# Patient Record
Sex: Female | Born: 1955 | Race: Black or African American | Hispanic: No | Smoking: Current every day smoker
Health system: Southern US, Community
[De-identification: ages and names within clinical notes are randomized; demographics above are authoritative.]

## PROBLEM LIST (undated history)

## (undated) DIAGNOSIS — R918 Other nonspecific abnormal finding of lung field: Secondary | ICD-10-CM

## (undated) DIAGNOSIS — Z951 Presence of aortocoronary bypass graft: Secondary | ICD-10-CM

## (undated) DIAGNOSIS — N1832 Chronic kidney disease, stage 3b: Secondary | ICD-10-CM

## (undated) DIAGNOSIS — E119 Type 2 diabetes mellitus without complications: Secondary | ICD-10-CM

## (undated) DIAGNOSIS — I10 Essential (primary) hypertension: Secondary | ICD-10-CM

## (undated) DIAGNOSIS — D649 Anemia, unspecified: Secondary | ICD-10-CM

## (undated) DIAGNOSIS — Z9289 Personal history of other medical treatment: Secondary | ICD-10-CM

## (undated) DIAGNOSIS — I509 Heart failure, unspecified: Secondary | ICD-10-CM

## (undated) DIAGNOSIS — I779 Disorder of arteries and arterioles, unspecified: Secondary | ICD-10-CM

## (undated) DIAGNOSIS — F32A Depression, unspecified: Secondary | ICD-10-CM

## (undated) DIAGNOSIS — G459 Transient cerebral ischemic attack, unspecified: Secondary | ICD-10-CM

## (undated) DIAGNOSIS — F329 Major depressive disorder, single episode, unspecified: Secondary | ICD-10-CM

## (undated) DIAGNOSIS — I272 Pulmonary hypertension, unspecified: Secondary | ICD-10-CM

## (undated) DIAGNOSIS — I2510 Atherosclerotic heart disease of native coronary artery without angina pectoris: Secondary | ICD-10-CM

## (undated) DIAGNOSIS — J189 Pneumonia, unspecified organism: Secondary | ICD-10-CM

## (undated) DIAGNOSIS — E7800 Pure hypercholesterolemia, unspecified: Secondary | ICD-10-CM

## (undated) DIAGNOSIS — T7840XA Allergy, unspecified, initial encounter: Secondary | ICD-10-CM

## (undated) DIAGNOSIS — J42 Unspecified chronic bronchitis: Secondary | ICD-10-CM

## (undated) HISTORY — DX: Other nonspecific abnormal finding of lung field: R91.8

## (undated) HISTORY — DX: Disorder of arteries and arterioles, unspecified: I77.9

## (undated) HISTORY — DX: Transient cerebral ischemic attack, unspecified: G45.9

## (undated) HISTORY — PX: CHOLECYSTECTOMY: SHX55

## (undated) HISTORY — DX: Pulmonary hypertension, unspecified: I27.20

## (undated) HISTORY — DX: Chronic kidney disease, stage 3b: N18.32

## (undated) HISTORY — DX: Allergy, unspecified, initial encounter: T78.40XA

---

## 1987-04-20 HISTORY — PX: TUBAL LIGATION: SHX77

## 1999-09-26 ENCOUNTER — Encounter: Payer: Self-pay | Admitting: Emergency Medicine

## 1999-09-26 ENCOUNTER — Emergency Department (HOSPITAL_COMMUNITY): Admission: EM | Admit: 1999-09-26 | Discharge: 1999-09-26 | Payer: Self-pay | Admitting: Emergency Medicine

## 1999-12-18 ENCOUNTER — Other Ambulatory Visit: Admission: RE | Admit: 1999-12-18 | Discharge: 1999-12-18 | Payer: Self-pay | Admitting: *Deleted

## 2001-01-08 ENCOUNTER — Emergency Department (HOSPITAL_COMMUNITY): Admission: EM | Admit: 2001-01-08 | Discharge: 2001-01-08 | Payer: Self-pay | Admitting: Emergency Medicine

## 2001-02-01 ENCOUNTER — Other Ambulatory Visit: Admission: RE | Admit: 2001-02-01 | Discharge: 2001-02-01 | Payer: Self-pay | Admitting: *Deleted

## 2001-06-08 ENCOUNTER — Emergency Department (HOSPITAL_COMMUNITY): Admission: EM | Admit: 2001-06-08 | Discharge: 2001-06-08 | Payer: Self-pay | Admitting: Emergency Medicine

## 2001-06-08 ENCOUNTER — Encounter: Payer: Self-pay | Admitting: Emergency Medicine

## 2001-11-20 ENCOUNTER — Encounter: Payer: Self-pay | Admitting: Emergency Medicine

## 2001-11-20 ENCOUNTER — Emergency Department (HOSPITAL_COMMUNITY): Admission: EM | Admit: 2001-11-20 | Discharge: 2001-11-20 | Payer: Self-pay | Admitting: Emergency Medicine

## 2001-12-13 ENCOUNTER — Encounter: Payer: Self-pay | Admitting: Pulmonary Disease

## 2001-12-13 ENCOUNTER — Encounter: Admission: RE | Admit: 2001-12-13 | Discharge: 2001-12-13 | Payer: Self-pay | Admitting: Pulmonary Disease

## 2002-04-05 ENCOUNTER — Other Ambulatory Visit: Admission: RE | Admit: 2002-04-05 | Discharge: 2002-04-05 | Payer: Self-pay | Admitting: Obstetrics and Gynecology

## 2003-04-20 DIAGNOSIS — Z9289 Personal history of other medical treatment: Secondary | ICD-10-CM

## 2003-04-20 HISTORY — DX: Personal history of other medical treatment: Z92.89

## 2003-05-07 ENCOUNTER — Other Ambulatory Visit: Admission: RE | Admit: 2003-05-07 | Discharge: 2003-05-07 | Payer: Self-pay | Admitting: Obstetrics and Gynecology

## 2004-02-17 ENCOUNTER — Inpatient Hospital Stay (HOSPITAL_COMMUNITY): Admission: EM | Admit: 2004-02-17 | Discharge: 2004-02-24 | Payer: Self-pay | Admitting: Family Medicine

## 2004-02-18 DIAGNOSIS — I2510 Atherosclerotic heart disease of native coronary artery without angina pectoris: Secondary | ICD-10-CM

## 2004-02-18 DIAGNOSIS — Z951 Presence of aortocoronary bypass graft: Secondary | ICD-10-CM

## 2004-02-18 HISTORY — DX: Atherosclerotic heart disease of native coronary artery without angina pectoris: I25.10

## 2004-02-18 HISTORY — DX: Presence of aortocoronary bypass graft: Z95.1

## 2004-02-18 HISTORY — PX: CORONARY ARTERY BYPASS GRAFT: SHX141

## 2004-02-19 ENCOUNTER — Encounter (INDEPENDENT_AMBULATORY_CARE_PROVIDER_SITE_OTHER): Payer: Self-pay | Admitting: *Deleted

## 2004-02-21 DIAGNOSIS — I2510 Atherosclerotic heart disease of native coronary artery without angina pectoris: Secondary | ICD-10-CM | POA: Diagnosis present

## 2004-02-29 ENCOUNTER — Emergency Department (HOSPITAL_COMMUNITY): Admission: AD | Admit: 2004-02-29 | Discharge: 2004-02-29 | Payer: Self-pay | Admitting: Family Medicine

## 2004-03-16 ENCOUNTER — Encounter (HOSPITAL_COMMUNITY): Admission: RE | Admit: 2004-03-16 | Discharge: 2004-06-14 | Payer: Self-pay | Admitting: Cardiology

## 2004-03-20 ENCOUNTER — Encounter: Admission: RE | Admit: 2004-03-20 | Discharge: 2004-03-20 | Payer: Self-pay | Admitting: Cardiothoracic Surgery

## 2004-06-15 ENCOUNTER — Encounter (HOSPITAL_COMMUNITY): Admission: RE | Admit: 2004-06-15 | Discharge: 2004-08-17 | Payer: Self-pay | Admitting: Cardiology

## 2004-06-18 ENCOUNTER — Other Ambulatory Visit: Admission: RE | Admit: 2004-06-18 | Discharge: 2004-06-18 | Payer: Self-pay | Admitting: Obstetrics and Gynecology

## 2004-11-23 ENCOUNTER — Encounter: Admission: RE | Admit: 2004-11-23 | Discharge: 2004-11-23 | Payer: Self-pay | Admitting: Pulmonary Disease

## 2005-03-04 ENCOUNTER — Other Ambulatory Visit: Admission: RE | Admit: 2005-03-04 | Discharge: 2005-03-04 | Payer: Self-pay | Admitting: Obstetrics and Gynecology

## 2005-03-30 ENCOUNTER — Encounter: Admission: RE | Admit: 2005-03-30 | Discharge: 2005-03-30 | Payer: Self-pay | Admitting: Gastroenterology

## 2005-04-21 ENCOUNTER — Other Ambulatory Visit: Admission: RE | Admit: 2005-04-21 | Discharge: 2005-04-21 | Payer: Self-pay | Admitting: Obstetrics and Gynecology

## 2005-05-12 ENCOUNTER — Encounter (INDEPENDENT_AMBULATORY_CARE_PROVIDER_SITE_OTHER): Payer: Self-pay | Admitting: *Deleted

## 2005-05-12 ENCOUNTER — Ambulatory Visit (HOSPITAL_COMMUNITY): Admission: RE | Admit: 2005-05-12 | Discharge: 2005-05-12 | Payer: Self-pay | Admitting: Gastroenterology

## 2006-08-24 ENCOUNTER — Emergency Department (HOSPITAL_COMMUNITY): Admission: EM | Admit: 2006-08-24 | Discharge: 2006-08-24 | Payer: Self-pay | Admitting: *Deleted

## 2006-09-19 ENCOUNTER — Other Ambulatory Visit: Admission: RE | Admit: 2006-09-19 | Discharge: 2006-09-19 | Payer: Self-pay | Admitting: Obstetrics and Gynecology

## 2007-01-11 ENCOUNTER — Encounter: Admission: RE | Admit: 2007-01-11 | Discharge: 2007-01-11 | Payer: Self-pay | Admitting: Pulmonary Disease

## 2007-08-02 ENCOUNTER — Emergency Department (HOSPITAL_COMMUNITY): Admission: EM | Admit: 2007-08-02 | Discharge: 2007-08-03 | Payer: Self-pay | Admitting: Emergency Medicine

## 2008-04-19 HISTORY — PX: CARDIAC CATHETERIZATION: SHX172

## 2008-08-14 ENCOUNTER — Encounter: Admission: RE | Admit: 2008-08-14 | Discharge: 2008-08-14 | Payer: Self-pay | Admitting: Gastroenterology

## 2008-11-11 ENCOUNTER — Emergency Department (HOSPITAL_COMMUNITY): Admission: EM | Admit: 2008-11-11 | Discharge: 2008-11-11 | Payer: Self-pay | Admitting: Emergency Medicine

## 2008-11-12 ENCOUNTER — Observation Stay (HOSPITAL_COMMUNITY): Admission: EM | Admit: 2008-11-12 | Discharge: 2008-11-13 | Payer: Self-pay | Admitting: Emergency Medicine

## 2009-08-24 ENCOUNTER — Emergency Department (HOSPITAL_COMMUNITY): Admission: EM | Admit: 2009-08-24 | Discharge: 2009-08-24 | Payer: Self-pay | Admitting: Emergency Medicine

## 2009-11-21 ENCOUNTER — Emergency Department (HOSPITAL_COMMUNITY): Admission: EM | Admit: 2009-11-21 | Discharge: 2009-11-21 | Payer: Self-pay | Admitting: Emergency Medicine

## 2010-03-25 ENCOUNTER — Ambulatory Visit: Payer: Self-pay | Admitting: Obstetrics & Gynecology

## 2010-04-23 ENCOUNTER — Ambulatory Visit
Admission: RE | Admit: 2010-04-23 | Discharge: 2010-04-23 | Payer: Self-pay | Source: Home / Self Care | Attending: Obstetrics and Gynecology | Admitting: Obstetrics and Gynecology

## 2010-04-24 ENCOUNTER — Encounter: Payer: Self-pay | Admitting: Physician Assistant

## 2010-04-24 LAB — CONVERTED CEMR LAB
Clue Cells Wet Prep HPF POC: NONE SEEN
Trich, Wet Prep: NONE SEEN

## 2010-04-29 ENCOUNTER — Ambulatory Visit (HOSPITAL_COMMUNITY)
Admission: RE | Admit: 2010-04-29 | Discharge: 2010-04-29 | Payer: Self-pay | Source: Home / Self Care | Attending: Family Medicine | Admitting: Family Medicine

## 2010-05-09 ENCOUNTER — Encounter: Payer: Self-pay | Admitting: Cardiothoracic Surgery

## 2010-05-10 ENCOUNTER — Encounter: Payer: Self-pay | Admitting: Pulmonary Disease

## 2010-07-07 LAB — POCT I-STAT, CHEM 8
BUN: 12 mg/dL (ref 6–23)
Calcium, Ion: 1.24 mmol/L (ref 1.12–1.32)
Chloride: 104 mEq/L (ref 96–112)
Creatinine, Ser: 0.8 mg/dL (ref 0.4–1.2)
Glucose, Bld: 304 mg/dL — ABNORMAL HIGH (ref 70–99)
HCT: 40 % (ref 36.0–46.0)
Hemoglobin: 13.6 g/dL (ref 12.0–15.0)
Potassium: 4.6 mEq/L (ref 3.5–5.1)
Sodium: 137 mEq/L (ref 135–145)
TCO2: 26 mmol/L (ref 0–100)

## 2010-07-07 LAB — DIFFERENTIAL
Basophils Absolute: 0 10*3/uL (ref 0.0–0.1)
Basophils Relative: 0 % (ref 0–1)
Eosinophils Absolute: 0.2 10*3/uL (ref 0.0–0.7)
Eosinophils Relative: 2 % (ref 0–5)
Lymphocytes Relative: 27 % (ref 12–46)
Lymphs Abs: 2.1 10*3/uL (ref 0.7–4.0)
Monocytes Absolute: 0.5 10*3/uL (ref 0.1–1.0)
Monocytes Relative: 6 % (ref 3–12)
Neutro Abs: 5 10*3/uL (ref 1.7–7.7)
Neutrophils Relative %: 64 % (ref 43–77)

## 2010-07-07 LAB — CBC
HCT: 36.8 % (ref 36.0–46.0)
Hemoglobin: 12.2 g/dL (ref 12.0–15.0)
MCHC: 33.2 g/dL (ref 30.0–36.0)
MCV: 87.2 fL (ref 78.0–100.0)
Platelets: 320 10*3/uL (ref 150–400)
RBC: 4.22 MIL/uL (ref 3.87–5.11)
RDW: 15.8 % — ABNORMAL HIGH (ref 11.5–15.5)
WBC: 7.8 10*3/uL (ref 4.0–10.5)

## 2010-07-26 LAB — POCT I-STAT, CHEM 8
Chloride: 102 mEq/L (ref 96–112)
Creatinine, Ser: 0.7 mg/dL (ref 0.4–1.2)
HCT: 40 % (ref 36.0–46.0)
Hemoglobin: 13.6 g/dL (ref 12.0–15.0)
Potassium: 4.4 mEq/L (ref 3.5–5.1)
Sodium: 136 mEq/L (ref 135–145)

## 2010-07-26 LAB — CBC
HCT: 38.3 % (ref 36.0–46.0)
HCT: 39 % (ref 36.0–46.0)
Hemoglobin: 12.1 g/dL (ref 12.0–15.0)
Hemoglobin: 12.4 g/dL (ref 12.0–15.0)
Hemoglobin: 12.6 g/dL (ref 12.0–15.0)
MCHC: 31.9 g/dL (ref 30.0–36.0)
MCHC: 32.9 g/dL (ref 30.0–36.0)
MCHC: 33.1 g/dL (ref 30.0–36.0)
MCV: 87.1 fL (ref 78.0–100.0)
MCV: 87.8 fL (ref 78.0–100.0)
MCV: 87.9 fL (ref 78.0–100.0)
Platelets: 294 K/uL (ref 150–400)
Platelets: 321 10*3/uL (ref 150–400)
RBC: 4.2 MIL/uL (ref 3.87–5.11)
RBC: 4.36 MIL/uL (ref 3.87–5.11)
RBC: 4.44 MIL/uL (ref 3.87–5.11)
RDW: 14.8 % (ref 11.5–15.5)
RDW: 14.9 % (ref 11.5–15.5)
WBC: 5.2 K/uL (ref 4.0–10.5)
WBC: 7.4 10*3/uL (ref 4.0–10.5)
WBC: 7.5 10*3/uL (ref 4.0–10.5)

## 2010-07-26 LAB — POCT CARDIAC MARKERS
CKMB, poc: <1 ng/mL — ABNORMAL LOW (ref 1.0–8.0)
Myoglobin, poc: 72.6 ng/mL (ref 12–200)
Troponin i, poc: <0.05 ng/mL (ref 0.00–0.09)

## 2010-07-26 LAB — DIFFERENTIAL
Basophils Absolute: 0 10*3/uL (ref 0.0–0.1)
Basophils Absolute: 0 K/uL (ref 0.0–0.1)
Basophils Relative: 1 % (ref 0–1)
Basophils Relative: 1 % (ref 0–1)
Eosinophils Absolute: 0.1 K/uL (ref 0.0–0.7)
Eosinophils Relative: 3 % (ref 0–5)
Lymphocytes Relative: 31 % (ref 12–46)
Lymphocytes Relative: 36 % (ref 12–46)
Lymphs Abs: 1.9 K/uL (ref 0.7–4.0)
Monocytes Absolute: 0.3 K/uL (ref 0.1–1.0)
Monocytes Absolute: 0.4 10*3/uL (ref 0.1–1.0)
Monocytes Relative: 6 % (ref 3–12)
Monocytes Relative: 6 % (ref 3–12)
Neutro Abs: 2.8 K/uL (ref 1.7–7.7)
Neutro Abs: 4.4 10*3/uL (ref 1.7–7.7)
Neutrophils Relative %: 55 % (ref 43–77)
Neutrophils Relative %: 60 % (ref 43–77)

## 2010-07-26 LAB — LIPID PANEL
HDL: 60 mg/dL
Total CHOL/HDL Ratio: 2.6 ratio
Triglycerides: 167 mg/dL — ABNORMAL HIGH
VLDL: 33 mg/dL (ref 0–40)

## 2010-07-26 LAB — D-DIMER, QUANTITATIVE

## 2010-07-26 LAB — CARDIAC PANEL(CRET KIN+CKTOT+MB+TROPI): Total CK: 66 U/L (ref 7–177)

## 2010-07-26 LAB — GLUCOSE, CAPILLARY
Glucose-Capillary: 125 mg/dL — ABNORMAL HIGH (ref 70–99)
Glucose-Capillary: 142 mg/dL — ABNORMAL HIGH (ref 70–99)
Glucose-Capillary: 151 mg/dL — ABNORMAL HIGH (ref 70–99)

## 2010-07-26 LAB — COMPREHENSIVE METABOLIC PANEL
ALT: 17 U/L (ref 0–35)
AST: 21 U/L (ref 0–37)
BUN: 8 mg/dL (ref 6–23)
Calcium: 9.4 mg/dL (ref 8.4–10.5)
Creatinine, Ser: 0.57 mg/dL (ref 0.4–1.2)
GFR calc non Af Amer: >60 mL/min (ref >60)
Glucose, Bld: 190 mg/dL — ABNORMAL HIGH (ref 70–99)
Potassium: 4.1 mEq/L (ref 3.5–5.1)
Total Bilirubin: 0.4 mg/dL (ref 0.3–1.2)
Total Protein: 6.8 g/dL (ref 6.0–8.3)

## 2010-07-26 LAB — CK TOTAL AND CKMB (NOT AT ARMC)
CK, MB: 0.8 ng/mL (ref 0.3–4.0)
Relative Index: INVALID (ref 0.0–2.5)
Total CK: 60 U/L (ref 7–177)

## 2010-07-26 LAB — PROTIME-INR
INR: 0.9 (ref 0.00–1.49)
Prothrombin Time: 12 seconds (ref 11.6–15.2)

## 2010-07-26 LAB — TROPONIN I: Troponin I: 0.02 ng/mL (ref 0.00–0.06)

## 2010-07-26 LAB — APTT: aPTT: 34 seconds (ref 24–37)

## 2010-07-26 LAB — TSH: TSH: 1.209 u[IU]/mL (ref 0.350–4.500)

## 2010-09-01 NOTE — Cardiovascular Report (Signed)
NAME:  Deborah Coffey, Deborah Coffey NO.:  [id number]   MEDICAL RECORD NO.:  [id number]          PATIENT TYPE:  INP   LOCATION:  2852                         FACILITY:  [facility]   PHYSICIAN:  Nicki Guadalajara, M.D.     DATE OF BIRTH:  February 23, 1956   DATE OF PROCEDURE:  11/13/2008  DATE OF DISCHARGE:  11/13/2008                            CARDIAC CATHETERIZATION   PROCEDURE:  Cardiac catheterization.   SURGEON:  Nicki Guadalajara, MD   INDICATIONS:  Ms. Deborah Coffey is a 55 year old African American  female who apparently underwent single-vessel CABG revascularization  surgery in November 2005 with a LIMA to the LAD.  At that time, cardiac  catheterization was done by Dr. Clarene Duke and the patient was found to have  high-grade LAD disease in the region of a large diagonal takeoff.  The  patient has a history of hypertension, diabetes mellitus, obesity, and  hyperlipidemia.  She has had recurrent episodes of somewhat atypical  chest pain.  She had been sent home with chest pain several days ago.  A  D-dimer was 1.1.  A chest CT was negative.  Troponins were negative.  She had recurrent episodes of chest pain leading to readmission to [facility] yesterday.  Because of recurrence of discomfort, she was  seen by Dr. Mariah Milling and definitive cardiac catheterization was  recommended.   PROCEDURE IN DETAIL:  After premedication with Versed 2 mg and fentanyl  50 mcg, the patient was prepped and draped in usual fashion.  A right  femoral artery was punctured anteriorly and a 5-French sheath was  inserted.  Diagnostic cardiac catheterization was done utilizing 5-  French catheters.  For optimal visualization of the left system, an FL-  3.5 catheter was necessary.  A 5-French FR-4 right catheter was used for  selective angiography in the right coronary artery.  A LIMA catheter was  used for selective angiography into the left internal mammary artery.  A  5-French pigtail catheter was used  for biplane cine left  ventriculography.  With the patient's significant hypertensive history,  distal aortography was also performed to make certain that she did not  have any renovascular etiology to her hypertension.  Hemostasis was  obtained by direct manual pressure.   HEMODYNAMIC DATA:  Central aortic pressure was 120/50.  Left ventricular  pressure was 120/7.   ANGIOGRAPHIC DATA:  Left main coronary artery was a short upper takeoff  vessel which bifurcated into an LAD and left circumflex system.  The LAD  was a large vessel that gave rise to a large proximal diagonal vessel.  In the region of the diagonal takeoff of the LAD just beyond this was,  was smooth and appeared 30% narrowed.  Apparently, this was a site which  previously was reported to be 95% stenosed.  A flush and fill phenomena  was seen in the distal LAD due to the competitive filling from the LIMA  graft.   The circumflex vessel was a moderate-sized vessel that gave rise to two  marginal vessels and ended in the posterolateral vessel.  The circumflex  was angiographically normal.   The right  coronary artery was a moderate-sized vessel that gave rise to  a moderate-sized PDA system and small distal RCA.  There was 40-50%  narrowing in the midportion of the right coronary artery.  Following 200  mcg of intracoronary nitroglycerin, this did not significantly improve.   The left internal mammary artery was a large-caliber vessel proximally.  A flush and fill phenomenon was seen in the distal portion of the LIMA  graft and because of this, visualization of the distal LAD was not  visualized by the LIMA graft but was visualized by the native left  injection.   Left ventriculography revealed normal LV contractility without focal  segmental wall motion abnormalities.  There was a suggestion of mild  concentric left ventricle hypertrophy.   Distal aortography did not demonstrate any significant aortoiliac  disease.   The renal arteries were patent without significant  abnormality.   IMPRESSION:  1. Normal left ventricular function with suggestion of mild concentric      left ventricular hypertrophy.  2. Mild native coronary obstructive disease with improvement in the      previous 95% proximal left anterior descending stenosis in the      region of the diagonal takeoff which now appeared only 30% with a      flush and fill phenomena seen in the distal left anterior      descending due to competitive left internal mammary artery filling.  3. Normal left circumflex system.  4. A 40-50% narrowing in the mid right coronary artery, not      significantly improved with intracoronary nitroglycerin.  5. Patent left internal mammary artery graft but evidence for flush      and fill phenomena seen in the distal graft without adequate      visualization of the distal left anterior descending via this graft      due to predominantly native left anterior descending filling.  6. Normal aortoiliac system.   RECOMMENDATIONS:  Medical therapy.           ______________________________  Nicki Guadalajara, M.D.     TK/MEDQ  D:  11/13/2008  T:  11/13/2008  Job:  [id number]   cc:   Mina Marble, M.D.  Ritta Slot, MD

## 2010-09-01 NOTE — H&P (Signed)
NAME:  Deborah Coffey, NAVEJAS NO.:  [id number]   MEDICAL RECORD NO.:  [id number]          PATIENT TYPE:  EMS   LOCATION:  MAJO                         FACILITY:  [facility]   PHYSICIAN:  Nicki Guadalajara, M.D.     DATE OF BIRTH:  14-Mar-1956   DATE OF ADMISSION:  11/12/2008  DATE OF DISCHARGE:                              HISTORY & PHYSICAL   CHIEF COMPLAINTS:  Chest pain.   Ms. Posada is a 55 year old female followed by Dr. Lynnea Ferrier and Dr.  Petra Kuba.  She has a history of coronary disease.  She had an urgent  bypass surgery times one November 2005, with an LIMA to her LAD.  Her  only functional study since then was an echocardiogram, which was done  in March 2009, which showed essentially normal LV function.  She does  have other risk factors including dyslipidemia, smoking, hypertension  and diabetes.  She was seen in the emergency room last night for chest  pain, which was atypical.  It was localized to her left chest.  It was  worse with palpation and deep breathing.  Her D-dimer was slightly  elevated at 1.0 and she had a CT scan which was negative for pulmonary  embolism.  Her troponins were negative x2.  She was treated with pain  medication and discharged from the emergency room.  She called the  office later today, saying she was still having left-sided chest pain  but also had some vague chest tightness.  She denies any shortness of  breath or diaphoresis.  She came back to the emergency room tonight  after calling our office.   CURRENT MEDICATIONS:  1. Lisinopril 10 mg a day.  2. Metoprolol 50 mg b.i.d.  3. Zocor 40 mg a day.  4. Aspirin 81 mg a day.   ALLERGIES:  CIPRO which caused a rash.   PAST MEDICAL HISTORY:  1. Type 2 non-insulin-dependent diabetes, treated.  2. Dyslipidemia.  3. Smoking.  4. Hypertension.  5. Obesity.   SOCIAL HISTORY:  She is a widow.  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  She smokes about half a pack a day.  She has  two  children and one grandchild.  She lives with her daughter.   FAMILY HISTORY:  Remarkable for coronary disease.   REVIEW OF SYSTEMS:  Remarkable that the patient's daughter has told her  she snores.  The patient has never had a sleep study.  The patient has  never had GI bleeding or melena.  She has not had kidney disease or  thyroid trouble.  She does wear glasses.   PHYSICAL EXAMINATION:  Blood pressure 105/69, pulse 77, temperature  98.8, respirations 12.  GENERAL:  She is a well-developed obese African American female in no  acute distress.  HEENT:  Normocephalic.  She wears glasses.  She has poor dentition.  NECK:  Without JVD or bruits.  Thyroid is not enlarged.  CHEST: Clear to auscultation.   IMPRESSION:  CARDIAC:  Reveals regular rate and rhythm without murmur,  rub or gallop.  Normal S1 and S2.  ABDOMEN:  Obese,  nontender.  No hepatosplenomegaly.  EXTREMITIES:  Without edema.  Distal pulses are 3+/4 bilaterally.  NEUROLOGIC:  Exam is grossly intact.  She is awake, alert, oriented and  cooperative.  Moves all extremities without obvious deficit.  SKIN:  Warm and dry.   LABS:  (From last night):  Sodium 136, potassium 4.4, BUN 15, creatinine  0.7, troponins were negative x2.  D-dimer was 1.17.  CT scan was  negative for pulmonary embolism.  White count 7.4, hemoglobin 12.4,  hematocrit 39, platelets 321.  EKG shows sinus rhythm without acute  changes.   IMPRESSION:  1. Chest pain:  Some typical and some atypical features.  Rule out      progression of coronary disease.  2. Known coronary disease:  With urgent coronary bypass grafting times      one, with a left internal mammary to left anterior descending      artery in November 2005.  3. Normal left ventricular function echocardiogram, March 2009.  4. Type 2 non-insulin-dependent diabetes.  5. Treated dyslipidemia.  6. Smoking.  7. Treated hypertension.  8. Morbid obesity, with suspected sleep apnea by history.   Currently      untreated.  9. Cipro allergy.   PLAN:  The patient will be admitted for 24-hour observation, for a rule  out of myocardial infarction.  If she rules out she will probably need  some type of functional study.      Abelino Derrick, P.A.    ______________________________  Nicki Guadalajara, M.D.    Lenard Lance  D:  11/12/2008  T:  11/12/2008  Job:  [id number]

## 2010-09-04 NOTE — Cardiovascular Report (Signed)
NAMEMarland Kitchen  CHINMAYI, RUMER NO.:  [id number]   MEDICAL RECORD NO.:  [id number]          PATIENT TYPE:  INP   LOCATION:  2022                         FACILITY:  [facility]   PHYSICIAN:  Thereasa Solo. Little, M.D. DATE OF BIRTH:  01/11/1956   DATE OF PROCEDURE:  02/19/2004  DATE OF DISCHARGE:                              CARDIAC CATHETERIZATION   This 55 year old hypertensive diabetic has had chest pain off and on for  about six months.  She was hospitalized on February 17, 2004, again with  chest pain.  Her cardiac enzymes have been unremarkable.  She is brought to  the cath lab for definitive evaluation of whether or not this is coronary  disease.   After obtaining informed consent, the patient was prepped and draped in the  usual sterile fashion exposing the right groin.  Following anesthetic with  1% Xylocaine, Seldinger technique was employed and a 5 [place] introducer  sheath was placed in the right femoral artery.  Left and right coronary  arteriography and ventriculography in the RAO projection was performed.   TOTAL CONTRAST USED:  120 mL.   EQUIPMENT:  5 French Judkins configuration catheters.   COMPLICATIONS:  The patient had intermittent chest pain throughout the  entire procedure.  Intercoronary nitroglycerin did not improve the chest  pain.   RESULTS:  1.  Hemodynamic monitoring:  Central aortic pressure 150/80, left      ventricular pressure 150/13 with no aortic valve gradient noted on pull      back.   1.  Ventriculography:  Ventriculography in the RAO projection using 25 mL of      contrast at 12 mL per second revealed the distal anterior wall and apex      to be hypokinetic.  The ejection fraction was 45%.  No mitral      regurgitation was seen.  End diastolic pressure was 17.   CORONARY ARTERIOGRAPHY:  1.  Left main normal.  2.  LAD:  The LAD extended down to the apex of the heart and gave rise to      one very large first diagonal branch.  Just after  this first diagonal      was a 95% eccentric area of narrowing in the LAD, itself.  It appeared      to only involve the LAD but there was no area after the diagonal take      off to consider doing an intervention without having to jail the      diagonal.  3.  Circumflex:  The circumflex gave rise to two OM vessels free of disease.  4.  Right coronary artery is normal with two small posterolateral branches      and a small PDA.   CONCLUSION:  1.  High grade stenosis in the LAD at the bifurcation of the diagonal making      percutaneous intervention potentially high risk since the diagonal was      so large.  2.  Mild distal septum and apical hypokinesis with ejection fraction of 45%.   The patient complained of chest pain again.  I went back in  and re-looked.  The lesion appeared to be unchanged.  Intercoronary nitroglycerin was given  with no change in the appearance.  The patient was placed on IV  nitroglycerin  at 6 and now her chest pain has dissipated.  I have asked [facility] to come and  see the patient for consideration for urgent revascularization.  I discussed  this case with Dr. Elsie Lincoln who felt that bypass surgery is the best  alternative in view of the location of the lesion.       ABL/MEDQ  D:  02/19/2004  T:  02/19/2004  Job:  [id number]   cc:   Eino Farber., M.D.  [address] [place]  [place] [address]  Fax: [phone]

## 2010-09-04 NOTE — Discharge Summary (Signed)
NAME:  DILLYN, JOAQUIN NO.:  [id number]   MEDICAL RECORD NO.:  [id number]          PATIENT TYPE:  INP   LOCATION:  2005                         FACILITY:  [facility]   PHYSICIAN:  Kerin Perna, M.D.  DATE OF BIRTH:  Sep 24, 1955   DATE OF ADMISSION:  02/17/2004  DATE OF DISCHARGE:  02/24/2004                                 DISCHARGE SUMMARY   ADMITTING DIAGNOSES:  Chest pain consistent with unstable angina.   PAST MEDICAL HISTORY:  Diabetes mellitus type 2.   ALLERGIES:  CIPRO (causes rash and itching).   DISCHARGE DIAGNOSES:  Severe one vessel coronary artery disease status post  coronary artery bypass.   BRIEF HISTORY:  Ms. Kroenke is a 55 year old African-American female.  She  presented to urgent care center with complaints of chest pain.  She was  referred to the [facility] for evaluation.  She was  evaluated there by Dr. Corine Shelter.  Ms. Radziewicz described  experiencing chest pain since July after the loss of her husband and  initially thought her symptoms were caused by anxiety but over the last few  months her chest pain increased in frequency and intensity.  After Dr.  Consuello Bossier evaluation in the emergency department he recommended  hospitalization as he felt her chest pain was consistent with unstable  angina.  Ms. Sherfield agreed with this plan.   HOSPITAL COURSE:  On February 17, 2004 Ms. Uber was admitted to [facility] under the care of Dr. Petra Kuba.  He requested cardiology  consultation and patient was evaluated by Dr. Allyson Sabal.  Although her physical  examination was benign except for a 2/6 outflow murmur, EKG was without  changes, Dr. Allyson Sabal felt her best option was to consider coronary angiography  to rule out LAD disease.  Ms. Swords agreed to proceed.  She underwent  cardiac catheterization on February 19, 2004.  Cardiac catheterization  demonstrated an ostial 95% LAD stenosis.  She developed some  chest pain in  the cardiac catheterization laboratory and it was felt she required urgent  surgical coronary revascularization.  Cardiac surgery consultation was  requested and she was evaluated immediately by Dr. Kathlee Nations Trigt.  After  examination of the patient Dr. Donata Clay agreed that proceeding with urgent  coronary artery bypass was preferred treatment choice for this woman.  Procedure risks and benefits were discussed and Ms. Nierman agreed to  proceed with surgery.  She was then taken directly to the operating room and  underwent the following surgical procedure:  Emergent coronary artery bypass  grafting x1.  Grafts placed:  Left internal mammary artery to the left  anterior descending artery.  She tolerated this procedure well, transferring  in stable condition to the SICU.  She remained hemodynamically stable in the  immediate postoperative period.  She was extubated on the early morning  hours of postoperative day one.  She woke from anesthesia neurologically  intact.  She was transfused for a hemoglobin of 8.2.  Ms. Rumple required  only routine care in the intensive care unit and was ready for transfer to  unit 2000 on  postoperative day two.   Ms. Birchard was seen by the smoking cessation counselor.  Ms. Printup  reports that she plans to stay quit after discharge.  She will be followed  in the outpatient setting via telephone calls.   Ms. Hutt postoperative course has been uneventful and overall she is  making very good progress in recovering from her surgery.  This morning,  February 23, 2004 postoperative day four on morning rounds she reports  feeling fairly well.  Her vital signs are stable with a blood pressure  120/70.  She is afebrile.  Her room air saturation is 94%.  Her heart is  maintaining a normal sinus rhythm, 91 beats a minute.  Her lungs are clear  to auscultation today.  She is tolerating her diet without nausea.  Her  bowel and bladder  functions are within normal limits for her.  Her incision  is healing very well.  She does have mild lower extremity edema.  Her weight  is 236.4 pounds today.  Her preoperative weight was 216.  She is ambulating  frequently in the hallway with minimal assistance.  Her pain is well  controlled.  Ms. Caraveo's diabetes was managed with the Glucommander  protocol in the immediate perioperative period and was treated with Lantus  insulin postoperative day one and two.  She has been resumed on her home  oral diabetes medications.  Her blood sugars have been within reasonable  control.  If Ms. Dioguardi continues to progress in this manner it is  anticipated she will be ready for discharge home tomorrow, February 24, 2004.   LABORATORIES:  November 6 CBC:  White blood cells 11.6, hemoglobin 9.9,  hematocrit 29.4, platelets 249.  Chemistries on November 6 included sodium  136, potassium 3.6, BUN 10, creatinine 0.7, glucose 130.  BNP will be  rechecked on the morning of November 7 as she is receiving diuretic therapy.   CONDITION ON DISCHARGE:  Improved.   DISCHARGE INSTRUCTIONS:   MEDICATIONS:  1.  Enteric-coated aspirin 325 mg daily.  2.  Lopressor 50 mg b.i.d.  3.  Zocor 20 mg daily.  4.  Lasix 40 mg daily for the next five days.  5.  Potassium chloride 20 mEq daily for the next five days.  6.  Glucotrol 10 mg daily.  7.  Glucophage 500 mg b.i.d.   PAIN MANAGEMENT:  She may have Ultram 50 mg one to two p.o. q.4h. for  moderate to severe pain or Tylenol 325 mg one to two p.o. q.4h. for mild  pain.   ACTIVITY:  She has been asked to refrain from any driving or heavy lifting,  pushing, or pulling.  Also instructed to continue her breathing exercises  and daily walking.  She has been advised to refrain from smoking cigarettes.   DIET:  Should continue to be a carbohydrate modified medium calorie diet.   WOUND CARE:  She is to shower daily with mild soap and water.  If incision shows  any sign of infection she is to call Dr. Zenaida Niece Trigt's office.   FOLLOWUP:  She is to be seen at the [facility]  by Dr. Clarene Duke in approximately two weeks.  She has been asked to call to  arrange that appointment.  Dr. Donata Clay would like to see her back at the  CVTS office in approximately three weeks.  She will be asked to get a chest  x-ray at [facility] one hour prior to that appointment.  The office will call her  with a date and time for these appointments.      Claudett   CTK/MEDQ  D:  02/23/2004  T:  02/23/2004  Job:  [id number]   cc:   Clarene Duke, M.D.   Eino Farber., M.D.  [address] [place]  [place] [address]  Fax: [phone]

## 2010-09-04 NOTE — Op Note (Signed)
NAME:  Deborah Coffey, Deborah Coffey NO.:  [id number]   MEDICAL RECORD NO.:  [id number]          PATIENT TYPE:  INP   LOCATION:  2316                         FACILITY:  [facility]   PHYSICIAN:  Kathlee Nations Trigt III, M.D.DATE OF BIRTH:  October 25, 1955   DATE OF PROCEDURE:  02/19/2004  DATE OF DISCHARGE:                                 OPERATIVE REPORT   PROCEDURE:  Emergency coronary artery bypass grafting (left internal mammary  artery to LAD).   PREOPERATIVE DIAGNOSIS:  95% ostial stenosis of the LAD with unstable  angina.   POSTOPERATIVE DIAGNOSIS:  95% ostial stenosis of the LAD with unstable  angina.   SURGEON:  Kerin Perna, M.D.   ASSISTANT:  Sheliah Plane, M.D.   ANESTHESIA:  General.   INDICATIONS FOR PROCEDURE:  The patient is a 55 year old female who  presented with exertional symptoms of chest pain and shortness of breath.  Cardiac catheterization demonstrated an ostial 95% LAD stenosis.  She  developed some chest pain in the cardiac catheterization lab.  It was felt  that she needed an urgent surgical coronary revascularization.  I examined  the patient in the cardiac catheterization lab prior to surgery and reviewed  the situation with her.  I discussed the indications, expected benefits, and  alternatives to emergency coronary artery bypass graft surgery for treatment  of her coronary artery disease.  I reviewed the risks associated with  coronary artery bypass graft surgery to her including the risks of MI, CVA,  bleeding, blood transfusion requirement and effects, and death.  She  understood these implications for the surgery and agreed to proceed with the  operation as planned under what I felt was an informed consent.   DESCRIPTION OF PROCEDURE:  The patient was brought directly from the cardiac  catheterization lab to the operating room.  General anesthesia was induced  under invasive hemodynamic monitoring and the chest, abdomen, and legs were  prepped  with Betadine, and draped as a sterile field.  A sternal incision  was made.  The left internal mammary artery was harvested as a pedicle graft  from its origin at the subclavian vessels.  It was a small vessel, but had  excellent flow.  Heparin was administered and the ACT was documented as  being therapeutic.  The sternal retractor was placed.  The pericardium was  opened and suspended.  Pursestrings were placed in the ascending aorta and  right atrium and the patient was cannulated and placed on bypass after the  ACT was documented as being therapeutic.  The LAD was identified for  grafting.  The heart was inspected and found to have no areas of scarring or  fibrosis.  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  The aortic crossclamp was  applied.  800 mL of cold blood cardioplegia was delivered to the coronaries  with immediate cardioplegic arrest.  Septal temperature dropped to less than  14 degrees.   The distal coronary anastomosis was then performed.  The left internal  mammary artery pedicle was brought through an opening created in the left  lateral pericardium.  It was brought down onto the LAD at the mid septum.  The LAD was a 1.5 mm vessel.  An end-to-side anastomosis with running 8-0  Prolene was constructed and there was excellent flow through the anastomosis  after release of the pedicle clamp on the mammary artery.  The mammary  pedicle was secured to the epicardium and the aortic crossclamp was removed.   The heart resumed a spontaneous rhythm.  The patient was rewarmed to 37  degrees.  Temporary pacing wires were applied.  The lungs reexpanded and the  ventilator was resumed.  When the patient reached 37 degrees, she was weaned  from bypass without difficulty.  Cardiac output and blood pressure were  stable.  Protamine was administered without adverse reaction.  The  mediastinum was irrigated with warm  antibiotic irrigation.  The superior  pericardial fat was closed over the aorta and right ventricle.  Two  mediastinal and a left pleural chest tube were placed and brought out  through separate incisions.  The sternum was closed with interrupted steel  wire.  The pectoralis fascia was closed with a running #1 Vicryl.  The  subcutaneous tissue and skin were closed with a running Vicryl and sterile  dressings were applied.  Total bypass time was 45 minutes with a crossclamp  time of 15 minutes.      Pete   PV/MEDQ  D:  02/20/2004  T:  02/21/2004  Job:  [id number]   cc:   Thereasa Solo. Little, M.D.  [address]  [address]  [place]  [place] [address]  Fax: [phone]   [facility]

## 2010-09-04 NOTE — Discharge Summary (Signed)
NAME:  Deborah Coffey, Deborah Coffey NO.:  [id number]   MEDICAL RECORD NO.:  [id number]          PATIENT TYPE:  INP   LOCATION:  2852                         FACILITY:  [facility]   PHYSICIAN:  Nicki Guadalajara, M.D.     DATE OF BIRTH:  31-Jul-1955   DATE OF ADMISSION:  11/12/2008  DATE OF DISCHARGE:  11/13/2008                               DISCHARGE SUMMARY   DISCHARGE DIAGNOSES:  1. Chest pain, not coronary or ischemic related, status post cardiac      catheterization without progressive disease.  2. History of coronary bypass grafting x1, November 2005.  3. Non-insulin-dependent diabetes mellitus.  4. Hypertension.  5. Smoking tobacco.  6. Obesity with sleep apnea by history.   LABORATORY DATA:  Hemoglobin 12.1, hematocrit 7.5, platelets 284.  Sodium 137, potassium 4.1, BUN 8, creatinine 0.57, albumin 3.3,  hemoglobin A1c was 11.7.  CK-MBs and troponins were negative.  Total  cholesterol is 153, triglycerides 167, HDL 60, and LDL was 16.  TSH was  1.209.   DISCHARGE MEDICATIONS:  1. Aspirin 81 mg a day.  2. Lisinopril 10 mg twice a day.  3. Simvastatin 40 mg daily.  4. Metoprolol 25 mg twice per day.  5. NovoLog 24 units of insulin subcu every a.m., NovoLog insulin at      night before dinner, sliding scale.  6. Prilosec over-the-counter 20 mg every day.  7. Advil 200 mg 2 every 8 hours as needed.  8. He can resume his metformin on November 15, 2008, at 10 a.m.   HOSPITAL COURSE:  Mr. Kaplan came into the hospital with complaints of  chest pain.  He was admitted to rule out an MI.  He was placed on IV  heparin.  His enzymes were negative.  The following day, he underwent  cardiac catheterization by Dr. Nicki Guadalajara.  His LIMA to his LAD was  patent.  He had a 40-50% in his RCA and 30% in LAD, thus his pain was  not thought to be related to any ischemia.  He is considered stable for  discharge home after his bedrest was up.      Lezlie Octave, N.P.    ______________________________  Nicki Guadalajara, M.D.   BB/MEDQ  D:  11/25/2008  T:  11/26/2008  Job:  [id number]   cc:   Mina Marble, M.D.

## 2010-09-04 NOTE — Op Note (Signed)
NAME:  Deborah Coffey, Deborah Coffey NO.:  [id number]   MEDICAL RECORD NO.:  [id number]          PATIENT TYPE:  AMB   LOCATION:  ENDO                         FACILITY:  [facility]   PHYSICIAN:  Anselmo Rod, M.D.  DATE OF BIRTH:  Jun 03, 1955   DATE OF PROCEDURE:  05/12/2005  DATE OF DISCHARGE:                                 OPERATIVE REPORT   PROCEDURE PERFORMED:  Colonoscopy with snare polypectomy x 2 and cold  biopsies x 4.   ENDOSCOPIST:  Charna Elizabeth, M.D.   INSTRUMENT USED:  Olympus video colonoscope.   INDICATIONS FOR PROCEDURE:  A 55 year old African-American female with a  history of change in bowel habits undergoing screening colonoscopy to rule  out colonic polyps, masses, etc.   PREPROCEDURE PREPARATION:  Informed consent was procured from the patient.  The patient was fasted for four hours prior to the procedure and prepped  OsmoPrep pills the night prior to and the morning of the procedure.  The  risks and benefits of the procedure including a 10% miss rate for polyps or  cancers was discussed with the patient as well.   PREPROCEDURE PHYSICAL:  The patient had stable vital signs.  Neck supple.  Chest clear to auscultation.  S1 and S2 regular.  Abdomen soft with normal  bowel sounds.   DESCRIPTION OF PROCEDURE:  The patient was placed in left lateral decubitus  position and sedated with 75 mcg of fentanyl and 7.5 mg of Versed in slow  incremental doses.  Once the patient was adequately sedated and maintained  on low flow oxygen and continuous cardiac monitoring, the Olympus video  colonoscope was advanced from the rectum to the cecum.  The appendicular  orifice and ileocecal valve were clearly visualized and photographed.  There  was a significant amount of stool in several of the right-sided diverticula.  Two small sessile polyps were removed by cold snare from the cecum and the  proximal right colon respectively.  Terminal ileum appeared healthy and  without  lesions.  The patient had four small sessile polyps removed by cold  biopsy from the rectosigmoid colon.  Retroflexion in the rectum revealed  small internal hemorrhoids.  The patient tolerated the procedure well  without immediate complication.  Multiple washes were done.  There was some  residual stool in the colon.   IMPRESSION:  1.Small nonbleeding internal hemorrhoids.  2.Four small sessile polyps were removed by cold biopsy from the  rectosigmoid colon.  3.Two small sessile polyps removed by cold snare from the cecum and proximal  right colon, respectively.  4.Pandiverticulosis with more prominent changes in the right colon.  Stool  in several of the right colonic diverticula noted.  5.Normal terminal ileum.   RECOMMENDATIONS:  1.Await pathology results.  2.Avoid all nonsteroidals including aspirin for the next two weeks.  3.Repeat colonoscopy depending on pathology results.  4.Brochures on diverticulosis and a high fiber diet have been given to the  patient for her education.  5.Outpatient followup in the next two weeks or earlier if need be.      Anselmo Rod, M.D.  Electronically Signed  JNM/MEDQ  D:  05/12/2005  T:  05/12/2005  Job:  [id number]   cc:   Edwena Felty. Romine, M.D.  Fax: [phone]   Mina Marble, M.D.  Fax: [phone]

## 2010-09-20 ENCOUNTER — Encounter (HOSPITAL_COMMUNITY): Payer: Self-pay | Admitting: Radiology

## 2010-09-20 ENCOUNTER — Emergency Department (HOSPITAL_COMMUNITY): Payer: PRIVATE HEALTH INSURANCE

## 2010-09-20 ENCOUNTER — Emergency Department (HOSPITAL_COMMUNITY)
Admission: EM | Admit: 2010-09-20 | Discharge: 2010-09-20 | Disposition: A | Discharge status: Home / Self Care | Payer: PRIVATE HEALTH INSURANCE | Attending: Emergency Medicine | Admitting: Emergency Medicine

## 2010-09-20 ENCOUNTER — Inpatient Hospital Stay (INDEPENDENT_AMBULATORY_CARE_PROVIDER_SITE_OTHER)
Admission: RE | Admit: 2010-09-20 | Discharge: 2010-09-20 | Disposition: A | Discharge status: Home / Self Care | Payer: PRIVATE HEALTH INSURANCE | Source: Ambulatory Visit | Attending: Emergency Medicine | Admitting: Emergency Medicine

## 2010-09-20 DIAGNOSIS — R109 Unspecified abdominal pain: Secondary | ICD-10-CM

## 2010-09-20 DIAGNOSIS — I10 Essential (primary) hypertension: Secondary | ICD-10-CM | POA: Insufficient documentation

## 2010-09-20 DIAGNOSIS — Z794 Long term (current) use of insulin: Secondary | ICD-10-CM | POA: Insufficient documentation

## 2010-09-20 DIAGNOSIS — I2510 Atherosclerotic heart disease of native coronary artery without angina pectoris: Secondary | ICD-10-CM | POA: Insufficient documentation

## 2010-09-20 DIAGNOSIS — R1033 Periumbilical pain: Secondary | ICD-10-CM | POA: Insufficient documentation

## 2010-09-20 DIAGNOSIS — R7989 Other specified abnormal findings of blood chemistry: Secondary | ICD-10-CM

## 2010-09-20 DIAGNOSIS — K5900 Constipation, unspecified: Secondary | ICD-10-CM | POA: Insufficient documentation

## 2010-09-20 DIAGNOSIS — Z7982 Long term (current) use of aspirin: Secondary | ICD-10-CM | POA: Insufficient documentation

## 2010-09-20 DIAGNOSIS — R1110 Vomiting, unspecified: Secondary | ICD-10-CM | POA: Insufficient documentation

## 2010-09-20 DIAGNOSIS — K8020 Calculus of gallbladder without cholecystitis without obstruction: Secondary | ICD-10-CM | POA: Insufficient documentation

## 2010-09-20 DIAGNOSIS — Z79899 Other long term (current) drug therapy: Secondary | ICD-10-CM | POA: Insufficient documentation

## 2010-09-20 DIAGNOSIS — E119 Type 2 diabetes mellitus without complications: Secondary | ICD-10-CM | POA: Insufficient documentation

## 2010-09-20 LAB — POCT I-STAT, CHEM 8
BUN: 12 mg/dL (ref 6–23)
Chloride: 103 mEq/L (ref 96–112)
Creatinine, Ser: 0.8 mg/dL (ref 0.4–1.2)
Glucose, Bld: 219 mg/dL — ABNORMAL HIGH (ref 70–99)
Hemoglobin: 14.3 g/dL (ref 12.0–15.0)
Potassium: 4.3 mEq/L (ref 3.5–5.1)
Sodium: 139 mEq/L (ref 135–145)

## 2010-09-20 LAB — COMPREHENSIVE METABOLIC PANEL
AST: 18 U/L (ref 0–37)
Albumin: 3.5 g/dL (ref 3.5–5.2)
BUN: 11 mg/dL (ref 6–23)
CO2: 27 mEq/L (ref 19–32)
Calcium: 9.6 mg/dL (ref 8.4–10.5)
Chloride: 100 mEq/L (ref 96–112)
Creatinine, Ser: 0.62 mg/dL (ref 0.4–1.2)
GFR calc Af Amer: >60 mL/min (ref >60)
GFR calc non Af Amer: >60 mL/min (ref >60)
Total Bilirubin: 0.2 mg/dL — ABNORMAL LOW (ref 0.3–1.2)

## 2010-09-20 LAB — POCT URINALYSIS DIP (DEVICE)
Bilirubin Urine: NEGATIVE
Ketones, ur: NEGATIVE mg/dL
Protein, ur: 30 mg/dL — AB
Specific Gravity, Urine: 1.02 (ref 1.005–1.030)
pH: 5 (ref 5.0–8.0)

## 2010-09-20 LAB — DIFFERENTIAL
Basophils Relative: 0 % (ref 0–1)
Eosinophils Absolute: 0.1 10*3/uL (ref 0.0–0.7)
Lymphs Abs: 2.7 10*3/uL (ref 0.7–4.0)
Monocytes Absolute: 0.5 10*3/uL (ref 0.1–1.0)
Monocytes Relative: 8 % (ref 3–12)

## 2010-09-20 LAB — URINALYSIS, ROUTINE W REFLEX MICROSCOPIC
Bilirubin Urine: NEGATIVE
Glucose, UA: NEGATIVE mg/dL
Hgb urine dipstick: NEGATIVE
Ketones, ur: NEGATIVE mg/dL
Protein, ur: NEGATIVE mg/dL
Urobilinogen, UA: 0.2 mg/dL (ref 0.0–1.0)

## 2010-09-20 LAB — CBC
MCH: 26.9 pg (ref 26.0–34.0)
MCHC: 32.6 g/dL (ref 30.0–36.0)
MCV: 82.6 fL (ref 78.0–100.0)
Platelets: 316 10*3/uL (ref 150–400)
RBC: 4.49 MIL/uL (ref 3.87–5.11)

## 2010-09-20 MED ORDER — IOHEXOL 300 MG/ML  SOLN
115.0000 mL | Freq: Once | INTRAMUSCULAR | Status: AC | PRN
  Administered 2010-09-20: 115 mL via INTRAVENOUS

## 2010-10-03 ENCOUNTER — Inpatient Hospital Stay (INDEPENDENT_AMBULATORY_CARE_PROVIDER_SITE_OTHER)
Admission: RE | Admit: 2010-10-03 | Discharge: 2010-10-03 | Disposition: A | Discharge status: Home / Self Care | Payer: PRIVATE HEALTH INSURANCE | Source: Ambulatory Visit | Attending: Family Medicine | Admitting: Family Medicine

## 2010-10-03 DIAGNOSIS — K8020 Calculus of gallbladder without cholecystitis without obstruction: Secondary | ICD-10-CM

## 2010-10-03 LAB — CBC
HCT: 36.3 % (ref 36.0–46.0)
Hemoglobin: 12.3 g/dL (ref 12.0–15.0)
RBC: 4.42 MIL/uL (ref 3.87–5.11)
WBC: 7.1 10*3/uL (ref 4.0–10.5)

## 2010-10-03 LAB — DIFFERENTIAL
Basophils Absolute: 0 10*3/uL (ref 0.0–0.1)
Basophils Relative: 0 % (ref 0–1)
Lymphocytes Relative: 42 % (ref 12–46)
Neutro Abs: 3.4 10*3/uL (ref 1.7–7.7)
Neutrophils Relative %: 48 % (ref 43–77)

## 2010-10-05 ENCOUNTER — Encounter (INDEPENDENT_AMBULATORY_CARE_PROVIDER_SITE_OTHER): Payer: Self-pay | Admitting: Surgery

## 2010-10-23 ENCOUNTER — Other Ambulatory Visit (INDEPENDENT_AMBULATORY_CARE_PROVIDER_SITE_OTHER): Payer: Self-pay | Admitting: Surgery

## 2010-10-23 ENCOUNTER — Encounter (HOSPITAL_COMMUNITY): Payer: PRIVATE HEALTH INSURANCE

## 2010-10-23 ENCOUNTER — Ambulatory Visit (HOSPITAL_COMMUNITY)
Admission: RE | Admit: 2010-10-23 | Discharge: 2010-10-23 | Disposition: A | Discharge status: Home / Self Care | Payer: PRIVATE HEALTH INSURANCE | Source: Ambulatory Visit | Attending: Surgery | Admitting: Surgery

## 2010-10-23 DIAGNOSIS — Z79899 Other long term (current) drug therapy: Secondary | ICD-10-CM | POA: Insufficient documentation

## 2010-10-23 DIAGNOSIS — I10 Essential (primary) hypertension: Secondary | ICD-10-CM | POA: Insufficient documentation

## 2010-10-23 DIAGNOSIS — Z01818 Encounter for other preprocedural examination: Secondary | ICD-10-CM

## 2010-10-23 DIAGNOSIS — I517 Cardiomegaly: Secondary | ICD-10-CM | POA: Insufficient documentation

## 2010-10-23 DIAGNOSIS — K8020 Calculus of gallbladder without cholecystitis without obstruction: Secondary | ICD-10-CM | POA: Insufficient documentation

## 2010-10-23 DIAGNOSIS — Z01812 Encounter for preprocedural laboratory examination: Secondary | ICD-10-CM | POA: Insufficient documentation

## 2010-10-23 LAB — DIFFERENTIAL
Eosinophils Absolute: 0.1 10*3/uL (ref 0.0–0.7)
Eosinophils Relative: 2 % (ref 0–5)
Lymphocytes Relative: 34 % (ref 12–46)
Lymphs Abs: 2.5 10*3/uL (ref 0.7–4.0)
Monocytes Absolute: 0.4 10*3/uL (ref 0.1–1.0)
Monocytes Relative: 5 % (ref 3–12)

## 2010-10-23 LAB — CBC
HCT: 37.7 % (ref 36.0–46.0)
Hemoglobin: 12 g/dL (ref 12.0–15.0)
MCH: 26.6 pg (ref 26.0–34.0)
MCV: 83.6 fL (ref 78.0–100.0)
Platelets: 307 10*3/uL (ref 150–400)
RBC: 4.51 MIL/uL (ref 3.87–5.11)
WBC: 7.4 10*3/uL (ref 4.0–10.5)

## 2010-10-23 LAB — COMPREHENSIVE METABOLIC PANEL
ALT: 27 U/L (ref 0–35)
AST: 26 U/L (ref 0–37)
CO2: 26 mEq/L (ref 19–32)
Chloride: 102 mEq/L (ref 96–112)
Creatinine, Ser: 0.68 mg/dL (ref 0.50–1.10)
GFR calc Af Amer: >60 mL/min (ref >60)
GFR calc non Af Amer: >60 mL/min (ref >60)
Glucose, Bld: 137 mg/dL — ABNORMAL HIGH (ref 70–99)
Total Bilirubin: 0.2 mg/dL — ABNORMAL LOW (ref 0.3–1.2)

## 2010-10-23 LAB — URINALYSIS, ROUTINE W REFLEX MICROSCOPIC
Glucose, UA: NEGATIVE mg/dL
Hgb urine dipstick: NEGATIVE
Ketones, ur: NEGATIVE mg/dL
Leukocytes, UA: NEGATIVE
pH: 5 (ref 5.0–8.0)

## 2010-10-23 LAB — SURGICAL PCR SCREEN: Staphylococcus aureus: POSITIVE — AB

## 2010-10-28 ENCOUNTER — Other Ambulatory Visit (INDEPENDENT_AMBULATORY_CARE_PROVIDER_SITE_OTHER): Payer: Self-pay | Admitting: Surgery

## 2010-10-28 ENCOUNTER — Ambulatory Visit (HOSPITAL_COMMUNITY)
Admission: RE | Admit: 2010-10-28 | Discharge: 2010-10-28 | Disposition: A | Discharge status: Home / Self Care | Payer: PRIVATE HEALTH INSURANCE | Source: Ambulatory Visit | Attending: Surgery | Admitting: Surgery

## 2010-10-28 DIAGNOSIS — K8010 Calculus of gallbladder with chronic cholecystitis without obstruction: Secondary | ICD-10-CM | POA: Insufficient documentation

## 2010-10-28 DIAGNOSIS — Z01812 Encounter for preprocedural laboratory examination: Secondary | ICD-10-CM | POA: Insufficient documentation

## 2010-10-29 LAB — GLUCOSE, CAPILLARY

## 2010-11-13 NOTE — Op Note (Signed)
NAME:  Deborah Coffey, DAME NO.:  [id number]  MEDICAL RECORD NO.:  [id number]  LOCATION:  DAYL                         FACILITY:  [facility]  PHYSICIAN:  Currie Paris, M.D.DATE OF BIRTH:  Aug 07, 1955  DATE OF PROCEDURE:  10/28/2010 DATE OF DISCHARGE:  10/28/2010                              OPERATIVE REPORT   PREOPERATIVE DIAGNOSIS:  Chronic calculus cholecystitis.  POSTOPERATIVE DIAGNOSIS:  Chronic calculus cholecystitis.  PROCEDURE:  Laparoscopic cholecystectomy.  SURGEON:  Currie Paris, MD  ANESTHESIA:  General.  CLINICAL HISTORY:  This is a 55 year old who has been having biliary type symptoms and finding of gallstones.  She is diabetic and had a history of coronary artery disease, but was felt stable for surgery.  DESCRIPTION OF PROCEDURE:  I saw the patient in the holding area.  She had no further questions.  She confirmed the plans for the procedure as noted above.  The patient was taken to the operating room after satisfactory general endotracheal anesthesia had been obtained.  The abdomen was prepped and draped.  The time-out was done.  A 0.25% plain Marcaine for each incision.  The umbilical incision was made.  The fascia entered and the peritoneal cavity entered under direct vision.  A pursestring was placed, the Hasson was introduced and the abdomen insufflated to 15.  The patient was placed in reverse Trendelenburg and tilted to the left. Under direct vision, a 10/11 trocar was placed in the epigastrium, and two 5s were placed laterally.  There were few omental adhesions below the umbilicus, but did not appear to involve bowel.  The gallbladder was fairly large with few omental adhesions on it, but not inflamed.  I tried to retract the gallbladder over the liver, but could not get good retraction, but I was able to take the adhesions down.  The gallbladder was very long and I was initially able to find the ampulla and retract on  that identified the cystic duct and get around that a dissected that and had a nice long segment of cystic duct.  The duct was fairly tiny.  I put a clip on it and tried to a cholangiogram, but I could not the catheter to thread it, as the duct was too small.  I then went ahead and put 3 clips on the stay side and divided it.  The perineum was freed up a little bit more until I could identify the artery, put clips on that and divided and then removed the gallbladder from below to above.  There was a fairly broad base intrahepatic gallbladder and it was somewhat of a raw surface, but there was almost no bleeding during the procedure.  Once the gallbladder was disconnected, I put that it into a bag.  I irrigated and made sure everything was dry.  Because of large surface, I put some SNOW in.  The gallbladder was removed out through the umbilical site.  The lateral ports removed under direct vision.  Final checks were made for hemostasis and the abdomen deflated through the epigastric port.  I closed the umbilical site with a pursestring.  Skin was closed with 4-0 Monocryl subcuticular and Dermabond.  The patient tolerated procedure well and there were  no complications. All counts were correct.     Currie Paris, M.D.     CJS/MEDQ  D:  10/28/2010  T:  10/28/2010  Job:  [id number]  cc:   Merlene Laughter. Renae Gloss, M.D.  Electronically Signed by Cyndia Bent M.D. on 11/13/2010 07:21:34 AM

## 2010-11-20 ENCOUNTER — Ambulatory Visit (INDEPENDENT_AMBULATORY_CARE_PROVIDER_SITE_OTHER): Payer: PRIVATE HEALTH INSURANCE | Admitting: Surgery

## 2010-11-20 ENCOUNTER — Encounter (INDEPENDENT_AMBULATORY_CARE_PROVIDER_SITE_OTHER): Payer: Self-pay | Admitting: Surgery

## 2010-11-20 VITALS — BP 140/70 | HR 80 | Temp 98.0°F

## 2010-11-20 DIAGNOSIS — Z9889 Other specified postprocedural states: Secondary | ICD-10-CM

## 2010-11-20 NOTE — Progress Notes (Signed)
CC: Postop laparoscopic cholecystectomy  HPI: This patient comes in for post op follow-up. She underwent arthroscopic cholecystectomy on July 11.. She feels that she is doing well. All of her preoperative symptoms have resolved. She feels very pleased with the results.  PE: General: The patient appears to be healthy, NAD  The abdomen is soft and benign. All incisions are healing nicely.  DATA REVIWED: Pathology report was reviewed. She has chronic cholecystitis and cholelithiasis  IMPRESSION: The patient is doing well S/P laparoscopic cholecystectomy.  Marland Kitchen  PLAN: Recheck here p.r.n. She can have normal activities.

## 2010-11-20 NOTE — Patient Instructions (Signed)
Come back if any problems occur. You may have normal activities

## 2010-11-21 ENCOUNTER — Emergency Department (HOSPITAL_COMMUNITY): Payer: PRIVATE HEALTH INSURANCE

## 2010-11-21 ENCOUNTER — Emergency Department (HOSPITAL_COMMUNITY)
Admission: EM | Admit: 2010-11-21 | Discharge: 2010-11-22 | Disposition: A | Discharge status: Home / Self Care | Payer: PRIVATE HEALTH INSURANCE | Attending: Emergency Medicine | Admitting: Emergency Medicine

## 2010-11-21 DIAGNOSIS — R079 Chest pain, unspecified: Secondary | ICD-10-CM | POA: Insufficient documentation

## 2010-11-21 DIAGNOSIS — E119 Type 2 diabetes mellitus without complications: Secondary | ICD-10-CM | POA: Insufficient documentation

## 2010-11-21 DIAGNOSIS — Z951 Presence of aortocoronary bypass graft: Secondary | ICD-10-CM | POA: Insufficient documentation

## 2010-11-21 DIAGNOSIS — I2510 Atherosclerotic heart disease of native coronary artery without angina pectoris: Secondary | ICD-10-CM | POA: Insufficient documentation

## 2010-11-21 DIAGNOSIS — N390 Urinary tract infection, site not specified: Secondary | ICD-10-CM | POA: Insufficient documentation

## 2010-11-21 DIAGNOSIS — I10 Essential (primary) hypertension: Secondary | ICD-10-CM | POA: Insufficient documentation

## 2010-11-21 DIAGNOSIS — R109 Unspecified abdominal pain: Secondary | ICD-10-CM | POA: Insufficient documentation

## 2010-11-21 DIAGNOSIS — E785 Hyperlipidemia, unspecified: Secondary | ICD-10-CM | POA: Insufficient documentation

## 2010-11-21 DIAGNOSIS — Z794 Long term (current) use of insulin: Secondary | ICD-10-CM | POA: Insufficient documentation

## 2010-11-21 LAB — COMPREHENSIVE METABOLIC PANEL
ALT: 27 U/L (ref 0–35)
Calcium: 10.2 mg/dL (ref 8.4–10.5)
Creatinine, Ser: 0.93 mg/dL (ref 0.50–1.10)
GFR calc Af Amer: >60 mL/min (ref >60)
GFR calc non Af Amer: >60 mL/min (ref >60)
Glucose, Bld: 102 mg/dL — ABNORMAL HIGH (ref 70–99)
Sodium: 138 mEq/L (ref 135–145)
Total Protein: 7.6 g/dL (ref 6.0–8.3)

## 2010-11-21 LAB — DIFFERENTIAL
Basophils Absolute: 0 10*3/uL (ref 0.0–0.1)
Eosinophils Absolute: 0.1 10*3/uL (ref 0.0–0.7)
Eosinophils Relative: 1 % (ref 0–5)
Monocytes Absolute: 0.5 10*3/uL (ref 0.1–1.0)

## 2010-11-21 LAB — URINE MICROSCOPIC-ADD ON

## 2010-11-21 LAB — CBC
MCHC: 32.7 g/dL (ref 30.0–36.0)
MCV: 83.7 fL (ref 78.0–100.0)
Platelets: 303 10*3/uL (ref 150–400)
RDW: 14.8 % (ref 11.5–15.5)
WBC: 8.6 10*3/uL (ref 4.0–10.5)

## 2010-11-21 LAB — PROTIME-INR: INR: 0.84 (ref 0.00–1.49)

## 2010-11-21 LAB — LIPASE, BLOOD: Lipase: 31 U/L (ref 11–59)

## 2010-11-21 LAB — URINALYSIS, ROUTINE W REFLEX MICROSCOPIC
Nitrite: NEGATIVE
Specific Gravity, Urine: 1.02 (ref 1.005–1.030)
pH: 5 (ref 5.0–8.0)

## 2010-11-21 MED ORDER — IOHEXOL 300 MG/ML  SOLN
100.0000 mL | Freq: Once | INTRAMUSCULAR | Status: AC | PRN
  Administered 2010-11-21: 100 mL via INTRAVENOUS

## 2011-01-12 LAB — POCT I-STAT, CHEM 8
Creatinine, Ser: 1
HCT: 38
Hemoglobin: 12.9
Potassium: 4.1
Sodium: 138
TCO2: 25

## 2011-01-12 LAB — URINE MICROSCOPIC-ADD ON

## 2011-01-12 LAB — URINALYSIS, ROUTINE W REFLEX MICROSCOPIC
Bilirubin Urine: NEGATIVE
Glucose, UA: >1000 — AB
Hgb urine dipstick: NEGATIVE
Specific Gravity, Urine: 1.025
Urobilinogen, UA: 0.2
pH: 5.5

## 2011-01-12 LAB — CBC
Hemoglobin: 11.4 — ABNORMAL LOW
MCHC: 32.5
RBC: 4.19
WBC: 9.1

## 2011-01-12 LAB — DIFFERENTIAL
Lymphocytes Relative: 29
Lymphs Abs: 2.6
Monocytes Absolute: 0.5
Monocytes Relative: 6
Neutro Abs: 5.8
Neutrophils Relative %: 63

## 2012-01-10 IMAGING — CR DG CHEST 2V
2 series · 2 of 2 positions shown · non-contrast
Comparison: 10/23/2010

CLINICAL DATA: Right-sided pain.  Recent cholecystectomy.

CHEST - 2 VIEW

[w chest pa]
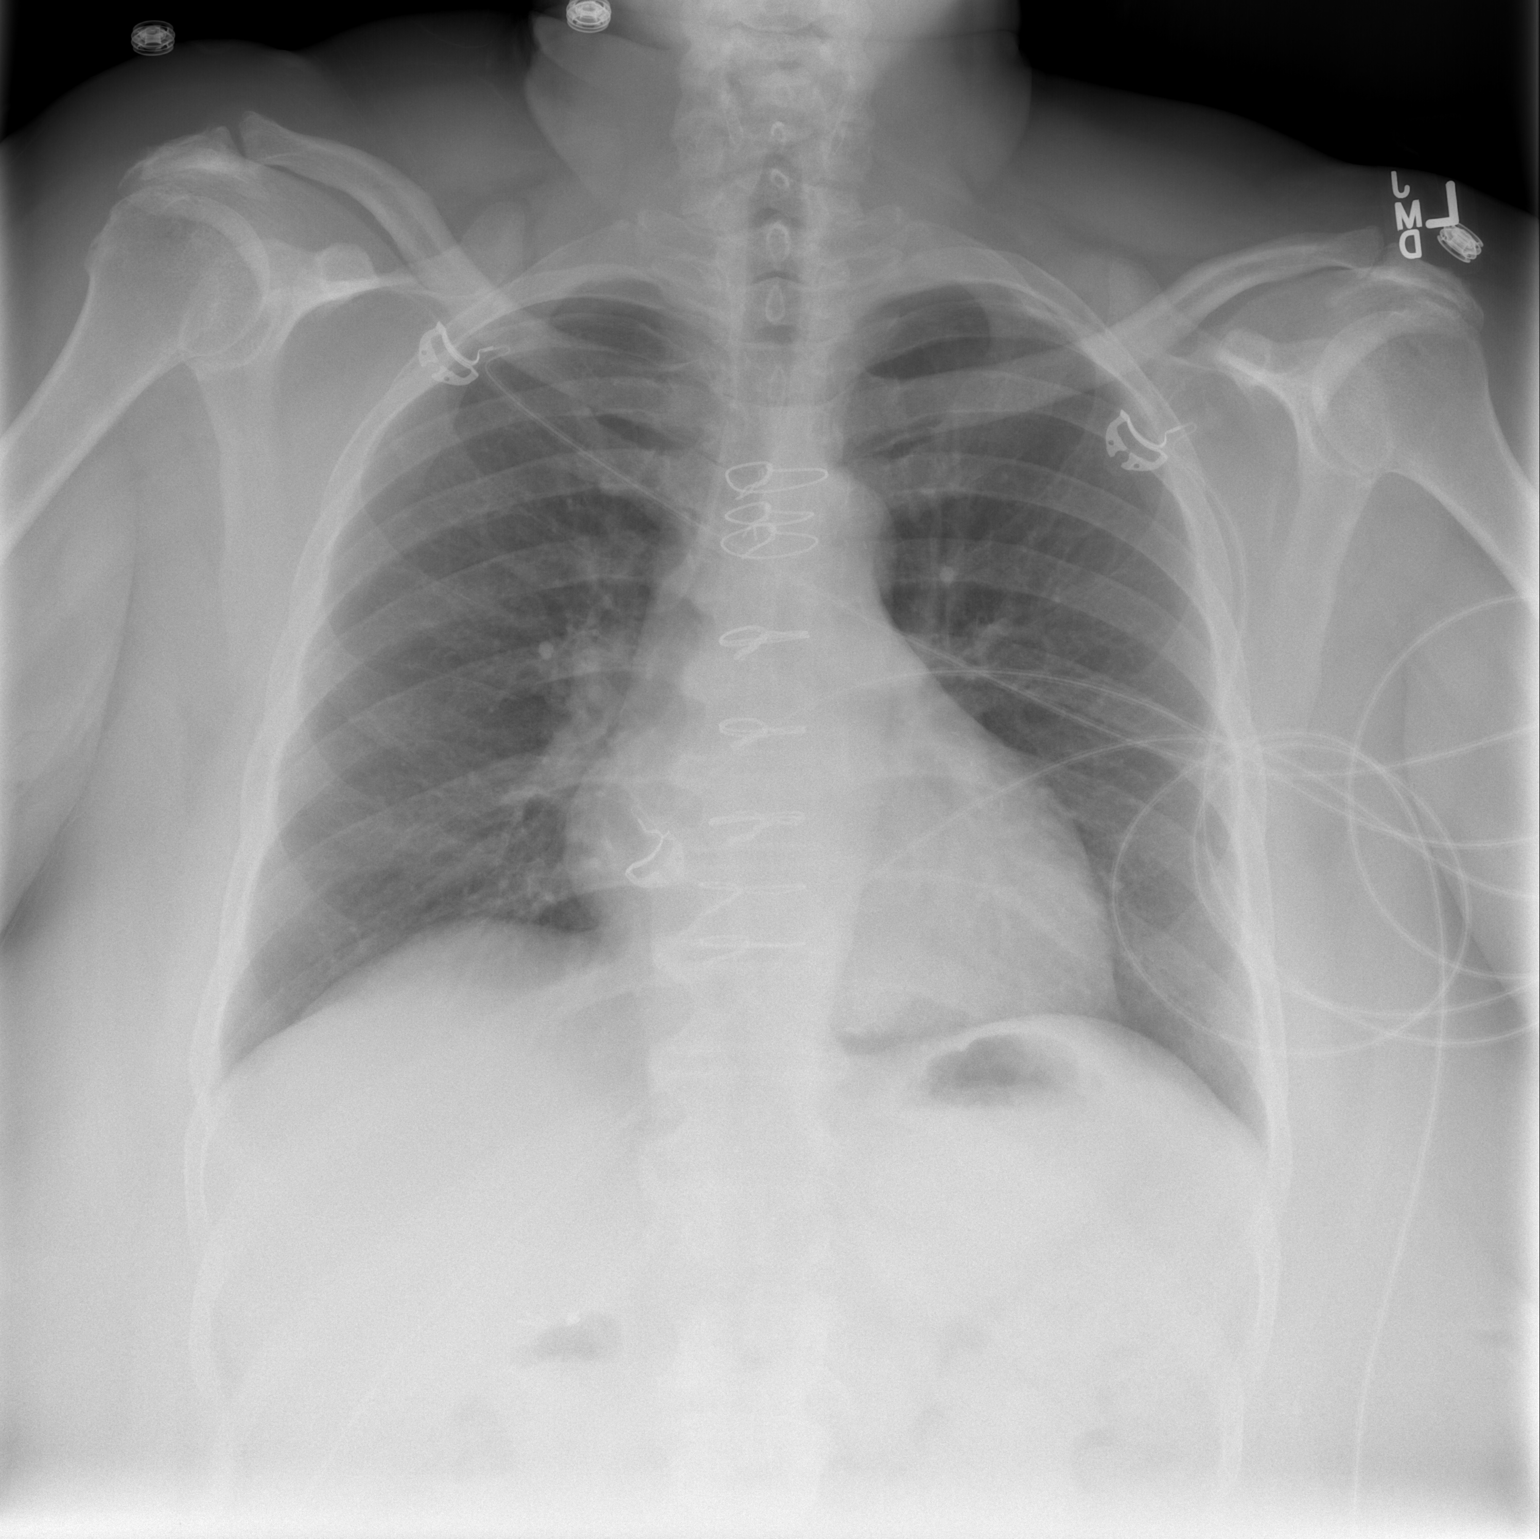

[w chest lat *]
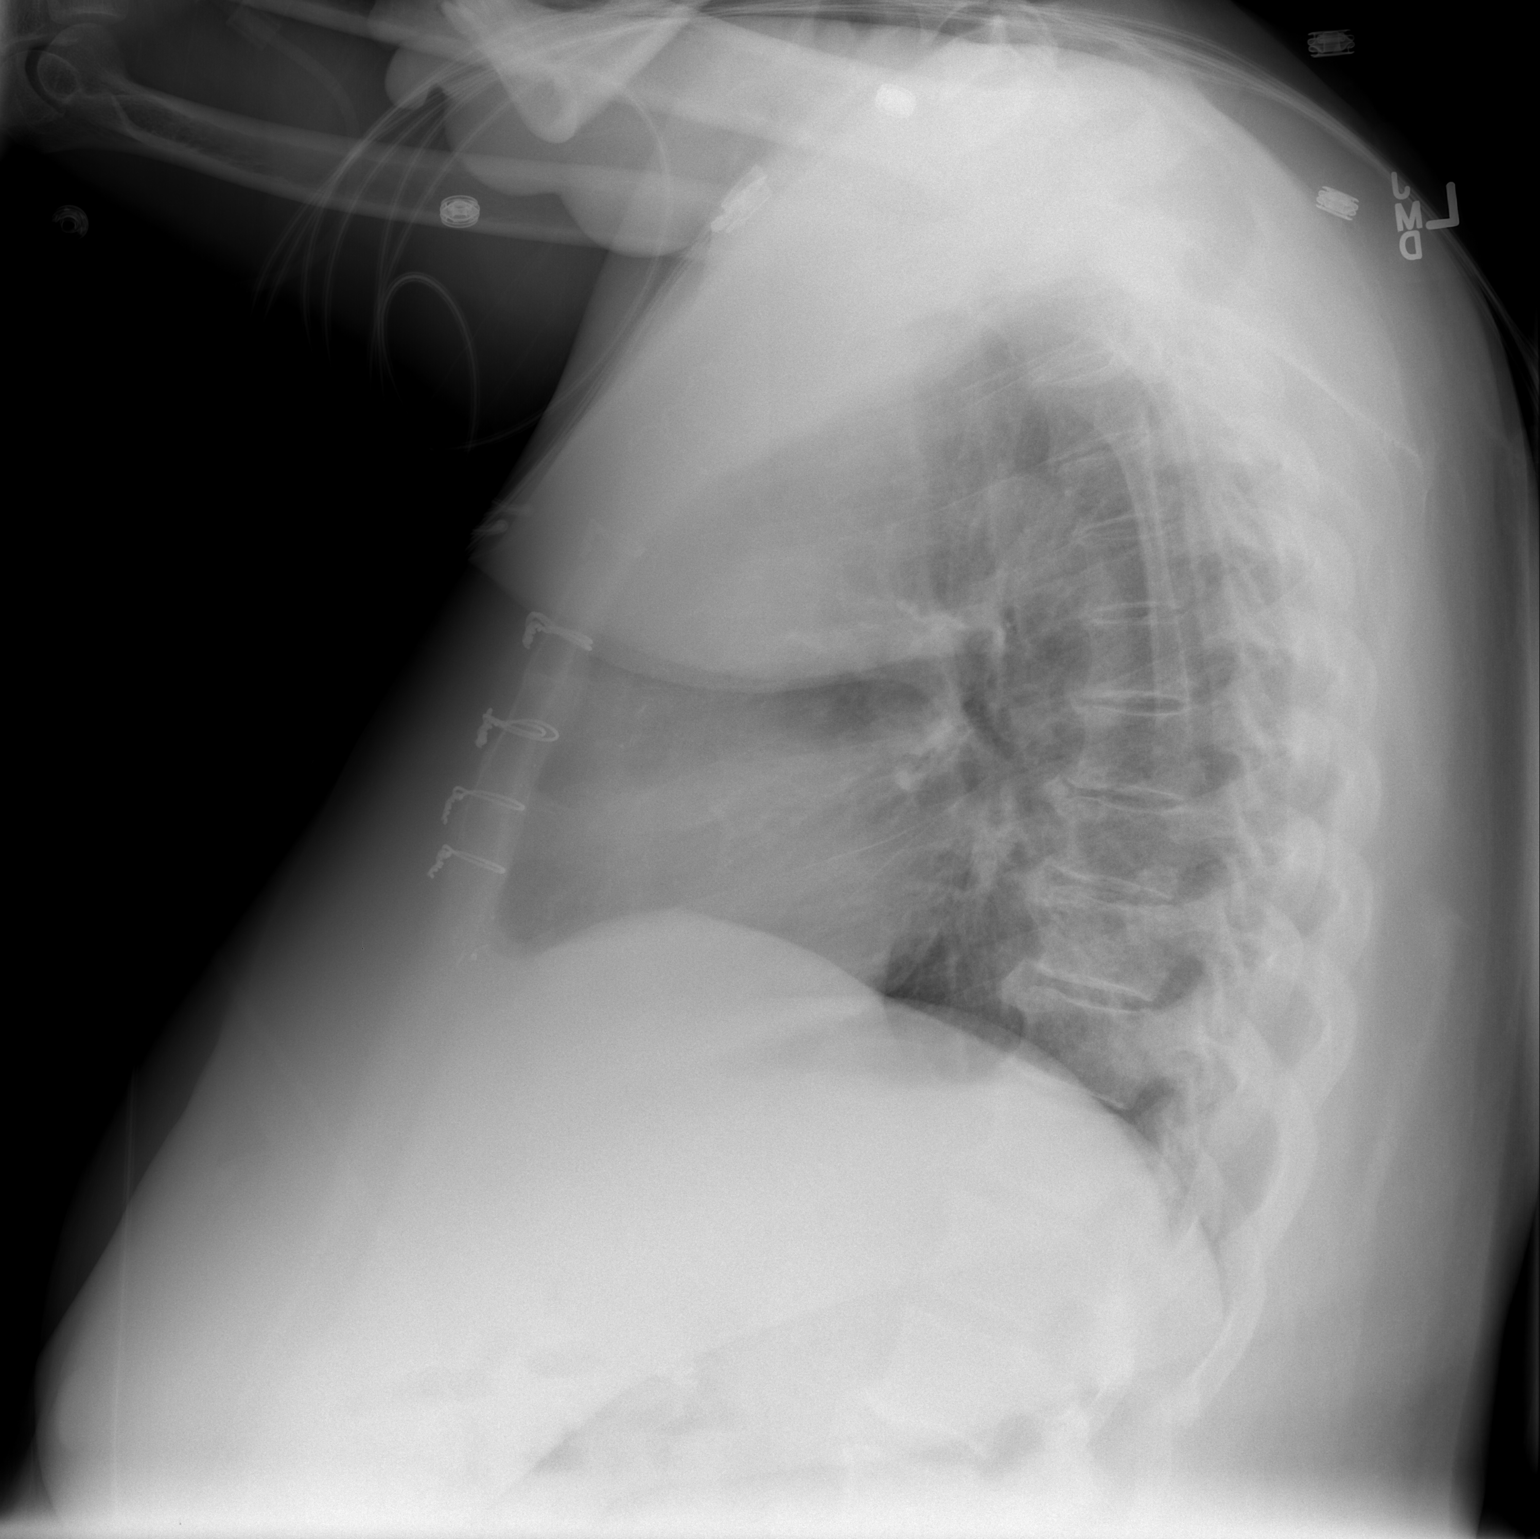

[2 of 2 positions shown; findings below may reference images not displayed]

FINDINGS: Prior median sternotomy. Heart and mediastinal contours
are within normal limits.  No focal opacities or effusions.  No
acute bony abnormality.  Degenerative changes in the thoracic
spine.
IMPRESSION: No active cardiopulmonary disease.

## 2012-01-10 IMAGING — CT CT ABD-PELV W/ CM
2 of 5 series · 17 of 46 positions shown, 19 images · IV contrast (APPLIED)
Comparison: 09/20/2010

CLINICAL DATA: Right-sided abdominal pain, status post
cholecystectomy 10/28/2010

CT ABDOMEN AND PELVIS WITH CONTRAST
TECHNIQUE: Multidetector CT imaging of the abdomen and pelvis was
performed following the standard protocol during bolus
administration of intravenous contrast.
Contrast: 100 ml [id number] IV

[Series 2: abd_pel 5.0 b40f st · axial · 0.74mm/px · z∈[-724,-314]mm · 14 of 92 slices shown, 16 images]
[im 5/92  soft-tissue]
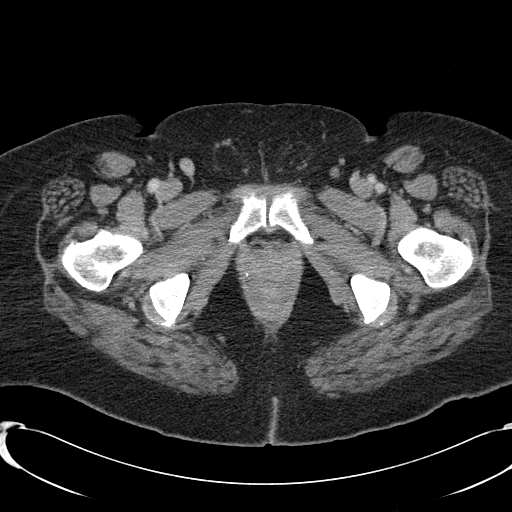
[im 5/92  bone]
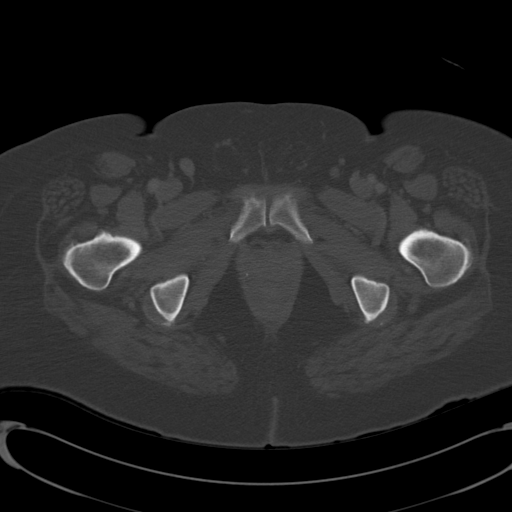
[im 10/92  soft-tissue]
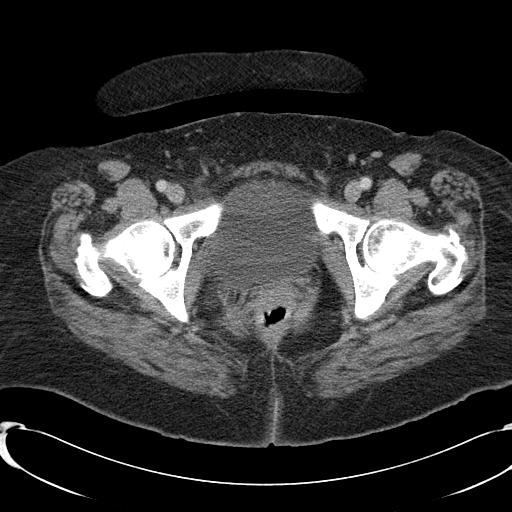
[im 20/92  soft-tissue]
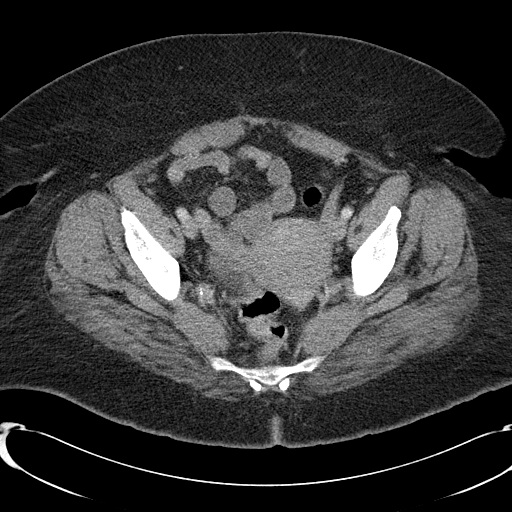
[im 24/92  soft-tissue]
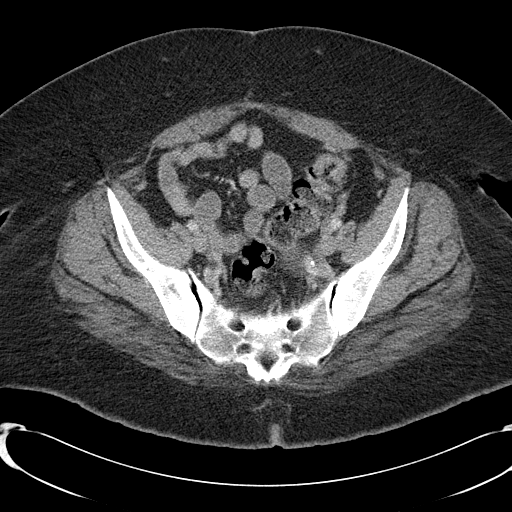
[im 29/92  soft-tissue]
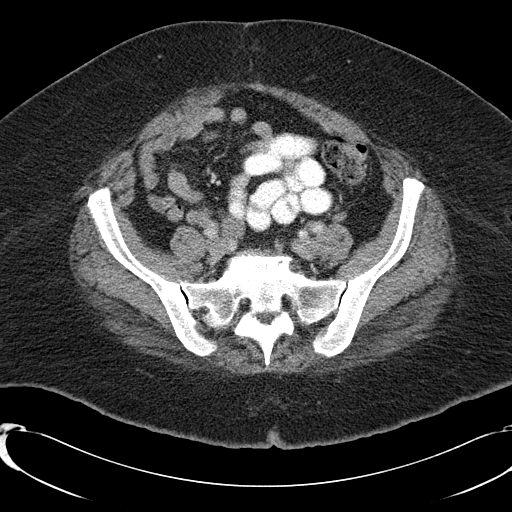
[im 39/92  soft-tissue]
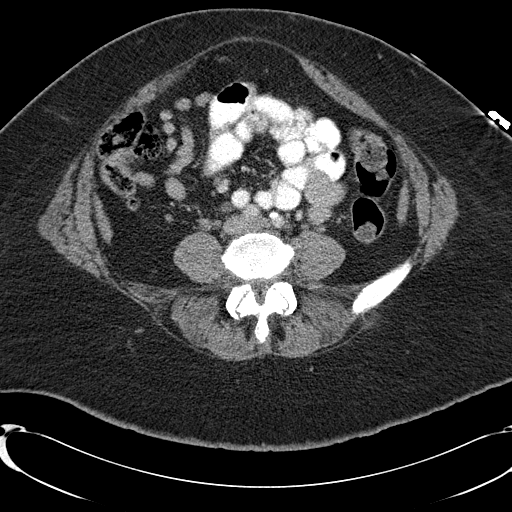
[im 44/92  soft-tissue]
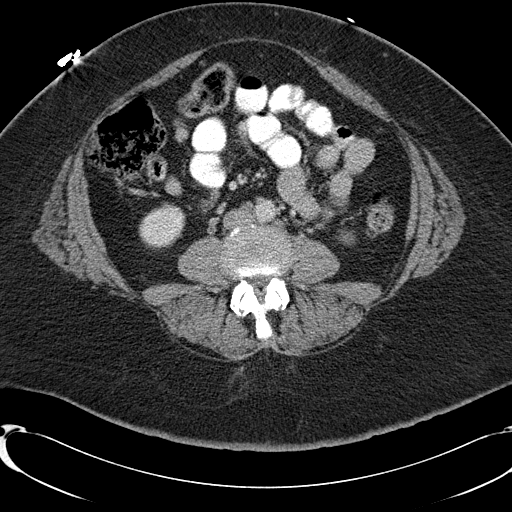
[im 48/92  soft-tissue]
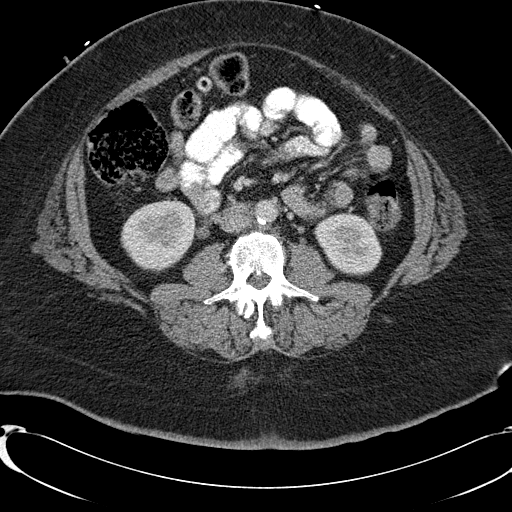
[im 53/92  soft-tissue]
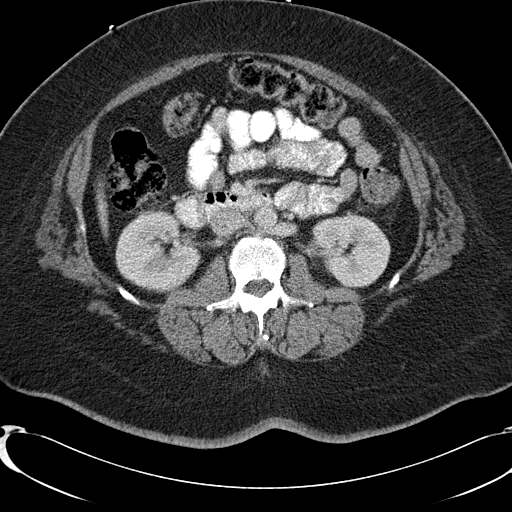
[im 53/92  bone]
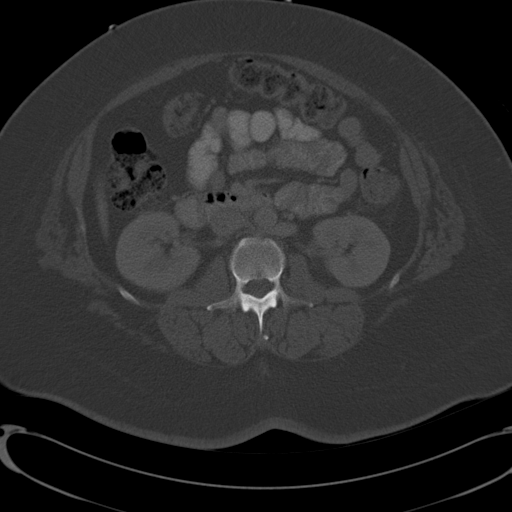
[im 63/92  soft-tissue]
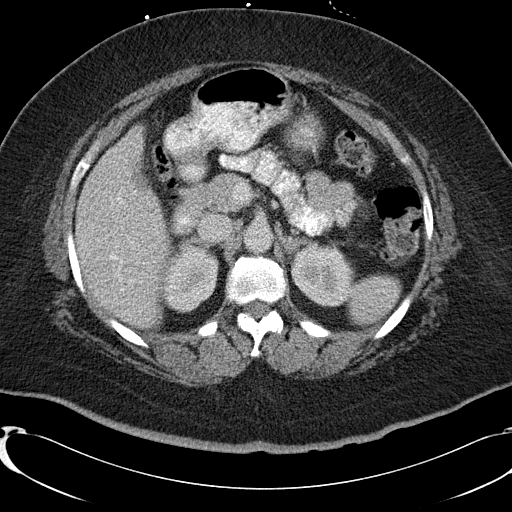
[im 68/92  soft-tissue]
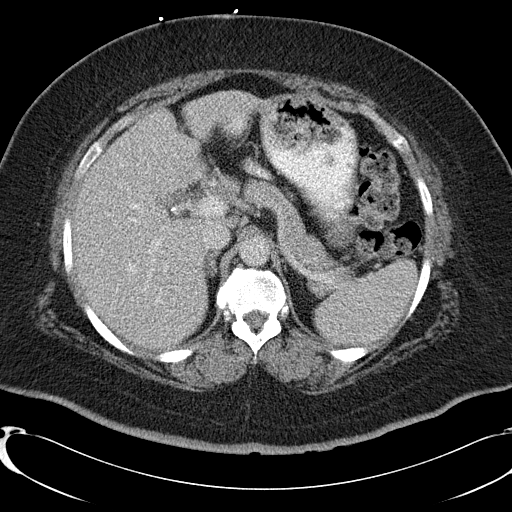
[im 72/92  soft-tissue]
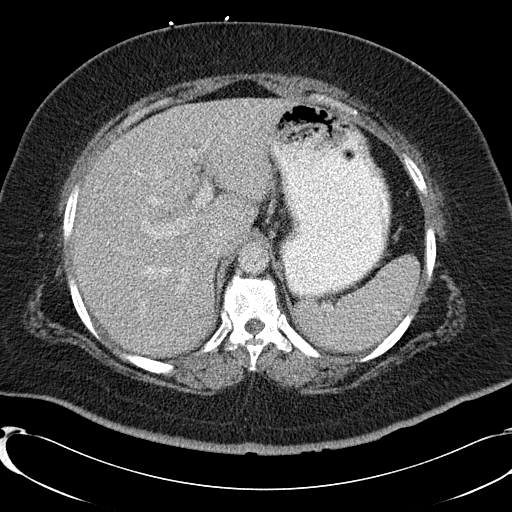
[im 82/92  soft-tissue]
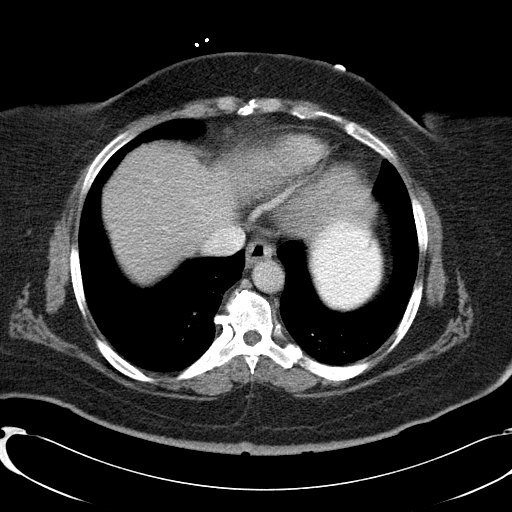
[im 87/92  soft-tissue]
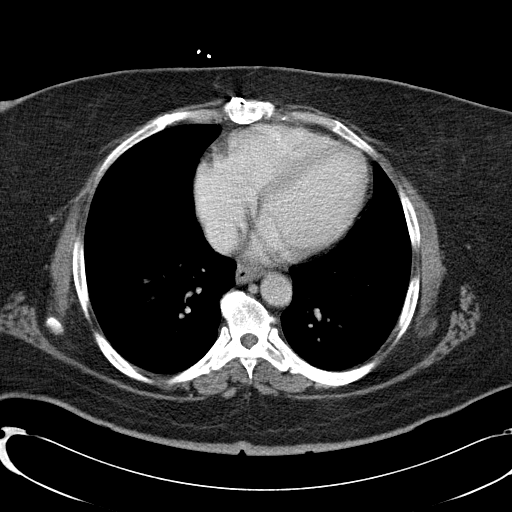

[Series 602: coronal · coronal · 0.93mm/px · 3 of 94 slices shown]
[im 32/94  soft-tissue]
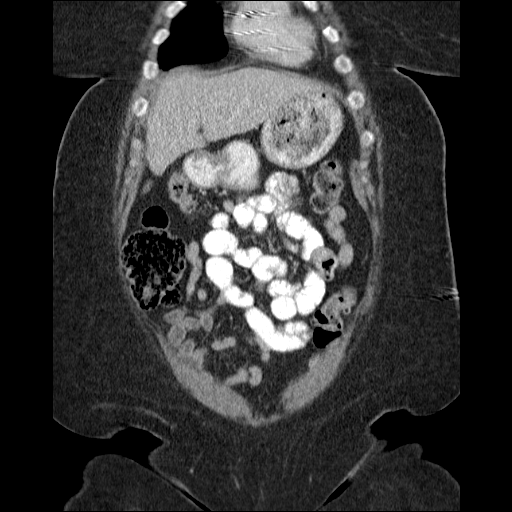
[im 42/94  soft-tissue]
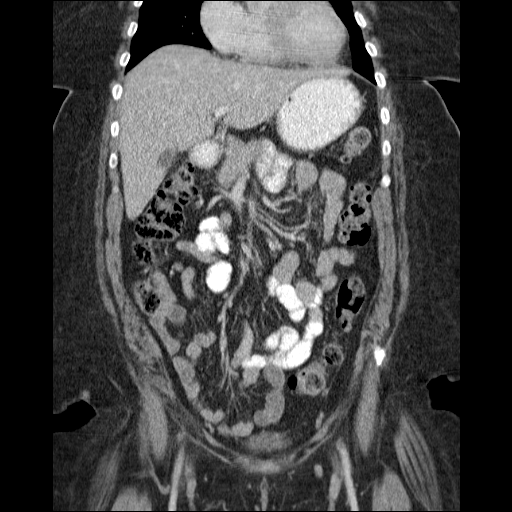
[im 52/94  soft-tissue]
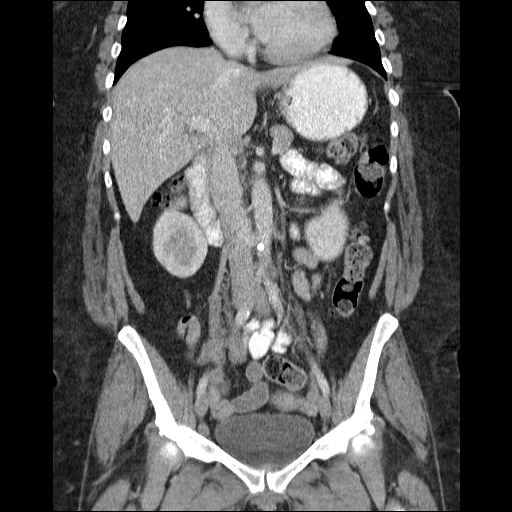

[17 of 46 positions shown; findings below may reference images not displayed]

FINDINGS: Minimal dependent patchy opacity in the bilateral lung
bases.

Liver, spleen, pancreas, and left adrenal gland are within normal
limits.  Stable right adrenal adenoma.

Status post cholecystectomy.  No intrahepatic or extrahepatic
ductal dilatation.

Kidneys are within normal limits.  No hydronephrosis.

No evidence of bowel obstruction.  Normal appendix.  Colonic
diverticulosis, without associated inflammatory changes.

No evidence of abdominal aortic aneurysm.

No abdominopelvic ascites.  No suspicious abdominopelvic
lymphadenopathy.

Uterus and bilateral ovaries are unremarkable.

Bladder is within normal limits.

Degenerative changes of the visualized thoracolumbar spine.
IMPRESSION: Normal appendix.  No evidence of bowel obstruction.

Status post cholecystectomy.  No intrahepatic or extrahepatic
ductal dilatation.

No CT findings to account for the patient's abdominal pain.

## 2012-09-19 ENCOUNTER — Emergency Department (INDEPENDENT_AMBULATORY_CARE_PROVIDER_SITE_OTHER)
Admission: EM | Admit: 2012-09-19 | Discharge: 2012-09-19 | Disposition: A | Discharge status: Other Acute Inpatient Hospital | Payer: Self-pay | Source: Home / Self Care | Attending: Emergency Medicine | Admitting: Emergency Medicine

## 2012-09-19 ENCOUNTER — Encounter (HOSPITAL_COMMUNITY): Payer: Self-pay

## 2012-09-19 ENCOUNTER — Ambulatory Visit: Payer: No Typology Code available for payment source | Attending: Family Medicine | Admitting: Family Medicine

## 2012-09-19 VITALS — BP 178/81 | HR 90 | Temp 98.6°F | Resp 18 | Ht 65.0 in | Wt 230.0 lb

## 2012-09-19 DIAGNOSIS — G609 Hereditary and idiopathic neuropathy, unspecified: Secondary | ICD-10-CM

## 2012-09-19 DIAGNOSIS — E119 Type 2 diabetes mellitus without complications: Secondary | ICD-10-CM

## 2012-09-19 DIAGNOSIS — E1165 Type 2 diabetes mellitus with hyperglycemia: Secondary | ICD-10-CM

## 2012-09-19 DIAGNOSIS — E785 Hyperlipidemia, unspecified: Secondary | ICD-10-CM

## 2012-09-19 DIAGNOSIS — G629 Polyneuropathy, unspecified: Secondary | ICD-10-CM

## 2012-09-19 DIAGNOSIS — E7800 Pure hypercholesterolemia, unspecified: Secondary | ICD-10-CM

## 2012-09-19 DIAGNOSIS — I2510 Atherosclerotic heart disease of native coronary artery without angina pectoris: Secondary | ICD-10-CM

## 2012-09-19 DIAGNOSIS — I10 Essential (primary) hypertension: Secondary | ICD-10-CM

## 2012-09-19 LAB — HEMOGLOBIN A1C: Mean Plasma Glucose: 312 mg/dL — ABNORMAL HIGH (ref <117)

## 2012-09-19 LAB — GLUCOSE, POCT (MANUAL RESULT ENTRY): POC Glucose: 369 mg/dl — AB (ref 70–99)

## 2012-09-19 LAB — POCT I-STAT, CHEM 8
Calcium, Ion: 1.22 mmol/L (ref 1.12–1.23)
Chloride: 104 mEq/L (ref 96–112)
Glucose, Bld: 359 mg/dL — ABNORMAL HIGH (ref 70–99)
HCT: 42 % (ref 36.0–46.0)
Hemoglobin: 14.3 g/dL (ref 12.0–15.0)
Potassium: 4.4 mEq/L (ref 3.5–5.1)

## 2012-09-19 MED ORDER — HYDROCHLOROTHIAZIDE 12.5 MG PO CAPS: 12.5000 mg | ORAL_CAPSULE | Freq: Every day | ORAL | Status: DC

## 2012-09-19 MED ORDER — METFORMIN HCL ER 500 MG PO TB24: 1000.0000 mg | ORAL_TABLET | Freq: Two times a day (BID) | ORAL | Status: DC

## 2012-09-19 MED ORDER — METOPROLOL TARTRATE 50 MG PO TABS: 50.0000 mg | ORAL_TABLET | Freq: Two times a day (BID) | ORAL | Status: DC

## 2012-09-19 MED ORDER — PRAVASTATIN SODIUM 40 MG PO TABS: 40.0000 mg | ORAL_TABLET | Freq: Every day | ORAL | Status: DC

## 2012-09-19 MED ORDER — "INSULIN SYRINGE 28G X 1/2"" 0.5 ML MISC": 1.0000 | Status: DC | PRN

## 2012-09-19 MED ORDER — INSULIN ASPART 100 UNIT/ML ~~LOC~~ SOLN
10.0000 [IU] | Freq: Once | SUBCUTANEOUS | Status: AC
  Administered 2012-09-19: 10 [IU] via SUBCUTANEOUS

## 2012-09-19 MED ORDER — INSULIN ASPART PROT & ASPART (70-30 MIX) 100 UNIT/ML PEN: 20.0000 [IU] | PEN_INJECTOR | Freq: Two times a day (BID) | SUBCUTANEOUS | Status: DC

## 2012-09-19 NOTE — ED Notes (Signed)
Out of her  medications since November, cannot afford them since her open heart surgery in 2005. C/o she has been having swelling and numbness in both her legs

## 2012-09-19 NOTE — ED Notes (Signed)
Pt will leave here to go directly to adult care clinic

## 2012-09-19 NOTE — Progress Notes (Signed)
[facility] and Dr. Lorenz Coaster for uncontrolled diabetes. Patient states blood glucose at urgentcare was over 300.  Patient states she is unable to afford insulin.

## 2012-09-19 NOTE — ED Provider Notes (Signed)
Chief Complaint:   Chief Complaint  Patient presents with  . Medication Refill    History of Present Illness:   Deborah Coffey is a 57 year old female with type 2 diabetes, hypertension, coronary artery disease, and hyperlipidemia who presents today for refills on medications and leg and foot pain and numbness. She was under the care of Dr. Renae Gloss, but lost her insurance, so she has not been on any medication since last November. Her biggest problem is numbness of her feet extending up onto the legs, some swelling of the feet, and pain that begins in the feet and radiates up in the buttocks. This is been going on for months. She also has felt exhausted, tired, and has had no energy. She denies any headaches, dizziness, blurry vision, shortness of breath, chest pain, tightness, or pressure. She has had diabetes for about 20 years. She usually is on Saxagliptin-metformin 2.08/998 once a day, aspirin 81 mg per day, hydrochlorothiazide 25 mg per day, NovoLog 70/3022 units twice daily, and simvastatin 40 mg a day, but has been out of all of these except for the aspirin since last November. She denies polyuria, polydipsia, or blurry vision.  Review of Systems:  Other than noted above, the patient denies any of the following symptoms. Systemic:  No fever, chills, fatigue, weight loss or gain. Eye:  No blurred vision or diplopia. Lungs:  No cough, wheezing, or shortness of breath. Heart:  No chest pain, tightness, pressure, palpitation, dizziness, syncope, or edema. Abdomen:  No abdominal pain, nausea, vomiting or diarrrhea. GU:  No dysuria, frequency, urgency, hematuria. Ext:  No pain, paresthesias, swelling, or ulcerations. Endocrine:  No polyuria, polydipsia, heat or cold intolerance. Skin:  No rash or itching. Neuro:  No focal weakness or numbness.   PMFSH:  Past medical history, family history, social history, meds, and allergies were reviewed. She had a coronary artery bypass graft in 2005.  She still smokes a few cigarettes daily.  Physical Exam:   Vital signs:  BP 184/96  Pulse 101  Temp(Src) 99.3 F (37.4 C) (Oral)  Resp 16  SpO2 94% Gen:  Alert, oriented, in no distress. Eye:  PERRL, full EOM, lids conjunctivas, and sclera unremarkable. ENT:  TMs and canals normal.  Mucous membranes moist.  No acetone odor.  Pharynx clear.   Neck:  Supple, full ROM, no adenopathy or tenderness.  No JVD. Lungs:  Clear to auscultation.  No wheezes, rales or rhonchi. Heart:  Regular rhythm.  No gallops or murmers. Abdomen:  Soft, flat, non-distended, nontener.  No hepato-splenomegaly or mass.  Bowel sounds normal.  No pulsatile midline mass or bruit. Ext:  No edema, pulses full.  No ulceration or skin lesions. Skin:  Clear, warm and dry.  No rash or lesions. Neuro:  Alert and oriented times 3.  No focal weakness.  Speech normal.  CNs intact.  Labs:   Results for orders placed during the hospital encounter of 09/19/12  POCT I-STAT, CHEM 8      Result Value Range   Sodium 138  135 - 145 mEq/L   Potassium 4.4  3.5 - 5.1 mEq/L   Chloride 104  96 - 112 mEq/L   BUN 14  6 - 23 mg/dL   Creatinine, Ser 1.61  0.50 - 1.10 mg/dL   Glucose, Bld 096 (*) 70 - 99 mg/dL   Calcium, Ion 0.45  4.09 - 1.23 mmol/L   TCO2 28  0 - 100 mmol/L   Hemoglobin 14.3  12.0 - 15.0  g/dL   HCT 09.8  11.9 - 14.7 %    Assessment:  The primary encounter diagnosis was Type 2 diabetes mellitus. Diagnoses of Hypertension, Coronary artery disease, Hypercholesterolemia, and Peripheral neuropathy were also pertinent to this visit.  Her hypertension and diabetes are not well controlled due to being out of her medication for months. Her uncontrolled diabetes is probably the cause of her peripheral neuropathy. We called over to the [facility] and they were gracious enough to be able to take her this afternoon, so she was sent right over there for a get acquainted visit and to get restarted on whatever  medications they would deem necessary.  Plan:   1.  The following meds were prescribed:   New Prescriptions   No medications on file   2.  The patient was sent right to the [facility].    Reuben Likes, MD 09/19/12 [phone]

## 2012-09-19 NOTE — Progress Notes (Signed)
Patient ID: Deborah Coffey, female   DOB: [birth year], 56 y.o.   MRN: [id number]  CC: diabetes   HPI: PT reports that she has had diabetes for a long time but lost her medical coverage and reports that she has not taken her insulin or any of her medications since November 2013.  Pt says that she has not been able to get her prescriptions filled.  Pt says that she was seen at urgent care today.  She was tested with her blood glucose and it was over 300.    The patient says that she is willing to start taking her medications again but does need some assistance and new prescriptions.  Pt says that she is having numbness in the feet and legs.     Allergies  Allergen Reactions  . Ciprofloxacin    Past Medical History  Diagnosis Date  . Diabetes mellitus   . Allergy   . Arthritis    Current Outpatient Prescriptions on File Prior to Visit  Medication Sig Dispense Refill  . aspirin 81 MG tablet Take 81 mg by mouth daily.        . insulin aspart protamine-insulin aspart (NOVOLOG 70/30) (70-30) 100 UNIT/ML injection Inject into the skin.         No current facility-administered medications on file prior to visit.   Family History  Problem Relation Age of Onset  . Heart disease Mother    History   Social History  . Marital Status: Single    Spouse Name: N/A    Number of Children: N/A  . Years of Education: N/A   Occupational History  . Not on file.   Social History Main Topics  . Smoking status: Current Every Day Smoker -- 1.00 packs/day  . Smokeless tobacco: Not on file  . Alcohol Use: No  . Drug Use: No  . Sexually Active: Not on file   Other Topics Concern  . Not on file   Social History Narrative  . No narrative on file    Review of Systems  Constitutional: Negative for fever, chills, diaphoresis, activity change, appetite change and fatigue.  HENT: Negative for ear pain, nosebleeds, congestion, facial swelling, rhinorrhea, neck pain, neck stiffness and ear  discharge.   Eyes: Negative for pain, discharge, redness, itching and visual disturbance.  Respiratory: Negative for cough, choking, chest tightness, shortness of breath, wheezing and stridor.   Cardiovascular: Negative for chest pain, palpitations and leg swelling.  Gastrointestinal: Negative for abdominal distention.  Genitourinary: Negative for dysuria, urgency, frequency, hematuria, flank pain, decreased urine volume, difficulty urinating and dyspareunia.  Musculoskeletal: Negative for back pain, joint swelling, arthralgias and gait problem.  Neurological: Negative for dizziness, tremors, seizures, syncope, facial asymmetry, speech difficulty, weakness, light-headedness, numbness and headaches.  Hematological: Negative for adenopathy. Does not bruise/bleed easily.  Psychiatric/Behavioral: Negative for hallucinations, behavioral problems, confusion, dysphoric mood, decreased concentration and agitation.    Objective:   Filed Vitals:   09/19/12 1651  BP: 178/81  Pulse: 90  Temp: 98.6 F (37 C)  Resp: 18    Physical Exam  Constitutional: Appears well-developed and well-nourished. No distress.  HENT: Normocephalic. External right and left ear normal. Oropharynx is clear and moist.  Eyes: Conjunctivae and EOM are normal. PERRLA, no scleral icterus.  Neck: Normal ROM. Neck supple. No JVD. No tracheal deviation. No thyromegaly.  CVS: RRR, S1/S2 +, no murmurs, no gallops, no carotid bruit.  Pulmonary: Effort and breath sounds normal, no stridor, rhonchi, wheezes, rales.  Abdominal:  Soft. BS +,  no distension, tenderness, rebound or guarding.  Musculoskeletal: Normal range of motion. No edema and no tenderness.  Lymphadenopathy: No lymphadenopathy noted, cervical, inguinal. Neuro: Alert. Normal reflexes, muscle tone coordination. No cranial nerve deficit. Skin: Skin is warm and dry. No rash noted. Not diaphoretic. No erythema. No pallor.  Psychiatric: Normal mood and affect. Behavior,  judgment, thought content normal.   Lab Results  Component Value Date   WBC 8.6 11/21/2010   HGB 14.3 09/19/2012   HCT 42.0 09/19/2012   MCV 83.7 11/21/2010   PLT 303 11/21/2010   Lab Results  Component Value Date   CREATININE 0.80 09/19/2012   BUN 14 09/19/2012   NA 138 09/19/2012   K 4.4 09/19/2012   CL 104 09/19/2012   CO2 28 11/21/2010    Lab Results  Component Value Date   HGBA1C  Value: 11.7 (NOTE) The ADA recommends the following therapeutic goal for glycemic control related to Hgb A1c measurement: Goal of therapy: <6.5 Hgb A1c  Reference: American Diabetes Association: Clinical Practice Recommendations 2010, Diabetes Care, 2010, 33: (Suppl  1).* 11/12/2008   Lipid Panel     Component Value Date/Time   CHOL  Value: 153        ATP III CLASSIFICATION:  <200     mg/dL   Desirable  161-096  mg/dL   Borderline High  >=045    mg/dL   High        07/26/8117 0220   TRIG 167* 11/13/2008 0220   HDL 60 11/13/2008 0220   CHOLHDL 2.6 11/13/2008 0220   VLDL 33 11/13/2008 0220   LDLCALC  Value: 60        Total Cholesterol/HDL:CHD Risk Coronary Heart Disease Risk Table                     Men   Women  1/2 Average Risk   3.4   3.3  Average Risk       5.0   4.4  2 X Average Risk   9.6   7.1  3 X Average Risk  23.4   11.0        Use the calculated Patient Ratio above and the CHD Risk Table to determine the patient's CHD Risk.        ATP III CLASSIFICATION (LDL):  <100     mg/dL   Optimal  147-829  mg/dL   Near or Above                    Optimal  130-159  mg/dL   Borderline  562-130  mg/dL   High  >865     mg/dL   Very High 7/84/6962 9528       Assessment and plan:   Patient Active Problem List   Diagnosis Date Noted  . Accelerated hypertension 09/19/2012  . Uncontrolled diabetes mellitus 09/19/2012  . Dyslipidemia 09/19/2012  Pt is at high risk for acute and chronic complications of poorly controlled diabetes mellitus, pt was given this information and verbalized understanding Refilled meds.  Novolog 10 units  given in office Samples given for novolog 70/30 take 20 units bid AC Metformin ER 1000 mg po bid Lopressor 50 mg po bid HCT 12.5 mg po daily Labs ordered today  Follow up in 1 week   The patient was given clear instructions to go to ER or return to medical center if symptoms don't improve, worsen or new problems develop.  The  patient verbalized understanding.  The patient was told to call to get lab results if they haven't heard anything in the next week.    Rodney Langton, MD, CDE, FAAFP [facility] [place], [place]

## 2012-09-19 NOTE — Patient Instructions (Signed)
DASH Diet The DASH diet stands for "Dietary Approaches to Stop Hypertension." It is a healthy eating plan that has been shown to reduce high blood pressure (hypertension) in as little as 14 days, while also possibly providing other significant health benefits. These other health benefits include reducing the risk of breast cancer after menopause and reducing the risk of type 2 diabetes, heart disease, colon cancer, and stroke. Health benefits also include weight loss and slowing kidney failure in patients with chronic kidney disease.  DIET GUIDELINES  Limit salt (sodium). Your diet should contain less than 1500 mg of sodium daily.  Limit refined or processed carbohydrates. Your diet should include mostly whole grains. Desserts and added sugars should be used sparingly.  Include small amounts of heart-healthy fats. These types of fats include nuts, oils, and tub margarine. Limit saturated and trans fats. These fats have been shown to be harmful in the body. CHOOSING FOODS  The following food groups are based on a 2000 calorie diet. See your Registered Dietitian for individual calorie needs. Grains and Grain Products (6 to 8 servings daily)  Eat More Often: Whole-wheat bread, brown rice, whole-grain or wheat pasta, quinoa, popcorn without added fat or salt (air popped).  Eat Less Often: White bread, white pasta, white rice, cornbread. Vegetables (4 to 5 servings daily)  Eat More Often: Fresh, frozen, and canned vegetables. Vegetables may be raw, steamed, roasted, or grilled with a minimal amount of fat.  Eat Less Often/Avoid: Creamed or fried vegetables. Vegetables in a cheese sauce. Fruit (4 to 5 servings daily)  Eat More Often: All fresh, canned (in natural juice), or frozen fruits. Dried fruits without added sugar. One hundred percent fruit juice ( cup [237 mL] daily).  Eat Less Often: Dried fruits with added sugar. Canned fruit in light or heavy syrup. [employer], Fish, and Poultry (2  servings or less daily. One serving is 3 to 4 oz [85-114 g]).  Eat More Often: Ninety percent or leaner ground beef, tenderloin, sirloin. Round cuts of beef, chicken breast, [place] breast. All fish. Grill, bake, or broil your meat. Nothing should be fried.  Eat Less Often/Avoid: Fatty cuts of meat, [place], or chicken leg, thigh, or wing. Fried cuts of meat or fish. Dairy (2 to 3 servings)  Eat More Often: Low-fat or fat-free milk, low-fat plain or light yogurt, reduced-fat or part-skim cheese.  Eat Less Often/Avoid: Milk (whole, 2%).Whole milk yogurt. Full-fat cheeses. Nuts, Seeds, and Legumes (4 to 5 servings per week)  Eat More Often: All without added salt.  Eat Less Often/Avoid: Salted nuts and seeds, canned beans with added salt. Fats and Sweets (limited)  Eat More Often: Vegetable oils, tub margarines without trans fats, sugar-free gelatin. Mayonnaise and salad dressings.  Eat Less Often/Avoid: Coconut oils, palm oils, butter, stick margarine, cream, half and half, cookies, candy, pie. FOR MORE INFORMATION The Dash Diet Eating Plan: www.dashdiet.org Document Released: 03/25/2011 Document Revised: 06/28/2011 Document Reviewed: 03/25/2011 [facility] Patient Information 2014 [place], [place]. Hypertension As your heart beats, it forces blood through your arteries. This force is your blood pressure. If the pressure is too high, it is called hypertension (HTN) or high blood pressure. HTN is dangerous because you may have it and not know it. High blood pressure may mean that your heart has to work harder to pump blood. Your arteries may be narrow or stiff. The extra work puts you at risk for heart disease, stroke, and other problems.  Blood pressure consists of two numbers, a  higher number over a lower, 110/72, for example. It is stated as "110 over 72." The ideal is below 120 for the top number (systolic) and under 80 for the bottom (diastolic). Write down your blood pressure today. You  should pay close attention to your blood pressure if you have certain conditions such as:  Heart failure.  Prior heart attack.  Diabetes  Chronic kidney disease.  Prior stroke.  Multiple risk factors for heart disease. To see if you have HTN, your blood pressure should be measured while you are seated with your arm held at the level of the heart. It should be measured at least twice. A one-time elevated blood pressure reading (especially in the Emergency Department) does not mean that you need treatment. There may be conditions in which the blood pressure is different between your right and left arms. It is important to see your caregiver soon for a recheck. Most people have essential hypertension which means that there is not a specific cause. This type of high blood pressure may be lowered by changing lifestyle factors such as:  Stress.  Smoking.  Lack of exercise.  Excessive weight.  Drug/tobacco/alcohol use.  Eating less salt. Most people do not have symptoms from high blood pressure until it has caused damage to the body. Effective treatment can often prevent, delay or reduce that damage. TREATMENT  When a cause has been identified, treatment for high blood pressure is directed at the cause. There are a large number of medications to treat HTN. These fall into several categories, and your caregiver will help you select the medicines that are best for you. Medications may have side effects. You should review side effects with your caregiver. If your blood pressure stays high after you have made lifestyle changes or started on medicines,   Your medication(s) may need to be changed.  Other problems may need to be addressed.  Be certain you understand your prescriptions, and know how and when to take your medicine.  Be sure to follow up with your caregiver within the time frame advised (usually within two weeks) to have your blood pressure rechecked and to review your  medications.  If you are taking more than one medicine to lower your blood pressure, make sure you know how and at what times they should be taken. Taking two medicines at the same time can result in blood pressure that is too low. SEEK IMMEDIATE MEDICAL CARE IF:  You develop a severe headache, blurred or changing vision, or confusion.  You have unusual weakness or numbness, or a faint feeling.  You have severe chest or abdominal pain, vomiting, or breathing problems. MAKE SURE YOU:   Understand these instructions.  Will watch your condition.  Will get help right away if you are not doing well or get worse. Document Released: 04/05/2005 Document Revised: 06/28/2011 Document Reviewed: 11/24/2007 [facility] Patient Information 2014 [place], [place].  Blood Sugar Monitoring, Adult GLUCOSE METERS FOR SELF-MONITORING OF BLOOD GLUCOSE  It is important to be able to correctly measure your blood sugar (glucose). You can use a blood glucose monitor (a small battery-operated device) to check your glucose level at any time. This allows you and your caregiver to monitor your diabetes and to determine how well your treatment plan is working. The process of monitoring your blood glucose with a glucose meter is called self-monitoring of blood glucose (SMBG). When people with diabetes control their blood sugar, they have better health. To test for glucose with a typical glucose  meter, place the disposable strip in the meter. Then place a small sample of blood on the "test strip." The test strip is coated with chemicals that combine with glucose in blood. The meter measures how much glucose is present. The meter displays the glucose level as a number. Several new models can record and store a number of test results. Some models can connect to personal computers to store test results or print them out.  Newer meters are often easier to use than older models. Some meters allow you to get blood from places other  than your fingertip. Some new models have automatic timing, error codes, signals, or barcode readers to help with proper adjustment (calibration). Some meters have a large display screen or spoken instructions for people with visual impairments.  INSTRUCTIONS FOR USING GLUCOSE METERS  Wash your hands with soap and warm water, or clean the area with alcohol. Dry your hands completely.  Prick the side of your fingertip with a lancet (a sharp-pointed tool used by hand).  Hold the hand down and gently milk the finger until a small drop of blood appears. Catch the blood with the test strip.  Follow the instructions for inserting the test strip and using the SMBG meter. Most meters require the meter to be turned on and the test strip to be inserted before applying the blood sample.  Record the test result.  Read the instructions carefully for both the meter and the test strips that go with it. Meter instructions are found in the user manual. Keep this manual to help you solve any problems that may arise. Many meters use "error codes" when there is a problem with the meter, the test strip, or the blood sample on the strip. You will need the manual to understand these error codes and fix the problem.  New devices are available such as laser lancets and meters that can test blood taken from "alternative sites" of the body, other than fingertips. However, you should use standard fingertip testing if your glucose changes rapidly. Also, use standard testing if:  You have eaten, exercised, or taken insulin in the past 2 hours.  You think your glucose is low.  You tend to not feel symptoms of low blood glucose (hypoglycemia).  You are ill or under stress.  Clean the meter as directed by the manufacturer.  Test the meter for accuracy as directed by the manufacturer.  Take your meter with you to your caregiver's office. This way, you can test your glucose in front of your caregiver to make sure you are  using the meter correctly. Your caregiver can also take a sample of blood to test using a routine lab method. If values on the glucose meter are close to the lab results, you and your caregiver will see that your meter is working well and you are using good technique. Your caregiver will advise you about what to do if the results do not match. FREQUENCY OF TESTING  Your caregiver will tell you how often you should check your blood glucose. This will depend on your type of diabetes, your current level of diabetes control, and your types of medicines. The following are general guidelines, but your care plan may be different. Record all your readings and the time of day you took them for review with your caregiver.   Diabetes type 1.  When you are using insulin with good diabetic control (either multiple daily injections or via a pump), you should check your glucose  4 times a day.  If your diabetes is not well controlled, you may need to monitor more frequently, including before meals and 2 hours after meals, at bedtime, and occasionally between 2 a.m. and 3 a.m.  You should always check your glucose before a dose of insulin or before changing the rate on your insulin pump.  Diabetes type 2.  Guidelines for SMBG in diabetes type 2 are not as well defined.  If you are on insulin, follow the guidelines above.  If you are on medicines, but not insulin, and your glucose is not well controlled, you should test at least twice daily.  If you are not on insulin, and your diabetes is controlled with medicines or diet alone, you should test at least once daily, usually before breakfast.  A weekly profile will help your caregiver advise you on your care plan. The week before your visit, check your glucose before a meal and 2 hours after a meal at least daily. You may want to test before and after a different meal each day so you and your caregiver can tell how well controlled your blood sugars are throughout  the course of a 24 hour period.  Gestational diabetes (diabetes during pregnancy).  Frequent testing is often necessary. Accurate timing is important.  If you are not on insulin, check your glucose 4 times a day. Check it before breakfast and 1 hour after the start of each meal.  If you are on insulin, check your glucose 6 times a day. Check it before each meal and 1 hour after the first bite of each meal.  General guidelines.  More frequent testing is required at the start of insulin treatment. Your caregiver will instruct you.  Test your glucose any time you suspect you have low blood sugar (hypoglycemia).  You should test more often when you change medicines, when you have unusual stress or illness, or in other unusual circumstances. OTHER THINGS TO KNOW ABOUT GLUCOSE METERS  Measurement Range. Most glucose meters are able to read glucose levels over a broad range of values from as low as 0 to as high as 600 mg/dL. If you get an extremely high or low reading from your meter, you should first confirm it with another reading. Report very high or very low readings to your caregiver.  Whole Blood Glucose versus Plasma Glucose. Some older home glucose meters measure glucose in your whole blood. In a lab or when using some newer home glucose meters, the glucose is measured in your plasma (one component of blood). The difference can be important. It is important for you and your caregiver to know whether your meter gives its results as "whole blood equivalent" or "plasma equivalent."  Display of High and Low Glucose Values. Part of learning how to operate a meter is understanding what the meter results mean. Know how high and low glucose concentrations are displayed on your meter.  Factors that Affect Glucose Meter Performance. The accuracy of your test results depends on many factors and varies depending on the brand and type of meter. These factors include:  Low red blood cell count  (anemia).  Substances in your blood (such as uric acid, vitamin C, and others).  Environmental factors (temperature, humidity, altitude).  Name-brand versus generic test strips.  Calibration. Make sure your meter is set up properly. It is a good idea to do a calibration test with a control solution recommended by the manufacturer of your meter whenever you begin using a  fresh bottle of test strips. This will help verify the accuracy of your meter.  Improperly stored, expired, or defective test strips. Keep your strips in a dry place with the lid on.  Soiled meter.  Inadequate blood sample. NEW TECHNOLOGIES FOR GLUCOSE TESTING Alternative site testing Some glucose meters allow testing blood from alternative sites. These include the:  Upper arm.  Forearm.  Base of the thumb.  Thigh. Sampling blood from alternative sites may be desirable. However, it may have some limitations. Blood in the fingertips show changes in glucose levels more quickly than blood in other parts of the body. This means that alternative site test results may be different from fingertip test results, not because of the meter's ability to test accurately, but because the actual glucose concentration can be different.  Continuous Glucose Monitoring Devices to measure your blood glucose continuously are available, and others are in development. These methods can be more expensive than self-monitoring with a glucose meter. However, it is uncertain how effective and reliable these devices are. Your caregiver will advise you if this approach makes sense for you. IF BLOOD SUGARS ARE CONTROLLED, PEOPLE WITH DIABETES REMAIN HEALTHIER.  SMBG is an important part of the treatment plan of patients with diabetes mellitus. Below are reasons for using SMBG:   It confirms that your glucose is at a specific, healthy level.  It detects hypoglycemia and severe hyperglycemia.  It allows you and your caregiver to make adjustments in  response to changes in lifestyle for individuals requiring medicine.  It determines the need for starting insulin therapy in temporary diabetes that happens during pregnancy (gestational diabetes). Document Released: 04/08/2003 Document Revised: 06/28/2011 Document Reviewed: 07/30/2010 [facility] Patient Information 2014 [place], [place].

## 2012-09-20 ENCOUNTER — Telehealth: Payer: Self-pay | Admitting: *Deleted

## 2012-09-20 LAB — LIPID PANEL
Cholesterol: 193 mg/dL (ref 0–200)
LDL Cholesterol: 73 mg/dL (ref 0–99)
Total CHOL/HDL Ratio: 2.7 Ratio
Triglycerides: 246 mg/dL — ABNORMAL HIGH (ref <150)
VLDL: 49 mg/dL — ABNORMAL HIGH (ref 0–40)

## 2012-09-20 LAB — COMPREHENSIVE METABOLIC PANEL
Alkaline Phosphatase: 107 U/L (ref 39–117)
CO2: 26 mEq/L (ref 19–32)
Creat: 0.88 mg/dL (ref 0.50–1.10)
Glucose, Bld: 332 mg/dL — ABNORMAL HIGH (ref 70–99)
Total Bilirubin: 0.3 mg/dL (ref 0.3–1.2)

## 2012-09-20 NOTE — Telephone Encounter (Signed)
09/20/12 Unable to reach patient at number [phone]. P>Bergen Melle,RN BSN

## 2012-09-21 ENCOUNTER — Telehealth: Payer: Self-pay | Admitting: *Deleted

## 2012-09-21 NOTE — Telephone Encounter (Signed)
Pt calling back

## 2012-09-21 NOTE — Telephone Encounter (Signed)
09/21/12 Patient made aware of lab results per Dr. Lucretia Roers note, also Patient has an appointment schedule for Monday. PWilliams,RN

## 2012-09-21 NOTE — Telephone Encounter (Signed)
09/21/12 Unable to reach patient at number provided [phone]. Message left at alternate number [phone] to have patient return Call to clinic. P.Alda Gaultney,RN

## 2012-09-25 ENCOUNTER — Ambulatory Visit: Payer: No Typology Code available for payment source | Attending: Family Medicine | Admitting: Family Medicine

## 2012-09-25 ENCOUNTER — Telehealth: Payer: Self-pay | Admitting: *Deleted

## 2012-09-25 VITALS — BP 123/77 | HR 87 | Temp 98.6°F | Resp 18 | Ht 65.0 in | Wt 226.0 lb

## 2012-09-25 DIAGNOSIS — E119 Type 2 diabetes mellitus without complications: Secondary | ICD-10-CM

## 2012-09-25 DIAGNOSIS — I10 Essential (primary) hypertension: Secondary | ICD-10-CM | POA: Insufficient documentation

## 2012-09-25 NOTE — Progress Notes (Signed)
Patient states that she has been feeling jittery in the mornings and her blood sugar has been reading 81 and 84. She decided to change the way she was taking the metformin and take only 1 at night. She states after that her blood sugar was only 94. She states that maybe the doctor will need to change how much metformin or insulin she takes to regulate her blood sugar.

## 2012-09-25 NOTE — Progress Notes (Signed)
Subjective:     Patient ID: Deborah Coffey, female   DOB: [birth year], 56 y.o.   MRN: [id number]  HPI Pt here for 1 week f/u on HTN and DM. She was just restarted on her meds last week (notes reviewed) and has been taking meds as prescribed except as noted below.   HTN - BP 123/77 today, no headaches, CP, blurred vision. No lighthededness on standing.   DM - has been checking sugars first thing in the morning and has had mostly readings in the 80s. She feels jittery and sweaty most of the day. She was prescribed 70/30 insulin 20 units twice daily and metformin 1000 BID. She has been using the insulin as prescribed but decreased her metforming to 1g in the am and 500mg  pm.    Review of Systemsper hpi     Objective:   Physical Exam  Nursing note and vitals reviewed. Constitutional: She appears well-developed and well-nourished.  Cardiovascular: Normal rate and regular rhythm.   Pulmonary/Chest: Effort normal and breath sounds normal.  Skin: Skin is warm and dry.  Psychiatric: She has a normal mood and affect.       Assessment:     Diabetes - Plan: Ambulatory referral to diabetic education HTN      Plan:     HTN - cont current treatment plan  DM - decrease insulin to 15 units 70/30 twice daily and increase metformin to 1000mg  BID - record sugars twice daily along with when she eats. Pt says she "grazes" so hard to figure out time for postprandial or premeal suagr.  - have referred to DM ed as she has not been and would like more information on how to eat "correctly"  She will call us in 1 week with her sugars and rtc in 2 weeks for re-eval She will call with any questions or concerns before that time.

## 2012-09-25 NOTE — Patient Instructions (Signed)
*   take the Metformin 1000mg  twice daily * decrease 70/30 insulin to 15 units twice daily * check your blood sugars twice daily and record them along with the time and when you ate * call us in 1 week with your sugars * return to clinic in 2 weeks * let us know if you have any questions or concerns in the meanwhile

## 2012-10-27 ENCOUNTER — Ambulatory Visit: Payer: No Typology Code available for payment source | Attending: Family Medicine | Admitting: Internal Medicine

## 2012-10-27 ENCOUNTER — Encounter: Payer: Self-pay | Admitting: Internal Medicine

## 2012-10-27 VITALS — BP 154/83 | HR 95 | Temp 98.1°F | Resp 20 | Ht 65.0 in | Wt 234.0 lb

## 2012-10-27 DIAGNOSIS — J069 Acute upper respiratory infection, unspecified: Secondary | ICD-10-CM | POA: Insufficient documentation

## 2012-10-27 MED ORDER — INSULIN ASPART PROT & ASPART (70-30 MIX) 100 UNIT/ML PEN: 20.0000 [IU] | PEN_INJECTOR | Freq: Two times a day (BID) | SUBCUTANEOUS | Status: DC

## 2012-10-27 MED ORDER — LISINOPRIL 10 MG PO TABS: 10.0000 mg | ORAL_TABLET | Freq: Every day | ORAL | Status: DC

## 2012-10-27 MED ORDER — DOXYCYCLINE HYCLATE 100 MG PO TABS: 100.0000 mg | ORAL_TABLET | Freq: Two times a day (BID) | ORAL | Status: DC

## 2012-10-27 NOTE — Progress Notes (Signed)
Patient ID: Deborah Coffey, female   DOB: [birth year], 57 y.o.   MRN: [id number]  Patient Demographics  Deborah Coffey, is a 57 y.o. female  [id number]  [id number]  DOB - April 21, 1955  Chief Complaint  Patient presents with  . URI        Subjective:   Deborah Coffey with History of hypertension, diabetes mellitus, dyslipidemia  is here for cough which is dry for the last 1 week, no fever chills or shortness of breath.  Denies any subjective complaints except as above, no active headache, no chest abdominal pain at this time, not short of breath. No focal weakness which is new.    Objective:    Patient Active Problem List   Diagnosis Date Noted  . URI, acute 10/27/2012  . Accelerated hypertension 09/19/2012  . Uncontrolled diabetes mellitus 09/19/2012  . Dyslipidemia 09/19/2012     Filed Vitals:   10/27/12 1010  BP: 154/83  Pulse: 95  Temp: 98.1 F (36.7 C)  TempSrc: Oral  Resp: 20  Height: 5\' 5"  (1.651 m)  Weight: 234 lb (106.142 kg)  SpO2: 95%     Exam   Awake Alert, Oriented X 3, No new F.N deficits, Normal affect [place].AT,PERRAL Supple Neck,No JVD, No cervical lymphadenopathy appriciated.  Symmetrical Chest wall movement, Good air movement bilaterally, CTAB RRR,No Gallops,Rubs or new Murmurs, No Parasternal Heave +ve B.Sounds, Abd Soft, Non tender, No organomegaly appriciated, No rebound - guarding or rigidity. No Cyanosis, Clubbing or edema, No new Rash or bruise       Data Review   CBC No results found for this basename: WBC, HGB, HCT, PLT, MCV, MCH, MCHC, RDW, NEUTRABS, LYMPHSABS, MONOABS, EOSABS, BASOSABS, BANDABS, BANDSABD,  in the last 168 hours  Chemistries   No results found for this basename: NA, K, CL, CO2, GLUCOSE, BUN, CREATININE, GFRCGP, CALCIUM, MG, AST, ALT, ALKPHOS, BILITOT,  in the last 168 hours ------------------------------------------------------------------------------------------------------------------ No  results found for this basename: HGBA1C,  in the last 72 hours ------------------------------------------------------------------------------------------------------------------ No results found for this basename: CHOL, HDL, LDLCALC, TRIG, CHOLHDL, LDLDIRECT,  in the last 72 hours ------------------------------------------------------------------------------------------------------------------ No results found for this basename: TSH, T4TOTAL, FREET3, T3FREE, THYROIDAB,  in the last 72 hours ------------------------------------------------------------------------------------------------------------------ No results found for this basename: VITAMINB12, FOLATE, FERRITIN, TIBC, IRON, RETICCTPCT,  in the last 72 hours  Coagulation profile  No results found for this basename: INR, PROTIME,  in the last 168 hours     Prior to Admission medications   Medication Sig Start Date End Date Taking? Authorizing Provider  aspirin 81 MG tablet Take 81 mg by mouth daily.     Yes Historical Provider, MD  hydrochlorothiazide (MICROZIDE) 12.5 MG capsule Take 1 capsule (12.5 mg total) by mouth daily. 09/19/12  Yes Clanford Cyndie Mull, MD  Insulin Aspart Prot & Aspart (NOVOLOG MIX 70/30 FLEXPEN) (70-30) 100 UNIT/ML SUPN Inject 20 Units into the skin 2 (two) times daily before a meal. 10/27/12  Yes Leroy Sea, MD  INSULIN SYRINGE .5CC/28G 28G X 1/2" 0.5 ML MISC 1 Syringe by Does not apply route as needed. 09/19/12  Yes Clanford Cyndie Mull, MD  metFORMIN (GLUCOPHAGE XR) 500 MG 24 hr tablet Take 2 tablets (1,000 mg total) by mouth 2 (two) times daily with a meal. 09/19/12  Yes Clanford Cyndie Mull, MD  metoprolol (LOPRESSOR) 50 MG tablet Take 1 tablet (50 mg total) by mouth 2 (two) times daily. 09/19/12  Yes Clanford Cyndie Mull, MD  pravastatin (PRAVACHOL) 40 MG tablet Take 1  tablet (40 mg total) by mouth daily. 09/19/12  Yes Clanford Cyndie Mull, MD  doxycycline (VIBRA-TABS) 100 MG tablet Take 1 tablet (100 mg total) by mouth 2  (two) times daily. 10/27/12   Leroy Sea, MD  lisinopril (PRINIVIL,ZESTRIL) 10 MG tablet Take 1 tablet (10 mg total) by mouth daily. 10/27/12   Leroy Sea, MD     Assessment & Plan   Acute URI. Place her on doxycycline for 7 days, patient instructed if not better by then to come back.   Diabetes mellitus type 2 , his lipidemia . All stable on present medications,   Hypertension in poor control-  lisinopril added for better blood pressure control as blood pressure is like a high   She is to come back in a month for history and physical and lab work.    Routine health maintenance.    Colonoscopy - she follows with GI physician, history of colonic polyps and gets anoscopy every 5 years  Mammogram, Pap smear - referral made  Immunizations-he is due for tetanus shot next visit, clinic is out of tetanus shot         Leroy Sea M.D on 10/27/2012 at 10:31 AM

## 2012-10-27 NOTE — Progress Notes (Signed)
Patient complains of head and chest congestion with cough x 2 weeks; denies fever/chills, diarrhea.

## 2012-10-30 ENCOUNTER — Ambulatory Visit: Payer: Self-pay

## 2012-11-02 ENCOUNTER — Ambulatory Visit: Payer: Self-pay | Attending: Family Medicine

## 2012-12-04 ENCOUNTER — Encounter: Payer: Self-pay | Admitting: Obstetrics & Gynecology

## 2012-12-04 ENCOUNTER — Ambulatory Visit (INDEPENDENT_AMBULATORY_CARE_PROVIDER_SITE_OTHER): Payer: No Typology Code available for payment source | Admitting: Obstetrics & Gynecology

## 2012-12-04 VITALS — BP 148/76 | HR 96 | Temp 97.9°F | Ht 68.0 in | Wt 236.6 lb

## 2012-12-04 DIAGNOSIS — Z01419 Encounter for gynecological examination (general) (routine) without abnormal findings: Secondary | ICD-10-CM

## 2012-12-04 DIAGNOSIS — Z1231 Encounter for screening mammogram for malignant neoplasm of breast: Secondary | ICD-10-CM

## 2012-12-04 NOTE — Progress Notes (Signed)
Patient ID: Deborah Coffey, female   DOB: May 30, 1955, 57 y.o.   MRN: [id number]  Chief Complaint  Patient presents with  . Gynecologic Exam    annual, mammogram    HPI Deborah Coffey is a 57 y.o. female.  Postmenopausal. Referred for Gyn screening. Last pap and mammogram 04/2010 nl  HPI  Past Medical History  Diagnosis Date  . Diabetes mellitus   . Allergy   . Arthritis     Past Surgical History  Procedure Laterality Date  . Cesarean section  1977 and 1989  . Coronary artery bypass graft  2005    Family History  Problem Relation Age of Onset  . Heart disease Mother     Social History History  Substance Use Topics  . Smoking status: Current Every Day Smoker -- 1.00 packs/day  . Smokeless tobacco: Not on file  . Alcohol Use: No    Allergies  Allergen Reactions  . Ciprofloxacin     Current Outpatient Prescriptions  Medication Sig Dispense Refill  . aspirin 81 MG tablet Take 81 mg by mouth daily.        Marland Kitchen doxycycline (VIBRA-TABS) 100 MG tablet Take 1 tablet (100 mg total) by mouth 2 (two) times daily.  14 tablet  0  . hydrochlorothiazide (MICROZIDE) 12.5 MG capsule Take 1 capsule (12.5 mg total) by mouth daily.  30 capsule  2  . Insulin Aspart Prot & Aspart (NOVOLOG MIX 70/30 FLEXPEN) (70-30) 100 UNIT/ML SUPN Inject 20 Units into the skin 2 (two) times daily before a meal.  2 pen  3  . INSULIN SYRINGE .5CC/28G 28G X 1/2" 0.5 ML MISC 1 Syringe by Does not apply route as needed.  100 each  6  . lisinopril (PRINIVIL,ZESTRIL) 10 MG tablet Take 1 tablet (10 mg total) by mouth daily.  30 tablet  1  . metFORMIN (GLUCOPHAGE XR) 500 MG 24 hr tablet Take 2 tablets (1,000 mg total) by mouth 2 (two) times daily with a meal.  120 tablet  2  . metoprolol (LOPRESSOR) 50 MG tablet Take 1 tablet (50 mg total) by mouth 2 (two) times daily.  60 tablet  2  . pravastatin (PRAVACHOL) 40 MG tablet Take 1 tablet (40 mg total) by mouth daily.  30 tablet  3   No current  facility-administered medications for this visit.    Review of Systems Review of Systems  Respiratory: Positive for shortness of breath.   Genitourinary: Negative for dysuria, vaginal bleeding, vaginal discharge and pelvic pain.    Blood pressure 148/76, pulse 96, temperature 97.9 F (36.6 C), temperature source Oral, height 5\' 8"  (1.727 m), weight 236 lb 9.6 oz (107.321 kg).  Physical Exam Physical Exam  Constitutional: She appears well-developed. No distress.  obese  Neck: Normal range of motion.  Pulmonary/Chest: Effort normal. No respiratory distress.  Breasts without masses or tenderness  Abdominal: Soft. She exhibits no distension and no mass. There is no tenderness.  Genitourinary: Vagina normal and uterus normal. No vaginal discharge found.  Pap done no mass  Skin: Skin is warm and dry.  Psychiatric: She has a normal mood and affect. Her behavior is normal.    Data Reviewed Pap result, meds  Assessment    Well woman exam, normal gyn postmentopause     Plan    Screening mammogram Pap 5 years if nl with cotesting        Deborah Coffey 12/04/2012, 1:59 PM

## 2012-12-04 NOTE — Patient Instructions (Signed)
Mammogram Tips Healthy women should begin getting mammograms every year or two once they reach age 56, and once a year when they reach age 50. Here are tips:  Find an experienced, high-volume center with accomplished radiologists. You can ask for their credentials.  Ask to see the certificate showing the center is approved by the U.S. Food and Drug Administration.  Use the same center regularly, so it is easier to compare your new mammograms with your old ones.  Bring a list of places you have had mammograms, dates, biopsies or other breast treatments. Bring old mammograms with you or have them sent to your primary caregiver.  Describe any breast problems to your caregiver or the person doing the mammogram. Be ready to give past surgeries, birth control pills, hormone use, breast implants, growths, moles, breast scars and family or personal history of breast cancer.  Call your doctor or center to check on the mammogram if you hear nothing within 10 days. Do not assume everything was normal.  To protect your privacy, the mammogram results cannot be given over the phone or to anyone but you.  Radiation from a mammogram is very low and does not pose a radiation risk.  Mammograms can detect breast problems other than breast cancer.  You may be asked stand or sit in front of the X-ray machine.  Two small plastic or glass plates are placed around the breast when taking the X-ray.  If you are menstruating, schedule your mammogram a week after your menstrual period.  Do not wear deodorants, powder or perfume when getting a mammogram.  Wash your breasts and under your arms before getting a mammogram.  Wear cloths that are easy for you to undress and dress.  Arrive at the center at least 15 minutes before the mammogram is scheduled.  There may be slight discomfort during the mammogram, but it goes away shortly after the test.  Try to relax as much as possible during the mammogram.  Talk  to your caregiver if you do not understand the results of the mammogram.  Follow the recommendations of your caregiver regarding further tests and treatments if needed.  Get a second opinion if you are concerned or question the results of the mammogram, further tests or treatment if needed.  Continue with monthly self-breast exams and yearly caregiver exams even if the mammogram is normal.  Your caregiver may recommend getting a mammogram before age 56 and more often if you are at high risk for developing breast cancer. Document Released: 07/22/2005 Document Revised: 06/28/2011 Document Reviewed: 03/29/2008 Exit[employer] Patient Information 2014 [employer].  

## 2012-12-13 ENCOUNTER — Ambulatory Visit (HOSPITAL_COMMUNITY)
Admission: RE | Admit: 2012-12-13 | Discharge: 2012-12-13 | Disposition: A | Discharge status: Home / Self Care | Payer: No Typology Code available for payment source | Source: Ambulatory Visit | Attending: Obstetrics & Gynecology | Admitting: Obstetrics & Gynecology

## 2012-12-13 ENCOUNTER — Other Ambulatory Visit: Payer: Self-pay | Admitting: Obstetrics & Gynecology

## 2012-12-13 DIAGNOSIS — Z01419 Encounter for gynecological examination (general) (routine) without abnormal findings: Secondary | ICD-10-CM

## 2012-12-13 DIAGNOSIS — Z1231 Encounter for screening mammogram for malignant neoplasm of breast: Secondary | ICD-10-CM | POA: Insufficient documentation

## 2012-12-29 ENCOUNTER — Ambulatory Visit: Payer: No Typology Code available for payment source | Attending: Internal Medicine | Admitting: Internal Medicine

## 2012-12-29 VITALS — BP 126/78 | HR 74 | Temp 98.4°F | Resp 15 | Wt 238.0 lb

## 2012-12-29 DIAGNOSIS — J069 Acute upper respiratory infection, unspecified: Secondary | ICD-10-CM | POA: Insufficient documentation

## 2012-12-29 DIAGNOSIS — E785 Hyperlipidemia, unspecified: Secondary | ICD-10-CM | POA: Insufficient documentation

## 2012-12-29 DIAGNOSIS — F17200 Nicotine dependence, unspecified, uncomplicated: Secondary | ICD-10-CM | POA: Insufficient documentation

## 2012-12-29 DIAGNOSIS — IMO0001 Reserved for inherently not codable concepts without codable children: Secondary | ICD-10-CM | POA: Insufficient documentation

## 2012-12-29 DIAGNOSIS — Z79899 Other long term (current) drug therapy: Secondary | ICD-10-CM | POA: Insufficient documentation

## 2012-12-29 DIAGNOSIS — R0989 Other specified symptoms and signs involving the circulatory and respiratory systems: Secondary | ICD-10-CM | POA: Insufficient documentation

## 2012-12-29 DIAGNOSIS — Z794 Long term (current) use of insulin: Secondary | ICD-10-CM | POA: Insufficient documentation

## 2012-12-29 DIAGNOSIS — I10 Essential (primary) hypertension: Secondary | ICD-10-CM | POA: Insufficient documentation

## 2012-12-29 DIAGNOSIS — Z7982 Long term (current) use of aspirin: Secondary | ICD-10-CM | POA: Insufficient documentation

## 2012-12-29 LAB — COMPREHENSIVE METABOLIC PANEL
Alkaline Phosphatase: 110 U/L (ref 39–117)
BUN: 15 mg/dL (ref 6–23)
Creat: 0.89 mg/dL (ref 0.50–1.10)
Glucose, Bld: 280 mg/dL — ABNORMAL HIGH (ref 70–99)
Sodium: 136 mEq/L (ref 135–145)
Total Bilirubin: 0.3 mg/dL (ref 0.3–1.2)
Total Protein: 6.7 g/dL (ref 6.0–8.3)

## 2012-12-29 LAB — LIPID PANEL
Cholesterol: 157 mg/dL (ref 0–200)
HDL: 59 mg/dL (ref >39)
LDL Cholesterol: 68 mg/dL (ref 0–99)
Total CHOL/HDL Ratio: 2.7 Ratio
Triglycerides: 149 mg/dL (ref <150)
VLDL: 30 mg/dL (ref 0–40)

## 2012-12-29 LAB — CBC
Hemoglobin: 11.9 g/dL — ABNORMAL LOW (ref 12.0–15.0)
MCH: 27.9 pg (ref 26.0–34.0)
MCHC: 33 g/dL (ref 30.0–36.0)
MCV: 84.5 fL (ref 78.0–100.0)

## 2012-12-29 MED ORDER — AZITHROMYCIN 250 MG PO TABS: ORAL_TABLET | ORAL | Status: DC

## 2012-12-29 MED ORDER — FLUTICASONE PROPIONATE 50 MCG/ACT NA SUSP: 2.0000 | Freq: Every day | NASAL | Status: DC

## 2012-12-29 NOTE — Patient Instructions (Addendum)

## 2012-12-29 NOTE — Progress Notes (Signed)
Patient ID: Deborah Coffey, female   DOB: May 20, 1955, 57 y.o.   MRN: [id number]   CC: Congestion in the chest  HPI: Patient is 57 year old female with diabetes, hypertension, hyperlipidemia who presents to clinic with main concern of progressively worsening chest congestion, associated with productive cough of clear to yellow sputum, subjective fevers and chills. She explains this started several days prior to this admission and has been getting worse. Her shortness of breath is mostly present with exertion but occasionally present with rest. No specific alleviating or aggravating factors, no specific abdominal or urinary concerns. She reports compliance with medications. Allergies  Allergen Reactions  . Ciprofloxacin    Past Medical History  Diagnosis Date  . Diabetes mellitus   . Allergy   . Arthritis    Current Outpatient Prescriptions on File Prior to Visit  Medication Sig Dispense Refill  . aspirin 81 MG tablet Take 81 mg by mouth daily.        Marland Kitchen doxycycline (VIBRA-TABS) 100 MG tablet Take 1 tablet (100 mg total) by mouth 2 (two) times daily.  14 tablet  0  . hydrochlorothiazide (MICROZIDE) 12.5 MG capsule Take 1 capsule (12.5 mg total) by mouth daily.  30 capsule  2  . Insulin Aspart Prot & Aspart (NOVOLOG MIX 70/30 FLEXPEN) (70-30) 100 UNIT/ML SUPN Inject 20 Units into the skin 2 (two) times daily before a meal.  2 pen  3  . INSULIN SYRINGE .5CC/28G 28G X 1/2" 0.5 ML MISC 1 Syringe by Does not apply route as needed.  100 each  6  . lisinopril (PRINIVIL,ZESTRIL) 10 MG tablet Take 1 tablet (10 mg total) by mouth daily.  30 tablet  1  . metFORMIN (GLUCOPHAGE XR) 500 MG 24 hr tablet Take 2 tablets (1,000 mg total) by mouth 2 (two) times daily with a meal.  120 tablet  2  . metoprolol (LOPRESSOR) 50 MG tablet Take 1 tablet (50 mg total) by mouth 2 (two) times daily.  60 tablet  2  . pravastatin (PRAVACHOL) 40 MG tablet Take 1 tablet (40 mg total) by mouth daily.  30 tablet  3   No  current facility-administered medications on file prior to visit.   Family History  Problem Relation Age of Onset  . Heart disease Mother    History   Social History  . Marital Status: Single    Spouse Name: N/A    Number of Children: N/A  . Years of Education: N/A   Occupational History  . Not on file.   Social History Main Topics  . Smoking status: Current Every Day Smoker -- 1.00 packs/day  . Smokeless tobacco: Not on file  . Alcohol Use: No  . Drug Use: No  . Sexual Activity: Not on file   Other Topics Concern  . Not on file   Social History Narrative  . No narrative on file    Review of Systems  Constitutional: Negative for  diaphoresis, activity change, appetite change and fatigue.  HENT: Negative for ear pain, nosebleeds, congestion, facial swelling, rhinorrhea, neck pain, neck stiffness and ear discharge.   Eyes: Negative for pain, discharge, redness, itching and visual disturbance.  Respiratory: Negative for choking, chest tightness, wheezing and stridor.   Cardiovascular: Negative for chest pain, palpitations and leg swelling.  Gastrointestinal: Negative for abdominal distention.  Genitourinary: Negative for dysuria, urgency, frequency, hematuria, flank pain, decreased urine volume, difficulty urinating and dyspareunia.  Musculoskeletal: Negative for back pain, joint swelling, arthralgias and gait problem.  Neurological:  Negative for dizziness, tremors, seizures, syncope, facial asymmetry, speech difficulty, weakness, light-headedness, numbness and headaches.  Hematological: Negative for adenopathy. Does not bruise/bleed easily.  Psychiatric/Behavioral: Negative for hallucinations, behavioral problems, confusion, dysphoric mood, decreased concentration and agitation.    Objective:   Filed Vitals:   12/29/12 0946  BP: 126/78  Pulse: 74  Temp: 98.4 F (36.9 C)  Resp: 15    Physical Exam  Constitutional: Appears well-developed and well-nourished. No  distress.  HENT: Normocephalic. External right and left ear normal. Oropharynx is clear and moist.  Eyes: Conjunctivae and EOM are normal. PERRLA, no scleral icterus.  Neck: Normal ROM. Neck supple. No JVD. No tracheal deviation. No thyromegaly.  CVS: RRR, S1/S2 +, no murmurs, no gallops, no carotid bruit.  Pulmonary: Effort and breath sounds normal, scattered rhonchi, cleared with cough, no wheezing Abdominal: Soft. BS +,  no distension, tenderness, rebound or guarding.  Musculoskeletal: Normal range of motion. No edema and no tenderness.  Lymphadenopathy: No lymphadenopathy noted, cervical, inguinal.   Lab Results  Component Value Date   WBC 8.6 11/21/2010   HGB 14.3 09/19/2012   HCT 42.0 09/19/2012   MCV 83.7 11/21/2010   PLT 303 11/21/2010   Lab Results  Component Value Date   CREATININE 0.88 09/19/2012   BUN 13 09/19/2012   NA 136 09/19/2012   K 4.4 09/19/2012   CL 103 09/19/2012   CO2 26 09/19/2012    Lab Results  Component Value Date   HGBA1C 12.5* 09/19/2012   Lipid Panel     Component Value Date/Time   CHOL 193 09/19/2012 1735   TRIG 246* 09/19/2012 1735   HDL 71 09/19/2012 1735   CHOLHDL 2.7 09/19/2012 1735   VLDL 49* 09/19/2012 1735   LDLCALC 73 09/19/2012 1735       Assessment and plan:   Patient Active Problem List   Diagnosis Date Noted  . URI, acute - possibly also bacterial given fevers and chills and productive cough. We'll start him course of Z-Pak and Flonase for symptom management. I have advised patient to come back if her symptoms do not improve over one to 2 weeks.  10/27/2012  . Accelerated hypertension - reasonable blood pressure on today's exam. We'll continue same medical regimen. We'll check electrolyte panel today  09/19/2012  . Uncontrolled diabetes mellitus - last A1c 12.5 in June 2014. Patient reports compliance with insulin and metformin. We'll check A1c today  09/19/2012  . Dyslipidemia - check lipid panel today, continue pravastatin  09/19/2012

## 2012-12-29 NOTE — Progress Notes (Signed)
Complains of cough congestion runny nose Started last week

## 2013-02-14 ENCOUNTER — Encounter: Payer: Self-pay | Admitting: Internal Medicine

## 2013-02-14 ENCOUNTER — Ambulatory Visit: Payer: No Typology Code available for payment source | Attending: Internal Medicine | Admitting: Internal Medicine

## 2013-02-14 VITALS — BP 137/83 | HR 90 | Temp 98.1°F | Resp 16 | Ht 65.0 in | Wt 237.0 lb

## 2013-02-14 DIAGNOSIS — E785 Hyperlipidemia, unspecified: Secondary | ICD-10-CM

## 2013-02-14 DIAGNOSIS — I10 Essential (primary) hypertension: Secondary | ICD-10-CM | POA: Insufficient documentation

## 2013-02-14 DIAGNOSIS — E1165 Type 2 diabetes mellitus with hyperglycemia: Secondary | ICD-10-CM

## 2013-02-14 MED ORDER — METOPROLOL TARTRATE 50 MG PO TABS: 50.0000 mg | ORAL_TABLET | Freq: Two times a day (BID) | ORAL | Status: DC

## 2013-02-14 MED ORDER — METFORMIN HCL ER 500 MG PO TB24: 1000.0000 mg | ORAL_TABLET | Freq: Two times a day (BID) | ORAL | Status: DC

## 2013-02-14 MED ORDER — GABAPENTIN 100 MG PO CAPS: 100.0000 mg | ORAL_CAPSULE | Freq: Every day | ORAL | Status: DC

## 2013-02-14 MED ORDER — HYDROCHLOROTHIAZIDE 12.5 MG PO CAPS: 12.5000 mg | ORAL_CAPSULE | Freq: Every day | ORAL | Status: DC

## 2013-02-14 MED ORDER — PRAVASTATIN SODIUM 40 MG PO TABS: 40.0000 mg | ORAL_TABLET | Freq: Every day | ORAL | Status: DC

## 2013-02-14 MED ORDER — ASPIRIN 81 MG PO TABS: 81.0000 mg | ORAL_TABLET | Freq: Every day | ORAL | Status: DC

## 2013-02-14 MED ORDER — DAPAGLIFLOZIN PROPANEDIOL 5 MG PO TABS: 1.0000 | ORAL_TABLET | Freq: Every day | ORAL | Status: DC

## 2013-02-14 MED ORDER — LISINOPRIL 10 MG PO TABS: 10.0000 mg | ORAL_TABLET | Freq: Every day | ORAL | Status: DC

## 2013-02-14 NOTE — Progress Notes (Unsigned)
Pt was schedualed today as a walk in. She was unable to afford the insulin medication and now her blood sugar is uncontrolled. Today her cbg is 433

## 2013-02-14 NOTE — Progress Notes (Unsigned)
Patient ID: Jannette Cotham, female   DOB: June 11, 1955, 57 y.o.   MRN: [id number] Patient Demographics  Deborah Coffey, is a 57 y.o. female  QVZ:[id number]  [id number]:[id number]  DOB - [birth year]  Chief Complaint  Patient presents with  . Follow-up        Subjective:   Deborah Coffey today is here for a follow up visit.  Spot CBG 433 patient reports that she actually has not been taking insulin since June 2014, she had not been able to afford it, patient reports that she is not able to use the insulin vial, maybe okay with a flex pen. she is only taking metformin 1000mg  twice a day  Patient has No headache, No chest pain, No abdominal pain - No Nausea, No new weakness tingling or numbness, No Cough - SOB.   Objective:    Filed Vitals:   02/14/13 1545  BP: 137/83  Pulse: 90  Temp: 98.1 F (36.7 C)  TempSrc: Oral  Resp: 16  Height: 5\' 5"  (1.651 m)  Weight: 237 lb (107.502 kg)  SpO2: 97%     ALLERGIES:   Allergies  Allergen Reactions  . Ciprofloxacin     PAST MEDICAL HISTORY: Past Medical History  Diagnosis Date  . Diabetes mellitus   . Allergy   . Arthritis     MEDICATIONS AT HOME: Prior to Admission medications   Medication Sig Start Date End Date Taking? Authorizing Provider  aspirin 81 MG tablet Take 1 tablet (81 mg total) by mouth daily. 02/14/13  Yes Ripudeep Jenna Luo, MD  hydrochlorothiazide (MICROZIDE) 12.5 MG capsule Take 1 capsule (12.5 mg total) by mouth daily. 02/14/13  Yes Ripudeep Jenna Luo, MD  lisinopril (PRINIVIL,ZESTRIL) 10 MG tablet Take 1 tablet (10 mg total) by mouth daily. 02/14/13  Yes Ripudeep Jenna Luo, MD  metFORMIN (GLUCOPHAGE XR) 500 MG 24 hr tablet Take 2 tablets (1,000 mg total) by mouth 2 (two) times daily with a meal. 02/14/13  Yes Ripudeep K Rai, MD  metoprolol (LOPRESSOR) 50 MG tablet Take 1 tablet (50 mg total) by mouth 2 (two) times daily. 02/14/13  Yes Ripudeep Jenna Luo, MD  pravastatin (PRAVACHOL) 40 MG tablet Take 1 tablet (40 mg  total) by mouth daily. 02/14/13  Yes Ripudeep Jenna Luo, MD  azithromycin (ZITHROMAX Z-PAK) 250 MG tablet Take per pharmacy instructions 12/29/12   Dorothea Ogle, MD  Dapagliflozin Propanediol (FARXIGA) 5 MG TABS Take 1 tablet by mouth daily. 02/14/13   Ripudeep Jenna Luo, MD  fluticasone (FLONASE) 50 MCG/ACT nasal spray Place 2 sprays into the nose daily. 12/29/12   Dorothea Ogle, MD  INSULIN SYRINGE .5CC/28G 28G X 1/2" 0.5 ML MISC 1 Syringe by Does not apply route as needed. 09/19/12   Clanford Cyndie Mull, MD     Exam  General appearance :Awake, alert, NAD, Speech Clear.  HEENT: Atraumatic and Normocephalic, PERLA Neck: supple, no JVD. No cervical lymphadenopathy.  Chest: Clear to auscultation bilaterally, no wheezing, rales or rhonchi CVS: S1 S2 regular, no murmurs.  Abdomen:  obese, soft, NBS, NT, ND, no gaurding, rigidity or rebound. Extremities: no cyanosis or clubbing, B/L Lower Ext shows no edema Neurology: Awake alert, and oriented X 3, CN II-XII intact, Non focal Skin: No Rash or lesions Wounds:N/A    Data Review   Basic Metabolic Panel: No results found for this basename: NA, K, CL, CO2, GLUCOSE, BUN, CREATININE, CALCIUM, MG, PHOS,  in the last 168 hours Liver Function Tests: No results found for this basename:  AST, ALT, ALKPHOS, BILITOT, PROT, ALBUMIN,  in the last 168 hours  CBC: No results found for this basename: WBC, NEUTROABS, HGB, HCT, MCV, PLT,  in the last 168 hours  ------------------------------------------------------------------------------------------------------------------ No results found for this basename: HGBA1C,  in the last 72 hours ------------------------------------------------------------------------------------------------------------------ No results found for this basename: CHOL, HDL, LDLCALC, TRIG, CHOLHDL, LDLDIRECT,  in the last 72  hours ------------------------------------------------------------------------------------------------------------------ No results found for this basename: TSH, T4TOTAL, FREET3, T3FREE, THYROIDAB,  in the last 72 hours ------------------------------------------------------------------------------------------------------------------ No results found for this basename: VITAMINB12, FOLATE, FERRITIN, TIBC, IRON, RETICCTPCT,  in the last 72 hours  Coagulation profile  No results found for this basename: INR, PROTIME,  in the last 168 hours    Assessment & Plan   Active Problems: Uncontrolled diabetes mellitus -  hemoglobin A1c was 12.5 in June 2014, she has not used any insulin since June 2014 - Will continue metformin 1000mg  BID. Had long discussion with the patient, she is somewhat overwhelmed with using insulin vial. Will start [place] 5mg  daily. Discussed with pharmacy, Marcelline Deist will be available tomorrow, patient will need to come in and pick up her prescription. - Explained to the patient that she need to check her blood sugars atleast BID, bring the log to the next visit. -  ambulatory referral for diabetic education classes   Hypertension: Refilled Lopressor, lisinopril, HCTZ  Anemia: Likely anemia of chronic disease, MCV 84.5 - check anemia panel - Colonoscopy in 2012 per patient: had some "polyps"    Followup back in 4 weeks     RAI,RIPUDEEP M.D. 02/14/2013, 4:06 PM

## 2013-03-01 ENCOUNTER — Encounter: Payer: No Typology Code available for payment source | Attending: Internal Medicine

## 2013-03-08 ENCOUNTER — Ambulatory Visit: Payer: No Typology Code available for payment source

## 2013-03-19 ENCOUNTER — Encounter (HOSPITAL_COMMUNITY): Payer: Self-pay | Admitting: Emergency Medicine

## 2013-03-19 ENCOUNTER — Emergency Department (HOSPITAL_COMMUNITY)
Admission: EM | Admit: 2013-03-19 | Discharge: 2013-03-19 | Disposition: A | Discharge status: Home / Self Care | Payer: Worker's Compensation | Attending: Emergency Medicine | Admitting: Emergency Medicine

## 2013-03-19 DIAGNOSIS — X500XXA Overexertion from strenuous movement or load, initial encounter: Secondary | ICD-10-CM | POA: Insufficient documentation

## 2013-03-19 DIAGNOSIS — E119 Type 2 diabetes mellitus without complications: Secondary | ICD-10-CM | POA: Insufficient documentation

## 2013-03-19 DIAGNOSIS — I2510 Atherosclerotic heart disease of native coronary artery without angina pectoris: Secondary | ICD-10-CM | POA: Insufficient documentation

## 2013-03-19 DIAGNOSIS — F17200 Nicotine dependence, unspecified, uncomplicated: Secondary | ICD-10-CM | POA: Insufficient documentation

## 2013-03-19 DIAGNOSIS — Z7982 Long term (current) use of aspirin: Secondary | ICD-10-CM | POA: Insufficient documentation

## 2013-03-19 DIAGNOSIS — Y9210 Unspecified residential institution as the place of occurrence of the external cause: Secondary | ICD-10-CM | POA: Insufficient documentation

## 2013-03-19 DIAGNOSIS — Y93F2 Activity, caregiving, lifting: Secondary | ICD-10-CM | POA: Insufficient documentation

## 2013-03-19 DIAGNOSIS — IMO0002 Reserved for concepts with insufficient information to code with codable children: Secondary | ICD-10-CM | POA: Insufficient documentation

## 2013-03-19 DIAGNOSIS — Z951 Presence of aortocoronary bypass graft: Secondary | ICD-10-CM | POA: Insufficient documentation

## 2013-03-19 DIAGNOSIS — S46911A Strain of unspecified muscle, fascia and tendon at shoulder and upper arm level, right arm, initial encounter: Secondary | ICD-10-CM

## 2013-03-19 DIAGNOSIS — Z79899 Other long term (current) drug therapy: Secondary | ICD-10-CM | POA: Insufficient documentation

## 2013-03-19 DIAGNOSIS — M129 Arthropathy, unspecified: Secondary | ICD-10-CM | POA: Insufficient documentation

## 2013-03-19 DIAGNOSIS — S335XXA Sprain of ligaments of lumbar spine, initial encounter: Secondary | ICD-10-CM | POA: Insufficient documentation

## 2013-03-19 DIAGNOSIS — I10 Essential (primary) hypertension: Secondary | ICD-10-CM | POA: Insufficient documentation

## 2013-03-19 DIAGNOSIS — S39012A Strain of muscle, fascia and tendon of lower back, initial encounter: Secondary | ICD-10-CM

## 2013-03-19 HISTORY — DX: Essential (primary) hypertension: I10

## 2013-03-19 MED ORDER — METHOCARBAMOL 750 MG PO TABS: 1500.0000 mg | ORAL_TABLET | Freq: Three times a day (TID) | ORAL | Status: DC

## 2013-03-19 MED ORDER — NAPROXEN 250 MG PO TABS
500.0000 mg | ORAL_TABLET | Freq: Two times a day (BID) | ORAL | Status: DC
  Administered 2013-03-19: 500 mg via ORAL
  Filled 2013-03-19: qty 2

## 2013-03-19 MED ORDER — NAPROXEN 500 MG PO TABS: 500.0000 mg | ORAL_TABLET | Freq: Two times a day (BID) | ORAL | Status: DC

## 2013-03-19 NOTE — ED Notes (Signed)
Pt. 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

## 2013-03-19 NOTE — ED Provider Notes (Signed)
CSN: [id number]     Arrival date & time 03/19/13  0141 History   First MD Initiated Contact with Patient 03/19/13 0205     Chief Complaint  Patient presents with  . 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  Patient works at [employer].  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  Patient, when she had onset of pain.  No medication taken prior to arrival.  Patient denies any weakness numbness.  No bowel or bladder problems.  No prior history of same.  Patient has history of hypertension, diabetes Past Medical History  Diagnosis Date  . Diabetes mellitus   . Allergy   . Arthritis   . Coronary artery disease   . Hypertension    Past Surgical History  Procedure Laterality Date  . Cesarean section  1977 and 1989  . Coronary artery bypass graft  2005  . Cholecystectomy     Family History  Problem Relation Age of Onset  . Heart disease Mother    History  Substance Use Topics  . Smoking status: Current Every Day Smoker -- 1.00 packs/day  . Smokeless tobacco: Not on file  . Alcohol Use: No   OB History   Grav Para Term Preterm Abortions TAB SAB Ect Mult Living                 Review of Systems  See History of Present Illness; otherwise all other systems are reviewed and negative Allergies  Ciprofloxacin  Home Medications   Current Outpatient Rx  Name  Route  Sig  Dispense  Refill  . aspirin 81 MG tablet   Oral   Take 1 tablet (81 mg total) by mouth daily.   30 tablet   5   . Dapagliflozin Propanediol (FARXIGA) 5 MG TABS   Oral   Take 1 tablet by mouth daily.   30 tablet   5   . gabapentin (NEURONTIN) 100 MG capsule   Oral   Take 1 capsule (100 mg total) by mouth daily.   30 capsule   3   . hydrochlorothiazide (MICROZIDE) 12.5 MG capsule  Oral   Take 1 capsule (12.5 mg total) by mouth daily.   30 capsule   5   . INSULIN SYRINGE .5CC/28G 28G X 1/2" 0.5 ML MISC   Does not apply   1 Syringe by Does not apply route as needed.   100 each   6   . lisinopril (PRINIVIL,ZESTRIL) 10 MG tablet   Oral   Take 1 tablet (10 mg total) by mouth daily.   30 tablet   5   . metFORMIN (GLUCOPHAGE XR) 500 MG 24 hr tablet   Oral   Take 2 tablets (1,000 mg total) by mouth 2 (two) times daily with a meal.   120 tablet   5   . metoprolol (LOPRESSOR) 50 MG tablet   Oral   Take 1 tablet (50 mg total) by mouth 2 (two) times daily.   60 tablet   4   . pravastatin (PRAVACHOL) 40 MG tablet   Oral   Take 1 tablet (40 mg total) by mouth daily.   30 tablet   5   . methocarbamol (ROBAXIN-750) 750 MG tablet   Oral   Take 2 tablets (1,500 mg total) by mouth 3 (three) times daily.   30  tablet   0   . naproxen (NAPROSYN) 500 MG tablet   Oral   Take 1 tablet (500 mg total) by mouth 2 (two) times daily with a meal.   30 tablet   0    BP 150/77  Pulse 88  Temp(Src) 97.9 F (36.6 C) (Oral)  Resp 16  SpO2 99% Physical Exam  Nursing note and vitals reviewed. Constitutional: She is oriented to person, place, and time. She appears well-developed and well-nourished. No distress.  HENT:  Head: Normocephalic and atraumatic.  Right Ear: External ear normal.  Left Ear: External ear normal.  Nose: Nose normal.  Mouth/Throat: Oropharynx is clear and moist.  Eyes: Conjunctivae and EOM are normal. Pupils are equal, round, and reactive to light.  Neck: Normal range of motion. Neck supple. No JVD present. No tracheal deviation present. No thyromegaly present.  Cardiovascular: Normal rate, regular rhythm, normal heart sounds and intact distal pulses.  Exam reveals no gallop and no friction rub.   No murmur heard. Pulmonary/Chest: Effort normal and breath sounds normal. No stridor. No respiratory distress. She has no wheezes. She has no rales.  She exhibits no tenderness.  Abdominal: Soft. Bowel sounds are normal. She exhibits no distension and no mass. There is no tenderness. There is no rebound and no guarding.  Musculoskeletal: Normal range of motion. She exhibits tenderness (patient has tenderness to right trapezius and right paraspinal muscles.  She has full range of motion of back and right shoulder.  Patient is distally neurovascularly intact.). She exhibits no edema.  Lymphadenopathy:    She has no cervical adenopathy.  Neurological: She is alert and oriented to person, place, and time. She has normal reflexes. She exhibits normal muscle tone. Coordination normal.  Skin: Skin is warm and dry. No rash noted. No erythema. No pallor.  Psychiatric: She has a normal mood and affect. Her behavior is normal. Judgment and thought content normal.    ED Course  Procedures (including critical care time) Labs Review Labs Reviewed - No data to display Imaging Review No results found.  EKG Interpretation   None       MDM   1. Muscle strain of shoulder region, right, initial encounter   2. Back strain, initial encounter    57 year old female with muscle strain.  We'll start on Naprosyn and Robaxin.  Will have her followup with her primary care Dr.    Olivia Mackie, MD 03/19/13 [phone]

## 2013-03-19 NOTE — ED Notes (Signed)
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 Pt ambulatory.

## 2013-03-20 ENCOUNTER — Encounter: Payer: Self-pay | Admitting: Internal Medicine

## 2013-03-20 ENCOUNTER — Ambulatory Visit: Payer: No Typology Code available for payment source | Attending: Internal Medicine | Admitting: Internal Medicine

## 2013-03-20 VITALS — BP 137/83 | HR 76 | Temp 98.4°F | Resp 16 | Wt 235.8 lb

## 2013-03-20 DIAGNOSIS — E119 Type 2 diabetes mellitus without complications: Secondary | ICD-10-CM

## 2013-03-20 DIAGNOSIS — E1165 Type 2 diabetes mellitus with hyperglycemia: Secondary | ICD-10-CM

## 2013-03-20 DIAGNOSIS — I10 Essential (primary) hypertension: Secondary | ICD-10-CM

## 2013-03-20 DIAGNOSIS — Z139 Encounter for screening, unspecified: Secondary | ICD-10-CM

## 2013-03-20 MED ORDER — DAPAGLIFLOZIN PROPANEDIOL 10 MG PO TABS: 10.0000 mg | ORAL_TABLET | Freq: Every day | ORAL | Status: DC

## 2013-03-20 NOTE — Progress Notes (Signed)
Patient ID: Deborah Coffey, female   DOB: May 19, 1955, 57 y.o.   MRN: [id number] MRN: [id number] Name: Deborah Coffey  Sex: female Age: 57 y.o. DOB: 1956-01-18  Allergies: Ciprofloxacin  Chief Complaint  Patient presents with  . Diabetes    HPI: Patient is 57 y.o. female who comes today for followup her has history of diabetes, on the last visit she was started on farxiga milligram patient is also already on metformin thousand milligram twice a day, as per patient have her fasting sugar has improved but still it's more than 200 mg/dl, patient denies any hypoglycemic symptoms, today her hemoglobin A1c is 10.5 percent.  Past Medical History  Diagnosis Date  . Diabetes mellitus   . Allergy   . Arthritis   . Coronary artery disease   . Hypertension     Past Surgical History  Procedure Laterality Date  . Cesarean section  1977 and 1989  . Coronary artery bypass graft  2005  . Cholecystectomy        Medication List       This list is accurate as of: 03/20/13  3:52 PM.  Always use your most recent med list.               aspirin 81 MG tablet  Take 1 tablet (81 mg total) by mouth daily.     Dapagliflozin Propanediol 10 MG Tabs  Commonly known as:  FARXIGA  Take 10 mg by mouth daily.     gabapentin 100 MG capsule  Commonly known as:  NEURONTIN  Take 1 capsule (100 mg total) by mouth daily.     hydrochlorothiazide 12.5 MG capsule  Commonly known as:  MICROZIDE  Take 1 capsule (12.5 mg total) by mouth daily.     lisinopril 10 MG tablet  Commonly known as:  PRINIVIL,ZESTRIL  Take 1 tablet (10 mg total) by mouth daily.     metFORMIN 500 MG 24 hr tablet  Commonly known as:  GLUCOPHAGE XR  Take 2 tablets (1,000 mg total) by mouth 2 (two) times daily with a meal.     methocarbamol 750 MG tablet  Commonly known as:  ROBAXIN-750  Take 2 tablets (1,500 mg total) by mouth 3 (three) times daily.     metoprolol 50 MG tablet  Commonly known as:  LOPRESSOR  Take 1  tablet (50 mg total) by mouth 2 (two) times daily.     naproxen 500 MG tablet  Commonly known as:  NAPROSYN  Take 1 tablet (500 mg total) by mouth 2 (two) times daily with a meal.     pravastatin 40 MG tablet  Commonly known as:  PRAVACHOL  Take 1 tablet (40 mg total) by mouth daily.        Meds ordered this encounter  Medications  . Dapagliflozin Propanediol (FARXIGA) 10 MG TABS    Sig: Take 10 mg by mouth daily.    Dispense:  30 tablet    Refill:  3     There is no immunization history on file for this patient.  History  Substance Use Topics  . Smoking status: Current Every Day Smoker -- 1.00 packs/day  . Smokeless tobacco: Not on file  . Alcohol Use: No    Review of Systems  As noted in HPI  Filed Vitals:   03/20/13 1517  BP: 137/83  Pulse: 76  Temp: 98.4 F (36.9 C)  Resp: 16    Physical Exam  Physical Exam  Constitutional: She is oriented to person,  place, and time. No distress.  Eyes: EOM are normal. Pupils are equal, round, and reactive to light.  Cardiovascular: Normal rate and regular rhythm.   Pulmonary/Chest: Breath sounds normal. No respiratory distress. She has no wheezes. She has no rales.  Neurological: She is alert and oriented to person, place, and time.    CBC    Component Value Date/Time   WBC 8.2 12/29/2012 1016   RBC 4.27 12/29/2012 1016   HGB 11.9* 12/29/2012 1016   HCT 36.1 12/29/2012 1016   PLT 353 12/29/2012 1016   MCV 84.5 12/29/2012 1016   LYMPHSABS 2.3 11/21/2010 2027   MONOABS 0.5 11/21/2010 2027   EOSABS 0.1 11/21/2010 2027   BASOSABS 0.0 11/21/2010 2027    CMP     Component Value Date/Time   NA 136 12/29/2012 1016   K 4.7 12/29/2012 1016   CL 104 12/29/2012 1016   CO2 26 12/29/2012 1016   GLUCOSE 280* 12/29/2012 1016   BUN 15 12/29/2012 1016   CREATININE 0.89 12/29/2012 1016   CREATININE 0.80 09/19/2012 1604   CALCIUM 9.6 12/29/2012 1016   PROT 6.7 12/29/2012 1016   ALBUMIN 3.8 12/29/2012 1016   AST 13 12/29/2012 1016   ALT 16  12/29/2012 1016   ALKPHOS 110 12/29/2012 1016   BILITOT 0.3 12/29/2012 1016   GFRNONAA >60 11/21/2010 2027   GFRAA >60 11/21/2010 2027    Lab Results  Component Value Date/Time   CHOL 157 12/29/2012 10:16 AM    No components found with this basename: hga1c    Lab Results  Component Value Date/Time   AST 13 12/29/2012 10:16 AM    Assessment and Plan  DM (diabetes mellitus) - Plan: Glucose (CBG), HgB A1c 10.5%, COMPLETE METABOLIC PANEL WITH GFR, Lipid panel,  (FARXIGA) increased to  10 MG TABS  Essential hypertension, benign ... continue with lisinopril 10 mg daily, low salt diet  Screening - Plan: Vit D  25 hydroxy (rtn osteoporosis monitoring)   Return in about 3 months (around 06/18/2013).  Doris Cheadle, MD

## 2013-03-20 NOTE — Progress Notes (Signed)
Patient here for follow up on DM Needs medication refill

## 2013-03-21 ENCOUNTER — Ambulatory Visit: Payer: Self-pay

## 2013-03-21 NOTE — Patient Instructions (Signed)
Pt was urged to go to the E.R. or the urgent care.

## 2013-03-22 ENCOUNTER — Encounter (HOSPITAL_COMMUNITY): Payer: Self-pay | Admitting: Emergency Medicine

## 2013-03-22 ENCOUNTER — Observation Stay (HOSPITAL_COMMUNITY)
Admission: EM | Admit: 2013-03-22 | Discharge: 2013-03-23 | Disposition: A | Discharge status: Home / Self Care | Payer: No Typology Code available for payment source | Attending: Internal Medicine | Admitting: Internal Medicine

## 2013-03-22 ENCOUNTER — Emergency Department (HOSPITAL_COMMUNITY): Payer: No Typology Code available for payment source

## 2013-03-22 DIAGNOSIS — F17200 Nicotine dependence, unspecified, uncomplicated: Secondary | ICD-10-CM | POA: Insufficient documentation

## 2013-03-22 DIAGNOSIS — I10 Essential (primary) hypertension: Secondary | ICD-10-CM

## 2013-03-22 DIAGNOSIS — J069 Acute upper respiratory infection, unspecified: Secondary | ICD-10-CM

## 2013-03-22 DIAGNOSIS — R072 Precordial pain: Principal | ICD-10-CM | POA: Insufficient documentation

## 2013-03-22 DIAGNOSIS — R0789 Other chest pain: Secondary | ICD-10-CM | POA: Diagnosis present

## 2013-03-22 DIAGNOSIS — Z79899 Other long term (current) drug therapy: Secondary | ICD-10-CM | POA: Insufficient documentation

## 2013-03-22 DIAGNOSIS — E119 Type 2 diabetes mellitus without complications: Secondary | ICD-10-CM

## 2013-03-22 DIAGNOSIS — E785 Hyperlipidemia, unspecified: Secondary | ICD-10-CM

## 2013-03-22 DIAGNOSIS — E1165 Type 2 diabetes mellitus with hyperglycemia: Secondary | ICD-10-CM

## 2013-03-22 DIAGNOSIS — R079 Chest pain, unspecified: Secondary | ICD-10-CM

## 2013-03-22 DIAGNOSIS — M129 Arthropathy, unspecified: Secondary | ICD-10-CM | POA: Insufficient documentation

## 2013-03-22 DIAGNOSIS — Z7982 Long term (current) use of aspirin: Secondary | ICD-10-CM | POA: Insufficient documentation

## 2013-03-22 DIAGNOSIS — Z951 Presence of aortocoronary bypass graft: Secondary | ICD-10-CM | POA: Insufficient documentation

## 2013-03-22 DIAGNOSIS — IMO0002 Reserved for concepts with insufficient information to code with codable children: Secondary | ICD-10-CM | POA: Diagnosis present

## 2013-03-22 DIAGNOSIS — I2510 Atherosclerotic heart disease of native coronary artery without angina pectoris: Secondary | ICD-10-CM

## 2013-03-22 HISTORY — DX: Type 2 diabetes mellitus without complications: E11.9

## 2013-03-22 HISTORY — DX: Anemia, unspecified: D64.9

## 2013-03-22 HISTORY — DX: Pure hypercholesterolemia, unspecified: E78.00

## 2013-03-22 HISTORY — DX: Personal history of other medical treatment: Z92.89

## 2013-03-22 HISTORY — DX: Atherosclerotic heart disease of native coronary artery without angina pectoris: I25.10

## 2013-03-22 HISTORY — DX: Presence of aortocoronary bypass graft: Z95.1

## 2013-03-22 LAB — COMPREHENSIVE METABOLIC PANEL
Albumin: 3.6 g/dL (ref 3.5–5.2)
BUN: 15 mg/dL (ref 6–23)
Creatinine, Ser: 0.75 mg/dL (ref 0.50–1.10)
GFR calc Af Amer: >90 mL/min (ref >90)
Glucose, Bld: 189 mg/dL — ABNORMAL HIGH (ref 70–99)
Total Bilirubin: 0.1 mg/dL — ABNORMAL LOW (ref 0.3–1.2)
Total Protein: 7.4 g/dL (ref 6.0–8.3)

## 2013-03-22 LAB — CBC
HCT: 38.8 % (ref 36.0–46.0)
Hemoglobin: 12.7 g/dL (ref 12.0–15.0)
MCH: 28.2 pg (ref 26.0–34.0)
MCHC: 32.7 g/dL (ref 30.0–36.0)
MCV: 86.2 fL (ref 78.0–100.0)
RDW: 14 % (ref 11.5–15.5)

## 2013-03-22 LAB — POCT I-STAT TROPONIN I: Troponin i, poc: 0 ng/mL (ref 0.00–0.08)

## 2013-03-22 LAB — LIPASE, BLOOD: Lipase: 33 U/L (ref 11–59)

## 2013-03-22 LAB — TROPONIN I: Troponin I: <0.3 ng/mL (ref <0.30)

## 2013-03-22 LAB — GLUCOSE, CAPILLARY: Glucose-Capillary: 317 mg/dL — ABNORMAL HIGH (ref 70–99)

## 2013-03-22 MED ORDER — ENOXAPARIN SODIUM 40 MG/0.4ML ~~LOC~~ SOLN
40.0000 mg | SUBCUTANEOUS | Status: DC
  Administered 2013-03-22 – 2013-03-23 (×2): 40 mg via SUBCUTANEOUS
  Filled 2013-03-22 (×2): qty 0.4

## 2013-03-22 MED ORDER — METHOCARBAMOL 750 MG PO TABS
1500.0000 mg | ORAL_TABLET | Freq: Three times a day (TID) | ORAL | Status: DC | PRN
  Administered 2013-03-22 – 2013-03-23 (×2): 1500 mg via ORAL
  Filled 2013-03-22 (×2): qty 2

## 2013-03-22 MED ORDER — SIMVASTATIN 20 MG PO TABS
20.0000 mg | ORAL_TABLET | Freq: Every day | ORAL | Status: DC
  Administered 2013-03-23: 20 mg via ORAL
  Filled 2013-03-22: qty 1

## 2013-03-22 MED ORDER — NITROGLYCERIN 0.4 MG SL SUBL
0.4000 mg | SUBLINGUAL_TABLET | SUBLINGUAL | Status: DC | PRN
  Administered 2013-03-22: 0.4 mg via SUBLINGUAL

## 2013-03-22 MED ORDER — ACETAMINOPHEN 325 MG PO TABS: 650.0000 mg | ORAL_TABLET | Freq: Four times a day (QID) | ORAL | Status: DC | PRN

## 2013-03-22 MED ORDER — MORPHINE SULFATE 2 MG/ML IJ SOLN: 2.0000 mg | INTRAMUSCULAR | Status: DC | PRN

## 2013-03-22 MED ORDER — INSULIN ASPART 100 UNIT/ML ~~LOC~~ SOLN
0.0000 [IU] | Freq: Three times a day (TID) | SUBCUTANEOUS | Status: DC
  Administered 2013-03-23: 7 [IU] via SUBCUTANEOUS
  Administered 2013-03-23: 2 [IU] via SUBCUTANEOUS
  Administered 2013-03-23: 1 [IU] via SUBCUTANEOUS

## 2013-03-22 MED ORDER — INSULIN ASPART 100 UNIT/ML ~~LOC~~ SOLN
4.0000 [IU] | Freq: Once | SUBCUTANEOUS | Status: AC
  Administered 2013-03-22: 4 [IU] via SUBCUTANEOUS

## 2013-03-22 MED ORDER — SODIUM CHLORIDE 0.9 % IJ SOLN
3.0000 mL | Freq: Two times a day (BID) | INTRAMUSCULAR | Status: DC
  Administered 2013-03-22 – 2013-03-23 (×2): 3 mL via INTRAVENOUS

## 2013-03-22 MED ORDER — INSULIN GLARGINE 100 UNIT/ML ~~LOC~~ SOLN
10.0000 [IU] | Freq: Every day | SUBCUTANEOUS | Status: DC
  Administered 2013-03-22: 10 [IU] via SUBCUTANEOUS
  Filled 2013-03-22 (×2): qty 0.1

## 2013-03-22 MED ORDER — OXYCODONE HCL 5 MG PO TABS: 5.0000 mg | ORAL_TABLET | ORAL | Status: DC | PRN

## 2013-03-22 MED ORDER — GABAPENTIN 100 MG PO CAPS
100.0000 mg | ORAL_CAPSULE | Freq: Every day | ORAL | Status: DC
  Administered 2013-03-22 – 2013-03-23 (×2): 100 mg via ORAL
  Filled 2013-03-22 (×2): qty 1

## 2013-03-22 MED ORDER — LISINOPRIL 10 MG PO TABS
10.0000 mg | ORAL_TABLET | Freq: Every day | ORAL | Status: DC
  Administered 2013-03-22 – 2013-03-23 (×2): 10 mg via ORAL
  Filled 2013-03-22 (×2): qty 1

## 2013-03-22 MED ORDER — PANTOPRAZOLE SODIUM 40 MG PO TBEC
40.0000 mg | DELAYED_RELEASE_TABLET | Freq: Every day | ORAL | Status: DC
  Administered 2013-03-22 – 2013-03-23 (×2): 40 mg via ORAL
  Filled 2013-03-22 (×2): qty 1

## 2013-03-22 MED ORDER — HYDROCHLOROTHIAZIDE 12.5 MG PO CAPS
12.5000 mg | ORAL_CAPSULE | Freq: Every day | ORAL | Status: DC
  Administered 2013-03-22 – 2013-03-23 (×2): 12.5 mg via ORAL
  Filled 2013-03-22 (×2): qty 1

## 2013-03-22 MED ORDER — ONDANSETRON HCL 4 MG PO TABS: 4.0000 mg | ORAL_TABLET | Freq: Four times a day (QID) | ORAL | Status: DC | PRN

## 2013-03-22 MED ORDER — ONDANSETRON HCL 4 MG/2ML IJ SOLN: 4.0000 mg | Freq: Four times a day (QID) | INTRAMUSCULAR | Status: DC | PRN

## 2013-03-22 MED ORDER — ACETAMINOPHEN 650 MG RE SUPP: 650.0000 mg | Freq: Four times a day (QID) | RECTAL | Status: DC | PRN

## 2013-03-22 MED ORDER — ASPIRIN 325 MG PO TABS
325.0000 mg | ORAL_TABLET | Freq: Every day | ORAL | Status: DC
  Administered 2013-03-22 – 2013-03-23 (×2): 325 mg via ORAL
  Filled 2013-03-22 (×2): qty 1

## 2013-03-22 MED ORDER — METOPROLOL TARTRATE 50 MG PO TABS
50.0000 mg | ORAL_TABLET | Freq: Two times a day (BID) | ORAL | Status: DC
  Administered 2013-03-22 – 2013-03-23 (×2): 50 mg via ORAL
  Filled 2013-03-22 (×3): qty 1

## 2013-03-22 MED ORDER — DAPAGLIFLOZIN PROPANEDIOL 10 MG PO TABS: 10.0000 mg | ORAL_TABLET | Freq: Every day | ORAL | Status: DC

## 2013-03-22 NOTE — H&P (Addendum)
[facility] History and Physical  Deborah Coffey WUJ:[id number] DOB: [birth year] DOA: 03/22/2013  Referring physician:  PCP: Jeanann Lewandowsky, MD  Specialists:   Chief Complaint: chest pain   HPI: Deborah Coffey is a 57 y.o. female with PMH of HTN, DM, HPL, CAD s/p CABG presented with substernal intermittent non-exertional chest pain, associated with some diaphoresis and dizziness; denies SOB, no fever, no cough, no nausea, vomiting opr diarrhea; but she also reports some epigastric pain;   Review of Systems: The patient denies anorexia, fever, weight loss,, vision loss, decreased hearing, hoarseness,  syncope,  balance deficits, hemoptysis, abdominal pain, melena, hematochezia, severe indigestion/heartburn, hematuria, incontinence, genital sores, muscle weakness, suspicious skin lesions, transient blindness, difficulty walking, depression, unusual weight change, abnormal bleeding, enlarged lymph nodes, angioedema, and breast masses.    Past Medical History  Diagnosis Date  . Diabetes mellitus   . Allergy   . Arthritis   . Coronary artery disease   . Hypertension    Past Surgical History  Procedure Laterality Date  . Cesarean section  1977 and 1989  . Coronary artery bypass graft  2005  . Cholecystectomy     Social History:  reports that she has been smoking.  She does not have any smokeless tobacco history on file. She reports that she does not drink alcohol or use illicit drugs. Home: where does patient live--home, ALF, SNF? and with whom if at home? Yes: Can patient participate in ADLs?  Allergies  Allergen Reactions  . Ciprofloxacin Rash    Family History  Problem Relation Age of Onset  . Heart disease Mother     (be sure to complete)  Prior to Admission medications   Medication Sig Start Date End Date Taking? Authorizing Provider  aspirin 81 MG tablet Take 1 tablet (81 mg total) by mouth daily. 02/14/13  Yes Ripudeep Jenna Luo, MD  Dapagliflozin  Propanediol (FARXIGA) 10 MG TABS Take 10 mg by mouth daily. 03/20/13  Yes Doris Cheadle, MD  gabapentin (NEURONTIN) 100 MG capsule Take 1 capsule (100 mg total) by mouth daily. 02/14/13  Yes Ripudeep Jenna Luo, MD  hydrochlorothiazide (MICROZIDE) 12.5 MG capsule Take 1 capsule (12.5 mg total) by mouth daily. 02/14/13  Yes Ripudeep Jenna Luo, MD  lisinopril (PRINIVIL,ZESTRIL) 10 MG tablet Take 1 tablet (10 mg total) by mouth daily. 02/14/13  Yes Ripudeep Jenna Luo, MD  metFORMIN (GLUCOPHAGE XR) 500 MG 24 hr tablet Take 2 tablets (1,000 mg total) by mouth 2 (two) times daily with a meal. 02/14/13  Yes Ripudeep K Rai, MD  methocarbamol (ROBAXIN-750) 750 MG tablet Take 2 tablets (1,500 mg total) by mouth 3 (three) times daily. 03/19/13  Yes Olivia Mackie, MD  metoprolol (LOPRESSOR) 50 MG tablet Take 1 tablet (50 mg total) by mouth 2 (two) times daily. 02/14/13  Yes Ripudeep Jenna Luo, MD  naproxen (NAPROSYN) 500 MG tablet Take 1 tablet (500 mg total) by mouth 2 (two) times daily with a meal. 03/19/13  Yes Olivia Mackie, MD  pravastatin (PRAVACHOL) 40 MG tablet Take 1 tablet (40 mg total) by mouth daily. 02/14/13  Yes Ripudeep Jenna Luo, MD   Physical Exam: Filed Vitals:   03/22/13 1545  BP: 124/68  Pulse: 68  Temp:   Resp: 26     General:  alert  Eyes: eom-i  ENT: no oral ulcers  Neck: supple   Cardiovascular: s1,s2 rrr  Respiratory: CTA BL  Abdomen: soft, nt, nd   Skin: no rash  Musculoskeletal: no LE edema  Psychiatric: no hallucinations  Neurologic: CN 2-12 intact, motor 5/5 symmetric   Labs on Admission:  Basic Metabolic Panel:  Recent Labs Lab 03/22/13 1433  NA 139  K 4.4  CL 104  CO2 26  GLUCOSE 189*  BUN 15  CREATININE 0.75  CALCIUM 9.7   Liver Function Tests:  Recent Labs Lab 03/22/13 1433  AST 21  ALT 28  ALKPHOS 116  BILITOT 0.1*  PROT 7.4  ALBUMIN 3.6   No results found for this basename: LIPASE, AMYLASE,  in the last 168 hours No results found for this basename:  AMMONIA,  in the last 168 hours CBC:  Recent Labs Lab 03/22/13 1433  WBC 6.8  HGB 12.7  HCT 38.8  MCV 86.2  PLT 304   Cardiac Enzymes: No results found for this basename: CKTOTAL, CKMB, CKMBINDEX, TROPONINI,  in the last 168 hours  BNP (last 3 results) No results found for this basename: PROBNP,  in the last 8760 hours CBG: No results found for this basename: GLUCAP,  in the last 168 hours  Radiological Exams on Admission: Dg Chest 2 View  03/22/2013   CLINICAL DATA:  Chest pain  EXAM: CHEST  2 VIEW  COMPARISON:  11/21/2010  FINDINGS: The patient is status post median sternotomy. The cardiac silhouette is within normal limits. There is no evidence of focal infiltrates, effusions, nor edema. No focal reason consolidation. Multilevel spondylosis appreciated within the thoracic spine. The osseous structures otherwise unremarkable.  IMPRESSION: No active cardiopulmonary disease.   Electronically Signed   By: Salome Holmes M.D.   On: 03/22/2013 15:09    EKG: Independently reviewed. NSR, no acute ST changes   Assessment/Plan Principal Problem:   Chest pain Active Problems:   HTN (hypertension)   CAD (coronary artery disease)  57 y.o. female with PMH of HTN, DM, HPL, CAD s/p CABG presented with substernal intermittent non-exertional chest pain, associated with some diaphoresis and dizziness;  1. Chest pain atypical, but significant risk factors; initial trop neg; she is chest pain free at t he time of exam;  -serial troponins, ECG; c/s cardiology  2. CAD s/p CABG; cont ASA, BB, statin  3. DM uncontrolled; HA1C-10.5 -start insulin lantus; +ISS; may need home insulin regimen; hold metformin while inpatient   4. HTN cont home regimen   5. Probable GERD, NSAID exposure; start PPI, hold NSAIDs, check lipase   Cardiology: if consultant consulted, please document name and whether formally or informally consulted  Code Status: full (must indicate code status--if unknown or must  be presumed, indicate so) Family Communication: none at the bedside (indicate person spoken with, if applicable, with phone number if by telephone) Disposition Plan: home in 24-48 hours (indicate anticipated LOS)  Time spent: >35 minutes   Esperanza Sheets [facility] Pager [phone]  If 7PM-7AM, please contact night-coverage [email or web address] Password [email or web address] 03/22/2013, 4:50 PM

## 2013-03-22 NOTE — ED Provider Notes (Signed)
CSN: [id number]     Arrival date & time 03/22/13  1408 History   First MD Initiated Contact with Patient 03/22/13 1513     Chief Complaint  Patient presents with  . Chest Pain    hx of bipass   (Consider location/radiation/quality/duration/timing/severity/associated sxs/prior Treatment) Patient is a 57 y.o. female presenting with chest pain. The history is provided by the patient. No language interpreter was used.  Chest Pain Pain location:  Substernal area, epigastric and L chest Pain quality: pressure   Pain radiates to:  R jaw and L jaw Pain radiates to the back: yes   Pain severity:  Mild Onset quality:  Gradual Duration:  1 day Context: at rest   Associated symptoms: no cough, no fever, no nausea, no numbness, no orthopnea, no palpitations, no shortness of breath and not vomiting   Risk factors: coronary artery disease, hypertension and smoking    Pt is a 57 year old female with a history of CABG in 2005, CAD, HTN and DM. She reports that she has been having chest pressure since yesterday. She describes this pressure as substernal and epigastric region and radiated to her left shoulder, back and her jaw. She reports that this pressure started when she was at rest. She denies any shortness of breath, difficulty breathing, fever, recent illness, vomiting or diaphoresis. She reports that she takes an aspirin every day. She also reports that she was seen at urgent care yesterday and they told her that if her pain continues to come to the ER.    Past Medical History  Diagnosis Date  . Diabetes mellitus   . Allergy   . Arthritis   . Coronary artery disease   . Hypertension    Past Surgical History  Procedure Laterality Date  . Cesarean section  1977 and 1989  . Coronary artery bypass graft  2005  . Cholecystectomy     Family History  Problem Relation Age of Onset  . Heart disease Mother    History  Substance Use Topics  . Smoking status: Current Every Day Smoker -- 1.00  packs/day  . Smokeless tobacco: Not on file  . Alcohol Use: No   OB History   Grav Para Term Preterm Abortions TAB SAB Ect Mult Living                 Review of Systems  Constitutional: Negative for fever.  Respiratory: Negative for cough, shortness of breath and wheezing.   Cardiovascular: Positive for chest pain. Negative for palpitations and orthopnea.  Gastrointestinal: Negative for nausea and vomiting.  Neurological: Negative for numbness.  All other systems reviewed and are negative.    Allergies  Ciprofloxacin  Home Medications   Current Outpatient Rx  Name  Route  Sig  Dispense  Refill  . aspirin 81 MG tablet   Oral   Take 1 tablet (81 mg total) by mouth daily.   30 tablet   5   . Dapagliflozin Propanediol (FARXIGA) 10 MG TABS   Oral   Take 10 mg by mouth daily.   30 tablet   3   . gabapentin (NEURONTIN) 100 MG capsule   Oral   Take 1 capsule (100 mg total) by mouth daily.   30 capsule   3   . hydrochlorothiazide (MICROZIDE) 12.5 MG capsule   Oral   Take 1 capsule (12.5 mg total) by mouth daily.   30 capsule   5   . lisinopril (PRINIVIL,ZESTRIL) 10 MG tablet  Oral   Take 1 tablet (10 mg total) by mouth daily.   30 tablet   5   . metFORMIN (GLUCOPHAGE XR) 500 MG 24 hr tablet   Oral   Take 2 tablets (1,000 mg total) by mouth 2 (two) times daily with a meal.   120 tablet   5   . methocarbamol (ROBAXIN-750) 750 MG tablet   Oral   Take 2 tablets (1,500 mg total) by mouth 3 (three) times daily.   30 tablet   0   . metoprolol (LOPRESSOR) 50 MG tablet   Oral   Take 1 tablet (50 mg total) by mouth 2 (two) times daily.   60 tablet   4   . naproxen (NAPROSYN) 500 MG tablet   Oral   Take 1 tablet (500 mg total) by mouth 2 (two) times daily with a meal.   30 tablet   0   . pravastatin (PRAVACHOL) 40 MG tablet   Oral   Take 1 tablet (40 mg total) by mouth daily.   30 tablet   5    BP 145/53  Pulse 76  Temp(Src) 98.4 F (36.9 C)  (Oral)  Resp 19  Ht 5\' 6"  (1.676 m)  Wt 230 lb (104.327 kg)  BMI 37.14 kg/m2  SpO2 100% Physical Exam  Nursing note and vitals reviewed. Constitutional: She is oriented to person, place, and time. She appears well-developed and well-nourished. No distress.  HENT:  Head: Normocephalic and atraumatic.  Eyes: Conjunctivae and EOM are normal. Pupils are equal, round, and reactive to light.  Neck: Normal range of motion. Neck supple. No JVD present. No tracheal deviation present. No thyromegaly present.  Cardiovascular: Normal rate, regular rhythm, normal heart sounds and intact distal pulses.   Pulmonary/Chest: Effort normal and breath sounds normal. No respiratory distress. She has no wheezes.  Abdominal: Soft. Bowel sounds are normal. She exhibits no distension. There is no tenderness.  Musculoskeletal: Normal range of motion.  Lymphadenopathy:    She has no cervical adenopathy.  Neurological: She is alert and oriented to person, place, and time.  Skin: Skin is warm and dry.  Psychiatric: She has a normal mood and affect. Her behavior is normal. Judgment and thought content normal.    ED Course  Procedures (including critical care time) Labs Review Labs Reviewed  CBC  COMPREHENSIVE METABOLIC PANEL  POCT I-STAT TROPONIN I   Imaging Review Dg Chest 2 View  03/22/2013   CLINICAL DATA:  Chest pain  EXAM: CHEST  2 VIEW  COMPARISON:  11/21/2010  FINDINGS: The patient is status post median sternotomy. The cardiac silhouette is within normal limits. There is no evidence of focal infiltrates, effusions, nor edema. No focal reason consolidation. Multilevel spondylosis appreciated within the thoracic spine. The osseous structures otherwise unremarkable.  IMPRESSION: No active cardiopulmonary disease.   Electronically Signed   By: Salome Holmes M.D.   On: 03/22/2013 15:09    EKG Interpretation   None       MDM   1. Chest pain    Substernal chest pain with radiation to left shoulder  and jaw, started at rest yesterday. EKG, Normal Sinus rhythm with few pac's, troponin negative. Other labs and chest x-ray unremarkable. Had CABG in 2005, without any consistent cardiology follow-up. She has not seen anyone from cards in 2+ yrs. Risk factors; DM, HTN, CAD, previous CABG, every day smoker. Admit to internal medicine for R/O MI. Pt aware of plan and agrees.  Irish Elders, NP 03/22/13 [phone]

## 2013-03-22 NOTE — ED Notes (Signed)
Pt with hx of bypass surgery to ED c/o chest and back pressure since yesterday.  Denies nausea.

## 2013-03-22 NOTE — Progress Notes (Signed)
Pt c/o midsternal chest pain radiating to her left breast and back. Pain is worse with activity. Denied shortness of breath and diaphoresis. 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  Pain has continued on/off since then. VSS. Pt received robaxin with relief.

## 2013-03-22 NOTE — ED Notes (Signed)
Attempted to call report, nurse unavailable.

## 2013-03-23 ENCOUNTER — Observation Stay (HOSPITAL_COMMUNITY): Payer: No Typology Code available for payment source

## 2013-03-23 DIAGNOSIS — R079 Chest pain, unspecified: Secondary | ICD-10-CM

## 2013-03-23 DIAGNOSIS — I2510 Atherosclerotic heart disease of native coronary artery without angina pectoris: Secondary | ICD-10-CM

## 2013-03-23 DIAGNOSIS — I10 Essential (primary) hypertension: Secondary | ICD-10-CM

## 2013-03-23 DIAGNOSIS — E785 Hyperlipidemia, unspecified: Secondary | ICD-10-CM

## 2013-03-23 LAB — TROPONIN I: Troponin I: <0.3 ng/mL (ref <0.30)

## 2013-03-23 LAB — GLUCOSE, CAPILLARY
Glucose-Capillary: 191 mg/dL — ABNORMAL HIGH (ref 70–99)
Glucose-Capillary: 125 mg/dL — ABNORMAL HIGH (ref 70–99)
Glucose-Capillary: 349 mg/dL — ABNORMAL HIGH (ref 70–99)

## 2013-03-23 MED ORDER — TECHNETIUM TC 99M SESTAMIBI GENERIC - CARDIOLITE
30.0000 | Freq: Once | INTRAVENOUS | Status: AC | PRN
  Administered 2013-03-23: 30 via INTRAVENOUS

## 2013-03-23 MED ORDER — PANTOPRAZOLE SODIUM 40 MG PO TBEC: 40.0000 mg | DELAYED_RELEASE_TABLET | Freq: Every day | ORAL | Status: DC

## 2013-03-23 MED ORDER — TECHNETIUM TC 99M SESTAMIBI GENERIC - CARDIOLITE
10.0000 | Freq: Once | INTRAVENOUS | Status: AC | PRN
  Administered 2013-03-23: 10 via INTRAVENOUS

## 2013-03-23 MED ORDER — SENNOSIDES-DOCUSATE SODIUM 8.6-50 MG PO TABS
1.0000 | ORAL_TABLET | Freq: Every day | ORAL | Status: DC
  Administered 2013-03-23: 1 via ORAL
  Filled 2013-03-23: qty 1

## 2013-03-23 MED ORDER — TRAMADOL HCL 50 MG PO TABS: 50.0000 mg | ORAL_TABLET | Freq: Four times a day (QID) | ORAL | Status: DC | PRN

## 2013-03-23 MED ORDER — SENNOSIDES-DOCUSATE SODIUM 8.6-50 MG PO TABS: 1.0000 | ORAL_TABLET | Freq: Every evening | ORAL | Status: DC | PRN

## 2013-03-23 MED ORDER — REGADENOSON 0.4 MG/5ML IV SOLN
0.4000 mg | Freq: Once | INTRAVENOUS | Status: AC
  Administered 2013-03-23: 0.4 mg via INTRAVENOUS

## 2013-03-23 MED ORDER — REGADENOSON 0.4 MG/5ML IV SOLN
INTRAVENOUS | Status: AC
  Filled 2013-03-23: qty 5

## 2013-03-23 MED ORDER — NITROGLYCERIN 0.4 MG SL SUBL: 0.4000 mg | SUBLINGUAL_TABLET | SUBLINGUAL | Status: DC | PRN

## 2013-03-23 NOTE — Progress Notes (Signed)
Patient ID: Deborah Coffey  female  QIO:[id number]    DOB: 1956/04/15    DOA: 03/22/2013  PCP: Jeanann Lewandowsky, MD  Assessment/Plan: Principal Problem:   Chest pain: Risk factors of CAD, hypertension hyperlipidemia, diabetes , However has musculo skeletal features - Rule out ACS, stress test results pending  - Tramadol for pain control  Active Problems:   Uncontrolled diabetes mellitus - Patient well known to me from the community in the center, she is not able to afford insulin outpatient, I had started her on metformin and farxiga    Dyslipidemia Continue statins    Essential hypertension, benign   HTN (hypertension)   CAD (coronary artery disease) - CABG x 1 (LIMA-LAD)    DVT Prophylaxis:  Code Status:  Disposition:Hopefully DC home today the stress test is negative. F/u in community [facility], appointment made    Subjective: Patient seen and examined in the morning, denied any chest pain   Objective: Weight change:  No intake or output data in the 24 hours ending 03/23/13 1639 Blood pressure 131/71, pulse 65, temperature 98.1 F (36.7 C), temperature source Oral, resp. rate 20, height 5\' 6"  (1.676 m), weight 104.327 kg (230 lb), SpO2 97.00%.  Physical Exam: General: Alert and awake, oriented x3, not in any acute distress. CVS: S1-S2 clear, no murmur rubs or gallops Chest: clear to auscultation bilaterally, no wheezing, rales or rhonchi +chest wall tenderness  Abdomen: soft nontender, nondistended, normal bowel sounds  Extremities: no cyanosis, clubbing or edema noted bilaterally Neuro: Cranial nerves II-XII intact, no focal neurological deficits  Lab Results: Basic Metabolic Panel:  Recent Labs Lab 03/22/13 1433  NA 139  K 4.4  CL 104  CO2 26  GLUCOSE 189*  BUN 15  CREATININE 0.75  CALCIUM 9.7   Liver Function Tests:  Recent Labs Lab 03/22/13 1433  AST 21  ALT 28  ALKPHOS 116  BILITOT 0.1*  PROT 7.4  ALBUMIN 3.6    Recent  Labs Lab 03/22/13 1840  LIPASE 33   No results found for this basename: AMMONIA,  in the last 168 hours CBC:  Recent Labs Lab 03/22/13 1433  WBC 6.8  HGB 12.7  HCT 38.8  MCV 86.2  PLT 304   Cardiac Enzymes:  Recent Labs Lab 03/22/13 1840 03/23/13 03/23/13 0505  TROPONINI <0.30 <0.30 <0.30   BNP: No components found with this basename: POCBNP,  CBG:  Recent Labs Lab 03/22/13 2207 03/23/13 0729 03/23/13 1316  GLUCAP 317* 191* 125*     Micro Results: No results found for this or any previous visit (from the past 240 hour(s)).  Studies/Results: Dg Chest 2 View  03/22/2013   CLINICAL DATA:  Chest pain  EXAM: CHEST  2 VIEW  COMPARISON:  11/21/2010  FINDINGS: The patient is status post median sternotomy. The cardiac silhouette is within normal limits. There is no evidence of focal infiltrates, effusions, nor edema. No focal reason consolidation. Multilevel spondylosis appreciated within the thoracic spine. The osseous structures otherwise unremarkable.  IMPRESSION: No active cardiopulmonary disease.   Electronically Signed   By: Salome Holmes M.D.   On: 03/22/2013 15:09    Medications: Scheduled Meds: . aspirin  325 mg Oral Daily  . enoxaparin (LOVENOX) injection  40 mg Subcutaneous Q24H  . gabapentin  100 mg Oral Daily  . hydrochlorothiazide  12.5 mg Oral Daily  . insulin aspart  0-9 Units Subcutaneous TID WC  . insulin glargine  10 Units Subcutaneous QHS  . lisinopril  10 mg  Oral Daily  . metoprolol  50 mg Oral BID  . pantoprazole  40 mg Oral Daily  . regadenoson      . senna-docusate  1 tablet Oral Daily  . simvastatin  20 mg Oral q1800  . sodium chloride  3 mL Intravenous Q12H      LOS: 1 day   Jay Haskew M.D. [facility] 03/23/2013, 4:39 PM Pager: [phone]  If 7PM-7AM, please contact night-coverage [email or web address] Password [email or web address]

## 2013-03-23 NOTE — Progress Notes (Signed)
Nuclear Stress test has been performed as a 1 day protocol with black & white images up for review.   Unfortunately, the report has not been published to Epic.   I have personally reviewed the images with radiology & agree with the findings of No ischemic / no Infarction.  Marykay Lex, MD

## 2013-03-23 NOTE — Progress Notes (Signed)
Inpatient Diabetes Program Recommendations  AACE/ADA: New Consensus Statement on Inpatient Glycemic Control (2013)  Target Ranges:  Prepandial:   less than 140 mg/dL      Peak postprandial:   less than 180 mg/dL (1-2 hours)      Critically ill patients:  140 - 180 mg/dL   Reason for Visit: Results for YATZIL, CLIPPINGER (MRN [id number]) as of 03/23/2013 16:10  Ref. Range 03/22/2013 22:07 03/23/2013 07:29 03/23/2013 13:16  Glucose-Capillary Latest Range: 70-99 mg/dL 782 (H) 956 (H) 213 (H)   Results for MADILINE, SAFFRAN (MRN [id number]) as of 03/23/2013 16:10  Ref. Range 03/20/2013 15:22  Hemoglobin A1C Latest Range: <5.7 % 10.5   Note that A1C elevated.  CBG's improved in the hospital.  May need insulin at discharge?  Agree with Lantus insulin.  Thanks, Beryl Meager, RN, BC-ADM Inpatient Diabetes Coordinator Pager [phone]

## 2013-03-23 NOTE — ED Provider Notes (Signed)
Medical screening examination/treatment/procedure(s) were conducted as a shared visit with non-physician practitioner(s) and myself.  I personally evaluated the patient during the encounter.   Please see my separate note.     Candyce Churn, MD 03/23/13 [phone]

## 2013-03-23 NOTE — Care Management Note (Addendum)
  Page 1 of 1   03/23/2013     10:38:33 AM   CARE MANAGEMENT NOTE 03/23/2013  Patient:  Deborah Coffey,Deborah Coffey   Account Number:  [id number]  Date Initiated:  03/23/2013  Documentation initiated by:  Donato Schultz  Subjective/Objective Assessment:   Admitted with chest pain     Action/Plan:   Appt established with [facility] [phone] for Wed 03/28/13 9:00 with Dr Isidoro Donning per MD request.   Erskine Emery Card: valid until 04/02/13 and instructed pt re-cert needed which can be completed over the phone.  CM will f/u.   Anticipated DC Date:  03/23/2013   Anticipated DC Plan:  HOME/SELF CARE      DC Planning Services  CM consult  Follow-up appt scheduled      PAC Choice  NA   Choice offered to / List presented to:  NA           Status of service:  Completed, signed off Medicare Important Message given?   (If response is "NO", the following Medicare IM given date fields will be blank) Date Medicare IM given:   Date Additional Medicare IM given:    Discharge Disposition:  HOME/SELF CARE  Per UR Regulation:  Reviewed for med. necessity/level of care/duration of stay  If discussed at Long Length of Stay Meetings, dates discussed:    Comments:

## 2013-03-23 NOTE — ED Provider Notes (Signed)
Medical screening examination/treatment/procedure(s) were conducted as a shared visit with non-physician practitioner(s) and myself.  I personally evaluated the patient during the encounter.  EKG Interpretation    Date/Time:  Thursday March 22 2013 14:12:53 EST Ventricular Rate:  82 PR Interval:  160 QRS Duration: 72 QT Interval:  382 QTC Calculation: 446 R Axis:   61 Text Interpretation:  Sinus rhythm with Premature atrial complexes Otherwise normal ECG No significant change was found Confirmed by [facility]  MD, TREY ([id number]) on 03/23/2013 7:26:01 AM            57 year old female with history of CABG presenting with chest pain. Some typical and some atypical symptoms. EKG unchanged, troponin negative. She has not had recent cardiology followup.  Given her elevated risk, feel that she warrants admission for ACS rule out.  Clinical Impression: 1. Chest pain   2. CAD (coronary artery disease)   3. Essential hypertension, benign   4. DM (diabetes mellitus)       Candyce Churn, MD 03/23/13 [phone]

## 2013-03-23 NOTE — Progress Notes (Signed)
DC orders received.  Patient stable with no S/S of distress.  Medication and discharge information reviewed with patient and patient's sister.  Patient DC home. [place], Mitzi Hansen

## 2013-03-23 NOTE — Consult Note (Signed)
Reason for Consult: Chest Pain Referring Physician:   Latosha Gaylord is an 57 y.o. female.  HPI:  The patient is a 57 yo obese female with a history of CAD, CABG x1 with LIMA to LAD 02/2004 by Dr. Alla German.  Her last cardiac cath was 2010 by Dr. Tresa Endo and revealed with improvement in the previously diagnosed 95% prox LAD stenosis to about 30%.  Circumflex was normal, 40-50% narrowing in the mid right coronary artery.  LIMA was patent. Normal aortoiliac system.  The patient has not been seen at [facility] in the last three years.  Her history also includes HTN, HLD, DMII, anemia.   The patient reports onset of CP(pressure/sharp), 7-8/10 on Wednesday.  It may have radiated to her neck but did radiate to her back.  Some SOB.  No nausea, vomiting or diaphoresis. The patient also denies fever, orthopnea, dizziness, PND, cough, abdominal pain, hematochezia, melena, lower extremity edema, claudication.  She ruled out for MI.  EKG shows SR with PACs.  No acute changes.     Past Medical History  Diagnosis Date  . CAD in native artery 02/2004    Mid LAD lesoin just after Large D1 --> CABG x 1 LIMA-LAD  . 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 competitive flow  . Hypertension   . High cholesterol   . Type II diabetes mellitus   . Anemia   . History of blood transfusion 2005    "related to OHS"   . Allergy     Past Surgical History  Procedure Laterality Date  . Cesarean section  1977; 1989  . Cholecystectomy  ~ 2012  . Tubal ligation  1989  . Coronary artery bypass graft  02/2004    "CABG X1" (03/22/2013); LIMA-LAD  . Cardiac catheterization  2010    Previous LAD 95% lesion - now ~30-40%; patent LIMA with competitive flow    Family History  Problem Relation Age of Onset  . Heart disease Mother     Social History:  reports that she has been smoking Cigarettes.  She has a 37 pack-year smoking history. She has never used smokeless  tobacco. She reports that she does not drink alcohol or use illicit drugs.  Allergies:  Allergies  Allergen Reactions  . Ciprofloxacin Rash    Medications:  Scheduled Meds: . aspirin  325 mg Oral Daily  . Dapagliflozin Propanediol  10 mg Oral Daily  . enoxaparin (LOVENOX) injection  40 mg Subcutaneous Q24H  . gabapentin  100 mg Oral Daily  . hydrochlorothiazide  12.5 mg Oral Daily  . insulin aspart  0-9 Units Subcutaneous TID WC  . insulin glargine  10 Units Subcutaneous QHS  . lisinopril  10 mg Oral Daily  . metoprolol  50 mg Oral BID  . pantoprazole  40 mg Oral Daily  . simvastatin  20 mg Oral q1800  . sodium chloride  3 mL Intravenous Q12H   Continuous Infusions:  PRN Meds:.acetaminophen, acetaminophen, methocarbamol, morphine injection, nitroGLYCERIN, ondansetron (ZOFRAN) IV, ondansetron, oxyCODONE   Results for orders placed during the hospital encounter of 03/22/13 (from the past 48 hour(s))  CBC     Status: None   Collection Time    03/22/13  2:33 PM      Result Value Range   WBC 6.8  4.0 - 10.5 K/uL   RBC 4.50  3.87 - 5.11 MIL/uL   Hemoglobin 12.7  12.0 - 15.0 g/dL  HCT 38.8  36.0 - 46.0 %   MCV 86.2  78.0 - 100.0 fL   MCH 28.2  26.0 - 34.0 pg   MCHC 32.7  30.0 - 36.0 g/dL   RDW 16.1  09.6 - 04.5 %   Platelets 304  150 - 400 K/uL  COMPREHENSIVE METABOLIC PANEL     Status: Abnormal   Collection Time    03/22/13  2:33 PM      Result Value Range   Sodium 139  135 - 145 mEq/L   Potassium 4.4  3.5 - 5.1 mEq/L   Chloride 104  96 - 112 mEq/L   CO2 26  19 - 32 mEq/L   Glucose, Bld 189 (*) 70 - 99 mg/dL   BUN 15  6 - 23 mg/dL   Creatinine, Ser 4.09  0.50 - 1.10 mg/dL   Calcium 9.7  8.4 - 81.1 mg/dL   Total Protein 7.4  6.0 - 8.3 g/dL   Albumin 3.6  3.5 - 5.2 g/dL   AST 21  0 - 37 U/L   ALT 28  0 - 35 U/L   Alkaline Phosphatase 116  39 - 117 U/L   Total Bilirubin 0.1 (*) 0.3 - 1.2 mg/dL   GFR calc non Af Amer >90  >90 mL/min   GFR calc Af Amer >90  >90  mL/min   Comment: (NOTE)     The eGFR has been calculated using the CKD EPI equation.     This calculation has not been validated in all clinical situations.     eGFR's persistently <90 mL/min signify possible Chronic Kidney     Disease.  POCT I-STAT TROPONIN I     Status: None   Collection Time    03/22/13  2:59 PM      Result Value Range   Troponin i, poc 0.00  0.00 - 0.08 ng/mL   Comment 3            Comment: Due to the release kinetics of cTnI,     a negative result within the first hours     of the onset of symptoms does not rule out     myocardial infarction with certainty.     If myocardial infarction is still suspected,     repeat the test at appropriate intervals.  TROPONIN I     Status: None   Collection Time    03/22/13  6:40 PM      Result Value Range   Troponin I <0.30  <0.30 ng/mL   Comment:            Due to the release kinetics of cTnI,     a negative result within the first hours     of the onset of symptoms does not rule out     myocardial infarction with certainty.     If myocardial infarction is still suspected,     repeat the test at appropriate intervals.  LIPASE, BLOOD     Status: None   Collection Time    03/22/13  6:40 PM      Result Value Range   Lipase 33  11 - 59 U/L  GLUCOSE, CAPILLARY     Status: Abnormal   Collection Time    03/22/13 10:07 PM      Result Value Range   Glucose-Capillary 317 (*) 70 - 99 mg/dL   Comment 1 Notify RN    TROPONIN I     Status: None  Collection Time    03/23/13 12:00 AM      Result Value Range   Troponin I <0.30  <0.30 ng/mL   Comment:            Due to the release kinetics of cTnI,     a negative result within the first hours     of the onset of symptoms does not rule out     myocardial infarction with certainty.     If myocardial infarction is still suspected,     repeat the test at appropriate intervals.  TROPONIN I     Status: None   Collection Time    03/23/13  5:05 AM      Result Value Range    Troponin I <0.30  <0.30 ng/mL   Comment:            Due to the release kinetics of cTnI,     a negative result within the first hours     of the onset of symptoms does not rule out     myocardial infarction with certainty.     If myocardial infarction is still suspected,     repeat the test at appropriate intervals.  GLUCOSE, CAPILLARY     Status: Abnormal   Collection Time    03/23/13  7:29 AM      Result Value Range   Glucose-Capillary 191 (*) 70 - 99 mg/dL    Dg Chest 2 View  19/04/4780   CLINICAL DATA:  Chest pain  EXAM: CHEST  2 VIEW  COMPARISON:  11/21/2010  FINDINGS: The patient is status post median sternotomy. The cardiac silhouette is within normal limits. There is no evidence of focal infiltrates, effusions, nor edema. No focal reason consolidation. Multilevel spondylosis appreciated within the thoracic spine. The osseous structures otherwise unremarkable.  IMPRESSION: No active cardiopulmonary disease.   Electronically Signed   By: Salome Holmes M.D.   On: 03/22/2013 15:09    Review of Systems  Constitutional: Negative for fever and diaphoresis.  HENT: Positive for congestion (mild). Negative for sore throat.   Respiratory: Positive for shortness of breath. Negative for cough and wheezing.   Cardiovascular: Positive for chest pain. Negative for orthopnea, claudication, leg swelling and PND.  Gastrointestinal: Positive for abdominal pain (upper abd). Negative for nausea, vomiting, blood in stool and melena.  Genitourinary: Negative for hematuria.  Musculoskeletal: Positive for back pain and neck pain (worse with movement).  Neurological: Negative for dizziness.  All other systems reviewed and are negative.   Blood pressure 116/49, pulse 75, temperature 98.1 F (36.7 C), temperature source Oral, resp. rate 18, height 5\' 6"  (1.676 m), weight 230 lb (104.327 kg), SpO2 97.00%. Physical Exam  Constitutional: She is oriented to person, place, and time. She appears  well-developed. No distress.  Obese  HENT:  Head: Normocephalic and atraumatic.  Mouth/Throat: Oropharynx is clear and moist. No oropharyngeal exudate.  Eyes: EOM are normal. Pupils are equal, round, and reactive to light. No scleral icterus.  Neck: Normal range of motion. Neck supple. No JVD present.  Cardiovascular: Normal rate, regular rhythm, S1 normal and S2 normal.   No murmur heard. Pulses:      Radial pulses are 2+ on the right side, and 2+ on the left side.       Dorsalis pedis pulses are 2+ on the right side, and 1+ on the left side.  No carotid bruit  Respiratory: Effort normal and breath sounds normal. She has no wheezes. She  has no rales.  GI: Soft. Bowel sounds are normal. She exhibits no distension. There is no tenderness.  Musculoskeletal: She exhibits no edema.  Lymphadenopathy:    She has no cervical adenopathy.  Neurological: She is alert and oriented to person, place, and time. She exhibits normal muscle tone.  Skin: Skin is warm and dry.  Psychiatric: She has a normal mood and affect.    Assessment/Plan: Principal Problem:   Chest pain Active Problems:   Uncontrolled diabetes mellitus   Dyslipidemia   Essential hypertension, benign   HTN (hypertension)   CAD (coronary artery disease) - CABG x 1 (LIMA-LAD)  Plan:  She ruled out for MI.  Her CP has typical and atypical features. No EKG changes to suggest ischemia. She has known CAD with CABGx1, DM, obesity and continues to smoke.  Lexiscan myoview today.     HAGER, BRYAN 03/23/2013, 8:29 AM   I have seen and evaluated the patient this AM along with Wilburt Finlay, PA. I agree with his findings, examination as well as impression recommendations.  Pleasant woman with h/o LAD-Diag disease Rx with LIMA-LAD in 2005.  Done well since.  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  Yesterday awoke with severe L sided cp - substernal & radiating to under breast & to the back.    Pain is  described as pressure with intermittent sharp pains (come & go).  Pressure is always present.  Has r/o MI.  ECG w/o abnormalities.   Has reproducible pain on palpation along the LLSB & along the inferior rib cage consistent with costochondritis - that is not unexpected with recent heavy lifting.   Given her CAD history & the Sx of Chest "pressure" is somewhat concerning -- especially as she continues to smoke. -- Agree with Lexiscan Myoview to exclude ischemic CAD; there is of-course, concern about potential breast attenuation artifact confusing potential LAD disease with artifact. -- continue ACE-I, BB & statin, ASA  We will happily provide support for the administering the stress test & assist with interpreting results.  If negative for ischemia, could be discharge with med Rx of MSK chest pain.    Would hope to have her rescheduled to see a Alcus Dad MD.  Marykay Lex, M.D., M.S. [facility] [address] [place], [place]  [address]  [phone] Pager # [phone] 03/23/2013 9:16 AM

## 2013-03-23 NOTE — Discharge Summary (Signed)
Physician Discharge Summary  Patient ID: Deborah Coffey MRN: [id number] DOB/AGE: 1955-05-19 57 y.o.  Admit date: 03/22/2013 Discharge date: 03/23/2013  Primary Care Physician:  Jeanann Lewandowsky, MD  Discharge Diagnoses:    . Chest pain- somewhat atypical, possibly costochondritis  . HTN (hypertension) . CAD (coronary artery disease) - CABG x 1 (LIMA-LAD) . Uncontrolled diabetes mellitus . Dyslipidemia . Essential hypertension, benign  Consults: Cardiology, Dr. Herbie Baltimore   Recommendations for Outpatient Follow-up:  Will have her followup with [facility] MD   Allergies:   Allergies  Allergen Reactions  . Ciprofloxacin Rash     Discharge Medications:   Medication List         aspirin 81 MG tablet  Take 1 tablet (81 mg total) by mouth daily.     Dapagliflozin Propanediol 10 MG Tabs  Commonly known as:  FARXIGA  Take 10 mg by mouth daily.     gabapentin 100 MG capsule  Commonly known as:  NEURONTIN  Take 1 capsule (100 mg total) by mouth daily.     hydrochlorothiazide 12.5 MG capsule  Commonly known as:  MICROZIDE  Take 1 capsule (12.5 mg total) by mouth daily.     lisinopril 10 MG tablet  Commonly known as:  PRINIVIL,ZESTRIL  Take 1 tablet (10 mg total) by mouth daily.     metFORMIN 500 MG 24 hr tablet  Commonly known as:  GLUCOPHAGE XR  Take 2 tablets (1,000 mg total) by mouth 2 (two) times daily with a meal.     methocarbamol 750 MG tablet  Commonly known as:  ROBAXIN-750  Take 2 tablets (1,500 mg total) by mouth 3 (three) times daily.     metoprolol 50 MG tablet  Commonly known as:  LOPRESSOR  Take 1 tablet (50 mg total) by mouth 2 (two) times daily.     naproxen 500 MG tablet  Commonly known as:  NAPROSYN  Take 1 tablet (500 mg total) by mouth 2 (two) times daily with a meal.     nitroGLYCERIN 0.4 MG SL tablet  Commonly known as:  NITROSTAT  Place 1 tablet (0.4 mg total) under the tongue every 5 (five) minutes as needed for chest pain.      pantoprazole 40 MG tablet  Commonly known as:  PROTONIX  Take 1 tablet (40 mg total) by mouth daily.     pravastatin 40 MG tablet  Commonly known as:  PRAVACHOL  Take 1 tablet (40 mg total) by mouth daily.     senna-docusate 8.6-50 MG per tablet  Commonly known as:  Senokot-S  Take 1 tablet by mouth at bedtime as needed for mild constipation.     traMADol 50 MG tablet  Commonly known as:  ULTRAM  Take 1 tablet (50 mg total) by mouth every 6 (six) hours as needed.         Brief H and P: For complete details please refer to admission H and P, but in brief Deborah Coffey is a 57 y.o. female with PMH of HTN, DM, HPL, CAD s/p CABG presented with substernal intermittent non-exertional chest pain, associated with some diaphoresis and dizziness; denies SOB, no fever, no cough, no nausea, vomiting opr diarrhea; but she also reports some epigastric pain    Hospital Course:    57 y.o. female with PMH of HTN, DM, HPL, CAD s/p CABG presented with substernal intermittent non-exertional chest pain, associated with some diaphoresis and dizziness;  Chest pain atypical, but significant risk factors; patient was admitted to telemetry,  serial troponins were obtained which were negative. EKG showed no acute ST-T wave changes just above any ischemia. Cardiology was consulted and patient underwent stress tes which was negative for any reversible ischemia t . She also has some component of musculoskeletal features and costochondritis with heavy lifting. Patient was given tramadol as needed for pain  2. CAD s/p CABG; cont ASA, BB, statin   3. DM uncontrolled; HA1C-10.5  - Patient was recently started on metformin and farxigo, dose increased this week. She has not been able to afford insulin previously.  4. HTN cont home regimen       Day of Discharge BP 131/71  Pulse 65  Temp(Src) 98.1 F (36.7 C) (Oral)  Resp 20  Ht 5\' 6"  (1.676 m)  Wt 104.327 kg (230 lb)  BMI 37.14 kg/m2  SpO2  97%  Physical Exam: General: Alert and awake oriented x3 not in any acute distress. CVS: S1-S2 clear no murmur rubs or gallops Chest: clear to auscultation bilaterally, no wheezing rales or rhonchi + Chest wall tenderness  Abdomen: soft nontender, nondistended, normal bowel sounds, no organomegaly Extremities: no cyanosis, clubbing or edema noted bilaterally Neuro: Cranial nerves II-XII intact, no focal neurological deficits   The results of significant diagnostics from this hospitalization (including imaging, microbiology, ancillary and laboratory) are listed below for reference.    LAB RESULTS: Basic Metabolic Panel:  Recent Labs Lab 03/22/13 1433  NA 139  K 4.4  CL 104  CO2 26  GLUCOSE 189*  BUN 15  CREATININE 0.75  CALCIUM 9.7   Liver Function Tests:  Recent Labs Lab 03/22/13 1433  AST 21  ALT 28  ALKPHOS 116  BILITOT 0.1*  PROT 7.4  ALBUMIN 3.6    Recent Labs Lab 03/22/13 1840  LIPASE 33   No results found for this basename: AMMONIA,  in the last 168 hours CBC:  Recent Labs Lab 03/22/13 1433  WBC 6.8  HGB 12.7  HCT 38.8  MCV 86.2  PLT 304   Cardiac Enzymes:  Recent Labs Lab 03/23/13 03/23/13 0505  TROPONINI <0.30 <0.30   BNP: No components found with this basename: POCBNP,  CBG:  Recent Labs Lab 03/23/13 1316 03/23/13 1651  GLUCAP 125* 349*    Significant Diagnostic Studies:  Dg Chest 2 View  03/22/2013   CLINICAL DATA:  Chest pain  EXAM: CHEST  2 VIEW  COMPARISON:  11/21/2010  FINDINGS: The patient is status post median sternotomy. The cardiac silhouette is within normal limits. There is no evidence of focal infiltrates, effusions, nor edema. No focal reason consolidation. Multilevel spondylosis appreciated within the thoracic spine. The osseous structures otherwise unremarkable.  IMPRESSION: No active cardiopulmonary disease.   Electronically Signed   By: Salome Holmes M.D.   On: 03/22/2013 15:09    2D ECHO:   Disposition and  Follow-up: Discharge Orders   Future Appointments Provider Department Dept Phone   03/28/2013 9:00 AM [facility] [phone]   06/18/2013 9:00 AM [facility] [phone]   06/22/2013 3:00 PM Doris Cheadle, MD [facility] [phone]   Future Orders Complete By Expires   Diet Carb Modified  As directed    Increase activity slowly  As directed        DISPOSITION: Home  DIET: Carb modified diet    DISCHARGE FOLLOW-UP Follow-up Information   Follow up with [facility] On 03/28/2013. (03/28/2013 WED  9:00am with MD:RAI)    Contact information:   [address] [place] [address] [phone]      Time spent on Discharge: 35 mins  Signed:   RAI,RIPUDEEP M.D. [facility] 03/23/2013, 6:55 PM Pager: [phone]

## 2013-03-24 ENCOUNTER — Encounter: Payer: Self-pay | Admitting: Internal Medicine

## 2013-03-28 ENCOUNTER — Ambulatory Visit: Payer: No Typology Code available for payment source | Attending: Internal Medicine | Admitting: Internal Medicine

## 2013-03-28 VITALS — BP 124/78 | HR 77 | Temp 98.1°F | Resp 16 | Wt 229.0 lb

## 2013-03-28 DIAGNOSIS — I10 Essential (primary) hypertension: Secondary | ICD-10-CM

## 2013-03-28 DIAGNOSIS — E785 Hyperlipidemia, unspecified: Secondary | ICD-10-CM

## 2013-03-28 DIAGNOSIS — E119 Type 2 diabetes mellitus without complications: Secondary | ICD-10-CM

## 2013-03-28 NOTE — Progress Notes (Signed)
Patient ID: Deborah Coffey, female   DOB: [birth year], 56 y.o.   MRN: [id number] Patient Demographics  Deborah Coffey, is a 57 y.o. female  WUJ:[id number]  [id number]  DOB - [birth year]  Chief Complaint  Patient presents with  . Hospitalization Follow-up        Subjective:   Deborah Coffey is a 58 y.o. female here today for a follow up visit. Patient was seen in the ER recently for chest pain, she was subsequently admitted to the hospital, MI was ruled out with cardiac enzymes and a negative stress test, she was told to followup with our clinic after discharge. She has no new complaints, chest pain has resolved, she claims compliant with medications. She continue to smoke cigarette, denies the use of alcohol. Patient has No headache, No chest pain, No abdominal pain - No Nausea, No new weakness tingling or numbness, No Cough - SOB.  ALLERGIES: Allergies  Allergen Reactions  . Ciprofloxacin Rash    PAST MEDICAL HISTORY: Past Medical History  Diagnosis Date  . CAD in native artery 02/2004    Mid LAD lesoin just after Large D1 --> CABG x 1 LIMA-LAD  . 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 competitive flow  . Hypertension   . High cholesterol   . Type II diabetes mellitus   . Anemia   . History of blood transfusion 2005    "related to [facility]"   . Allergy     MEDICATIONS AT HOME: Prior to Admission medications   Medication Sig Start Date End Date Taking? Authorizing Provider  aspirin 81 MG tablet Take 1 tablet (81 mg total) by mouth daily. 02/14/13   Ripudeep Jenna Luo, MD  Dapagliflozin Propanediol (FARXIGA) 10 MG TABS Take 10 mg by mouth daily. 03/20/13   Doris Cheadle, MD  gabapentin (NEURONTIN) 100 MG capsule Take 1 capsule (100 mg total) by mouth daily. 02/14/13   Ripudeep Jenna Luo, MD  hydrochlorothiazide (MICROZIDE) 12.5 MG capsule Take 1 capsule (12.5 mg total) by mouth daily. 02/14/13   Ripudeep Jenna Luo, MD   lisinopril (PRINIVIL,ZESTRIL) 10 MG tablet Take 1 tablet (10 mg total) by mouth daily. 02/14/13   Ripudeep Jenna Luo, MD  metFORMIN (GLUCOPHAGE XR) 500 MG 24 hr tablet Take 2 tablets (1,000 mg total) by mouth 2 (two) times daily with a meal. 02/14/13   Ripudeep Jenna Luo, MD  methocarbamol (ROBAXIN-750) 750 MG tablet Take 2 tablets (1,500 mg total) by mouth 3 (three) times daily. 03/19/13   Olivia Mackie, MD  metoprolol (LOPRESSOR) 50 MG tablet Take 1 tablet (50 mg total) by mouth 2 (two) times daily. 02/14/13   Ripudeep Jenna Luo, MD  naproxen (NAPROSYN) 500 MG tablet Take 1 tablet (500 mg total) by mouth 2 (two) times daily with a meal. 03/19/13   Olivia Mackie, MD  nitroGLYCERIN (NITROSTAT) 0.4 MG SL tablet Place 1 tablet (0.4 mg total) under the tongue every 5 (five) minutes as needed for chest pain. 03/23/13   Ripudeep Jenna Luo, MD  pantoprazole (PROTONIX) 40 MG tablet Take 1 tablet (40 mg total) by mouth daily. 03/23/13   Ripudeep Jenna Luo, MD  pravastatin (PRAVACHOL) 40 MG tablet Take 1 tablet (40 mg total) by mouth daily. 02/14/13   Ripudeep Jenna Luo, MD  senna-docusate (SENOKOT-S) 8.6-50 MG per tablet Take 1 tablet by mouth at bedtime as needed for mild constipation. 03/23/13   Ripudeep Jenna Luo, MD  traMADol Janean Sark)  50 MG tablet Take 1 tablet (50 mg total) by mouth every 6 (six) hours as needed. 03/23/13   Ripudeep Jenna Luo, MD     Objective:   Filed Vitals:   03/28/13 1036  BP: 124/78  Pulse: 77  Temp: 98.1 F (36.7 C)  Resp: 16  Weight: 229 lb (103.874 kg)  SpO2: 100%    Exam General appearance : Awake, alert, not in any distress. Speech Clear. Not toxic looking HEENT: Atraumatic and Normocephalic, pupils equally reactive to light and accomodation Neck: supple, no JVD. No cervical lymphadenopathy.  Chest:Good air entry bilaterally, no added sounds  CVS: S1 S2 regular, no murmurs.  Abdomen: Bowel sounds present, Non tender and not distended with no gaurding, rigidity or rebound. Extremities: B/L Lower  Ext shows no edema, both legs are warm to touch Neurology: Awake alert, and oriented X 3, CN II-XII intact, Non focal Skin:No Rash Wounds:N/A   Data Review   CBC  Recent Labs Lab 03/22/13 1433  WBC 6.8  HGB 12.7  HCT 38.8  PLT 304  MCV 86.2  MCH 28.2  MCHC 32.7  RDW 14.0    Chemistries    Recent Labs Lab 03/22/13 1433  NA 139  K 4.4  CL 104  CO2 26  GLUCOSE 189*  BUN 15  CREATININE 0.75  CALCIUM 9.7  AST 21  ALT 28  ALKPHOS 116  BILITOT 0.1*   ------------------------------------------------------------------------------------------------------------------ No results found for this basename: HGBA1C,  in the last 72 hours ------------------------------------------------------------------------------------------------------------------ No results found for this basename: CHOL, HDL, LDLCALC, TRIG, CHOLHDL, LDLDIRECT,  in the last 72 hours ------------------------------------------------------------------------------------------------------------------ No results found for this basename: TSH, T4TOTAL, FREET3, T3FREE, THYROIDAB,  in the last 72 hours ------------------------------------------------------------------------------------------------------------------ No results found for this basename: VITAMINB12, FOLATE, FERRITIN, TIBC, IRON, RETICCTPCT,  in the last 72 hours  Coagulation profile  No results found for this basename: INR, PROTIME,  in the last 168 hours    Assessment & Plan   1. DM (diabetes mellitus) Continue present regimen of medications  2. Essential hypertension, benign Continue present medications  3. Dyslipidemia Continue pravastatin 40 mg tablet by mouth daily  Patient extensively counseled on smoking cessation Patient was counseled on nutrition and exercise  Follow up in 3 months or when necessary   The patient was given clear instructions to go to ER or return to medical center if symptoms don't improve, worsen or new  problems develop. The patient verbalized understanding. The patient was told to call to get lab results if they haven't heard anything in the next week.    Jeanann Lewandowsky, MD, MHA, FACP, FAAP [facility] [place], [place] [phone]   03/28/2013, 10:47 AM

## 2013-03-28 NOTE — Progress Notes (Signed)
Patient here for follow up from hospital Was seen in the ED for chest pains Stated feeling much better

## 2013-04-03 DIAGNOSIS — E11319 Type 2 diabetes mellitus with unspecified diabetic retinopathy without macular edema: Secondary | ICD-10-CM | POA: Insufficient documentation

## 2013-04-04 ENCOUNTER — Ambulatory Visit: Payer: Self-pay | Admitting: Cardiology

## 2013-04-05 ENCOUNTER — Ambulatory Visit: Payer: Self-pay

## 2013-04-10 ENCOUNTER — Ambulatory Visit: Payer: Self-pay | Admitting: Cardiology

## 2013-05-29 ENCOUNTER — Telehealth: Payer: Self-pay | Admitting: Internal Medicine

## 2013-05-29 NOTE — Telephone Encounter (Signed)
Pt was at [facility] and was told she needs to start taking Prednisone but because she is diabetic this might pose a problem.  Please f/u with pt.

## 2013-06-07 ENCOUNTER — Ambulatory Visit: Payer: Self-pay | Admitting: Internal Medicine

## 2013-06-12 ENCOUNTER — Ambulatory Visit: Payer: Self-pay | Admitting: Internal Medicine

## 2013-06-13 ENCOUNTER — Ambulatory Visit: Payer: Self-pay

## 2013-06-18 ENCOUNTER — Ambulatory Visit: Payer: Self-pay

## 2013-06-18 ENCOUNTER — Other Ambulatory Visit: Payer: Self-pay

## 2013-06-20 ENCOUNTER — Ambulatory Visit: Payer: No Typology Code available for payment source | Attending: Internal Medicine | Admitting: Cardiology

## 2013-06-20 ENCOUNTER — Encounter: Payer: Self-pay | Admitting: Cardiology

## 2013-06-20 VITALS — BP 156/88 | HR 109 | Temp 98.5°F | Resp 18 | Ht 66.0 in | Wt 233.0 lb

## 2013-06-20 DIAGNOSIS — E1310 Other specified diabetes mellitus with ketoacidosis without coma: Secondary | ICD-10-CM

## 2013-06-20 DIAGNOSIS — E785 Hyperlipidemia, unspecified: Secondary | ICD-10-CM

## 2013-06-20 DIAGNOSIS — E1165 Type 2 diabetes mellitus with hyperglycemia: Secondary | ICD-10-CM

## 2013-06-20 DIAGNOSIS — E119 Type 2 diabetes mellitus without complications: Secondary | ICD-10-CM | POA: Insufficient documentation

## 2013-06-20 DIAGNOSIS — Z951 Presence of aortocoronary bypass graft: Secondary | ICD-10-CM | POA: Insufficient documentation

## 2013-06-20 DIAGNOSIS — I10 Essential (primary) hypertension: Secondary | ICD-10-CM | POA: Insufficient documentation

## 2013-06-20 DIAGNOSIS — E7800 Pure hypercholesterolemia, unspecified: Secondary | ICD-10-CM | POA: Insufficient documentation

## 2013-06-20 DIAGNOSIS — D649 Anemia, unspecified: Secondary | ICD-10-CM | POA: Insufficient documentation

## 2013-06-20 DIAGNOSIS — I2510 Atherosclerotic heart disease of native coronary artery without angina pectoris: Secondary | ICD-10-CM

## 2013-06-20 DIAGNOSIS — E1110 Type 2 diabetes mellitus with ketoacidosis without coma: Secondary | ICD-10-CM

## 2013-06-20 DIAGNOSIS — Z9119 Patient's noncompliance with other medical treatment and regimen: Secondary | ICD-10-CM | POA: Insufficient documentation

## 2013-06-20 DIAGNOSIS — Z91199 Patient's noncompliance with other medical treatment and regimen due to unspecified reason: Secondary | ICD-10-CM | POA: Insufficient documentation

## 2013-06-20 DIAGNOSIS — Z720 Tobacco use: Secondary | ICD-10-CM | POA: Insufficient documentation

## 2013-06-20 DIAGNOSIS — Z7982 Long term (current) use of aspirin: Secondary | ICD-10-CM | POA: Insufficient documentation

## 2013-06-20 DIAGNOSIS — IMO0002 Reserved for concepts with insufficient information to code with codable children: Secondary | ICD-10-CM

## 2013-06-20 DIAGNOSIS — IMO0001 Reserved for inherently not codable concepts without codable children: Secondary | ICD-10-CM

## 2013-06-20 DIAGNOSIS — F17200 Nicotine dependence, unspecified, uncomplicated: Secondary | ICD-10-CM

## 2013-06-20 LAB — POCT GLYCOSYLATED HEMOGLOBIN (HGB A1C): Hemoglobin A1C: 10.5

## 2013-06-20 NOTE — Progress Notes (Signed)
Pt here to f/u with Dr. 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

## 2013-06-20 NOTE — Progress Notes (Signed)
HPI Deborah Coffey comes today to establish with me as her cardiologist. She has a history of coronary bypass grafting in 2005 with a left internal mammary artery to the LAD. Repeat catheterization in 2010 showed a patent LIMA and competitive flow in the LAD.  She is extremely noncompliant. Her hemoglobin A1c was 10.5 on last check and is 10.5 today. She says she weighs the same and she did 10 years ago. She seems to be well-versed in diet when I question her. She also continues to smoke. Her lipids were at goal that remarkably good HDL in April 2014. Her blood pressures high today at heart rates in the 100. She says she forgets to take her medicines on a daily basis. She clearly has not taken her metoprolol a day.  She had extensive workup for her chest pain in December of 2014. Cardiac enzymes were negative. EKG was normal. Stress nuclear study was normal.  Past Medical History  Diagnosis Date  . CAD in native artery 02/2004    Mid LAD lesoin just after Large D1 --> CABG x 1 LIMA-LAD  . 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 competitive flow  . Hypertension   . High cholesterol   . Type II diabetes mellitus   . Anemia   . History of blood transfusion 2005    "related to [facility]"   . Allergy     Current Outpatient Prescriptions  Medication Sig Dispense Refill  . aspirin 81 MG tablet Take 1 tablet (81 mg total) by mouth daily.  30 tablet  5  . Dapagliflozin Propanediol (FARXIGA) 10 MG TABS Take 10 mg by mouth daily.  30 tablet  3  . gabapentin (NEURONTIN) 100 MG capsule Take 1 capsule (100 mg total) by mouth daily.  30 capsule  3  . hydrochlorothiazide (MICROZIDE) 12.5 MG capsule Take 1 capsule (12.5 mg total) by mouth daily.  30 capsule  5  . lisinopril (PRINIVIL,ZESTRIL) 10 MG tablet Take 1 tablet (10 mg total) by mouth daily.  30 tablet  5  . metFORMIN (GLUCOPHAGE XR) 500 MG 24 hr tablet Take 2 tablets (1,000 mg total) by mouth 2 (two) times  daily with a meal.  120 tablet  5  . methocarbamol (ROBAXIN-750) 750 MG tablet Take 2 tablets (1,500 mg total) by mouth 3 (three) times daily.  30 tablet  0  . metoprolol (LOPRESSOR) 50 MG tablet Take 1 tablet (50 mg total) by mouth 2 (two) times daily.  60 tablet  4  . pantoprazole (PROTONIX) 40 MG tablet Take 1 tablet (40 mg total) by mouth daily.  30 tablet  3  . pravastatin (PRAVACHOL) 40 MG tablet Take 1 tablet (40 mg total) by mouth daily.  30 tablet  5  . traMADol (ULTRAM) 50 MG tablet Take 1 tablet (50 mg total) by mouth every 6 (six) hours as needed.  30 tablet  0  . naproxen (NAPROSYN) 500 MG tablet Take 1 tablet (500 mg total) by mouth 2 (two) times daily with a meal.  30 tablet  0  . nitroGLYCERIN (NITROSTAT) 0.4 MG SL tablet Place 1 tablet (0.4 mg total) under the tongue every 5 (five) minutes as needed for chest pain.  30 tablet  3  . senna-docusate (SENOKOT-S) 8.6-50 MG per tablet Take 1 tablet by mouth at bedtime as needed for mild constipation.  30 tablet  1   No current facility-administered medications for this visit.  Allergies  Allergen Reactions  . Ciprofloxacin Rash    Family History  Problem Relation Age of Onset  . Heart disease Mother     History   Social History  . Marital Status: Single    Spouse Name: N/A    Number of Children: N/A  . Years of Education: N/A   Occupational History  . Not on file.   Social History Main Topics  . Smoking status: Current Every Day Smoker -- 1.00 packs/day for 37 years    Types: Cigarettes  . Smokeless tobacco: Never Used  . Alcohol Use: No  . Drug Use: No  . Sexual Activity: Not Currently   Other Topics Concern  . Not on file   Social History Narrative  . No narrative on file    ROS ALL NEGATIVE EXCEPT THOSE NOTED IN HPI  PE  General Appearance: well developed, well nourished in no acute distress, obese HEENT: symmetrical face, PERRLA, good dentition  Neck: no JVD, thyromegaly, or adenopathy, trachea  midline Chest: symmetric without deformity, sternotomy incision and scar Cardiac: PMI non-displaced, RRR, normal S1, S2, no gallop or murmur Lung: clear to ausculation and percussion Vascular: all pulses full without bruits  Abdominal: nondistended, nontender, good bowel sounds, no HSM, no bruits Extremities: no cyanosis, clubbing or edema, no sign of DVT, no varicosities  Skin: normal color, no rashes Neuro: alert and oriented x 3, non-focal Pysch: normal affect  EKG  BMET    Component Value Date/Time   NA 139 03/22/2013 1433   K 4.4 03/22/2013 1433   CL 104 03/22/2013 1433   CO2 26 03/22/2013 1433   GLUCOSE 189* 03/22/2013 1433   BUN 15 03/22/2013 1433   CREATININE 0.75 03/22/2013 1433   CREATININE 0.89 12/29/2012 1016   CALCIUM 9.7 03/22/2013 1433   GFRNONAA >90 03/22/2013 1433   GFRAA >90 03/22/2013 1433    Lipid Panel     Component Value Date/Time   CHOL 157 12/29/2012 1016   TRIG 149 12/29/2012 1016   HDL 59 12/29/2012 1016   CHOLHDL 2.7 12/29/2012 1016   VLDL 30 12/29/2012 1016   LDLCALC 68 12/29/2012 1016    CBC    Component Value Date/Time   WBC 6.8 03/22/2013 1433   RBC 4.50 03/22/2013 1433   HGB 12.7 03/22/2013 1433   HCT 38.8 03/22/2013 1433   PLT 304 03/22/2013 1433   MCV 86.2 03/22/2013 1433   MCH 28.2 03/22/2013 1433   MCHC 32.7 03/22/2013 1433   RDW 14.0 03/22/2013 1433   LYMPHSABS 2.3 11/21/2010 2027   MONOABS 0.5 11/21/2010 2027   EOSABS 0.1 11/21/2010 2027   BASOSABS 0.0 11/21/2010 2027

## 2013-06-20 NOTE — Assessment & Plan Note (Signed)
Stable. Continue secondary preventative therapy. As the long time talking to her about better blood sugar control, walking or exercise, taking all her meds with good control her blood pressure and lipids. I've also advised to stop smoking. I will see her back on an annual basis. Followup primary care.

## 2013-06-22 ENCOUNTER — Ambulatory Visit: Payer: Self-pay | Admitting: Internal Medicine

## 2013-07-26 ENCOUNTER — Ambulatory Visit: Payer: Self-pay

## 2013-10-17 ENCOUNTER — Other Ambulatory Visit: Payer: Self-pay | Admitting: Internal Medicine

## 2013-11-19 ENCOUNTER — Other Ambulatory Visit: Payer: Self-pay | Admitting: Internal Medicine

## 2013-11-28 ENCOUNTER — Telehealth: Payer: Self-pay | Admitting: Internal Medicine

## 2013-11-28 NOTE — Telephone Encounter (Signed)
Pt. Came in asking to speak to Dr. Verl Blalock, pt. Needs a verbal authorization to be given to [employer] stating she can work....the person of contact Mariana Single [phone].Marland KitchenMarland KitchenMarland KitchenMarland Kitchenplease call patient as well.

## 2013-11-30 NOTE — Telephone Encounter (Signed)
Chart reviewed; patient last seen by Dr. Verl Blalock in 3/15. Will route to Dr. Verl Blalock for advice.

## 2013-12-05 NOTE — Telephone Encounter (Signed)
She can return to work with no limitations.

## 2013-12-07 ENCOUNTER — Telehealth: Payer: Self-pay | Admitting: Internal Medicine

## 2013-12-07 ENCOUNTER — Telehealth: Payer: Self-pay | Admitting: Emergency Medicine

## 2013-12-07 NOTE — Telephone Encounter (Signed)
Pt informed she will need to bring paperwork for Dr. Verl Blalock to complete in regards to work limitations. Pt will be here next Wednesday

## 2013-12-12 ENCOUNTER — Telehealth: Payer: Self-pay | Admitting: Cardiology

## 2013-12-12 NOTE — Telephone Encounter (Signed)
Pt dropped off medical clearance request form to be filled out by Dr. Verl Blalock due to a job related injury.

## 2013-12-28 NOTE — Telephone Encounter (Signed)
Pt dropped off paperwork for medical clearance on 12/12/13. Pt calling to check status of completion of paperwork. Pt informed that you are in office on Wednesday's and that it could take up to 14 days from the date of drop off, for paperwork to be completed. Please contact patient.

## 2014-01-01 ENCOUNTER — Observation Stay (HOSPITAL_COMMUNITY): Payer: Self-pay

## 2014-01-01 ENCOUNTER — Encounter (HOSPITAL_COMMUNITY): Payer: Self-pay | Admitting: Emergency Medicine

## 2014-01-01 ENCOUNTER — Inpatient Hospital Stay (HOSPITAL_COMMUNITY)
Admission: EM | Admit: 2014-01-01 | Discharge: 2014-01-03 | DRG: 880 | Disposition: A | Discharge status: Home / Self Care | Payer: Self-pay | Attending: Internal Medicine | Admitting: Internal Medicine

## 2014-01-01 ENCOUNTER — Emergency Department (HOSPITAL_COMMUNITY): Payer: Self-pay

## 2014-01-01 DIAGNOSIS — E1110 Type 2 diabetes mellitus with ketoacidosis without coma: Secondary | ICD-10-CM

## 2014-01-01 DIAGNOSIS — F449 Dissociative and conversion disorder, unspecified: Principal | ICD-10-CM | POA: Diagnosis present

## 2014-01-01 DIAGNOSIS — Z951 Presence of aortocoronary bypass graft: Secondary | ICD-10-CM

## 2014-01-01 DIAGNOSIS — Z7982 Long term (current) use of aspirin: Secondary | ICD-10-CM

## 2014-01-01 DIAGNOSIS — I6789 Other cerebrovascular disease: Secondary | ICD-10-CM

## 2014-01-01 DIAGNOSIS — F329 Major depressive disorder, single episode, unspecified: Secondary | ICD-10-CM | POA: Diagnosis present

## 2014-01-01 DIAGNOSIS — Z6836 Body mass index (BMI) 36.0-36.9, adult: Secondary | ICD-10-CM

## 2014-01-01 DIAGNOSIS — R29810 Facial weakness: Secondary | ICD-10-CM | POA: Diagnosis present

## 2014-01-01 DIAGNOSIS — I639 Cerebral infarction, unspecified: Secondary | ICD-10-CM

## 2014-01-01 DIAGNOSIS — G459 Transient cerebral ischemic attack, unspecified: Secondary | ICD-10-CM

## 2014-01-01 DIAGNOSIS — IMO0001 Reserved for inherently not codable concepts without codable children: Secondary | ICD-10-CM | POA: Diagnosis present

## 2014-01-01 DIAGNOSIS — F17200 Nicotine dependence, unspecified, uncomplicated: Secondary | ICD-10-CM | POA: Diagnosis present

## 2014-01-01 DIAGNOSIS — H539 Unspecified visual disturbance: Secondary | ICD-10-CM | POA: Diagnosis present

## 2014-01-01 DIAGNOSIS — E785 Hyperlipidemia, unspecified: Secondary | ICD-10-CM | POA: Diagnosis present

## 2014-01-01 DIAGNOSIS — F4329 Adjustment disorder with other symptoms: Secondary | ICD-10-CM

## 2014-01-01 DIAGNOSIS — Z794 Long term (current) use of insulin: Secondary | ICD-10-CM

## 2014-01-01 DIAGNOSIS — Z79899 Other long term (current) drug therapy: Secondary | ICD-10-CM

## 2014-01-01 DIAGNOSIS — I2510 Atherosclerotic heart disease of native coronary artery without angina pectoris: Secondary | ICD-10-CM | POA: Diagnosis present

## 2014-01-01 DIAGNOSIS — J069 Acute upper respiratory infection, unspecified: Secondary | ICD-10-CM | POA: Diagnosis present

## 2014-01-01 DIAGNOSIS — F3289 Other specified depressive episodes: Secondary | ICD-10-CM | POA: Diagnosis present

## 2014-01-01 DIAGNOSIS — R4789 Other speech disturbances: Secondary | ICD-10-CM | POA: Diagnosis present

## 2014-01-01 DIAGNOSIS — E1165 Type 2 diabetes mellitus with hyperglycemia: Secondary | ICD-10-CM

## 2014-01-01 DIAGNOSIS — I10 Essential (primary) hypertension: Secondary | ICD-10-CM | POA: Diagnosis present

## 2014-01-01 DIAGNOSIS — J209 Acute bronchitis, unspecified: Secondary | ICD-10-CM | POA: Diagnosis present

## 2014-01-01 DIAGNOSIS — E669 Obesity, unspecified: Secondary | ICD-10-CM | POA: Diagnosis present

## 2014-01-01 DIAGNOSIS — Z720 Tobacco use: Secondary | ICD-10-CM | POA: Diagnosis present

## 2014-01-01 DIAGNOSIS — R471 Dysarthria and anarthria: Secondary | ICD-10-CM | POA: Diagnosis present

## 2014-01-01 DIAGNOSIS — E11319 Type 2 diabetes mellitus with unspecified diabetic retinopathy without macular edema: Secondary | ICD-10-CM | POA: Diagnosis present

## 2014-01-01 LAB — I-STAT CHEM 8, ED
BUN: 12 mg/dL (ref 6–23)
CREATININE: 0.7 mg/dL (ref 0.50–1.10)
Calcium, Ion: 1.2 mmol/L (ref 1.12–1.23)
Chloride: 103 mEq/L (ref 96–112)
Glucose, Bld: 334 mg/dL — ABNORMAL HIGH (ref 70–99)
HCT: 41 % (ref 36.0–46.0)
Hemoglobin: 13.9 g/dL (ref 12.0–15.0)
Potassium: 4.3 mEq/L (ref 3.7–5.3)
SODIUM: 139 meq/L (ref 137–147)
TCO2: 24 mmol/L (ref 0–100)

## 2014-01-01 LAB — CBC
HEMATOCRIT: 37.3 % (ref 36.0–46.0)
Hemoglobin: 12.2 g/dL (ref 12.0–15.0)
MCH: 26.9 pg (ref 26.0–34.0)
MCHC: 32.7 g/dL (ref 30.0–36.0)
MCV: 82.3 fL (ref 78.0–100.0)
PLATELETS: 295 10*3/uL (ref 150–400)
RBC: 4.53 MIL/uL (ref 3.87–5.11)
RDW: 14.3 % (ref 11.5–15.5)
WBC: 5.8 10*3/uL (ref 4.0–10.5)

## 2014-01-01 LAB — DIFFERENTIAL
Basophils Absolute: 0 10*3/uL (ref 0.0–0.1)
Basophils Relative: 0 % (ref 0–1)
Eosinophils Absolute: 0.2 10*3/uL (ref 0.0–0.7)
Eosinophils Relative: 3 % (ref 0–5)
LYMPHS PCT: 40 % (ref 12–46)
Lymphs Abs: 2.4 10*3/uL (ref 0.7–4.0)
MONO ABS: 0.3 10*3/uL (ref 0.1–1.0)
Monocytes Relative: 6 % (ref 3–12)
NEUTROS ABS: 3 10*3/uL (ref 1.7–7.7)
Neutrophils Relative %: 51 % (ref 43–77)

## 2014-01-01 LAB — COMPREHENSIVE METABOLIC PANEL
ALK PHOS: 100 U/L (ref 39–117)
ALT: 11 U/L (ref 0–35)
AST: 14 U/L (ref 0–37)
Albumin: 3.2 g/dL — ABNORMAL LOW (ref 3.5–5.2)
Anion gap: 12 (ref 5–15)
BUN: 13 mg/dL (ref 6–23)
CHLORIDE: 102 meq/L (ref 96–112)
CO2: 24 meq/L (ref 19–32)
Calcium: 9.3 mg/dL (ref 8.4–10.5)
Creatinine, Ser: 0.64 mg/dL (ref 0.50–1.10)
GFR calc Af Amer: >90 mL/min (ref >90)
Glucose, Bld: 320 mg/dL — ABNORMAL HIGH (ref 70–99)
Potassium: 4.4 mEq/L (ref 3.7–5.3)
SODIUM: 138 meq/L (ref 137–147)
Total Protein: 7.2 g/dL (ref 6.0–8.3)

## 2014-01-01 LAB — I-STAT TROPONIN, ED: Troponin i, poc: 0 ng/mL (ref 0.00–0.08)

## 2014-01-01 LAB — ETHANOL: Alcohol, Ethyl (B): <11 mg/dL (ref 0–11)

## 2014-01-01 LAB — PROTIME-INR
INR: 0.87 (ref 0.00–1.49)
Prothrombin Time: 11.8 seconds (ref 11.6–15.2)

## 2014-01-01 LAB — GLUCOSE, CAPILLARY
Glucose-Capillary: 315 mg/dL — ABNORMAL HIGH (ref 70–99)
Glucose-Capillary: 338 mg/dL — ABNORMAL HIGH (ref 70–99)
Glucose-Capillary: 289 mg/dL — ABNORMAL HIGH (ref 70–99)

## 2014-01-01 LAB — APTT: APTT: 30 s (ref 24–37)

## 2014-01-01 LAB — HEMOGLOBIN A1C
HEMOGLOBIN A1C: 13.1 % — AB (ref <5.7)
Mean Plasma Glucose: 329 mg/dL — ABNORMAL HIGH (ref <117)

## 2014-01-01 MED ORDER — STUDY - INVESTIGATIONAL DRUG SIMPLE RECORD
300.0000 mg | Freq: Once | Status: DC
  Filled 2014-01-01: qty 300

## 2014-01-01 MED ORDER — LORAZEPAM 2 MG/ML IJ SOLN: 0.5000 mg | Freq: Once | INTRAMUSCULAR | Status: DC

## 2014-01-01 MED ORDER — STUDY - INVESTIGATIONAL DRUG SIMPLE RECORD
100.0000 mg | Freq: Every day | Status: DC
  Administered 2014-01-02 – 2014-01-03 (×2): 100 mg via ORAL
  Filled 2014-01-01 (×2): qty 100

## 2014-01-01 MED ORDER — STUDY - INVESTIGATIONAL DRUG SIMPLE RECORD
180.0000 mg | Freq: Once | Status: DC
  Filled 2014-01-01: qty 180

## 2014-01-01 MED ORDER — INSULIN ASPART 100 UNIT/ML ~~LOC~~ SOLN
0.0000 [IU] | Freq: Three times a day (TID) | SUBCUTANEOUS | Status: DC
  Administered 2014-01-01 – 2014-01-02 (×2): 7 [IU] via SUBCUTANEOUS
  Administered 2014-01-02: 3 [IU] via SUBCUTANEOUS

## 2014-01-01 MED ORDER — SODIUM CHLORIDE 0.9 % IV SOLN
INTRAVENOUS | Status: DC
  Administered 2014-01-01: 14:00:00 via INTRAVENOUS

## 2014-01-01 MED ORDER — GABAPENTIN 100 MG PO CAPS
100.0000 mg | ORAL_CAPSULE | Freq: Every day | ORAL | Status: DC
Start: 2014-01-01 — End: 2014-01-03
  Administered 2014-01-01 – 2014-01-03 (×3): 100 mg via ORAL
  Filled 2014-01-01 (×3): qty 1

## 2014-01-01 MED ORDER — NICOTINE 21 MG/24HR TD PT24
21.0000 mg | MEDICATED_PATCH | Freq: Every day | TRANSDERMAL | Status: DC
  Administered 2014-01-01 – 2014-01-03 (×3): 21 mg via TRANSDERMAL
  Filled 2014-01-01 (×3): qty 1

## 2014-01-01 MED ORDER — ASPIRIN EC 81 MG PO TBEC
81.0000 mg | DELAYED_RELEASE_TABLET | Freq: Every day | ORAL | Status: DC
  Filled 2014-01-01: qty 1

## 2014-01-01 MED ORDER — BENZONATATE 100 MG PO CAPS
200.0000 mg | ORAL_CAPSULE | Freq: Three times a day (TID) | ORAL | Status: DC
  Administered 2014-01-01 – 2014-01-02 (×3): 200 mg via ORAL
  Filled 2014-01-01 (×6): qty 2

## 2014-01-01 MED ORDER — INSULIN GLARGINE 100 UNIT/ML ~~LOC~~ SOLN
15.0000 [IU] | Freq: Every day | SUBCUTANEOUS | Status: DC
  Administered 2014-01-02 – 2014-01-03 (×2): 15 [IU] via SUBCUTANEOUS
  Filled 2014-01-01 (×3): qty 0.15

## 2014-01-01 MED ORDER — PANTOPRAZOLE SODIUM 40 MG PO TBEC
40.0000 mg | DELAYED_RELEASE_TABLET | Freq: Every day | ORAL | Status: DC
  Administered 2014-01-02 – 2014-01-03 (×2): 40 mg via ORAL
  Filled 2014-01-01 (×2): qty 1

## 2014-01-01 MED ORDER — NITROGLYCERIN 0.4 MG SL SUBL: 0.4000 mg | SUBLINGUAL_TABLET | SUBLINGUAL | Status: DC | PRN

## 2014-01-01 MED ORDER — STUDY - INVESTIGATIONAL DRUG SIMPLE RECORD
90.0000 mg | Freq: Two times a day (BID) | Status: DC
  Administered 2014-01-02 – 2014-01-03 (×3): 90 mg via ORAL
  Filled 2014-01-01 (×3): qty 90

## 2014-01-01 MED ORDER — LORAZEPAM 2 MG/ML IJ SOLN
0.5000 mg | Freq: Once | INTRAMUSCULAR | Status: AC
  Administered 2014-01-01: 0.5 mg via INTRAVENOUS
  Filled 2014-01-01: qty 1

## 2014-01-01 MED ORDER — SENNOSIDES-DOCUSATE SODIUM 8.6-50 MG PO TABS
1.0000 | ORAL_TABLET | Freq: Every evening | ORAL | Status: DC | PRN
  Filled 2014-01-01: qty 1

## 2014-01-01 MED ORDER — LORAZEPAM 2 MG/ML IJ SOLN: 1.0000 mg | Freq: Once | INTRAMUSCULAR | Status: DC

## 2014-01-01 MED ORDER — STROKE: EARLY STAGES OF RECOVERY BOOK
Freq: Once | Status: AC
  Administered 2014-01-01: 18:00:00
  Filled 2014-01-01: qty 1

## 2014-01-01 NOTE — ED Notes (Signed)
Pt returned from scanner with no deficits.

## 2014-01-01 NOTE — ED Notes (Signed)
Admitting at bedside 

## 2014-01-01 NOTE — Progress Notes (Signed)
Pt had episode of mouth twisting and dysarthria that only lasted about 1 1/2 minutes. BP 154/72 and HR 79. Pt back to normal quickly. MD on call notified. No new orders. Cont to monitor.

## 2014-01-01 NOTE — ED Notes (Signed)
Neurologist at bedside. 

## 2014-01-01 NOTE — H&P (Signed)
Date: 01/01/2014               Patient Name:  Deborah Coffey MRN: [id number]  DOB: [birth year] Age / Sex: 58 y.o., female   PCP: Tresa Garter, MD              Medical Service: Internal Medicine Teaching Service              Attending Physician: Dr. Aldine Contes, MD    First Contact: Dr. Sherrine Maples  Pager: [phone]  Second Contact: Dr. Mickie Hillier Pager: [phone]            After Hours (After 5p/  First Contact Pager: [phone]  weekends / holidays): Second Contact Pager: [phone]   Chief Complaint: dysarthria, left face weakness   History of Present Illness: This is a 58 y.o. female with a past medical history significant for HTN, hyperlipidemia, DM, CAD s/p CABG 2005, who presents with dysarthria, and left facial weakness that was noticed on the morning of admission. She lives with her daughter who states that she last saw her normal last night around 11:30 pm on 12/31/2013. She woke up this morning, went to the bathroom and she looked into the mirror and realized that the left side of her face was droopy. Talked to her daughter and also noted slurred speech. They were both concerned for stroke and called EMS. In the ambulance, her symptoms had resolved. She states that she had a slight frontal HA for several days but denies focal weakness-numbness, vertigo, double vision, confusion, imbalance, difficulty swallowing, visual disturbances, CP, SOB, or palpitations. However, patient also states that for the past day she has been wobbly, and she did not quite feel herself, stating that her "equilibrium was off". ER staff reports fluctuating dysarthria and left face weakness since arrival to the ED. At the time of my evaluation, patient's symptoms had completely resolved. CT brain showed no acute intracranial abnormality.  Review of Systems: Constitutional: Denies fever, chills, diaphoresis, appetite change and fatigue.  HEENT: Denies photophobia, eye pain, redness, hearing loss, ear pain.  However, reports congestion , with rate, no rare, and sneezing, but without sore throat, or trouble swallowing.  Respiratory: Denies SOB, DOE, chest tightness, and wheezing. She reports productive cough of white sputum for about a week Cardiovascular: Denies chest pain, palpitations and leg swelling.  Gastrointestinal: Denies nausea, vomiting, abdominal pain, diarrhea, constipation,blood in stool and abdominal distention.  Genitourinary: Denies dysuria, urgency, frequency, hematuria, flank pain and difficulty urinating.  Musculoskeletal: Denies myalgias, back pain, joint swelling, arthralgias and gait problem.  Skin: Denies pallor, rash and wound.  Hematological: Denies adenopathy. Easy bruising, personal or family bleeding history  Psychiatric/Behavioral: Denies suicidal ideation, mood changes, confusion, nervousness, sleep disturbance and agitation.   Medications Prior to Admission  Medication Sig Dispense Refill  . aspirin 81 MG tablet Take 1 tablet (81 mg total) by mouth daily.  30 tablet  5  . Dapagliflozin Propanediol (FARXIGA) 10 MG TABS Take 10 mg by mouth daily.      Marland Kitchen gabapentin (NEURONTIN) 100 MG capsule Take 1 capsule (100 mg total) by mouth daily.  30 capsule  3  . hydrochlorothiazide (MICROZIDE) 12.5 MG capsule Take 1 capsule (12.5 mg total) by mouth daily.  30 capsule  5  . lisinopril (PRINIVIL,ZESTRIL) 10 MG tablet Take 1 tablet (10 mg total) by mouth daily.  30 tablet  5  . metFORMIN (GLUCOPHAGE XR) 500 MG 24 hr tablet Take 2 tablets (1,000 mg total) by mouth  2 (two) times daily with a meal.  120 tablet  5  . metoprolol (LOPRESSOR) 50 MG tablet Take 1 tablet (50 mg total) by mouth 2 (two) times daily.  60 tablet  4  . naproxen (NAPROSYN) 500 MG tablet Take 1 tablet (500 mg total) by mouth 2 (two) times daily with a meal.  30 tablet  0  . pantoprazole (PROTONIX) 40 MG tablet Take 1 tablet (40 mg total) by mouth daily.  30 tablet  3  . pravastatin (PRAVACHOL) 40 MG tablet Take 1  tablet (40 mg total) by mouth daily.  30 tablet  5  . senna-docusate (SENOKOT-S) 8.6-50 MG per tablet Take 1 tablet by mouth at bedtime as needed for mild constipation.  30 tablet  1  . traMADol (ULTRAM) 50 MG tablet Take 1 tablet (50 mg total) by mouth every 6 (six) hours as needed.  30 tablet  0  . nitroGLYCERIN (NITROSTAT) 0.4 MG SL tablet Place 1 tablet (0.4 mg total) under the tongue every 5 (five) minutes as needed for chest pain.  30 tablet  3    Allergies: Allergies as of 01/01/2014 - Review Complete 01/01/2014  Allergen Reaction Noted  . Ciprofloxacin Rash 10/05/2010   Past Medical History  Diagnosis Date  . CAD in native artery 02/2004    Mid LAD lesoin just after Large D1 --> CABG x 1 LIMA-LAD  . 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 competitive flow  . Hypertension   . High cholesterol   . Type II diabetes mellitus   . Anemia   . History of blood transfusion 2005    "related to OHS"   . Allergy    Past Surgical History  Procedure Laterality Date  . Cesarean section  1977; 1989  . Cholecystectomy  ~ 2012  . Tubal ligation  1989  . Coronary artery bypass graft  02/2004    "CABG X1" (03/22/2013); LIMA-LAD  . Cardiac catheterization  2010    Previous LAD 95% lesion - now ~30-40%; patent LIMA with competitive flow   Family History  Problem Relation Age of Onset  . Heart disease Mother    History   Social History  . Marital Status: Single    Spouse Name: N/A    Number of Children: N/A  . Years of Education: N/A   Occupational History  . Not on file.   Social History Main Topics  . Smoking status: Current Every Day Smoker -- 1.00 packs/day for 37 years    Types: Cigarettes  . Smokeless tobacco: Never Used  . Alcohol Use: No  . Drug Use: No  . Sexual Activity: Not Currently   Other Topics Concern  . Not on file   Social History Narrative  . No narrative on file      Physical Exam: Blood pressure  133/89, pulse 61, temperature 99 F (37.2 C), temperature source Oral, resp. rate 14, SpO2 91.00%.  General: well developed, well nourished; no acute distressed, cooperative with exam, daughter at bedside.  Head: atraumatic, normocephalic,  Eye: pupils equal, round and reactive; sclera anicteric; normal conjunctiva  Nose/throat: oropharynx clear, moist mucous membranes, pink gums  Neck: supple, no carotid bruits. Lungs/Chest wall:  coughs repeatedly in the exam room.clear to auscultation bilaterally, normal work of breathing  Heart: normal rate and regular rhythm; no murmurs Pulses: radial and dorsalis pedis pulses are 2+ and symmetric  Abdomen: Normal fullness, no rebound, guarding,  or rigidity; normal bowel sounds; no masses or organomegaly  Skin: warm, dry, intact, normal turgor, no rashes  Extremities: no peripheral edema, clubbing, or cyanosis Psych: patient is alert and oriented, mood and affect are normal and congruent, thought content is normal without delusions, thought process is linear, speech is normal and non-pressured, behavior is normal  Neurologic Exam:   Mental Status: Alert, oriented, thought content appropriate.  Speech fluent without evidence of aphasia. Able to follow 3 step commands without difficulty.  Cranial Nerves:   III/IV/VI: Extraocular movements intact.  Pupils reactive bilaterally.  V/VII: Smile symmetric. Altered left facial sensation to light tough. Right side normal sensation  VIII: Grossly intact.  XI: Bilateral shoulder shrug normal.  XII: Midline tongue extension normal.  Motor:  5/5 bilaterally with normal tone and bulk  Sensory:  Light touch intact throughout, bilaterally  DTRs: 2+ and symmetric throughout  Plantars:  Downgoing bilaterally  Cerebellar: Normal finger-to-nose, normal rapid alternating movements and normal heel-to-shin test.  Normal gait and station.    Lab results: Basic Metabolic Panel:  Recent Labs  01/01/14 0725  01/01/14 0732  NA 138 139  K 4.4 4.3  CL 102 103  CO2 24  --   GLUCOSE 320* 334*  BUN 13 12  CREATININE 0.64 0.70  CALCIUM 9.3  --    Liver Function Tests:  Recent Labs  01/01/14 0725  AST 14  ALT 11  ALKPHOS 100  BILITOT <0.2*  PROT 7.2  ALBUMIN 3.2*  CBC:  Recent Labs  01/01/14 0725 01/01/14 0732  WBC 5.8  --   NEUTROABS 3.0  --   HGB 12.2 13.9  HCT 37.3 41.0  MCV 82.3  --   PLT 295  --    Coagulation:  Recent Labs  01/01/14 0725  LABPROT 11.8  INR 0.87     Alcohol Level:  Recent Labs  01/01/14 0725  ETH <11    Imaging results:  Ct Head Wo Contrast  01/01/2014   CLINICAL DATA:  Left facial droop and slurred speech  EXAM: CT HEAD WITHOUT CONTRAST  TECHNIQUE: Contiguous axial images were obtained from the base of the skull through the vertex without intravenous contrast.  COMPARISON:  Aug 24, 2009  FINDINGS: The ventricles are normal in size and configuration. There is no mass, hemorrhage, extra-axial fluid collection, or midline shift. There is mild small vessel disease in the centra semiovale bilaterally. Elsewhere gray-white compartments appear normal. There is no demonstrable acute infarct. The bony calvarium appears intact. The mastoid air cells are clear. There is mucosal thickening in the right maxillary antrum.  IMPRESSION: Mild periventricular small vessel disease. There is no intracranial mass, hemorrhage, or acute appearing infarct. There is right maxillary sinus disease.   Electronically Signed   By: Lowella Grip M.D.   On: 01/01/2014 07:45    Other results: EKG: normal EKG, normal sinus rhythm.  Assessment & Plan by Problem: Active Problems:   URI, acute   Essential hypertension, benign   CAD (coronary artery disease) - CABG x 1 (LIMA-LAD)   DM (diabetes mellitus) type 2, uncontrolled, with ketoacidosis   Tobacco use   Stroke   This is a 58 y.o. female with a past medical history significant for HTN, hyperlipidemia, DM, CAD s/p  CABG 2005, who presents with dysarthria, and left facial weakness that was noticed on the morning of admission.  TIA/Acute Ischemia Stroke: Presentation was concerning for acute ischemic stroke versus TIA. Risk factors include hypertension, diabetes, and cigarette smoking.  Last seen normal on 12/31/2013 at 11:30 PM. Symptoms have improved. Neurologic exam unremarkable for altered sensation on the left side of her face otherwise, normal CT brain >> no acute intracranial process. NIHSS = 2. No indication for TPA. ABCD2 score of 2 indicating a low risk. Patient has been evaluated by neurology, who recommended admission for further workup. Other differentials including hypoglycemia, anxiety, syncope or migraine are less likely based on history. Plan. Admit to telemetry HgbA1c, fasting lipid panel  MRI, MRA of the brain without contrast  Echocardiogram  Carotid dopplers  Prophylactic therapy- aspirin 81 mg daily  Risk factor modification >> continue with the statin.  Telemetry monitoring  Frequent neuro checks  PT/OT SLP  Essential Hypertension: BP currently stable. Home medication include hydrochlorothiazide 12.5 mg daily, lisinopril, 10 mg daily, and metoprolol, 50 mg twice a day. Plan. -We'll hold antihypertensives for 48 hours for permissive hypertension.  Uncontrol type 2 diabetes: A1c 06/20/2013 was 10.5%. Home medication include metformin 1000 mg twice a day, and Farxiga 10 mg daily. Questionable compliance.  Plan. -Hold home hypoglycemics - CBGs a.c. each bedtime - Lantus with sliding scale insulin - Carb modified diet  CAD: Status post CABG x1 in 2005. No active symptoms of cardiac ischemia. Initial EKG unremarkable. Troponins, negative X1. Plan. - clinically monitor for chest pain  - no need to cycle enzymes  - Morning EKG - telemetry mornitoring - Will restart metoprolol after 48 hours.  Acute Bronchitis: Patient reports ongoing cough for about 1-1/2 weeks without fevers,  chills, or other constitutional symptoms. Lung exam without abnormalities.  Plan. -Symptomatic treatment for cough - Consider x-ray if she developed symptoms of pneumonia.  Smoking: She smokes half pack a day. I encouraged her to quit. Will provide a nicotine patch  Code: Full   VTE PPx: SCDs  F/E/N: Carb mod diet, NSL. Lytes are normal  Dispo: Disposition is deferred at this time, awaiting improvement of current medical problems. Anticipated discharge in approximately 1-2 day(s).   The patient does have a current PCP (Tresa Garter, MD), therefore is not require OPC follow-up after discharge.   The patient does have transportation limitations that hinder transportation to clinic appointments.   Signed:  Jessee Avers, MD PGY-3 Internal Medicine Teaching Service Pager: [phone] (7pm-7am) 01/01/2014, 9:07 AM

## 2014-01-01 NOTE — ED Notes (Signed)
PT reports she has felt weird since last night before bed; woke up and went to restroom and face was drooped; told daughter think she is having a stroke.

## 2014-01-01 NOTE — Progress Notes (Signed)
Called to assist with assessment of patient with neuro changes.  On arrival patient supine in bed - alert - warm and dry - answering questions - no neuro deficits noted.  Dr. Tomie China present with RN Elberta Fortis.  RN states patient was at baseline - was given Ativan 0.5 mg IV for sedation for MRI scan.  Shortly after infusion -patient had sudden onset left facial "twisting" describe by patient - left facial droop per RN.  Of note am BP 108/46 HR 86.  Manual BP checked on arrival 128/62.  Patient sx have resolved.  NIHHS 0.  CBG 315.  RR 16 O2 sat 100% on RA.  Patient with documented small vessel disease.  To start NS at 75 cc/hr per order.  For MRI scan.  Handoff to [employer] - to call as needed.

## 2014-01-01 NOTE — ED Notes (Signed)
A1C added on per lab tech

## 2014-01-01 NOTE — Progress Notes (Signed)
Utilization Review Completed.Deborah Coffey T9/15/2015  

## 2014-01-01 NOTE — Consult Note (Signed)
Referring Physician: Dr. Alvino Chapel    Chief Complaint: dysarthria, left face weakness  HPI:                                                                                                                                         Deborah Coffey is an 58 y.o. female with a past medical history significant for HTN, hyperlipidemia, DM, CAD s/p CABG, reportedly non adherent to treatment, comes in for further evaluation of the above stated symptoms. She lives with her daughter who told me that she last saw her normal last night around 95 30. Them she woke up this morning, went to the bathroom and she look to the mirror realized that the left side of her face was droopy. Talked to her daughter and also noted slurred speech. Said that she had a slight HA but denies focal weakness-numbness, vertigo, double vision, confusion, imbalance, difficulty swallowing, visual disturbances, CP, SOB, or palpitations. ER staff reports fluctuating dysarthria and left face weakness since arrival to the ED. At the time of my assessment patient only had subtle left face weakness and decreased sensation left face (NIHSS 2). CT brain showed no acute intracranial abnormality. On aspirin  Date last known well: 12/31/13 Time last known well: 1130 pm tPA Given: no, late presentation, mild deficits. NIHSS: 2 MRS:   Past Medical History  Diagnosis Date  . CAD in native artery 02/2004    Mid LAD lesoin just after Large D1 --> CABG x 1 LIMA-LAD  . 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 competitive flow  . Hypertension   . High cholesterol   . Type II diabetes mellitus   . Anemia   . History of blood transfusion 2005    "related to OHS"   . Allergy     Past Surgical History  Procedure Laterality Date  . Cesarean section  1977; 1989  . Cholecystectomy  ~ 2012  . Tubal ligation  1989  . Coronary artery bypass graft  02/2004    "CABG X1" (03/22/2013); LIMA-LAD  .  Cardiac catheterization  2010    Previous LAD 95% lesion - now ~30-40%; patent LIMA with competitive flow    Family History  Problem Relation Age of Onset  . Heart disease Mother    Social History:  reports that she has been smoking Cigarettes.  She has a 37 pack-year smoking history. She has never used smokeless tobacco. She reports that she does not drink alcohol or use illicit drugs.  Allergies:  Allergies  Allergen Reactions  . Ciprofloxacin Rash    Medications:  I have reviewed the patient's current medications.  ROS:                                                                                                                                       History obtained from the patient, daughter, and chart review.  General ROS: negative for - chills, fatigue, fever, night sweats, or weight loss Psychological ROS: negative for - behavioral disorder, hallucinations, memory difficulties, mood swings or suicidal ideation Ophthalmic ROS: negative for - blurry vision, double vision, eye pain or loss of vision ENT ROS: negative for - epistaxis, nasal discharge, oral lesions, sore throat, tinnitus or vertigo Allergy and Immunology ROS: negative for - hives or itchy/watery eyes Hematological and Lymphatic ROS: negative for - bleeding problems, bruising or swollen lymph nodes Endocrine ROS: negative for - galactorrhea, hair pattern changes, polydipsia/polyuria or temperature intolerance Respiratory ROS: negative for - cough, hemoptysis, shortness of breath or wheezing Cardiovascular ROS: negative for - chest pain, dyspnea on exertion, edema or irregular heartbeat Gastrointestinal ROS: negative for - abdominal pain, diarrhea, hematemesis, nausea/vomiting or stool incontinence Genito-Urinary ROS: negative for - dysuria, hematuria, incontinence or urinary  frequency/urgency Musculoskeletal ROS: negative for - joint swelling or muscular weakness Neurological ROS: as noted in HPI Dermatological ROS: negative for rash and skin lesion changes  Physical exam: pleasant female in no apparent distress. Blood pressure 135/78, pulse 88, temperature 99 F (37.2 C), temperature source Oral, resp. rate 16, SpO2 100.00%. Head: normocephalic. Neck: supple, no bruits, no JVD. Cardiac: no murmurs. Lungs: clear. Abdomen: soft, no tender, no mass. Extremities: no edema. Neurologic Examination:                                                                                                      General: Mental Status: Alert, oriented, thought content appropriate.  Speech fluent without evidence of aphasia.  Able to follow 3 step commands without difficulty. Cranial Nerves: II: Discs flat bilaterally; Visual fields grossly normal, pupils equal, round, reactive to light and accommodation III,IV, VI: ptosis not present, extra-ocular motions intact bilaterally V,VII: smile is mildly asymmetric due to left face weakness, facial light touch sensation normal bilaterally VIII: hearing normal bilaterally IX,X: gag reflex present XI: bilateral shoulder shrug XII: midline tongue extension without atrophy or fasciculations Motor: Right : Upper extremity   5/5    Left:     Upper extremity   5/5  Lower extremity   5/5     Lower extremity   5/5  Tone and bulk:normal tone throughout; no atrophy noted Sensory: Pinprick and light touch intact diminished left face. Deep Tendon Reflexes:  Right: Upper Extremity   Left: Upper extremity   biceps (C-5 to C-6) 2/4   biceps (C-5 to C-6) 2/4 tricep (C7) 2/4    triceps (C7) 2/4 Brachioradialis (C6) 2/4  Brachioradialis (C6) 2/4  Lower Extremity Lower Extremity  quadriceps (L-2 to L-4) 2/4   quadriceps (L-2 to L-4) 2/4 Achilles (S1) 2/4   Achilles (S1) 2/4  Plantars: Right: downgoing   Left: downgoing Cerebellar: normal  finger-to-nose,  normal heel-to-shin test Gait:  No tested.    Results for orders placed during the hospital encounter of 01/01/14 (from the past 48 hour(s))  PROTIME-INR     Status: None   Collection Time    01/01/14  7:25 AM      Result Value Ref Range   Prothrombin Time 11.8  11.6 - 15.2 seconds   INR 0.87  0.00 - 1.49  APTT     Status: None   Collection Time    01/01/14  7:25 AM      Result Value Ref Range   aPTT 30  24 - 37 seconds  CBC     Status: None   Collection Time    01/01/14  7:25 AM      Result Value Ref Range   WBC 5.8  4.0 - 10.5 K/uL   RBC 4.53  3.87 - 5.11 MIL/uL   Hemoglobin 12.2  12.0 - 15.0 g/dL   HCT 37.3  36.0 - 46.0 %   MCV 82.3  78.0 - 100.0 fL   MCH 26.9  26.0 - 34.0 pg   MCHC 32.7  30.0 - 36.0 g/dL   RDW 14.3  11.5 - 15.5 %   Platelets 295  150 - 400 K/uL  DIFFERENTIAL     Status: None   Collection Time    01/01/14  7:25 AM      Result Value Ref Range   Neutrophils Relative % 51  43 - 77 %   Neutro Abs 3.0  1.7 - 7.7 K/uL   Lymphocytes Relative 40  12 - 46 %   Lymphs Abs 2.4  0.7 - 4.0 K/uL   Monocytes Relative 6  3 - 12 %   Monocytes Absolute 0.3  0.1 - 1.0 K/uL   Eosinophils Relative 3  0 - 5 %   Eosinophils Absolute 0.2  0.0 - 0.7 K/uL   Basophils Relative 0  0 - 1 %   Basophils Absolute 0.0  0.0 - 0.1 K/uL  I-STAT TROPOININ, ED     Status: None   Collection Time    01/01/14  7:30 AM      Result Value Ref Range   Troponin i, poc 0.00  0.00 - 0.08 ng/mL   Comment 3            Comment: Due to the release kinetics of cTnI,     a negative result within the first hours     of the onset of symptoms does not rule out     myocardial infarction with certainty.     If myocardial infarction is still suspected,     repeat the test at appropriate intervals.  I-STAT CHEM 8, ED     Status: Abnormal   Collection Time    01/01/14  7:32 AM      Result Value Ref Range   Sodium 139  137 - 147 mEq/L  Potassium 4.3  3.7 - 5.3 mEq/L   Chloride  103  96 - 112 mEq/L   BUN 12  6 - 23 mg/dL   Creatinine, Ser 0.70  0.50 - 1.10 mg/dL   Glucose, Bld 334 (*) 70 - 99 mg/dL   Calcium, Ion 1.20  1.12 - 1.23 mmol/L   TCO2 24  0 - 100 mmol/L   Hemoglobin 13.9  12.0 - 15.0 g/dL   HCT 41.0  36.0 - 46.0 %   Ct Head Wo Contrast  01/01/2014   CLINICAL DATA:  Left facial droop and slurred speech  EXAM: CT HEAD WITHOUT CONTRAST  TECHNIQUE: Contiguous axial images were obtained from the base of the skull through the vertex without intravenous contrast.  COMPARISON:  Aug 24, 2009  FINDINGS: The ventricles are normal in size and configuration. There is no mass, hemorrhage, extra-axial fluid collection, or midline shift. There is mild small vessel disease in the centra semiovale bilaterally. Elsewhere gray-white compartments appear normal. There is no demonstrable acute infarct. The bony calvarium appears intact. The mastoid air cells are clear. There is mucosal thickening in the right maxillary antrum.  IMPRESSION: Mild periventricular small vessel disease. There is no intracranial mass, hemorrhage, or acute appearing infarct. There is right maxillary sinus disease.   Electronically Signed   By: Lowella Grip M.D.   On: 01/01/2014 07:45    Assessment: 58 y.o. female with multiple risk factors for stroke presents with new onset dysarthria and left face weakness. NIHSS 2, but fluctuating symptoms in the ED. Probable right brain stroke. CT brain without acute abnormality. Mild deficits, thus will not offer treatment with thrombolytics. Admit to medicine and complete stroke work up, She passed the bedside swallowing evaluation and will continue aspirin pending results stroke work up.    Stroke Risk Factors -  HTN, hyperlipidemia, DM, CAD  Plan: 1. HgbA1c, fasting lipid panel 2. MRI, MRA  of the brain without contrast 3. Echocardiogram 4. Carotid dopplers 5. Prophylactic therapy-aspirin 6. Risk factor modification 7. Telemetry monitoring 8. Frequent  neuro checks 9. PT/OT SLP   Dorian Pod, MD [facility] [phone]  01/01/2014, 8:03 AM

## 2014-01-01 NOTE — Progress Notes (Signed)
Pt had another episode of mouth twisting and dysarthria that lasted about 2-3 min. BP 132/54 HR 83 Sats 100% on room air. At this time pts face and speech back to normal. Will cont to monitor.

## 2014-01-01 NOTE — H&P (Signed)
Pt seen and examined with Dr. Eulas Post. Case d/w Dr. Alice Rieger. Please refer to resident note for details  In brief, 58 y/o female with PMH of HTN, HLD, DM, CAD s/p CABG p/w slurred speech, L facial droop * 1 day. Pt states that she felt well last night but when she woke up this AM she noted she had a left sided facial droop. She talked to her daughter who noted slurred speech and EMS was called for possible stroke. Pt states symptoms resolved in ambukance but she has has 2 more episodes since then each lasting approx 5-10 min while the first one lasted approx 20 min. Pt also c/o frontal HA this AM. No CP, no sob, no palpitations, no diaphoresis, no blurry vision, no focal weakness, no tingling/numbness. Did c/o altered sensation on the left side of her face. Remaining ROS negative  Exam: Cardio: RRR, normal heart sounds Lungs: CTA b/l Abd: soft, non tender, BS+ Ext: no pedal edema Gen: AAO*3, NAD Neuro: Power 5/5 b/l UE, LE, sensation intact except mildly decreased over left side of face, CN intact  Assessment and Plan: 58 y/o female with likely TIA  Likely TIA: - Head CT, MRI/MRA noted- no acute CVA, mild microvascular ischemic changes - 2 D ECHO with no thrombus - Monitor on tele- r/o occult afib/flutter - c/w asa. Consider changing to plavix - f/u carotid dopplers - Allow permissive HTN for now  HTN: - Home BP meds on hold for now to allow for permissive HTN - BP was low normal earlier today- started on IVF for now  DM: - Pt with elevated A1C. Will need insulin on d/c - Pt with difficulty affording insulin - Diabetes coordinator note noted - pt may qualify for match program - Would add premeal insulin given BS of 300s  Case d/w pt, residents in detail Dr. Lynnae January to assume care in AM

## 2014-01-01 NOTE — Progress Notes (Signed)
  Echocardiogram 2D Echocardiogram has been performed.  Donata Clay 01/01/2014, 1:43 PM

## 2014-01-01 NOTE — Progress Notes (Signed)
Pts CBG 289 and no SSI ordered for night time. MD on call made aware. No new orders. Cont to monitor.

## 2014-01-01 NOTE — Progress Notes (Signed)
Inpatient Diabetes Program Recommendations  AACE/ADA: New Consensus Statement on Inpatient Glycemic Control (2013)  Target Ranges:  Prepandial:   less than 140 mg/dL      Peak postprandial:   less than 180 mg/dL (1-2 hours)      Critically ill patients:  140 - 180 mg/dL     Results for Deborah Coffey, Deborah Coffey (MRN [id number]) as of 01/01/2014 16:11  Ref. Range 01/01/2014 11:01  Glucose-Capillary Latest Range: 70-99 mg/dL 315 (H)    Results for Deborah Coffey, Deborah Coffey (MRN [id number]) as of 01/01/2014 16:11  Ref. Range 01/01/2014 07:25  Hemoglobin A1C Latest Range: <5.7 % 13.1 (H)    Admitted with left face weakness.  History of DM2, HTN, CAD.  Home DM Meds:  Farxiga 10 mg daily Metformin 1000 mg bid  Current Meds:  Lantus 15 units daily (started today) Novolog Sensitive SSI (started today)     Spoke with patient about her A1c of 13.1%.  Explained what an A1c is and what it measures.  Reminded patient that her goal A1c is 7% or less per ADA standards to prevent both acute and long-term complications.  Encouraged patient to check her CBGs at least bid at home (fasting and another check within the day) and to record all CBGs in a logbook for her PCP to review.  Patient stated to me that she was taking Novolog 70/30 insulin bid with meals (pen form) at home last year.  MD at the [facility] and Wellness clinic stopped her insulin and started her on two oral medications b/c she could not afford her insulin anymore.  Per records, patient did have the orange card last year but it was due to expire on 04/01/13 (per care management note from 03/23/13).  Not sure if patient currently has the orange card.  Have asked Hassan Rowan with care management to check on patient's status with the orange card and also to check to see if patient has used the [facility] program within the last year.  I called the pharmacy at the [facility] and Wellness center today.  Pharmacist told me that if patient can get the  [facility] program during this hospitalization that she can get Lantus or Toujeo for basal insulin and Novolog at the clinic and that the folks at the Wellness clinic can get patient signed up for the Patient Assistance program so she can get her insulins for low cost from there now on at the [place].  Patient likely needs insulin at home based on her A1c of 13.1%.  Patient is willing to take insulin and she told me she is willing to take whatever she needs to in order to feel better.  Patient endorses symptoms of hyperglycemia for a while now (fatigue, thirst, frequent urination, dry skin) and told me she just wants to feel better.   MD- Have asked care management to check to see if patient is eligible for MATCH this admission.  If she is, we can give her Rxs for Lantus or Toujeo and Novolog pens and she can get further assistance from the Wellness clinic after discharge.  Will follow along and revisit patient tomorrow to assess status of MATCH assistance.  If patient is not eligible for [facility] program, it may be better to discharge her home on 70/30 insulin.  Patient can purchase Reli-on brand of 70/30 insulin at [facility] for $25 per vial.  Patient would need to take 70/30 insulin bid with meals at home.     Will follow Deborah Coffey  Rosebud Poles RN, MSN, CDE Diabetes Coordinator Inpatient Diabetes Program Team Pager: [phone] (8a-10p)

## 2014-01-01 NOTE — ED Notes (Signed)
Pt to scanner 1

## 2014-01-01 NOTE — ED Notes (Signed)
Pt woke up around 6:20am and felt like left mouth was droopy and speech slurred. Pt alert and oriented x 4. Appears to have slurred speech and left facial droop that comes and goes since arriving to the ER. Pt complaining of left side pain that is chronic in nature.

## 2014-01-01 NOTE — ED Provider Notes (Signed)
CSN: [id number]     Arrival date & time 01/01/14  1962 History   First MD Initiated Contact with Patient 01/01/14 0703     Chief Complaint  Patient presents with  . Transient Ischemic Attack     (Consider location/radiation/quality/duration/timing/severity/associated sxs/prior Treatment) The history is provided by the patient.   patient presents with facial droop and difficulty speaking. States that but she began to feel a dull headache and some unsteadiness yesterday. She states that when she woke up this morning her face was drooping and she was having difficulty speaking. Upon arrival to the ER her symptoms had mostly resolved, but after being put in a room the symptoms have returned. It is then also resolved. No confusion. No fevers. No numbness or weakness of the extremities. No previous stroke.  Past Medical History  Diagnosis Date  . CAD in native artery 02/2004    Mid LAD lesoin just after Large D1 --> CABG x 1 LIMA-LAD  . 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 competitive flow  . Hypertension   . High cholesterol   . Type II diabetes mellitus   . Anemia   . History of blood transfusion 2005    "related to OHS"   . Allergy    Past Surgical History  Procedure Laterality Date  . Cesarean section  1977; 1989  . Cholecystectomy  ~ 2012  . Tubal ligation  1989  . Coronary artery bypass graft  02/2004    "CABG X1" (03/22/2013); LIMA-LAD  . Cardiac catheterization  2010    Previous LAD 95% lesion - now ~30-40%; patent LIMA with competitive flow   Family History  Problem Relation Age of Onset  . Heart disease Mother    History  Substance Use Topics  . Smoking status: Current Every Day Smoker -- 1.00 packs/day for 37 years    Types: Cigarettes  . Smokeless tobacco: Never Used  . Alcohol Use: No   OB History   Grav Para Term Preterm Abortions TAB SAB Ect Mult Living                 Review of Systems  Constitutional:  Negative for activity change and appetite change.  Eyes: Negative for pain.  Respiratory: Negative for chest tightness and shortness of breath.   Cardiovascular: Negative for chest pain and leg swelling.  Gastrointestinal: Negative for nausea, vomiting, abdominal pain and diarrhea.  Genitourinary: Negative for flank pain.  Musculoskeletal: Negative for back pain and neck stiffness.  Skin: Negative for rash.  Neurological: Positive for speech difficulty. Negative for weakness, numbness and headaches.  Psychiatric/Behavioral: Negative for behavioral problems.      Allergies  Ciprofloxacin  Home Medications   Prior to Admission medications   Medication Sig Start Date End Date Taking? Authorizing Provider  aspirin 81 MG tablet Take 1 tablet (81 mg total) by mouth daily. 02/14/13  Yes Ripudeep K Rai, MD  FARXIGA 10 MG TABS TAKE 1 TABLET BY MOUTH ONCE DAILY   Yes Olugbemiga E Jegede, MD  gabapentin (NEURONTIN) 100 MG capsule Take 1 capsule (100 mg total) by mouth daily. 02/14/13  Yes Ripudeep Krystal Eaton, MD  hydrochlorothiazide (MICROZIDE) 12.5 MG capsule Take 1 capsule (12.5 mg total) by mouth daily. 02/14/13  Yes Ripudeep Krystal Eaton, MD  lisinopril (PRINIVIL,ZESTRIL) 10 MG tablet Take 1 tablet (10 mg total) by mouth daily. 02/14/13  Yes Ripudeep Krystal Eaton, MD  metFORMIN (GLUCOPHAGE XR) 500  MG 24 hr tablet Take 2 tablets (1,000 mg total) by mouth 2 (two) times daily with a meal. 02/14/13  Yes Ripudeep K Rai, MD  metoprolol (LOPRESSOR) 50 MG tablet Take 1 tablet (50 mg total) by mouth 2 (two) times daily. 02/14/13  Yes Ripudeep Krystal Eaton, MD  naproxen (NAPROSYN) 500 MG tablet Take 1 tablet (500 mg total) by mouth 2 (two) times daily with a meal. 03/19/13  Yes Kalman Drape, MD  pantoprazole (PROTONIX) 40 MG tablet Take 1 tablet (40 mg total) by mouth daily. 03/23/13  Yes Ripudeep Krystal Eaton, MD  pravastatin (PRAVACHOL) 40 MG tablet Take 1 tablet (40 mg total) by mouth daily. 02/14/13  Yes Ripudeep Krystal Eaton, MD   senna-docusate (SENOKOT-S) 8.6-50 MG per tablet Take 1 tablet by mouth at bedtime as needed for mild constipation. 03/23/13  Yes Ripudeep Krystal Eaton, MD  traMADol (ULTRAM) 50 MG tablet Take 1 tablet (50 mg total) by mouth every 6 (six) hours as needed. 03/23/13  Yes Ripudeep Krystal Eaton, MD  methocarbamol (ROBAXIN-750) 750 MG tablet Take 2 tablets (1,500 mg total) by mouth 3 (three) times daily. 03/19/13   Kalman Drape, MD  nitroGLYCERIN (NITROSTAT) 0.4 MG SL tablet Place 1 tablet (0.4 mg total) under the tongue every 5 (five) minutes as needed for chest pain. 03/23/13   Ripudeep K Rai, MD   BP 130/77  Pulse 87  Temp(Src) 99 F (37.2 C) (Oral)  Resp 15  SpO2 100% Physical Exam  Constitutional: She is oriented to person, place, and time. She appears well-developed and well-nourished.  HENT:  Head: Normocephalic.  Eyes: EOM are normal. Pupils are equal, round, and reactive to light.  Neck: Neck supple.  Cardiovascular: Normal rate and regular rhythm.   Pulmonary/Chest: Effort normal.  Abdominal: Soft.  Musculoskeletal: Normal range of motion.  Neurological: She is alert and oriented to person, place, and time. A cranial nerve deficit is present.  Paresthesias to left face. Minimal left-sided facial droop. May have forehead sparing. Normal speech. Good grip strength bilaterally. Finger-nose slightly slow on the left, but able to touch nose. Complete NIH score done by neurology.  Skin: Skin is warm.    ED Course  Procedures (including critical care time) Labs Review Labs Reviewed  COMPREHENSIVE METABOLIC PANEL - Abnormal; Notable for the following:    Glucose, Bld 320 (*)    Albumin 3.2 (*)    Total Bilirubin <0.2 (*)    All other components within normal limits  I-STAT CHEM 8, ED - Abnormal; Notable for the following:    Glucose, Bld 334 (*)    All other components within normal limits  ETHANOL  PROTIME-INR  APTT  CBC  DIFFERENTIAL  URINE RAPID DRUG SCREEN (HOSP PERFORMED)  URINALYSIS,  ROUTINE W REFLEX MICROSCOPIC  HEMOGLOBIN A1C  I-STAT TROPOININ, ED    Imaging Review Ct Head Wo Contrast  01/01/2014   CLINICAL DATA:  Left facial droop and slurred speech  EXAM: CT HEAD WITHOUT CONTRAST  TECHNIQUE: Contiguous axial images were obtained from the base of the skull through the vertex without intravenous contrast.  COMPARISON:  Aug 24, 2009  FINDINGS: The ventricles are normal in size and configuration. There is no mass, hemorrhage, extra-axial fluid collection, or midline shift. There is mild small vessel disease in the centra semiovale bilaterally. Elsewhere gray-white compartments appear normal. There is no demonstrable acute infarct. The bony calvarium appears intact. The mastoid air cells are clear. There is mucosal thickening in the right maxillary  antrum.  IMPRESSION: Mild periventricular small vessel disease. There is no intracranial mass, hemorrhage, or acute appearing infarct. There is right maxillary sinus disease.   Electronically Signed   By: Lowella Grip M.D.   On: 01/01/2014 07:45     EKG Interpretation   Date/Time:  Tuesday January 01 2014 07:07:58 EDT Ventricular Rate:  85 PR Interval:  161 QRS Duration: 89 QT Interval:  376 QTC Calculation: 447 R Axis:   38 Text Interpretation:  Sinus rhythm Confirmed by Alvino Chapel  MD, Ovid Curd  [id number]) on 01/01/2014 8:46:46 AM      MDM   Final diagnoses:  Stroke    Patient with left-sided facial droop. It has been somewhat stuttering in the ER. Last normal was less than before she went to bed. She does have low NIH score. TPA candidate due to the low NIH score and onset. Seen by neurology in ER. CT reassuring. Will admit to internal medicine.    Jasper Riling. Alvino Chapel, [place] 01/01/14 [phone]

## 2014-01-02 ENCOUNTER — Inpatient Hospital Stay (HOSPITAL_COMMUNITY): Payer: No Typology Code available for payment source

## 2014-01-02 DIAGNOSIS — G459 Transient cerebral ischemic attack, unspecified: Secondary | ICD-10-CM

## 2014-01-02 DIAGNOSIS — E1165 Type 2 diabetes mellitus with hyperglycemia: Secondary | ICD-10-CM

## 2014-01-02 DIAGNOSIS — I6350 Cerebral infarction due to unspecified occlusion or stenosis of unspecified cerebral artery: Secondary | ICD-10-CM

## 2014-01-02 DIAGNOSIS — I10 Essential (primary) hypertension: Secondary | ICD-10-CM

## 2014-01-02 DIAGNOSIS — E1310 Other specified diabetes mellitus with ketoacidosis without coma: Secondary | ICD-10-CM

## 2014-01-02 DIAGNOSIS — R29810 Facial weakness: Secondary | ICD-10-CM | POA: Diagnosis present

## 2014-01-02 DIAGNOSIS — IMO0001 Reserved for inherently not codable concepts without codable children: Secondary | ICD-10-CM

## 2014-01-02 DIAGNOSIS — F17200 Nicotine dependence, unspecified, uncomplicated: Secondary | ICD-10-CM

## 2014-01-02 DIAGNOSIS — I25810 Atherosclerosis of coronary artery bypass graft(s) without angina pectoris: Secondary | ICD-10-CM

## 2014-01-02 DIAGNOSIS — H539 Unspecified visual disturbance: Secondary | ICD-10-CM

## 2014-01-02 LAB — URINALYSIS, ROUTINE W REFLEX MICROSCOPIC
BILIRUBIN URINE: NEGATIVE
Glucose, UA: >1000 mg/dL — AB
Hgb urine dipstick: NEGATIVE
Ketones, ur: NEGATIVE mg/dL
Leukocytes, UA: NEGATIVE
Nitrite: NEGATIVE
Protein, ur: NEGATIVE mg/dL
Specific Gravity, Urine: 1.021 (ref 1.005–1.030)
UROBILINOGEN UA: 0.2 mg/dL (ref 0.0–1.0)
pH: 6.5 (ref 5.0–8.0)

## 2014-01-02 LAB — LIPID PANEL
Cholesterol: 141 mg/dL (ref 0–200)
HDL: 67 mg/dL (ref >39)
LDL Cholesterol: 45 mg/dL (ref 0–99)
Total CHOL/HDL Ratio: 2.1 RATIO
Triglycerides: 145 mg/dL (ref <150)
VLDL: 29 mg/dL (ref 0–40)

## 2014-01-02 LAB — RAPID URINE DRUG SCREEN, HOSP PERFORMED
Amphetamines: NOT DETECTED
BARBITURATES: NOT DETECTED
Benzodiazepines: NOT DETECTED
Cocaine: NOT DETECTED
Opiates: NOT DETECTED
TETRAHYDROCANNABINOL: NOT DETECTED

## 2014-01-02 LAB — GLUCOSE, CAPILLARY
GLUCOSE-CAPILLARY: 321 mg/dL — AB (ref 70–99)
Glucose-Capillary: 223 mg/dL — ABNORMAL HIGH (ref 70–99)
Glucose-Capillary: 328 mg/dL — ABNORMAL HIGH (ref 70–99)
Glucose-Capillary: 299 mg/dL — ABNORMAL HIGH (ref 70–99)
Glucose-Capillary: 160 mg/dL — ABNORMAL HIGH (ref 70–99)

## 2014-01-02 LAB — HEMOGLOBIN A1C
Hgb A1c MFr Bld: 12.9 % — ABNORMAL HIGH (ref <5.7)
MEAN PLASMA GLUCOSE: 324 mg/dL — AB (ref <117)

## 2014-01-02 LAB — URINE MICROSCOPIC-ADD ON

## 2014-01-02 MED ORDER — INSULIN ASPART 100 UNIT/ML ~~LOC~~ SOLN
5.0000 [IU] | Freq: Once | SUBCUTANEOUS | Status: AC
  Administered 2014-01-02: 5 [IU] via SUBCUTANEOUS

## 2014-01-02 MED ORDER — TRAMADOL HCL 50 MG PO TABS
50.0000 mg | ORAL_TABLET | Freq: Once | ORAL | Status: AC
  Administered 2014-01-02: 50 mg via ORAL
  Filled 2014-01-02: qty 1

## 2014-01-02 MED ORDER — INSULIN ASPART 100 UNIT/ML ~~LOC~~ SOLN: 0.0000 [IU] | Freq: Every day | SUBCUTANEOUS | Status: DC

## 2014-01-02 MED ORDER — INSULIN ASPART 100 UNIT/ML ~~LOC~~ SOLN
0.0000 [IU] | Freq: Three times a day (TID) | SUBCUTANEOUS | Status: DC
  Administered 2014-01-02: 8 [IU] via SUBCUTANEOUS
  Administered 2014-01-03: 3 [IU] via SUBCUTANEOUS
  Administered 2014-01-03: 8 [IU] via SUBCUTANEOUS

## 2014-01-02 MED ORDER — ATORVASTATIN CALCIUM 10 MG PO TABS
10.0000 mg | ORAL_TABLET | Freq: Every day | ORAL | Status: DC
  Administered 2014-01-02: 10 mg via ORAL
  Filled 2014-01-02 (×2): qty 1

## 2014-01-02 MED ORDER — BENZONATATE 100 MG PO CAPS
200.0000 mg | ORAL_CAPSULE | Freq: Two times a day (BID) | ORAL | Status: DC | PRN
  Filled 2014-01-02: qty 2

## 2014-01-02 NOTE — Progress Notes (Signed)
STROKE TEAM PROGRESS NOTE   HISTORY Deborah Coffey is an 58 y.o. female with a past medical history significant for HTN, hyperlipidemia, DM, CAD s/p CABG, reportedly non adherent to treatment, comes in for further evaluation of the above stated symptoms.  She lives with her daughter who told me that she last saw her normal last night around 59 30. Them she woke up this morning, went to the bathroom and she look to the mirror realized that the left side of her face was droopy. Talked to her daughter and also noted slurred speech.  Said that she had a slight HA but denies focal weakness-numbness, vertigo, double vision, confusion, imbalance, difficulty swallowing, visual disturbances, CP, SOB, or palpitations.  ER staff reports fluctuating dysarthria and left face weakness since arrival to the ED. At the time of my assessment patient only had subtle left face weakness and decreased sensation left face (NIHSS 2).  CT brain showed no acute intracranial abnormality.  On aspirin  Date last known well: 12/31/13  Time last known well: 1130 pm  tPA Given: no, late presentation, mild deficits.  NIHSS: 2  SUBJECTIVE (INTERVAL HISTORY) No one is at the bedside.  Overall she feels her condition is unchanged. She stated that yesterday morning she had one episode of left facial droop and resolved in 5 min. At that time BP 108/46. Consider dehydration and was given IVF. Her CBG 315 at that time. MRI negative for acute stroke and she was enrolled to [facility] trial.   This morning, during internal medicine rounding, she had another witnessed episode of left face droop and resolved in about one min. Her currently NIHSS = 0. Will order EEG.   OBJECTIVE Temp:  [97.8 F (36.6 C)-98.7 F (37.1 C)] 98.3 F (36.8 C) (09/16 1600) Pulse Rate:  [79-88] 82 (09/16 1600) Cardiac Rhythm:  [-] Normal sinus rhythm (09/16 0808) Resp:  [16] 16 (09/16 1600) BP: (120-154)/(52-96) 132/59 mmHg (09/16 1600) SpO2:  [99 %-100  %] 99 % (09/16 1600)   Recent Labs Lab 01/01/14 2136 01/02/14 0745 01/02/14 1142 01/02/14 1527 01/02/14 1645  GLUCAP 289* 223* 328* 321* 299*    Recent Labs Lab 01/01/14 0725 01/01/14 0732  NA 138 139  K 4.4 4.3  CL 102 103  CO2 24  --   GLUCOSE 320* 334*  BUN 13 12  CREATININE 0.64 0.70  CALCIUM 9.3  --     Recent Labs Lab 01/01/14 0725  AST 14  ALT 11  ALKPHOS 100  BILITOT <0.2*  PROT 7.2  ALBUMIN 3.2*    Recent Labs Lab 01/01/14 0725 01/01/14 0732  WBC 5.8  --   NEUTROABS 3.0  --   HGB 12.2 13.9  HCT 37.3 41.0  MCV 82.3  --   PLT 295  --    No results found for this basename: CKTOTAL, CKMB, CKMBINDEX, TROPONINI,  in the last 168 hours  Recent Labs  01/01/14 0725  LABPROT 11.8  INR 0.87    Recent Labs  01/02/14 0410  COLORURINE YELLOW  LABSPEC 1.021  PHURINE 6.5  GLUCOSEU >1000*  HGBUR NEGATIVE  BILIRUBINUR NEGATIVE  KETONESUR NEGATIVE  PROTEINUR NEGATIVE  UROBILINOGEN 0.2  NITRITE NEGATIVE  LEUKOCYTESUR NEGATIVE       Component Value Date/Time   CHOL 141 01/02/2014 0002   TRIG 145 01/02/2014 0002   HDL 67 01/02/2014 0002   CHOLHDL 2.1 01/02/2014 0002   VLDL 29 01/02/2014 0002   LDLCALC 45 01/02/2014 0002   Lab Results  Component Value Date   HGBA1C 12.9* 01/02/2014      Component Value Date/Time   LABOPIA NONE DETECTED 01/02/2014 0410   COCAINSCRNUR NONE DETECTED 01/02/2014 0410   LABBENZ NONE DETECTED 01/02/2014 0410   AMPHETMU NONE DETECTED 01/02/2014 0410   THCU NONE DETECTED 01/02/2014 0410   LABBARB NONE DETECTED 01/02/2014 0410     Recent Labs Lab 01/01/14 0725  ETH <11    Ct Head Wo Contrast  01/01/2014   IMPRESSION: Mild periventricular small vessel disease. There is no intracranial mass, hemorrhage, or acute appearing infarct. There is right maxillary sinus disease.     Mri and Mra Head Wo Contrast  01/01/2014   IMPRESSION: Negative for acute infarct  Mild chronic microvascular ischemic change in the white  matter.  Negative MRA head      2D echo - Left ventricle: The cavity size was normal. Wall thickness was normal. Systolic function was normal. The estimated ejection fraction was in the range of 55% to 65%. Wall motion was normal; there were no regional wall motion abnormalities. - Right atrium: The atrium was mildly dilated.  CUS - Bilateral: 1-39% ICA stenosis. Vertebral artery flow is antegrade.   PHYSICAL EXAM  Temp:  [97.8 F (36.6 C)-98.7 F (37.1 C)] 98.3 F (36.8 C) (09/16 1600) Pulse Rate:  [79-88] 82 (09/16 1600) Resp:  [16] 16 (09/16 1600) BP: (120-154)/(52-96) 132/59 mmHg (09/16 1600) SpO2:  [99 %-100 %] 99 % (09/16 1600)  General - Well nourished, well developed, in no apparent distress but mildly anxious.  Ophthalmologic - Sharp disc margins OU.  Cardiovascular - Regular rate and rhythm with no murmur.  Mental Status -  Level of arousal and orientation to time, place, and person were intact. Language including expression, naming, repetition, comprehension was assessed and found intact.  Cranial Nerves II - XII - II - Visual field intact OU. III, IV, VI - Extraocular movements intact. V - Facial sensation intact bilaterally. VII - Facial movement intact bilaterally. VIII - Hearing & vestibular intact bilaterally. X - Palate elevates symmetrically. XI - Chin turning & shoulder shrug intact bilaterally. XII - Tongue protrusion intact.  Motor Strength - The patient's strength was normal in all extremities and pronator drift was absent.  Bulk was normal and fasciculations were absent.   Motor Tone - Muscle tone was assessed at the neck and appendages and was normal.  Reflexes - The patient's reflexes were normal in all extremities and she had no pathological reflexes.  Sensory - Light touch, temperature/pinprick were assessed and were normal.    Coordination - The patient had normal movements in the hands and feet with no ataxia or dysmetria.  Tremor was  absent.  Gait and Station - The patient's transfers, posture, gait, station, and turns were observed as normal.   ASSESSMENT/PLAN  Deborah Coffey is a 58 y.o. female with history of HTN, DM, current smoker presenting with transient left facial droop and slurry speech. She did not receive IV t-PA due to resolving symptoms. Imaging showed no acute infarct.   TIA:  likely thrombotic secondary to small vessel disease     aspirin 81 mg orally every day prior to admission, now on [facility] clinical trails  MRI  No acute infarct  MRA  negative  Carotid Doppler  unremarkable  2D Echo  unremarkable   LDL 45, on lipitor 10mg  due to recurrent symptoms.  HgbA1c 12.9-13.1, indicating DM not in control  SCDs for VTE prophylaxis  Carb Control  Activity as tolerated  Pt had recurrent symptoms, most likely recurrent small vessel event TIA in the setting of poorly controlled DM, but will do EEG to rule out seizure activity.  Diabetes  Home meds:  metformine  HgbA1c 13.1   Uncontrolled  Goal < 7.0  On insulin now  educated patient about lifestyle changes for diabetes treatment  Hypertension   Home meds:  Lisinopri, metoprolol, HCTZ. Not resumed in hospital for permissive hypertension  BP  108-154 past 24h (01/02/2014 @ 6:16 PM)  SBP goal permissive hypertension for the first 24-48 hours and then gradually normalized in 5-7 days.  Stable  Patient counseled to be compliant with her blood pressure medications  Hyperlipidemia  Home meds:  pravastatin. Changed in hospital low dose lipitor due to recurrent symptoms  LDL 45  LDL goal <70 for diabetics  Smoker - smoking cessation counseling provided - nicotine patch applied.  Other Stroke Risk Factors   Obesity, Body mass index is 36.27 kg/(m^2).   Hospital day # 1  Rosalin Hawking, MD PhD Stroke Neurology 01/02/2014 6:37 PM      To contact Stroke Continuity provider, please refer to [email or web address] After  hours, contact General Neurology

## 2014-01-02 NOTE — Progress Notes (Signed)
OT Cancellation Note  Patient Details Name: Deborah Coffey MRN: [id number] DOB: 09/01/1955   Cancelled Treatment:    Reason Eval/Treat Not Completed: Patient at procedure or test/ unavailable.  Will check back tomorrow to proceed with evaluation.  Chino Sardo OTR/L 01/02/2014, 2:42 PM

## 2014-01-02 NOTE — Procedures (Signed)
ELECTROENCEPHALOGRAM REPORT  Patient: Deborah Coffey       Room #: 4J17 EEG No. ID: [id number] Age: 58 y.o.        Sex: female Referring Physician: Orie Fisherman Report Date:  01/02/2014        Interpreting Physician: Anthony Sar  History: Deborah Coffey is an 58 y.o. female who was admitted following repeated episodes of experiencing scotomas and drooping of left side of her face.  Indications for study:  Rule out focal seizure disorder.  Technique: This is an 18 channel routine scalp EEG performed at the bedside with bipolar and monopolar montages arranged in accordance to the international 10/20 system of electrode placement.   Description: This EEG recording was performed during wakefulness and during sleep. Her daughter background activity during wakefulness consisted of low amplitude diffuse symmetrical beta activity. Photic stimulation produced a minimal occipital driving response. Hyperventilation was not performed. During stage II of sleep symmetrical vertex waves, sleep spindles and arousal responses were recorded. No epileptiform discharges were recorded. There was no abnormal slowing of cerebral activity.  Interpretation: This is a normal EEG recording during wakefulness and during sleep. No evidence of an epileptic disorder is demonstrated.   Rush Farmer M.D. [facility] [phone]

## 2014-01-02 NOTE — Progress Notes (Signed)
Inpatient Diabetes Program Recommendations  AACE/ADA: New Consensus Statement on Inpatient Glycemic Control (2013)  Target Ranges:  Prepandial:   less than 140 mg/dL      Peak postprandial:   less than 180 mg/dL (1-2 hours)      Critically ill patients:  140 - 180 mg/dL     Results for Deborah Coffey, Deborah Coffey (MRN [id number]) as of 01/02/2014 08:45  Ref. Range 01/01/2014 11:01 01/01/2014 17:17 01/01/2014 21:36  Glucose-Capillary Latest Range: 70-99 mg/dL 315 (H) 338 (H) 289 (H)    Results for Deborah Coffey, Deborah Coffey (MRN [id number]) as of 01/02/2014 08:45  Ref. Range 01/02/2014 07:45  Glucose-Capillary Latest Range: 70-99 mg/dL 223 (H)     Admitted with left face weakness. History of DM2, HTN, CAD.   Home DM Meds:  Farxiga 10 mg daily  Metformin 1000 mg bid   Current Meds:  Lantus 15 units daily  Novolog Sensitive SSI   Patient Never received Lantus 15 units yesterday.  Unsure why she did not receive her Lantus.  DM Coordinator spoke with patient yesterday about her DM care regimen and her A1c (See note from 09/15).    MD- Have asked care management to check to see if patient is eligible for MATCH this admission. If she is, we can give her Rxs for Lantus or Toujeo and Novolog pens and she can get further assistance from the Wellness clinic after discharge. Will follow along and revisit patient tomorrow to assess status of MATCH assistance.   If patient is not eligible for [facility] program, it may be better to discharge her home on 70/30 insulin. Patient can purchase Reli-on brand of 70/30 insulin at [facility] for $25 per vial. Patient would need to take 70/30 insulin bid with meals at home.     Will follow [name] RN, MSN, CDE Diabetes Coordinator Inpatient Diabetes Program Team Pager: [phone] (8a-10p)

## 2014-01-02 NOTE — Progress Notes (Signed)
*  PRELIMINARY RESULTS* Vascular Ultrasound Carotid Duplex (Doppler) has been completed.  Preliminary findings: Bilateral:  1-39% ICA stenosis.  Vertebral artery flow is antegrade.      Landry Mellow, RDMS, RVT  01/02/2014, 10:59 AM

## 2014-01-02 NOTE — Progress Notes (Signed)
Daugther at bedside called me into room, stating that pt was having another facial twitching episode.  Pt was distress, in tears, stated she felt "cloudy in her head" during the episodes.  Some are more severe than others.  Dr. Drucilla Schmidt notified.  It is ok for pt to travel to EEG right now.  Emotional support given to pt and daughter.  Will continue to monitor.

## 2014-01-02 NOTE — Progress Notes (Signed)
  Date: 01/02/2014  Patient name: Deborah Coffey  Medical record number: [id number]  Date of birth: 02/13/1956   This patient has been seen and the plan of care was discussed with the house staff. Please see their note for complete details. I concur with their findings with the following additions/corrections: Pt is tearful and under a lot of stress at home as her daughter lives with her and her three young grandkids require a lot of attention. She does not get a lot of sleep. Has no one to talk to. Seeing [facility]. Is open to antidepressant and / or counseling as outpt. She cont to have episodes of L facial drooping. She had an episode when Dr Sherrine Maples and I were in room. Soon after she sat up, sudden onset, oriented during episode and resolved within 1.5 minutes. She felt her whole body was ill during episode. Also had dizziness and slight truncal instability so return to supine position.   Exam HRRR no MRG Neuro (performed by Dr Sherrine Maples) only abnl was heightened sensation of L V2 and V3.  A/P 1. Presumed TIA's - this is our currently working dx. Her ABCD score is 3, two-day stroke risk of 1 percent. Concerning that she cont to have episodes that are ongoing and witnessed. Neuro is following. She is in Socrates trial and statin added today. Other etiologies proposed inc seizure (EEG pending) and complex migraines.   2. HTN - home meds held for now and will follow BP.  3. Depression - once stable, pt will need $4 SSRI and information on [facility].     Bartholomew Crews, MD 01/02/2014, 1:22 PM

## 2014-01-02 NOTE — Progress Notes (Addendum)
Inpatient Diabetes Program Recommendations  AACE/ADA: New Consensus Statement on Inpatient Glycemic Control (2013)  Target Ranges:  Prepandial:   less than 140 mg/dL      Peak postprandial:   less than 180 mg/dL (1-2 hours)      Critically ill patients:  140 - 180 mg/dL     Results for Deborah Coffey, Deborah Coffey (MRN [id number]) as of 01/02/2014 12:41  Ref. Range 01/02/2014 07:45 01/02/2014 11:42  Glucose-Capillary Latest Range: 70-99 mg/dL 223 (H) 328 (H)    Results for Deborah Coffey, Deborah Coffey (MRN [id number]) as of 01/02/2014 12:41  Ref. Range 01/01/2014 07:25  Hemoglobin A1C Latest Range: <5.7 % 13.1 (H)     Spoke with Dr. Sherrine Maples today.  Note plans to send patient home on insulin since her A1c is elevated.  Spoke with care manager today.  Per care management, patient can use her orange card at the [facility] to pick up her insulin Rxs after discharge.  I called the pharmacy at the [facility] clinic yesterday, and per the person I spoke with, patient can get Lantus or Toujeo insulin pens (for basal insulin) and Novolog insulin pens for rapid-acting insulin coverage.  Patient can also get Novolog 70/30 Mix insulin pens at the Wellness clinic as well.    Not sure what the best plan is for discharge (basal-bolus regimen, basal + Metformin, or 70/30 insulin bid with meals).  Bid dosing of 70/30 insulin would be the easiest option for the patient or Basal insulin + Metformin would also be a simple option.  Instructed patient to go to the [facility] immediately after discharge to pick up her new insulin Rxs.  Patient stated she understood.  Re-educated patient on insulin pen use at home.  Reviewed all steps if insulin pen including attachment of needle, 2-unit air shot, dialing up dose, giving injection, removing needle, disposal of sharps, storage of unused insulin, disposal of insulin etc.  Patient able to provide successful return demonstration.  Also  reviewed troubleshooting with insulin pen.  MD to give patient Rxs for insulin pens and insulin pen needles.    MD- Please make sure to give patient prescriptions for insulin pens and insulin pen needles at time of discharge Here are the following Order #'s for ease of ordering: Lantus, Toujeo, Novolog, and Novolog 70/30 insulins (whichever insulins you choose)  Lantus Solostar [Order # [id number]] Deborah Coffey [Order # [id number]] Deborah Coffey [Order # [id number] Novolog 70/30 Mix [Order # [id number] Insulin Pen needles 31 gauge x 36mm [Order # [id number]    Will follow Deborah Quaker RN, MSN, CDE Diabetes Coordinator Inpatient Diabetes Program Team Pager: [phone] (8a-10p)

## 2014-01-02 NOTE — Progress Notes (Signed)
At about 0300 pt c/o legs aching. States aching worse than they do at home and requesting pain medication. Pt without pain meds ordered. MD on call notified and orders placed. Pt also having intermittent lt lip twisting/ droop while talking to the RN. MD also made aware of these findings. Will cont to monitor.

## 2014-01-02 NOTE — Progress Notes (Signed)
EEG Completed; Results Pending  

## 2014-01-02 NOTE — Evaluation (Signed)
Physical Therapy Evaluation Patient Details Name: Deborah Coffey MRN: [id number] DOB: 1956-03-01 Today's Date: 01/02/2014   History of Present Illness  In brief, 58 y/o female with PMH of HTN, HLD, DM, CAD s/p CABG p/w slurred speech, L facial droop * 1 day. Pt states that she felt well last night but when she woke up this AM she noted she had a left sided facial droop. She talked to her daughter who noted slurred speech and EMS was called for possible stroke. Pt states symptoms resolved in ambukance but she has has 2 more episodes since then each lasting approx 5-10 min while the first one lasted approx 20 min. Pt also c/o frontal HA this AM. No CP, no sob, no palpitations, no diaphoresis, no blurry vision, no focal weakness, no tingling/numbness. Did c/o altered sensation on the left side of her face. Remaining ROS negative. MRI neg for acute events  Clinical Impression  Pt presently at mod I level with mobility. Has pre-existing weakness and pain bilateral LE's from chronic back pain but no change in status with this. She reports that she feels more fatigued than normal with mobility but no LOB or overt dysfunction. No dizziness or facial drooping during evaluation. No further acute PT needs at this time, PT signing off.    Follow Up Recommendations No PT follow up    Equipment Recommendations  None recommended by PT    Recommendations for Other Services       Precautions / Restrictions Precautions Precautions: Fall Precaution Comments: pt with h/o chronic back issues with LE pain and weakness Restrictions Weight Bearing Restrictions: No      Mobility  Bed Mobility Overal bed mobility: Modified Independent                Transfers Overall transfer level: Modified independent Equipment used: None                Ambulation/Gait Ambulation/Gait assistance: Modified independent (Device/Increase time) Ambulation Distance (Feet): 200 Feet Assistive device:  None Gait Pattern/deviations: Step-through pattern;Wide base of support Gait velocity: decreased Gait velocity interpretation: Below normal speed for age/gender General Gait Details: pt ambulates with decreased pace and widened BOS with increased sway due to back issues. She reports that she feels weak all over from being in bed past 2 days and her legs hurt more than normal. No asymmetrical deviations or LOB. Instructed pt to begin increasing mobility back to normal LOF to manage back pain.  Stairs            Wheelchair Mobility    Modified Rankin (Stroke Patients Only) Modified Rankin (Stroke Patients Only) Pre-Morbid Rankin Score: No symptoms Modified Rankin: No symptoms     Balance Overall balance assessment: Modified Independent                                           Pertinent Vitals/Pain Pain Assessment: No/denies pain BP supine 124/60    Sitting   136/76    Standing  136/78    Home Living Family/patient expects to be discharged to:: Private residence Living Arrangements: Children;Other relatives Available Help at Discharge: Family;Available PRN/intermittently Type of Home: Apartment Home Access: Level entry     Home Layout: One level Home Equipment: None Additional Comments: 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)    Prior Function Level of  Independence: Independent               Hand Dominance        Extremity/Trunk Assessment   Upper Extremity Assessment: Defer to OT evaluation           Lower Extremity Assessment: RLE deficits/detail;LLE deficits/detail RLE Deficits / Details: pt has pain down posterior bilateral legs from back, hip flex 4/5, knee ext 4/5, knee flex 4/5 LLE Deficits / Details: same as RLE  Cervical / Trunk Assessment: Normal  Communication   Communication: No difficulties  Cognition Arousal/Alertness: Awake/alert Behavior During Therapy: WFL for tasks  assessed/performed Overall Cognitive Status: Within Functional Limits for tasks assessed                      General Comments General comments (skin integrity, edema, etc.): pt had no symptoms on eval, however, about 10 mins before eval, when physician was with her, she had another episode of left facial droop which lasted apprx 1.5 mins and resolved. She reports that she feels overall more fatigued than her usual self.     Exercises        Assessment/Plan    PT Assessment Patent does not need any further PT services  PT Diagnosis     PT Problem List    PT Treatment Interventions     PT Goals (Current goals can be found in the Care Plan section) Acute Rehab PT Goals Patient Stated Goal: figure out what is going on' PT Goal Formulation: No goals set, d/c therapy    Frequency     Barriers to discharge        Co-evaluation               End of Session Equipment Utilized During Treatment: Gait belt Activity Tolerance: Patient tolerated treatment well Patient left: in chair;with call bell/phone within reach;with nursing/sitter in room Nurse Communication: Mobility status    Functional Assessment Tool Used: clincal judgement Functional Limitation: Mobility: Walking and moving around Mobility: Walking and Moving Around Current Status [phone]): 0 percent impaired, limited or restricted Mobility: Walking and Moving Around Goal Status [phone]): 0 percent impaired, limited or restricted Mobility: Walking and Moving Around Discharge Status [phone]): 0 percent impaired, limited or restricted    Time: 1022-1048 PT Time Calculation (min): 26 min   Charges:   PT Evaluation $Initial PT Evaluation Tier I: 1 Procedure PT Treatments $Gait Training: 8-22 mins $Therapeutic Activity: 8-22 mins   PT G Codes:   Functional Assessment Tool Used: clincal judgement Functional Limitation: Mobility: Walking and moving around  [place], [place], [place] 01/02/2014, 11:15 AM

## 2014-01-02 NOTE — Progress Notes (Signed)
Subjective: Deborah Coffey is feeling alright this morning. She had several episodes of left facial droop overnight (admits that there may have been more than just the 3 for which she called her nurse). She has noticed that she sees dark spots before or during her drooping episodes. The spots float outward and sometimes shimmer. She has had a light headache throughout, but says it has mostly subsided. She has no history of migraine, but her daughter experiences migraines.  Patient has now had 6 episodes of "facial twisting and slurred speech" while in the hospital. These episodes have lasted for 1-7 minutes.  OF NOTE: during exam today, patient had an episode of left-sided facial droop with some slurred speech. It started about 30 seconds after the patient sat up in bed. She felt "sick" during the episode. She closed her eyes and was assisted back into the bed. The entire episode lasted for 1 minute and 20 seconds. She was responsive during it. She felt increased sensation on the left side of her face before, during and after the episode. She did not mention seeing the spots. She did not have a headache.   Objective: Vital signs in last 24 hours: Filed Vitals:   01/01/14 2255 01/02/14 0006 01/02/14 0404 01/02/14 0756  BP: 132/54 122/96 133/58 127/80  Pulse: 83 88 84 80  Temp:  98.7 F (37.1 C) 98.7 F (37.1 C) 97.9 F (36.6 C)  TempSrc:  Oral Oral Oral  Resp:    16  Height:      Weight:      SpO2: 100% 100% 99% 99%   Weight change:   Intake/Output Summary (Last 24 hours) at 01/02/14 0930 Last data filed at 01/02/14 0600  Gross per 24 hour  Intake 1338.75 ml  Output      0 ml  Net 1338.75 ml   Physical Exam: Appearance: in NAD, lying in bed talking to cafeteria worker about upcoming meal plan HEENT: AT/[place], PERRL, EOMi Heart: RRR with loud S2, no murmurs Lungs: CTAB, normal work of breathing, no wheezes Abdomen: BS+, soft, nontender Extremities: no edema  Neurologic: A&Ox3,  sensation increased in V2 and V3 on left sided; sensation normal in V1, V2, V3 on the right and V1 on the left. Sensation and strength intact throughout. FNF intact. Unable to complete 2-point discrimination exam due to onset of facial droop / feeling sick. Skin: No rashes or lesions Psychiatric: Admits to depressed mood "for a long time", denies SI/HI  Lab Results: Basic Metabolic Panel:  Recent Labs Lab 01/01/14 0725 01/01/14 0732  NA 138 139  K 4.4 4.3  CL 102 103  CO2 24  --   GLUCOSE 320* 334*  BUN 13 12  CREATININE 0.64 0.70  CALCIUM 9.3  --    Liver Function Tests:  Recent Labs Lab 01/01/14 0725  AST 14  ALT 11  ALKPHOS 100  BILITOT <0.2*  PROT 7.2  ALBUMIN 3.2*   CBC:  Recent Labs Lab 01/01/14 0725 01/01/14 0732  WBC 5.8  --   NEUTROABS 3.0  --   HGB 12.2 13.9  HCT 37.3 41.0  MCV 82.3  --   PLT 295  --    CBG:  Recent Labs Lab 01/01/14 1101 01/01/14 1717 01/01/14 2136 01/02/14 0745  GLUCAP 315* 338* 289* 223*   Hemoglobin A1C:  Recent Labs Lab 01/01/14 0725  HGBA1C 13.1*   Fasting Lipid Panel:  Recent Labs Lab 01/02/14 0002  CHOL 141  HDL 67  LDLCALC  45  TRIG 145  CHOLHDL 2.1   Coagulation:  Recent Labs Lab 01/01/14 0725  LABPROT 11.8  INR 0.87   Urine Drug Screen: Drugs of Abuse     Component Value Date/Time   LABOPIA NONE DETECTED 01/02/2014 0410   COCAINSCRNUR NONE DETECTED 01/02/2014 0410   LABBENZ NONE DETECTED 01/02/2014 0410   AMPHETMU NONE DETECTED 01/02/2014 0410   THCU NONE DETECTED 01/02/2014 0410   LABBARB NONE DETECTED 01/02/2014 0410    Alcohol Level:  Recent Labs Lab 01/01/14 0725  ETH <11   Urinalysis:  Recent Labs Lab 01/02/14 0410  COLORURINE YELLOW  LABSPEC 1.021  PHURINE 6.5  GLUCOSEU >1000*  HGBUR NEGATIVE  BILIRUBINUR NEGATIVE  KETONESUR NEGATIVE  PROTEINUR NEGATIVE  UROBILINOGEN 0.2  NITRITE NEGATIVE  LEUKOCYTESUR NEGATIVE   Studies/Results: Ct Head Wo Contrast  01/01/2014    CLINICAL DATA:  Left facial droop and slurred speech  EXAM: CT HEAD WITHOUT CONTRAST  TECHNIQUE: Contiguous axial images were obtained from the base of the skull through the vertex without intravenous contrast.  COMPARISON:  Aug 24, 2009  FINDINGS: The ventricles are normal in size and configuration. There is no mass, hemorrhage, extra-axial fluid collection, or midline shift. There is mild small vessel disease in the centra semiovale bilaterally. Elsewhere gray-white compartments appear normal. There is no demonstrable acute infarct. The bony calvarium appears intact. The mastoid air cells are clear. There is mucosal thickening in the right maxillary antrum.  IMPRESSION: Mild periventricular small vessel disease. There is no intracranial mass, hemorrhage, or acute appearing infarct. There is right maxillary sinus disease.   Electronically Signed   By: Lowella Grip M.D.   On: 01/01/2014 07:45   Mr Jodene Nam Head Wo Contrast  01/01/2014   CLINICAL DATA:  Stroke.  Dysarthria with left facial droop  EXAM: MRI HEAD WITHOUT CONTRAST  MRA HEAD WITHOUT CONTRAST  TECHNIQUE: Multiplanar, multiecho pulse sequences of the brain and surrounding structures were obtained without intravenous contrast. Angiographic images of the head were obtained using MRA technique without contrast.  COMPARISON:  CT head 01/01/2014  FINDINGS: MRI HEAD FINDINGS  Negative for acute infarct.  Small white matter hyperintensities bilaterally in the subcortical and deep white matter. Brainstem and cerebellum are normal.  Focal micro hemorrhage right frontal lobe. No other hemorrhage or fluid collection.  Negative for mass or edema.  Pituitary normal in size.  Ventricle size is normal.  Mucosal edema right maxillary sinus.  MRA HEAD FINDINGS  Both vertebral arteries are patent to the basilar. PICA patent basilar widely patent. Superior cerebellar and posterior cerebral arteries are patent.  Internal carotid artery patent bilaterally. Anterior and  middle cerebral arteries are patent bilaterally.  Negative for cerebral aneurysm.  IMPRESSION: Negative for acute infarct  Mild chronic microvascular ischemic change in the white matter.  Negative MRA head   Electronically Signed   By: Franchot Gallo M.D.   On: 01/01/2014 12:59   Mr Brain Wo Contrast  01/01/2014   CLINICAL DATA:  Stroke.  Dysarthria with left facial droop  EXAM: MRI HEAD WITHOUT CONTRAST  MRA HEAD WITHOUT CONTRAST  TECHNIQUE: Multiplanar, multiecho pulse sequences of the brain and surrounding structures were obtained without intravenous contrast. Angiographic images of the head were obtained using MRA technique without contrast.  COMPARISON:  CT head 01/01/2014  FINDINGS: MRI HEAD FINDINGS  Negative for acute infarct.  Small white matter hyperintensities bilaterally in the subcortical and deep white matter. Brainstem and cerebellum are normal.  Focal micro hemorrhage right  frontal lobe. No other hemorrhage or fluid collection.  Negative for mass or edema.  Pituitary normal in size.  Ventricle size is normal.  Mucosal edema right maxillary sinus.  MRA HEAD FINDINGS  Both vertebral arteries are patent to the basilar. PICA patent basilar widely patent. Superior cerebellar and posterior cerebral arteries are patent.  Internal carotid artery patent bilaterally. Anterior and middle cerebral arteries are patent bilaterally.  Negative for cerebral aneurysm.  IMPRESSION: Negative for acute infarct  Mild chronic microvascular ischemic change in the white matter.  Negative MRA head   Electronically Signed   By: Franchot Gallo M.D.   On: 01/01/2014 12:59   Medications: I have reviewed the patient's current medications. Scheduled Meds: . benzonatate  200 mg Oral TID  . gabapentin  100 mg Oral Daily  . insulin aspart  0-9 Units Subcutaneous TID WC  . insulin glargine  15 Units Subcutaneous Daily  . LORazepam  0.5-1 mg Intravenous Once  . nicotine  21 mg Transdermal Daily  . pantoprazole  40 mg Oral  Daily  . research study medication  300 mg Oral Once  . research study medication  100 mg Oral Daily  . research study medication  180 mg Oral Once  . research study medication  90 mg Oral BID   Continuous Infusions: . sodium chloride 75 mL/hr at 01/01/14 1345   PRN Meds:.nitroGLYCERIN, senna-docusate Assessment/Plan: Active Problems:   URI, acute   Essential hypertension, benign   CAD (coronary artery disease) - CABG x 1 (LIMA-LAD)   DM (diabetes mellitus) type 2, uncontrolled, with ketoacidosis   Tobacco use   Stroke  This is a 59 y.o. female with a past medical history significant for HTN, hyperlipidemia, DM, CAD s/p CABG 2005, who presented on 01/01/14 with dysarthria, and left facial weakness that was noticed on the morning of admission. She has had an MRI/MRA negative for acute infarct but positive for chronic microvascular ischemic changes in the white matter. Her echocardiogram was also negative for major pathology, with an EF of 55-65%. However, her HgbA1c of 13.1%, hypertension, previous hyperlipidemia and history of cigarette smoking, along with her recurrent episodes of facial weakness on this admission are concerning for ongoing TIA. This could be small vessel disease, vasospasm, or carotid hypoperfusion. This could also be a seizure or migraine.   Recurring Facial Droop and Change in V2/V3 Sensation: Presentation is concerning for ongoing, recurrent TIA. 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 Also concerning for carotid hypoperfusion, which will be investigated by dopplers today. Also concerning for seizure activity, migraine (though this is less likely given her associate facial weakness), and psychogenic causes (patient admits to recent stressors and depression). - Appreciate Neurology consult - EEG pending - Continue telemetry  - Carotid dopplers today - Continue aspirin 81 mg daily  -  Start low dose statin (10 mg lipitor) in acute setting (despite low LDL) - Risk factor modification   - Frequent neuro checks  - Appreciate PT/OT SLP evaluations (PT signing off)  Essential Hypertension: BP currently stable. Home medication include hydrochlorothiazide 12.5 mg daily, lisinopril, 10 mg daily, and metoprolol, 50 mg twice a day.  - Holding antihypertensives for 48 hours for permissive hypertension (to be reinitiated tomorrow morning)  Uncontrol type 2 diabetes: A1c 06/20/2013 was 10.5% --> now 13.1%. Home medication include metformin 1000 mg twice a day, and Farxiga 10 mg daily. Questionable compliance.  -  Hold home hypoglycemics  - CBGs a.c. each bedtime  - Lantus with SSI  - Carb modified diet - Counseling for outpatient control   CAD: Status post CABG x1 in 2005. No active symptoms of cardiac ischemia. Initial EKG unremarkable. Troponins, negative X1.  - Continue tele   Acute Bronchitis: Patient reports ongoing cough for about 1-1/2 weeks without fevers, chills, or other constitutional symptoms. Lung exam without abnormalities. No longer complaining of cough today.   Smoking: She smokes half pack a day. Counseling. Provided a nicotine patch   Code: Full   VTE PPx: SCDs   F/E/N: Carb mod diet, NSL. Lytes are normal  Dispo: Disposition is deferred at this time, awaiting improvement of current medical problems.  Anticipated discharge in approximately 2 day(s).   The patient does have a current PCP (Tresa Garter, MD) and does not need an [facility] hospital follow-up appointment after discharge.  The patient does not have transportation limitations that hinder transportation to clinic appointments.  .Services Needed at time of discharge: Y = Yes, Blank = No PT:   OT:   RN:   Equipment:   Other:     LOS: 1 day   Drucilla Schmidt, MD 01/02/2014, 9:30 AM

## 2014-01-02 NOTE — Care Management Note (Addendum)
    Page 1 of 1   01/03/2014     4:13:18 PM CARE MANAGEMENT NOTE 01/03/2014  Patient:  [place]ton,Rosalin   Account Number:  [id number]  Date Initiated:  01/02/2014  Documentation initiated by:  GRAVES-BIGELOW,Aizah Gehlhausen  Subjective/Objective Assessment:   Pt admitted for slurred speech and facial droop. Pt has PCP at the [facility].     Action/Plan:   CM did call the [facility] at 1040 this am to verify if pt had orange card for meds. Pt does have orange card, however staff unable to verify if active. If inactive pt will still be able to get Rx's per pharmacy.   Anticipated DC Date:  01/03/2014   Anticipated DC Plan:  [place]  CM consult  Medication Assistance      Choice offered to / List presented to:             Status of service:  Completed, signed off Medicare Important Message given?  NO (If response is "NO", the following Medicare IM given date fields will be blank) Date Medicare IM given:   Medicare IM given by:   Date Additional Medicare IM given:   Additional Medicare IM given by:    Discharge Disposition:  HOME/SELF CARE  Per UR Regulation:  Reviewed for med. necessity/level of care/duration of stay  If discussed at [place] of Stay Meetings, dates discussed:    Comments:  01-03-14 [place], RN,BSN [phone] CM did call the [facility] in ref to insulin regimen. 70/30 insulin pens not available at the [facility]. CM did ask for lantus pens and not in stock. Toujeo is available and pharmacist recommends that for night dose and novolog for day use. MD to call [facility] in ref to Rx and pt will pick up medications. Pt to be d/c today. No further needs from CM at this time.   01-02-14 [place], RN, BSN [phone] Pt will need Rx's electronically sent to pharmacy at Clinic and once pt d/c she should go straight to [facility] to pick up meds. The Diabetes Coordinator did consult with pt in  ref to medication regimen.

## 2014-01-03 DIAGNOSIS — F449 Dissociative and conversion disorder, unspecified: Principal | ICD-10-CM

## 2014-01-03 DIAGNOSIS — I2510 Atherosclerotic heart disease of native coronary artery without angina pectoris: Secondary | ICD-10-CM

## 2014-01-03 LAB — GLUCOSE, CAPILLARY
GLUCOSE-CAPILLARY: 190 mg/dL — AB (ref 70–99)
Glucose-Capillary: 293 mg/dL — ABNORMAL HIGH (ref 70–99)

## 2014-01-03 MED ORDER — "PEN NEEDLES 3/16"" 31G X 5 MM MISC": 15.0000 [IU] | Freq: Two times a day (BID) | Status: DC

## 2014-01-03 MED ORDER — NICOTINE 21 MG/24HR TD PT24: 21.0000 mg | MEDICATED_PATCH | Freq: Every day | TRANSDERMAL | Status: DC

## 2014-01-03 MED ORDER — CITALOPRAM HYDROBROMIDE 10 MG PO TABS: 10.0000 mg | ORAL_TABLET | Freq: Every day | ORAL | Status: DC

## 2014-01-03 MED ORDER — INSULIN GLARGINE 300 UNIT/ML ~~LOC~~ SOPN: 16.0000 [IU] | PEN_INJECTOR | Freq: Every day | SUBCUTANEOUS | Status: DC

## 2014-01-03 MED ORDER — INSULIN ISOPHANE & REGULAR (HUMAN 70-30)100 UNIT/ML KWIKPEN: 8.0000 [IU] | PEN_INJECTOR | Freq: Two times a day (BID) | SUBCUTANEOUS | Status: DC

## 2014-01-03 NOTE — Evaluation (Signed)
Occupational Therapy Evaluation Patient Details Name: Deborah Coffey MRN: [id number] DOB: 24-May-1955 Today's Date: 01/03/2014    History of Present Illness In brief, 58 y/o female with PMH of HTN, HLD, DM, CAD s/p CABG p/w slurred speech, L facial droop * 1 day. Pt states that she felt well last night but when she woke up this AM she noted she had a left sided facial droop. She talked to her daughter who noted slurred speech and EMS was called for possible stroke. Pt states symptoms resolved in ambukance but she has has 2 more episodes since then each lasting approx 5-10 min while the first one lasted approx 20 min. Pt also c/o frontal HA this AM. No CP, no sob, no palpitations, no diaphoresis, no blurry vision, no focal weakness, no tingling/numbness. Did c/o altered sensation on the left side of her face. Remaining ROS negative. MRI neg for acute events   Clinical Impression   Patient admitted with above. Patient indepenent PTA. Patient currently functioning at an independent level. No further OT acute needs, OT signing off on patient at this time. Patient with no apparent left facial droop during OT eval/treat. Patient also with no left sided weakness noted. BUE MMT=4/5 bilaterally; WFL. Discussed safety throughout home environment and using/purchasing shower chair if/as needed. Patient plans to discharge > home with daughter and grandchildren. Patient aware of no recommended OT follow-up/needs at this time.     Follow Up Recommendations  No OT follow up    Equipment Recommendations  None recommended by OT (discussed shower chair if needed at home, none recommended)    Recommendations for Other Services  (none at this time)     Precautions / Restrictions Precautions Precautions: Fall Precaution Comments: pt with h/o chronic back issues with LE pain and weakness Restrictions Weight Bearing Restrictions: No     Mobility Bed Mobility Overal bed mobility:  Independent  Transfers Overall transfer level: Independent Equipment used: None     Balance Overall balance assessment: Independent     ADL Overall ADL's : Independent   General ADL Comments: Patient functioning at an overall independent level for BADLs at this time. Patient states she plans to d/c >home with her daughter and 3 grandchildren     Vision  No change from baseline, no apparent deficits          Pertinent Vitals/Pain Pain Assessment: No/denies pain     Hand Dominance Right   Extremity/Trunk Assessment Upper Extremity Assessment Upper Extremity Assessment: Overall WFL for tasks assessed   Lower Extremity Assessment Lower Extremity Assessment: Defer to PT evaluation   Cervical / Trunk Assessment Cervical / Trunk Assessment: Normal   Communication Communication Communication: No difficulties   Cognition Arousal/Alertness: Awake/alert Behavior During Therapy: WFL for tasks assessed/performed Overall Cognitive Status: Within Functional Limits for tasks assessed             Home Living Family/patient expects to be discharged to:: Private residence Living Arrangements: Children;Other relatives Available Help at Discharge: Family;Available PRN/intermittently Type of Home: House Home Access: Stairs to enter (1) Technical brewer of Steps: 1   Home Layout: One level     Bathroom Shower/Tub: Tub/shower unit;Curtain Shower/tub characteristics: Architectural technologist: Standard Bathroom Accessibility: Yes   Home Equipment: None   Additional Comments: 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)      Prior Functioning/Environment Level of Independence: Independent     OT Diagnosis:  n/a   OT Problem List:  n/a   OT Treatment/Interventions:  n/a   OT Goals(Current goals can be found in the care plan section) Acute Rehab OT Goals Patient Stated Goal:  (none stated) OT Goal Formulation:  (n/a) Time For Goal  Achievement:  (n/a) Potential to Achieve Goals:  (n/a)  OT Frequency:  n/a   Barriers to D/C:  none known at this time          End of Session Nurse Communication:  (patient's wish to shower today)  Activity Tolerance: Patient tolerated treatment well Patient left: in bed;with call bell/phone within reach;with nursing/sitter in room   Time: 1140-1200 OT Time Calculation (min): 20 min Charges:  OT General Charges $OT Visit: 1 Procedure OT Evaluation $Initial OT Evaluation Tier I: 1 Procedure OT Treatments $Self Care/Home Management : 8-22 mins Keysi Oelkers , MS, OTR/L, CLT 01/03/2014, 12:03 PM

## 2014-01-03 NOTE — Progress Notes (Signed)
STROKE TEAM PROGRESS NOTE   HISTORY Deborah Coffey is an 58 y.o. female with a past medical history significant for HTN, hyperlipidemia, DM, CAD s/p CABG, reportedly non adherent to treatment, comes in for further evaluation of the above stated symptoms.  She lives with her daughter who told me that she last saw her normal last night around 71 30. Them she woke up this morning, went to the bathroom and she look to the mirror realized that the left side of her face was droopy. Talked to her daughter and also noted slurred speech.  Said that she had a slight HA but denies focal weakness-numbness, vertigo, double vision, confusion, imbalance, difficulty swallowing, visual disturbances, CP, SOB, or palpitations.  ER staff reports fluctuating dysarthria and left face weakness since arrival to the ED. At the time of my assessment patient only had subtle left face weakness and decreased sensation left face (NIHSS 2).  CT brain showed no acute intracranial abnormality.  On aspirin  Date last known well: 12/31/13  Time last known well: 1130 pm  tPA Given: no, late presentation, mild deficits.  NIHSS: 2  SUBJECTIVE (INTERVAL HISTORY) No one is at the bedside.  According to IM resident that during their exam, pt had another event of left facial droop. Took 3 pictures for reference. I looked at those pictures, and found that she has only lower lip towards left and upper lip is in normal position. In addition, with facial movement, she has symmetrical facial movement. It also happened twice with rounding. I am not convinced this is a TIA event. In stead, it looks to me a functional condition.  OBJECTIVE Temp:  [97.9 F (36.6 C)-98.2 F (36.8 C)] 98.2 F (36.8 C) (09/17 1159) Pulse Rate:  [76-88] 82 (09/17 1159) Cardiac Rhythm:  [-] Normal sinus rhythm (09/17 0816) Resp:  [16-18] 18 (09/17 1159) BP: (118-147)/(69-83) 145/83 mmHg (09/17 1159) SpO2:  [99 %-100 %] 100 % (09/17 1159)   Recent Labs Lab  01/02/14 1527 01/02/14 1645 01/02/14 2058 01/03/14 0746 01/03/14 1156  GLUCAP 321* 299* 160* 190* 293*    Recent Labs Lab 01/01/14 0725 01/01/14 0732  NA 138 139  K 4.4 4.3  CL 102 103  CO2 24  --   GLUCOSE 320* 334*  BUN 13 12  CREATININE 0.64 0.70  CALCIUM 9.3  --     Recent Labs Lab 01/01/14 0725  AST 14  ALT 11  ALKPHOS 100  BILITOT <0.2*  PROT 7.2  ALBUMIN 3.2*    Recent Labs Lab 01/01/14 0725 01/01/14 0732  WBC 5.8  --   NEUTROABS 3.0  --   HGB 12.2 13.9  HCT 37.3 41.0  MCV 82.3  --   PLT 295  --    No results found for this basename: CKTOTAL, CKMB, CKMBINDEX, TROPONINI,  in the last 168 hours  Recent Labs  01/01/14 0725  LABPROT 11.8  INR 0.87    Recent Labs  01/02/14 0410  COLORURINE YELLOW  LABSPEC 1.021  PHURINE 6.5  GLUCOSEU >1000*  HGBUR NEGATIVE  BILIRUBINUR NEGATIVE  KETONESUR NEGATIVE  PROTEINUR NEGATIVE  UROBILINOGEN 0.2  NITRITE NEGATIVE  LEUKOCYTESUR NEGATIVE       Component Value Date/Time   CHOL 141 01/02/2014 0002   TRIG 145 01/02/2014 0002   HDL 67 01/02/2014 0002   CHOLHDL 2.1 01/02/2014 0002   VLDL 29 01/02/2014 0002   LDLCALC 45 01/02/2014 0002   Lab Results  Component Value Date   HGBA1C 12.9* 01/02/2014  Component Value Date/Time   LABOPIA NONE DETECTED 01/02/2014 0410   COCAINSCRNUR NONE DETECTED 01/02/2014 0410   LABBENZ NONE DETECTED 01/02/2014 0410   AMPHETMU NONE DETECTED 01/02/2014 0410   THCU NONE DETECTED 01/02/2014 0410   LABBARB NONE DETECTED 01/02/2014 0410     Recent Labs Lab 01/01/14 0725  ETH <11    Ct Head Wo Contrast  01/01/2014   IMPRESSION: Mild periventricular small vessel disease. There is no intracranial mass, hemorrhage, or acute appearing infarct. There is right maxillary sinus disease.     Mri and Mra Head Wo Contrast  01/01/2014   IMPRESSION: Negative for acute infarct  Mild chronic microvascular ischemic change in the white matter.  Negative MRA head      2D echo - Left  ventricle: The cavity size was normal. Wall thickness was normal. Systolic function was normal. The estimated ejection fraction was in the range of 55% to 65%. Wall motion was normal; there were no regional wall motion abnormalities. - Right atrium: The atrium was mildly dilated.  CUS - Bilateral: 1-39% ICA stenosis. Vertebral artery flow is antegrade.  EEG - normal EEG   PHYSICAL EXAM  Temp:  [97.9 F (36.6 C)-98.2 F (36.8 C)] 98.2 F (36.8 C) (09/17 1159) Pulse Rate:  [76-88] 82 (09/17 1159) Resp:  [16-18] 18 (09/17 1159) BP: (118-147)/(69-83) 145/83 mmHg (09/17 1159) SpO2:  [99 %-100 %] 100 % (09/17 1159)  General - Well nourished, well developed, in no apparent distress but mildly anxious.  Ophthalmologic - Sharp disc margins OU.  Cardiovascular - Regular rate and rhythm with no murmur.  Mental Status -  Level of arousal and orientation to time, place, and person were intact. Language including expression, naming, repetition, comprehension was assessed and found intact.  Cranial Nerves II - XII - II - Visual field intact OU. III, IV, VI - Extraocular movements intact. V - Facial sensation intact bilaterally. VII - Facial movement intact bilaterally. VIII - Hearing & vestibular intact bilaterally. X - Palate elevates symmetrically. XI - Chin turning & shoulder shrug intact bilaterally. XII - Tongue protrusion intact.  Motor Strength - The patient's strength was normal in all extremities and pronator drift was absent.  Bulk was normal and fasciculations were absent.   Motor Tone - Muscle tone was assessed at the neck and appendages and was normal.  Reflexes - The patient's reflexes were normal in all extremities and she had no pathological reflexes.  Sensory - Light touch, temperature/pinprick were assessed and were normal.    Coordination - The patient had normal movements in the hands and feet with no ataxia or dysmetria.  Tremor was absent.  Gait and Station -  The patient's transfers, posture, gait, station, and turns were observed as normal.   ASSESSMENT/PLAN  Ms. Deborah Coffey is a 58 y.o. female with history of HTN, DM, current smoker presenting with transient left facial droop and slurry speech. She did not receive IV t-PA due to resolving symptoms. Imaging showed no acute infarct.   TIA:  likely thrombotic secondary to small vessel disease     aspirin 81 mg orally every day prior to admission, now on [facility] clinical trails  MRI  No acute infarct  MRA  negative  Carotid Doppler  unremarkable  2D Echo  unremarkable   LDL 45, on lipitor 10mg  due to recurrent symptoms.  HgbA1c 12.9-13.1, indicating DM not in control  SCDs for VTE prophylaxis  Activity as tolerated  Pt had recurrent symptoms, most likely functional  component,.  Diabetes  Home meds:  metformine  HgbA1c 13.1   Uncontrolled  Goal < 7.0  On insulin now  educated patient about lifestyle changes for diabetes treatment  Hypertension   Home meds:  Lisinopri, metoprolol, HCTZ. Not resumed in hospital for permissive hypertension  BP  108-154 past 24h (01/03/2014 @ 11:54 PM)  SBP goal permissive hypertension for the first 24-48 hours and then gradually normalized in 5-7 days.  Stable  Patient counseled to be compliant with her blood pressure medications  Hyperlipidemia  Home meds:  pravastatin. Changed in hospital low dose lipitor due to recurrent symptoms  LDL 45  LDL goal <70 for diabetics  Smoker - smoking cessation counseling provided - nicotine patch applied.  Other Stroke Risk Factors   Obesity, Body mass index is 36.27 kg/(m^2).   Hospital day # 2  Neurology will sign off for now. Please call with further questions. Thank you for the consult  Rosalin Hawking, MD PhD Stroke Neurology 01/03/2014 11:54 PM      To contact Stroke Continuity provider, please refer to [email or web address] After hours, contact General Neurology

## 2014-01-03 NOTE — Discharge Instructions (Signed)
Use Nicotine patches for the next week. You can follow up with your primary doctor if you need more.  Call [facility] (on the paper you received) for counseling and therapy services.  Diabetes and Exercise Exercising regularly is important. It is not just about losing weight. It has many health benefits, such as:  Improving your overall fitness, flexibility, and endurance.  Increasing your bone density.  Helping with weight control.  Decreasing your body fat.  Increasing your muscle strength.  Reducing stress and tension.  Improving your overall health. People with diabetes who exercise gain additional benefits because exercise:  Reduces appetite.  Improves the body's use of blood sugar (glucose).  Helps lower or control blood glucose.  Decreases blood pressure.  Helps control blood lipids (such as cholesterol and triglycerides).  Improves the body's use of the hormone insulin by:  Increasing the body's insulin sensitivity.  Reducing the body's insulin needs.  Decreases the risk for heart disease because exercising:  Lowers cholesterol and triglycerides levels.  Increases the levels of good cholesterol (such as high-density lipoproteins [HDL]) in the body.  Lowers blood glucose levels. YOUR ACTIVITY PLAN  Choose an activity that you enjoy and set realistic goals. Your health care provider or diabetes educator can help you make an activity plan that works for you. Exercise regularly as directed by your health care provider. This includes:  Performing resistance training twice a week such as push-ups, sit-ups, lifting weights, or using resistance bands.  Performing 150 minutes of cardio exercises each week such as walking, running, or playing sports.  Staying active and spending no more than 90 minutes at one time being inactive. Even short bursts of exercise are good for you. Three 10-minute sessions spread throughout the day are just as beneficial as a single  30-minute session. Some exercise ideas include:  Taking the dog for a walk.  Taking the stairs instead of the elevator.  Dancing to your favorite song.  Doing an exercise video.  Doing your favorite exercise with a friend.   RECOMMENDATIONS FOR EXERCISING WITH TYPE 1 OR TYPE 2 DIABETES   Check your blood glucose before exercising. If blood glucose levels are greater than 240 mg/dL, check for urine ketones. Do not exercise if ketones are present.  Avoid injecting insulin into areas of the body that are going to be exercised. For example, avoid injecting insulin into:  The arms when playing tennis.  The legs when jogging.  Keep a record of:  Food intake before and after you exercise.  Expected peak times of insulin action.  Blood glucose levels before and after you exercise.  The type and amount of exercise you have done.  Review your records with your health care provider. Your health care provider will help you to develop guidelines for adjusting food intake and insulin amounts before and after exercising.  If you take insulin or oral hypoglycemic agents, watch for signs and symptoms of hypoglycemia. They include:  Dizziness.  Shaking.  Sweating.  Chills.  Confusion.  Drink plenty of water while you exercise to prevent dehydration or heat stroke. Body water is lost during exercise and must be replaced.  Talk to your health care provider before starting an exercise program to make sure it is safe for you. Remember, almost any type of activity is better than none. Document Released: 06/26/2003 Document Revised: 08/20/2013 Document Reviewed: 09/12/2012 [facility] Patient Information 2015 [place], [place]. This information is not intended to replace advice given to you by your health care  provider. Make sure you discuss any questions you have with your health care provider.  Nicotine Addiction Nicotine can act as both a stimulant (excites/activates) and a sedative  (calms/quiets). Immediately after exposure to nicotine, there is a "kick" caused in part by the drug's stimulation of the adrenal glands and resulting discharge of adrenaline (epinephrine). The rush of adrenaline stimulates the body and causes a sudden release of sugar. This means that smokers are always slightly hyperglycemic. Hyperglycemic means that the blood sugar is high, just like in diabetics. Nicotine also decreases the amount of insulin which helps control sugar levels in the body. There is an increase in blood pressure, breathing, and the rate of heart beats.  In addition, nicotine indirectly causes a release of dopamine in the brain that controls pleasure and motivation. A similar reaction is seen with other drugs of abuse, such as cocaine and heroin. This dopamine release is thought to cause the pleasurable sensations when smoking. In some different cases, nicotine can also create a calming effect, depending on sensitivity of the smoker's nervous system and the dose of nicotine taken. WHAT HAPPENS WHEN NICOTINE IS TAKEN FOR LONG PERIODS OF TIME?  Long-term use of nicotine results in addiction. It is difficult to stop.  Repeated use of nicotine creates tolerance. Higher doses of nicotine are needed to get the "kick." When nicotine use is stopped, withdrawal may last a month or more. Withdrawal may begin within a few hours after the last cigarette. Symptoms peak within the first few days and may lessen within a few weeks. For some people, however, symptoms may last for months or longer. Withdrawal symptoms include:   Irritability.  Craving.  Learning and attention deficits.  Sleep disturbances.  Increased appetite. Craving for tobacco may last for 6 months or longer. Many behaviors done while using nicotine can also play a part in the severity of withdrawal symptoms. For some people, the feel, smell, and sight of a cigarette and the ritual of obtaining, handling, lighting, and smoking the  cigarette are closely linked with the pleasure of smoking. When stopped, they also miss the related behaviors which make the withdrawal or craving worse. While nicotine gum and patches may lessen the drug aspects of withdrawal, cravings often persist. WHAT ARE THE MEDICAL CONSEQUENCES OF NICOTINE USE?  Nicotine addiction accounts for one-third of all cancers. The top cancer caused by tobacco is lung cancer. Lung cancer is the number one cancer killer of both men and women.  Smoking is also associated with cancers of the:  Mouth.  Pharynx.  Larynx.  Esophagus.  Stomach.  Pancreas.  Cervix.  Kidney.  Ureter.  Bladder.  Smoking also causes lung diseases such as lasting (chronic) bronchitis and emphysema.  It worsens asthma in adults and children.  Smoking increases the risk of heart disease, including:  Stroke.  Heart attack.  Vascular disease.  Aneurysm.  Passive or secondary smoke can also increase medical risks including:  Asthma in children.  Sudden Infant Death Syndrome (SIDS).  Additionally, dropped cigarettes are the leading cause of residential fire fatalities.  Nicotine poisoning has been reported from accidental ingestion of tobacco products by children and pets. Death usually results in a few minutes from respiratory failure (when a person stops breathing) caused by paralysis. TREATMENT   Medication. Nicotine replacement medicines such as nicotine gum and the patch are used to stop smoking. These medicines gradually lower the dosage of nicotine in the body. These medicines do not contain the carbon monoxide and other  toxins found in tobacco smoke.  Hypnotherapy.  Relaxation therapy.  Nicotine Anonymous (a 12-step support program). Find times and locations in your local yellow pages. Document Released: 12/10/2003 Document Revised: 06/28/2011 Document Reviewed: 06/01/2013 [facility] Patient Information 2015 [place], [place]. This information is not  intended to replace advice given to you by your health care provider. Make sure you discuss any questions you have with your health care provider.   STROKE/TIA DISCHARGE INSTRUCTIONS SMOKING Cigarette smoking nearly doubles your risk of having a stroke & is the single most alterable risk factor  If you smoke or have smoked in the last 12 months, you are advised to quit smoking for your health.  Most of the excess cardiovascular risk related to smoking disappears within a year of stopping.  Ask you doctor about anti-smoking medications  [place] Quit Line: 1-800-QUIT NOW  Free Smoking Cessation Classes [phone]  CHOLESTEROL Know your levels; limit fat & cholesterol in your diet  Lipid Panel     Component Value Date/Time   CHOL 141 01/02/2014 0002   TRIG 145 01/02/2014 0002   HDL 67 01/02/2014 0002   CHOLHDL 2.1 01/02/2014 0002   VLDL 29 01/02/2014 0002   LDLCALC 45 01/02/2014 0002      Many patients benefit from treatment even if their cholesterol is at goal.  Goal: Total Cholesterol (CHOL) less than 160  Goal:  Triglycerides (TRIG) less than 150  Goal:  HDL greater than 40  Goal:  LDL (LDLCALC) less than 100   BLOOD PRESSURE American Stroke Association blood pressure target is less that 120/80 mm/Hg  Your discharge blood pressure is:  BP: 145/83 mmHg  Monitor your blood pressure  Limit your salt and alcohol intake  Many individuals will require more than one medication for high blood pressure  DIABETES (A1c is a blood sugar average for last 3 months) Goal HGBA1c is under 7% (HBGA1c is blood sugar average for last 3 months)  Diabetes: Diagnosis of diabetes:  Your A1c:12.9 %    Lab Results  Component Value Date   HGBA1C 12.9* 01/02/2014     Your HGBA1c can be lowered with medications, healthy diet, and exercise.  Check your blood sugar as directed by your physician  Call your physician if you experience unexplained or low blood sugars.  PHYSICAL ACTIVITY/REHABILITATION  Goal is 30 minutes at least 4 days per week  Activity: Increase activity slowly, Therapies:  Return to work:   Activity decreases your risk of heart attack and stroke and makes your heart stronger.  It helps control your weight and blood pressure; helps you relax and can improve your mood.  Participate in a regular exercise program.  Talk with your doctor about the best form of exercise for you (dancing, walking, swimming, cycling).  DIET/WEIGHT Goal is to maintain a healthy weight  Your discharge diet is: Carb Control  liquids Your height is:  Height: 5\' 6"  (167.6 cm) Your current weight is: Weight: 101.878 kg (224 lb 9.6 oz) Your Body Mass Index (BMI) is:  BMI (Calculated): 36.3  Following the type of diet specifically designed for you will help prevent another stroke.  Your goal weight range is:    Your goal Body Mass Index (BMI) is 19-24.  Healthy food habits can help reduce 3 risk factors for stroke:  High cholesterol, hypertension, and excess weight.  RESOURCES Stroke/Support Group:  Call [phone]   STROKE EDUCATION PROVIDED/REVIEWED AND GIVEN TO PATIENT Stroke warning signs and symptoms How to activate emergency medical  system (call 911). Medications prescribed at discharge. Need for follow-up after discharge. Personal risk factors for stroke. Pneumonia vaccine given: No Flu vaccine given: No My questions have been answered, the writing is legible, and I understand these instructions.  I will adhere to these goals & educational materials that have been provided to me after my discharge from the hospital.

## 2014-01-03 NOTE — Progress Notes (Signed)
Subjective: Deborah Coffey is ok, but has been experiencing more frequent "facial twisting episodes" this morning, ever since she got up. Denies headache.  Objective: Vital signs in last 24 hours: Filed Vitals:   01/02/14 1600 01/02/14 2000 01/03/14 0000 01/03/14 0400  BP: 132/59 149/79 133/72 118/69  Pulse: 82 88 78 76  Temp: 98.3 F (36.8 C) 98.9 F (37.2 C)  97.9 F (36.6 C)  TempSrc: Oral Oral  Oral  Resp: 16 16 16 16   Height:      Weight:      SpO2: 99% 99% 99% 100%   Weight change:   Intake/Output Summary (Last 24 hours) at 01/03/14 0741 Last data filed at 01/02/14 2150  Gross per 24 hour  Intake    345 ml  Output      0 ml  Net    345 ml   Physical Exam: Appearance: experiencing an "episode" on my arrival. Slowed, slurred speech. HEENT: AT/[place], PERRL, EOMi Heart: RRR with loud S2, no murmurs Lungs: CTAB, normal work of breathing, no wheezes Abdomen: BS+, soft, nontender Extremities: no edema  Neurologic: A&Ox3, sensation increased in V2 and V3 on left sided; sensation normal in V1, V2, V3 on the right and V1 on the left. Sensation and strength intact throughout. FNF intact. Unable to complete 2-point discrimination exam due to onset of facial droop / feeling sick. Skin: No rashes or lesions Psychiatric: Admits to depressed mood "for a long time", denies SI/HI During today's exam: (asked to raise eyebrows, smile, and relax)       Lab Results: Basic Metabolic Panel:  Recent Labs Lab 01/01/14 0725 01/01/14 0732  NA 138 139  K 4.4 4.3  CL 102 103  CO2 24  --   GLUCOSE 320* 334*  BUN 13 12  CREATININE 0.64 0.70  CALCIUM 9.3  --    Liver Function Tests:  Recent Labs Lab 01/01/14 0725  AST 14  ALT 11  ALKPHOS 100  BILITOT <0.2*  PROT 7.2  ALBUMIN 3.2*   CBC:  Recent Labs Lab 01/01/14 0725 01/01/14 0732  WBC 5.8  --   NEUTROABS 3.0  --   HGB 12.2 13.9  HCT 37.3 41.0  MCV 82.3  --   PLT 295  --    CBG:  Recent Labs Lab  01/01/14 2136 01/02/14 0745 01/02/14 1142 01/02/14 1527 01/02/14 1645 01/02/14 2058  GLUCAP 289* 223* 328* 321* 299* 160*   Hemoglobin A1C:  Recent Labs Lab 01/02/14 0002  HGBA1C 12.9*   Fasting Lipid Panel:  Recent Labs Lab 01/02/14 0002  CHOL 141  HDL 67  LDLCALC 45  TRIG 145  CHOLHDL 2.1   Coagulation:  Recent Labs Lab 01/01/14 0725  LABPROT 11.8  INR 0.87   Urine Drug Screen: Drugs of Abuse     Component Value Date/Time   LABOPIA NONE DETECTED 01/02/2014 0410   COCAINSCRNUR NONE DETECTED 01/02/2014 0410   LABBENZ NONE DETECTED 01/02/2014 0410   AMPHETMU NONE DETECTED 01/02/2014 0410   THCU NONE DETECTED 01/02/2014 0410   LABBARB NONE DETECTED 01/02/2014 0410    Alcohol Level:  Recent Labs Lab 01/01/14 0725  ETH <11   Urinalysis:  Recent Labs Lab 01/02/14 0410  COLORURINE YELLOW  LABSPEC 1.021  PHURINE 6.5  GLUCOSEU >1000*  HGBUR NEGATIVE  BILIRUBINUR NEGATIVE  KETONESUR NEGATIVE  PROTEINUR NEGATIVE  UROBILINOGEN 0.2  NITRITE NEGATIVE  LEUKOCYTESUR NEGATIVE   Studies/Results: Mr Jodene Nam Head Wo Contrast  01/01/2014   CLINICAL DATA:  Stroke.  Dysarthria with left facial droop  EXAM: MRI HEAD WITHOUT CONTRAST  MRA HEAD WITHOUT CONTRAST  TECHNIQUE: Multiplanar, multiecho pulse sequences of the brain and surrounding structures were obtained without intravenous contrast. Angiographic images of the head were obtained using MRA technique without contrast.  COMPARISON:  CT head 01/01/2014  FINDINGS: MRI HEAD FINDINGS  Negative for acute infarct.  Small white matter hyperintensities bilaterally in the subcortical and deep white matter. Brainstem and cerebellum are normal.  Focal micro hemorrhage right frontal lobe. No other hemorrhage or fluid collection.  Negative for mass or edema.  Pituitary normal in size.  Ventricle size is normal.  Mucosal edema right maxillary sinus.  MRA HEAD FINDINGS  Both vertebral arteries are patent to the basilar. PICA patent  basilar widely patent. Superior cerebellar and posterior cerebral arteries are patent.  Internal carotid artery patent bilaterally. Anterior and middle cerebral arteries are patent bilaterally.  Negative for cerebral aneurysm.  IMPRESSION: Negative for acute infarct  Mild chronic microvascular ischemic change in the white matter.  Negative MRA head   Electronically Signed   By: Franchot Gallo M.D.   On: 01/01/2014 12:59   Mr Brain Wo Contrast  01/01/2014   CLINICAL DATA:  Stroke.  Dysarthria with left facial droop  EXAM: MRI HEAD WITHOUT CONTRAST  MRA HEAD WITHOUT CONTRAST  TECHNIQUE: Multiplanar, multiecho pulse sequences of the brain and surrounding structures were obtained without intravenous contrast. Angiographic images of the head were obtained using MRA technique without contrast.  COMPARISON:  CT head 01/01/2014  FINDINGS: MRI HEAD FINDINGS  Negative for acute infarct.  Small white matter hyperintensities bilaterally in the subcortical and deep white matter. Brainstem and cerebellum are normal.  Focal micro hemorrhage right frontal lobe. No other hemorrhage or fluid collection.  Negative for mass or edema.  Pituitary normal in size.  Ventricle size is normal.  Mucosal edema right maxillary sinus.  MRA HEAD FINDINGS  Both vertebral arteries are patent to the basilar. PICA patent basilar widely patent. Superior cerebellar and posterior cerebral arteries are patent.  Internal carotid artery patent bilaterally. Anterior and middle cerebral arteries are patent bilaterally.  Negative for cerebral aneurysm.  IMPRESSION: Negative for acute infarct  Mild chronic microvascular ischemic change in the white matter.  Negative MRA head   Electronically Signed   By: Franchot Gallo M.D.   On: 01/01/2014 12:59   Medications: I have reviewed the patient's current medications. Scheduled Meds: . atorvastatin  10 mg Oral q1800  . gabapentin  100 mg Oral Daily  . insulin aspart  0-15 Units Subcutaneous TID WC  . insulin  aspart  0-5 Units Subcutaneous QHS  . insulin glargine  15 Units Subcutaneous Daily  . LORazepam  0.5-1 mg Intravenous Once  . nicotine  21 mg Transdermal Daily  . pantoprazole  40 mg Oral Daily  . research study medication  300 mg Oral Once  . research study medication  100 mg Oral Daily  . research study medication  180 mg Oral Once  . research study medication  90 mg Oral BID   Continuous Infusions:   PRN Meds:.benzonatate, nitroGLYCERIN, senna-docusate Assessment/Plan: Active Problems:   URI, acute   Essential hypertension, benign   CAD (coronary artery disease) - CABG x 1 ([facility]A-[facility]AD)   DM (diabetes mellitus) type 2, uncontrolled, with ketoacidosis   Tobacco use   Stroke   Visual changes   Facial weakness  This is a 58 y.o. female with a past medical history significant for  HTN, hyperlipidemia, DM, CAD s/p CABG 2005, who presented on 01/01/14 with dysarthria, and left facial weakness that was noticed on the morning of admission. She has had an MRI/MRA negative for acute infarct but positive for chronic microvascular ischemic changes in the white matter. Her echocardiogram was also negative for major pathology, with an EF of 55-65%. Her EEG was negative. Carotid dopplers were negative (showed 0-39% occlusion on both sides). However, her HgbA1c of 13.1%, hypertension, previous hyperlipidemia and history of cigarette smoking, along with her recurrent episodes of facial weakness on this admission are concerning for ongoing TIA. This could be small vessel disease, vasospasm, or carotid hypoperfusion. This could also be migraine.   Recurring Facial Droop and Change in V2/V3 Sensation: Presentation is concerning for ongoing, recurrent TIA. 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 She may also be experiencing migraine (though this is less likely given her associate facial weakness) or this could be psychogenic in  origin (patient admits to recent home stressors of caring for 3 grandchildren and depression). Interestingly, on the images taken today, the patient's nasolabial fold is more flat on the right, inconsistent with her picture. Also interestingly, her droop is most pronounced in the lower lip. These findings are more consistent with a conversion disorder or volitional cause than a true facial droop from TIA. - Appreciate Neurology consult and guidance - Continue aspirin 81 mg daily - Continue low dose statin (10 mg lipitor) in acute setting (despite low LDL) - Risk factor modification  (DM and smoking) - Appreciate PT/OT SLP evaluations (PT signing off) - Attempt to calm patient; possible d/c today as per neurology  Essential Hypertension: BP currently stable (118/69 to 149/79 overnight). Home medication include hydrochlorothiazide 12.5 mg daily, lisinopril, 10 mg daily, and metoprolol, 50 mg twice a day.  - Holding antihypertensives for permissive hypertension  Uncontrol type 2 diabetes: A1c 06/20/2013 was 10.5% --> now 13.1%. Home medication include metformin 1000 mg twice a day, and Farxiga 10 mg daily. Questionable compliance.  - Hold home hypoglycemics  - CBGs a.c. each bedtime  - Lantus with SSI (upgraded to moderate yesterday) - Carb modified diet - Counseling for outpatient control   CAD: Status post CABG x1 in 2005. No active symptoms of cardiac ischemia. Initial EKG unremarkable. Troponins, negative X1.  - Continue tele   Acute Bronchitis: Patient reports ongoing cough for about 1-1/2 weeks without fevers, chills, or other constitutional symptoms. Lung exam without abnormalities. No longer complaining of cough today.   Smoking: She smokes half pack a day. Counseling. Provided a nicotine patch   Code: Full   VTE PPx: SCDs   F/E/N: Carb mod diet, NSL. Lytes are normal  Dispo: Disposition is deferred at this time, awaiting improvement of current medical problems.  Anticipated  discharge in approximately 0-1 day(s).   The patient does have a current PCP (Tresa Garter, MD) and does not need an [facility] hospital follow-up appointment after discharge.  The patient does not have transportation limitations that hinder transportation to clinic appointments.  .Services Needed at time of discharge: Y = Yes, Blank = No PT:   OT:   RN:   Equipment:   Other:     LOS: 2 days   Drucilla Schmidt, MD 01/03/2014, 7:41 AM

## 2014-01-03 NOTE — Discharge Summary (Signed)
Name: Deborah Coffey MRN: [id number] DOB: 29-Nov-1955 58 y.o. PCP: Tresa Garter, MD  Date of Admission: 01/01/2014  6:58 AM Date of Discharge: 01/03/2014 Attending Physician: Aldine Contes, MD  Discharge Diagnosis: Active Problems:   Essential hypertension, benign   CAD (coronary artery disease) - CABG x 1 (LIMA-LAD)   DM (diabetes mellitus) type 2, uncontrolled, with ketoacidosis   Tobacco use   Conversion disorder   Visual changes   Facial weakness  Discharge Medications:     Medication List    STOP taking these medications       FARXIGA 10 MG Tabs  Generic drug:  Dapagliflozin Propanediol     hydrochlorothiazide 12.5 MG capsule  Commonly known as:  MICROZIDE     lisinopril 10 MG tablet  Commonly known as:  PRINIVIL,ZESTRIL      TAKE these medications       aspirin 81 MG tablet  Take 1 tablet (81 mg total) by mouth daily.     citalopram 10 MG tablet  Commonly known as:  CELEXA  Take 1 tablet (10 mg total) by mouth daily.     gabapentin 100 MG capsule  Commonly known as:  NEURONTIN  Take 1 capsule (100 mg total) by mouth daily.     Insulin Glargine 300 UNIT/ML Sopn  Commonly known as:  TOUJEO SOLOSTAR  Inject 16 Units into the skin daily with breakfast.     metFORMIN 500 MG 24 hr tablet  Commonly known as:  GLUCOPHAGE XR  Take 2 tablets (1,000 mg total) by mouth 2 (two) times daily with a meal.     metoprolol 50 MG tablet  Commonly known as:  LOPRESSOR  Take 1 tablet (50 mg total) by mouth 2 (two) times daily.     naproxen 500 MG tablet  Commonly known as:  NAPROSYN  Take 1 tablet (500 mg total) by mouth 2 (two) times daily with a meal.     nicotine 21 mg/24hr patch  Commonly known as:  NICODERM CQ  Place 1 patch (21 mg total) onto the skin daily.     nitroGLYCERIN 0.4 MG SL tablet  Commonly known as:  NITROSTAT  Place 1 tablet (0.4 mg total) under the tongue every 5 (five) minutes as needed for chest pain.     pantoprazole 40 MG  tablet  Commonly known as:  PROTONIX  Take 1 tablet (40 mg total) by mouth daily.     pravastatin 40 MG tablet  Commonly known as:  PRAVACHOL  Take 1 tablet (40 mg total) by mouth daily.     senna-docusate 8.6-50 MG per tablet  Commonly known as:  Senokot-S  Take 1 tablet by mouth at bedtime as needed for mild constipation.     traMADol 50 MG tablet  Commonly known as:  ULTRAM  Take 1 tablet (50 mg total) by mouth every 6 (six) hours as needed.        Disposition and follow-up:   Deborah Coffey was discharged from [facility] in Stable condition.  At the hospital follow up visit please address:  1.  Ms. Furgeson was admitted for evaluation of new onset left sided facial droop and slurred speech. She had recurrent episodes during her hospitalization, some of which were witnessed by MD. She was put on aspirin therapy (she is part of the SOCRATES trial) and lipitor 10mg  (despite low LDL). Her MRI brain, CT head, EEG, echocardiogram and carotid dopplers were negative. She was found to have a  HgbA1c of 13.1%. She is an active smoker. Please continue to work with her on reducing her risk factors (DMII, smoking cessation, better diet).  Diabetes management: patient was restarted on 70/30 today and sent out with needles and pen needles to last her until her appointment. Please evaluate and renew her prescriptions as needed.  BP: medications were stopped to allow for permissive HTN in the hospital; metoprolol was restarted (she was sent out on just metoprolol); please assess at hospital follow up and continue to add back other BP meds as necessary.  An SSRI was started given the patient's teary periods and long-standing depressed mood; she was also given information on [facility] for counseling/therapy. Please follow as needed.  2.  Labs / imaging needed at time of follow-up: A1c  3.  Pending labs/ test needing follow-up: none  Follow-up Appointments:     Follow-up  Information   Follow up with Angelica Chessman, MD.   Specialty:  Internal Medicine   Contact information:   [place] [address] [phone]       Discharge Instructions:  Diabetes and Exercise Exercising regularly is important. It is not just about losing weight. It has many health benefits, such as:  Improving your overall fitness, flexibility, and endurance.  Increasing your bone density.  Helping with weight control.  Decreasing your body fat.  Increasing your muscle strength.  Reducing stress and tension.  Improving your overall health. People with diabetes who exercise gain additional benefits because exercise:  Reduces appetite.  Improves the body's use of blood sugar (glucose).  Helps lower or control blood glucose.  Decreases blood pressure.  Helps control blood lipids (such as cholesterol and triglycerides).  Improves the body's use of the hormone insulin by:  Increasing the body's insulin sensitivity.  Reducing the body's insulin needs.  Decreases the risk for heart disease because exercising:  Lowers cholesterol and triglycerides levels.  Increases the levels of good cholesterol (such as high-density lipoproteins [HDL]) in the body.  Lowers blood glucose levels. YOUR ACTIVITY PLAN  Choose an activity that you enjoy and set realistic goals. Your health care provider or diabetes educator can help you make an activity plan that works for you. Exercise regularly as directed by your health care provider. This includes:  Performing resistance training twice a week such as push-ups, sit-ups, lifting weights, or using resistance bands.  Performing 150 minutes of cardio exercises each week such as walking, running, or playing sports.  Staying active and spending no more than 90 minutes at one time being inactive. Even short bursts of exercise are good for you. Three 10-minute sessions spread throughout the day are just as beneficial  as a single 30-minute session. Some exercise ideas include:  Taking the dog for a walk.  Taking the stairs instead of the elevator.  Dancing to your favorite song.  Doing an exercise video.  Doing your favorite exercise with a friend.   RECOMMENDATIONS FOR EXERCISING WITH TYPE 1 OR TYPE 2 DIABETES   Check your blood glucose before exercising. If blood glucose levels are greater than 240 mg/dL, check for urine ketones. Do not exercise if ketones are present.  Avoid injecting insulin into areas of the body that are going to be exercised. For example, avoid injecting insulin into:  The arms when playing tennis.  The legs when jogging.  Keep a record of:  Food intake before and after you exercise.  Expected peak times of insulin action.  Blood glucose levels before  and after you exercise.  The type and amount of exercise you have done.  Review your records with your health care provider. Your health care provider will help you to develop guidelines for adjusting food intake and insulin amounts before and after exercising.  If you take insulin or oral hypoglycemic agents, watch for signs and symptoms of hypoglycemia. They include:  Dizziness.  Shaking.  Sweating.  Chills.  Confusion.  Drink plenty of water while you exercise to prevent dehydration or heat stroke. Body water is lost during exercise and must be replaced.  Talk to your health care provider before starting an exercise program to make sure it is safe for you. Remember, almost any type of activity is better than none. Document Released: 06/26/2003 Document Revised: 08/20/2013 Document Reviewed: 09/12/2012 [facility] Patient Information 2015 [place], [place]. This information is not intended to replace advice given to you by your health care provider. Make sure you discuss any questions you have with your health care provider.  Nicotine Addiction Nicotine can act as both a stimulant (excites/activates) and a  sedative (calms/quiets). Immediately after exposure to nicotine, there is a "kick" caused in part by the drug's stimulation of the adrenal glands and resulting discharge of adrenaline (epinephrine). The rush of adrenaline stimulates the body and causes a sudden release of sugar. This means that smokers are always slightly hyperglycemic. Hyperglycemic means that the blood sugar is high, just like in diabetics. Nicotine also decreases the amount of insulin which helps control sugar levels in the body. There is an increase in blood pressure, breathing, and the rate of heart beats.  In addition, nicotine indirectly causes a release of dopamine in the brain that controls pleasure and motivation. A similar reaction is seen with other drugs of abuse, such as cocaine and heroin. This dopamine release is thought to cause the pleasurable sensations when smoking. In some different cases, nicotine can also create a calming effect, depending on sensitivity of the smoker's nervous system and the dose of nicotine taken. WHAT HAPPENS WHEN NICOTINE IS TAKEN FOR LONG PERIODS OF TIME?  Long-term use of nicotine results in addiction. It is difficult to stop.  Repeated use of nicotine creates tolerance. Higher doses of nicotine are needed to get the "kick." When nicotine use is stopped, withdrawal may last a month or more. Withdrawal may begin within a few hours after the last cigarette. Symptoms peak within the first few days and may lessen within a few weeks. For some people, however, symptoms may last for months or longer. Withdrawal symptoms include:   Irritability.  Craving.  Learning and attention deficits.  Sleep disturbances.  Increased appetite. Craving for tobacco may last for 6 months or longer. Many behaviors done while using nicotine can also play a part in the severity of withdrawal symptoms. For some people, the feel, smell, and sight of a cigarette and the ritual of obtaining, handling, lighting, and  smoking the cigarette are closely linked with the pleasure of smoking. When stopped, they also miss the related behaviors which make the withdrawal or craving worse. While nicotine gum and patches may lessen the drug aspects of withdrawal, cravings often persist. WHAT ARE THE MEDICAL CONSEQUENCES OF NICOTINE USE?  Nicotine addiction accounts for one-third of all cancers. The top cancer caused by tobacco is lung cancer. Lung cancer is the number one cancer killer of both men and women.  Smoking is also associated with cancers of the:  Mouth.  Pharynx.  Larynx.  Esophagus.  Stomach.  Pancreas.  Cervix.  Kidney.  Ureter.  Bladder.  Smoking also causes lung diseases such as lasting (chronic) bronchitis and emphysema.  It worsens asthma in adults and children.  Smoking increases the risk of heart disease, including:  Stroke.  Heart attack.  Vascular disease.  Aneurysm.  Passive or secondary smoke can also increase medical risks including:  Asthma in children.  Sudden Infant Death Syndrome (SIDS).  Additionally, dropped cigarettes are the leading cause of residential fire fatalities.  Nicotine poisoning has been reported from accidental ingestion of tobacco products by children and pets. Death usually results in a few minutes from respiratory failure (when a person stops breathing) caused by paralysis. TREATMENT   Medication. Nicotine replacement medicines such as nicotine gum and the patch are used to stop smoking. These medicines gradually lower the dosage of nicotine in the body. These medicines do not contain the carbon monoxide and other toxins found in tobacco smoke.  Hypnotherapy.  Relaxation therapy.  Nicotine Anonymous (a 12-step support program). Find times and locations in your local yellow pages. Document Released: 12/10/2003 Document Revised: 06/28/2011 Document Reviewed: 06/01/2013 [facility] Patient Information 2015 [place], [place]. This information  is not intended to replace advice given to you by your health care provider. Make sure you discuss any questions you have with your health care provider.   Consultations:  neurology  Procedures Performed:  Ct Head Wo Contrast  01/01/2014   CLINICAL DATA:  Left facial droop and slurred speech  EXAM: CT HEAD WITHOUT CONTRAST  TECHNIQUE: Contiguous axial images were obtained from the base of the skull through the vertex without intravenous contrast.  COMPARISON:  Aug 24, 2009  FINDINGS: The ventricles are normal in size and configuration. There is no mass, hemorrhage, extra-axial fluid collection, or midline shift. There is mild small vessel disease in the centra semiovale bilaterally. Elsewhere gray-white compartments appear normal. There is no demonstrable acute infarct. The bony calvarium appears intact. The mastoid air cells are clear. There is mucosal thickening in the right maxillary antrum.  IMPRESSION: Mild periventricular small vessel disease. There is no intracranial mass, hemorrhage, or acute appearing infarct. There is right maxillary sinus disease.   Electronically Signed   By: Lowella Grip M.D.   On: 01/01/2014 07:45   Mr Jodene Nam Head Wo Contrast  01/01/2014   CLINICAL DATA:  Stroke.  Dysarthria with left facial droop  EXAM: MRI HEAD WITHOUT CONTRAST  MRA HEAD WITHOUT CONTRAST  TECHNIQUE: Multiplanar, multiecho pulse sequences of the brain and surrounding structures were obtained without intravenous contrast. Angiographic images of the head were obtained using MRA technique without contrast.  COMPARISON:  CT head 01/01/2014  FINDINGS: MRI HEAD FINDINGS  Negative for acute infarct.  Small white matter hyperintensities bilaterally in the subcortical and deep white matter. Brainstem and cerebellum are normal.  Focal micro hemorrhage right frontal lobe. No other hemorrhage or fluid collection.  Negative for mass or edema.  Pituitary normal in size.  Ventricle size is normal.  Mucosal edema right  maxillary sinus.  MRA HEAD FINDINGS  Both vertebral arteries are patent to the basilar. PICA patent basilar widely patent. Superior cerebellar and posterior cerebral arteries are patent.  Internal carotid artery patent bilaterally. Anterior and middle cerebral arteries are patent bilaterally.  Negative for cerebral aneurysm.  IMPRESSION: Negative for acute infarct  Mild chronic microvascular ischemic change in the white matter.  Negative MRA head   Electronically Signed   By: Franchot Gallo M.D.   On: 01/01/2014 12:59  Mr Brain Wo Contrast  01/01/2014   CLINICAL DATA:  Stroke.  Dysarthria with left facial droop  EXAM: MRI HEAD WITHOUT CONTRAST  MRA HEAD WITHOUT CONTRAST  TECHNIQUE: Multiplanar, multiecho pulse sequences of the brain and surrounding structures were obtained without intravenous contrast. Angiographic images of the head were obtained using MRA technique without contrast.  COMPARISON:  CT head 01/01/2014  FINDINGS: MRI HEAD FINDINGS  Negative for acute infarct.  Small white matter hyperintensities bilaterally in the subcortical and deep white matter. Brainstem and cerebellum are normal.  Focal micro hemorrhage right frontal lobe. No other hemorrhage or fluid collection.  Negative for mass or edema.  Pituitary normal in size.  Ventricle size is normal.  Mucosal edema right maxillary sinus.  MRA HEAD FINDINGS  Both vertebral arteries are patent to the basilar. PICA patent basilar widely patent. Superior cerebellar and posterior cerebral arteries are patent.  Internal carotid artery patent bilaterally. Anterior and middle cerebral arteries are patent bilaterally.  Negative for cerebral aneurysm.  IMPRESSION: Negative for acute infarct  Mild chronic microvascular ischemic change in the white matter.  Negative MRA head   Electronically Signed   By: Franchot Gallo M.D.   On: 01/01/2014 12:59    2D Echo: 01/01/14: LV EF: 55% - 65% - Left ventricle: The cavity size was normal. Wall thickness was normal.  Systolic function was normal. The estimated ejection fraction was in the range of 55% to 65%. Wall motion was normal; there were no regional wall motion abnormalities. - Right atrium: The atrium was mildly dilated.  EEG: 01/02/14 This is a normal EEG recording during wakefulness and during sleep. No evidence of an epileptic disorder is demonstrated.  Admission HPI:  This is a 58 y.o. female with a past medical history significant for HTN, hyperlipidemia, DM, CAD s/p CABG 2005, who presents with dysarthria, and left facial weakness that was noticed on the morning of admission. She lives with her daughter who states that she last saw her normal last night around 11:30 pm on 12/31/2013. She woke up this morning, went to the bathroom and she looked into the mirror and realized that the left side of her face was droopy. Talked to her daughter and also noted slurred speech. They were both concerned for stroke and called EMS. In the ambulance, her symptoms had resolved. She states that she had a slight frontal HA for several days but denies focal weakness-numbness, vertigo, double vision, confusion, imbalance, difficulty swallowing, visual disturbances, CP, SOB, or palpitations. However, patient also states that for the past day she has been wobbly, and she did not quite feel herself, stating that her "equilibrium was off". ER staff reports fluctuating dysarthria and left face weakness since arrival to the ED. At the time of my evaluation, patient's symptoms had completely resolved. CT brain showed no acute intracranial abnormality.   Admission Physical Exam:  Blood pressure 133/89, pulse 61, temperature 99 F (37.2 C), temperature source Oral, resp. rate 14, SpO2 91.00%.  General: well developed, well nourished; no acute distressed, cooperative with exam, daughter at bedside.  Head: atraumatic, normocephalic,  Eye: pupils equal, round and reactive; sclera anicteric; normal conjunctiva  Nose/throat:  oropharynx clear, moist mucous membranes, pink gums  Neck: supple, no carotid bruits.  Lungs/Chest wall: coughs repeatedly in the exam room.clear to auscultation bilaterally, normal work of breathing  Heart: normal rate and regular rhythm; no murmurs  Pulses: radial and dorsalis pedis pulses are 2+ and symmetric  Abdomen: Normal fullness, no rebound, guarding,  or rigidity; normal bowel sounds; no masses or organomegaly  Skin: warm, dry, intact, normal turgor, no rashes  Extremities: no peripheral edema, clubbing, or cyanosis  Psych: patient is alert and oriented, mood and affect are normal and congruent, thought content is normal without delusions, thought process is linear, speech is normal and non-pressured, behavior is normal  Neurologic Exam:    Mental Status:  Alert, oriented, thought content appropriate. Speech fluent without evidence of aphasia. Able to follow 3 step commands without difficulty.   Cranial Nerves:    III/IV/VI:  Extraocular movements intact. Pupils reactive bilaterally.   V/VII:  Smile symmetric. Altered left facial sensation to light tough. Right side normal sensation   VIII:  Grossly intact.   XI:  Bilateral shoulder shrug normal.   XII:  Midline tongue extension normal.   Motor:  5/5 bilaterally with normal tone and bulk   Sensory:  Light touch intact throughout, bilaterally   DTRs:  2+ and symmetric throughout   Plantars:  Downgoing bilaterally   Cerebellar:  Normal finger-to-nose, normal rapid alternating movements and normal heel-to-shin test. Normal gait and station.     Hospital Course by problem list: Active Problems:   URI, acute   Essential hypertension, benign   CAD (coronary artery disease) - CABG x 1 (LIMA-LAD)   DM (diabetes mellitus) type 2, uncontrolled, with ketoacidosis   Tobacco use   Stroke   Visual changes   Facial weakness  Ms. Longshore is a 58 y.o. female with a past medical history significant for HTN, hyperlipidemia, DM, CAD s/p  CABG 2005, who presented to [facility] on 01/01/14 with slurred speech and left facial weakness that was noticed on the morning of admission. She had an MRI/MRA negative for acute infarct but positive for chronic microvascular ischemic changes in the white matter. Her echocardiogram was also negative for major pathology, with an EF of 55-65%. Her EEG was negative. Her carotid dopplers were negative (showed 0-39% occlusion on both sides). However, her HgbA1c of 13.1%, hypertension, previous hyperlipidemia and history of cigarette smoking are risk factors for TIA.   Recurring Right Facial Droop and Increased Sensation in V2/V3 on the Right: Presentation was concerning for ongoing, recurrent TIA (small vessel disease, carotid hypoperfuion), migraine and seizure. As a part of the SOCRATES research study, so was kept on 81mg  aspirin therapy (rather than being switched to Plavix). She was also kept on low dose statin despite her low LDL. However, an observed episode revealed that the patient's nasolabial fold was more flat on the right, inconsistent with her left droop, and given that her lower lip was very pronounced during the droop (more characteristic of volitional droop); these findings were more consistent with a conversion disorder or volitional cause than a true facial droop from TIA.   Essential Hypertension: BP medications were held for permissive hypertension. BP was well-managed off of home BP medications on this admission (120s/70s to 150s/80s). Home medications include hydrochlorothiazide 12.5 mg daily, lisinopril 10 mg daily, and metoprolol, 50 mg twice a day. Patient will be restarted slowly, on just her home beta blocker.  Uncontrolled Type 2 Diabetes: A1c 06/20/2013 was 10.5% --> now 13.1%. Home medication include metformin 1000 mg twice a day, and Farxiga 10 mg daily. She had high CBGs (200-300s) at the beginning of the admission.  Current Smoker: She smokes half pack a day. Counseling provided  along with a nicotine patch.     Discharge Vitals:   BP 147/69  Pulse 88  Temp(Src)  97.9 F (36.6 C) (Oral)  Resp 18  Ht 5\' 6"  (1.676 m)  Wt 224 lb 9.6 oz (101.878 kg)  BMI 36.27 kg/m2  SpO2 100%   Signed: Drucilla Schmidt, MD 01/03/2014, 11:40 AM    Services Ordered on Discharge: none Equipment Ordered on Discharge: none

## 2014-01-09 ENCOUNTER — Encounter (INDEPENDENT_AMBULATORY_CARE_PROVIDER_SITE_OTHER): Payer: Self-pay

## 2014-01-09 DIAGNOSIS — Z0289 Encounter for other administrative examinations: Secondary | ICD-10-CM

## 2014-01-10 ENCOUNTER — Ambulatory Visit: Payer: No Typology Code available for payment source | Attending: Internal Medicine

## 2014-01-10 ENCOUNTER — Ambulatory Visit: Payer: No Typology Code available for payment source | Attending: Internal Medicine | Admitting: Internal Medicine

## 2014-01-10 ENCOUNTER — Encounter: Payer: Self-pay | Admitting: Internal Medicine

## 2014-01-10 VITALS — BP 133/70 | HR 79 | Temp 98.5°F | Resp 16 | Ht 66.0 in | Wt 230.0 lb

## 2014-01-10 DIAGNOSIS — Z79899 Other long term (current) drug therapy: Secondary | ICD-10-CM | POA: Insufficient documentation

## 2014-01-10 DIAGNOSIS — E785 Hyperlipidemia, unspecified: Secondary | ICD-10-CM

## 2014-01-10 DIAGNOSIS — Z7982 Long term (current) use of aspirin: Secondary | ICD-10-CM | POA: Insufficient documentation

## 2014-01-10 DIAGNOSIS — Z791 Long term (current) use of non-steroidal anti-inflammatories (NSAID): Secondary | ICD-10-CM | POA: Insufficient documentation

## 2014-01-10 DIAGNOSIS — I10 Essential (primary) hypertension: Secondary | ICD-10-CM | POA: Insufficient documentation

## 2014-01-10 DIAGNOSIS — I2510 Atherosclerotic heart disease of native coronary artery without angina pectoris: Secondary | ICD-10-CM | POA: Insufficient documentation

## 2014-01-10 DIAGNOSIS — E1165 Type 2 diabetes mellitus with hyperglycemia: Principal | ICD-10-CM

## 2014-01-10 DIAGNOSIS — F3289 Other specified depressive episodes: Secondary | ICD-10-CM | POA: Insufficient documentation

## 2014-01-10 DIAGNOSIS — F32A Depression, unspecified: Secondary | ICD-10-CM

## 2014-01-10 DIAGNOSIS — Z8673 Personal history of transient ischemic attack (TIA), and cerebral infarction without residual deficits: Secondary | ICD-10-CM | POA: Insufficient documentation

## 2014-01-10 DIAGNOSIS — F329 Major depressive disorder, single episode, unspecified: Secondary | ICD-10-CM

## 2014-01-10 DIAGNOSIS — E1110 Type 2 diabetes mellitus with ketoacidosis without coma: Secondary | ICD-10-CM

## 2014-01-10 DIAGNOSIS — E1310 Other specified diabetes mellitus with ketoacidosis without coma: Secondary | ICD-10-CM

## 2014-01-10 DIAGNOSIS — IMO0001 Reserved for inherently not codable concepts without codable children: Secondary | ICD-10-CM | POA: Insufficient documentation

## 2014-01-10 DIAGNOSIS — Z951 Presence of aortocoronary bypass graft: Secondary | ICD-10-CM | POA: Insufficient documentation

## 2014-01-10 LAB — GLUCOSE, POCT (MANUAL RESULT ENTRY): POC Glucose: 147 mg/dl — AB (ref 70–99)

## 2014-01-10 MED ORDER — GABAPENTIN 100 MG PO CAPS: 100.0000 mg | ORAL_CAPSULE | Freq: Every day | ORAL | Status: DC

## 2014-01-10 MED ORDER — CITALOPRAM HYDROBROMIDE 40 MG PO TABS: 40.0000 mg | ORAL_TABLET | Freq: Every day | ORAL | Status: DC

## 2014-01-10 MED ORDER — METFORMIN HCL ER 500 MG PO TB24: 1000.0000 mg | ORAL_TABLET | Freq: Two times a day (BID) | ORAL | Status: DC

## 2014-01-10 MED ORDER — METOPROLOL TARTRATE 50 MG PO TABS: 50.0000 mg | ORAL_TABLET | Freq: Two times a day (BID) | ORAL | Status: DC

## 2014-01-10 MED ORDER — LISINOPRIL 10 MG PO TABS: 10.0000 mg | ORAL_TABLET | Freq: Every day | ORAL | Status: DC

## 2014-01-10 MED ORDER — PANTOPRAZOLE SODIUM 40 MG PO TBEC: 40.0000 mg | DELAYED_RELEASE_TABLET | Freq: Every day | ORAL | Status: DC

## 2014-01-10 MED ORDER — ATORVASTATIN CALCIUM 10 MG PO TABS: 10.0000 mg | ORAL_TABLET | Freq: Every day | ORAL | Status: DC

## 2014-01-10 MED ORDER — INSULIN GLARGINE 300 UNIT/ML ~~LOC~~ SOPN: 16.0000 [IU] | PEN_INJECTOR | Freq: Every day | SUBCUTANEOUS | Status: DC

## 2014-01-10 MED ORDER — HYDROCHLOROTHIAZIDE 12.5 MG PO TABS: 12.5000 mg | ORAL_TABLET | Freq: Every day | ORAL | Status: DC

## 2014-01-10 NOTE — Progress Notes (Signed)
LCSW met with patient who was tearful and depressed.  LCSW assessed that patient had some challenges currently.  LCSW encouraged patien tto make a follow up appointment with the LCSW to assess further needs for counseling and mental health support.    Christene Lye MSW, LCSW

## 2014-01-10 NOTE — Patient Instructions (Signed)
DASH Eating Plan DASH stands for "Dietary Approaches to Stop Hypertension." The DASH eating plan is a healthy eating plan that has been shown to reduce high blood pressure (hypertension). Additional health benefits may include reducing the risk of type 2 diabetes mellitus, heart disease, and stroke. The DASH eating plan may also help with weight loss. WHAT DO I NEED TO KNOW ABOUT THE DASH EATING PLAN? For the DASH eating plan, you will follow these general guidelines:  Choose foods with a percent daily value for sodium of less than 5% (as listed on the food label).  Use salt-free seasonings or herbs instead of table salt or sea salt.  Check with your health care provider or pharmacist before using salt substitutes.  Eat lower-sodium products, often labeled as "lower sodium" or "no salt added."  Eat fresh foods.  Eat more vegetables, fruits, and low-fat dairy products.  Choose whole grains. Look for the word "whole" as the first word in the ingredient list.  Choose fish and skinless chicken or turkey more often than red meat. Limit fish, poultry, and meat to 6 oz (170 g) each day.  Limit sweets, desserts, sugars, and sugary drinks.  Choose heart-healthy fats.  Limit cheese to 1 oz (28 g) per day.  Eat more home-cooked food and less restaurant, buffet, and fast food.  Limit fried foods.  Cook foods using methods other than frying.  Limit canned vegetables. If you do use them, rinse them well to decrease the sodium.  When eating at a restaurant, ask that your food be prepared with less salt, or no salt if possible. WHAT FOODS CAN I EAT? Seek help from a dietitian for individual calorie needs. Grains Whole grain or whole wheat bread. Brown rice. Whole grain or whole wheat pasta. Quinoa, bulgur, and whole grain cereals. Low-sodium cereals. Corn or whole wheat flour tortillas. Whole grain cornbread. Whole grain crackers. Low-sodium crackers. Vegetables Fresh or frozen vegetables  (raw, steamed, roasted, or grilled). Low-sodium or reduced-sodium tomato and vegetable juices. Low-sodium or reduced-sodium tomato sauce and paste. Low-sodium or reduced-sodium canned vegetables.  Fruits All fresh, canned (in natural juice), or frozen fruits. Meat and Other Protein Products Ground beef (85% or leaner), grass-fed beef, or beef trimmed of fat. Skinless chicken or turkey. Ground chicken or turkey. Pork trimmed of fat. All fish and seafood. Eggs. Dried beans, peas, or lentils. Unsalted nuts and seeds. Unsalted canned beans. Dairy Low-fat dairy products, such as skim or 1% milk, 2% or reduced-fat cheeses, low-fat ricotta or cottage cheese, or plain low-fat yogurt. Low-sodium or reduced-sodium cheeses. Fats and Oils Tub margarines without trans fats. Light or reduced-fat mayonnaise and salad dressings (reduced sodium). Avocado. Safflower, olive, or canola oils. Natural peanut or almond butter. Other Unsalted popcorn and pretzels. The items listed above may not be a complete list of recommended foods or beverages. Contact your dietitian for more options. WHAT FOODS ARE NOT RECOMMENDED? Grains White bread. White pasta. White rice. Refined cornbread. Bagels and croissants. Crackers that contain trans fat. Vegetables Creamed or fried vegetables. Vegetables in a cheese sauce. Regular canned vegetables. Regular canned tomato sauce and paste. Regular tomato and vegetable juices. Fruits Dried fruits. Canned fruit in light or heavy syrup. Fruit juice. Meat and Other Protein Products Fatty cuts of meat. Ribs, chicken wings, bacon, sausage, bologna, salami, chitterlings, fatback, hot dogs, bratwurst, and packaged luncheon meats. Salted nuts and seeds. Canned beans with salt. Dairy Whole or 2% milk, cream, half-and-half, and cream cheese. Whole-fat or sweetened yogurt. Full-fat   cheeses or blue cheese. Nondairy creamers and whipped toppings. Processed cheese, cheese spreads, or cheese  curds. Condiments Onion and garlic salt, seasoned salt, table salt, and sea salt. Canned and packaged gravies. Worcestershire sauce. Tartar sauce. Barbecue sauce. Teriyaki sauce. Soy sauce, including reduced sodium. Steak sauce. Fish sauce. Oyster sauce. Cocktail sauce. Horseradish. Ketchup and mustard. Meat flavorings and tenderizers. Bouillon cubes. Hot sauce. Tabasco sauce. Marinades. Taco seasonings. Relishes. Fats and Oils Butter, stick margarine, lard, shortening, ghee, and bacon fat. Coconut, palm kernel, or palm oils. Regular salad dressings. Other Pickles and olives. Salted popcorn and pretzels. The items listed above may not be a complete list of foods and beverages to avoid. Contact your dietitian for more information. WHERE CAN I FIND MORE INFORMATION? National Heart, Lung, and Blood Institute: travelstabloid.com Document Released: 03/25/2011 Document Revised: 08/20/2013 Document Reviewed: 02/07/2013 [facility] Patient Information 2015 [place], [place]. This information is not intended to replace advice given to you by your health care provider. Make sure you discuss any questions you have with your health care provider. Hypertension Hypertension, commonly called high blood pressure, is when the force of blood pumping through your arteries is too strong. Your arteries are the blood vessels that carry blood from your heart throughout your body. A blood pressure reading consists of a higher number over a lower number, such as 110/72. The higher number (systolic) is the pressure inside your arteries when your heart pumps. The lower number (diastolic) is the pressure inside your arteries when your heart relaxes. Ideally you want your blood pressure below 120/80. Hypertension forces your heart to work harder to pump blood. Your arteries may become narrow or stiff. Having hypertension puts you at risk for heart disease, stroke, and other problems.  RISK  FACTORS Some risk factors for high blood pressure are controllable. Others are not.  Risk factors you cannot control include:   Race. You may be at higher risk if you are African American.  Age. Risk increases with age.  Gender. Men are at higher risk than women before age 37 years. After age 55, women are at higher risk than men. Risk factors you can control include:  Not getting enough exercise or physical activity.  Being overweight.  Getting too much fat, sugar, calories, or salt in your diet.  Drinking too much alcohol. SIGNS AND SYMPTOMS Hypertension does not usually cause signs or symptoms. Extremely high blood pressure (hypertensive crisis) may cause headache, anxiety, shortness of breath, and nosebleed. DIAGNOSIS  To check if you have hypertension, your health care provider will measure your blood pressure while you are seated, with your arm held at the level of your heart. It should be measured at least twice using the same arm. Certain conditions can cause a difference in blood pressure between your right and left arms. A blood pressure reading that is higher than normal on one occasion does not mean that you need treatment. If one blood pressure reading is high, ask your health care provider about having it checked again. TREATMENT  Treating high blood pressure includes making lifestyle changes and possibly taking medicine. Living a healthy lifestyle can help lower high blood pressure. You may need to change some of your habits. Lifestyle changes may include:  Following the DASH diet. This diet is high in fruits, vegetables, and whole grains. It is low in salt, red meat, and added sugars.  Getting at least 2 hours of brisk physical activity every week.  Losing weight if necessary.  Not smoking.  Limiting  alcoholic beverages.  Learning ways to reduce stress. If lifestyle changes are not enough to get your blood pressure under control, your health care provider may  prescribe medicine. You may need to take more than one. Work closely with your health care provider to understand the risks and benefits. HOME CARE INSTRUCTIONS  Have your blood pressure rechecked as directed by your health care provider.   Take medicines only as directed by your health care provider. Follow the directions carefully. Blood pressure medicines must be taken as prescribed. The medicine does not work as well when you skip doses. Skipping doses also puts you at risk for problems.   Do not smoke.   Monitor your blood pressure at home as directed by your health care provider. SEEK MEDICAL CARE IF:   You think you are having a reaction to medicines taken.  You have recurrent headaches or feel dizzy.  You have swelling in your ankles.  You have trouble with your vision. SEEK IMMEDIATE MEDICAL CARE IF:  You develop a severe headache or confusion.  You have unusual weakness, numbness, or feel faint.  You have severe chest or abdominal pain.  You vomit repeatedly.  You have trouble breathing. MAKE SURE YOU:   Understand these instructions.  Will watch your condition.  Will get help right away if you are not doing well or get worse. Document Released: 04/05/2005 Document Revised: 08/20/2013 Document Reviewed: 01/26/2013 [employer] Patient Information 2015 [employer]. This information is not intended to replace advice given to you by your health care provider. Make sure you discuss any questions you have with your health care provider. Diabetes and Exercise Exercising regularly is important. It is not just about losing weight. It has many health benefits, such as:  Improving your overall fitness, flexibility, and endurance.  Increasing your bone density.  Helping with weight control.  Decreasing your body fat.  Increasing your muscle strength.  Reducing stress and tension.  Improving your overall health. People with diabetes who exercise gain  additional benefits because exercise:  Reduces appetite.  Improves the body's use of blood sugar (glucose).  Helps lower or control blood glucose.  Decreases blood pressure.  Helps control blood lipids (such as cholesterol and triglycerides).  Improves the body's use of the hormone insulin by:  Increasing the body's insulin sensitivity.  Reducing the body's insulin needs.  Decreases the risk for heart disease because exercising:  Lowers cholesterol and triglycerides levels.  Increases the levels of good cholesterol (such as high-density lipoproteins [HDL]) in the body.  Lowers blood glucose levels. YOUR ACTIVITY PLAN  Choose an activity that you enjoy and set realistic goals. Your health care provider or diabetes educator can help you make an activity plan that works for you. Exercise regularly as directed by your health care provider. This includes:  Performing resistance training twice a week such as push-ups, sit-ups, lifting weights, or using resistance bands.  Performing 150 minutes of cardio exercises each week such as walking, running, or playing sports.  Staying active and spending no more than 90 minutes at one time being inactive. Even short bursts of exercise are good for you. Three 10-minute sessions spread throughout the day are just as beneficial as a single 30-minute session. Some exercise ideas include:  Taking the dog for a walk.  Taking the stairs instead of the elevator.  Dancing to your favorite song.  Doing an exercise video.  Doing your favorite exercise with a friend. RECOMMENDATIONS FOR EXERCISING WITH TYPE 1 OR   TYPE 2 DIABETES   Check your blood glucose before exercising. If blood glucose levels are greater than 240 mg/dL, check for urine ketones. Do not exercise if ketones are present.  Avoid injecting insulin into areas of the body that are going to be exercised. For example, avoid injecting insulin into:  The arms when playing  tennis.  The legs when jogging.  Keep a record of:  Food intake before and after you exercise.  Expected peak times of insulin action.  Blood glucose levels before and after you exercise.  The type and amount of exercise you have done.  Review your records with your health care provider. Your health care provider will help you to develop guidelines for adjusting food intake and insulin amounts before and after exercising.  If you take insulin or oral hypoglycemic agents, watch for signs and symptoms of hypoglycemia. They include:  Dizziness.  Shaking.  Sweating.  Chills.  Confusion.  Drink plenty of water while you exercise to prevent dehydration or heat stroke. Body water is lost during exercise and must be replaced.  Talk to your health care provider before starting an exercise program to make sure it is safe for you. Remember, almost any type of activity is better than none. Document Released: 06/26/2003 Document Revised: 08/20/2013 Document Reviewed: 09/12/2012 [facility] Patient Information 2015 [place], [place]. This information is not intended to replace advice given to you by your health care provider. Make sure you discuss any questions you have with your health care provider. Diabetes Mellitus and Food It is important for you to manage your blood sugar (glucose) level. Your blood glucose level can be greatly affected by what you eat. Eating healthier foods in the appropriate amounts throughout the day at about the same time each day will help you control your blood glucose level. It can also help slow or prevent worsening of your diabetes mellitus. Healthy eating may even help you improve the level of your blood pressure and reach or maintain a healthy weight.  HOW CAN FOOD AFFECT ME? Carbohydrates Carbohydrates affect your blood glucose level more than any other type of food. Your dietitian will help you determine how many carbohydrates to eat at each meal and teach you  how to count carbohydrates. Counting carbohydrates is important to keep your blood glucose at a healthy level, especially if you are using insulin or taking certain medicines for diabetes mellitus. Alcohol Alcohol can cause sudden decreases in blood glucose (hypoglycemia), especially if you use insulin or take certain medicines for diabetes mellitus. Hypoglycemia can be a life-threatening condition. Symptoms of hypoglycemia (sleepiness, dizziness, and disorientation) are similar to symptoms of having too much alcohol.  If your health care provider has given you approval to drink alcohol, do so in moderation and use the following guidelines:  Women should not have more than one drink per day, and men should not have more than two drinks per day. One drink is equal to:  12 oz of beer.  5 oz of wine.  1 oz of hard liquor.  Do not drink on an empty stomach.  Keep yourself hydrated. Have water, diet soda, or unsweetened iced tea.  Regular soda, juice, and other mixers might contain a lot of carbohydrates and should be counted. WHAT FOODS ARE NOT RECOMMENDED? As you make food choices, it is important to remember that all foods are not the same. Some foods have fewer nutrients per serving than other foods, even though they might have the same number of calories  or carbohydrates. It is difficult to get your body what it needs when you eat foods with fewer nutrients. Examples of foods that you should avoid that are high in calories and carbohydrates but low in nutrients include:  Trans fats (most processed foods list trans fats on the Nutrition Facts label).  Regular soda.  Juice.  Candy.  Sweets, such as cake, pie, doughnuts, and cookies.  Fried foods. WHAT FOODS CAN I EAT? Have nutrient-rich foods, which will nourish your body and keep you healthy. The food you should eat also will depend on several factors, including:  The calories you need.  The medicines you take.  Your  weight.  Your blood glucose level.  Your blood pressure level.  Your cholesterol level. You also should eat a variety of foods, including:  Protein, such as meat, poultry, fish, tofu, nuts, and seeds (lean animal proteins are best).  Fruits.  Vegetables.  Dairy products, such as milk, cheese, and yogurt (low fat is best).  Breads, grains, pasta, cereal, rice, and beans.  Fats such as olive oil, trans fat-free margarine, canola oil, avocado, and olives. DOES EVERYONE WITH DIABETES MELLITUS HAVE THE SAME MEAL PLAN? Because every person with diabetes mellitus is different, there is not one meal plan that works for everyone. It is very important that you meet with a dietitian who will help you create a meal plan that is just right for you. Document Released: 12/31/2004 Document Revised: 04/10/2013 Document Reviewed: 03/02/2013 [facility] Patient Information 2015 [place], [place]. This information is not intended to replace advice given to you by your health care provider. Make sure you discuss any questions you have with your health care provider.

## 2014-01-10 NOTE — Progress Notes (Signed)
Patient ID: Deborah Coffey, female   DOB: May 30, 1955, 58 y.o.   MRN: [id number]   Deborah Coffey, is a 58 y.o. female  [id number]  [id number]  DOB - [birth year]  Chief Complaint  Patient presents with  . Follow-up  . Hypertension  . Diabetes  . Medication Refill        Subjective:   Deborah Coffey is a 58 y.o. female here today for a follow up visit. Patient has history of hypertension, diabetes mellitus dyslipidemia, coronary artery disease status post CABG, CVA with residual slurred speech, was recently admitted into the hospital for another episode of sudden onset left-sided facial droop, slurred speech. She had MRI brain, CT head, EEG, echocardiogram and carotid Dopplers were negative for acute stroke. Her hemoglobin A1c is 13.1%. She continues to smoke cigarette. Patient is tearful during this encounter, saying she is depressed, tired of having all these symptoms, her daughter and her grandchildren lives with her which is also a source of stress due to financial insecurity, she has not been able to work since her first stroke. She has little or no income. She is on aspirin, she enlisted for a drug trial with [facility]. Today she is here for hospital discharge followup. She has no new complaint except that she is depressed and tearful. Her blood pressure is controlled, her blood sugar now reads better with the new insulin regimen, she said her fasting blood sugars usually between 90 and 110. Patient has No headache, No chest pain, No abdominal pain - No Nausea, No new weakness tingling or numbness, No Cough - SOB.  Problem  Essential Hypertension  Depression (Emotion)  H/O Tia (Transient Ischemic Attack) and Stroke    ALLERGIES: Allergies  Allergen Reactions  . Ciprofloxacin Rash    PAST MEDICAL HISTORY: Past Medical History  Diagnosis Date  . CAD in native artery 02/2004    Mid LAD lesoin just after Large D1 --> CABG x 1 LIMA-LAD  . S/P CABG x 1 02/2004      LIMA-LAD; patent by cath in 2010 (LAD lesion actually improved. competitive flow  . Hypertension   . High cholesterol   . Type II diabetes mellitus   . Anemia   . History of blood transfusion 2005    "related to [facility]HS"   . Allergy     MEDICATIONS AT HOME: Prior to Admission medications   Medication Sig Start Date End Date Taking? Authorizing Provider  aspirin 81 MG tablet Take 1 tablet (81 mg total) by mouth daily. 02/14/13  Yes Ripudeep Krystal Eaton, MD  atorvastatin (LIPITOR) 10 MG tablet Take 1 tablet (10 mg total) by mouth daily. 01/10/14  Yes Tresa Garter, MD  citalopram (CELEXA) 40 MG tablet Take 1 tablet (40 mg total) by mouth daily. 01/10/14  Yes Tresa Garter, MD  gabapentin (NEURONTIN) 100 MG capsule Take 1 capsule (100 mg total) by mouth daily. 01/10/14  Yes Tresa Garter, MD  hydrochlorothiazide (HYDRODIURIL) 12.5 MG tablet Take 1 tablet (12.5 mg total) by mouth daily. 01/10/14  Yes Tresa Garter, MD  Insulin Glargine (TOUJEO SOLOSTAR) 300 UNIT/ML SOPN Inject 16 Units into the skin daily with breakfast. 01/10/14  Yes Nakema Fake E Doreene Burke, MD  lisinopril (PRINIVIL,ZESTRIL) 10 MG tablet Take 1 tablet (10 mg total) by mouth daily. 01/10/14  Yes Tresa Garter, MD  metFORMIN (GLUCOPHAGE XR) 500 MG 24 hr tablet Take 2 tablets (1,000 mg total) by mouth 2 (two) times daily with a meal. 01/10/14  Yes Braddock Servellon  Essie Christine, MD  metoprolol (LOPRESSOR) 50 MG tablet Take 1 tablet (50 mg total) by mouth 2 (two) times daily. 01/10/14  Yes Tresa Garter, MD  pantoprazole (PROTONIX) 40 MG tablet Take 1 tablet (40 mg total) by mouth daily. 01/10/14  Yes Tresa Garter, MD  senna-docusate (SENOKOT-S) 8.6-50 MG per tablet Take 1 tablet by mouth at bedtime as needed for mild constipation. 03/23/13  Yes Ripudeep Krystal Eaton, MD  naproxen (NAPROSYN) 500 MG tablet Take 1 tablet (500 mg total) by mouth 2 (two) times daily with a meal. 03/19/13   Kalman Drape, MD  nicotine (NICODERM CQ)  21 mg/24hr patch Place 1 patch (21 mg total) onto the skin daily. 01/03/14   Drucilla Schmidt, MD  nitroGLYCERIN (NITROSTAT) 0.4 MG SL tablet Place 1 tablet (0.4 mg total) under the tongue every 5 (five) minutes as needed for chest pain. 03/23/13   Ripudeep Krystal Eaton, MD  traMADol (ULTRAM) 50 MG tablet Take 1 tablet (50 mg total) by mouth every 6 (six) hours as needed. 03/23/13   Ripudeep Krystal Eaton, MD     Objective:   Filed Vitals:   01/10/14 1045  BP: 133/70  Pulse: 79  Temp: 98.5 F (36.9 C)  TempSrc: Oral  Resp: 16  Height: 5\' 6"  (1.676 m)  Weight: 230 lb (104.327 kg)  SpO2: 97%    Exam General appearance : Awake, alert, not in any distress. Speech Clear. Not toxic looking, tearful, obese. HEENT: Atraumatic and Normocephalic, pupils equally reactive to light and accomodation Neck: supple, no JVD. No cervical lymphadenopathy.  Chest:Good air entry bilaterally, no added sounds  CVS: S1 S2 regular, no murmurs.  Abdomen: Bowel sounds present, Non tender and not distended with no gaurding, rigidity or rebound. Extremities: B/L Lower Ext shows no edema, both legs are warm to touch Neurology: Awake alert, and oriented X 3, CN II-XII intact, Non focal Skin:No Rash Wounds:N/A  Data Review Lab Results  Component Value Date   HGBA1C 12.9* 01/02/2014   HGBA1C 13.1* 01/01/2014   HGBA1C 10.5 06/20/2013     Assessment & Plan   1. DM (diabetes mellitus) type 2, uncontrolled, with ketoacidosis  - gabapentin (NEURONTIN) 100 MG capsule; Take 1 capsule (100 mg total) by mouth daily.  Dispense: 90 capsule; Refill: 3 - Insulin Glargine (TOUJEO SOLOSTAR) 300 UNIT/ML SOPN; Inject 16 Units into the skin daily with breakfast.  Dispense: 1.5 mL; Refill: 3 - metFORMIN (GLUCOPHAGE XR) 500 MG 24 hr tablet; Take 2 tablets (1,000 mg total) by mouth 2 (two) times daily with a meal.  Dispense: 180 tablet; Refill: 3  - Microalbumin/Creatinine Ratio, Urine - Microalbumin, urine  2. Essential hypertension: Blood  pressures at Goal Refill - metoprolol (LOPRESSOR) 50 MG tablet; Take 1 tablet (50 mg total) by mouth 2 (two) times daily.  Dispense: 180 tablet; Refill: 3 - pantoprazole (PROTONIX) 40 MG tablet; Take 1 tablet (40 mg total) by mouth daily.  Dispense: 90 tablet; Refill: 3 - hydrochlorothiazide (HYDRODIURIL) 12.5 MG tablet; Take 1 tablet (12.5 mg total) by mouth daily.  Dispense: 90 tablet; Refill: 3 - lisinopril (PRINIVIL,ZESTRIL) 10 MG tablet; Take 1 tablet (10 mg total) by mouth daily.  Dispense: 90 tablet; Refill: 3  3. Dyslipidemia Refill - atorvastatin (LIPITOR) 10 MG tablet; Take 1 tablet (10 mg total) by mouth daily.  Dispense: 90 tablet; Refill: 3  4. Depression (emotion) Start - citalopram (CELEXA) 40 MG tablet; Take 1 tablet (40 mg total) by mouth daily.  Dispense: 30  tablet; Refill: 3  5. H/O TIA (transient ischemic attack) and stroke Continue aspirin - Ambulatory referral to Neurology  Patient was extensively counseled on nutrition and exercise Patient was again counseled extensively on smoking cessation  Patient was given a same day appointment with our social worker here, he had a session on counseling   Return in about 3 months (around 04/11/2014), or if symptoms worsen or fail to improve, for Hemoglobin A1C and Follow up, DM, Follow up HTN.  The patient was given clear instructions to go to ER or return to medical center if symptoms don't improve, worsen or new problems develop. The patient verbalized understanding. The patient was told to call to get lab results if they haven't heard anything in the next week.   This note has been created with Surveyor, quantity. Any transcriptional errors are unintentional.    Angelica Chessman, MD, [place], [place], [place] and [facility] [place], [place]   01/10/2014, 11:16 AM

## 2014-01-10 NOTE — Progress Notes (Signed)
Pt comes in per HFU- Discharged s/p stroke and DM uncontrolled Ketoacidosis Pt states she is feeling better States she is compliant with taking prescribed medication daily Need med refill sent to [facility] Denies blurry vision,dizziness or headache Requesting flu/PNA vaccine

## 2014-01-11 LAB — MICROALBUMIN / CREATININE URINE RATIO
Creatinine, Urine: 134.8 mg/dL
Microalb Creat Ratio: 152.8 mg/g — ABNORMAL HIGH (ref 0.0–30.0)
Microalb, Ur: 20.6 mg/dL — ABNORMAL HIGH (ref <2.0)

## 2014-01-16 ENCOUNTER — Telehealth: Payer: Self-pay | Admitting: Internal Medicine

## 2014-01-16 ENCOUNTER — Other Ambulatory Visit: Payer: Self-pay | Admitting: Internal Medicine

## 2014-01-16 ENCOUNTER — Telehealth: Payer: Self-pay | Admitting: Emergency Medicine

## 2014-01-16 DIAGNOSIS — E1110 Type 2 diabetes mellitus with ketoacidosis without coma: Secondary | ICD-10-CM

## 2014-01-16 MED ORDER — INSULIN GLARGINE 300 UNIT/ML ~~LOC~~ SOPN: 16.0000 [IU] | PEN_INJECTOR | Freq: Every day | SUBCUTANEOUS | Status: DC

## 2014-01-16 NOTE — Telephone Encounter (Signed)
Message copied by Ricci Barker on Wed Jan 16, 2014  2:05 PM ------      Message from: Angelica Chessman E      Created: Wed Jan 16, 2014 10:38 AM       Please inform patient that his urine shows evidence of protein leakage from the kidneys, this is usually from complications of diabetes on the kidneys. Would advise strict blood sugar control with a blood sugar goal of between 70 and 110 fasting. Low carbohydrate diet, regular physical exercise. Take medications as prescribed regularly.            Aim for 2-3 Carb Choices per meal (30-45 grams) +/- 1 either way       Aim for 0-15 Carbs per snack if hungry       Include protein in moderation with your meals and snacks       Consider reading food labels for Total Carbohydrate and Fat Grams of foods       Consider checking BG at alternate times per day       Continue taking medication as directed      Fruit Punch - find one with no sugar       Measure and decrease portions of carbohydrate foods       Make your plate and don't go back for seconds                   ------

## 2014-01-16 NOTE — Telephone Encounter (Signed)
Left message for pt to call for message

## 2014-01-16 NOTE — Telephone Encounter (Signed)
Left message for pt to call for lab results 

## 2014-01-16 NOTE — Telephone Encounter (Signed)
Pt returning call regarding results. Please f/u with pt.

## 2014-01-22 ENCOUNTER — Telehealth: Payer: Self-pay | Admitting: Emergency Medicine

## 2014-01-22 NOTE — Telephone Encounter (Signed)
Pt returning call for nurse. Please f/u with pt.  °

## 2014-01-22 NOTE — Telephone Encounter (Signed)
Pt given lab results with diet/exercise instructions

## 2014-01-22 NOTE — Telephone Encounter (Signed)
Message on patient's VM to return call to discuss lab results.    Message from: Angelica Chessman E  Created: Wed Jan 16, 2014 10:38 AM  Please inform patient that his urine shows evidence of protein leakage from the kidneys, this is usually from complications of diabetes on the kidneys. Would advise strict blood sugar control with a blood sugar goal of between 70 and 110 fasting. Low carbohydrate diet, regular physical exercise. Take medications as prescribed regularly.  Aim for 2-3 Carb Choices per meal (30-45 grams) +/- 1 either way  Aim for 0-15 Carbs per snack if hungry  Include protein in moderation with your meals and snacks  Consider reading food labels for Total Carbohydrate and Fat Grams of foods  Consider checking BG at alternate times per day  Continue taking medication as directed  Fruit Punch - find one with no sugar  Measure and decrease portions of carbohydrate foods  Make your plate and don't go back for seconds

## 2014-01-28 ENCOUNTER — Ambulatory Visit: Payer: Self-pay

## 2014-02-01 ENCOUNTER — Other Ambulatory Visit: Payer: Self-pay

## 2014-02-15 ENCOUNTER — Ambulatory Visit: Payer: Self-pay

## 2014-02-22 ENCOUNTER — Ambulatory Visit (INDEPENDENT_AMBULATORY_CARE_PROVIDER_SITE_OTHER): Payer: Self-pay | Admitting: Neurology

## 2014-02-22 ENCOUNTER — Encounter: Payer: Self-pay | Admitting: Neurology

## 2014-02-22 VITALS — BP 140/82 | HR 76 | Temp 99.5°F | Resp 18 | Ht 66.0 in | Wt 233.8 lb

## 2014-02-22 DIAGNOSIS — F329 Major depressive disorder, single episode, unspecified: Secondary | ICD-10-CM

## 2014-02-22 DIAGNOSIS — G459 Transient cerebral ischemic attack, unspecified: Secondary | ICD-10-CM

## 2014-02-22 DIAGNOSIS — F32A Depression, unspecified: Secondary | ICD-10-CM

## 2014-02-22 NOTE — Progress Notes (Addendum)
NEUROLOGY CONSULTATION NOTE  Deborah Coffey MRN: [id number] DOB: [birth year]  Referring provider: Dr. Doreene Burke Primary care provider: Dr. Doreene Burke  Reason for consult:  Stroke  HISTORY OF PRESENT ILLNESS: Deborah Coffey is a 58 year old right-handed woman with past medical history of tobacco abuse, hypertension, hyperlipidemia, type II diabetes, CAD status post CABG and medication non-compliance who presents for stroke.  Hospital records, CT, MRI/MRA and labs personally reviewed.  She was admitted to [facility] on 01/01/14 after waking up that morning and noted that she had left facial droop and slurred speech with slight headache.  She was not a tPA candidate due to mild deficits and late presentation.  CT of the head showed mild small vessel ischemic changes.  MRI of the brain showed revealed no acute infarct.  MRA of the head was unremarkable.  2D echo showed LVEF 55-65% with no source of embolus.  Carotid doppler showed no hemodynamically significant stenosis.  LDL was 45.  Her Hgb A1c was 13.1.  Due to the fluctuation of slurred speech and recurrent facial asymmetry, an EEG was performed, which was normal.    Exam at the time by the stroke team did mention mild facial asymmetry.  However, she describes that her facial droop was more of a twisting of her mouth.  She says she was told that she may be having "muscle spasms" in her face or possibly the TIAs were related to stress.  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  This was particularly hurtful, because she worked there for 14 years and she was close with her colleagues.  She did work for a bit somewhere else, but she injured herself on the job.  She is in the process of moving out of her own place and moving in with her daughter because she can't afford it.  She is very tearful and feels like a failure.  PAST MEDICAL HISTORY: Past Medical History  Diagnosis Date  . CAD in native artery  02/2004    Mid LAD lesoin just after Large D1 --> CABG x 1 LIMA-LAD  . 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 competitive flow  . Hypertension   . High cholesterol   . Type II diabetes mellitus   . Anemia   . History of blood transfusion 2005    "related to OHS"   . Allergy     PAST SURGICAL HISTORY: Past Surgical History  Procedure Laterality Date  . Cesarean section  1977; 1989  . Tubal ligation  1989  . Coronary artery bypass graft  02/2004    "CABG X1" (03/22/2013); LIMA-LAD  . Cardiac catheterization  2010    Previous LAD 95% lesion - now ~30-40%; patent LIMA with competitive flow  . Cholecystectomy  ~ 2012    MEDICATIONS: Current Outpatient Prescriptions on File Prior to Visit  Medication Sig Dispense Refill  . aspirin 81 MG tablet Take 1 tablet (81 mg total) by mouth daily. 30 tablet 5  . atorvastatin (LIPITOR) 10 MG tablet Take 1 tablet (10 mg total) by mouth daily. 90 tablet 3  . citalopram (CELEXA) 40 MG tablet Take 1 tablet (40 mg total) by mouth daily. 30 tablet 3  . gabapentin (NEURONTIN) 100 MG capsule Take 1 capsule (100 mg total) by mouth daily. 90 capsule 3  . hydrochlorothiazide (HYDRODIURIL) 12.5 MG tablet Take 1 tablet (12.5 mg total) by  mouth daily. 90 tablet 3  . Insulin Glargine (TOUJEO SOLOSTAR) 300 UNIT/ML SOPN Inject 16 Units into the skin daily with breakfast. 3 pen 3  . lisinopril (PRINIVIL,ZESTRIL) 10 MG tablet Take 1 tablet (10 mg total) by mouth daily. 90 tablet 3  . metFORMIN (GLUCOPHAGE XR) 500 MG 24 hr tablet Take 2 tablets (1,000 mg total) by mouth 2 (two) times daily with a meal. 180 tablet 3  . metoprolol (LOPRESSOR) 50 MG tablet Take 1 tablet (50 mg total) by mouth 2 (two) times daily. 180 tablet 3  . nicotine (NICODERM CQ) 21 mg/24hr patch Place 1 patch (21 mg total) onto the skin daily. 7 patch 0  . nitroGLYCERIN (NITROSTAT) 0.4 MG SL tablet Place 1 tablet (0.4 mg total) under the tongue  every 5 (five) minutes as needed for chest pain. 30 tablet 3  . pantoprazole (PROTONIX) 40 MG tablet Take 1 tablet (40 mg total) by mouth daily. 90 tablet 3  . senna-docusate (SENOKOT-S) 8.6-50 MG per tablet Take 1 tablet by mouth at bedtime as needed for mild constipation. 30 tablet 1  . traMADol (ULTRAM) 50 MG tablet Take 1 tablet (50 mg total) by mouth every 6 (six) hours as needed. 30 tablet 0  . naproxen (NAPROSYN) 500 MG tablet Take 1 tablet (500 mg total) by mouth 2 (two) times daily with a meal. 30 tablet 0   No current facility-administered medications on file prior to visit.    ALLERGIES: Allergies  Allergen Reactions  . Ciprofloxacin Rash    FAMILY HISTORY: Family History  Problem Relation Age of Onset  . Heart disease Mother   . Diabetes Mother   . Cancer Mother     cervical  . Diabetes Father   . Lupus Sister   . Heart failure Sister   . Hepatitis C Sister   . Heart failure Brother   . Diabetes Brother   . Hypertension Daughter     SOCIAL HISTORY: History   Social History  . Marital Status: Divorced    Spouse Name: N/A    Number of Children: N/A  . Years of Education: N/A   Occupational History  . Not on file.   Social History Main Topics  . Smoking status: Current Every Day Smoker -- 1.00 packs/day for 37 years    Types: Cigarettes  . Smokeless tobacco: Never Used     Comment: admits to smoking 2-3 cigarettes/per day  . Alcohol Use: No  . Drug Use: Yes     Comment: smoked pot years ago   . Sexual Activity: No   Other Topics Concern  . Not on file   Social History Narrative    REVIEW OF SYSTEMS: Constitutional: No fevers, chills, or sweats, no generalized fatigue, change in appetite Eyes: No visual changes, double vision, eye pain Ear, nose and throat: No hearing loss, ear pain, nasal congestion, sore throat Cardiovascular: No chest pain, palpitations Respiratory:  No shortness of breath at rest or with exertion, wheezes GastrointestinaI:  No nausea, vomiting, diarrhea, abdominal pain, fecal incontinence Genitourinary:  No dysuria, urinary retention or frequency Musculoskeletal:  No neck pain, back pain Integumentary: No rash, pruritus, skin lesions Neurological: as above Psychiatric: No depression, insomnia, anxiety Endocrine: No palpitations, fatigue, diaphoresis, mood swings, change in appetite, change in weight, increased thirst Hematologic/Lymphatic:  No anemia, purpura, petechiae. Allergic/Immunologic: no itchy/runny eyes, nasal congestion, recent allergic reactions, rashes  PHYSICAL EXAM: Filed Vitals:   02/22/14 1047  BP: 140/82  Pulse: 76  Temp: 99.5  F (37.5 C)  Resp: 18   General: No acute distress Head:  Normocephalic/atraumatic Eyes:  fundi unremarkable, without vessel changes, exudates, hemorrhages or papilledema. Neck: supple, no paraspinal tenderness, full range of motion Back: No paraspinal tenderness Heart: regular rate and rhythm Lungs: Clear to auscultation bilaterally. Vascular: No carotid bruits. Neurological Exam: Mental status: alert and oriented to person, place, and time, recent and remote memory intact, fund of knowledge intact, attention and concentration intact, speech fluent and not dysarthric, language intact. Cranial nerves: CN I: not tested CN II: pupils equal, round and reactive to light, visual fields intact, fundi unremarkable, without vessel changes, exudates, hemorrhages or papilledema. CN III, IV, VI:  full range of motion, no nystagmus, no ptosis CN V: facial sensation intact CN VII: upper and lower face symmetric CN VIII: hearing intact CN IX, X: gag intact, uvula midline CN XI: sternocleidomastoid and trapezius muscles intact CN XII: tongue midline Bulk & Tone: normal, no fasciculations. Motor: 5/5 throughout Sensation: temperature and vibration intact Deep Tendon Reflexes:  2+ throughout, except absent in ankles, toes downgoin. Finger to nose testing:  No  dysmetria Heel to shin: no dysmetria Gait:  Antalgic gait due to back pain.  Difficulty walking in tandem. Romberg negative.  IMPRESSION: TIA.  She describes that the facial droop was more of a "twisting" of her face rather than actual weakness.  This would suggest that it is not a TIA and most likely related to depression.  However, the exam in the records mentions mild facial weakness, so TIA cannot be ruled out. Depression  PLAN: Treat for secondary stroke prevention. 1.  Continue ASA 81mg  daily 2.  Blood pressure control, diabetes control 3.  Continue statin (LDL at goal of less than 70) 4.  Mediterranean diet 5.  Recommend going to behavioral health  6.  Smoking cessation 7.  Follow up in 6 months for re-evaluation  Thank you for allowing me to take part in the care of this patient.  Metta Clines, DO  CC: Angelica Chessman, MD

## 2014-02-22 NOTE — Patient Instructions (Signed)
You could have had a TIA or it could have been related to depression and stress.  We should treat for both. 1.  Continue aspirin, diabetes control, blood pressure control and cholesterol medication 2.  Mediterranean diet 3.  I urge you to go to behavioral health to address the depression. 4.  Follow up in 6 months

## 2014-03-21 ENCOUNTER — Telehealth: Payer: Self-pay | Admitting: Neurology

## 2014-03-21 NOTE — Telephone Encounter (Signed)
I scheduled an appointment for the patient on Monday, 07DEC2015, at 16:00h in the [facility] Department. Patient will see Dr. Leonie Man for the Premature Treatment Discontinuation Visit for the Socrates Trial.

## 2014-03-28 ENCOUNTER — Encounter (INDEPENDENT_AMBULATORY_CARE_PROVIDER_SITE_OTHER): Payer: Self-pay

## 2014-03-28 ENCOUNTER — Telehealth: Payer: Self-pay | Admitting: Neurology

## 2014-03-28 DIAGNOSIS — Z0289 Encounter for other administrative examinations: Secondary | ICD-10-CM

## 2014-03-28 NOTE — Telephone Encounter (Signed)
I talked to the patient to confirm her rescheduled appointment for today [id number] at 16:00h for Premature Treatment Discontinuation Visit - Socrates Research Trial.

## 2014-04-02 ENCOUNTER — Ambulatory Visit (INDEPENDENT_AMBULATORY_CARE_PROVIDER_SITE_OTHER): Payer: Self-pay | Admitting: Neurology

## 2014-04-02 ENCOUNTER — Encounter: Payer: Self-pay | Admitting: Neurology

## 2014-04-02 DIAGNOSIS — G459 Transient cerebral ischemic attack, unspecified: Secondary | ICD-10-CM

## 2014-04-02 NOTE — Progress Notes (Signed)
Pt was seen today for end of study visit. Pt had no neurological deficit or complains. No recurrent neurological symptoms. She experienced headache with the investigational drugs and she stopped the medication by herself at more than 2 months into the study. She was terminated on the trial and put on plavix. Her NIHSS = 0 and mRS = 0. She will follow up with Dr. Tomi Likens at [facility].   Rosalin Hawking, MD PhD Stroke Neurology 04/02/2014 5:27 PM

## 2014-05-01 ENCOUNTER — Telehealth: Payer: Self-pay | Admitting: Internal Medicine

## 2014-05-01 NOTE — Telephone Encounter (Signed)
Patient calling to confirm appointment for "diabetic counseling". Informed patient that system didn't show any future appointments scheduled nor any missed appointments for such type (diabetic counseling). Patient states she spoke with nurse Sharee Pimple in regards to this appointment and that Sharee Pimple may be more familiar with situation. Patient requesting to speak to [place]. Sharee Pimple is out of office today. Informed patient that I will give Sharee Pimple the message and that she could expect a call back within 24-48 hours. Please assist.

## 2014-05-03 ENCOUNTER — Telehealth: Payer: Self-pay | Admitting: Emergency Medicine

## 2014-05-03 NOTE — Telephone Encounter (Signed)
Left message for pt to return message

## 2014-06-27 ENCOUNTER — Ambulatory Visit: Payer: Self-pay | Admitting: Internal Medicine

## 2014-07-08 ENCOUNTER — Encounter: Payer: Self-pay | Admitting: Internal Medicine

## 2014-07-08 ENCOUNTER — Ambulatory Visit: Payer: No Typology Code available for payment source | Attending: Internal Medicine | Admitting: Internal Medicine

## 2014-07-08 VITALS — BP 131/71 | HR 75 | Temp 98.2°F | Resp 20 | Ht 66.0 in | Wt 236.2 lb

## 2014-07-08 DIAGNOSIS — Z720 Tobacco use: Secondary | ICD-10-CM | POA: Insufficient documentation

## 2014-07-08 DIAGNOSIS — Z0000 Encounter for general adult medical examination without abnormal findings: Secondary | ICD-10-CM | POA: Insufficient documentation

## 2014-07-08 DIAGNOSIS — E1169 Type 2 diabetes mellitus with other specified complication: Secondary | ICD-10-CM | POA: Insufficient documentation

## 2014-07-08 DIAGNOSIS — F32A Depression, unspecified: Secondary | ICD-10-CM | POA: Insufficient documentation

## 2014-07-08 DIAGNOSIS — Z794 Long term (current) use of insulin: Secondary | ICD-10-CM | POA: Insufficient documentation

## 2014-07-08 DIAGNOSIS — J069 Acute upper respiratory infection, unspecified: Secondary | ICD-10-CM | POA: Insufficient documentation

## 2014-07-08 DIAGNOSIS — F329 Major depressive disorder, single episode, unspecified: Secondary | ICD-10-CM | POA: Insufficient documentation

## 2014-07-08 DIAGNOSIS — I10 Essential (primary) hypertension: Secondary | ICD-10-CM | POA: Insufficient documentation

## 2014-07-08 DIAGNOSIS — E1165 Type 2 diabetes mellitus with hyperglycemia: Secondary | ICD-10-CM | POA: Insufficient documentation

## 2014-07-08 DIAGNOSIS — F17200 Nicotine dependence, unspecified, uncomplicated: Secondary | ICD-10-CM | POA: Insufficient documentation

## 2014-07-08 DIAGNOSIS — E119 Type 2 diabetes mellitus without complications: Secondary | ICD-10-CM

## 2014-07-08 DIAGNOSIS — E785 Hyperlipidemia, unspecified: Secondary | ICD-10-CM | POA: Insufficient documentation

## 2014-07-08 LAB — GLUCOSE, POCT (MANUAL RESULT ENTRY): POC Glucose: 158 mg/dl — AB (ref 70–99)

## 2014-07-08 LAB — POCT GLYCOSYLATED HEMOGLOBIN (HGB A1C): HEMOGLOBIN A1C: 8.7

## 2014-07-08 MED ORDER — BUPROPION HCL 100 MG PO TABS: 100.0000 mg | ORAL_TABLET | Freq: Two times a day (BID) | ORAL | Status: DC

## 2014-07-08 MED ORDER — AMOXICILLIN-POT CLAVULANATE 875-125 MG PO TABS: 1.0000 | ORAL_TABLET | Freq: Two times a day (BID) | ORAL | Status: DC

## 2014-07-08 MED ORDER — GABAPENTIN 100 MG PO CAPS: 100.0000 mg | ORAL_CAPSULE | Freq: Every day | ORAL | Status: DC

## 2014-07-08 MED ORDER — METOPROLOL TARTRATE 50 MG PO TABS
50.0000 mg | ORAL_TABLET | Freq: Two times a day (BID) | ORAL | Status: DC
Start: 2014-07-08 — End: 2015-09-22

## 2014-07-08 MED ORDER — METFORMIN HCL ER 500 MG PO TB24: 1000.0000 mg | ORAL_TABLET | Freq: Two times a day (BID) | ORAL | Status: DC

## 2014-07-08 MED ORDER — LISINOPRIL 10 MG PO TABS: 10.0000 mg | ORAL_TABLET | Freq: Every day | ORAL | Status: DC

## 2014-07-08 MED ORDER — HYDROCHLOROTHIAZIDE 12.5 MG PO TABS: 12.5000 mg | ORAL_TABLET | Freq: Every day | ORAL | Status: DC

## 2014-07-08 MED ORDER — PANTOPRAZOLE SODIUM 40 MG PO TBEC: 40.0000 mg | DELAYED_RELEASE_TABLET | Freq: Every day | ORAL | Status: DC

## 2014-07-08 MED ORDER — INSULIN GLARGINE 300 UNIT/ML ~~LOC~~ SOPN: 16.0000 [IU] | PEN_INJECTOR | Freq: Every day | SUBCUTANEOUS | Status: DC

## 2014-07-08 MED ORDER — ATORVASTATIN CALCIUM 10 MG PO TABS: 10.0000 mg | ORAL_TABLET | Freq: Every day | ORAL | Status: DC

## 2014-07-08 NOTE — Patient Instructions (Signed)
Bupropion sustained-release tablets (smoking cessation) What is this medicine? BUPROPION (byoo PROE pee on) is used to help people quit smoking. This medicine may be used for other purposes; ask your health care provider or pharmacist if you have questions. COMMON BRAND NAME(S): Buproban, Zyban What should I tell my health care provider before I take this medicine? They need to know if you have any of these conditions: -an eating disorder, such as anorexia or bulimia -bipolar disorder or psychosis -diabetes or high blood sugar, treated with medication -glaucoma -head injury or brain tumor -heart disease, previous heart attack, or irregular heart beat -high blood pressure -kidney or liver disease -seizures -suicidal thoughts or a previous suicide attempt -Tourette's syndrome -weight loss -an unusual or allergic reaction to bupropion, other medicines, foods, dyes, or preservatives -breast-feeding -pregnant or trying to become pregnant How should I use this medicine? Take this medicine by mouth with a glass of water. Follow the directions on the prescription label. You can take it with or without food. If it upsets your stomach, take it with food. Do not cut, crush or chew this medicine. Take your medicine at regular intervals. If you take this medicine more than once a day, take your second dose at least 8 hours after you take your first dose. To limit difficulty in sleeping, avoid taking this medicine at bedtime. Do not take your medicine more often than directed. Do not stop taking this medicine suddenly except upon the advice of your doctor. Stopping this medicine too quickly may cause serious side effects. A special MedGuide will be given to you by the pharmacist with each prescription and refill. Be sure to read this information carefully each time. Talk to your pediatrician regarding the use of this medicine in children. Special care may be needed. Overdosage: If you think you have  taken too much of this medicine contact a poison control center or emergency room at once. NOTE: This medicine is only for you. Do not share this medicine with others. What if I miss a dose? If you miss a dose, skip the missed dose and take your next tablet at the regular time. There should be at least 8 hours between doses. Do not take double or extra doses. What may interact with this medicine? Do not take this medicine with any of the following medications: -linezolid -MAOIs like Azilect, Carbex, Eldepryl, Marplan, Nardil, and Parnate -methylene blue (injected into a vein) -other medicines that contain bupropion like Wellbutrin This medicine may also interact with the following medications: -alcohol -certain medicines for anxiety or sleep -certain medicines for blood pressure like metoprolol, propranolol -certain medicines for depression or psychotic disturbances -certain medicines for HIV or AIDS like efavirenz, lopinavir, nelfinavir, ritonavir -certain medicines for irregular heart beat like propafenone, flecainide -certain medicines for Parkinson's disease like amantadine, levodopa -certain medicines for seizures like carbamazepine, phenytoin, phenobarbital -cimetidine -clopidogrel -cyclophosphamide -furazolidone -isoniazid -nicotine -orphenadrine -procarbazine -steroid medicines like prednisone or cortisone -stimulant medicines for attention disorders, weight loss, or to stay awake -tamoxifen -theophylline -thiotepa -ticlopidine -tramadol -warfarin This list may not describe all possible interactions. Give your health care provider a list of all the medicines, herbs, non-prescription drugs, or dietary supplements you use. Also tell them if you smoke, drink alcohol, or use illegal drugs. Some items may interact with your medicine. What should I watch for while using this medicine? Visit your doctor or health care professional for regular checks on your progress. This  medicine should be used together with a patient   support program. It is important to participate in a behavioral program, counseling, or other support program that is recommended by your health care professional. Patients and their families should watch out for new or worsening thoughts of suicide or depression. Also watch out for sudden changes in feelings such as feeling anxious, agitated, panicky, irritable, hostile, aggressive, impulsive, severely restless, overly excited and hyperactive, or not being able to sleep. If this happens, especially at the beginning of treatment or after a change in dose, call your health care professional. Avoid alcoholic drinks while taking this medicine. Drinking excessive alcoholic beverages, using sleeping or anxiety medicines, or quickly stopping the use of these agents while taking this medicine may increase your risk for a seizure. Do not drive or use heavy machinery until you know how this medicine affects you. This medicine can impair your ability to perform these tasks. Do not take this medicine close to bedtime. It may prevent you from sleeping. Your mouth may get dry. Chewing sugarless gum or sucking hard candy, and drinking plenty of water may help. Contact your doctor if the problem does not go away or is severe. Do not use nicotine patches or chewing gum without the advice of your doctor or health care professional while taking this medicine. You may need to have your blood pressure taken regularly if your doctor recommends that you use both nicotine and this medicine together. What side effects may I notice from receiving this medicine? Side effects that you should report to your doctor or health care professional as soon as possible: -allergic reactions like skin rash, itching or hives, swelling of the face, lips, or tongue -breathing problems -changes in vision -confusion -fast or irregular heartbeat -hallucinations -increased blood pressure -redness,  blistering, peeling or loosening of the skin, including inside the mouth -seizures -suicidal thoughts or other mood changes -unusually weak or tired -vomiting Side effects that usually do not require medical attention (report to your doctor or health care professional if they continue or are bothersome): -change in sex drive or performance -constipation -headache -loss of appetite -nausea -tremors -weight loss This list may not describe all possible side effects. Call your doctor for medical advice about side effects. You may report side effects to FDA at 1-800-FDA-1088. Where should I keep my medicine? Keep out of the reach of children. Store at room temperature between 20 and 25 degrees C (68 and 77 degrees F). Protect from light. Keep container tightly closed. Throw away any unused medicine after the expiration date. NOTE: This sheet is a summary. It may not cover all possible information. If you have questions about this medicine, talk to your doctor, pharmacist, or health care provider.  2015, [employer]. (2012-12-01 10:55:10) Smoking Cessation Quitting smoking is important to your health and has many advantages. However, it is not always easy to quit since nicotine is a very addictive drug. Oftentimes, people try 3 times or more before being able to quit. This document explains the best ways for you to prepare to quit smoking. Quitting takes hard work and a lot of effort, but you can do it. ADVANTAGES OF QUITTING SMOKING  You will live longer, feel better, and live better.  Your body will feel the impact of quitting smoking almost immediately.  Within 20 minutes, blood pressure decreases. Your pulse returns to its normal level.  After 8 hours, carbon monoxide levels in the blood return to normal. Your oxygen level increases.  After 24 hours, the chance of having a heart  attack starts to decrease. Your breath, hair, and body stop smelling like smoke.  After 48  hours, damaged nerve endings begin to recover. Your sense of taste and smell improve.  After 72 hours, the body is virtually free of nicotine. Your bronchial tubes relax and breathing becomes easier.  After 2 to 12 weeks, lungs can hold more air. Exercise becomes easier and circulation improves.  The risk of having a heart attack, stroke, cancer, or lung disease is greatly reduced.  After 1 year, the risk of coronary heart disease is cut in half.  After 5 years, the risk of stroke falls to the same as a nonsmoker.  After 10 years, the risk of lung cancer is cut in half and the risk of other cancers decreases significantly.  After 15 years, the risk of coronary heart disease drops, usually to the level of a nonsmoker.  If you are pregnant, quitting smoking will improve your chances of having a healthy baby.  The people you live with, especially any children, will be healthier.  You will have extra money to spend on things other than cigarettes. QUESTIONS TO THINK ABOUT BEFORE ATTEMPTING TO QUIT You may want to talk about your answers with your health care provider.  Why do you want to quit?  If you tried to quit in the past, what helped and what did not?  What will be the most difficult situations for you after you quit? How will you plan to handle them?  Who can help you through the tough times? Your family? Friends? A health care provider?  What pleasures do you get from smoking? What ways can you still get pleasure if you quit? Here are some questions to ask your health care provider:  How can you help me to be successful at quitting?  What medicine do you think would be best for me and how should I take it?  What should I do if I need more help?  What is smoking withdrawal like? How can I get information on withdrawal? GET READY  Set a quit date.  Change your environment by getting rid of all cigarettes, ashtrays, matches, and lighters in your home, car, or work. Do  not let people smoke in your home.  Review your past attempts to quit. Think about what worked and what did not. GET SUPPORT AND ENCOURAGEMENT You have a better chance of being successful if you have help. You can get support in many ways.  Tell your family, friends, and coworkers that you are going to quit and need their support. Ask them not to smoke around you.  Get individual, group, or telephone counseling and support. Programs are available at [employer] and health centers. Call your local health department for information about programs in your area.  Spiritual beliefs and practices may help some smokers quit.  Download a "quit meter" on your computer to keep track of quit statistics, such as how long you have gone without smoking, cigarettes not smoked, and money saved.  Get a self-help book about quitting smoking and staying off tobacco. Calvin yourself from urges to smoke. Talk to someone, go for a walk, or occupy your time with a task.  Change your normal routine. Take a different route to work. Drink tea instead of coffee. Eat breakfast in a different place.  Reduce your stress. Take a hot bath, exercise, or read a book.  Plan something enjoyable to do every day. Reward yourself  for not smoking.  Explore interactive web-based programs that specialize in helping you quit. GET MEDICINE AND USE IT CORRECTLY Medicines can help you stop smoking and decrease the urge to smoke. Combining medicine with the above behavioral methods and support can greatly increase your chances of successfully quitting smoking.  Nicotine replacement therapy helps deliver nicotine to your body without the negative effects and risks of smoking. Nicotine replacement therapy includes nicotine gum, lozenges, inhalers, nasal sprays, and skin patches. Some may be available over-the-counter and others require a prescription.  Antidepressant medicine helps people abstain  from smoking, but how this works is unknown. This medicine is available by prescription.  Nicotinic receptor partial agonist medicine simulates the effect of nicotine in your brain. This medicine is available by prescription. Ask your health care provider for advice about which medicines to use and how to use them based on your health history. Your health care provider will tell you what side effects to look out for if you choose to be on a medicine or therapy. Carefully read the information on the package. Do not use any other product containing nicotine while using a nicotine replacement product.  RELAPSE OR DIFFICULT SITUATIONS Most relapses occur within the first 3 months after quitting. Do not be discouraged if you start smoking again. Remember, most people try several times before finally quitting. You may have symptoms of withdrawal because your body is used to nicotine. You may crave cigarettes, be irritable, feel very hungry, cough often, get headaches, or have difficulty concentrating. The withdrawal symptoms are only temporary. They are strongest when you first quit, but they will go away within 10-14 days. To reduce the chances of relapse, try to:  Avoid drinking alcohol. Drinking lowers your chances of successfully quitting.  Reduce the amount of caffeine you consume. Once you quit smoking, the amount of caffeine in your body increases and can give you symptoms, such as a rapid heartbeat, sweating, and anxiety.  Avoid smokers because they can make you want to smoke.  Do not let weight gain distract you. Many smokers will gain weight when they quit, usually less than 10 pounds. Eat a healthy diet and stay active. You can always lose the weight gained after you quit.  Find ways to improve your mood other than smoking. FOR MORE INFORMATION  www.smokefree.gov  Document Released: 03/30/2001 Document Revised: 08/20/2013 Document Reviewed: 07/15/2011 [facility] Patient Information 2015  [place], [place]. This information is not intended to replace advice given to you by your health care provider. Make sure you discuss any questions you have with your health care provider. Upper Respiratory Infection, Adult An upper respiratory infection (URI) is also sometimes known as the common cold. The upper respiratory tract includes the nose, sinuses, throat, trachea, and bronchi. Bronchi are the airways leading to the lungs. Most people improve within 1 week, but symptoms can last up to 2 weeks. A residual cough may last even longer.  CAUSES Many different viruses can infect the tissues lining the upper respiratory tract. The tissues become irritated and inflamed and often become very moist. Mucus production is also common. A cold is contagious. You can easily spread the virus to others by oral contact. This includes kissing, sharing a glass, coughing, or sneezing. Touching your mouth or nose and then touching a surface, which is then touched by another person, can also spread the virus. SYMPTOMS  Symptoms typically develop 1 to 3 days after you come in contact with a cold virus. Symptoms vary from  person to person. They may include:  Runny nose.  Sneezing.  Nasal congestion.  Sinus irritation.  Sore throat.  Loss of voice (laryngitis).  Cough.  Fatigue.  Muscle aches.  Loss of appetite.  Headache.  Low-grade fever. DIAGNOSIS  You might diagnose your own cold based on familiar symptoms, since most people get a cold 2 to 3 times a year. Your caregiver can confirm this based on your exam. Most importantly, your caregiver can check that your symptoms are not due to another disease such as strep throat, sinusitis, pneumonia, asthma, or epiglottitis. Blood tests, throat tests, and X-rays are not necessary to diagnose a common cold, but they may sometimes be helpful in excluding other more serious diseases. Your caregiver will decide if any further tests are required. RISKS AND  COMPLICATIONS  You may be at risk for a more severe case of the common cold if you smoke cigarettes, have chronic heart disease (such as heart failure) or lung disease (such as asthma), or if you have a weakened immune system. The very young and very old are also at risk for more serious infections. Bacterial sinusitis, middle ear infections, and bacterial pneumonia can complicate the common cold. The common cold can worsen asthma and chronic obstructive pulmonary disease (COPD). Sometimes, these complications can require emergency medical care and may be life-threatening. PREVENTION  The best way to protect against getting a cold is to practice good hygiene. Avoid oral or hand contact with people with cold symptoms. Wash your hands often if contact occurs. There is no clear evidence that vitamin C, vitamin E, echinacea, or exercise reduces the chance of developing a cold. However, it is always recommended to get plenty of rest and practice good nutrition. TREATMENT  Treatment is directed at relieving symptoms. There is no cure. Antibiotics are not effective, because the infection is caused by a virus, not by bacteria. Treatment may include:  Increased fluid intake. Sports drinks offer valuable electrolytes, sugars, and fluids.  Breathing heated mist or steam (vaporizer or shower).  Eating chicken soup or other clear broths, and maintaining good nutrition.  Getting plenty of rest.  Using gargles or lozenges for comfort.  Controlling fevers with ibuprofen or acetaminophen as directed by your caregiver.  Increasing usage of your inhaler if you have asthma. Zinc gel and zinc lozenges, taken in the first 24 hours of the common cold, can shorten the duration and lessen the severity of symptoms. Pain medicines may help with fever, muscle aches, and throat pain. A variety of non-prescription medicines are available to treat congestion and runny nose. Your caregiver can make recommendations and may  suggest nasal or lung inhalers for other symptoms.  HOME CARE INSTRUCTIONS   Only take over-the-counter or prescription medicines for pain, discomfort, or fever as directed by your caregiver.  Use a warm mist humidifier or inhale steam from a shower to increase air moisture. This may keep secretions moist and make it easier to breathe.  Drink enough water and fluids to keep your urine clear or pale yellow.  Rest as needed.  Return to work when your temperature has returned to normal or as your caregiver advises. You may need to stay home longer to avoid infecting others. You can also use a face mask and careful hand washing to prevent spread of the virus. SEEK MEDICAL CARE IF:   After the first few days, you feel you are getting worse rather than better.  You need your caregiver's advice about medicines to control  symptoms.  You develop chills, worsening shortness of breath, or brown or red sputum. These may be signs of pneumonia.  You develop yellow or brown nasal discharge or pain in the face, especially when you bend forward. These may be signs of sinusitis.  You develop a fever, swollen neck glands, pain with swallowing, or white areas in the back of your throat. These may be signs of strep throat. SEEK IMMEDIATE MEDICAL CARE IF:   You have a fever.  You develop severe or persistent headache, ear pain, sinus pain, or chest pain.  You develop wheezing, a prolonged cough, cough up blood, or have a change in your usual mucus (if you have chronic lung disease).  You develop sore muscles or a stiff neck. Document Released: 09/29/2000 Document Revised: 06/28/2011 Document Reviewed: 07/11/2013 [facility] Patient Information 2015 [place], [place]. This information is not intended to replace advice given to you by your health care provider. Make sure you discuss any questions you have with your health care provider.

## 2014-07-08 NOTE — Progress Notes (Signed)
Patient ID: Deborah Coffey, female   DOB: Aug 04, 1955, 59 y.o.   MRN: [id number]   Deborah Coffey, is a 59 y.o. female  [id number]  [id number]  DOB - February 11, 1956  Chief Complaint  Patient presents with  . Diabetes        Subjective:   Deborah Coffey is a 59 y.o. female here today for a follow up visit. Patient has history of hypertension, diabetes mellitus type 2, hyperlipidemia and major depression presents today for routine follow-up. She complains of two-week history of cough that is productive of whitish-yellow sputum, associated with chills, nasal congestion and whitish nasal discharge. She also has epigastric pain that is nonradiating, rated at 5 out of 10, may be cough related. She has chronic low back pain, requesting pain medication. Patient continues to smoke cigarettes about half a pack of cigarettes per day. She is trying to quit, requesting medication. Patient has No headache, No chest pain, No abdominal pain - No Nausea, No new weakness tingling or numbness, No Cough - SOB. Patient is due for mammogram and colonoscopy.  Problem  Type 2 Diabetes Mellitus With Hyperglycemia  Preventative Health Care  Depression  Acute Upper Respiratory Infection  Smoking Addiction    ALLERGIES: Allergies  Allergen Reactions  . Ciprofloxacin Rash    PAST MEDICAL HISTORY: Past Medical History  Diagnosis Date  . CAD in native artery 02/2004    Mid LAD lesoin just after Large D1 --> CABG x 1 LIMA-LAD  . 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 competitive flow  . Hypertension   . High cholesterol   . Type II diabetes mellitus   . Anemia   . History of blood transfusion 2005    "related to OHS"   . Allergy     MEDICATIONS AT HOME: Prior to Admission medications   Medication Sig Start Date End Date Taking? Authorizing Provider  aspirin 81 MG tablet Take 1 tablet (81 mg total) by mouth daily. 02/14/13  Yes  Ripudeep Krystal Eaton, MD  atorvastatin (LIPITOR) 10 MG tablet Take 1 tablet (10 mg total) by mouth daily. 07/08/14  Yes Tresa Garter, MD  gabapentin (NEURONTIN) 100 MG capsule Take 1 capsule (100 mg total) by mouth daily. 07/08/14  Yes Tresa Garter, MD  hydrochlorothiazide (HYDRODIURIL) 12.5 MG tablet Take 1 tablet (12.5 mg total) by mouth daily. 07/08/14  Yes Tresa Garter, MD  Insulin Glargine (TOUJEO SOLOSTAR) 300 UNIT/ML SOPN Inject 16 Units into the skin daily with breakfast. 07/08/14  Yes Tresa Garter, MD  lisinopril (PRINIVIL,ZESTRIL) 10 MG tablet Take 1 tablet (10 mg total) by mouth daily. 07/08/14  Yes Tresa Garter, MD  metFORMIN (GLUCOPHAGE XR) 500 MG 24 hr tablet Take 2 tablets (1,000 mg total) by mouth 2 (two) times daily with a meal. 07/08/14  Yes Virgil Slinger E Doreene Burke, MD  metoprolol (LOPRESSOR) 50 MG tablet Take 1 tablet (50 mg total) by mouth 2 (two) times daily. 07/08/14  Yes Tresa Garter, MD  senna-docusate (SENOKOT-S) 8.6-50 MG per tablet Take 1 tablet by mouth at bedtime as needed for mild constipation. 03/23/13  Yes Ripudeep Krystal Eaton, MD  amoxicillin-clavulanate (AUGMENTIN) 875-125 MG per tablet Take 1 tablet by mouth 2 (two) times daily. 07/08/14   Tresa Garter, MD  buPROPion (WELLBUTRIN) 100 MG tablet Take 1 tablet (100 mg total) by mouth 2 (two) times daily. 07/08/14   Tresa Garter, MD  naproxen (NAPROSYN)  500 MG tablet Take 1 tablet (500 mg total) by mouth 2 (two) times daily with a meal. Patient not taking: Reported on 07/08/2014 03/19/13   Linton Flemings, MD  nicotine (NICODERM CQ) 21 mg/24hr patch Place 1 patch (21 mg total) onto the skin daily. Patient not taking: Reported on 07/08/2014 01/03/14   Karlene Einstein, MD  nitroGLYCERIN (NITROSTAT) 0.4 MG SL tablet Place 1 tablet (0.4 mg total) under the tongue every 5 (five) minutes as needed for chest pain. Patient not taking: Reported on 07/08/2014 03/23/13   Ripudeep Krystal Eaton, MD  pantoprazole  (PROTONIX) 40 MG tablet Take 1 tablet (40 mg total) by mouth daily. 07/08/14   Tresa Garter, MD  traMADol (ULTRAM) 50 MG tablet Take 1 tablet (50 mg total) by mouth every 6 (six) hours as needed. Patient not taking: Reported on 07/08/2014 03/23/13   Ripudeep Krystal Eaton, MD     Objective:   Filed Vitals:   07/08/14 1054  BP: 131/71  Pulse: 75  Temp: 98.2 F (36.8 C)  TempSrc: Oral  Resp: 20  Height: 5\' 6"  (1.676 m)  Weight: 236 lb 3.2 oz (107.14 kg)  SpO2: 100%    Exam General appearance : Awake, alert, not in any distress. Speech Clear. Not toxic looking HEENT: Atraumatic and Normocephalic, pupils equally reactive to light and accomodation Neck: supple, no JVD. No cervical lymphadenopathy.  Chest:Good air entry bilaterally, no added sounds  CVS: S1 S2 regular, no murmurs.  Abdomen: Bowel sounds present, Non tender and not distended with no gaurding, rigidity or rebound. Extremities: B/L Lower Ext shows no edema, both legs are warm to touch Neurology: Awake alert, and oriented X 3, CN II-XII intact, Non focal Skin:No Rash  Data Review Lab Results  Component Value Date   HGBA1C 8.7 07/08/2014   HGBA1C 12.9* 01/02/2014   HGBA1C 13.1* 01/01/2014     Assessment & Plan   1. Type 2 diabetes mellitus with hyperglycemia  - Glucose (CBG) - HgB A1c has come down to 8.7% today  Aim for 30 minutes of exercise most days. Rethink what you drink. Water is great! Aim for 2-3 Carb Choices per meal (30-45 grams) +/- 1 either way  Aim for 0-15 Carbs per snack if hungry  Include protein in moderation with your meals and snacks  Consider reading food labels for Total Carbohydrate and Fat Grams of foods  Consider checking BG at alternate times per day  Continue taking medication as directed Be mindful about how much sugar you are adding to beverages and other foods. Fruit Punch - find one with no sugar  Measure and decrease portions of carbohydrate foods  Make your plate and  don't go back for seconds   2. Essential hypertension  - hydrochlorothiazide (HYDRODIURIL) 12.5 MG tablet; Take 1 tablet (12.5 mg total) by mouth daily.  Dispense: 90 tablet; Refill: 3 - lisinopril (PRINIVIL,ZESTRIL) 10 MG tablet; Take 1 tablet (10 mg total) by mouth daily.  Dispense: 90 tablet; Refill: 3 - metoprolol (LOPRESSOR) 50 MG tablet; Take 1 tablet (50 mg total) by mouth 2 (two) times daily.  Dispense: 180 tablet; Refill: 3  Refill - pantoprazole (PROTONIX) 40 MG tablet; Take 1 tablet (40 mg total) by mouth daily.  Dispense: 90 tablet; Refill: 3  We have discussed target BP range and blood pressure goal. I have advised patient to check BP regularly and to call us back or report to clinic if the numbers are consistently higher than 140/90. We discussed the  importance of compliance with medical therapy and DASH diet recommended, consequences of uncontrolled hypertension discussed.  - continue current BP medications   3. Type 2 diabetes mellitus without complication  - gabapentin (NEURONTIN) 100 MG capsule; Take 1 capsule (100 mg total) by mouth daily.  Dispense: 90 capsule; Refill: 3 - Insulin Glargine (TOUJEO SOLOSTAR) 300 UNIT/ML SOPN; Inject 16 Units into the skin daily with breakfast.  Dispense: 3 pen; Refill: 3 - metFORMIN (GLUCOPHAGE XR) 500 MG 24 hr tablet; Take 2 tablets (1,000 mg total) by mouth 2 (two) times daily with a meal.  Dispense: 180 tablet; Refill: 3  4. Dyslipidemia  - atorvastatin (LIPITOR) 10 MG tablet; Take 1 tablet (10 mg total) by mouth daily.  Dispense: 90 tablet; Refill: 3  To address this please limit saturated fat to no more than 7% of your calories, limit cholesterol to 200 mg/day, increase fiber and exercise as tolerated. If needed we may add another cholesterol lowering medication to your regimen.   5. Preventative health care  - MM Digital Screening; Future - HM COLONOSCOPY - Ambulatory referral to Gastroenterology  6. Depression  - buPROPion  (WELLBUTRIN) 100 MG tablet; Take 1 tablet (100 mg total) by mouth 2 (two) times daily.  Dispense: 60 tablet; Refill: 3  7. Smoking addiction  - buPROPion (WELLBUTRIN) 100 MG tablet; Take 1 tablet (100 mg total) by mouth 2 (two) times daily.  Dispense: 60 tablet; Refill: 3  Deborah was counseled on the dangers of tobacco use, and was advised to quit. Reviewed strategies to maximize success, including removing cigarettes and smoking materials from environment, stress management and support of family/friends.   8. Acute upper respiratory infection  - amoxicillin-clavulanate (AUGMENTIN) 875-125 MG per tablet; Take 1 tablet by mouth 2 (two) times daily.  Dispense: 14 tablet; Refill: 0   Patient have been counseled extensively about nutrition and exercise Return in about 3 months (around 10/08/2014), or if symptoms worsen or fail to improve, for Hemoglobin A1C and Follow up, DM, Follow up HTN.  The patient was given clear instructions to go to ER or return to medical center if symptoms don't improve, worsen or new problems develop. The patient verbalized understanding. The patient was told to call to get lab results if they haven't heard anything in the next week.   This note has been created with Surveyor, quantity. Any transcriptional errors are unintentional.    Angelica Chessman, MD, [place], [place], [place], [place] and [place], [place]   07/08/2014, 12:09 PM

## 2014-07-08 NOTE — Progress Notes (Signed)
Subjective:     Patient ID: Deborah Coffey, female   DOB: 07-23-1955, 59 y.o.   MRN: [id number]  HPI  Deborah Coffey is a 59yo woman here for a follow up visit today for diabetes. Both her A1C and CBG levels are well-controlled. Her hemoglobin A1C today, 07/08/14, is 8.7, reduced from an A1C of 12.9 on  01/02/14. Her CBG today, 07/08/14, is 158 mg/dL, reduced from a CBG of 293 mg/dL on 01/10/14.  She also presents with a 2 week history of cough and congestion. She reports feeling like she had a fever around 2 weeks ago and at the same time developed a cough productive of clear to faintly yellow sputum. The cough is constant throughout the day and sometimes makes her feel like her "chest is tightened". She has also been having intermittant mild frontal and periorbital sinus headaches. She has not taken any medication to relieve her symptoms. The congestion and headache have improved gradually over the last week, but the severity of the cough remains about the same. She has no known allergies, but her eyes and sinuses intermittantly feel itchy and irritated.  Past Medical History  Diagnosis Date  . CAD in native artery 02/2004    Mid LAD lesoin just after Large D1 --> CABG x 1 LIMA-LAD  . 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 competitive flow  . Hypertension   . High cholesterol   . Type II diabetes mellitus   . Anemia   . History of blood transfusion 2005    "related to OHS"   . Allergy      Medication List       This list is accurate as of: 07/08/14  1:31 PM.  Always use your most recent med list.               amoxicillin-clavulanate 875-125 MG per tablet  Commonly known as:  AUGMENTIN  Take 1 tablet by mouth 2 (two) times daily.     aspirin 81 MG tablet  Take 1 tablet (81 mg total) by mouth daily.     atorvastatin 10 MG tablet  Commonly known as:  LIPITOR  Take 1 tablet (10 mg total) by mouth daily.     buPROPion 100 MG  tablet  Commonly known as:  WELLBUTRIN  Take 1 tablet (100 mg total) by mouth 2 (two) times daily.     gabapentin 100 MG capsule  Commonly known as:  NEURONTIN  Take 1 capsule (100 mg total) by mouth daily.     hydrochlorothiazide 12.5 MG tablet  Commonly known as:  HYDRODIURIL  Take 1 tablet (12.5 mg total) by mouth daily.     Insulin Glargine 300 UNIT/ML Sopn  Commonly known as:  TOUJEO SOLOSTAR  Inject 16 Units into the skin daily with breakfast.     lisinopril 10 MG tablet  Commonly known as:  PRINIVIL,ZESTRIL  Take 1 tablet (10 mg total) by mouth daily.     metFORMIN 500 MG 24 hr tablet  Commonly known as:  GLUCOPHAGE XR  Take 2 tablets (1,000 mg total) by mouth 2 (two) times daily with a meal.     metoprolol 50 MG tablet  Commonly known as:  LOPRESSOR  Take 1 tablet (50 mg total) by mouth 2 (two) times daily.     naproxen 500 MG tablet  Commonly known as:  NAPROSYN  Take 1 tablet (500 mg total) by mouth 2 (two)  times daily with a meal.     nicotine 21 mg/24hr patch  Commonly known as:  NICODERM CQ  Place 1 patch (21 mg total) onto the skin daily.     nitroGLYCERIN 0.4 MG SL tablet  Commonly known as:  NITROSTAT  Place 1 tablet (0.4 mg total) under the tongue every 5 (five) minutes as needed for chest pain.     pantoprazole 40 MG tablet  Commonly known as:  PROTONIX  Take 1 tablet (40 mg total) by mouth daily.     senna-docusate 8.6-50 MG per tablet  Commonly known as:  Senokot-S  Take 1 tablet by mouth at bedtime as needed for mild constipation.     traMADol 50 MG tablet  Commonly known as:  ULTRAM  Take 1 tablet (50 mg total) by mouth every 6 (six) hours as needed.        Review of Systems  HENT: Positive for congestion and sinus pressure.   Respiratory: Positive for cough and chest tightness. Negative for shortness of breath.   Cardiovascular: Negative for palpitations.  Gastrointestinal: Positive for nausea.       Objective:   Physical Exam   Constitutional: She is well-developed, well-nourished, and in no distress.  Cardiovascular: Normal rate, regular rhythm and normal heart sounds.   Pulmonary/Chest: Effort normal and breath sounds normal.    Filed Vitals:   07/08/14 1054  BP: 131/71  Pulse: 75  Temp: 98.2 F (36.8 C)  Resp: 20   Physical Exam  Constitutional: She is well-developed, well-nourished, and in no distress.  Cardiovascular: Normal rate, regular rhythm and normal heart sounds.   Pulmonary/Chest: Effort normal and breath sounds normal.        Assessment:     Deborah Coffey is a 59yo female here for a follow up visit for her diabetes. Her diabetes appears to be well-controlled. She also presents with a 2 week history of cough and congestion. Her symptoms are consistent with an acute upper respiratory infection.     Plan:     Deborah Coffey was seen today for diabetes.  Diagnoses and all orders for this visit:  Type 2 diabetes mellitus with hyperglycemia Orders: -     Glucose (CBG) -     HgB A1c  Essential hypertension Orders: -     hydrochlorothiazide (HYDRODIURIL) 12.5 MG tablet; Take 1 tablet (12.5 mg total) by mouth daily. -     lisinopril (PRINIVIL,ZESTRIL) 10 MG tablet; Take 1 tablet (10 mg total) by mouth daily. -     metoprolol (LOPRESSOR) 50 MG tablet; Take 1 tablet (50 mg total) by mouth 2 (two) times daily. -     pantoprazole (PROTONIX) 40 MG tablet; Take 1 tablet (40 mg total) by mouth daily.  Depression (emotion)  Type 2 diabetes mellitus without complication Orders: -     gabapentin (NEURONTIN) 100 MG capsule; Take 1 capsule (100 mg total) by mouth daily. -     Insulin Glargine (TOUJEO SOLOSTAR) 300 UNIT/ML SOPN; Inject 16 Units into the skin daily with breakfast. -     metFORMIN (GLUCOPHAGE XR) 500 MG 24 hr tablet; Take 2 tablets (1,000 mg total) by mouth 2 (two) times daily with a meal.  Dyslipidemia Orders: -     atorvastatin (LIPITOR) 10 MG tablet; Take 1 tablet (10 mg total) by  mouth daily.  Preventative health care Orders: -     MM Digital Screening; Future -     HM COLONOSCOPY -     Ambulatory referral  to Gastroenterology  Depression Orders: -     buPROPion (WELLBUTRIN) 100 MG tablet; Take 1 tablet (100 mg total) by mouth 2 (two) times daily.  Smoking addiction Orders: -     buPROPion (WELLBUTRIN) 100 MG tablet; Take 1 tablet (100 mg total) by mouth 2 (two) times daily.  Acute upper respiratory infection Orders: -     amoxicillin-clavulanate (AUGMENTIN) 875-125 MG per tablet; Take 1 tablet by mouth 2 (two) times daily.   Evaluation and management procedures were performed by me with Medical Student in attendance, note written by medical student under my supervision and collaboration. I have reviewed the note and I agree with the management and plan.   Angelica Chessman, MD, [place], [place], [place], [place] and [facility] [place], [place]

## 2014-07-08 NOTE — Progress Notes (Signed)
Patient presents for f/u on T2DM and HTN and hyperlipidemia C/o 2 week hx cough, productive of whitish-yellow sputum, chills, nasal congestion, white nasal drainage. States having non-radiating chest pain since yesterday; rates 5/10 at present. Thinks it's from cough C/o chronic low back pain; rates 10/10 at present Smoking .5 ppd; states trying to quit

## 2014-08-05 ENCOUNTER — Ambulatory Visit: Payer: Self-pay

## 2014-08-07 ENCOUNTER — Emergency Department (HOSPITAL_COMMUNITY): Payer: Medicaid Other

## 2014-08-07 ENCOUNTER — Ambulatory Visit (HOSPITAL_COMMUNITY): Payer: No Typology Code available for payment source

## 2014-08-07 ENCOUNTER — Inpatient Hospital Stay (HOSPITAL_COMMUNITY)
Admission: EM | Admit: 2014-08-07 | Discharge: 2014-08-10 | DRG: 682 | Disposition: A | Discharge status: Home / Self Care | Payer: Medicaid Other | Attending: Internal Medicine | Admitting: Internal Medicine

## 2014-08-07 ENCOUNTER — Encounter (HOSPITAL_COMMUNITY): Payer: Self-pay

## 2014-08-07 DIAGNOSIS — Z951 Presence of aortocoronary bypass graft: Secondary | ICD-10-CM | POA: Diagnosis not present

## 2014-08-07 DIAGNOSIS — J189 Pneumonia, unspecified organism: Secondary | ICD-10-CM | POA: Diagnosis present

## 2014-08-07 DIAGNOSIS — I10 Essential (primary) hypertension: Secondary | ICD-10-CM | POA: Diagnosis present

## 2014-08-07 DIAGNOSIS — R509 Fever, unspecified: Secondary | ICD-10-CM | POA: Diagnosis present

## 2014-08-07 DIAGNOSIS — Z9049 Acquired absence of other specified parts of digestive tract: Secondary | ICD-10-CM | POA: Diagnosis present

## 2014-08-07 DIAGNOSIS — R112 Nausea with vomiting, unspecified: Secondary | ICD-10-CM

## 2014-08-07 DIAGNOSIS — Z6838 Body mass index (BMI) 38.0-38.9, adult: Secondary | ICD-10-CM

## 2014-08-07 DIAGNOSIS — E785 Hyperlipidemia, unspecified: Secondary | ICD-10-CM | POA: Diagnosis present

## 2014-08-07 DIAGNOSIS — E1165 Type 2 diabetes mellitus with hyperglycemia: Secondary | ICD-10-CM | POA: Diagnosis present

## 2014-08-07 DIAGNOSIS — E669 Obesity, unspecified: Secondary | ICD-10-CM | POA: Diagnosis present

## 2014-08-07 DIAGNOSIS — F17210 Nicotine dependence, cigarettes, uncomplicated: Secondary | ICD-10-CM | POA: Diagnosis present

## 2014-08-07 DIAGNOSIS — N179 Acute kidney failure, unspecified: Secondary | ICD-10-CM | POA: Diagnosis present

## 2014-08-07 DIAGNOSIS — Z881 Allergy status to other antibiotic agents status: Secondary | ICD-10-CM

## 2014-08-07 DIAGNOSIS — Z7982 Long term (current) use of aspirin: Secondary | ICD-10-CM

## 2014-08-07 DIAGNOSIS — R197 Diarrhea, unspecified: Secondary | ICD-10-CM

## 2014-08-07 DIAGNOSIS — I2510 Atherosclerotic heart disease of native coronary artery without angina pectoris: Secondary | ICD-10-CM | POA: Diagnosis present

## 2014-08-07 DIAGNOSIS — E119 Type 2 diabetes mellitus without complications: Secondary | ICD-10-CM

## 2014-08-07 DIAGNOSIS — IMO0002 Reserved for concepts with insufficient information to code with codable children: Secondary | ICD-10-CM | POA: Diagnosis present

## 2014-08-07 DIAGNOSIS — R1032 Left lower quadrant pain: Secondary | ICD-10-CM | POA: Insufficient documentation

## 2014-08-07 DIAGNOSIS — E860 Dehydration: Secondary | ICD-10-CM | POA: Diagnosis present

## 2014-08-07 HISTORY — DX: Pneumonia, unspecified organism: J18.9

## 2014-08-07 HISTORY — DX: Depression, unspecified: F32.A

## 2014-08-07 HISTORY — DX: Major depressive disorder, single episode, unspecified: F32.9

## 2014-08-07 HISTORY — DX: Unspecified chronic bronchitis: J42

## 2014-08-07 LAB — COMPREHENSIVE METABOLIC PANEL
ALK PHOS: 84 U/L (ref 39–117)
ALT: 20 U/L (ref 0–35)
AST: 31 U/L (ref 0–37)
Albumin: 3 g/dL — ABNORMAL LOW (ref 3.5–5.2)
Anion gap: 12 (ref 5–15)
BILIRUBIN TOTAL: 0.6 mg/dL (ref 0.3–1.2)
BUN: 29 mg/dL — ABNORMAL HIGH (ref 6–23)
CO2: 20 mmol/L (ref 19–32)
Calcium: 8.3 mg/dL — ABNORMAL LOW (ref 8.4–10.5)
Chloride: 104 mmol/L (ref 96–112)
Creatinine, Ser: 1.63 mg/dL — ABNORMAL HIGH (ref 0.50–1.10)
GFR, EST AFRICAN AMERICAN: 39 mL/min — AB (ref >90)
GFR, EST NON AFRICAN AMERICAN: 34 mL/min — AB (ref >90)
Glucose, Bld: 257 mg/dL — ABNORMAL HIGH (ref 70–99)
Potassium: 4 mmol/L (ref 3.5–5.1)
SODIUM: 136 mmol/L (ref 135–145)
Total Protein: 7.1 g/dL (ref 6.0–8.3)

## 2014-08-07 LAB — CBC WITH DIFFERENTIAL/PLATELET
Basophils Absolute: 0 10*3/uL (ref 0.0–0.1)
Basophils Relative: 0 % (ref 0–1)
EOS ABS: 0 10*3/uL (ref 0.0–0.7)
Eosinophils Relative: 0 % (ref 0–5)
HEMATOCRIT: 30.5 % — AB (ref 36.0–46.0)
HEMOGLOBIN: 10 g/dL — AB (ref 12.0–15.0)
LYMPHS PCT: 15 % (ref 12–46)
Lymphs Abs: 1.2 10*3/uL (ref 0.7–4.0)
MCH: 25.8 pg — ABNORMAL LOW (ref 26.0–34.0)
MCHC: 32.8 g/dL (ref 30.0–36.0)
MCV: 78.8 fL (ref 78.0–100.0)
Monocytes Absolute: 0.5 10*3/uL (ref 0.1–1.0)
Monocytes Relative: 6 % (ref 3–12)
NEUTROS ABS: 6.6 10*3/uL (ref 1.7–7.7)
NEUTROS PCT: 79 % — AB (ref 43–77)
Platelets: 275 10*3/uL (ref 150–400)
RBC: 3.87 MIL/uL (ref 3.87–5.11)
RDW: 15.2 % (ref 11.5–15.5)
WBC: 8.3 10*3/uL (ref 4.0–10.5)

## 2014-08-07 LAB — URINALYSIS, ROUTINE W REFLEX MICROSCOPIC
GLUCOSE, UA: NEGATIVE mg/dL
Ketones, ur: NEGATIVE mg/dL
Leukocytes, UA: NEGATIVE
Nitrite: NEGATIVE
PH: 5 (ref 5.0–8.0)
Protein, ur: 100 mg/dL — AB
SPECIFIC GRAVITY, URINE: 1.016 (ref 1.005–1.030)
UROBILINOGEN UA: 0.2 mg/dL (ref 0.0–1.0)

## 2014-08-07 LAB — GLUCOSE, CAPILLARY: GLUCOSE-CAPILLARY: 166 mg/dL — AB (ref 70–99)

## 2014-08-07 LAB — EXPECTORATED SPUTUM ASSESSMENT W REFEX TO RESP CULTURE

## 2014-08-07 LAB — URINE MICROSCOPIC-ADD ON

## 2014-08-07 LAB — EXPECTORATED SPUTUM ASSESSMENT W GRAM STAIN, RFLX TO RESP C

## 2014-08-07 LAB — CG4 I-STAT (LACTIC ACID): Lactic Acid, Venous: 1.23 mmol/L (ref 0.5–2.0)

## 2014-08-07 LAB — LIPASE, BLOOD: Lipase: 36 U/L (ref 11–59)

## 2014-08-07 MED ORDER — ALBUTEROL SULFATE (2.5 MG/3ML) 0.083% IN NEBU
2.5000 mg | INHALATION_SOLUTION | Freq: Once | RESPIRATORY_TRACT | Status: AC
  Administered 2014-08-07: 2.5 mg via RESPIRATORY_TRACT
  Filled 2014-08-07: qty 3

## 2014-08-07 MED ORDER — CEFTRIAXONE SODIUM IN DEXTROSE 20 MG/ML IV SOLN
1.0000 g | INTRAVENOUS | Status: DC
  Administered 2014-08-08 – 2014-08-09 (×2): 1 g via INTRAVENOUS
  Filled 2014-08-07 (×3): qty 50

## 2014-08-07 MED ORDER — PANTOPRAZOLE SODIUM 40 MG PO TBEC
40.0000 mg | DELAYED_RELEASE_TABLET | Freq: Every day | ORAL | Status: DC
  Administered 2014-08-07 – 2014-08-10 (×4): 40 mg via ORAL
  Filled 2014-08-07 (×4): qty 1

## 2014-08-07 MED ORDER — METOPROLOL TARTRATE 50 MG PO TABS
50.0000 mg | ORAL_TABLET | Freq: Two times a day (BID) | ORAL | Status: DC
  Administered 2014-08-08 – 2014-08-10 (×5): 50 mg via ORAL
  Filled 2014-08-07 (×6): qty 1

## 2014-08-07 MED ORDER — AZITHROMYCIN 500 MG IV SOLR
500.0000 mg | INTRAVENOUS | Status: DC
  Administered 2014-08-08 – 2014-08-09 (×2): 500 mg via INTRAVENOUS
  Filled 2014-08-07 (×4): qty 500

## 2014-08-07 MED ORDER — AZITHROMYCIN 500 MG IV SOLR
500.0000 mg | Freq: Once | INTRAVENOUS | Status: AC
  Administered 2014-08-07: 500 mg via INTRAVENOUS
  Filled 2014-08-07: qty 500

## 2014-08-07 MED ORDER — ALBUTEROL SULFATE (2.5 MG/3ML) 0.083% IN NEBU: 2.5000 mg | INHALATION_SOLUTION | RESPIRATORY_TRACT | Status: DC | PRN

## 2014-08-07 MED ORDER — ONDANSETRON 4 MG PO TBDP
4.0000 mg | ORAL_TABLET | Freq: Once | ORAL | Status: AC
  Administered 2014-08-07: 4 mg via ORAL
  Filled 2014-08-07: qty 1

## 2014-08-07 MED ORDER — SODIUM CHLORIDE 0.9 % IV SOLN
INTRAVENOUS | Status: DC
  Administered 2014-08-07 – 2014-08-09 (×3): via INTRAVENOUS

## 2014-08-07 MED ORDER — INSULIN ASPART 100 UNIT/ML ~~LOC~~ SOLN
0.0000 [IU] | Freq: Three times a day (TID) | SUBCUTANEOUS | Status: DC
  Administered 2014-08-08: 2 [IU] via SUBCUTANEOUS
  Administered 2014-08-08: 3 [IU] via SUBCUTANEOUS
  Administered 2014-08-09: 2 [IU] via SUBCUTANEOUS
  Administered 2014-08-09: 3 [IU] via SUBCUTANEOUS
  Administered 2014-08-10: 2 [IU] via SUBCUTANEOUS

## 2014-08-07 MED ORDER — ASPIRIN 81 MG PO CHEW
81.0000 mg | CHEWABLE_TABLET | Freq: Every day | ORAL | Status: DC
Start: 2014-08-07 — End: 2014-08-10
  Administered 2014-08-07 – 2014-08-10 (×4): 81 mg via ORAL
  Filled 2014-08-07 (×4): qty 1

## 2014-08-07 MED ORDER — ENOXAPARIN SODIUM 40 MG/0.4ML ~~LOC~~ SOLN
40.0000 mg | SUBCUTANEOUS | Status: DC
  Administered 2014-08-07 – 2014-08-08 (×2): 40 mg via SUBCUTANEOUS
  Filled 2014-08-07 (×3): qty 0.4

## 2014-08-07 MED ORDER — SODIUM CHLORIDE 0.9 % IV BOLUS (SEPSIS)
1000.0000 mL | Freq: Once | INTRAVENOUS | Status: AC
  Administered 2014-08-07: 1000 mL via INTRAVENOUS

## 2014-08-07 MED ORDER — ENSURE ENLIVE PO LIQD
237.0000 mL | Freq: Two times a day (BID) | ORAL | Status: DC
  Administered 2014-08-08 – 2014-08-10 (×5): 237 mL via ORAL

## 2014-08-07 MED ORDER — DEXTROSE 5 % IV SOLN
1.0000 g | Freq: Once | INTRAVENOUS | Status: AC
  Administered 2014-08-07: 1 g via INTRAVENOUS
  Filled 2014-08-07: qty 10

## 2014-08-07 MED ORDER — IPRATROPIUM BROMIDE 0.02 % IN SOLN
0.5000 mg | Freq: Once | RESPIRATORY_TRACT | Status: AC
  Administered 2014-08-07: 0.5 mg via RESPIRATORY_TRACT
  Filled 2014-08-07: qty 2.5

## 2014-08-07 NOTE — ED Notes (Signed)
Pt presents with 4 day h/o shortness of breath, productive cough with green phlegm, nasal congestion.  Pt reports 2 day h/o diarrhea, nausea and R sided abdominal pain.

## 2014-08-07 NOTE — Progress Notes (Signed)
Pt received to 5w, rm 12, a/o x4, lungs sounds bilat lobes w/rhonchi, exp wheezing, prod cough, moderate amt white color. PIV intact to RAC, IVF NS infusing w/o difficulty, site benign. Plan of care initiated, pt oriented to rm, unit, call light, and meds, expressed understanding.

## 2014-08-07 NOTE — ED Notes (Signed)
Attempted report to 5W. 

## 2014-08-07 NOTE — ED Provider Notes (Signed)
CSN: [id number]     Arrival date & time 08/07/14  1350 History   First MD Initiated Contact with Patient 08/07/14 1711     Chief Complaint  Patient presents with  . Cough   (Consider location/radiation/quality/duration/timing/severity/associated sxs/prior Treatment) Patient is a 59 y.o. female presenting with cough. The history is provided by the patient. No language interpreter was used.  Cough Cough characteristics:  Non-productive Severity:  Moderate Onset quality:  Gradual Duration:  3 days Timing:  Intermittent Progression:  Worsening Chronicity:  New Context: upper respiratory infection   Relieved by:  Nothing Worsened by:  Nothing tried Ineffective treatments:  None tried Associated symptoms: chills, headaches, myalgias, shortness of breath and wheezing   Associated symptoms: no chest pain, no diaphoresis and no fever     Past Medical History  Diagnosis Date  . CAD in native artery 02/2004    Mid LAD lesoin just after Large D1 --> CABG x 1 LIMA-LAD  . 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 competitive flow  . Hypertension   . High cholesterol   . Type II diabetes mellitus   . Anemia   . History of blood transfusion 2005    "related to OHS"   . Allergy    Past Surgical History  Procedure Laterality Date  . Cesarean section  1977; 1989  . Tubal ligation  1989  . Coronary artery bypass graft  02/2004    "CABG X1" (03/22/2013); LIMA-LAD  . Cardiac catheterization  2010    Previous LAD 95% lesion - now ~30-40%; patent LIMA with competitive flow  . Cholecystectomy  ~ 2012   Family History  Problem Relation Age of Onset  . Heart disease Mother   . Diabetes Mother   . Cancer Mother     cervical  . Diabetes Father   . Lupus Sister   . Heart failure Sister   . Hepatitis C Sister   . Heart failure Brother   . Diabetes Brother   . Hypertension Daughter    History  Substance Use Topics  . Smoking status:  Current Every Day Smoker -- 1.00 packs/day for 37 years    Types: Cigarettes  . Smokeless tobacco: Never Used     Comment: admits to smoking .5 ppd  . Alcohol Use: No   OB History    No data available     Review of Systems  Constitutional: Positive for chills and fatigue. Negative for fever and diaphoresis.  Respiratory: Positive for cough, shortness of breath and wheezing. Negative for chest tightness.   Cardiovascular: Negative for chest pain and palpitations.  Gastrointestinal: Positive for nausea, vomiting and diarrhea. Negative for abdominal pain and blood in stool.  Genitourinary: Negative for dysuria.  Musculoskeletal: Positive for myalgias.  Neurological: Positive for headaches. Negative for weakness and light-headedness.  Psychiatric/Behavioral: Negative for confusion.  All other systems reviewed and are negative.     Allergies  Ciprofloxacin  Home Medications   Prior to Admission medications   Medication Sig Start Date End Date Taking? Authorizing Provider  aspirin 81 MG tablet Take 1 tablet (81 mg total) by mouth daily. 02/14/13  Yes Ripudeep Krystal Eaton, MD  atorvastatin (LIPITOR) 10 MG tablet Take 1 tablet (10 mg total) by mouth daily. 07/08/14  Yes Tresa Garter, MD  buPROPion (WELLBUTRIN) 100 MG tablet Take 1 tablet (100 mg total) by mouth 2 (two) times daily. 07/08/14  Yes Tresa Garter, MD  gabapentin (NEURONTIN) 100 MG capsule Take 1 capsule (100 mg total) by mouth daily. 07/08/14  Yes Tresa Garter, MD  hydrochlorothiazide (HYDRODIURIL) 12.5 MG tablet Take 1 tablet (12.5 mg total) by mouth daily. 07/08/14  Yes Tresa Garter, MD  Insulin Glargine (TOUJEO SOLOSTAR) 300 UNIT/ML SOPN Inject 16 Units into the skin daily with breakfast. 07/08/14  Yes Tresa Garter, MD  lisinopril (PRINIVIL,ZESTRIL) 10 MG tablet Take 1 tablet (10 mg total) by mouth daily. 07/08/14  Yes Tresa Garter, MD  metFORMIN (GLUCOPHAGE XR) 500 MG 24 hr tablet Take 2  tablets (1,000 mg total) by mouth 2 (two) times daily with a meal. 07/08/14  Yes Olugbemiga E Doreene Burke, MD  metoprolol (LOPRESSOR) 50 MG tablet Take 1 tablet (50 mg total) by mouth 2 (two) times daily. 07/08/14  Yes Tresa Garter, MD  pantoprazole (PROTONIX) 40 MG tablet Take 1 tablet (40 mg total) by mouth daily. 07/08/14  Yes Tresa Garter, MD  senna-docusate (SENOKOT-S) 8.6-50 MG per tablet Take 1 tablet by mouth at bedtime as needed for mild constipation. 03/23/13  Yes Ripudeep Krystal Eaton, MD  traMADol (ULTRAM) 50 MG tablet Take 1 tablet (50 mg total) by mouth every 6 (six) hours as needed. 03/23/13  Yes Ripudeep Krystal Eaton, MD  amoxicillin-clavulanate (AUGMENTIN) 875-125 MG per tablet Take 1 tablet by mouth 2 (two) times daily. 07/08/14   Tresa Garter, MD  naproxen (NAPROSYN) 500 MG tablet Take 1 tablet (500 mg total) by mouth 2 (two) times daily with a meal. Patient not taking: Reported on 07/08/2014 03/19/13   Linton Flemings, MD  nitroGLYCERIN (NITROSTAT) 0.4 MG SL tablet Place 1 tablet (0.4 mg total) under the tongue every 5 (five) minutes as needed for chest pain. Patient not taking: Reported on 07/08/2014 03/23/13   Ripudeep K Rai, MD   BP 123/55 mmHg  Pulse 89  Temp(Src) 99 F (37.2 C) (Oral)  Resp 18  Ht 5\' 5"  (1.651 m)  Wt 230 lb (104.327 kg)  BMI 38.27 kg/m2  SpO2 93% Physical Exam  Constitutional: Vital signs are normal. She appears well-developed and well-nourished. She does not appear ill. No distress.  HENT:  Head: Normocephalic and atraumatic.  Nose: Nose normal.  Mouth/Throat: Oropharynx is clear and moist. No oropharyngeal exudate.  Eyes: EOM are normal. Pupils are equal, round, and reactive to light.  Neck: Normal range of motion. Neck supple.  Cardiovascular: Normal rate, regular rhythm, normal heart sounds and intact distal pulses.   No murmur heard. Pulmonary/Chest: Effort normal. No respiratory distress. She has wheezes. She exhibits no tenderness.  Normal work of  breathing, speaking in full sentences.   End expiratory wheezing throughout Wet sounding cough  Abdominal: Soft. There is no tenderness. There is no rebound and no guarding.  Musculoskeletal: Normal range of motion. She exhibits no tenderness.  Lymphadenopathy:    She has no cervical adenopathy.  Neurological: She is alert. No cranial nerve deficit. Coordination normal.  Skin: Skin is warm and dry. She is not diaphoretic.  Psychiatric: She has a normal mood and affect. Her behavior is normal. Judgment and thought content normal.  Nursing note and vitals reviewed.   ED Course  Procedures (including critical care time) Labs Review Labs Reviewed  CBC WITH DIFFERENTIAL/PLATELET - Abnormal; Notable for the following:    Hemoglobin 10.0 (*)    HCT 30.5 (*)    MCH 25.8 (*)    Neutrophils Relative % 79 (*)    All other components  within normal limits  COMPREHENSIVE METABOLIC PANEL - Abnormal; Notable for the following:    Glucose, Bld 257 (*)    BUN 29 (*)    Creatinine, Ser 1.63 (*)    Calcium 8.3 (*)    Albumin 3.0 (*)    GFR calc non Af Amer 34 (*)    GFR calc Af Amer 39 (*)    All other components within normal limits  CULTURE, BLOOD (ROUTINE X 2)  CULTURE, BLOOD (ROUTINE X 2)  URINE CULTURE  LIPASE, BLOOD  URINALYSIS, ROUTINE W REFLEX MICROSCOPIC    Imaging Review Dg Chest 2 View  08/07/2014   CLINICAL DATA:  58 year old female with shortness of breath productive cough and congestion for 4 days. Current history of smoker. Initial encounter.  EXAM: CHEST  2 VIEW  COMPARISON:  03/22/2013 and earlier.  FINDINGS: Stable cardiac size and mediastinal contours. Stable sequelae of median sternotomy. No pleural effusion, pneumothorax, pulmonary edema or consolidation. Mildly increased streaky/interstitial opacity at the left lung base. This is only evident on the frontal view. No acute osseous abnormality identified. Stable cholecystectomy clips. Visualized tracheal air column is within  normal limits.  IMPRESSION: Mildly increased streaky opacity at the left lung base compatible with bronchopneumonia in this setting. No pleural effusion.  Post treatment radiographs recommended to document resolution.   Electronically Signed   By: Genevie Ann M.D.   On: 08/07/2014 14:57    EKG Interpretation None      MDM   Final diagnoses:  AKI (acute kidney injury)  CAP (community acquired pneumonia)  Nausea vomiting and diarrhea   Pt is a 59 yo F with hx of CAD, HTN, DM, and HLD who presents with 3 days of cough, congestion, SOB, nausea, vomiting, diarrhea, and myalgias.   Denies fevers at home but has had chills. Has had up to 3 x loose stools daily but denies passing frank watery diarrhea or significant abd pain.  Denies known sick contacts.  Did not get a flu shot this year.   Nontoxic appearing, speaking in full sentences, no dyspnea on room air.  Wheezing diffusely on exam, wet sounding cough.     Given albuterol and atrovent nebulizer treatments.  Xray and labs ordered.   Found to have a left basilar opacity on cxr.   Will treat for CAP.   Blood cultures obtained and she was covered with rocephin and azithromycin.    Also found to have an AKI.  Baseline Cr 0.6 but today is 1.6.  Given NS bolus.   Called hospitatlist for admission.  They requested that we add on a rapid flu and lactate.  These labs were sent.  Admission request placed.  Pt is floor appropriate.   Patient was seen with ED Attending, Dr. Gwynneth Aliment, MD    Tori Milks, MD 08/08/14 5916  Serita Grit, MD 08/08/14 [phone]

## 2014-08-07 NOTE — H&P (Addendum)
PCP:   Angelica Chessman, MD   Chief Complaint:  Sob, cough, fever  HPI: 59 yo female h/o dm, htn, cad comes in with 3 days of cough, malaise, congestion, fever and wheezing.  No h/o copd.  Has not been eating or drinking very well.  Several of her grandchildren have been dx with the flu over the last couple of days.  No recent abx.  Pt has pna on cxr.  oxgyen sats are normal along with normal vs but she has mild AKI for which she was referred for admission.  Denies cp, or abd pain.  No dysuria.  Feeling some better with treatment she has received in the ED.  Review of Systems:  Positive and negative as per HPI otherwise all other systems are negative  Past Medical History: Past Medical History  Diagnosis Date  . CAD in native artery 02/2004    Mid LAD lesoin just after Large D1 --> CABG x 1 LIMA-LAD  . 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 competitive flow  . Hypertension   . High cholesterol   . Type II diabetes mellitus   . Anemia   . History of blood transfusion 2005    "related to OHS"   . Allergy    Past Surgical History  Procedure Laterality Date  . Cesarean section  1977; 1989  . Tubal ligation  1989  . Coronary artery bypass graft  02/2004    "CABG X1" (03/22/2013); LIMA-LAD  . Cardiac catheterization  2010    Previous LAD 95% lesion - now ~30-40%; patent LIMA with competitive flow  . Cholecystectomy  ~ 2012    Medications: Prior to Admission medications   Medication Sig Start Date End Date Taking? Authorizing Provider  aspirin 81 MG tablet Take 1 tablet (81 mg total) by mouth daily. 02/14/13  Yes Ripudeep Krystal Eaton, MD  atorvastatin (LIPITOR) 10 MG tablet Take 1 tablet (10 mg total) by mouth daily. 07/08/14  Yes Tresa Garter, MD  buPROPion (WELLBUTRIN) 100 MG tablet Take 1 tablet (100 mg total) by mouth 2 (two) times daily. 07/08/14  Yes Tresa Garter, MD  gabapentin (NEURONTIN) 100 MG capsule Take 1  capsule (100 mg total) by mouth daily. 07/08/14  Yes Tresa Garter, MD  hydrochlorothiazide (HYDRODIURIL) 12.5 MG tablet Take 1 tablet (12.5 mg total) by mouth daily. 07/08/14  Yes Tresa Garter, MD  Insulin Glargine (TOUJEO SOLOSTAR) 300 UNIT/ML SOPN Inject 16 Units into the skin daily with breakfast. 07/08/14  Yes Tresa Garter, MD  lisinopril (PRINIVIL,ZESTRIL) 10 MG tablet Take 1 tablet (10 mg total) by mouth daily. 07/08/14  Yes Tresa Garter, MD  metFORMIN (GLUCOPHAGE XR) 500 MG 24 hr tablet Take 2 tablets (1,000 mg total) by mouth 2 (two) times daily with a meal. 07/08/14  Yes Olugbemiga E Doreene Burke, MD  metoprolol (LOPRESSOR) 50 MG tablet Take 1 tablet (50 mg total) by mouth 2 (two) times daily. 07/08/14  Yes Tresa Garter, MD  pantoprazole (PROTONIX) 40 MG tablet Take 1 tablet (40 mg total) by mouth daily. 07/08/14  Yes Tresa Garter, MD  senna-docusate (SENOKOT-S) 8.6-50 MG per tablet Take 1 tablet by mouth at bedtime as needed for mild constipation. 03/23/13  Yes Ripudeep Krystal Eaton, MD  traMADol (ULTRAM) 50 MG tablet Take 1 tablet (50 mg total) by mouth every 6 (six) hours as needed. 03/23/13  Yes Ripudeep Krystal Eaton, MD  amoxicillin-clavulanate (AUGMENTIN) 875-125 MG per tablet Take 1 tablet by mouth 2 (two) times daily. 07/08/14   Tresa Garter, MD  naproxen (NAPROSYN) 500 MG tablet Take 1 tablet (500 mg total) by mouth 2 (two) times daily with a meal. Patient not taking: Reported on 07/08/2014 03/19/13   Linton Flemings, MD  nitroGLYCERIN (NITROSTAT) 0.4 MG SL tablet Place 1 tablet (0.4 mg total) under the tongue every 5 (five) minutes as needed for chest pain. Patient not taking: Reported on 07/08/2014 03/23/13   Ripudeep Krystal Eaton, MD    Allergies:   Allergies  Allergen Reactions  . Ciprofloxacin Rash    Social History:  reports that she has been smoking Cigarettes.  She has a 37 pack-year smoking history. She has never used smokeless tobacco. She reports that she uses  illicit drugs. She reports that she does not drink alcohol.  Family History: Family History  Problem Relation Age of Onset  . Heart disease Mother   . Diabetes Mother   . Cancer Mother     cervical  . Diabetes Father   . Lupus Sister   . Heart failure Sister   . Hepatitis C Sister   . Heart failure Brother   . Diabetes Brother   . Hypertension Daughter     Physical Exam: Filed Vitals:   08/07/14 1403 08/07/14 1715 08/07/14 1730 08/07/14 1745  BP: 117/56 123/55 100/62 118/61  Pulse: 105 89 94 89  Temp: 99 F (37.2 C)     TempSrc: Oral     Resp: 18 18 16 19   Height: 5\' 5"  (1.651 m)     Weight: 104.327 kg (230 lb)     SpO2: 95% 93% 96% 97%   General appearance: alert, cooperative and no distress Head: Normocephalic, without obvious abnormality, atraumatic Eyes: negative Nose: Nares normal. Septum midline. Mucosa normal. No drainage or sinus tenderness. Neck: no JVD and supple, symmetrical, trachea midline Lungs: clear to auscultation bilaterally Heart: regular rate and rhythm, S1, S2 normal, no murmur, click, rub or gallop Abdomen: soft, non-tender; bowel sounds normal; no masses,  no organomegaly Extremities: extremities normal, atraumatic, no cyanosis or edema Pulses: 2+ and symmetric Skin: Skin color, texture, turgor normal. No rashes or lesions Neurologic: Grossly normal    Labs on Admission:   Recent Labs  08/07/14 1420  NA 136  K 4.0  CL 104  CO2 20  GLUCOSE 257*  BUN 29*  CREATININE 1.63*  CALCIUM 8.3*    Recent Labs  08/07/14 1420  AST 31  ALT 20  ALKPHOS 84  BILITOT 0.6  PROT 7.1  ALBUMIN 3.0*    Recent Labs  08/07/14 1420  LIPASE 36    Recent Labs  08/07/14 1420  WBC 8.3  NEUTROABS 6.6  HGB 10.0*  HCT 30.5*  MCV 78.8  PLT 275    Radiological Exams on Admission: Dg Chest 2 View  08/07/2014   CLINICAL DATA:  59 year old female with shortness of breath productive cough and congestion for 4 days. Current history of smoker.  Initial encounter.  EXAM: CHEST  2 VIEW  COMPARISON:  03/22/2013 and earlier.  FINDINGS: Stable cardiac size and mediastinal contours. Stable sequelae of median sternotomy. No pleural effusion, pneumothorax, pulmonary edema or consolidation. Mildly increased streaky/interstitial opacity at the left lung base. This is only evident on the frontal view. No acute osseous abnormality identified. Stable cholecystectomy clips. Visualized tracheal air column is within normal limits.  IMPRESSION: Mildly increased streaky opacity at the left lung base  compatible with bronchopneumonia in this setting. No pleural effusion.  Post treatment radiographs recommended to document resolution.   Electronically Signed   By: Genevie Ann M.D.   On: 08/07/2014 14:57    Assessment/Plan  59 yo female with CAP and mild AKI from dehydration  Principal Problem:   CAP (community acquired pneumonia)-  pna pathway,  Iv rocephin and azithromycin.  May be influenza related, ck quick flu swab in order to place on isolation, beyond window for tamiflu.    Active Problems:  Stable unless o/w noted   Uncontrolled diabetes mellitus-  ssi   Essential hypertension, benign   CAD (coronary artery disease) - CABG x 1 (LIMA-LAD)   AKI (acute kidney injury)-  Volume depletion.  ivf overnight.  Admit to medical.  Full code.  Expected LOS 2-3 midnights depending on her renal response to ivf.  Ramiz Turpin A 08/07/2014, 6:44 PM  Quick flu still pending.  Initial order was cancelled by ED physician b/c symptoms were beyond 3 days despite my request for testing.  Dr Grandville Silos put in order again for quick flu per my request, and still pending.  Unclear as to why this has not been done yet.

## 2014-08-08 LAB — CBC WITH DIFFERENTIAL/PLATELET
BASOS ABS: 0 10*3/uL (ref 0.0–0.1)
Basophils Relative: 0 % (ref 0–1)
Eosinophils Absolute: 0 10*3/uL (ref 0.0–0.7)
Eosinophils Relative: 0 % (ref 0–5)
HEMATOCRIT: 25.8 % — AB (ref 36.0–46.0)
Hemoglobin: 8.4 g/dL — ABNORMAL LOW (ref 12.0–15.0)
LYMPHS ABS: 2 10*3/uL (ref 0.7–4.0)
LYMPHS PCT: 29 % (ref 12–46)
MCH: 25.9 pg — ABNORMAL LOW (ref 26.0–34.0)
MCHC: 32.6 g/dL (ref 30.0–36.0)
MCV: 79.6 fL (ref 78.0–100.0)
MONOS PCT: 7 % (ref 3–12)
Monocytes Absolute: 0.5 10*3/uL (ref 0.1–1.0)
NEUTROS ABS: 4.3 10*3/uL (ref 1.7–7.7)
Neutrophils Relative %: 64 % (ref 43–77)
Platelets: 244 10*3/uL (ref 150–400)
RBC: 3.24 MIL/uL — ABNORMAL LOW (ref 3.87–5.11)
RDW: 15.4 % (ref 11.5–15.5)
WBC: 6.8 10*3/uL (ref 4.0–10.5)

## 2014-08-08 LAB — INFLUENZA PANEL BY PCR (TYPE A & B)
H1N1 flu by pcr: NOT DETECTED
INFLAPCR: NEGATIVE
Influenza B By PCR: NEGATIVE

## 2014-08-08 LAB — GLUCOSE, CAPILLARY
GLUCOSE-CAPILLARY: 211 mg/dL — AB (ref 70–99)
Glucose-Capillary: 170 mg/dL — ABNORMAL HIGH (ref 70–99)
Glucose-Capillary: 135 mg/dL — ABNORMAL HIGH (ref 70–99)
Glucose-Capillary: 251 mg/dL — ABNORMAL HIGH (ref 70–99)

## 2014-08-08 LAB — BASIC METABOLIC PANEL
ANION GAP: 9 (ref 5–15)
BUN: 22 mg/dL (ref 6–23)
CALCIUM: 8.2 mg/dL — AB (ref 8.4–10.5)
CHLORIDE: 108 mmol/L (ref 96–112)
CO2: 21 mmol/L (ref 19–32)
Creatinine, Ser: 0.97 mg/dL (ref 0.50–1.10)
GFR, EST AFRICAN AMERICAN: 72 mL/min — AB (ref >90)
GFR, EST NON AFRICAN AMERICAN: 62 mL/min — AB (ref >90)
Glucose, Bld: 180 mg/dL — ABNORMAL HIGH (ref 70–99)
Potassium: 3.7 mmol/L (ref 3.5–5.1)
Sodium: 138 mmol/L (ref 135–145)

## 2014-08-08 LAB — STREP PNEUMONIAE URINARY ANTIGEN: Strep Pneumo Urinary Antigen: NEGATIVE

## 2014-08-08 MED ORDER — BUPROPION HCL 100 MG PO TABS
100.0000 mg | ORAL_TABLET | Freq: Two times a day (BID) | ORAL | Status: DC
  Administered 2014-08-08 – 2014-08-10 (×5): 100 mg via ORAL
  Filled 2014-08-08 (×6): qty 1

## 2014-08-08 MED ORDER — ACETAMINOPHEN 325 MG PO TABS
650.0000 mg | ORAL_TABLET | Freq: Four times a day (QID) | ORAL | Status: DC | PRN
  Administered 2014-08-08 – 2014-08-09 (×3): 650 mg via ORAL
  Filled 2014-08-08 (×3): qty 2

## 2014-08-08 MED ORDER — GABAPENTIN 100 MG PO CAPS
100.0000 mg | ORAL_CAPSULE | Freq: Every day | ORAL | Status: DC
  Administered 2014-08-08 – 2014-08-10 (×3): 100 mg via ORAL
  Filled 2014-08-08 (×3): qty 1

## 2014-08-08 MED ORDER — INSULIN GLARGINE 100 UNIT/ML ~~LOC~~ SOLN
10.0000 [IU] | Freq: Every day | SUBCUTANEOUS | Status: DC
  Administered 2014-08-08: 10 [IU] via SUBCUTANEOUS
  Filled 2014-08-08 (×2): qty 0.1

## 2014-08-08 NOTE — Progress Notes (Signed)
Inpatient Diabetes Program Recommendations  AACE/ADA: New Consensus Statement on Inpatient Glycemic Control (2013)  Target Ranges:  Prepandial:   less than 140 mg/dL      Peak postprandial:   less than 180 mg/dL (1-2 hours)      Critically ill patients:  140 - 180 mg/dL   Results for Deborah Coffey, Deborah Coffey (MRN [id number]) as of 08/08/2014 12:40  Ref. Range 08/07/2014 21:06 08/08/2014 08:01 08/08/2014 12:18  Glucose-Capillary Latest Ref Range: 70-99 mg/dL 166 (H) 170 (H) 211 (H)   Diabetes history: DM2 Outpatient Diabetes medications: Toujeo 16 units QAM, Metformin 1000 mg BID Current orders for Inpatient glycemic control: Lantus 10 units daily, Novolog 0-9 units TID with meals  Inpatient Diabetes Program Recommendations Insulin - Basal: Please consider increasing Lantus to 12 units daily. Correction (SSI): Please consider increasing Novolog correction to moderate scale and ordering bedtime correction scale.  Thanks, Barnie Alderman, RN, MSN, CCRN, CDE Diabetes Coordinator Inpatient Diabetes Program [phone] (Team Pager from [place] to [place]) [phone] ([place] office) [phone] [facility] office)

## 2014-08-08 NOTE — Progress Notes (Signed)
Utilization review completed.  

## 2014-08-08 NOTE — Care Management Note (Unsigned)
    Page 1 of 1   08/08/2014     11:49:22 AM CARE MANAGEMENT NOTE 08/08/2014  Patient:  Deborah Coffey,Deborah Coffey   Account Number:  [id number]  Date Initiated:  08/08/2014  Documentation initiated by:  Carles Collet  Subjective/Objective Assessment:   CAP, acute kidney injury- volume depletion from home with family     Action/Plan:   will follow for discharge needs   Anticipated DC Date:  08/10/2014   Anticipated DC Plan:  [place]  CM consult      Choice offered to / List presented to:             Status of service:  In process, will continue to follow Medicare Important Message given?   (If response is "NO", the following Medicare IM given date fields will be blank) Date Medicare IM given:   Medicare IM given by:   Date Additional Medicare IM given:   Additional Medicare IM given by:    Discharge Disposition:    Per UR Regulation:    If discussed at Long Length of Stay Meetings, dates discussed:    Comments:  08-08-14 [facility] pt has PCP, no CM needs at this time, will continue to follow. Carles Collet RN BSN CM

## 2014-08-08 NOTE — Progress Notes (Signed)
Responded to spiritual  care consult to pray with patient per patient request.  Patient was in good spiritual  And was thankful for prayer, Chaplain visit and support. Provided spiritual and emotional,listening and ministry of presence.   08/08/14 1100  Clinical Encounter Type  Visited With Patient;Health care provider  Visit Type Initial;Spiritual support  Referral From Other (Comment)  Consult/Referral To (spiritual consult)  Spiritual Encounters  Spiritual Needs Prayer;Emotional  Stress Factors  Patient Stress Factors None identified  Jaclynn Major, [place]

## 2014-08-08 NOTE — Progress Notes (Signed)
Pt refused SCD's, educated on rational r/t use of SCD. Pt indep w/ADL's, amb in hallways, gait steady.

## 2014-08-08 NOTE — Progress Notes (Addendum)
[facility] PROGRESS NOTE  Graylyn Bunney EUM:[id number] DOB: Apr 20, 1955 DOA: 08/07/2014 PCP: Angelica Chessman, MD  Assessment/Plan: 1. Community acquired pneumonia -improving -FU flu PCR, add tamiflu if positive -continue rocephin/zithromax  2. DM -add lantus, SSI  3. CAD s/p CABG -stable, continue ASA/metoprolol/statin  4. Tobacco abuse -counseled  DVT proph: lovenox  Code Status: Full Code Family Communication: none at bedside (indicate person spoken with, relationship, and if by phone, the number) Disposition Plan: home in 1-2days     HPI/Subjective: Feels  Much better, some cough   Objective: Filed Vitals:   08/08/14 0438  BP: 117/48  Pulse: 87  Temp: 99.4 F (37.4 C)  Resp: 18    Intake/Output Summary (Last 24 hours) at 08/08/14 0926 Last data filed at 08/07/14 2211  Gross per 24 hour  Intake    240 ml  Output      0 ml  Net    240 ml   Filed Weights   08/07/14 1403 08/07/14 2004  Weight: 104.327 kg (230 lb) 104.32 kg (229 lb 15.7 oz)    Exam:   General:  AAox3  Cardiovascular: S1S2/RRR  Respiratory: ronchi at R base  Abdomen: soft, Nt, BS present  Musculoskeletal: no edema c/c  Data Reviewed: Basic Metabolic Panel:  Recent Labs Lab 08/07/14 1420  NA 136  K 4.0  CL 104  CO2 20  GLUCOSE 257*  BUN 29*  CREATININE 1.63*  CALCIUM 8.3*   Liver Function Tests:  Recent Labs Lab 08/07/14 1420  AST 31  ALT 20  ALKPHOS 84  BILITOT 0.6  PROT 7.1  ALBUMIN 3.0*    Recent Labs Lab 08/07/14 1420  LIPASE 36   No results for input(s): AMMONIA in the last 168 hours. CBC:  Recent Labs Lab 08/07/14 1420  WBC 8.3  NEUTROABS 6.6  HGB 10.0*  HCT 30.5*  MCV 78.8  PLT 275   Cardiac Enzymes: No results for input(s): CKTOTAL, CKMB, CKMBINDEX, TROPONINI in the last 168 hours. BNP (last 3 results) No results for input(s): BNP in the last 8760 hours.  ProBNP (last 3 results) No results for input(s): PROBNP in  the last 8760 hours.  CBG:  Recent Labs Lab 08/07/14 2106 08/08/14 0801  GLUCAP 166* 170*    Recent Results (from the past 240 hour(s))  Culture, sputum-assessment     Status: None   Collection Time: 08/07/14  8:30 PM  Result Value Ref Range Status   Specimen Description SPUTUM  Final   Special Requests NONE  Final   Sputum evaluation   Final    THIS SPECIMEN IS ACCEPTABLE. RESPIRATORY CULTURE REPORT TO FOLLOW.   Report Status 08/07/2014 FINAL  Final  Culture, respiratory (NON-Expectorated)     Status: None (Preliminary result)   Collection Time: 08/07/14  8:30 PM  Result Value Ref Range Status   Specimen Description SPUTUM  Final   Special Requests NONE  Final   Gram Stain   Final    ABUNDANT WBC PRESENT,BOTH PMN AND MONONUCLEAR RARE SQUAMOUS EPITHELIAL CELLS PRESENT FEW GRAM POSITIVE COCCI IN PAIRS Performed at [facility]    Culture PENDING  Incomplete   Report Status PENDING  Incomplete     Studies: Dg Chest 2 View  08/07/2014   CLINICAL DATA:  59 year old female with shortness of breath productive cough and congestion for 4 days. Current history of smoker. Initial encounter.  EXAM: CHEST  2 VIEW  COMPARISON:  03/22/2013 and earlier.  FINDINGS: Stable cardiac size and mediastinal  contours. Stable sequelae of median sternotomy. No pleural effusion, pneumothorax, pulmonary edema or consolidation. Mildly increased streaky/interstitial opacity at the left lung base. This is only evident on the frontal view. No acute osseous abnormality identified. Stable cholecystectomy clips. Visualized tracheal air column is within normal limits.  IMPRESSION: Mildly increased streaky opacity at the left lung base compatible with bronchopneumonia in this setting. No pleural effusion.  Post treatment radiographs recommended to document resolution.   Electronically Signed   By: Genevie Ann M.D.   On: 08/07/2014 14:57    Scheduled Meds: . aspirin  81 mg Oral Daily  . azithromycin  500 mg  Intravenous Q24H  . buPROPion  100 mg Oral BID  . cefTRIAXone (ROCEPHIN)  IV  1 g Intravenous Q24H  . enoxaparin (LOVENOX) injection  40 mg Subcutaneous Q24H  . feeding supplement (ENSURE ENLIVE)  237 mL Oral BID BM  . gabapentin  100 mg Oral Daily  . insulin aspart  0-9 Units Subcutaneous TID WC  . insulin glargine  10 Units Subcutaneous Daily  . metoprolol  50 mg Oral BID  . pantoprazole  40 mg Oral Daily   Continuous Infusions: . sodium chloride 75 mL/hr at 08/07/14 2028   Antibiotics Given (last 72 hours)    None      Principal Problem:   CAP (community acquired pneumonia) Active Problems:   Uncontrolled diabetes mellitus   Essential hypertension, benign   CAD (coronary artery disease) - CABG x 1 (LIMA-LAD)   AKI (acute kidney injury)    Time spent: 55min    Maxamillian Tienda  [facility] Pager [phone]. If 7PM-7AM, please contact night-coverage at [email or web address], password [facility] 08/08/2014, 9:26 AM  LOS: 1 day

## 2014-08-08 NOTE — Progress Notes (Signed)
Nutrition Brief Note  Patient identified on the Malnutrition Screening Tool (MST) Report  Wt Readings from Last 15 Encounters:  08/07/14 229 lb 15.7 oz (104.32 kg)  07/08/14 236 lb 3.2 oz (107.14 kg)  02/22/14 233 lb 12.8 oz (106.051 kg)  01/10/14 230 lb (104.327 kg)  01/01/14 224 lb 9.6 oz (101.878 kg)  06/20/13 233 lb (105.688 kg)  03/28/13 229 lb (103.874 kg)  03/22/13 230 lb (104.327 kg)  03/20/13 235 lb 12.8 oz (106.958 kg)  02/14/13 237 lb (107.502 kg)  12/29/12 238 lb (107.956 kg)  12/04/12 236 lb 9.6 oz (107.321 kg)  10/27/12 234 lb (106.142 kg)  09/25/12 226 lb (102.513 kg)  09/19/12 230 lb (104.327 kg)    Body mass index is 38.27 kg/(m^2). Patient meets criteria for obesity, class II based on current BMI.   Current diet order is Heart Healthy Carb Modified, patient is consuming approximately 75-100% of meals at this time. Labs and medications reviewed.   No nutrition interventions warranted at this time. If nutrition issues arise, please consult RD.   Keaundre Thelin A. Jimmye Norman, RD, LDN, CDE Pager: [phone] After hours Pager: [phone]

## 2014-08-09 ENCOUNTER — Inpatient Hospital Stay (HOSPITAL_COMMUNITY): Payer: Medicaid Other

## 2014-08-09 ENCOUNTER — Encounter (HOSPITAL_COMMUNITY): Payer: Self-pay | Admitting: Radiology

## 2014-08-09 DIAGNOSIS — I2510 Atherosclerotic heart disease of native coronary artery without angina pectoris: Secondary | ICD-10-CM

## 2014-08-09 LAB — BASIC METABOLIC PANEL
Anion gap: 7 (ref 5–15)
BUN: 15 mg/dL (ref 6–23)
CO2: 22 mmol/L (ref 19–32)
Calcium: 8.3 mg/dL — ABNORMAL LOW (ref 8.4–10.5)
Chloride: 110 mmol/L (ref 96–112)
Creatinine, Ser: 0.79 mg/dL (ref 0.50–1.10)
GFR calc Af Amer: >90 mL/min (ref >90)
GFR calc non Af Amer: >90 mL/min (ref >90)
GLUCOSE: 186 mg/dL — AB (ref 70–99)
POTASSIUM: 4.5 mmol/L (ref 3.5–5.1)
Sodium: 139 mmol/L (ref 135–145)

## 2014-08-09 LAB — URINE CULTURE: Colony Count: 40000

## 2014-08-09 LAB — CBC
HCT: 25.4 % — ABNORMAL LOW (ref 36.0–46.0)
Hemoglobin: 8.1 g/dL — ABNORMAL LOW (ref 12.0–15.0)
MCH: 25.5 pg — AB (ref 26.0–34.0)
MCHC: 31.9 g/dL (ref 30.0–36.0)
MCV: 79.9 fL (ref 78.0–100.0)
PLATELETS: 269 10*3/uL (ref 150–400)
RBC: 3.18 MIL/uL — AB (ref 3.87–5.11)
RDW: 15.6 % — AB (ref 11.5–15.5)
WBC: 4.7 10*3/uL (ref 4.0–10.5)

## 2014-08-09 LAB — LEGIONELLA ANTIGEN, URINE

## 2014-08-09 LAB — GLUCOSE, CAPILLARY
GLUCOSE-CAPILLARY: 181 mg/dL — AB (ref 70–99)
Glucose-Capillary: 210 mg/dL — ABNORMAL HIGH (ref 70–99)
Glucose-Capillary: 148 mg/dL — ABNORMAL HIGH (ref 70–99)
Glucose-Capillary: 290 mg/dL — ABNORMAL HIGH (ref 70–99)

## 2014-08-09 MED ORDER — INSULIN GLARGINE 100 UNIT/ML ~~LOC~~ SOLN
12.0000 [IU] | Freq: Every day | SUBCUTANEOUS | Status: DC
  Administered 2014-08-09 – 2014-08-10 (×2): 12 [IU] via SUBCUTANEOUS
  Filled 2014-08-09 (×2): qty 0.12

## 2014-08-09 MED ORDER — IOHEXOL 300 MG/ML  SOLN
25.0000 mL | INTRAMUSCULAR | Status: AC
  Administered 2014-08-09 (×2): 25 mL via ORAL

## 2014-08-09 MED ORDER — IOHEXOL 300 MG/ML  SOLN
100.0000 mL | Freq: Once | INTRAMUSCULAR | Status: AC | PRN
  Administered 2014-08-09: 100 mL via INTRAVENOUS

## 2014-08-09 NOTE — Progress Notes (Signed)
[facility] PROGRESS NOTE  Tyshawna Alarid KKX:[id number] DOB: [birth year] DOA: 08/07/2014 PCP: Angelica Chessman, MD  Assessment/Plan: 1. Community acquired pneumonia -improving -flu PCR negative -continue rocephin/zithromax, change to PO Abx tomorrow  2. DM -continue lantus, SSI  3. CAD s/p CABG -stable, continue ASA/metoprolol/statin  4. Tobacco abuse -counseled  5. LLQ Abd pain and tenderness-started last pm -check CT Abd,   DVT proph: lovenox  Code Status: Full Code Family Communication: none at bedside (indicate person spoken with, relationship, and if by phone, the number) Disposition Plan: home later today or tomorrow     HPI/Subjective: LLQ pain since last pm  Objective: Filed Vitals:   08/09/14 0552  BP: 114/54  Pulse: 62  Temp: 98.8 F (37.1 C)  Resp: 18    Intake/Output Summary (Last 24 hours) at 08/09/14 0950 Last data filed at 08/08/14 1907  Gross per 24 hour  Intake    240 ml  Output    850 ml  Net   -610 ml   Filed Weights   08/07/14 1403 08/07/14 2004  Weight: 104.327 kg (230 lb) 104.32 kg (229 lb 15.7 oz)    Exam:   General:  AAox3  Cardiovascular: S1S2/RRR  Respiratory: ronchi at R base  Abdomen: soft, LLQ tender,  BS present  Musculoskeletal: no edema c/c  Data Reviewed: Basic Metabolic Panel:  Recent Labs Lab 08/07/14 1420 08/08/14 0859 08/09/14 0707  NA 136 138 139  K 4.0 3.7 4.5  CL 104 108 110  CO2 20 21 22   GLUCOSE 257* 180* 186*  BUN 29* 22 15  CREATININE 1.63* 0.97 0.79  CALCIUM 8.3* 8.2* 8.3*   Liver Function Tests:  Recent Labs Lab 08/07/14 1420  AST 31  ALT 20  ALKPHOS 84  BILITOT 0.6  PROT 7.1  ALBUMIN 3.0*    Recent Labs Lab 08/07/14 1420  LIPASE 36   No results for input(s): AMMONIA in the last 168 hours. CBC:  Recent Labs Lab 08/07/14 1420 08/08/14 0859 08/09/14 0707  WBC 8.3 6.8 4.7  NEUTROABS 6.6 4.3  --   HGB 10.0* 8.4* 8.1*  HCT 30.5* 25.8* 25.4*  MCV  78.8 79.6 79.9  PLT 275 244 269   Cardiac Enzymes: No results for input(s): CKTOTAL, CKMB, CKMBINDEX, TROPONINI in the last 168 hours. BNP (last 3 results) No results for input(s): BNP in the last 8760 hours.  ProBNP (last 3 results) No results for input(s): PROBNP in the last 8760 hours.  CBG:  Recent Labs Lab 08/08/14 0801 08/08/14 1218 08/08/14 1715 08/08/14 2223 08/09/14 0817  GLUCAP 170* 211* 135* 251* 181*    Recent Results (from the past 240 hour(s))  Blood culture (routine x 2)     Status: None (Preliminary result)   Collection Time: 08/07/14  6:09 PM  Result Value Ref Range Status   Specimen Description BLOOD RIGHT ARM  Final   Special Requests BOTTLES DRAWN AEROBIC AND ANAEROBIC 10CC  Final   Culture   Final           BLOOD CULTURE RECEIVED NO GROWTH TO DATE CULTURE WILL BE HELD FOR 5 DAYS BEFORE ISSUING A FINAL NEGATIVE REPORT Performed at [facility]    Report Status PENDING  Incomplete  Blood culture (routine x 2)     Status: None (Preliminary result)   Collection Time: 08/07/14  6:09 PM  Result Value Ref Range Status   Specimen Description BLOOD RIGHT HAND  Final   Special Requests BOTTLES DRAWN AEROBIC AND ANAEROBIC  5CC  Final   Culture   Final           BLOOD CULTURE RECEIVED NO GROWTH TO DATE CULTURE WILL BE HELD FOR 5 DAYS BEFORE ISSUING A FINAL NEGATIVE REPORT Performed at [facility]    Report Status PENDING  Incomplete  Urine culture     Status: None   Collection Time: 08/07/14  7:25 PM  Result Value Ref Range Status   Specimen Description URINE, CLEAN CATCH  Final   Special Requests NONE  Final   Colony Count   Final    40,000 COLONIES/ML Performed at [facility]    Culture   Final    Multiple bacterial morphotypes present, none predominant. Suggest appropriate recollection if clinically indicated. Performed at [facility]    Report Status 08/09/2014 FINAL  Final  Culture, sputum-assessment     Status:  None   Collection Time: 08/07/14  8:30 PM  Result Value Ref Range Status   Specimen Description SPUTUM  Final   Special Requests NONE  Final   Sputum evaluation   Final    THIS SPECIMEN IS ACCEPTABLE. RESPIRATORY CULTURE REPORT TO FOLLOW.   Report Status 08/07/2014 FINAL  Final  Culture, respiratory (NON-Expectorated)     Status: None (Preliminary result)   Collection Time: 08/07/14  8:30 PM  Result Value Ref Range Status   Specimen Description SPUTUM  Final   Special Requests NONE  Final   Gram Stain   Final    ABUNDANT WBC PRESENT,BOTH PMN AND MONONUCLEAR RARE SQUAMOUS EPITHELIAL CELLS PRESENT FEW GRAM POSITIVE COCCI IN PAIRS Performed at [facility]    Culture   Final    Culture reincubated for better growth Performed at [facility]    Report Status PENDING  Incomplete     Studies: Dg Chest 2 View  08/07/2014   CLINICAL DATA:  59 year old female with shortness of breath productive cough and congestion for 4 days. Current history of smoker. Initial encounter.  EXAM: CHEST  2 VIEW  COMPARISON:  03/22/2013 and earlier.  FINDINGS: Stable cardiac size and mediastinal contours. Stable sequelae of median sternotomy. No pleural effusion, pneumothorax, pulmonary edema or consolidation. Mildly increased streaky/interstitial opacity at the left lung base. This is only evident on the frontal view. No acute osseous abnormality identified. Stable cholecystectomy clips. Visualized tracheal air column is within normal limits.  IMPRESSION: Mildly increased streaky opacity at the left lung base compatible with bronchopneumonia in this setting. No pleural effusion.  Post treatment radiographs recommended to document resolution.   Electronically Signed   By: Genevie Ann M.D.   On: 08/07/2014 14:57    Scheduled Meds: . aspirin  81 mg Oral Daily  . azithromycin  500 mg Intravenous Q24H  . buPROPion  100 mg Oral BID  . cefTRIAXone (ROCEPHIN)  IV  1 g Intravenous Q24H  . enoxaparin  (LOVENOX) injection  40 mg Subcutaneous Q24H  . feeding supplement (ENSURE ENLIVE)  237 mL Oral BID BM  . gabapentin  100 mg Oral Daily  . insulin aspart  0-9 Units Subcutaneous TID WC  . insulin glargine  10 Units Subcutaneous Daily  . metoprolol  50 mg Oral BID  . pantoprazole  40 mg Oral Daily   Continuous Infusions: . sodium chloride 75 mL/hr at 08/09/14 0600   Antibiotics Given (last 72 hours)    Date/Time Action Medication Dose Rate   08/08/14 1717 Given   cefTRIAXone (ROCEPHIN) 1 g in dextrose 5 %  50 mL IVPB - Premix 1 g 100 mL/hr   08/08/14 2045 Given   azithromycin (ZITHROMAX) 500 mg in dextrose 5 % 250 mL IVPB 500 mg 250 mL/hr      Principal Problem:   CAP (community acquired pneumonia) Active Problems:   Uncontrolled diabetes mellitus   Essential hypertension, benign   CAD (coronary artery disease) - CABG x 1 (LIMA-LAD)   AKI (acute kidney injury)    Time spent: 23min    Dedee Liss  [facility] Pager [phone]. If 7PM-7AM, please contact night-coverage at [email or web address], password [facility] 08/09/2014, 9:50 AM  LOS: 2 days

## 2014-08-10 DIAGNOSIS — R1032 Left lower quadrant pain: Secondary | ICD-10-CM

## 2014-08-10 LAB — CULTURE, RESPIRATORY W GRAM STAIN: Culture: NORMAL

## 2014-08-10 LAB — CBC
HEMATOCRIT: 26.5 % — AB (ref 36.0–46.0)
Hemoglobin: 8.4 g/dL — ABNORMAL LOW (ref 12.0–15.0)
MCH: 25.5 pg — ABNORMAL LOW (ref 26.0–34.0)
MCHC: 31.7 g/dL (ref 30.0–36.0)
MCV: 80.3 fL (ref 78.0–100.0)
PLATELETS: 335 10*3/uL (ref 150–400)
RBC: 3.3 MIL/uL — ABNORMAL LOW (ref 3.87–5.11)
RDW: 15.6 % — AB (ref 11.5–15.5)
WBC: 6.3 10*3/uL (ref 4.0–10.5)

## 2014-08-10 LAB — CULTURE, RESPIRATORY

## 2014-08-10 LAB — GLUCOSE, CAPILLARY: GLUCOSE-CAPILLARY: 166 mg/dL — AB (ref 70–99)

## 2014-08-10 MED ORDER — LEVOFLOXACIN 500 MG PO TABS: 500.0000 mg | ORAL_TABLET | Freq: Every day | ORAL | Status: DC

## 2014-08-10 NOTE — Progress Notes (Signed)
CARE MANAGEMENT NOTE 08/10/2014  Patient:  Deborah Coffey,Deborah Coffey   Account Number:  [id number]  Date Initiated:  08/08/2014  Documentation initiated by:  Carles Collet  Subjective/Objective Assessment:   CAP, acute kidney injury- volume depletion from home with family     Action/Plan:   will follow for discharge needs   Anticipated DC Date:  08/10/2014   Anticipated DC Plan:  [place]  CM consult      Choice offered to / List presented to:             Status of service:  Completed, signed off Medicare Important Message given?  NO (If response is "NO", the following Medicare IM given date fields will be blank) Date Medicare IM given:   Medicare IM given by:   Date Additional Medicare IM given:   Additional Medicare IM given by:    Discharge Disposition:  HOME/SELF CARE  Per UR Regulation:  Reviewed for med. necessity/level of care/duration of stay  If discussed at [place] of Stay Meetings, dates discussed:    Comments:  08-08-14 [facility] pt has PCP, no CM needs at this time, will continue to follow. Carles Collet RN BSN CM

## 2014-08-10 NOTE — Progress Notes (Signed)
08/10/14 Patient being discharged home today. IV site removed and discharge instructions reviewed with patient.

## 2014-08-10 NOTE — Discharge Summary (Signed)
Physician Discharge Summary  Deborah Coffey QVZ:[id number] DOB: 1956-02-24 DOA: 08/07/2014  PCP: Angelica Chessman, MD  Admit date: 08/07/2014 Discharge date: 08/10/2014  Time spent: 45 minutes  Recommendations for Outpatient Follow-up:  1. 1. PCP Dr.Jegede in 1 week, Needs workup for Anemia 2. FU CXR in 4-6weeks  Discharge Diagnoses:  Principal Problem:   CAP (community acquired pneumonia) Active Problems:   Uncontrolled diabetes mellitus   Essential hypertension, benign   CAD (coronary artery disease) - CABG x 1 (LIMA-LAD)   AKI (acute kidney injury)   LLQ abdominal pain   Anemia  Discharge Condition: stable  Diet recommendation: Diabetic  Filed Weights   08/07/14 1403 08/07/14 2004  Weight: 104.327 kg (230 lb) 104.32 kg (229 lb 15.7 oz)    History of present illness:  HPI: 59 yo female h/o dm, htn, cad comes in with 3 days of cough, malaise, congestion, fever and wheezing. No h/o copd.   Hospital Course:  1. Community acquired pneumonia -improved -flu PCR negative -treated with IV rocephin/zithromax initially and then changed to Oral antibiotics at discharge to complete course  2. DM -continue lantus, SSI  3. CAD s/p CABG -stable, continue ASA/metoprolol/statin  4. Tobacco abuse -counseled  5. LLQ Abd pain and tenderness noted 4/22 am, CT Abd revealed small rectus sheath hematoma, likely due to cough and precipitated by SQ heparin -Hb stable and pain improved next day   Discharge Exam: Filed Vitals:   08/10/14 0936  BP: 123/97  Pulse: 74  Temp:   Resp:     General: AAOx3 Cardiovascular: S!S2/RRR Respiratory: CTAB  Discharge Instructions   Discharge Instructions    Diet - low sodium heart healthy    Complete by:  As directed      Diet Carb Modified    Complete by:  As directed      Increase activity slowly    Complete by:  As directed           Discharge Medication List as of 08/10/2014 10:37 AM    START taking these medications    Details  levofloxacin (LEVAQUIN) 500 MG tablet Take 1 tablet (500 mg total) by mouth daily. For 4days, Starting 08/10/2014, Until Discontinued, Print      CONTINUE these medications which have NOT CHANGED   Details  aspirin 81 MG tablet Take 1 tablet (81 mg total) by mouth daily., Starting 02/14/2013, Until Discontinued, Print    atorvastatin (LIPITOR) 10 MG tablet Take 1 tablet (10 mg total) by mouth daily., Starting 07/08/2014, Until Discontinued, Normal    buPROPion (WELLBUTRIN) 100 MG tablet Take 1 tablet (100 mg total) by mouth 2 (two) times daily., Starting 07/08/2014, Until Discontinued, Normal    gabapentin (NEURONTIN) 100 MG capsule Take 1 capsule (100 mg total) by mouth daily., Starting 07/08/2014, Until Discontinued, Normal    hydrochlorothiazide (HYDRODIURIL) 12.5 MG tablet Take 1 tablet (12.5 mg total) by mouth daily., Starting 07/08/2014, Until Discontinued, Normal    Insulin Glargine (TOUJEO SOLOSTAR) 300 UNIT/ML SOPN Inject 16 Units into the skin daily with breakfast., Starting 07/08/2014, Until Discontinued, Print    lisinopril (PRINIVIL,ZESTRIL) 10 MG tablet Take 1 tablet (10 mg total) by mouth daily., Starting 07/08/2014, Until Discontinued, Normal    metFORMIN (GLUCOPHAGE XR) 500 MG 24 hr tablet Take 2 tablets (1,000 mg total) by mouth 2 (two) times daily with a meal., Starting 07/08/2014, Until Discontinued, Normal    metoprolol (LOPRESSOR) 50 MG tablet Take 1 tablet (50 mg total) by mouth 2 (two) times daily.,  Starting 07/08/2014, Until Discontinued, Normal    pantoprazole (PROTONIX) 40 MG tablet Take 1 tablet (40 mg total) by mouth daily., Starting 07/08/2014, Until Discontinued, Normal    senna-docusate (SENOKOT-S) 8.6-50 MG per tablet Take 1 tablet by mouth at bedtime as needed for mild constipation., Starting 03/23/2013, Until Discontinued, Print    nitroGLYCERIN (NITROSTAT) 0.4 MG SL tablet Place 1 tablet (0.4 mg total) under the tongue every 5 (five) minutes as needed  for chest pain., Starting 03/23/2013, Until Discontinued, Print      STOP taking these medications     traMADol (ULTRAM) 50 MG tablet      amoxicillin-clavulanate (AUGMENTIN) 875-125 MG per tablet      naproxen (NAPROSYN) 500 MG tablet        Allergies  Allergen Reactions  . Ciprofloxacin Rash   Follow-up Information    Follow up with Angelica Chessman, MD. Schedule an appointment as soon as possible for a visit in 1 week.   Specialty:  Internal Medicine   Contact information:   [place]  [phone]        The results of significant diagnostics from this hospitalization (including imaging, microbiology, ancillary and laboratory) are listed below for reference.    Significant Diagnostic Studies: Dg Chest 2 View  08/07/2014   CLINICAL DATA:  59 year old female with shortness of breath productive cough and congestion for 4 days. Current history of smoker. Initial encounter.  EXAM: CHEST  2 VIEW  COMPARISON:  03/22/2013 and earlier.  FINDINGS: Stable cardiac size and mediastinal contours. Stable sequelae of median sternotomy. No pleural effusion, pneumothorax, pulmonary edema or consolidation. Mildly increased streaky/interstitial opacity at the left lung base. This is only evident on the frontal view. No acute osseous abnormality identified. Stable cholecystectomy clips. Visualized tracheal air column is within normal limits.  IMPRESSION: Mildly increased streaky opacity at the left lung base compatible with bronchopneumonia in this setting. No pleural effusion.  Post treatment radiographs recommended to document resolution.   Electronically Signed   By: Genevie Ann M.D.   On: 08/07/2014 14:57   Ct Abdomen Pelvis W Contrast  08/09/2014   CLINICAL DATA:  Left lower quadrant pain  EXAM: CT ABDOMEN AND PELVIS WITH CONTRAST  TECHNIQUE: Multidetector CT imaging of the abdomen and pelvis was performed using the standard protocol following bolus administration of  intravenous contrast.  CONTRAST:  133mL OMNIPAQUE IOHEXOL 300 MG/ML  SOLN  COMPARISON:  11/21/2010  FINDINGS: Lung bases shows bilateral lower lobe patchy infiltrate suspicious for pneumonia.  *Sagittal images of the spine shows extensive degenerative changes thoracolumbar spine. Enhanced liver is unremarkable. The patient is status post cholecystectomy. The pancreas, spleen and adrenal glands are unremarkable. Enhanced kidneys are symmetrical in size. No hydronephrosis or hydroureter. There is a tiny cyst in midpole posterior aspect of the left kidney measures 6 mm.  Delayed renal images shows bilateral renal symmetrical excretion. No hydronephrosis or hydroureter. No aortic aneurysm.  No small bowel obstruction. Normal appendix. No pericecal inflammation. The terminal ileum is unremarkable. Few diverticula are noted descending colon. Colonic diverticula noted proximal sigmoid colon. There is no evidence of acute diverticulitis. The uterus and adnexa are unremarkable. The urinary bladder is unremarkable. Bilateral inguinal small nonspecific lymph nodes are noted.  There is asymmetric swelling of inferior aspect of the right rectus muscle. In axial image 74 there is a central hematoma within right rectus muscle measures 2.1 by 1.6 cm.  IMPRESSION: 1. There is asymmetric swelling inferior aspect of  the right rectus muscle best seen in axial image 74. There is a hematoma centered within right rectus muscle measures 2.1 x 1.6 cm. 2. Small nonspecific bilateral inguinal lymph nodes are noted. 3. Status postcholecystectomy. 4. No hydronephrosis or hydroureter. 5. No pericecal inflammation. Normal appendix. Unremarkable terminal ileum. 6. No small bowel obstruction. 7. Few diverticula are noted distal colon without evidence acute diverticulitis. 8. Patchy infiltrate bilateral lower lobe suspicious for pneumonia.   Electronically Signed   By: Lahoma Crocker M.D.   On: 08/09/2014 13:38    Microbiology: Recent Results (from  the past 240 hour(s))  Blood culture (routine x 2)     Status: None (Preliminary result)   Collection Time: 08/07/14  6:09 PM  Result Value Ref Range Status   Specimen Description BLOOD RIGHT ARM  Final   Special Requests BOTTLES DRAWN AEROBIC AND ANAEROBIC 10CC  Final   Culture   Final           BLOOD CULTURE RECEIVED NO GROWTH TO DATE CULTURE WILL BE HELD FOR 5 DAYS BEFORE ISSUING A FINAL NEGATIVE REPORT Performed at [facility]    Report Status PENDING  Incomplete  Blood culture (routine x 2)     Status: None (Preliminary result)   Collection Time: 08/07/14  6:09 PM  Result Value Ref Range Status   Specimen Description BLOOD RIGHT HAND  Final   Special Requests BOTTLES DRAWN AEROBIC AND ANAEROBIC 5CC  Final   Culture   Final           BLOOD CULTURE RECEIVED NO GROWTH TO DATE CULTURE WILL BE HELD FOR 5 DAYS BEFORE ISSUING A FINAL NEGATIVE REPORT Performed at [facility]    Report Status PENDING  Incomplete  Urine culture     Status: None   Collection Time: 08/07/14  7:25 PM  Result Value Ref Range Status   Specimen Description URINE, CLEAN CATCH  Final   Special Requests NONE  Final   Colony Count   Final    40,000 COLONIES/ML Performed at [facility]    Culture   Final    Multiple bacterial morphotypes present, none predominant. Suggest appropriate recollection if clinically indicated. Performed at [facility]    Report Status 08/09/2014 FINAL  Final  Culture, sputum-assessment     Status: None   Collection Time: 08/07/14  8:30 PM  Result Value Ref Range Status   Specimen Description SPUTUM  Final   Special Requests NONE  Final   Sputum evaluation   Final    THIS SPECIMEN IS ACCEPTABLE. RESPIRATORY CULTURE REPORT TO FOLLOW.   Report Status 08/07/2014 FINAL  Final  Culture, respiratory (NON-Expectorated)     Status: None   Collection Time: 08/07/14  8:30 PM  Result Value Ref Range Status   Specimen Description SPUTUM  Final    Special Requests NONE  Final   Gram Stain   Final    ABUNDANT WBC PRESENT,BOTH PMN AND MONONUCLEAR RARE SQUAMOUS EPITHELIAL CELLS PRESENT FEW GRAM POSITIVE COCCI IN PAIRS Performed at [facility]    Culture   Final    NORMAL OROPHARYNGEAL FLORA Performed at [facility]    Report Status 08/10/2014 FINAL  Final     Labs: Basic Metabolic Panel:  Recent Labs Lab 08/07/14 1420 08/08/14 0859 08/09/14 0707  NA 136 138 139  K 4.0 3.7 4.5  CL 104 108 110  CO2 20 21 22   GLUCOSE 257* 180* 186*  BUN 29* 22  15  CREATININE 1.63* 0.97 0.79  CALCIUM 8.3* 8.2* 8.3*   Liver Function Tests:  Recent Labs Lab 08/07/14 1420  AST 31  ALT 20  ALKPHOS 84  BILITOT 0.6  PROT 7.1  ALBUMIN 3.0*    Recent Labs Lab 08/07/14 1420  LIPASE 36   No results for input(s): AMMONIA in the last 168 hours. CBC:  Recent Labs Lab 08/07/14 1420 08/08/14 0859 08/09/14 0707 08/10/14 0612  WBC 8.3 6.8 4.7 6.3  NEUTROABS 6.6 4.3  --   --   HGB 10.0* 8.4* 8.1* 8.4*  HCT 30.5* 25.8* 25.4* 26.5*  MCV 78.8 79.6 79.9 80.3  PLT 275 244 269 335   Cardiac Enzymes: No results for input(s): CKTOTAL, CKMB, CKMBINDEX, TROPONINI in the last 168 hours. BNP: BNP (last 3 results) No results for input(s): BNP in the last 8760 hours.  ProBNP (last 3 results) No results for input(s): PROBNP in the last 8760 hours.  CBG:  Recent Labs Lab 08/09/14 0817 08/09/14 1208 08/09/14 1651 08/09/14 2145 08/10/14 0825  GLUCAP 181* 210* 148* 290* 166*       Signed:  Irma Delancey  [facility] 08/10/2014, 3:03 PM

## 2014-08-13 LAB — HAEMOPHILIUS INFLUENZAE B AB IGG

## 2014-08-14 LAB — CULTURE, BLOOD (ROUTINE X 2)
CULTURE: NO GROWTH
CULTURE: NO GROWTH

## 2014-08-19 ENCOUNTER — Ambulatory Visit (HOSPITAL_COMMUNITY)
Admission: RE | Admit: 2014-08-19 | Discharge: 2014-08-19 | Disposition: A | Discharge status: Home / Self Care | Payer: No Typology Code available for payment source | Source: Ambulatory Visit | Attending: Internal Medicine | Admitting: Internal Medicine

## 2014-08-19 DIAGNOSIS — Z0000 Encounter for general adult medical examination without abnormal findings: Secondary | ICD-10-CM

## 2014-08-19 DIAGNOSIS — Z1231 Encounter for screening mammogram for malignant neoplasm of breast: Secondary | ICD-10-CM | POA: Insufficient documentation

## 2014-08-22 ENCOUNTER — Telehealth: Payer: Self-pay

## 2014-08-22 NOTE — Telephone Encounter (Signed)
-----   Message from Tresa Garter, MD sent at 08/21/2014  6:26 PM EDT ----- Please inform patient that her mammogram shows no evidence of malignancy. Recommend screening mammogram in one year

## 2014-08-22 NOTE — Telephone Encounter (Signed)
Patient is aware of her mammogram results 

## 2014-08-23 ENCOUNTER — Ambulatory Visit: Payer: Self-pay | Admitting: Neurology

## 2014-08-23 DIAGNOSIS — Z029 Encounter for administrative examinations, unspecified: Secondary | ICD-10-CM

## 2014-08-26 ENCOUNTER — Encounter: Payer: Self-pay | Admitting: Neurology

## 2014-10-14 ENCOUNTER — Other Ambulatory Visit: Payer: Self-pay

## 2015-02-06 ENCOUNTER — Ambulatory Visit: Payer: Self-pay | Admitting: Internal Medicine

## 2015-02-17 ENCOUNTER — Other Ambulatory Visit: Payer: Self-pay | Admitting: Internal Medicine

## 2015-02-17 NOTE — Telephone Encounter (Signed)
Pt. Called requesting a med refill on Insulin Glargine (TOUJEO SOLOSTAR) 300 UNIT/ML SOPN. Pt. Stated she is out of her med. Please f/u with pt.

## 2015-02-17 NOTE — Telephone Encounter (Signed)
Patients insulin has been refilled with 3 refills.  Patient needs to schedule appointment for future refills.

## 2015-02-20 ENCOUNTER — Other Ambulatory Visit: Payer: Self-pay | Admitting: Internal Medicine

## 2015-02-20 MED ORDER — INSULIN GLARGINE 300 UNIT/ML ~~LOC~~ SOPN: 16.0000 [IU] | PEN_INJECTOR | Freq: Every day | SUBCUTANEOUS | Status: DC

## 2015-02-20 NOTE — Telephone Encounter (Signed)
Patient called and requested a med refill for her insulin, please f/u

## 2015-02-20 NOTE — Telephone Encounter (Signed)
Nurse called patient, patient verified date of birth. Patient explains pharmacy does not have prescription.  Nurse called pharmacy. CMA sent prescription on 02/17/15 at 12:03pm, pharmacy did not receive prescription.  Patient aware of nurse resending prescription to pharmacy.

## 2015-02-27 ENCOUNTER — Ambulatory Visit: Payer: No Typology Code available for payment source | Attending: Internal Medicine | Admitting: Internal Medicine

## 2015-02-27 ENCOUNTER — Encounter: Payer: Self-pay | Admitting: Internal Medicine

## 2015-02-27 VITALS — BP 138/69 | HR 78 | Temp 98.6°F | Resp 20 | Ht 66.0 in | Wt 236.0 lb

## 2015-02-27 DIAGNOSIS — E1141 Type 2 diabetes mellitus with diabetic mononeuropathy: Secondary | ICD-10-CM | POA: Insufficient documentation

## 2015-02-27 DIAGNOSIS — E118 Type 2 diabetes mellitus with unspecified complications: Secondary | ICD-10-CM | POA: Insufficient documentation

## 2015-02-27 DIAGNOSIS — Z794 Long term (current) use of insulin: Secondary | ICD-10-CM | POA: Insufficient documentation

## 2015-02-27 DIAGNOSIS — I10 Essential (primary) hypertension: Secondary | ICD-10-CM

## 2015-02-27 DIAGNOSIS — J189 Pneumonia, unspecified organism: Secondary | ICD-10-CM | POA: Insufficient documentation

## 2015-02-27 DIAGNOSIS — F329 Major depressive disorder, single episode, unspecified: Secondary | ICD-10-CM

## 2015-02-27 DIAGNOSIS — Z0000 Encounter for general adult medical examination without abnormal findings: Secondary | ICD-10-CM

## 2015-02-27 DIAGNOSIS — F32A Depression, unspecified: Secondary | ICD-10-CM

## 2015-02-27 LAB — COMPLETE METABOLIC PANEL WITH GFR
ALT: 9 U/L (ref 6–29)
AST: 12 U/L (ref 10–35)
Albumin: 3.8 g/dL (ref 3.6–5.1)
Alkaline Phosphatase: 98 U/L (ref 33–130)
BUN: 18 mg/dL (ref 7–25)
CHLORIDE: 103 mmol/L (ref 98–110)
CO2: 26 mmol/L (ref 20–31)
CREATININE: 1.02 mg/dL (ref 0.50–1.05)
Calcium: 9.7 mg/dL (ref 8.6–10.4)
GFR, Est African American: 71 mL/min (ref >=60)
GFR, Est Non African American: 61 mL/min (ref >=60)
GLUCOSE: 227 mg/dL — AB (ref 65–99)
POTASSIUM: 5.9 mmol/L — AB (ref 3.5–5.3)
SODIUM: 138 mmol/L (ref 135–146)
Total Bilirubin: 0.2 mg/dL (ref 0.2–1.2)
Total Protein: 7 g/dL (ref 6.1–8.1)

## 2015-02-27 LAB — CBC WITH DIFFERENTIAL/PLATELET
Basophils Absolute: 0 10*3/uL (ref 0.0–0.1)
Basophils Relative: 0 % (ref 0–1)
Eosinophils Absolute: 0.3 10*3/uL (ref 0.0–0.7)
Eosinophils Relative: 3 % (ref 0–5)
HEMATOCRIT: 31.4 % — AB (ref 36.0–46.0)
HEMOGLOBIN: 9.5 g/dL — AB (ref 12.0–15.0)
LYMPHS ABS: 3 10*3/uL (ref 0.7–4.0)
LYMPHS PCT: 35 % (ref 12–46)
MCH: 23.9 pg — ABNORMAL LOW (ref 26.0–34.0)
MCHC: 30.3 g/dL (ref 30.0–36.0)
MCV: 78.9 fL (ref 78.0–100.0)
MONO ABS: 0.6 10*3/uL (ref 0.1–1.0)
MPV: 9.7 fL (ref 8.6–12.4)
Monocytes Relative: 7 % (ref 3–12)
Neutro Abs: 4.7 10*3/uL (ref 1.7–7.7)
Neutrophils Relative %: 55 % (ref 43–77)
PLATELETS: 429 10*3/uL — AB (ref 150–400)
RBC: 3.98 MIL/uL (ref 3.87–5.11)
RDW: 16.8 % — AB (ref 11.5–15.5)
WBC: 8.6 10*3/uL (ref 4.0–10.5)

## 2015-02-27 LAB — LIPID PANEL
CHOL/HDL RATIO: 2 ratio (ref <=5.0)
Cholesterol: 132 mg/dL (ref 125–200)
HDL: 67 mg/dL (ref >=46)
LDL CALC: 39 mg/dL (ref <130)
Triglycerides: 128 mg/dL (ref <150)
VLDL: 26 mg/dL (ref <30)

## 2015-02-27 LAB — POCT GLYCOSYLATED HEMOGLOBIN (HGB A1C): Hemoglobin A1C: 10.1

## 2015-02-27 LAB — GLUCOSE, POCT (MANUAL RESULT ENTRY): POC GLUCOSE: 281 mg/dL — AB (ref 70–99)

## 2015-02-27 MED ORDER — INSULIN GLARGINE 300 UNIT/ML ~~LOC~~ SOPN
25.0000 [IU] | PEN_INJECTOR | Freq: Every day | SUBCUTANEOUS | Status: DC
Start: 2015-02-27 — End: 2015-04-25

## 2015-02-27 MED ORDER — GABAPENTIN 300 MG PO CAPS: 300.0000 mg | ORAL_CAPSULE | Freq: Three times a day (TID) | ORAL | Status: DC

## 2015-02-27 NOTE — Patient Instructions (Signed)
Diabetes and Exercise Exercising regularly is important. It is not just about losing weight. It has many health benefits, such as:  Improving your overall fitness, flexibility, and endurance.  Increasing your bone density.  Helping with weight control.  Decreasing your body fat.  Increasing your muscle strength.  Reducing stress and tension.  Improving your overall health. People with diabetes who exercise gain additional benefits because exercise:  Reduces appetite.  Improves the body's use of blood sugar (glucose).  Helps lower or control blood glucose.  Decreases blood pressure.  Helps control blood lipids (such as cholesterol and triglycerides).  Improves the body's use of the hormone insulin by:  Increasing the body's insulin sensitivity.  Reducing the body's insulin needs.  Decreases the risk for heart disease because exercising:  Lowers cholesterol and triglycerides levels.  Increases the levels of good cholesterol (such as high-density lipoproteins [HDL]) in the body.  Lowers blood glucose levels. YOUR ACTIVITY PLAN  Choose an activity that you enjoy, and set realistic goals. To exercise safely, you should begin practicing any new physical activity slowly, and gradually increase the intensity of the exercise over time. Your health care provider or diabetes educator can help create an activity plan that works for you. General recommendations include:  Encouraging children to engage in at least 60 minutes of physical activity each day.  Stretching and performing strength training exercises, such as yoga or weight lifting, at least 2 times per week.  Performing a total of at least 150 minutes of moderate-intensity exercise each week, such as brisk walking or water aerobics.  Exercising at least 3 days per week, making sure you allow no more than 2 consecutive days to pass without exercising.  Avoiding long periods of inactivity (90 minutes or more). When you  have to spend an extended period of time sitting down, take frequent breaks to walk or stretch. RECOMMENDATIONS FOR EXERCISING WITH TYPE 1 OR TYPE 2 DIABETES   Check your blood glucose before exercising. If blood glucose levels are greater than 240 mg/dL, check for urine ketones. Do not exercise if ketones are present.  Avoid injecting insulin into areas of the body that are going to be exercised. For example, avoid injecting insulin into:  The arms when playing tennis.  The legs when jogging.  Keep a record of:  Food intake before and after you exercise.  Expected peak times of insulin action.  Blood glucose levels before and after you exercise.  The type and amount of exercise you have done.  Review your records with your health care provider. Your health care provider will help you to develop guidelines for adjusting food intake and insulin amounts before and after exercising.  If you take insulin or oral hypoglycemic agents, watch for signs and symptoms of hypoglycemia. They include:  Dizziness.  Shaking.  Sweating.  Chills.  Confusion.  Drink plenty of water while you exercise to prevent dehydration or heat stroke. Body water is lost during exercise and must be replaced.  Talk to your health care provider before starting an exercise program to make sure it is safe for you. Remember, almost any type of activity is better than none.   This information is not intended to replace advice given to you by your health care provider. Make sure you discuss any questions you have with your health care provider.   Document Released: 06/26/2003 Document Revised: 08/20/2014 Document Reviewed: 09/12/2012 Elsevier Interactive Patient Education 2016 [employer] Basic Carbohydrate Counting for Diabetes Mellitus Carbohydrate counting   is a method for keeping track of the amount of carbohydrates you eat. Eating carbohydrates naturally increases the level of sugar (glucose) in your  blood, so it is important for you to know the amount that is okay for you to have in every meal. Carbohydrate counting helps keep the level of glucose in your blood within normal limits. The amount of carbohydrates allowed is different for every person. A dietitian can help you calculate the amount that is right for you. Once you know the amount of carbohydrates you can have, you can count the carbohydrates in the foods you want to eat. Carbohydrates are found in the following foods:  Grains, such as breads and cereals.  Dried beans and soy products.  Starchy vegetables, such as potatoes, peas, and corn.  Fruit and fruit juices.  Milk and yogurt.  Sweets and snack foods, such as cake, cookies, candy, chips, soft drinks, and fruit drinks. CARBOHYDRATE COUNTING There are two ways to count the carbohydrates in your food. You can use either of the methods or a combination of both. Reading the "Nutrition Facts" on Packaged Food The "Nutrition Facts" is an area that is included on the labels of almost all packaged food and beverages in the [place]. It includes the serving size of that food or beverage and information about the nutrients in each serving of the food, including the grams (g) of carbohydrate per serving.  Decide the number of servings of this food or beverage that you will be able to eat or drink. Multiply that number of servings by the number of grams of carbohydrate that is listed on the label for that serving. The total will be the amount of carbohydrates you will be having when you eat or drink this food or beverage. Learning Standard Serving Sizes of Food When you eat food that is not packaged or does not include "Nutrition Facts" on the label, you need to measure the servings in order to count the amount of carbohydrates.A serving of most carbohydrate-rich foods contains about 15 g of carbohydrates. The following list includes serving sizes of carbohydrate-rich foods that  provide 15 g ofcarbohydrate per serving:   1 slice of bread (1 oz) or 1 six-inch tortilla.    of a hamburger bun or English muffin.  4-6 crackers.   cup unsweetened dry cereal.    cup hot cereal.   cup rice or pasta.    cup mashed potatoes or  of a large baked potato.  1 cup fresh fruit or one small piece of fruit.    cup canned or frozen fruit or fruit juice.  1 cup milk.   cup plain fat-free yogurt or yogurt sweetened with artificial sweeteners.   cup cooked dried beans or starchy vegetable, such as peas, corn, or potatoes.  Decide the number of standard-size servings that you will eat. Multiply that number of servings by 15 (the grams of carbohydrates in that serving). For example, if you eat 2 cups of strawberries, you will have eaten 2 servings and 30 g of carbohydrates (2 servings x 15 g = 30 g). For foods such as soups and casseroles, in which more than one food is mixed in, you will need to count the carbohydrates in each food that is included. EXAMPLE OF CARBOHYDRATE COUNTING Sample Dinner  3 oz chicken breast.   cup of brown rice.   cup of corn.  1 cup milk.   1 cup strawberries with sugar-free whipped topping.  Carbohydrate Calculation Step   1: Identify the foods that contain carbohydrates:   Rice.   Corn.   Milk.   Strawberries. Step 2:Calculate the number of servings eaten of each:   2 servings of rice.   1 serving of corn.   1 serving of milk.   1 serving of strawberries. Step 3: Multiply each of those number of servings by 15 g:   2 servings of rice x 15 g = 30 g.   1 serving of corn x 15 g = 15 g.   1 serving of milk x 15 g = 15 g.   1 serving of strawberries x 15 g = 15 g. Step 4: Add together all of the amounts to find the total grams of carbohydrates eaten: 30 g + 15 g + 15 g + 15 g = 75 g.   This information is not intended to replace advice given to you by your health care provider. Make sure you  discuss any questions you have with your health care provider.   Document Released: 04/05/2005 Document Revised: 04/26/2014 Document Reviewed: 03/02/2013 Elsevier Interactive Patient Education 2016 Elsevier Inc.  

## 2015-02-27 NOTE — Progress Notes (Signed)
Patient here for 3 month F/U DM  Patient denies pain at this time.  Patient requesting refill on Protonix

## 2015-02-27 NOTE — Progress Notes (Signed)
Patient ID: Deborah Coffey, female   DOB: Jun 16, 1955, 59 y.o.   MRN: [id number]   Deborah Coffey, is a 59 y.o. female  [id number]  [id number]  DOB - December 27, 1955  Chief Complaint  Patient presents with  . Follow-up        Subjective:   Deborah Coffey is a 59 y.o. female with history of coronary artery disease status post CABG, hypertension, dyslipidemia, type 2 diabetes mellitus, and possible diabetic neuropathy here today for a follow up visit. She has no new complaints today. She is requesting a refill of her medications. She is however concerned about her ongoing tingling and pain of both feet from her diabetic neuropathy. She has not been entirely compliant with her insulin injection, blood sugar has been out of control for some time. Her last hemoglobin A1c was 8.7%, but today it has gone up to 10.1%. Patient does not exercise as prescribed. She is depressed but she denies any suicidal ideation or thoughts. She said her Wellbutrin is helping. She is currently on Lantus insulin 16 units in a.m. Patient has No headache, No chest pain, No abdominal pain - No Nausea, No new weakness tingling or numbness, No Cough - SOB.  Problem  Type 2 Diabetes Mellitus With Complication, With Long-Term Current Use of Insulin (Hcc)  Diabetic mononeuropathy associated with type 2 diabetes mellitus (HCC)    ALLERGIES: Allergies  Allergen Reactions  . Ciprofloxacin Rash    PAST MEDICAL HISTORY: Past Medical History  Diagnosis Date  . CAD in native artery 02/2004    Mid LAD lesoin just after Large D1 --> CABG x 1 LIMA-LAD  . 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 competitive flow  . Hypertension   . High cholesterol   . Type II diabetes mellitus ([place])   . Anemia   . History of blood transfusion 2005    "related to [facility]"   . Allergy   . CAP (community acquired pneumonia) 08/07/2014  . Chronic bronchitis ([place])     "I think I get  it q yr" (08/07/2014)  . Depression     MEDICATIONS AT HOME: Prior to Admission medications   Medication Sig Start Date End Date Taking? Authorizing Provider  aspirin 81 MG tablet Take 1 tablet (81 mg total) by mouth daily. 02/14/13  Yes Ripudeep Krystal Eaton, MD  atorvastatin (LIPITOR) 10 MG tablet Take 1 tablet (10 mg total) by mouth daily. 07/08/14  Yes Tresa Garter, MD  buPROPion (WELLBUTRIN) 100 MG tablet Take 1 tablet (100 mg total) by mouth 2 (two) times daily. 07/08/14  Yes Tresa Garter, MD  gabapentin (NEURONTIN) 300 MG capsule Take 1 capsule (300 mg total) by mouth 3 (three) times daily. 02/27/15  Yes Tresa Garter, MD  hydrochlorothiazide (HYDRODIURIL) 12.5 MG tablet Take 1 tablet (12.5 mg total) by mouth daily. 07/08/14  Yes Tresa Garter, MD  Insulin Glargine (TOUJEO SOLOSTAR) 300 UNIT/ML SOPN Inject 25 Units into the skin daily with breakfast. 02/27/15  Yes Tresa Garter, MD  levofloxacin (LEVAQUIN) 500 MG tablet Take 1 tablet (500 mg total) by mouth daily. For 4days 08/10/14  Yes Domenic Polite, MD  lisinopril (PRINIVIL,ZESTRIL) 10 MG tablet Take 1 tablet (10 mg total) by mouth daily. 07/08/14  Yes Tresa Garter, MD  metFORMIN (GLUCOPHAGE XR) 500 MG 24 hr tablet Take 2 tablets (1,000 mg total) by mouth 2 (two) times daily with a meal. 07/08/14  Yes  Tresa Garter, MD  metoprolol (LOPRESSOR) 50 MG tablet Take 1 tablet (50 mg total) by mouth 2 (two) times daily. 07/08/14  Yes Tresa Garter, MD  nitroGLYCERIN (NITROSTAT) 0.4 MG SL tablet Place 1 tablet (0.4 mg total) under the tongue every 5 (five) minutes as needed for chest pain. 03/23/13  Yes Ripudeep Krystal Eaton, MD  senna-docusate (SENOKOT-S) 8.6-50 MG per tablet Take 1 tablet by mouth at bedtime as needed for mild constipation. 03/23/13  Yes Ripudeep Krystal Eaton, MD  pantoprazole (PROTONIX) 40 MG tablet Take 1 tablet (40 mg total) by mouth daily. Patient not taking: Reported on 02/27/2015 07/08/14   Tresa Garter, MD     Objective:   Filed Vitals:   02/27/15 1220  BP: 138/69  Pulse: 78  Temp: 98.6 F (37 C)  TempSrc: Oral  Resp: 20  Height: 5\' 6"  (1.676 m)  Weight: 236 lb (107.049 kg)  SpO2: 100%    Exam General appearance : Awake, alert, not in any distress. Speech Clear. Not toxic looking HEENT: Atraumatic and Normocephalic, pupils equally reactive to light and accomodation Neck: supple, no JVD. No cervical lymphadenopathy.  Chest:Good air entry bilaterally, no added sounds  CVS: S1 S2 regular, no murmurs.  Abdomen: Bowel sounds present, Non tender and not distended with no gaurding, rigidity or rebound. Extremities: B/L Lower Ext shows no edema, both legs are warm to touch Neurology: Awake alert, and oriented X 3, CN II-XII intact, Non focal Skin:No Rash  Data Review Lab Results  Component Value Date   HGBA1C 10.10 02/27/2015   HGBA1C 8.7 07/08/2014   HGBA1C 12.9* 01/02/2014     Assessment & Plan   1. Essential hypertension  We have discussed target BP range and blood pressure goal. I have advised patient to check BP regularly and to call us back or report to clinic if the numbers are consistently higher than 140/90. We discussed the importance of compliance with medical therapy and DASH diet recommended, consequences of uncontrolled hypertension discussed.   - continue current BP medications  2. Depression (emotion) Continue Wellbutrin Patient counseled She denies any suicidal ideation or thoughts  3. Type 2 diabetes mellitus with complication, with long-term current use of insulin (HCC)  - POCT A1C - Glucose (CBG) - Microalbumin/Creatinine Ratio, Urine - CBC with Differential/Platelet - COMPLETE METABOLIC PANEL WITH GFR - Lipid panel - Urinalysis, Complete - gabapentin (NEURONTIN) 300 MG capsule; Take 1 capsule (300 mg total) by mouth 3 (three) times daily.  Dispense: 90 capsule; Refill: 3 Increase Lantus insulin to 25 units from 16 units before  breakfast .- Insulin Glargine (TOUJEO SOLOSTAR) 300 UNIT/ML SOPN; Inject 25 Units into the skin daily with breakfast.  Dispense: 3 mL; Refill: 3  Aim for 30 minutes of exercise most days. Rethink what you drink. Water is great! Aim for 2-3 Carb Choices per meal (30-45 grams) +/- 1 either way  Aim for 0-15 Carbs per snack if hungry  Include protein in moderation with your meals and snacks  Consider reading food labels for Total Carbohydrate and Fat Grams of foods  Consider checking BG at alternate times per day  Continue taking medication as directed Be mindful about how much sugar you are adding to beverages and other foods. Fruit Punch - find one with no sugar  Measure and decrease portions of carbohydrate foods  Make your plate and don't go back for seconds   4. Diabetic mononeuropathy associated with type 2 diabetes mellitus (HCC)  - gabapentin (  NEURONTIN) 300 MG capsule; Take 1 capsule (300 mg total) by mouth 3 (three) times daily.  Dispense: 90 capsule; Refill: 3  Patient have been counseled extensively about nutrition and exercise  Return in about 2 weeks (around 03/13/2015) for CPP [facility] for BP/CBG and DM management.  The patient was given clear instructions to go to ER or return to medical center if symptoms don't improve, worsen or new problems develop. The patient verbalized understanding. The patient was told to call to get lab results if they haven't heard anything in the next week.   This note has been created with Surveyor, quantity. Any transcriptional errors are unintentional.    Angelica Chessman, MD, [place], [place], [place], [place] and [place], [place]   02/27/2015, 12:44 PM

## 2015-02-28 LAB — URINALYSIS, COMPLETE
Bacteria, UA: NONE SEEN [HPF]
Bilirubin Urine: NEGATIVE
Casts: NONE SEEN [LPF]
Crystals: NONE SEEN [HPF]
HGB URINE DIPSTICK: NEGATIVE
Ketones, ur: NEGATIVE
LEUKOCYTES UA: NEGATIVE
NITRITE: NEGATIVE
PH: 5 (ref 5.0–8.0)
Specific Gravity, Urine: 1.017 (ref 1.001–1.035)
YEAST: NONE SEEN [HPF]

## 2015-02-28 LAB — MICROALBUMIN / CREATININE URINE RATIO
Creatinine, Urine: 117 mg/dL (ref 20–320)
Microalb Creat Ratio: 98 mcg/mg creat — ABNORMAL HIGH (ref <30)
Microalb, Ur: 11.5 mg/dL

## 2015-03-12 ENCOUNTER — Ambulatory Visit: Payer: No Typology Code available for payment source | Attending: Internal Medicine | Admitting: Pharmacist

## 2015-03-12 ENCOUNTER — Encounter (HOSPITAL_BASED_OUTPATIENT_CLINIC_OR_DEPARTMENT_OTHER): Payer: No Typology Code available for payment source | Admitting: Clinical

## 2015-03-12 DIAGNOSIS — F329 Major depressive disorder, single episode, unspecified: Secondary | ICD-10-CM

## 2015-03-12 DIAGNOSIS — I10 Essential (primary) hypertension: Secondary | ICD-10-CM

## 2015-03-12 DIAGNOSIS — E1165 Type 2 diabetes mellitus with hyperglycemia: Secondary | ICD-10-CM

## 2015-03-12 DIAGNOSIS — F32A Depression, unspecified: Secondary | ICD-10-CM

## 2015-03-12 MED ORDER — PANTOPRAZOLE SODIUM 40 MG PO TBEC: 40.0000 mg | DELAYED_RELEASE_TABLET | Freq: Every day | ORAL | Status: DC

## 2015-03-12 NOTE — Patient Instructions (Addendum)
Thank you for coming to see me today!  Start trying to cut back on the carbs and eat smaller portions.  If you can get up and walk some after each meal - that would be great too!  Come back and see me in two weeks  Please return sooner if your symptoms worsen

## 2015-03-12 NOTE — Progress Notes (Signed)
S:    Patient arrives in good spirits.  Presents for diabetes follow up.  Patient reports adherence with medications. Current diabetes medications include Toujeo 25 units daily and metformin 1000 mg BID  Patient reports hypoglycemic events. This is a subjective event and she did not measure her blood glucose at this time.   Patient reported dietary habits: she reports that she really doesn't understand what she is supposed to eat and what she is not supposed to eat.  Patient reported exercise habits: none   Patient reports nocturia 3-4 times per night.  Patient reports neuropathy. Patient denies visual changes. Patient reports daily self foot exams.   Patient reports continued reflux and didn't receive a refill of her pantoprazole at her last visit. She would like a refill to see if that helps with the burning sensation that she is having after eating.   O:  Lab Results  Component Value Date   HGBA1C 10.10 02/27/2015    Per patient report (she did not bring meter or log book to visit) Home fasting CBG: 200s 2 hour post-prandial/random CBG: none.  A/P: Diabetes currently uncontrolled based on A1c of 10.10 and home CBG readings.   Patient reports hypoglycemic events and is able to verbalize appropriate hypoglycemia management plan.  Patient reports adherence with medication. Control is suboptimal due to dietary indiscretion and sedentary lifestyle.  Continue Toujeo 25 units every day and metformin 100 mg BID. Provided extensive diabetes education, with a focus on lifestyle changes pertaining to her diet. Patient would like to incorporate these changes first and then increase the insulin if needed. I think this would be good for her to see the impact of the lifestyle changes on her blood sugar to hopefully motivate her to make these changes. However, will plan to increase Toujeo in two weeks if she has been unable to lower her blood glucose based on dietary changes.  Pantoprazole  refilled - no warning signs of MI. Patient to return or go to ED if symptoms worsen.  Next A1C anticipated February 2017.    Written patient instructions provided including written education on A1c, hypoglycemia, and meal planning.  Total time in face to face counseling 35 minutes.  Follow up in Pharmacist Clinic Visit in two weeks.

## 2015-03-12 NOTE — Progress Notes (Signed)
ASSESSMENT: Pt currently experiencing primarily symptoms of depression, with some anxiety symptoms. Pt needs to f/u with PCP and [facility]; would benefit from psychoeducation and supportive counseling regarding coping with symptoms of depression (and anxiety).  Stage of Change: contemplative  PLAN: 1. F/U with behavioral health consultant in as needed 2. Psychiatric Medications: Wellbutrin, Neurontin. 3. Behavioral recommendation(s):   -Consider importance of self-care  -Consider reading educational material regarding coping with symptoms of depression (and anxiety) -Enjoy holiday weekend with family SUBJECTIVE: Pt. referred by Dr Doreene Burke for symptoms of anxiety and depression(at last visit):  Pt. reports the following symptoms/concerns: Pt states that she feels overwhelmed at times because of her health and taking care of others, that she is taking medications that help some, but knows she needs to take better care of herself.  Duration of problem: undetermined Severity: moderate  OBJECTIVE: Orientation & Cognition: Oriented x3. Thought processes normal and appropriate to situation. Mood: teary. Affect: appropriate Appearance: appropriate Risk of harm to self or others: no known risk of harm to self or others Substance use: none Assessments administered: (@last  visit, PHQ:19/ GAD7: 10), none today  Diagnosis: Depression CPT Code: F32.9 -------------------------------------------- Other(s) present in the room: none  Time spent with patient in exam room: 16 minutes

## 2015-03-19 ENCOUNTER — Ambulatory Visit: Payer: Self-pay

## 2015-03-26 ENCOUNTER — Encounter: Payer: Self-pay | Admitting: Pharmacist

## 2015-04-09 ENCOUNTER — Encounter: Payer: Self-pay | Admitting: Pharmacist

## 2015-04-10 ENCOUNTER — Ambulatory Visit: Payer: No Typology Code available for payment source | Attending: Internal Medicine | Admitting: Pharmacist

## 2015-04-10 DIAGNOSIS — E1165 Type 2 diabetes mellitus with hyperglycemia: Secondary | ICD-10-CM | POA: Insufficient documentation

## 2015-04-10 DIAGNOSIS — Z794 Long term (current) use of insulin: Secondary | ICD-10-CM | POA: Insufficient documentation

## 2015-04-10 DIAGNOSIS — Z79899 Other long term (current) drug therapy: Secondary | ICD-10-CM | POA: Insufficient documentation

## 2015-04-10 LAB — GLUCOSE, POCT (MANUAL RESULT ENTRY): POC GLUCOSE: 104 mg/dL — AB (ref 70–99)

## 2015-04-10 MED ORDER — TRUE METRIX METER W/DEVICE KIT: PACK | Status: DC

## 2015-04-10 MED ORDER — TRUEPLUS LANCETS 28G MISC: Status: DC

## 2015-04-10 MED ORDER — GLUCOSE BLOOD VI STRP: ORAL_STRIP | Status: DC

## 2015-04-10 NOTE — Progress Notes (Signed)
S:    Patient arrives in good spirits.  Presents for diabetes follow up.  Patient reports adherence with medications. Current diabetes medications include Toujeo 25 units daily and metformin 1000 mg BID  Patient denies hypoglycemic events.   Patient reported dietary habits: she has been working on changing her eating habits and has cut out sweets and fried foods. She reports that the dietary counseling at her last visit with me really helped and she is very motivated to eat better.  Patient reported exercise habits: moving around more   Patient denies nocturia. Patient denies neuropathy. Patient denies visual changes. Patient reports daily self foot exams.   She is very excited that her nocturia has gone away.  Patient reports that her grandson broke her meter so she has not had a working meter since our last visit.   O:  Lab Results  Component Value Date   HGBA1C 10.10 02/27/2015    POCT glucose = 104  A/P: Diabetes currently uncontrolled based on A1c of 10.10 but due to improvement in nocturia and neuropathy - I think her blood glucose readings are a lot better..   Patient denies hypoglycemic events and is able to verbalize appropriate hypoglycemia management plan.  Patient reports adherence with medication. Control is suboptimal due to dietary indiscretion and sedentary lifestyle.  Continue Toujeo 25 units every day and metformin 100 mg BID. Continue lifestyle modifications. Congratulated patient on progress - she is very happy with how she is feeling and her improvement in symptoms. Ordered patient a new meter and instructed her to pick it up today and to resume checking her blood glucose.  Follow up in Pharmacist Clinic Visit if blood glucose >200 or <80, otherwise, next visit with Dr. Doreene Burke.   Next A1C anticipated February 2017.    Written patient instructions provided including written education on A1c, hypoglycemia, and meal planning.  Total time in face to face counseling  35 minutes.

## 2015-04-10 NOTE — Patient Instructions (Signed)
It sounds like your blood sugars are doing much better!  Pick up the new meter at our pharmacy  If you see blood sugar readings >200 or <80, come back and see me in January.  Otherwise, follow up with Dr. Doreene Burke in February

## 2015-04-22 MED FILL — TOUJEO SOLOSTAR 300 UNITS/M: 300 | 30 days supply | Qty: 5 | Fill #1

## 2015-04-23 ENCOUNTER — Other Ambulatory Visit: Payer: Self-pay

## 2015-04-23 DIAGNOSIS — Z794 Long term (current) use of insulin: Principal | ICD-10-CM

## 2015-04-23 DIAGNOSIS — E118 Type 2 diabetes mellitus with unspecified complications: Secondary | ICD-10-CM

## 2015-04-25 ENCOUNTER — Other Ambulatory Visit: Payer: Self-pay

## 2015-04-25 DIAGNOSIS — Z794 Long term (current) use of insulin: Principal | ICD-10-CM

## 2015-04-25 DIAGNOSIS — E118 Type 2 diabetes mellitus with unspecified complications: Secondary | ICD-10-CM

## 2015-04-25 MED ORDER — INSULIN GLARGINE 300 UNIT/ML ~~LOC~~ SOPN: 25.0000 [IU] | PEN_INJECTOR | Freq: Every day | SUBCUTANEOUS | Status: DC

## 2015-05-07 ENCOUNTER — Other Ambulatory Visit: Payer: Self-pay | Admitting: Internal Medicine

## 2015-05-07 DIAGNOSIS — Z794 Long term (current) use of insulin: Principal | ICD-10-CM

## 2015-05-07 DIAGNOSIS — E118 Type 2 diabetes mellitus with unspecified complications: Secondary | ICD-10-CM

## 2015-05-07 MED ORDER — INSULIN GLARGINE 300 UNIT/ML ~~LOC~~ SOPN: 25.0000 [IU] | PEN_INJECTOR | Freq: Every day | SUBCUTANEOUS | Status: DC

## 2015-05-26 ENCOUNTER — Other Ambulatory Visit: Payer: Self-pay | Admitting: Internal Medicine

## 2015-05-26 MED FILL — METFORMIN HCL ER 500 MG TAB: 500 | 30 days supply | Qty: 120 | Fill #0

## 2015-05-27 MED FILL — METOPROLOL TARTRATE 50 MG T: 50 | 90 days supply | Qty: 180 | Fill #6

## 2015-06-15 ENCOUNTER — Encounter (HOSPITAL_COMMUNITY): Payer: Self-pay | Admitting: Emergency Medicine

## 2015-06-15 ENCOUNTER — Emergency Department (HOSPITAL_COMMUNITY)
Admission: EM | Admit: 2015-06-15 | Discharge: 2015-06-15 | Disposition: A | Discharge status: Home / Self Care | Payer: Self-pay | Attending: Emergency Medicine | Admitting: Emergency Medicine

## 2015-06-15 ENCOUNTER — Emergency Department (HOSPITAL_COMMUNITY): Payer: Self-pay

## 2015-06-15 DIAGNOSIS — Z8701 Personal history of pneumonia (recurrent): Secondary | ICD-10-CM | POA: Insufficient documentation

## 2015-06-15 DIAGNOSIS — Z7982 Long term (current) use of aspirin: Secondary | ICD-10-CM | POA: Insufficient documentation

## 2015-06-15 DIAGNOSIS — E119 Type 2 diabetes mellitus without complications: Secondary | ICD-10-CM | POA: Insufficient documentation

## 2015-06-15 DIAGNOSIS — Z9889 Other specified postprocedural states: Secondary | ICD-10-CM | POA: Insufficient documentation

## 2015-06-15 DIAGNOSIS — R079 Chest pain, unspecified: Secondary | ICD-10-CM | POA: Insufficient documentation

## 2015-06-15 DIAGNOSIS — R1013 Epigastric pain: Secondary | ICD-10-CM | POA: Insufficient documentation

## 2015-06-15 DIAGNOSIS — Z862 Personal history of diseases of the blood and blood-forming organs and certain disorders involving the immune mechanism: Secondary | ICD-10-CM | POA: Insufficient documentation

## 2015-06-15 DIAGNOSIS — Z794 Long term (current) use of insulin: Secondary | ICD-10-CM | POA: Insufficient documentation

## 2015-06-15 DIAGNOSIS — R0602 Shortness of breath: Secondary | ICD-10-CM | POA: Insufficient documentation

## 2015-06-15 DIAGNOSIS — R0789 Other chest pain: Secondary | ICD-10-CM

## 2015-06-15 DIAGNOSIS — Z7984 Long term (current) use of oral hypoglycemic drugs: Secondary | ICD-10-CM | POA: Insufficient documentation

## 2015-06-15 DIAGNOSIS — F17210 Nicotine dependence, cigarettes, uncomplicated: Secondary | ICD-10-CM | POA: Insufficient documentation

## 2015-06-15 DIAGNOSIS — I10 Essential (primary) hypertension: Secondary | ICD-10-CM | POA: Insufficient documentation

## 2015-06-15 DIAGNOSIS — F329 Major depressive disorder, single episode, unspecified: Secondary | ICD-10-CM | POA: Insufficient documentation

## 2015-06-15 DIAGNOSIS — E7800 Pure hypercholesterolemia, unspecified: Secondary | ICD-10-CM | POA: Insufficient documentation

## 2015-06-15 DIAGNOSIS — Z951 Presence of aortocoronary bypass graft: Secondary | ICD-10-CM | POA: Insufficient documentation

## 2015-06-15 DIAGNOSIS — I2510 Atherosclerotic heart disease of native coronary artery without angina pectoris: Secondary | ICD-10-CM | POA: Insufficient documentation

## 2015-06-15 LAB — I-STAT TROPONIN, ED: Troponin i, poc: 0 ng/mL (ref 0.00–0.08)

## 2015-06-15 LAB — BASIC METABOLIC PANEL
ANION GAP: 9 (ref 5–15)
BUN: 15 mg/dL (ref 6–20)
CHLORIDE: 107 mmol/L (ref 101–111)
CO2: 24 mmol/L (ref 22–32)
Calcium: 9.2 mg/dL (ref 8.9–10.3)
Creatinine, Ser: 0.84 mg/dL (ref 0.44–1.00)
GFR calc non Af Amer: >60 mL/min (ref >60)
Glucose, Bld: 101 mg/dL — ABNORMAL HIGH (ref 65–99)
POTASSIUM: 3.9 mmol/L (ref 3.5–5.1)
SODIUM: 140 mmol/L (ref 135–145)

## 2015-06-15 LAB — HEPATIC FUNCTION PANEL
ALT: 12 U/L — AB (ref 14–54)
AST: 18 U/L (ref 15–41)
Albumin: 3.3 g/dL — ABNORMAL LOW (ref 3.5–5.0)
Alkaline Phosphatase: 71 U/L (ref 38–126)
Bilirubin, Direct: <0.1 mg/dL — ABNORMAL LOW (ref 0.1–0.5)
TOTAL PROTEIN: 7.2 g/dL (ref 6.5–8.1)
Total Bilirubin: 0.3 mg/dL (ref 0.3–1.2)

## 2015-06-15 LAB — CBC
HEMATOCRIT: 26.4 % — AB (ref 36.0–46.0)
HEMOGLOBIN: 8.3 g/dL — AB (ref 12.0–15.0)
MCH: 23.1 pg — ABNORMAL LOW (ref 26.0–34.0)
MCHC: 31.4 g/dL (ref 30.0–36.0)
MCV: 73.3 fL — AB (ref 78.0–100.0)
PLATELETS: 360 10*3/uL (ref 150–400)
RBC: 3.6 MIL/uL — AB (ref 3.87–5.11)
RDW: 16.7 % — ABNORMAL HIGH (ref 11.5–15.5)
WBC: 7.3 10*3/uL (ref 4.0–10.5)

## 2015-06-15 LAB — BRAIN NATRIURETIC PEPTIDE: B NATRIURETIC PEPTIDE 5: 155 pg/mL — AB (ref 0.0–100.0)

## 2015-06-15 MED ORDER — FUROSEMIDE 20 MG PO TABS: 20.0000 mg | ORAL_TABLET | Freq: Every day | ORAL | Status: DC

## 2015-06-15 NOTE — ED Provider Notes (Signed)
CSN: [id number]     Arrival date & time 06/15/15  1056 History   First MD Initiated Contact with Patient 06/15/15 1135     Chief Complaint  Patient presents with  . Chest Pain  . Shortness of Breath     (Consider location/radiation/quality/duration/timing/severity/associated sxs/prior Treatment) Patient is a 60 y.o. female presenting with chest pain and shortness of breath. The history is provided by the patient (The patient complains of some chest discomfort and shortness of breath when she lies down along with some swelling in her ankles.).  Chest Pain Pain location:  Epigastric Pain quality: aching   Pain radiates to:  Does not radiate Pain radiates to the back: no   Pain severity:  Mild Onset quality:  Gradual Timing:  Constant Progression:  Waxing and waning Chronicity:  New Context: not breathing   Associated symptoms: shortness of breath   Associated symptoms: no abdominal pain, no back pain, no cough, no fatigue and no headache   Shortness of Breath Associated symptoms: chest pain   Associated symptoms: no abdominal pain, no cough, no headaches and no rash     Past Medical History  Diagnosis Date  . CAD in native artery 02/2004    Mid LAD lesoin just after Large D1 --> CABG x 1 LIMA-LAD  . 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 competitive flow  . Hypertension   . High cholesterol   . Type II diabetes mellitus (HCC)   . Anemia   . History of blood transfusion 2005    "related to [facility]"   . Allergy   . CAP (community acquired pneumonia) 08/07/2014  . Chronic bronchitis (HCC)     "I think I get it q yr" (08/07/2014)  . Depression    Past Surgical History  Procedure Laterality Date  . Cesarean section  1977; 1989  . Tubal ligation  1989  . Cholecystectomy  ~ 2012  . Cardiac catheterization  2010    Previous LAD 95% lesion - now ~30-40%; patent LIMA with competitive flow  . Coronary artery bypass graft  02/2004    "CABG X1" (03/22/2013); LIMA-LAD   Family History  Problem Relation Age of Onset  . Heart disease Mother   . Diabetes Mother   . Cancer Mother     cervical  . Diabetes Father   . Lupus Sister   . Heart failure Sister   . Hepatitis C Sister   . Heart failure Brother   . Diabetes Brother   . Hypertension Daughter    Social History  Substance Use Topics  . Smoking status: Current Every Day Smoker -- 0.50 packs/day for 42 years    Types: Cigarettes  . Smokeless tobacco: Never Used  . Alcohol Use: 0.0 oz/week    0 Standard drinks or equivalent per week     Comment: 08/07/2014 "glass of wine maybe 2 times/yr"   OB History    No data available     Review of Systems  Constitutional: Negative for appetite change and fatigue.  HENT: Negative for congestion, ear discharge and sinus pressure.   Eyes: Negative for discharge.  Respiratory: Positive for shortness of breath. Negative for cough.   Cardiovascular: Positive for chest pain.  Gastrointestinal: Negative for abdominal pain and diarrhea.  Genitourinary: Negative for frequency and hematuria.  Musculoskeletal: Negative for back pain.       Ankle swelling  Skin: Negative for rash.  Neurological: Negative for  seizures and headaches.  Psychiatric/Behavioral: Negative for hallucinations.      Allergies  Ciprofloxacin  Home Medications   Prior to Admission medications   Medication Sig Start Date End Date Taking? Authorizing Provider  aspirin 325 MG tablet Take 325 mg by mouth daily.   Yes Historical Provider, MD  atorvastatin (LIPITOR) 10 MG tablet Take 1 tablet (10 mg total) by mouth daily. 07/08/14  Yes Tresa Garter, MD  Blood Glucose Monitoring Suppl (TRUE METRIX METER) W/DEVICE KIT Use as directed 04/10/15  Yes Tresa Garter, MD  cholecalciferol (VITAMIN D) 1000 units tablet Take 1,000 Units by mouth daily.   Yes Historical Provider, MD  gabapentin (NEURONTIN) 300 MG capsule Take 1 capsule (300 mg total) by  mouth 3 (three) times daily. 02/27/15  Yes Tresa Garter, MD  glucose blood (TRUE METRIX BLOOD GLUCOSE TEST) test strip Use as instructed 04/10/15  Yes Olugbemiga E Doreene Burke, MD  hydrochlorothiazide (HYDRODIURIL) 12.5 MG tablet Take 1 tablet (12.5 mg total) by mouth daily. 07/08/14  Yes Tresa Garter, MD  Insulin Glargine (TOUJEO SOLOSTAR) 300 UNIT/ML SOPN Inject 25 Units into the skin daily with breakfast. 05/07/15  Yes Olugbemiga E Doreene Burke, MD  lisinopril (PRINIVIL,ZESTRIL) 10 MG tablet Take 1 tablet (10 mg total) by mouth daily. 07/08/14  Yes Tresa Garter, MD  metFORMIN (GLUCOPHAGE-XR) 500 MG 24 hr tablet TAKE 2 TABLETS BY MOUTH 2 TIMES DAILY WITH A MEAL 05/26/15  Yes Tresa Garter, MD  metoprolol (LOPRESSOR) 50 MG tablet Take 1 tablet (50 mg total) by mouth 2 (two) times daily. 07/08/14  Yes Tresa Garter, MD  Multiple Vitamin (MULTIVITAMIN WITH MINERALS) TABS tablet Take 1 tablet by mouth daily.   Yes Historical Provider, MD  nitroGLYCERIN (NITROSTAT) 0.4 MG SL tablet Place 1 tablet (0.4 mg total) under the tongue every 5 (five) minutes as needed for chest pain. 03/23/13  Yes Ripudeep Krystal Eaton, MD  pantoprazole (PROTONIX) 40 MG tablet Take 1 tablet (40 mg total) by mouth daily. 03/12/15  Yes Tresa Garter, MD  TRUEPLUS LANCETS 28G MISC Use as directed 04/10/15  Yes Tresa Garter, MD  buPROPion (WELLBUTRIN) 100 MG tablet Take 1 tablet (100 mg total) by mouth 2 (two) times daily. Patient not taking: Reported on 06/15/2015 07/08/14   Tresa Garter, MD  furosemide (LASIX) 20 MG tablet Take 1 tablet (20 mg total) by mouth daily. 06/15/15   Milton Ferguson, MD   BP 144/55 mmHg  Pulse 76  Temp(Src) 98.8 F (37.1 C) (Oral)  Resp 16  Ht '5\' 7"'$  (1.702 m)  Wt 230 lb (104.327 kg)  BMI 36.01 kg/m2  SpO2 99% Physical Exam  Constitutional: She is oriented to person, place, and time. She appears well-developed.  HENT:  Head: Normocephalic.  Eyes: Conjunctivae and EOM  are normal. No scleral icterus.  Neck: Neck supple. No thyromegaly present.  Cardiovascular: Normal rate and regular rhythm.  Exam reveals no gallop and no friction rub.   No murmur heard. Pulmonary/Chest: No stridor. She has no wheezes. She has no rales. She exhibits no tenderness.  Abdominal: She exhibits no distension. There is no tenderness. There is no rebound.  Musculoskeletal: Normal range of motion. She exhibits edema.  1+ edema in ankles  Lymphadenopathy:    She has no cervical adenopathy.  Neurological: She is oriented to person, place, and time. She exhibits normal muscle tone. Coordination normal.  Skin: No rash noted. No erythema.  Psychiatric: She has a normal mood  and affect. Her behavior is normal.    ED Course  Procedures (including critical care time) Labs Review Labs Reviewed  BASIC METABOLIC PANEL - Abnormal; Notable for the following:    Glucose, Bld 101 (*)    All other components within normal limits  CBC - Abnormal; Notable for the following:    RBC 3.60 (*)    Hemoglobin 8.3 (*)    HCT 26.4 (*)    MCV 73.3 (*)    MCH 23.1 (*)    RDW 16.7 (*)    All other components within normal limits  BRAIN NATRIURETIC PEPTIDE - Abnormal; Notable for the following:    B Natriuretic Peptide 155.0 (*)    All other components within normal limits  HEPATIC FUNCTION PANEL - Abnormal; Notable for the following:    Albumin 3.3 (*)    ALT 12 (*)    Bilirubin, Direct <0.1 (*)    All other components within normal limits  I-STAT TROPOININ, ED    Imaging Review Dg Chest 2 View  06/15/2015  CLINICAL DATA:  60 year old female with acute left chest pain, cough, and shortness of breath for 1 week. EXAM: CHEST  2 VIEW COMPARISON:  08/07/2014 and prior radiographs FINDINGS: Cardiomegaly and CABG changes again noted. There is no evidence of focal airspace disease, pulmonary edema, suspicious pulmonary nodule/mass, pleural effusion, or pneumothorax. No acute bony abnormalities are  identified. IMPRESSION: Cardiomegaly without evidence of acute cardiopulmonary disease. Electronically Signed   By: Margarette Canada M.D.   On: 06/15/2015 12:24   I have personally reviewed and evaluated these images and lab results as part of my medical decision-making.   EKG Interpretation   Date/Time:  Sunday June 15 2015 11:03:54 EST Ventricular Rate:  83 PR Interval:  156 QRS Duration: 74 QT Interval:  370 QTC Calculation: 434 R Axis:   47 Text Interpretation:  Normal sinus rhythm Normal ECG Confirmed by Rehana Uncapher   MD, [place] Wherry [phone]) on 06/15/2015 2:19:46 PM      MDM   Final diagnoses:  Chest pain of unknown etiology    Patient with epigastric discomfort and some dyspnea when laying down along with edema in ankles. Labs including including troponin unremarkable. Except slightly elevated BNP. Chest x-ray unremarkable. Patient will increase her Protonix to twice a day she'll be put on Lasix 20 mg once a day and she will follow-up with her PCP this week    Milton Ferguson, MD 06/15/15 1429

## 2015-06-15 NOTE — ED Notes (Signed)
Pt from home with c/o left chest pain radiating to left shoulder with SOB x 1 week worsening over night.  Pt in NAD, A&O.

## 2015-06-15 NOTE — Discharge Instructions (Signed)
Increase her Protonix to twice a day. Follow-up with your family doctor later this week for recheck

## 2015-06-15 NOTE — ED Notes (Addendum)
Called Lab ref BNP

## 2015-06-16 MED FILL — PANTOPRAZOLE SOD DR 40 MG T: 40 | 90 days supply | Qty: 90 | Fill #6

## 2015-06-16 MED FILL — HYDROCHLOROTHIAZIDE 12.5 MG: 12.5 | 90 days supply | Qty: 90 | Fill #6

## 2015-06-16 MED FILL — GABAPENTIN 300 MG CAPSULE: 300 | 90 days supply | Qty: 270 | Fill #1

## 2015-06-16 MED FILL — LISINOPRIL 10 MG TABLET: 10 | 90 days supply | Qty: 90 | Fill #6

## 2015-06-16 MED FILL — ATORVASTATIN 10 MG TABLET: 10 | 90 days supply | Qty: 90 | Fill #6

## 2015-06-20 MED FILL — $TOUJEO SOLOSTAR 300 UNIT/M: 300 | 54 days supply | Qty: 3 | Fill #0

## 2015-06-26 ENCOUNTER — Ambulatory Visit: Payer: No Typology Code available for payment source | Attending: Internal Medicine | Admitting: Internal Medicine

## 2015-06-26 ENCOUNTER — Encounter: Payer: Self-pay | Admitting: Internal Medicine

## 2015-06-26 VITALS — BP 149/73 | HR 81 | Temp 98.4°F | Resp 20 | Ht 66.0 in | Wt 243.0 lb

## 2015-06-26 DIAGNOSIS — Z794 Long term (current) use of insulin: Secondary | ICD-10-CM

## 2015-06-26 DIAGNOSIS — Z951 Presence of aortocoronary bypass graft: Secondary | ICD-10-CM | POA: Insufficient documentation

## 2015-06-26 DIAGNOSIS — J42 Unspecified chronic bronchitis: Secondary | ICD-10-CM | POA: Insufficient documentation

## 2015-06-26 DIAGNOSIS — R0602 Shortness of breath: Secondary | ICD-10-CM

## 2015-06-26 DIAGNOSIS — D649 Anemia, unspecified: Secondary | ICD-10-CM | POA: Insufficient documentation

## 2015-06-26 DIAGNOSIS — Z1211 Encounter for screening for malignant neoplasm of colon: Secondary | ICD-10-CM

## 2015-06-26 DIAGNOSIS — F17210 Nicotine dependence, cigarettes, uncomplicated: Secondary | ICD-10-CM | POA: Insufficient documentation

## 2015-06-26 DIAGNOSIS — K219 Gastro-esophageal reflux disease without esophagitis: Secondary | ICD-10-CM | POA: Insufficient documentation

## 2015-06-26 DIAGNOSIS — I2510 Atherosclerotic heart disease of native coronary artery without angina pectoris: Secondary | ICD-10-CM | POA: Insufficient documentation

## 2015-06-26 DIAGNOSIS — Z8701 Personal history of pneumonia (recurrent): Secondary | ICD-10-CM | POA: Insufficient documentation

## 2015-06-26 DIAGNOSIS — E119 Type 2 diabetes mellitus without complications: Secondary | ICD-10-CM

## 2015-06-26 DIAGNOSIS — I10 Essential (primary) hypertension: Secondary | ICD-10-CM

## 2015-06-26 DIAGNOSIS — F329 Major depressive disorder, single episode, unspecified: Secondary | ICD-10-CM | POA: Insufficient documentation

## 2015-06-26 DIAGNOSIS — Z881 Allergy status to other antibiotic agents status: Secondary | ICD-10-CM | POA: Insufficient documentation

## 2015-06-26 DIAGNOSIS — Z7984 Long term (current) use of oral hypoglycemic drugs: Secondary | ICD-10-CM | POA: Insufficient documentation

## 2015-06-26 DIAGNOSIS — F17200 Nicotine dependence, unspecified, uncomplicated: Secondary | ICD-10-CM

## 2015-06-26 DIAGNOSIS — Z79899 Other long term (current) drug therapy: Secondary | ICD-10-CM | POA: Insufficient documentation

## 2015-06-26 DIAGNOSIS — J029 Acute pharyngitis, unspecified: Secondary | ICD-10-CM | POA: Insufficient documentation

## 2015-06-26 DIAGNOSIS — E1140 Type 2 diabetes mellitus with diabetic neuropathy, unspecified: Secondary | ICD-10-CM | POA: Insufficient documentation

## 2015-06-26 DIAGNOSIS — Z7982 Long term (current) use of aspirin: Secondary | ICD-10-CM | POA: Insufficient documentation

## 2015-06-26 DIAGNOSIS — E785 Hyperlipidemia, unspecified: Secondary | ICD-10-CM

## 2015-06-26 DIAGNOSIS — R0609 Other forms of dyspnea: Secondary | ICD-10-CM | POA: Insufficient documentation

## 2015-06-26 DIAGNOSIS — M7989 Other specified soft tissue disorders: Secondary | ICD-10-CM | POA: Insufficient documentation

## 2015-06-26 LAB — GLUCOSE, POCT (MANUAL RESULT ENTRY): POC GLUCOSE: 150 mg/dL — AB (ref 70–99)

## 2015-06-26 LAB — POCT GLYCOSYLATED HEMOGLOBIN (HGB A1C): HEMOGLOBIN A1C: 8.2

## 2015-06-26 MED ORDER — NITROGLYCERIN 0.4 MG SL SUBL: 0.4000 mg | SUBLINGUAL_TABLET | SUBLINGUAL | Status: DC | PRN

## 2015-06-26 MED ORDER — FUROSEMIDE 20 MG PO TABS: 20.0000 mg | ORAL_TABLET | Freq: Every day | ORAL | Status: DC

## 2015-06-26 MED FILL — FUROSEMIDE 20 MG TABLET: 20 | 30 days supply | Qty: 30 | Fill #0

## 2015-06-26 NOTE — Patient Instructions (Signed)
Diabetes and Exercise Exercising regularly is important. It is not just about losing weight. It has many health benefits, such as:  Improving your overall fitness, flexibility, and endurance.  Increasing your bone density.  Helping with weight control.  Decreasing your body fat.  Increasing your muscle strength.  Reducing stress and tension.  Improving your overall health. People with diabetes who exercise gain additional benefits because exercise:  Reduces appetite.  Improves the body's use of blood sugar (glucose).  Helps lower or control blood glucose.  Decreases blood pressure.  Helps control blood lipids (such as cholesterol and triglycerides).  Improves the body's use of the hormone insulin by:  Increasing the body's insulin sensitivity.  Reducing the body's insulin needs.  Decreases the risk for heart disease because exercising:  Lowers cholesterol and triglycerides levels.  Increases the levels of good cholesterol (such as high-density lipoproteins [HDL]) in the body.  Lowers blood glucose levels. YOUR ACTIVITY PLAN  Choose an activity that you enjoy, and set realistic goals. To exercise safely, you should begin practicing any new physical activity slowly, and gradually increase the intensity of the exercise over time. Your health care provider or diabetes educator can help create an activity plan that works for you. General recommendations include:  Encouraging children to engage in at least 60 minutes of physical activity each day.  Stretching and performing strength training exercises, such as yoga or weight lifting, at least 2 times per week.  Performing a total of at least 150 minutes of moderate-intensity exercise each week, such as brisk walking or water aerobics.  Exercising at least 3 days per week, making sure you allow no more than 2 consecutive days to pass without exercising.  Avoiding long periods of inactivity (90 minutes or more). When you  have to spend an extended period of time sitting down, take frequent breaks to walk or stretch. RECOMMENDATIONS FOR EXERCISING WITH TYPE 1 OR TYPE 2 DIABETES   Check your blood glucose before exercising. If blood glucose levels are greater than 240 mg/dL, check for urine ketones. Do not exercise if ketones are present.  Avoid injecting insulin into areas of the body that are going to be exercised. For example, avoid injecting insulin into:  The arms when playing tennis.  The legs when jogging.  Keep a record of:  Food intake before and after you exercise.  Expected peak times of insulin action.  Blood glucose levels before and after you exercise.  The type and amount of exercise you have done.  Review your records with your health care provider. Your health care provider will help you to develop guidelines for adjusting food intake and insulin amounts before and after exercising.  If you take insulin or oral hypoglycemic agents, watch for signs and symptoms of hypoglycemia. They include:  Dizziness.  Shaking.  Sweating.  Chills.  Confusion.  Drink plenty of water while you exercise to prevent dehydration or heat stroke. Body water is lost during exercise and must be replaced.  Talk to your health care provider before starting an exercise program to make sure it is safe for you. Remember, almost any type of activity is better than none.   This information is not intended to replace advice given to you by your health care provider. Make sure you discuss any questions you have with your health care provider.   Document Released: 06/26/2003 Document Revised: 08/20/2014 Document Reviewed: 09/12/2012 Elsevier Interactive Patient Education 2016 [employer] Basic Carbohydrate Counting for Diabetes Mellitus Carbohydrate counting   is a method for keeping track of the amount of carbohydrates you eat. Eating carbohydrates naturally increases the level of sugar (glucose) in your  blood, so it is important for you to know the amount that is okay for you to have in every meal. Carbohydrate counting helps keep the level of glucose in your blood within normal limits. The amount of carbohydrates allowed is different for every person. A dietitian can help you calculate the amount that is right for you. Once you know the amount of carbohydrates you can have, you can count the carbohydrates in the foods you want to eat. Carbohydrates are found in the following foods:  Grains, such as breads and cereals.  Dried beans and soy products.  Starchy vegetables, such as potatoes, peas, and corn.  Fruit and fruit juices.  Milk and yogurt.  Sweets and snack foods, such as cake, cookies, candy, chips, soft drinks, and fruit drinks. CARBOHYDRATE COUNTING There are two ways to count the carbohydrates in your food. You can use either of the methods or a combination of both. Reading the "Nutrition Facts" on Packaged Food The "Nutrition Facts" is an area that is included on the labels of almost all packaged food and beverages in the [place]. It includes the serving size of that food or beverage and information about the nutrients in each serving of the food, including the grams (g) of carbohydrate per serving.  Decide the number of servings of this food or beverage that you will be able to eat or drink. Multiply that number of servings by the number of grams of carbohydrate that is listed on the label for that serving. The total will be the amount of carbohydrates you will be having when you eat or drink this food or beverage. Learning Standard Serving Sizes of Food When you eat food that is not packaged or does not include "Nutrition Facts" on the label, you need to measure the servings in order to count the amount of carbohydrates.A serving of most carbohydrate-rich foods contains about 15 g of carbohydrates. The following list includes serving sizes of carbohydrate-rich foods that  provide 15 g ofcarbohydrate per serving:   1 slice of bread (1 oz) or 1 six-inch tortilla.    of a hamburger bun or English muffin.  4-6 crackers.   cup unsweetened dry cereal.    cup hot cereal.   cup rice or pasta.    cup mashed potatoes or  of a large baked potato.  1 cup fresh fruit or one small piece of fruit.    cup canned or frozen fruit or fruit juice.  1 cup milk.   cup plain fat-free yogurt or yogurt sweetened with artificial sweeteners.   cup cooked dried beans or starchy vegetable, such as peas, corn, or potatoes.  Decide the number of standard-size servings that you will eat. Multiply that number of servings by 15 (the grams of carbohydrates in that serving). For example, if you eat 2 cups of strawberries, you will have eaten 2 servings and 30 g of carbohydrates (2 servings x 15 g = 30 g). For foods such as soups and casseroles, in which more than one food is mixed in, you will need to count the carbohydrates in each food that is included. EXAMPLE OF CARBOHYDRATE COUNTING Sample Dinner  3 oz chicken breast.   cup of brown rice.   cup of corn.  1 cup milk.   1 cup strawberries with sugar-free whipped topping.  Carbohydrate Calculation Step   1: Identify the foods that contain carbohydrates:   Rice.   Corn.   Milk.   Strawberries. Step 2:Calculate the number of servings eaten of each:   2 servings of rice.   1 serving of corn.   1 serving of milk.   1 serving of strawberries. Step 3: Multiply each of those number of servings by 15 g:   2 servings of rice x 15 g = 30 g.   1 serving of corn x 15 g = 15 g.   1 serving of milk x 15 g = 15 g.   1 serving of strawberries x 15 g = 15 g. Step 4: Add together all of the amounts to find the total grams of carbohydrates eaten: 30 g + 15 g + 15 g + 15 g = 75 g.   This information is not intended to replace advice given to you by your health care provider. Make sure you  discuss any questions you have with your health care provider.   Document Released: 04/05/2005 Document Revised: 04/26/2014 Document Reviewed: 03/02/2013 Elsevier Interactive Patient Education 2016 [employer]. Smoking Cessation, Tips for Success If you are ready to quit smoking, congratulations! You have chosen to help yourself be healthier. Cigarettes bring nicotine, tar, carbon monoxide, and other irritants into your body. Your lungs, heart, and blood vessels will be able to work better without these poisons. There are many different ways to quit smoking. Nicotine gum, nicotine patches, a nicotine inhaler, or nicotine nasal spray can help with physical craving. Hypnosis, support groups, and medicines help break the habit of smoking. WHAT THINGS CAN I DO TO MAKE QUITTING EASIER?  Here are some tips to help you quit for good:  Pick a date when you will quit smoking completely. Tell all of your friends and family about your plan to quit on that date.  Do not try to slowly cut down on the number of cigarettes you are smoking. Pick a quit date and quit smoking completely starting on that day.  Throw away all cigarettes.   Clean and remove all ashtrays from your home, work, and car.  On a card, write down your reasons for quitting. Carry the card with you and read it when you get the urge to smoke.  Cleanse your body of nicotine. Drink enough water and fluids to keep your urine clear or pale yellow. Do this after quitting to flush the nicotine from your body.  Learn to predict your moods. Do not let a bad situation be your excuse to have a cigarette. Some situations in your life might tempt you into wanting a cigarette.  Never have "just one" cigarette. It leads to wanting another and another. Remind yourself of your decision to quit.  Change habits associated with smoking. If you smoked while driving or when feeling stressed, try other activities to replace smoking. Stand up when drinking  your coffee. Brush your teeth after eating. Sit in a different chair when you read the paper. Avoid alcohol while trying to quit, and try to drink fewer caffeinated beverages. Alcohol and caffeine may urge you to smoke.  Avoid foods and drinks that can trigger a desire to smoke, such as sugary or spicy foods and alcohol.  Ask people who smoke not to smoke around you.  Have something planned to do right after eating or having a cup of coffee. For example, plan to take a walk or exercise.  Try a relaxation exercise to calm you down and  decrease your stress. Remember, you may be tense and nervous for the first 2 weeks after you quit, but this will pass.  Find new activities to keep your hands busy. Play with a pen, coin, or rubber band. Doodle or draw things on paper.  Brush your teeth right after eating. This will help cut down on the craving for the taste of tobacco after meals. You can also try mouthwash.   Use oral substitutes in place of cigarettes. Try using lemon drops, carrots, cinnamon sticks, or chewing gum. Keep them handy so they are available when you have the urge to smoke.  When you have the urge to smoke, try deep breathing.  Designate your home as a nonsmoking area.  If you are a heavy smoker, ask your health care provider about a prescription for nicotine chewing gum. It can ease your withdrawal from nicotine.  Reward yourself. Set aside the cigarette money you save and buy yourself something nice.  Look for support from others. Join a support group or smoking cessation program. Ask someone at home or at work to help you with your plan to quit smoking.  Always ask yourself, "Do I need this cigarette or is this just a reflex?" Tell yourself, "Today, I choose not to smoke," or "I do not want to smoke." You are reminding yourself of your decision to quit.  Do not replace cigarette smoking with electronic cigarettes (commonly called e-cigarettes). The safety of e-cigarettes is  unknown, and some may contain harmful chemicals.  If you relapse, do not give up! Plan ahead and think about what you will do the next time you get the urge to smoke. HOW WILL I FEEL WHEN I QUIT SMOKING? You may have symptoms of withdrawal because your body is used to nicotine (the addictive substance in cigarettes). You may crave cigarettes, be irritable, feel very hungry, cough often, get headaches, or have difficulty concentrating. The withdrawal symptoms are only temporary. They are strongest when you first quit but will go away within 10-14 days. When withdrawal symptoms occur, stay in control. Think about your reasons for quitting. Remind yourself that these are signs that your body is healing and getting used to being without cigarettes. Remember that withdrawal symptoms are easier to treat than the major diseases that smoking can cause.  Even after the withdrawal is over, expect periodic urges to smoke. However, these cravings are generally short lived and will go away whether you smoke or not. Do not smoke! WHAT RESOURCES ARE AVAILABLE TO HELP ME QUIT SMOKING? Your health care provider can direct you to community resources or hospitals for support, which may include:  Group support.  Education.  Hypnosis.  Therapy.   This information is not intended to replace advice given to you by your health care provider. Make sure you discuss any questions you have with your health care provider.   Document Released: 01/02/2004 Document Revised: 04/26/2014 Document Reviewed: 09/21/2012 Elsevier Interactive Patient Education 2016 [employer]. Hypertension Hypertension, commonly called high blood pressure, is when the force of blood pumping through your arteries is too strong. Your arteries are the blood vessels that carry blood from your heart throughout your body. A blood pressure reading consists of a higher number over a lower number, such as 110/72. The higher number (systolic) is the  pressure inside your arteries when your heart pumps. The lower number (diastolic) is the pressure inside your arteries when your heart relaxes. Ideally you want your blood pressure below 120/80. Hypertension forces  your heart to work harder to pump blood. Your arteries may become narrow or stiff. Having untreated or uncontrolled hypertension can cause heart attack, stroke, kidney disease, and other problems. RISK FACTORS Some risk factors for high blood pressure are controllable. Others are not.  Risk factors you cannot control include:   Race. You may be at higher risk if you are African American.  Age. Risk increases with age.  Gender. Men are at higher risk than women before age 23 years. After age 33, women are at higher risk than men. Risk factors you can control include:  Not getting enough exercise or physical activity.  Being overweight.  Getting too much fat, sugar, calories, or salt in your diet.  Drinking too much alcohol. SIGNS AND SYMPTOMS Hypertension does not usually cause signs or symptoms. Extremely high blood pressure (hypertensive crisis) may cause headache, anxiety, shortness of breath, and nosebleed. DIAGNOSIS To check if you have hypertension, your health care provider will measure your blood pressure while you are seated, with your arm held at the level of your heart. It should be measured at least twice using the same arm. Certain conditions can cause a difference in blood pressure between your right and left arms. A blood pressure reading that is higher than normal on one occasion does not mean that you need treatment. If it is not clear whether you have high blood pressure, you may be asked to return on a different day to have your blood pressure checked again. Or, you may be asked to monitor your blood pressure at home for 1 or more weeks. TREATMENT Treating high blood pressure includes making lifestyle changes and possibly taking medicine. Living a healthy  lifestyle can help lower high blood pressure. You may need to change some of your habits. Lifestyle changes may include:  Following the DASH diet. This diet is high in fruits, vegetables, and whole grains. It is low in salt, red meat, and added sugars.  Keep your sodium intake below 2,300 mg per day.  Getting at least 30-45 minutes of aerobic exercise at least 4 times per week.  Losing weight if necessary.  Not smoking.  Limiting alcoholic beverages.  Learning ways to reduce stress. Your health care provider may prescribe medicine if lifestyle changes are not enough to get your blood pressure under control, and if one of the following is true:  You are 65-28 years of age and your systolic blood pressure is above 140.  You are 39 years of age or older, and your systolic blood pressure is above 150.  Your diastolic blood pressure is above 90.  You have diabetes, and your systolic blood pressure is over [id number] or your diastolic blood pressure is over 90.  You have kidney disease and your blood pressure is above 140/90.  You have heart disease and your blood pressure is above 140/90. Your personal target blood pressure may vary depending on your medical conditions, your age, and other factors. HOME CARE INSTRUCTIONS  Have your blood pressure rechecked as directed by your health care provider.   Take medicines only as directed by your health care provider. Follow the directions carefully. Blood pressure medicines must be taken as prescribed. The medicine does not work as well when you skip doses. Skipping doses also puts you at risk for problems.  Do not smoke.   Monitor your blood pressure at home as directed by your health care provider. SEEK MEDICAL CARE IF:   You think you are having a reaction  to medicines taken.  You have recurrent headaches or feel dizzy.  You have swelling in your ankles.  You have trouble with your vision. SEEK IMMEDIATE MEDICAL CARE IF:  You  develop a severe headache or confusion.  You have unusual weakness, numbness, or feel faint.  You have severe chest or abdominal pain.  You vomit repeatedly.  You have trouble breathing. MAKE SURE YOU:   Understand these instructions.  Will watch your condition.  Will get help right away if you are not doing well or get worse.   This information is not intended to replace advice given to you by your health care provider. Make sure you discuss any questions you have with your health care provider.   Document Released: 04/05/2005 Document Revised: 08/20/2014 Document Reviewed: 01/26/2013 Elsevier Interactive Patient Education [employer].

## 2015-06-26 NOTE — Progress Notes (Signed)
Patient is here for ED FU for chest discomfort  Patient complains of discomfort in the chest feeling like not being able to burp.

## 2015-06-26 NOTE — Progress Notes (Signed)
Deborah Coffey, is a 60 y.o. female  CNO:[id number]  QHU:[id number]  DOB - 1955/08/23  Chief Complaint  Patient presents with  . Follow-up    Chest Discomfort        Subjective:   Deborah Coffey is a 60 y.o. female here today for a follow up visit and medication refills. Patient has history of CAD s/p CABG (2005), Hypertension, dyslipidemia, type 2 Diabetes Mellitus, diabetic neuropathy and GERD. Patient had recent visit (06/15/15) to ED for chest pain, extensive workup negative for cardiac etiology. GERD suspected, protonix 40 mg increased to BID, lasix 20 mg added.  Patient has been having swelling in heart and legs and ongoing shortness of breath but no arthritis cough. She denies paroxysmal nocturnal dyspnea , she denies orthopnea. Patient states medication compliance with exception of lasix. Patient states she continues to have pressure not pain in epigastric region which has lessened since increasing protonix. Patient A1C down from 10.1 (02/27/15) to 8.2 today.  Patient congratulated on great progress with blood sugar control and encouraged to continue current medication regime. Patient referred for colonoscopy and pap smear and mammogram to be scheduled at next visit. Patient wishes to quit smoking now, currently smoking 6 cigarettes per day down from 1/2 a pack a day. Patient has No headache, No chest pain, No abdominal pain - No Nausea, No new weakness tingling or numbness.  Problem  Sob (Shortness of Breath)  Colon Cancer Screening    ALLERGIES: Allergies  Allergen Reactions  . Ciprofloxacin Rash    PAST MEDICAL HISTORY: Past Medical History  Diagnosis Date  . CAD in native artery 02/2004    Mid LAD lesoin just after Large D1 --> CABG x 1 LIMA-LAD  . 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 competitive flow  . Hypertension   . High cholesterol   . Type II diabetes mellitus ([place])   . Anemia   . History of blood  transfusion 2005    "related to [facility]"   . Allergy   . CAP (community acquired pneumonia) 08/07/2014  . Chronic bronchitis ([place])     "I think I get it q yr" (08/07/2014)  . Depression     MEDICATIONS AT HOME: Prior to Admission medications   Medication Sig Start Date End Date Taking? Authorizing Provider  aspirin 325 MG tablet Take 325 mg by mouth daily.   Yes Historical Provider, MD  atorvastatin (LIPITOR) 10 MG tablet Take 1 tablet (10 mg total) by mouth daily. 07/08/14  Yes Tresa Garter, MD  Blood Glucose Monitoring Suppl (TRUE METRIX METER) W/DEVICE KIT Use as directed 04/10/15  Yes Tresa Garter, MD  cholecalciferol (VITAMIN D) 1000 units tablet Take 1,000 Units by mouth daily.   Yes Historical Provider, MD  furosemide (LASIX) 20 MG tablet Take 1 tablet (20 mg total) by mouth daily. 06/26/15  Yes Tresa Garter, MD  gabapentin (NEURONTIN) 300 MG capsule Take 1 capsule (300 mg total) by mouth 3 (three) times daily. 02/27/15  Yes Tresa Garter, MD  glucose blood (TRUE METRIX BLOOD GLUCOSE TEST) test strip Use as instructed 04/10/15  Yes Deyona Soza E Doreene Burke, MD  hydrochlorothiazide (HYDRODIURIL) 12.5 MG tablet Take 1 tablet (12.5 mg total) by mouth daily. 07/08/14  Yes Tresa Garter, MD  Insulin Glargine (TOUJEO SOLOSTAR) 300 UNIT/ML SOPN Inject 25 Units into the skin daily with breakfast. 05/07/15  Yes Tresa Garter, MD  lisinopril (PRINIVIL,ZESTRIL) 10  MG tablet Take 1 tablet (10 mg total) by mouth daily. 07/08/14  Yes Tresa Garter, MD  metFORMIN (GLUCOPHAGE-XR) 500 MG 24 hr tablet TAKE 2 TABLETS BY MOUTH 2 TIMES DAILY WITH A MEAL 05/26/15  Yes Tresa Garter, MD  metoprolol (LOPRESSOR) 50 MG tablet Take 1 tablet (50 mg total) by mouth 2 (two) times daily. 07/08/14  Yes Tresa Garter, MD  Multiple Vitamin (MULTIVITAMIN WITH MINERALS) TABS tablet Take 1 tablet by mouth daily.   Yes Historical Provider, MD  nitroGLYCERIN (NITROSTAT) 0.4 MG SL tablet  Place 1 tablet (0.4 mg total) under the tongue every 5 (five) minutes as needed for chest pain. 06/26/15  Yes Tresa Garter, MD  pantoprazole (PROTONIX) 40 MG tablet Take 1 tablet (40 mg total) by mouth daily. 03/12/15  Yes Tresa Garter, MD  TRUEPLUS LANCETS 28G MISC Use as directed 04/10/15  Yes Tresa Garter, MD  buPROPion (WELLBUTRIN) 100 MG tablet Take 1 tablet (100 mg total) by mouth 2 (two) times daily. Patient not taking: Reported on 06/15/2015 07/08/14   Tresa Garter, MD     Objective:   Filed Vitals:   06/26/15 1035  BP: 149/73  Pulse: 81  Temp: 98.4 F (36.9 C)  TempSrc: Oral  Resp: 20  Height: _0  (1.676 m)  Weight: 243 lb (110.224 kg)  SpO2: 98%    Exam General appearance : Awake, alert, not in any distress. Speech Clear. Not toxic looking, obese HEENT: Atraumatic and Normocephalic, pupils equally reactive to light and accomodation Neck: supple, no JVD. No cervical lymphadenopathy.  Chest:Good air entry bilaterally, no added sounds  CVS: S1 S2 regular, no murmurs.  Abdomen: Bowel sounds present, Non tender and not distended with no gaurding, rigidity or rebound. Extremities: B/L Lower Ext shows mild pitting edema, both legs are warm to touch Neurology: Awake alert, and oriented X 3, CN II-XII intact, Non focal Skin: No Rash  Data Review Lab Results  Component Value Date   HGBA1C 8.2 06/26/2015   HGBA1C 10.10 02/27/2015   HGBA1C 8.7 07/08/2014     Assessment & Plan   1. Type 2 diabetes mellitus without complication, with long-term current use of insulin (HCC)  - Glucose (CBG) - POCT A1C - Microalbumin/Creatinine Ratio, Urine  Aim for 30 minutes of exercise most days. Rethink what you drink. Water is great! Aim for 2-3 Carb Choices per meal (30-45 grams) +/- 1 either way  Aim for 0-15 Carbs per snack if hungry  Include protein in moderation with your meals and snacks  Consider reading food labels for Total Carbohydrate and Fat  Grams of foods  Consider checking BG at alternate times per day  Continue taking medication as directed Be mindful about how much sugar you are adding to beverages and other foods. Fruit Punch - find one with no sugar  Measure and decrease portions of carbohydrate foods  Make your plate and don't go back for seconds  2. Smoking addiction  Deborah Coffey was counseled on the dangers of tobacco use, and was advised to quit. Reviewed strategies to maximize success, including removing cigarettes and smoking materials from environment, stress management and support of family/friends. Patient also referred to [facility] for smoking cessation counseling.  3. Essential hypertension  - nitroGLYCERIN (NITROSTAT) 0.4 MG SL tablet; Place 1 tablet (0.4 mg total) under the tongue every 5 (five) minutes as needed for chest pain.  Dispense: 30 tablet; Refill: 3  We have discussed target BP range and  blood pressure goal. I have advised patient to check BP regularly and to call us back or report to clinic if the numbers are consistently higher than 140/90. We discussed the importance of compliance with medical therapy and DASH diet recommended, consequences of uncontrolled hypertension discussed.   - continue current BP medications  4. Dyslipidemia  To address this please limit saturated fat to no more than 7% of your calories, limit cholesterol to 200 mg/day, increase fiber and exercise as tolerated. If needed we may add another cholesterol lowering medication to your regimen.   5. SOB (shortness of breath) Prescribed - furosemide (LASIX) 20 MG tablet; Take 1 tablet (20 mg total) by mouth daily.  Dispense: 30 tablet; Refill: 3  - Echocardiogram; Future  6. Colon cancer screening  - Ambulatory referral to Gastroenterology  Patient have been counseled extensively about nutrition and exercise  Return in about 3 months (around 09/26/2015) for Hemoglobin A1C and Follow up, DM, Follow up HTN, Heart Failure  and Hypertension.  The patient was given clear instructions to go to ER or return to medical center if symptoms don't improve, worsen or new problems develop. The patient verbalized understanding. The patient was told to call to get lab results if they haven't heard anything in the next week.   This note has been created with Surveyor, quantity. Any transcriptional errors are unintentional.    Angelica Chessman, MD, [place], Karilyn Cota, [place] and Amalga Weinert, [place]   06/26/2015, 6:49 PM

## 2015-06-27 ENCOUNTER — Ambulatory Visit (HOSPITAL_COMMUNITY)
Admission: RE | Admit: 2015-06-27 | Discharge: 2015-06-27 | Disposition: A | Discharge status: Home / Self Care | Payer: No Typology Code available for payment source | Source: Ambulatory Visit | Attending: Internal Medicine | Admitting: Internal Medicine

## 2015-06-27 DIAGNOSIS — I509 Heart failure, unspecified: Secondary | ICD-10-CM | POA: Insufficient documentation

## 2015-06-27 DIAGNOSIS — I517 Cardiomegaly: Secondary | ICD-10-CM | POA: Insufficient documentation

## 2015-06-27 DIAGNOSIS — I272 Other secondary pulmonary hypertension: Secondary | ICD-10-CM | POA: Insufficient documentation

## 2015-06-27 DIAGNOSIS — R0602 Shortness of breath: Secondary | ICD-10-CM | POA: Insufficient documentation

## 2015-06-27 LAB — MICROALBUMIN / CREATININE URINE RATIO
CREATININE, URINE: 115 mg/dL (ref 20–320)
MICROALB/CREAT RATIO: 251 ug/mg{creat} — AB (ref <30)
Microalb, Ur: 28.9 mg/dL

## 2015-06-27 NOTE — Progress Notes (Signed)
  Echocardiogram 2D Echocardiogram has been performed.  Deborah Coffey 06/27/2015, 11:14 AM

## 2015-07-08 ENCOUNTER — Telehealth: Payer: Self-pay | Admitting: *Deleted

## 2015-07-08 NOTE — Telephone Encounter (Signed)
Medical Assistant left message on patient's home and cell voicemail. Voicemail states to give a call back to Marik Sedore with [facility] at [phone].  

## 2015-07-08 NOTE — Telephone Encounter (Signed)
Patient returned nurse phone call. °Please follow up. °

## 2015-07-08 NOTE — Telephone Encounter (Signed)
-----   Message from Tresa Garter, MD sent at 07/07/2015  5:21 PM EDT ----- Please inform patient that her heart ultrasound shows no significant abnormality.

## 2015-07-08 NOTE — Telephone Encounter (Signed)
Patient verified DOB Patient is aware of US of the Heart showing no significant abnormality. Patient expressed her understanding and had no further questions at this time.

## 2015-07-11 MED FILL — METFORMIN HCL ER 500 MG TAB: 500 | 60 days supply | Qty: 240 | Fill #1

## 2015-07-11 MED FILL — NITROSTAT 0.4 MG TABLET SL: 0.4 | 25 days supply | Qty: 25 | Fill #0

## 2015-07-18 ENCOUNTER — Ambulatory Visit: Payer: No Typology Code available for payment source | Attending: Internal Medicine

## 2015-07-28 MED FILL — FUROSEMIDE 20 MG TABLET: 20 | 90 days supply | Qty: 90 | Fill #1

## 2015-07-31 ENCOUNTER — Telehealth: Payer: Self-pay

## 2015-07-31 NOTE — Telephone Encounter (Signed)
CORRECT NUMBER IS [phone]

## 2015-07-31 NOTE — Telephone Encounter (Signed)
[phone]  OR [phone]  PATIENT RECEIVED LETTER TO SCHEDULE TCS

## 2015-08-05 ENCOUNTER — Other Ambulatory Visit: Payer: Self-pay

## 2015-08-05 DIAGNOSIS — Z1231 Encounter for screening mammogram for malignant neoplasm of breast: Secondary | ICD-10-CM

## 2015-08-07 MED FILL — $TOUJEO SOLOSTAR 300 UNIT/M: 300 | 54 days supply | Qty: 3 | Fill #1

## 2015-08-07 NOTE — Telephone Encounter (Signed)
LMOM to call.

## 2015-08-11 NOTE — Telephone Encounter (Signed)
Pt said she had her last colonoscopy about 6 years ago in [place]. She has a hx of polyps but not sure what kind. She is checking on the previous reports and will let me know. She does not have a family hx of colon cancer. She is not having any problems except fro some loose stools.

## 2015-08-21 ENCOUNTER — Ambulatory Visit
Admission: RE | Admit: 2015-08-21 | Discharge: 2015-08-21 | Disposition: A | Discharge status: Home / Self Care | Payer: No Typology Code available for payment source | Source: Ambulatory Visit

## 2015-08-21 DIAGNOSIS — Z1231 Encounter for screening mammogram for malignant neoplasm of breast: Secondary | ICD-10-CM

## 2015-08-26 NOTE — Telephone Encounter (Signed)
LMOM that she will need an OV prior to scheduling the colonoscopy. Also, mailed a letter for her to call.

## 2015-08-26 NOTE — Telephone Encounter (Signed)
Received the reports from her colonoscopy dated 05/12/2005 and 04/17/2011.  Hx of adenomatous polyps. Needs OV first.

## 2015-08-27 ENCOUNTER — Telehealth: Payer: Self-pay | Admitting: *Deleted

## 2015-08-27 NOTE — Telephone Encounter (Signed)
Patient verified DOB Patient is aware of mammogram showing no evidence of malignancy. Patient advised to have a repeat screening in one year. Patient expressed her understanding and had no further questions at this time.

## 2015-08-27 NOTE — Telephone Encounter (Signed)
-----   Message from Tresa Garter, MD sent at 08/22/2015  2:50 PM EDT ----- Please inform patient that her screening mammogram shows no evidence of malignancy. Recommend screening mammogram in one year

## 2015-08-30 ENCOUNTER — Ambulatory Visit (HOSPITAL_COMMUNITY)
Admission: EM | Admit: 2015-08-30 | Discharge: 2015-08-30 | Disposition: A | Discharge status: Home / Self Care | Payer: No Typology Code available for payment source

## 2015-08-30 ENCOUNTER — Encounter (HOSPITAL_COMMUNITY): Payer: Self-pay | Admitting: Emergency Medicine

## 2015-08-30 DIAGNOSIS — H539 Unspecified visual disturbance: Secondary | ICD-10-CM

## 2015-08-30 NOTE — ED Provider Notes (Signed)
CSN: [id number]     Arrival date & time 08/30/15  1339 History   None    Chief Complaint  Patient presents with  . Eye Problem   (Consider location/radiation/quality/duration/timing/severity/associated sxs/prior Treatment)  HPI    The patient is a 60 year old female presenting today with visual changes 2-3 days. Patient is significant for uncontrolled type 2 diabetes hypertension, and status post CABG. Patient Denies injury, itching, pain, foreign body sensation to light. Patient states it's as though she has a "film" with objects that appeared to be similar to spider legs over her vision.  Patient reports she is able to see these objects with her eyelid closed. Denies dizziness, headache or temporal pain.  She wears glasses but does not wear contacts. Patient states her last eye exam was approximately 2 years ago but she is unsure by whom.   Past Medical History  Diagnosis Date  . CAD in native artery 02/2004    Mid LAD lesoin just after Large D1 --> CABG x 1 LIMA-LAD  . 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 competitive flow  . Hypertension   . High cholesterol   . Type II diabetes mellitus ([place])   . Anemia   . History of blood transfusion 2005    "related to [facility]"   . Allergy   . CAP (community acquired pneumonia) 08/07/2014  . Chronic bronchitis ([place])     "I think I get it q yr" (08/07/2014)  . Depression    Past Surgical History  Procedure Laterality Date  . Cesarean section  1977; 1989  . Tubal ligation  1989  . Cholecystectomy  ~ 2012  . Cardiac catheterization  2010    Previous LAD 95% lesion - now ~30-40%; patent LIMA with competitive flow  . Coronary artery bypass graft  02/2004    "CABG X1" (03/22/2013); LIMA-LAD   Family History  Problem Relation Age of Onset  . Heart disease Mother   . Diabetes Mother   . Cancer Mother     cervical  . Diabetes Father   . Lupus Sister   . Heart failure Sister   . Hepatitis C  Sister   . Heart failure Brother   . Diabetes Brother   . Hypertension Daughter    Social History  Substance Use Topics  . Smoking status: Current Every Day Smoker -- 0.50 packs/day for 42 years    Types: Cigarettes  . Smokeless tobacco: Never Used  . Alcohol Use: 0.0 oz/week    0 Standard drinks or equivalent per week     Comment: 08/07/2014 "glass of wine maybe 2 times/yr"   OB History    No data available     Review of Systems  Constitutional: Negative.  Negative for fever and fatigue.  HENT: Negative.  Negative for congestion, dental problem and sinus pressure.   Eyes: Positive for visual disturbance. Negative for photophobia, pain, discharge, redness and itching.       See HPI.   Respiratory: Negative.  Negative for cough and shortness of breath.   Cardiovascular: Negative.  Negative for chest pain and leg swelling.  Gastrointestinal: Negative.   Endocrine: Negative.   Genitourinary: Negative.   Musculoskeletal: Negative.  Negative for gait problem and neck stiffness.  Skin: Negative.   Allergic/Immunologic: Negative.   Neurological: Negative.  Negative for dizziness and headaches.  Hematological: Negative.   Psychiatric/Behavioral: Negative.     Allergies  Ciprofloxacin  Home Medications  Prior to Admission medications   Medication Sig Start Date End Date Taking? Authorizing Provider  aspirin 325 MG tablet Take 325 mg by mouth daily.    Historical Provider, MD  atorvastatin (LIPITOR) 10 MG tablet Take 1 tablet (10 mg total) by mouth daily. 07/08/14   Tresa Garter, MD  Blood Glucose Monitoring Suppl (TRUE METRIX METER) W/DEVICE KIT Use as directed 04/10/15   Tresa Garter, MD  buPROPion (WELLBUTRIN) 100 MG tablet Take 1 tablet (100 mg total) by mouth 2 (two) times daily. Patient not taking: Reported on 06/15/2015 07/08/14   Tresa Garter, MD  cholecalciferol (VITAMIN D) 1000 units tablet Take 1,000 Units by mouth daily.    Historical Provider, MD   furosemide (LASIX) 20 MG tablet Take 1 tablet (20 mg total) by mouth daily. 06/26/15   Tresa Garter, MD  gabapentin (NEURONTIN) 300 MG capsule Take 1 capsule (300 mg total) by mouth 3 (three) times daily. 02/27/15   Tresa Garter, MD  glucose blood (TRUE METRIX BLOOD GLUCOSE TEST) test strip Use as instructed 04/10/15   Tresa Garter, MD  hydrochlorothiazide (HYDRODIURIL) 12.5 MG tablet Take 1 tablet (12.5 mg total) by mouth daily. 07/08/14   Tresa Garter, MD  Insulin Glargine (TOUJEO SOLOSTAR) 300 UNIT/ML SOPN Inject 25 Units into the skin daily with breakfast. 05/07/15   Tresa Garter, MD  lisinopril (PRINIVIL,ZESTRIL) 10 MG tablet Take 1 tablet (10 mg total) by mouth daily. 07/08/14   Tresa Garter, MD  metFORMIN (GLUCOPHAGE-XR) 500 MG 24 hr tablet TAKE 2 TABLETS BY MOUTH 2 TIMES DAILY WITH A MEAL 05/26/15   Tresa Garter, MD  metoprolol (LOPRESSOR) 50 MG tablet Take 1 tablet (50 mg total) by mouth 2 (two) times daily. 07/08/14   Tresa Garter, MD  Multiple Vitamin (MULTIVITAMIN WITH MINERALS) TABS tablet Take 1 tablet by mouth daily.    Historical Provider, MD  nitroGLYCERIN (NITROSTAT) 0.4 MG SL tablet Place 1 tablet (0.4 mg total) under the tongue every 5 (five) minutes as needed for chest pain. 06/26/15   Tresa Garter, MD  pantoprazole (PROTONIX) 40 MG tablet Take 1 tablet (40 mg total) by mouth daily. 03/12/15   Tresa Garter, MD  TRUEPLUS LANCETS 28G MISC Use as directed 04/10/15   Tresa Garter, MD   Meds Ordered and Administered this Visit  Medications - No data to display  BP 163/70 mmHg  Pulse 84  Temp(Src) 98 F (36.7 C) (Oral)  SpO2 100% No data found.   Physical Exam  Constitutional: She is oriented to person, place, and time. She appears well-developed and well-nourished. No distress.  HENT:  Head: Normocephalic and atraumatic.  Mouth/Throat: Oropharynx is clear and moist.  Eyes: Conjunctivae and EOM are  normal. Pupils are equal, round, and reactive to light. Right eye exhibits no discharge and no exudate. Left eye exhibits no discharge and no exudate. Right conjunctiva is not injected. Right conjunctiva has no hemorrhage. Left conjunctiva is not injected. Left conjunctiva has no hemorrhage. No scleral icterus. Right pupil is reactive. Left pupil is reactive.  Fundoscopic exam:      The right eye shows red reflex.       The left eye shows red reflex.  + red reflex bilaterally.   Unable to visualize more than some diabetic retinopathy.   Neck: Normal range of motion. Neck supple.  Cardiovascular: Normal rate, regular rhythm, normal heart sounds and intact distal pulses.  Exam reveals no  gallop and no friction rub.   No murmur heard. Pulmonary/Chest: Effort normal and breath sounds normal. No respiratory distress. She has no wheezes. She has no rales. She exhibits no tenderness.  Lymphadenopathy:    She has no cervical adenopathy.  Neurological: She is alert and oriented to person, place, and time. She has normal strength. She is not disoriented. No sensory deficit. She exhibits normal muscle tone. Coordination and gait normal.  Skin: Skin is warm and dry. No rash noted. She is not diaphoretic. No erythema. No pallor.  Nursing note and vitals reviewed.      ED Course  Procedures (including critical care time)  Labs Review Labs Reviewed - No data to display  Imaging Review No results found.   Visual Acuity Review  Right Eye Distance: 20/50 Left Eye Distance: 20/100 Bilateral Distance: 20/50  Consulted with Dr. Bridgett Larsson and Dr. Maylene Roes - on call opthamology.  He will come meet patient at his office at the professional building at [address].     MDM   1. Visual disturbance    The patient verbalizes understanding and agrees to plan of care.       Nehemiah Settle, NP 08/30/15 [phone]

## 2015-08-30 NOTE — ED Notes (Signed)
Pt. Stated, Ive had eye friction since Tuesday.

## 2015-08-30 NOTE — Discharge Instructions (Signed)
Follow up with Dr. Maylene Roes now.

## 2015-09-01 ENCOUNTER — Telehealth: Payer: Self-pay | Admitting: Internal Medicine

## 2015-09-01 NOTE — Telephone Encounter (Signed)
Pt. Called stating she went to urgent care because of her eye sight. The doctor that saw her there said she needed to have a laser surgery because something was wrong with her eyes. Pt. Also stated she needed the surgery ASAP because she could loose her eye sight. Please f/u with pt.

## 2015-09-04 ENCOUNTER — Ambulatory Visit: Payer: Self-pay | Attending: Internal Medicine | Admitting: Internal Medicine

## 2015-09-04 ENCOUNTER — Encounter: Payer: Self-pay | Admitting: Internal Medicine

## 2015-09-04 VITALS — BP 124/77 | HR 80 | Temp 98.5°F | Resp 18 | Ht 66.0 in | Wt 244.0 lb

## 2015-09-04 DIAGNOSIS — F17200 Nicotine dependence, unspecified, uncomplicated: Secondary | ICD-10-CM

## 2015-09-04 DIAGNOSIS — Z7982 Long term (current) use of aspirin: Secondary | ICD-10-CM | POA: Insufficient documentation

## 2015-09-04 DIAGNOSIS — E118 Type 2 diabetes mellitus with unspecified complications: Secondary | ICD-10-CM | POA: Insufficient documentation

## 2015-09-04 DIAGNOSIS — E785 Hyperlipidemia, unspecified: Secondary | ICD-10-CM | POA: Insufficient documentation

## 2015-09-04 DIAGNOSIS — H43399 Other vitreous opacities, unspecified eye: Secondary | ICD-10-CM | POA: Insufficient documentation

## 2015-09-04 DIAGNOSIS — Z7984 Long term (current) use of oral hypoglycemic drugs: Secondary | ICD-10-CM | POA: Insufficient documentation

## 2015-09-04 DIAGNOSIS — Z794 Long term (current) use of insulin: Secondary | ICD-10-CM | POA: Insufficient documentation

## 2015-09-04 DIAGNOSIS — I2510 Atherosclerotic heart disease of native coronary artery without angina pectoris: Secondary | ICD-10-CM | POA: Insufficient documentation

## 2015-09-04 DIAGNOSIS — I10 Essential (primary) hypertension: Secondary | ICD-10-CM | POA: Insufficient documentation

## 2015-09-04 DIAGNOSIS — H43392 Other vitreous opacities, left eye: Secondary | ICD-10-CM | POA: Insufficient documentation

## 2015-09-04 DIAGNOSIS — Z79899 Other long term (current) drug therapy: Secondary | ICD-10-CM | POA: Insufficient documentation

## 2015-09-04 DIAGNOSIS — F329 Major depressive disorder, single episode, unspecified: Secondary | ICD-10-CM | POA: Insufficient documentation

## 2015-09-04 LAB — GLUCOSE, POCT (MANUAL RESULT ENTRY): POC GLUCOSE: 203 mg/dL — AB (ref 70–99)

## 2015-09-04 NOTE — Progress Notes (Signed)
Patient ID: Deborah Coffey, female   DOB: [birth year], 59 y.o.   MRN: [id number]   Deborah Coffey, is a 60 y.o. female  [id number]  [id number]  DOB - 1955/09/30  Chief Complaint  Patient presents with  . Referral        Subjective:   Deborah Coffey is a 60 y.o. female with history of uncontrolled type 2 diabetes, hypertension, CAD s/p CABGhere today for a Referral to Opthomalogist. Patient called the clinic this morning stating she went to urgent care recently because of her eye sight and was told she needed to have a laser surgery on her eyes. Patient complains of very blurry vision in the left eye, seeing floaters and objects resembling cobwebs. Patient is here for the urgent ophthalmology referral. Patient has No headache, No chest pain, No abdominal pain - No Nausea, No new weakness tingling or numbness, No Cough - SOB. She continues to smoke heavily and blood sugar still uncontrolled because of non-adherent with medications and nutrition.    Problem  Floaters in Visual Field    ALLERGIES: Allergies  Allergen Reactions  . Ciprofloxacin Rash    PAST MEDICAL HISTORY: Past Medical History  Diagnosis Date  . CAD in native artery 02/2004    Mid LAD lesoin just after Large D1 --> CABG x 1 LIMA-LAD  . 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 competitive flow  . Hypertension   . High cholesterol   . Type II diabetes mellitus ([place])   . Anemia   . History of blood transfusion 2005    "related to [facility]"   . Allergy   . CAP (community acquired pneumonia) 08/07/2014  . Chronic bronchitis ([place])     "I think I get it q yr" (08/07/2014)  . Depression     MEDICATIONS AT HOME: Prior to Admission medications   Medication Sig Start Date End Date Taking? Authorizing Provider  aspirin 325 MG tablet Take 325 mg by mouth daily.   Yes Historical Provider, MD  atorvastatin (LIPITOR) 10 MG tablet Take 1 tablet (10 mg total) by  mouth daily. 07/08/14  Yes Tresa Garter, MD  Blood Glucose Monitoring Suppl (TRUE METRIX METER) W/DEVICE KIT Use as directed 04/10/15  Yes Evan Mackie E Doreene Burke, MD  buPROPion (WELLBUTRIN) 100 MG tablet Take 1 tablet (100 mg total) by mouth 2 (two) times daily. 07/08/14  Yes Tresa Garter, MD  cholecalciferol (VITAMIN D) 1000 units tablet Take 1,000 Units by mouth daily.   Yes Historical Provider, MD  furosemide (LASIX) 20 MG tablet Take 1 tablet (20 mg total) by mouth daily. 06/26/15  Yes Tresa Garter, MD  gabapentin (NEURONTIN) 300 MG capsule Take 1 capsule (300 mg total) by mouth 3 (three) times daily. 02/27/15  Yes Tresa Garter, MD  glucose blood (TRUE METRIX BLOOD GLUCOSE TEST) test strip Use as instructed 04/10/15  Yes Erline Siddoway E Doreene Burke, MD  hydrochlorothiazide (HYDRODIURIL) 12.5 MG tablet Take 1 tablet (12.5 mg total) by mouth daily. 07/08/14  Yes Tresa Garter, MD  Insulin Glargine (TOUJEO SOLOSTAR) 300 UNIT/ML SOPN Inject 25 Units into the skin daily with breakfast. 05/07/15  Yes Cherish Runde E Doreene Burke, MD  lisinopril (PRINIVIL,ZESTRIL) 10 MG tablet Take 1 tablet (10 mg total) by mouth daily. 07/08/14  Yes Tresa Garter, MD  metFORMIN (GLUCOPHAGE-XR) 500 MG 24 hr tablet TAKE 2 TABLETS BY MOUTH 2 TIMES DAILY WITH A MEAL 05/26/15  Yes Tyreque Finken E Pearl Berlinger,  MD  metoprolol (LOPRESSOR) 50 MG tablet Take 1 tablet (50 mg total) by mouth 2 (two) times daily. 07/08/14  Yes Tresa Garter, MD  Multiple Vitamin (MULTIVITAMIN WITH MINERALS) TABS tablet Take 1 tablet by mouth daily.   Yes Historical Provider, MD  nitroGLYCERIN (NITROSTAT) 0.4 MG SL tablet Place 1 tablet (0.4 mg total) under the tongue every 5 (five) minutes as needed for chest pain. 06/26/15  Yes Tresa Garter, MD  pantoprazole (PROTONIX) 40 MG tablet Take 1 tablet (40 mg total) by mouth daily. 03/12/15  Yes Tresa Garter, MD  TRUEPLUS LANCETS 28G MISC Use as directed 04/10/15  Yes Tresa Garter, MD     Objective:   Filed Vitals:   09/04/15 1423  BP: 124/77  Pulse: 80  Temp: 98.5 F (36.9 C)  TempSrc: Oral  Resp: 18  Height: '5\' 6"'$  (1.676 m)  Weight: 244 lb (110.678 kg)  SpO2: 100%    Exam General appearance : Awake, alert, not in any distress. Speech Clear. Not toxic looking HEENT: Atraumatic and Normocephalic, pupils equally reactive to light and accomodation Neck: supple, no JVD. No cervical lymphadenopathy.  Chest:Good air entry bilaterally, no added sounds  CVS: S1 S2 regular, no murmurs.  Abdomen: Bowel sounds present, Non tender and not distended with no gaurding, rigidity or rebound. Extremities: B/L Lower Ext shows no edema, both legs are warm to touch Neurology: Awake alert, and oriented X 3, CN II-XII intact, Non focal Skin: No Rash  Data Review Lab Results  Component Value Date   HGBA1C 8.2 06/26/2015   HGBA1C 10.10 02/27/2015   HGBA1C 8.7 07/08/2014     Assessment & Plan   1. Type 2 diabetes mellitus with complication, with long-term current use of insulin (HCC)  - Glucose (CBG) - Ambulatory referral to Ophthalmology  2. Smoking addiction Deborah was counseled on the dangers of tobacco use, and was advised to quit. Reviewed strategies to maximize success, including removing cigarettes and smoking materials from environment, stress management and support of family/friends.   3. Essential hypertension We have discussed target BP range and blood pressure goal. I have advised patient to check BP regularly and to call us back or report to clinic if the numbers are consistently higher than 140/90. We discussed the importance of compliance with medical therapy and DASH diet recommended, consequences of uncontrolled hypertension discussed.   - continue current BP medications  4. Dyslipidemia To address this please limit saturated fat to no more than 7% of your calories, limit cholesterol to 200 mg/day, increase fiber and exercise as  tolerated. If needed we may add another cholesterol lowering medication to your regimen.   5. Floaters in visual field, left  - Ambulatory referral to Ophthalmology  Patient have been counseled extensively about nutrition and exercise  Return in about 4 weeks (around 10/02/2015) for Hemoglobin A1C and Follow up, DM, Follow up HTN.  The patient was given clear instructions to go to ER or return to medical center if symptoms don't improve, worsen or new problems develop. The patient verbalized understanding. The patient was told to call to get lab results if they haven't heard anything in the next week.   This note has been created with Survey[employer], quantity. Any transcriptional errors are unintentional.    Angelica Chessman, MD, [place], [place], [place], [place] and [place], [place]   09/04/2015, 2:40 PM

## 2015-09-04 NOTE — Patient Instructions (Signed)
Smoking Cessation, Tips for Success If you are ready to quit smoking, congratulations! You have chosen to help yourself be healthier. Cigarettes bring nicotine, tar, carbon monoxide, and other irritants into your body. Your lungs, heart, and blood vessels will be able to work better without these poisons. There are many different ways to quit smoking. Nicotine gum, nicotine patches, a nicotine inhaler, or nicotine nasal spray can help with physical craving. Hypnosis, support groups, and medicines help break the habit of smoking. WHAT THINGS CAN I DO TO MAKE QUITTING EASIER?  Here are some tips to help you quit for good:  Pick a date when you will quit smoking completely. Tell all of your friends and family about your plan to quit on that date.  Do not try to slowly cut down on the number of cigarettes you are smoking. Pick a quit date and quit smoking completely starting on that day.  Throw away all cigarettes.   Clean and remove all ashtrays from your home, work, and car.  On a card, write down your reasons for quitting. Carry the card with you and read it when you get the urge to smoke.  Cleanse your body of nicotine. Drink enough water and fluids to keep your urine clear or pale yellow. Do this after quitting to flush the nicotine from your body.  Learn to predict your moods. Do not let a bad situation be your excuse to have a cigarette. Some situations in your life might tempt you into wanting a cigarette.  Never have "just one" cigarette. It leads to wanting another and another. Remind yourself of your decision to quit.  Change habits associated with smoking. If you smoked while driving or when feeling stressed, try other activities to replace smoking. Stand up when drinking your coffee. Brush your teeth after eating. Sit in a different chair when you read the paper. Avoid alcohol while trying to quit, and try to drink fewer caffeinated beverages. Alcohol and caffeine may urge you to  smoke.  Avoid foods and drinks that can trigger a desire to smoke, such as sugary or spicy foods and alcohol.  Ask people who smoke not to smoke around you.  Have something planned to do right after eating or having a cup of coffee. For example, plan to take a walk or exercise.  Try a relaxation exercise to calm you down and decrease your stress. Remember, you may be tense and nervous for the first 2 weeks after you quit, but this will pass.  Find new activities to keep your hands busy. Play with a pen, coin, or rubber band. Doodle or draw things on paper.  Brush your teeth right after eating. This will help cut down on the craving for the taste of tobacco after meals. You can also try mouthwash.   Use oral substitutes in place of cigarettes. Try using lemon drops, carrots, cinnamon sticks, or chewing gum. Keep them handy so they are available when you have the urge to smoke.  When you have the urge to smoke, try deep breathing.  Designate your home as a nonsmoking area.  If you are a heavy smoker, ask your health care provider about a prescription for nicotine chewing gum. It can ease your withdrawal from nicotine.  Reward yourself. Set aside the cigarette money you save and buy yourself something nice.  Look for support from others. Join a support group or smoking cessation program. Ask someone at home or at work to help you with your plan   to quit smoking.  Always ask yourself, "Do I need this cigarette or is this just a reflex?" Tell yourself, "Today, I choose not to smoke," or "I do not want to smoke." You are reminding yourself of your decision to quit.  Do not replace cigarette smoking with electronic cigarettes (commonly called e-cigarettes). The safety of e-cigarettes is unknown, and some may contain harmful chemicals.  If you relapse, do not give up! Plan ahead and think about what you will do the next time you get the urge to smoke. HOW WILL I FEEL WHEN I QUIT SMOKING? You  may have symptoms of withdrawal because your body is used to nicotine (the addictive substance in cigarettes). You may crave cigarettes, be irritable, feel very hungry, cough often, get headaches, or have difficulty concentrating. The withdrawal symptoms are only temporary. They are strongest when you first quit but will go away within 10-14 days. When withdrawal symptoms occur, stay in control. Think about your reasons for quitting. Remind yourself that these are signs that your body is healing and getting used to being without cigarettes. Remember that withdrawal symptoms are easier to treat than the major diseases that smoking can cause.  Even after the withdrawal is over, expect periodic urges to smoke. However, these cravings are generally short lived and will go away whether you smoke or not. Do not smoke! WHAT RESOURCES ARE AVAILABLE TO HELP ME QUIT SMOKING? Your health care provider can direct you to community resources or hospitals for support, which may include:  Group support.  Education.  Hypnosis.  Therapy.   This information is not intended to replace advice given to you by your health care provider. Make sure you discuss any questions you have with your health care provider.   Document Released: 01/02/2004 Document Revised: 04/26/2014 Document Reviewed: 09/21/2012 Elsevier Interactive Patient Education 2016 [employer]. Diabetes and Exercise Exercising regularly is important. It is not just about losing weight. It has many health benefits, such as:  Improving your overall fitness, flexibility, and endurance.  Increasing your bone density.  Helping with weight control.  Decreasing your body fat.  Increasing your muscle strength.  Reducing stress and tension.  Improving your overall health. People with diabetes who exercise gain additional benefits because exercise:  Reduces appetite.  Improves the body's use of blood sugar (glucose).  Helps lower or control  blood glucose.  Decreases blood pressure.  Helps control blood lipids (such as cholesterol and triglycerides).  Improves the body's use of the hormone insulin by:  Increasing the body's insulin sensitivity.  Reducing the body's insulin needs.  Decreases the risk for heart disease because exercising:  Lowers cholesterol and triglycerides levels.  Increases the levels of good cholesterol (such as high-density lipoproteins [HDL]) in the body.  Lowers blood glucose levels. YOUR ACTIVITY PLAN  Choose an activity that you enjoy, and set realistic goals. To exercise safely, you should begin practicing any new physical activity slowly, and gradually increase the intensity of the exercise over time. Your health care provider or diabetes educator can help create an activity plan that works for you. General recommendations include:  Encouraging children to engage in at least 60 minutes of physical activity each day.  Stretching and performing strength training exercises, such as yoga or weight lifting, at least 2 times per week.  Performing a total of at least 150 minutes of moderate-intensity exercise each week, such as brisk walking or water aerobics.  Exercising at least 3 days per week, making sure you  allow no more than 2 consecutive days to pass without exercising.  Avoiding long periods of inactivity (90 minutes or more). When you have to spend an extended period of time sitting down, take frequent breaks to walk or stretch. RECOMMENDATIONS FOR EXERCISING WITH TYPE 1 OR TYPE 2 DIABETES   Check your blood glucose before exercising. If blood glucose levels are greater than 240 mg/dL, check for urine ketones. Do not exercise if ketones are present.  Avoid injecting insulin into areas of the body that are going to be exercised. For example, avoid injecting insulin into:  The arms when playing tennis.  The legs when jogging.  Keep a record of:  Food intake before and after you  exercise.  Expected peak times of insulin action.  Blood glucose levels before and after you exercise.  The type and amount of exercise you have done.  Review your records with your health care provider. Your health care provider will help you to develop guidelines for adjusting food intake and insulin amounts before and after exercising.  If you take insulin or oral hypoglycemic agents, watch for signs and symptoms of hypoglycemia. They include:  Dizziness.  Shaking.  Sweating.  Chills.  Confusion.  Drink plenty of water while you exercise to prevent dehydration or heat stroke. Body water is lost during exercise and must be replaced.  Talk to your health care provider before starting an exercise program to make sure it is safe for you. Remember, almost any type of activity is better than none.   This information is not intended to replace advice given to you by your health care provider. Make sure you discuss any questions you have with your health care provider.   Document Released: 06/26/2003 Document Revised: 08/20/2014 Document Reviewed: 09/12/2012 Elsevier Interactive Patient Education 2016 [place]. Diabetes and Foot Care Diabetes may cause you to have problems because of poor blood supply (circulation) to your feet and legs. This may cause the skin on your feet to become thinner, break easier, and heal more slowly. Your skin may become dry, and the skin may peel and crack. You may also have nerve damage in your legs and feet causing decreased feeling in them. You may not notice minor injuries to your feet that could lead to infections or more serious problems. Taking care of your feet is one of the most important things you can do for yourself.  HOME CARE INSTRUCTIONS  Wear shoes at all times, even in the house. Do not go barefoot. Bare feet are easily injured.  Check your feet daily for blisters, cuts, and redness. If you cannot see the bottom of your feet, use a  mirror or ask someone for help.  Wash your feet with warm water (do not use hot water) and mild soap. Then pat your feet and the areas between your toes until they are completely dry. Do not soak your feet as this can dry your skin.  Apply a moisturizing lotion or petroleum jelly (that does not contain alcohol and is unscented) to the skin on your feet and to dry, brittle toenails. Do not apply lotion between your toes.  Trim your toenails straight across. Do not dig under them or around the cuticle. File the edges of your nails with an emery board or nail file.  Do not cut corns or calluses or try to remove them with medicine.  Wear clean socks or stockings every day. Make sure they are not too tight. Do not wear knee-high  stockings since they may decrease blood flow to your legs.  Wear shoes that fit properly and have enough cushioning. To break in new shoes, wear them for just a few hours a day. This prevents you from injuring your feet. Always look in your shoes before you put them on to be sure there are no objects inside.  Do not cross your legs. This may decrease the blood flow to your feet.  If you find a minor scrape, cut, or break in the skin on your feet, keep it and the skin around it clean and dry. These areas may be cleansed with mild soap and water. Do not cleanse the area with peroxide, alcohol, or iodine.  When you remove an adhesive bandage, be sure not to damage the skin around it.  If you have a wound, look at it several times a day to make sure it is healing.  Do not use heating pads or hot water bottles. They may burn your skin. If you have lost feeling in your feet or legs, you may not know it is happening until it is too late.  Make sure your health care provider performs a complete foot exam at least annually or more often if you have foot problems. Report any cuts, sores, or bruises to your health care provider immediately. SEEK MEDICAL CARE IF:   You have an  injury that is not healing.  You have cuts or breaks in the skin.  You have an ingrown nail.  You notice redness on your legs or feet.  You feel burning or tingling in your legs or feet.  You have pain or cramps in your legs and feet.  Your legs or feet are numb.  Your feet always feel cold. SEEK IMMEDIATE MEDICAL CARE IF:   There is increasing redness, swelling, or pain in or around a wound.  There is a red line that goes up your leg.  Pus is coming from a wound.  You develop a fever or as directed by your health care provider.  You notice a bad smell coming from an ulcer or wound.   This information is not intended to replace advice given to you by your health care provider. Make sure you discuss any questions you have with your health care provider.   Document Released: 04/02/2000 Document Revised: 12/06/2012 Document Reviewed: 09/12/2012 Elsevier Interactive Patient Education 2016 [place] Carbohydrate Counting for Diabetes Mellitus Carbohydrate counting is a method for keeping track of the amount of carbohydrates you eat. Eating carbohydrates naturally increases the level of sugar (glucose) in your blood, so it is important for you to know the amount that is okay for you to have in every meal. Carbohydrate counting helps keep the level of glucose in your blood within normal limits. The amount of carbohydrates allowed is different for every person. A dietitian can help you calculate the amount that is right for you. Once you know the amount of carbohydrates you can have, you can count the carbohydrates in the foods you want to eat. Carbohydrates are found in the following foods:  Grains, such as breads and cereals.  Dried beans and soy products.  Starchy vegetables, such as potatoes, peas, and corn.  Fruit and fruit juices.  Milk and yogurt.  Sweets and snack foods, such as cake, cookies, candy, chips, soft drinks, and fruit drinks. CARBOHYDRATE  COUNTING There are two ways to count the carbohydrates in your food. You can use either of the methods or  a combination of both. Reading the "Nutrition Facts" on [place] The "Nutrition Facts" is an area that is included on the labels of almost all packaged food and beverages in the [place]. It includes the serving size of that food or beverage and information about the nutrients in each serving of the food, including the grams (g) of carbohydrate per serving.  Decide the number of servings of this food or beverage that you will be able to eat or drink. Multiply that number of servings by the number of grams of carbohydrate that is listed on the label for that serving. The total will be the amount of carbohydrates you will be having when you eat or drink this food or beverage. Learning Standard Serving Sizes of Food When you eat food that is not packaged or does not include "Nutrition Facts" on the label, you need to measure the servings in order to count the amount of carbohydrates.A serving of most carbohydrate-rich foods contains about 15 g of carbohydrates. The following list includes serving sizes of carbohydrate-rich foods that provide 15 g ofcarbohydrate per serving:   1 slice of bread (1 oz) or 1 six-inch tortilla.    of a hamburger bun or English muffin.  4-6 crackers.   cup unsweetened dry cereal.    cup hot cereal.   cup rice or pasta.    cup mashed potatoes or  of a large baked potato.  1 cup fresh fruit or one small piece of fruit.    cup canned or frozen fruit or fruit juice.  1 cup milk.   cup plain fat-free yogurt or yogurt sweetened with artificial sweeteners.   cup cooked dried beans or starchy vegetable, such as peas, corn, or potatoes.  Decide the number of standard-size servings that you will eat. Multiply that number of servings by 15 (the grams of carbohydrates in that serving). For example, if you eat 2 cups of strawberries, you will  have eaten 2 servings and 30 g of carbohydrates (2 servings x 15 g = 30 g). For foods such as soups and casseroles, in which more than one food is mixed in, you will need to count the carbohydrates in each food that is included. EXAMPLE OF CARBOHYDRATE COUNTING Sample Dinner  3 oz chicken breast.   cup of brown rice.   cup of corn.  1 cup milk.   1 cup strawberries with sugar-free whipped topping.  Carbohydrate Calculation Step 1: Identify the foods that contain carbohydrates:   Rice.   Corn.   Milk.   Strawberries. Step 2:Calculate the number of servings eaten of each:   2 servings of rice.   1 serving of corn.   1 serving of milk.   1 serving of strawberries. Step 3: Multiply each of those number of servings by 15 g:   2 servings of rice x 15 g = 30 g.   1 serving of corn x 15 g = 15 g.   1 serving of milk x 15 g = 15 g.   1 serving of strawberries x 15 g = 15 g. Step 4: Add together all of the amounts to find the total grams of carbohydrates eaten: 30 g + 15 g + 15 g + 15 g = 75 g.   This information is not intended to replace advice given to you by your health care provider. Make sure you discuss any questions you have with your health care provider.   Document Released: 04/05/2005 Document Revised:  04/26/2014 Document Reviewed: 03/02/2013 Elsevier Interactive Patient Education [employer].

## 2015-09-04 NOTE — Progress Notes (Signed)
Patient is here for Referral to Opthomalogist.  Patient denies pain at this time.  Patient complains of very blurry vision in the left eye.  Patient has taken medication today and patient has eaten today.

## 2015-09-05 ENCOUNTER — Telehealth: Payer: Self-pay | Admitting: Internal Medicine

## 2015-09-05 ENCOUNTER — Encounter (INDEPENDENT_AMBULATORY_CARE_PROVIDER_SITE_OTHER): Payer: Self-pay | Admitting: Ophthalmology

## 2015-09-05 NOTE — Telephone Encounter (Signed)
Patient needs to be referred to a retina specialist Please follow up.

## 2015-09-11 ENCOUNTER — Ambulatory Visit: Payer: Self-pay

## 2015-09-17 ENCOUNTER — Ambulatory Visit: Payer: Self-pay | Admitting: Nurse Practitioner

## 2015-09-21 ENCOUNTER — Other Ambulatory Visit: Payer: Self-pay | Admitting: Internal Medicine

## 2015-09-22 ENCOUNTER — Other Ambulatory Visit: Payer: Self-pay | Admitting: Internal Medicine

## 2015-09-22 MED FILL — ATORVASTATIN 10 MG TABLET: 10 | 90 days supply | Qty: 90 | Fill #0

## 2015-09-22 MED FILL — METOPROLOL TARTRATE 50 MG T: 50 | 90 days supply | Qty: 180 | Fill #0

## 2015-09-22 MED FILL — LISINOPRIL 10 MG TABLET: 10 | 90 days supply | Qty: 90 | Fill #0

## 2015-09-22 MED FILL — HYDROCHLOROTHIAZIDE 12.5 MG: 12.5 | 90 days supply | Qty: 90 | Fill #0

## 2015-09-24 MED FILL — $TOUJEO SOLOSTAR 300 UNIT/M: 300 | 54 days supply | Qty: 3 | Fill #2

## 2015-09-24 NOTE — Telephone Encounter (Signed)
I spoke to patient last week and I give her the information to contact [facility] . Patient don't have insurance.

## 2015-09-30 NOTE — Telephone Encounter (Signed)
Patient had an appointment and was scheduled with opthalmology.

## 2015-10-01 ENCOUNTER — Other Ambulatory Visit: Payer: Self-pay

## 2015-10-01 ENCOUNTER — Encounter: Payer: Self-pay | Admitting: Nurse Practitioner

## 2015-10-01 ENCOUNTER — Ambulatory Visit (INDEPENDENT_AMBULATORY_CARE_PROVIDER_SITE_OTHER): Payer: No Typology Code available for payment source | Admitting: Nurse Practitioner

## 2015-10-01 VITALS — BP 130/70 | HR 94 | Temp 98.3°F | Ht 68.0 in | Wt 241.0 lb

## 2015-10-01 DIAGNOSIS — Z86010 Personal history of colonic polyps: Secondary | ICD-10-CM

## 2015-10-01 MED ORDER — PHOSPHATE LAXATIVE 2.7-7.2 GM/15ML PO SOLN: 15.0000 mL | Freq: Once | ORAL | Status: DC

## 2015-10-01 NOTE — Progress Notes (Signed)
Primary Care Physician:  Angelica Chessman, MD Primary Gastroenterologist:  Dr. Gala Romney  Chief Complaint  Patient presents with  . Colonoscopy    HPI:   Deborah Coffey is a 60 y.o. female who presents on referral from primary care to schedule a colonoscopy due to history of adenomatous colon polyps. Previous colonoscopies in 2007 and 2012 in [place]. The records were obtained by our staff. Endoscopy completed 2007 found 4 small sessile polyps rectosigmoid colon, 2 small sessile polyps cecum and proximal right colon, pandiverticulosis. Polyps found to be a mix of adenomatous polyps and hyperplastic polyps. Most recent colonoscopy completed in 2012 which found multiple polyps removed, small internal hemorrhoids, pandiverticulosis. Surgical pathology found the colon polyps to be a mix of tubular adenoma and hyperplastic polyps.   Today she states she's doing well overall. Having a lot of floaters and is trying to get in to an eye doctor being that she doesn't have insurance. No changes in her vision. Previous colonoscopies were done by Dr. Collene Mares in [place]. She was referred here to [facility] by PCP at wellness center next to [facility]. Denies abdominal pain, N/V, hematochezia, melena. Has diarrhea, post-cholecystectomy 2013. States her diarrhea is not new, has been occurring since her gallbladder was removed. Diarrhea is intermittent. States she thinks its related to her po intake, noted worse with fried and high-fat foods. Denies fever, chills, unintentional weight loss. Has occasional chest heaviness and underwent cardiac workup which she states was fine. Was started on Protonix which improved her chest heaviness. Denies dyspnea, dizziness, lightheadedness, syncope, near syncope. Denies any other upper or lower GI symptoms.  Past Medical History  Diagnosis Date  . CAD in native artery 02/2004    Mid LAD lesoin just after Large D1 --> CABG x 1 LIMA-LAD  . 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 competitive flow  . Hypertension   . High cholesterol   . Type II diabetes mellitus ([place])   . Anemia   . History of blood transfusion 2005    "related to [facility]"   . Allergy   . CAP (community acquired pneumonia) 08/07/2014  . Chronic bronchitis ([place])     "I think I get it q yr" (08/07/2014)  . Depression     Past Surgical History  Procedure Laterality Date  . Cesarean section  1977; 1989  . Tubal ligation  1989  . Cholecystectomy  ~ 2012  . Cardiac catheterization  2010    Previous LAD 95% lesion - now ~30-40%; patent LIMA with competitive flow  . Coronary artery bypass graft  02/2004    "CABG X1" (03/22/2013); LIMA-LAD    Current Outpatient Prescriptions  Medication Sig Dispense Refill  . aspirin 325 MG tablet Take 325 mg by mouth daily.    Marland Kitchen atorvastatin (LIPITOR) 10 MG tablet TAKE 1 TABLET BY MOUTH DAILY. 90 tablet 0  . Blood Glucose Monitoring Suppl (TRUE METRIX METER) W/DEVICE KIT Use as directed 1 kit 0  . buPROPion (WELLBUTRIN) 100 MG tablet Take 1 tablet (100 mg total) by mouth 2 (two) times daily. 60 tablet 3  . cholecalciferol (VITAMIN D) 1000 units tablet Take 1,000 Units by mouth daily.    . furosemide (LASIX) 20 MG tablet TAKE 1 TABLET BY MOUTH DAILY. 30 tablet 2  . gabapentin (NEURONTIN) 300 MG capsule Take 1 capsule (300 mg total) by mouth 3 (three) times daily. 90 capsule 3  . glucose  blood (TRUE METRIX BLOOD GLUCOSE TEST) test strip Use as instructed 100 each 12  . hydrochlorothiazide (MICROZIDE) 12.5 MG capsule TAKE ONE CAPSULE BY MOUTH DAILY 90 capsule 0  . Insulin Glargine (TOUJEO SOLOSTAR) 300 UNIT/ML SOPN Inject 25 Units into the skin daily with breakfast. 9 mL 3  . lisinopril (PRINIVIL,ZESTRIL) 10 MG tablet TAKE 1 TABLET BY MOUTH DAILY 90 tablet 0  . metFORMIN (GLUCOPHAGE-XR) 500 MG 24 hr tablet TAKE 2 TABLETS BY MOUTH 2 TIMES DAILY WITH A MEAL 120 tablet 2  . metoprolol (LOPRESSOR) 50  MG tablet TAKE 1 TABLET BY MOUTH 2 TIMES DAILY 180 tablet 0  . Multiple Vitamin (MULTIVITAMIN WITH MINERALS) TABS tablet Take 1 tablet by mouth daily.    . nitroGLYCERIN (NITROSTAT) 0.4 MG SL tablet Place 1 tablet (0.4 mg total) under the tongue every 5 (five) minutes as needed for chest pain. 30 tablet 3  . pantoprazole (PROTONIX) 40 MG tablet Take 1 tablet (40 mg total) by mouth daily. 90 tablet 3  . TRUEPLUS LANCETS 28G MISC Use as directed 100 each 12   No current facility-administered medications for this visit.    Allergies as of 10/01/2015 - Review Complete 10/01/2015  Allergen Reaction Noted  . Ciprofloxacin Rash 10/05/2010    Family History  Problem Relation Age of Onset  . Heart disease Mother   . Diabetes Mother   . Cancer Mother     cervical  . Diabetes Father   . Lupus Sister   . Heart failure Sister   . Hepatitis C Sister   . Heart failure Brother   . Diabetes Brother   . Hypertension Daughter   . Colon cancer Neg Hx     Social History   Social History  . Marital Status: Divorced    Spouse Name: N/A  . Number of Children: N/A  . Years of Education: N/A   Occupational History  . Not on file.   Social History Main Topics  . Smoking status: Current Every Day Smoker -- 0.50 packs/day for 42 years    Types: Cigarettes  . Smokeless tobacco: Never Used  . Alcohol Use: 0.0 oz/week    0 Standard drinks or equivalent per week     Comment: 08/07/2014 "glass of wine maybe 2 times/yr"  . Drug Use: No     Comment: smoked pot years ago but not currently  . Sexual Activity: Not Currently   Other Topics Concern  . Not on file   Social History Narrative    Review of Systems: General: Negative for anorexia, weight loss, fever, chills, fatigue, weakness. Eyes: Negative for vision changes.  ENT: Negative for hoarseness, difficulty swallowing. CV: Negative for chest pain, angina, palpitations, peripheral edema.  Respiratory: Negative for dyspnea at rest, cough,  sputum, wheezing.  GI: See history of present illness. Derm: Negative for rash or itching.  Endo: Negative for unusual weight change.  Heme: Negative for bruising or bleeding. Allergy: Negative for rash or hives.    Physical Exam: BP 130/70 mmHg  Pulse 94  Temp(Src) 98.3 F (36.8 C) (Oral)  Ht '5\' 8"'$  (1.727 m)  Wt 241 lb (109.317 kg)  BMI 36.65 kg/m2 General:   Obese female, alert and oriented. Pleasant and cooperative. Well-nourished and well-developed.  Head:  Normocephalic and atraumatic. Eyes:  Without icterus, sclera clear and conjunctiva pink.  Ears:  Normal auditory acuity. Cardiovascular:  S1, S2 present without murmurs appreciated. Extremities without clubbing or edema. Respiratory:  Clear to auscultation bilaterally. No wheezes,  rales, or rhonchi. No distress.  Gastrointestinal:  +BS, soft, non-tender and non-distended. No HSM noted. No guarding or rebound. No masses appreciated.  Rectal:  Deferred  Musculoskalatal:  Symmetrical without gross deformities. Neurologic:  Alert and oriented x4;  grossly normal neurologically. Psych:  Alert and cooperative. Normal mood and affect. Heme/Lymph/Immune: No excessive bruising noted.    10/01/2015 11:30 AM   Disclaimer: This note was dictated with voice recognition software. Similar sounding words can inadvertently be transcribed and may not be corrected upon review.

## 2015-10-01 NOTE — Assessment & Plan Note (Signed)
The patient has a history of adenomatous colon polyps in 2007 and 2012. She has been diligent about following up on her recommended 5 year colonoscopies. She is currently due. Generally asymptomatic from a GI standpoint other than chronic diarrhea since cholecystectomy in 2012. Diarrhea depends on her intake with high fat foods causing worsening symptoms. At this point we will arrange for her to have her due surveillance colonoscopy. Return for follow-up based on postprocedure recommendations.  Proceed with TCS with Dr. Gala Romney in near future: the risks, benefits, and alternatives have been discussed with the patient in detail. The patient states understanding and desires to proceed.  The patient is currently on Wellbutrin 100 mg twice a day, Neurontin 300 mg 3 times a day. No other anticoagulants, anxiolytics, chronic pain medications, or antidepressants. We will add 25 mg preprocedure Phenergan to promote adequate sedation.

## 2015-10-01 NOTE — Progress Notes (Signed)
CC'ED TO PCP 

## 2015-10-01 NOTE — Patient Instructions (Signed)
1. We will schedule your procedure for you. 2. Call us if you have any questions. 3. Return for follow-up based on recommendations made after your procedure.

## 2015-10-06 ENCOUNTER — Telehealth: Payer: Self-pay | Admitting: Internal Medicine

## 2015-10-06 NOTE — Telephone Encounter (Signed)
Pt called to cancel her procedure with RMR on Thursday and did not want to reschedule at this time

## 2015-10-06 NOTE — Telephone Encounter (Signed)
Noted and pt has been taken off the schedule 

## 2015-10-09 ENCOUNTER — Encounter (HOSPITAL_COMMUNITY): Admission: RE | Payer: Self-pay | Source: Ambulatory Visit

## 2015-10-09 ENCOUNTER — Ambulatory Visit (HOSPITAL_COMMUNITY): Admission: RE | Admit: 2015-10-09 | Payer: Self-pay | Source: Ambulatory Visit | Admitting: Internal Medicine

## 2015-10-09 SURGERY — COLONOSCOPY
Anesthesia: Moderate Sedation

## 2015-10-13 ENCOUNTER — Other Ambulatory Visit: Payer: Self-pay | Admitting: Internal Medicine

## 2015-10-27 ENCOUNTER — Ambulatory Visit: Payer: Self-pay | Attending: Internal Medicine

## 2015-10-27 MED FILL — FUROSEMIDE 20 MG TABLET: 20 | 30 days supply | Qty: 30 | Fill #0

## 2015-10-27 MED FILL — ?PANTOPRAZOLE SOD DR 40MG: 40 MG | 30 days supply | Qty: 30 | Fill #0

## 2015-10-27 MED FILL — METFORMIN HCL ER 500 MG TAB: 500 | 30 days supply | Qty: 120 | Fill #0

## 2015-12-03 MED FILL — $TOUJEO SOLOSTAR 300 UNIT/M: 300 | 54 days supply | Qty: 3 | Fill #3

## 2015-12-03 MED FILL — FUROSEMIDE 20 MG TABLET: 20 | 30 days supply | Qty: 30 | Fill #1

## 2015-12-03 MED FILL — ?PANTOPRAZOLE SOD DR 40MG: 40 MG | 30 days supply | Qty: 30 | Fill #1

## 2015-12-03 MED FILL — METFORMIN HCL ER 500 MG TAB: 500 | 30 days supply | Qty: 120 | Fill #1

## 2015-12-04 MED FILL — $TOUJEO SOLOSTAR 300 UNIT/M: 300 | 18 days supply | Qty: 6 | Fill #4

## 2015-12-12 ENCOUNTER — Encounter: Payer: Self-pay | Admitting: Family Medicine

## 2015-12-12 ENCOUNTER — Ambulatory Visit: Payer: Self-pay | Attending: Family Medicine | Admitting: Family Medicine

## 2015-12-12 VITALS — BP 112/71 | HR 80 | Temp 98.7°F | Ht 66.0 in | Wt 240.8 lb

## 2015-12-12 DIAGNOSIS — I2510 Atherosclerotic heart disease of native coronary artery without angina pectoris: Secondary | ICD-10-CM

## 2015-12-12 DIAGNOSIS — E1165 Type 2 diabetes mellitus with hyperglycemia: Secondary | ICD-10-CM | POA: Insufficient documentation

## 2015-12-12 DIAGNOSIS — E1141 Type 2 diabetes mellitus with diabetic mononeuropathy: Secondary | ICD-10-CM | POA: Insufficient documentation

## 2015-12-12 DIAGNOSIS — J320 Chronic maxillary sinusitis: Secondary | ICD-10-CM | POA: Insufficient documentation

## 2015-12-12 DIAGNOSIS — E118 Type 2 diabetes mellitus with unspecified complications: Secondary | ICD-10-CM

## 2015-12-12 DIAGNOSIS — Z794 Long term (current) use of insulin: Secondary | ICD-10-CM | POA: Insufficient documentation

## 2015-12-12 DIAGNOSIS — I2583 Coronary atherosclerosis due to lipid rich plaque: Secondary | ICD-10-CM

## 2015-12-12 DIAGNOSIS — Z8673 Personal history of transient ischemic attack (TIA), and cerebral infarction without residual deficits: Secondary | ICD-10-CM | POA: Insufficient documentation

## 2015-12-12 DIAGNOSIS — IMO0001 Reserved for inherently not codable concepts without codable children: Secondary | ICD-10-CM

## 2015-12-12 DIAGNOSIS — I10 Essential (primary) hypertension: Secondary | ICD-10-CM | POA: Insufficient documentation

## 2015-12-12 LAB — GLUCOSE, POCT (MANUAL RESULT ENTRY): POC Glucose: 161 mg/dl — AB (ref 70–99)

## 2015-12-12 LAB — POCT GLYCOSYLATED HEMOGLOBIN (HGB A1C): Hemoglobin A1C: 8.1

## 2015-12-12 MED ORDER — HYDROCHLOROTHIAZIDE 12.5 MG PO CAPS: 12.5000 mg | ORAL_CAPSULE | Freq: Every day | ORAL | 6 refills | Status: DC

## 2015-12-12 MED ORDER — CETIRIZINE HCL 10 MG PO TABS: 10.0000 mg | ORAL_TABLET | Freq: Every day | ORAL | 6 refills | Status: DC

## 2015-12-12 MED ORDER — INSULIN GLARGINE 300 UNIT/ML ~~LOC~~ SOPN: 30.0000 [IU] | PEN_INJECTOR | Freq: Every day | SUBCUTANEOUS | 6 refills | Status: DC

## 2015-12-12 MED ORDER — GABAPENTIN 300 MG PO CAPS: 300.0000 mg | ORAL_CAPSULE | Freq: Three times a day (TID) | ORAL | 6 refills | Status: DC

## 2015-12-12 MED ORDER — ATORVASTATIN CALCIUM 10 MG PO TABS: 10.0000 mg | ORAL_TABLET | Freq: Every day | ORAL | 6 refills | Status: DC

## 2015-12-12 MED ORDER — PANTOPRAZOLE SODIUM 40 MG PO TBEC: 40.0000 mg | DELAYED_RELEASE_TABLET | Freq: Every day | ORAL | 6 refills | Status: DC

## 2015-12-12 MED ORDER — METOPROLOL TARTRATE 50 MG PO TABS: 50.0000 mg | ORAL_TABLET | Freq: Two times a day (BID) | ORAL | 6 refills | Status: DC

## 2015-12-12 MED ORDER — METFORMIN HCL ER 500 MG PO TB24: ORAL_TABLET | ORAL | 6 refills | Status: DC

## 2015-12-12 MED ORDER — FUROSEMIDE 20 MG PO TABS: 20.0000 mg | ORAL_TABLET | Freq: Every day | ORAL | 6 refills | Status: DC

## 2015-12-12 NOTE — Progress Notes (Signed)
Subjective:  Patient ID: Deborah Coffey, female    DOB: [birth year]  Age: 60 y.o. MRN: [id number]  CC: Diabetes and Hypertension   HPI Deborah Coffey is a 60 year old female with a history of coronary artery disease, previous history of TIA, diabetes mellitus (A1c 8.1), diabetic neuropathy, hypertension who comes into the clinic for a follow-up visit.  She was recently seen by ophthalmology (Dr Katy Fitch) due to floaters in her left eye and underwent a laser procedure with improvement in symptoms. She is awaiting approval for insurance to undergo further procedures but denies any visual complaints.  She has been compliant with her diabetic medications but does not exercise much. Also tolerating her antihypertensives and statin.  Complains of 6-7 month history of cough productive of white phlegm, postnasal drip, sinus pain but no fever. Denies chest pain  Past Medical History:  Diagnosis Date  . Allergy   . Anemia   . CAD in native artery 02/2004   Mid LAD lesoin just after Large D1 --> CABG x 1 LIMA-LAD  . CAP (community acquired pneumonia) 08/07/2014  . Chronic bronchitis ([place])    "I think I get it q yr" (08/07/2014)  . Depression   . High cholesterol   . History of blood transfusion 2005   "related to OHS"   . Hypertension   . 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 competitive flow  . Type II diabetes mellitus ([place])     Past Surgical History:  Procedure Laterality Date  . CARDIAC CATHETERIZATION  2010   Previous LAD 95% lesion - now ~30-40%; patent LIMA with competitive flow  . CESAREAN SECTION  1977; 1989  . CHOLECYSTECTOMY  ~ 2012  . CORONARY ARTERY BYPASS GRAFT  02/2004   "CABG X1" (03/22/2013); LIMA-LAD  . TUBAL LIGATION  1989    Allergies  Allergen Reactions  . Ciprofloxacin Rash     Outpatient Medications Prior to Visit  Medication Sig Dispense Refill  . aspirin 325 MG tablet Take 325 mg by mouth daily.      Marland Kitchen lisinopril (PRINIVIL,ZESTRIL) 10 MG tablet TAKE 1 TABLET BY MOUTH DAILY 90 tablet 0  . Multiple Vitamin (MULTIVITAMIN WITH MINERALS) TABS tablet Take 1 tablet by mouth daily.    Marland Kitchen atorvastatin (LIPITOR) 10 MG tablet TAKE 1 TABLET BY MOUTH DAILY. 90 tablet 0  . furosemide (LASIX) 20 MG tablet TAKE 1 TABLET BY MOUTH DAILY. 30 tablet 2  . gabapentin (NEURONTIN) 300 MG capsule Take 1 capsule (300 mg total) by mouth 3 (three) times daily. 90 capsule 3  . hydrochlorothiazide (MICROZIDE) 12.5 MG capsule TAKE ONE CAPSULE BY MOUTH DAILY 90 capsule 0  . Insulin Glargine (TOUJEO SOLOSTAR) 300 UNIT/ML SOPN Inject 25 Units into the skin daily with breakfast. 9 mL 3  . metFORMIN (GLUCOPHAGE-XR) 500 MG 24 hr tablet TAKE 2 TABLETS BY MOUTH 2 TIMES DAILY WITH A MEAL 120 tablet 2  . metoprolol (LOPRESSOR) 50 MG tablet TAKE 1 TABLET BY MOUTH 2 TIMES DAILY 180 tablet 0  . pantoprazole (PROTONIX) 40 MG tablet Take 1 tablet (40 mg total) by mouth daily. 90 tablet 3  . nitroGLYCERIN (NITROSTAT) 0.4 MG SL tablet Place 1 tablet (0.4 mg total) under the tongue every 5 (five) minutes as needed for chest pain. (Patient not taking: Reported on 12/12/2015) 30 tablet 3  . phosphate laxative (FLEET) 2.7-7.2 GM/15ML solution Take 15 mLs by mouth once. (Patient not taking: Reported on 12/12/2015) 45  mL 0   No facility-administered medications prior to visit.     ROS Review of Systems  Constitutional: Negative for activity change, appetite change and fatigue.  HENT: Positive for postnasal drip and sinus pressure. Negative for congestion and sore throat.   Eyes: Negative for visual disturbance.  Respiratory: Positive for cough. Negative for chest tightness, shortness of breath and wheezing.   Cardiovascular: Negative for chest pain and palpitations.  Gastrointestinal: Negative for abdominal distention, abdominal pain and constipation.  Endocrine: Negative for polydipsia.  Genitourinary: Negative for dysuria and frequency.   Musculoskeletal: Negative for arthralgias and back pain.  Skin: Negative for rash.  Neurological: Negative for tremors, light-headedness and numbness.  Hematological: Does not bruise/bleed easily.  Psychiatric/Behavioral: Negative for agitation and behavioral problems.    Objective:  BP 112/71 (BP Location: Right Arm, Patient Position: Sitting, Cuff Size: Large)   Pulse 80   Temp 98.7 F (37.1 C) (Oral)   Ht 5\' 6"  (1.676 m)   Wt 240 lb 12.8 oz (109.2 kg)   SpO2 100%   BMI 38.87 kg/m   BP/Weight 12/12/2015 10/01/2015 [id number]  Systolic BP [id number]  Diastolic BP 71 70 77  Wt. (Lbs) 240.8 241 244  BMI 38.87 36.65 39.4      Physical Exam  Constitutional: She is oriented to person, place, and time. She appears well-developed and well-nourished.  Obese  Cardiovascular: Normal rate, normal heart sounds and intact distal pulses.   No murmur heard. Pulmonary/Chest: Effort normal and breath sounds normal. She has no wheezes. She has no rales. She exhibits no tenderness.  Abdominal: Soft. Bowel sounds are normal. She exhibits no distension and no mass. There is no tenderness.  Musculoskeletal: Normal range of motion.  Neurological: She is alert and oriented to person, place, and time.  Skin: Skin is warm and dry.  Psychiatric: She has a normal mood and affect.     Lab Results  Component Value Date   HGBA1C 8.1 12/12/2015    CMP Latest Ref Rng & Units 06/15/2015 02/27/2015 08/09/2014  Glucose 65 - 99 mg/dL 101(H) 227(H) 186(H)  BUN 6 - 20 mg/dL 15 18 15   Creatinine 0.44 - 1.00 mg/dL 0.84 1.02 0.79  Sodium 135 - 145 mmol/L 140 138 139  Potassium 3.5 - 5.1 mmol/L 3.9 5.9(H) 4.5  Chloride 101 - 111 mmol/L 107 103 110  CO2 22 - 32 mmol/L 24 26 22   Calcium 8.9 - 10.3 mg/dL 9.2 9.7 8.3(L)  Total Protein 6.5 - 8.1 g/dL 7.2 7.0 -  Total Bilirubin 0.3 - 1.2 mg/dL 0.3 0.2 -  Alkaline Phos 38 - 126 U/L 71 98 -  AST 15 - 41 U/L 18 12 -  ALT 14 - 54 U/L 12(L) 9 -    Lipid Panel      Component Value Date/Time   CHOL 132 02/27/2015 1236   TRIG 128 02/27/2015 1236   HDL 67 02/27/2015 1236   CHOLHDL 2.0 02/27/2015 1236   VLDL 26 02/27/2015 1236   LDLCALC 39 02/27/2015 1236     Assessment & Plan:   1. Uncontrolled diabetes mellitus type 2 without complications, unspecified long term insulin use status ([place]) Uncontrolled with A1c of 8.1 Increased dose of Tujeo from 25 to 30 units Diabetic diet, lifestyle modifications - Glucose (CBG) - HgB A1c  2. Type 2 diabetes mellitus with complication, with long-term current use of insulin ([facility]) - gabapentin (NEURONTIN) 300 MG capsule; Take 1 capsule (300 mg total) by mouth 3 (three)  times daily.  Dispense: 90 capsule; Refill: 6 - metFORMIN (GLUCOPHAGE-XR) 500 MG 24 hr tablet; TAKE 2 TABLETS BY MOUTH 2 TIMES DAILY WITH A MEAL  Dispense: 120 tablet; Refill: 6 - Insulin Glargine (TOUJEO SOLOSTAR) 300 UNIT/ML SOPN; Inject 30 Units into the skin daily with breakfast.  Dispense: 9 mL; Refill: 6  3. Diabetic mononeuropathy associated with type 2 diabetes mellitus ([facility]) Stable - gabapentin (NEURONTIN) 300 MG capsule; Take 1 capsule (300 mg total) by mouth 3 (three) times daily.  Dispense: 90 capsule; Refill: 6  4. Essential hypertension Controlled - metoprolol (LOPRESSOR) 50 MG tablet; Take 1 tablet (50 mg total) by mouth 2 (two) times daily.  Dispense: 60 tablet; Refill: 6 - pantoprazole (PROTONIX) 40 MG tablet; Take 1 tablet (40 mg total) by mouth daily.  Dispense: 90 tablet; Refill: 6  5. H/O TIA (transient ischemic attack) and stroke Continue statin Risk factor modification  6. Coronary artery disease due to lipid rich plaque No angina at this time  7. Chronic maxillary sinusitis This could explain cough and postnasal drip - cetirizine (ZYRTEC) 10 MG tablet; Take 1 tablet (10 mg total) by mouth daily.  Dispense: 30 tablet; Refill: 6   Meds ordered this encounter  Medications  . atorvastatin (LIPITOR) 10 MG tablet     Sig: Take 1 tablet (10 mg total) by mouth daily.    Dispense:  30 tablet    Refill:  6  . furosemide (LASIX) 20 MG tablet    Sig: Take 1 tablet (20 mg total) by mouth daily.    Dispense:  30 tablet    Refill:  6  . gabapentin (NEURONTIN) 300 MG capsule    Sig: Take 1 capsule (300 mg total) by mouth 3 (three) times daily.    Dispense:  90 capsule    Refill:  6  . hydrochlorothiazide (MICROZIDE) 12.5 MG capsule    Sig: Take 1 capsule (12.5 mg total) by mouth daily.    Dispense:  30 capsule    Refill:  6  . metFORMIN (GLUCOPHAGE-XR) 500 MG 24 hr tablet    Sig: TAKE 2 TABLETS BY MOUTH 2 TIMES DAILY WITH A MEAL    Dispense:  120 tablet    Refill:  6  . metoprolol (LOPRESSOR) 50 MG tablet    Sig: Take 1 tablet (50 mg total) by mouth 2 (two) times daily.    Dispense:  60 tablet    Refill:  6  . pantoprazole (PROTONIX) 40 MG tablet    Sig: Take 1 tablet (40 mg total) by mouth daily.    Dispense:  90 tablet    Refill:  6  . Insulin Glargine (TOUJEO SOLOSTAR) 300 UNIT/ML SOPN    Sig: Inject 30 Units into the skin daily with breakfast.    Dispense:  9 mL    Refill:  6    Discontinue previous dose  . cetirizine (ZYRTEC) 10 MG tablet    Sig: Take 1 tablet (10 mg total) by mouth daily.    Dispense:  30 tablet    Refill:  6    Follow-up: Three-month follow-up on diabetes mellitus  Arnoldo Morale MD

## 2015-12-12 NOTE — Progress Notes (Signed)
congestion in chest and sinus's- productive cough clear/yellow mucus

## 2015-12-12 NOTE — Patient Instructions (Signed)
Diabetes Mellitus and Food It is important for you to manage your blood sugar (glucose) level. Your blood glucose level can be greatly affected by what you eat. Eating healthier foods in the appropriate amounts throughout the day at about the same time each day will help you control your blood glucose level. It can also help slow or prevent worsening of your diabetes mellitus. Healthy eating may even help you improve the level of your blood pressure and reach or maintain a healthy weight.  General recommendations for healthful eating and cooking habits include:  Eating meals and snacks regularly. Avoid going long periods of time without eating to lose weight.  Eating a diet that consists mainly of plant-based foods, such as fruits, vegetables, nuts, legumes, and whole grains.  Using low-heat cooking methods, such as baking, instead of high-heat cooking methods, such as deep frying. Work with your dietitian to make sure you understand how to use the Nutrition Facts information on food labels. HOW CAN FOOD AFFECT ME? Carbohydrates Carbohydrates affect your blood glucose level more than any other type of food. Your dietitian will help you determine how many carbohydrates to eat at each meal and teach you how to count carbohydrates. Counting carbohydrates is important to keep your blood glucose at a healthy level, especially if you are using insulin or taking certain medicines for diabetes mellitus. Alcohol Alcohol can cause sudden decreases in blood glucose (hypoglycemia), especially if you use insulin or take certain medicines for diabetes mellitus. Hypoglycemia can be a life-threatening condition. Symptoms of hypoglycemia (sleepiness, dizziness, and disorientation) are similar to symptoms of having too much alcohol.  If your health care provider has given you approval to drink alcohol, do so in moderation and use the following guidelines:  Women should not have more than one drink per day, and men  should not have more than two drinks per day. One drink is equal to:  12 oz of beer.  5 oz of wine.  1 oz of hard liquor.  Do not drink on an empty stomach.  Keep yourself hydrated. Have water, diet soda, or unsweetened iced tea.  Regular soda, juice, and other mixers might contain a lot of carbohydrates and should be counted. WHAT FOODS ARE NOT RECOMMENDED? As you make food choices, it is important to remember that all foods are not the same. Some foods have fewer nutrients per serving than other foods, even though they might have the same number of calories or carbohydrates. It is difficult to get your body what it needs when you eat foods with fewer nutrients. Examples of foods that you should avoid that are high in calories and carbohydrates but low in nutrients include:  Trans fats (most processed foods list trans fats on the Nutrition Facts label).  Regular soda.  Juice.  Candy.  Sweets, such as cake, pie, doughnuts, and cookies.  Fried foods. WHAT FOODS CAN I EAT? Eat nutrient-rich foods, which will nourish your body and keep you healthy. The food you should eat also will depend on several factors, including:  The calories you need.  The medicines you take.  Your weight.  Your blood glucose level.  Your blood pressure level.  Your cholesterol level. You should eat a variety of foods, including:  Protein.  Lean cuts of meat.  Proteins low in saturated fats, such as fish, egg whites, and beans. Avoid processed meats.  Fruits and vegetables.  Fruits and vegetables that may help control blood glucose levels, such as apples, mangoes, and   yams.  Dairy products.  Choose fat-free or low-fat dairy products, such as milk, yogurt, and cheese.  Grains, bread, pasta, and rice.  Choose whole grain products, such as multigrain bread, whole oats, and brown rice. These foods may help control blood pressure.  Fats.  Foods containing healthful fats, such as nuts,  avocado, olive oil, canola oil, and fish. DOES EVERYONE WITH DIABETES MELLITUS HAVE THE SAME MEAL PLAN? Because every person with diabetes mellitus is different, there is not one meal plan that works for everyone. It is very important that you meet with a dietitian who will help you create a meal plan that is just right for you.   This information is not intended to replace advice given to you by your health care provider. Make sure you discuss any questions you have with your health care provider.   Document Released: 12/31/2004 Document Revised: 04/26/2014 Document Reviewed: 03/02/2013 Elsevier Interactive Patient Education 2016 [employer]  

## 2015-12-24 MED FILL — LISINOPRIL 10 MG TABLET: 10 | 90 days supply | Qty: 90 | Fill #1

## 2015-12-24 MED FILL — $TOUJEO SOLOSTAR 300 UNIT/M: 300 | 18 days supply | Qty: 9 | Fill #0

## 2015-12-24 MED FILL — GABAPENTIN 300 MG CAPSULE: 300 | 30 days supply | Qty: 90 | Fill #0

## 2015-12-24 MED FILL — HYDROCHLOROTHIAZIDE 12.5 MG: 12.5 | 90 days supply | Qty: 90 | Fill #1

## 2015-12-24 MED FILL — ?CETIRIZINE HCL 10 MG TABLE: 10 | 30 days supply | Qty: 30 | Fill #0

## 2015-12-24 MED FILL — METOPROLOL TARTRATE 50 MG T: 50 | 30 days supply | Qty: 60 | Fill #0

## 2015-12-24 MED FILL — ?ATORVASTATIN 10 MG TABLET: 10 | 30 days supply | Qty: 30 | Fill #0

## 2016-01-19 MED FILL — METFORMIN HCL ER 500 MG TAB: 500 | 30 days supply | Qty: 120 | Fill #2

## 2016-01-19 MED FILL — FUROSEMIDE 20 MG TABLET: 20 | 30 days supply | Qty: 30 | Fill #2

## 2016-01-29 ENCOUNTER — Encounter: Payer: Self-pay | Admitting: Internal Medicine

## 2016-01-29 ENCOUNTER — Ambulatory Visit: Payer: Self-pay | Attending: Internal Medicine | Admitting: Internal Medicine

## 2016-01-29 VITALS — BP 131/79 | HR 88 | Temp 98.4°F | Resp 18 | Ht 68.0 in | Wt 247.0 lb

## 2016-01-29 DIAGNOSIS — Z951 Presence of aortocoronary bypass graft: Secondary | ICD-10-CM | POA: Insufficient documentation

## 2016-01-29 DIAGNOSIS — I10 Essential (primary) hypertension: Secondary | ICD-10-CM | POA: Insufficient documentation

## 2016-01-29 DIAGNOSIS — E1140 Type 2 diabetes mellitus with diabetic neuropathy, unspecified: Secondary | ICD-10-CM | POA: Insufficient documentation

## 2016-01-29 DIAGNOSIS — L308 Other specified dermatitis: Secondary | ICD-10-CM | POA: Insufficient documentation

## 2016-01-29 DIAGNOSIS — Z1211 Encounter for screening for malignant neoplasm of colon: Secondary | ICD-10-CM

## 2016-01-29 DIAGNOSIS — L309 Dermatitis, unspecified: Secondary | ICD-10-CM | POA: Insufficient documentation

## 2016-01-29 DIAGNOSIS — Z7982 Long term (current) use of aspirin: Secondary | ICD-10-CM | POA: Insufficient documentation

## 2016-01-29 DIAGNOSIS — E7800 Pure hypercholesterolemia, unspecified: Secondary | ICD-10-CM | POA: Insufficient documentation

## 2016-01-29 DIAGNOSIS — F17200 Nicotine dependence, unspecified, uncomplicated: Secondary | ICD-10-CM

## 2016-01-29 DIAGNOSIS — Z794 Long term (current) use of insulin: Secondary | ICD-10-CM | POA: Insufficient documentation

## 2016-01-29 DIAGNOSIS — Z8673 Personal history of transient ischemic attack (TIA), and cerebral infarction without residual deficits: Secondary | ICD-10-CM | POA: Insufficient documentation

## 2016-01-29 DIAGNOSIS — I2510 Atherosclerotic heart disease of native coronary artery without angina pectoris: Secondary | ICD-10-CM | POA: Insufficient documentation

## 2016-01-29 DIAGNOSIS — F17210 Nicotine dependence, cigarettes, uncomplicated: Secondary | ICD-10-CM | POA: Insufficient documentation

## 2016-01-29 DIAGNOSIS — E118 Type 2 diabetes mellitus with unspecified complications: Secondary | ICD-10-CM

## 2016-01-29 DIAGNOSIS — Z79899 Other long term (current) drug therapy: Secondary | ICD-10-CM | POA: Insufficient documentation

## 2016-01-29 DIAGNOSIS — Z881 Allergy status to other antibiotic agents status: Secondary | ICD-10-CM | POA: Insufficient documentation

## 2016-01-29 LAB — GLUCOSE, POCT (MANUAL RESULT ENTRY): POC Glucose: 244 mg/dl — AB (ref 70–99)

## 2016-01-29 MED ORDER — TRIAMCINOLONE ACETONIDE 0.5 % EX OINT: 1.0000 "application " | TOPICAL_OINTMENT | Freq: Two times a day (BID) | CUTANEOUS | 1 refills | Status: DC

## 2016-01-29 MED FILL — TRIAMCINOLONE 0.5% OINTMENT: 0.5 | 30 days supply | Qty: 30 | Fill #0

## 2016-01-29 NOTE — Progress Notes (Signed)
Patient is here for RASH on legs  Patient has taken medication today and patient has eaten today.

## 2016-01-29 NOTE — Progress Notes (Signed)
Deborah Coffey, is a 60 y.o. female  [id number]  [id number]  DOB - 1956-01-29  Chief Complaint  Patient presents with  . Rash       Subjective:   Deborah Coffey is a 60 y.o. female with a history of coronary artery disease, previous history of TIA, diabetes mellitus (A1c 8.1), diabetic neuropathy, hypertension who comes into the clinic for a follow-up visit. She was recently seen by ophthalmology (Dr Katy Fitch) due to floaters in her left eye and underwent a laser procedure with improvement in symptoms. She is awaiting approval for insurance to undergo further procedures but denies any visual complaints. Her major complaint today is a rash on her legs which started recently, itchy. No redness, no rash anywhere else. No fever, no night sweat. She takes her medications regularly, she reports no hypoglycemic event. No new medication recently added, no change in soap or body lotion. Patient has No headache, No chest pain, No abdominal pain - No Nausea, No new weakness tingling or numbness, No Cough - SOB. Patient has not had colonoscopy. She continues to smoke but cutting down on the numbers of cigarette per day.   Problem  Eczema    ALLERGIES: Allergies  Allergen Reactions  . Ciprofloxacin Rash    PAST MEDICAL HISTORY: Past Medical History:  Diagnosis Date  . Allergy   . Anemia   . CAD in native artery 02/2004   Mid LAD lesoin just after Large D1 --> CABG x 1 LIMA-LAD  . CAP (community acquired pneumonia) 08/07/2014  . Chronic bronchitis ([place])    "I think I get it q yr" (08/07/2014)  . Depression   . High cholesterol   . History of blood transfusion 2005   "related to [facility]"   . Hypertension   . 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 competitive flow  . Type II diabetes mellitus ([place])     MEDICATIONS AT HOME: Prior to Admission medications   Medication Sig Start Date End Date Taking? Authorizing Provider  aspirin  325 MG tablet Take 325 mg by mouth daily.   Yes Historical Provider, MD  atorvastatin (LIPITOR) 10 MG tablet Take 1 tablet (10 mg total) by mouth daily. 12/12/15  Yes Arnoldo Morale, MD  cetirizine (ZYRTEC) 10 MG tablet Take 1 tablet (10 mg total) by mouth daily. 12/12/15  Yes Arnoldo Morale, MD  furosemide (LASIX) 20 MG tablet Take 1 tablet (20 mg total) by mouth daily. 12/12/15  Yes Arnoldo Morale, MD  gabapentin (NEURONTIN) 300 MG capsule Take 1 capsule (300 mg total) by mouth 3 (three) times daily. 12/12/15  Yes Arnoldo Morale, MD  hydrochlorothiazide (MICROZIDE) 12.5 MG capsule Take 1 capsule (12.5 mg total) by mouth daily. 12/12/15  Yes Arnoldo Morale, MD  Insulin Glargine (TOUJEO SOLOSTAR) 300 UNIT/ML SOPN Inject 30 Units into the skin daily with breakfast. 12/12/15  Yes Arnoldo Morale, MD  lisinopril (PRINIVIL,ZESTRIL) 10 MG tablet TAKE 1 TABLET BY MOUTH DAILY 09/22/15  Yes Tresa Garter, MD  metFORMIN (GLUCOPHAGE-XR) 500 MG 24 hr tablet TAKE 2 TABLETS BY MOUTH 2 TIMES DAILY WITH A MEAL 12/12/15  Yes Arnoldo Morale, MD  metoprolol (LOPRESSOR) 50 MG tablet Take 1 tablet (50 mg total) by mouth 2 (two) times daily. 12/12/15  Yes Arnoldo Morale, MD  Multiple Vitamin (MULTIVITAMIN WITH MINERALS) TABS tablet Take 1 tablet by mouth daily.   Yes Historical Provider, MD  nitroGLYCERIN (NITROSTAT) 0.4 MG SL tablet Place 1  tablet (0.4 mg total) under the tongue every 5 (five) minutes as needed for chest pain. 06/26/15  Yes Tresa Garter, MD  pantoprazole (PROTONIX) 40 MG tablet Take 1 tablet (40 mg total) by mouth daily. 12/12/15  Yes Arnoldo Morale, MD  triamcinolone ointment (KENALOG) 0.5 % Apply 1 application topically 2 (two) times daily. 01/29/16   Tresa Garter, MD    Objective:   Vitals:   01/29/16 1621  BP: 131/79  Pulse: 88  Resp: 18  Temp: 98.4 F (36.9 C)  TempSrc: Oral  SpO2: 99%  Weight: 247 lb (112 kg)  Height: 5\' 8"  (1.727 m)   Exam General appearance : Awake, alert, not in any  distress. Speech Clear. Not toxic looking HEENT: Atraumatic and Normocephalic, pupils equally reactive to light and accomodation Neck: Supple, no JVD. No cervical lymphadenopathy.  Chest: Good air entry bilaterally, no added sounds  CVS: S1 S2 regular, no murmurs.  Abdomen: Bowel sounds present, Non tender and not distended with no gaurding, rigidity or rebound. Extremities: B/L Lower Ext shows no edema, both legs are warm to touch Neurology: Awake alert, and oriented X 3, CN II-XII intact, Non focal Skin: Eczematous rashes both lower limbs   Data Review Lab Results  Component Value Date   HGBA1C 8.1 12/12/2015   HGBA1C 8.2 06/26/2015   HGBA1C 10.10 02/27/2015    Assessment & Plan   1. Essential hypertension  We have discussed target BP range and blood pressure goal. I have advised patient to check BP regularly and to call us back or report to clinic if the numbers are consistently higher than 140/90. We discussed the importance of compliance with medical therapy and DASH diet recommended, consequences of uncontrolled hypertension discussed.  - continue current BP medications  2. Smoking addiction   Deborah Coffey was counseled on the dangers of tobacco use, and was advised to quit. Reviewed strategies to maximize success, including removing cigarettes and smoking materials from environment, stress management and support of family/friends.  3. Other eczema  - triamcinolone ointment (KENALOG) 0.5 %; Apply 1 application topically 2 (two) times daily.  Dispense: 30 g; Refill: 1  4. Colon cancer screening  - Ambulatory referral to Gastroenterology  5. Type 2 diabetes mellitus with complication, with long-term current use of insulin (HCC)  - Glucose (CBG)  Aim for 30 minutes of exercise most days. Rethink what you drink. Water is great! Aim for 2-3 Carb Choices per meal (30-45 grams) +/- 1 either way  Aim for 0-15 Carbs per snack if hungry  Include protein in moderation with your  meals and snacks  Consider reading food labels for Total Carbohydrate and Fat Grams of foods  Consider checking BG at alternate times per day  Continue taking medication as directed Be mindful about how much sugar you are adding to beverages and other foods. Fruit Punch - find one with no sugar  Measure and decrease portions of carbohydrate foods  Make your plate and don't go back for seconds  Patient have been counseled extensively about nutrition and exercise. Other issues discussed during this visit include: low cholesterol diet, weight control and daily exercise, foot care, annual eye examinations at Ophthalmology, importance of adherence with medications and regular follow-up. We also discussed long term complications of uncontrolled diabetes and hypertension.   Return in about 3 months (around 04/30/2016) for Follow up HTN, Hemoglobin A1C and Follow up, DM.  The patient was given clear instructions to go to ER or return to medical  center if symptoms don't improve, worsen or new problems develop. The patient verbalized understanding. The patient was told to call to get lab results if they haven't heard anything in the next week.   This note has been created with Surveyor, quantity. Any transcriptional errors are unintentional.    Angelica Chessman, MD, [place], [place], [place], [place] and [place], [place]   01/29/2016, 4:44 PM

## 2016-01-29 NOTE — Patient Instructions (Addendum)
Diabetes and Foot Care Diabetes may cause you to have problems because of poor blood supply (circulation) to your feet and legs. This may cause the skin on your feet to become thinner, break easier, and heal more slowly. Your skin may become dry, and the skin may peel and crack. You may also have nerve damage in your legs and feet causing decreased feeling in them. You may not notice minor injuries to your feet that could lead to infections or more serious problems. Taking care of your feet is one of the most important things you can do for yourself.  HOME CARE INSTRUCTIONS  Wear shoes at all times, even in the house. Do not go barefoot. Bare feet are easily injured.  Check your feet daily for blisters, cuts, and redness. If you cannot see the bottom of your feet, use a mirror or ask someone for help.  Wash your feet with warm water (do not use hot water) and mild soap. Then pat your feet and the areas between your toes until they are completely dry. Do not soak your feet as this can dry your skin.  Apply a moisturizing lotion or petroleum jelly (that does not contain alcohol and is unscented) to the skin on your feet and to dry, brittle toenails. Do not apply lotion between your toes.  Trim your toenails straight across. Do not dig under them or around the cuticle. File the edges of your nails with an emery board or nail file.  Do not cut corns or calluses or try to remove them with medicine.  Wear clean socks or stockings every day. Make sure they are not too tight. Do not wear knee-high stockings since they may decrease blood flow to your legs.  Wear shoes that fit properly and have enough cushioning. To break in new shoes, wear them for just a few hours a day. This prevents you from injuring your feet. Always look in your shoes before you put them on to be sure there are no objects inside.  Do not cross your legs. This may decrease the blood flow to your feet.  If you find a minor scrape,  cut, or break in the skin on your feet, keep it and the skin around it clean and dry. These areas may be cleansed with mild soap and water. Do not cleanse the area with peroxide, alcohol, or iodine.  When you remove an adhesive bandage, be sure not to damage the skin around it.  If you have a wound, look at it several times a day to make sure it is healing.  Do not use heating pads or hot water bottles. They may burn your skin. If you have lost feeling in your feet or legs, you may not know it is happening until it is too late.  Make sure your health care provider performs a complete foot exam at least annually or more often if you have foot problems. Report any cuts, sores, or bruises to your health care provider immediately. SEEK MEDICAL CARE IF:   You have an injury that is not healing.  You have cuts or breaks in the skin.  You have an ingrown nail.  You notice redness on your legs or feet.  You feel burning or tingling in your legs or feet.  You have pain or cramps in your legs and feet.  Your legs or feet are numb.  Your feet always feel cold. SEEK IMMEDIATE MEDICAL CARE IF:   There is increasing redness,   swelling, or pain in or around a wound.  There is a red line that goes up your leg.  Pus is coming from a wound.  You develop a fever or as directed by your health care provider.  You notice a bad smell coming from an ulcer or wound.   This information is not intended to replace advice given to you by your health care provider. Make sure you discuss any questions you have with your health care provider.   Document Released: 04/02/2000 Document Revised: 12/06/2012 Document Reviewed: 09/12/2012 Elsevier Interactive Patient Education 2016 [employer] Basic Carbohydrate Counting for Diabetes Mellitus Carbohydrate counting is a method for keeping track of the amount of carbohydrates you eat. Eating carbohydrates naturally increases the level of sugar (glucose) in your  blood, so it is important for you to know the amount that is okay for you to have in every meal. Carbohydrate counting helps keep the level of glucose in your blood within normal limits. The amount of carbohydrates allowed is different for every person. A dietitian can help you calculate the amount that is right for you. Once you know the amount of carbohydrates you can have, you can count the carbohydrates in the foods you want to eat. Carbohydrates are found in the following foods:  Grains, such as breads and cereals.  Dried beans and soy products.  Starchy vegetables, such as potatoes, peas, and corn.  Fruit and fruit juices.  Milk and yogurt.  Sweets and snack foods, such as cake, cookies, candy, chips, soft drinks, and fruit drinks. CARBOHYDRATE COUNTING There are two ways to count the carbohydrates in your food. You can use either of the methods or a combination of both. Reading the "Nutrition Facts" on Packaged Food The "Nutrition Facts" is an area that is included on the labels of almost all packaged food and beverages in the [place]. It includes the serving size of that food or beverage and information about the nutrients in each serving of the food, including the grams (g) of carbohydrate per serving.  Decide the number of servings of this food or beverage that you will be able to eat or drink. Multiply that number of servings by the number of grams of carbohydrate that is listed on the label for that serving. The total will be the amount of carbohydrates you will be having when you eat or drink this food or beverage. Learning Standard Serving Sizes of Food When you eat food that is not packaged or does not include "Nutrition Facts" on the label, you need to measure the servings in order to count the amount of carbohydrates.A serving of most carbohydrate-rich foods contains about 15 g of carbohydrates. The following list includes serving sizes of carbohydrate-rich foods that  provide 15 g ofcarbohydrate per serving:   1 slice of bread (1 oz) or 1 six-inch tortilla.    of a hamburger bun or English muffin.  4-6 crackers.   cup unsweetened dry cereal.    cup hot cereal.   cup rice or pasta.    cup mashed potatoes or  of a large baked potato.  1 cup fresh fruit or one small piece of fruit.    cup canned or frozen fruit or fruit juice.  1 cup milk.   cup plain fat-free yogurt or yogurt sweetened with artificial sweeteners.   cup cooked dried beans or starchy vegetable, such as peas, corn, or potatoes.  Decide the number of standard-size servings that you will eat. Multiply that   number of servings by 15 (the grams of carbohydrates in that serving). For example, if you eat 2 cups of strawberries, you will have eaten 2 servings and 30 g of carbohydrates (2 servings x 15 g = 30 g). For foods such as soups and casseroles, in which more than one food is mixed in, you will need to count the carbohydrates in each food that is included. EXAMPLE OF CARBOHYDRATE COUNTING Sample Dinner  3 oz chicken breast.   cup of brown rice.   cup of corn.  1 cup milk.   1 cup strawberries with sugar-free whipped topping.  Carbohydrate Calculation Step 1: Identify the foods that contain carbohydrates:   Rice.   Corn.   Milk.   Strawberries. Step 2:Calculate the number of servings eaten of each:   2 servings of rice.   1 serving of corn.   1 serving of milk.   1 serving of strawberries. Step 3: Multiply each of those number of servings by 15 g:   2 servings of rice x 15 g = 30 g.   1 serving of corn x 15 g = 15 g.   1 serving of milk x 15 g = 15 g.   1 serving of strawberries x 15 g = 15 g. Step 4: Add together all of the amounts to find the total grams of carbohydrates eaten: 30 g + 15 g + 15 g + 15 g = 75 g.   This information is not intended to replace advice given to you by your health care provider. Make sure you  discuss any questions you have with your health care provider.   Document Released: 04/05/2005 Document Revised: 04/26/2014 Document Reviewed: 03/02/2013 Elsevier Interactive Patient Education [employer].

## 2016-02-11 ENCOUNTER — Telehealth: Payer: Self-pay | Admitting: Internal Medicine

## 2016-02-11 DIAGNOSIS — L308 Other specified dermatitis: Secondary | ICD-10-CM

## 2016-02-11 MED ORDER — TRIAMCINOLONE ACETONIDE 0.5 % EX OINT: 1.0000 "application " | TOPICAL_OINTMENT | Freq: Two times a day (BID) | CUTANEOUS | 1 refills | Status: DC

## 2016-02-11 NOTE — Telephone Encounter (Signed)
Records will be in records file folder.

## 2016-02-11 NOTE — Telephone Encounter (Signed)
Patients cream was refilled on 01/29/16 with one additional refill. Patient received another refill on today.

## 2016-02-11 NOTE — Telephone Encounter (Signed)
Patient is requesting a refill on cream used on legs. Do you approve this refill?

## 2016-02-11 NOTE — Telephone Encounter (Signed)
Yes

## 2016-02-11 NOTE — Telephone Encounter (Signed)
Patient states rash is improving.  Patient would like to know if she could get more cream to continue applying on to her rash  Please follow up

## 2016-02-18 ENCOUNTER — Ambulatory Visit: Payer: Self-pay | Attending: Family Medicine

## 2016-02-23 ENCOUNTER — Other Ambulatory Visit: Payer: Self-pay | Admitting: Internal Medicine

## 2016-02-23 MED FILL — $TOUJEO SOLOSTAR 300 UNIT/M: 300 | 18 days supply | Qty: 9 | Fill #1

## 2016-02-23 MED FILL — ?PANTOPRAZOLE SOD DR 40MG: 40 MG | 30 days supply | Qty: 30 | Fill #0

## 2016-02-23 MED FILL — ?ATORVASTATIN 10 MG TABLET: 10 | 30 days supply | Qty: 30 | Fill #1

## 2016-02-23 MED FILL — METFORMIN HCL ER 500 MG TAB: 500 | 30 days supply | Qty: 120 | Fill #3

## 2016-02-23 MED FILL — TRIAMCINOLONE 0.5% OINTMENT: 0.5 | 30 days supply | Qty: 30 | Fill #0

## 2016-02-23 MED FILL — FUROSEMIDE 20 MG TABLET: 20 | 30 days supply | Qty: 30 | Fill #0

## 2016-03-17 NOTE — Telephone Encounter (Signed)
Patient has not scheduled appointment. Records will be in "records reviewed" folder °

## 2016-04-26 ENCOUNTER — Other Ambulatory Visit: Payer: Self-pay | Admitting: Internal Medicine

## 2016-04-26 MED FILL — $TOUJEO SOLOSTAR 300 UNIT/M: 300 | 30 days supply | Qty: 2 | Fill #2

## 2016-04-26 MED FILL — ?LISINOPRIL 10 MG TABLET: 10 | 30 days supply | Qty: 30 | Fill #0

## 2016-04-26 MED FILL — ?ATORVASTATIN 10 MG TABLET: 10 | 30 days supply | Qty: 30 | Fill #2

## 2016-04-26 MED FILL — ?HYDROCHLOROTHIAZIDE 12.5 M: 12.5 | 30 days supply | Qty: 30 | Fill #0

## 2016-04-26 MED FILL — ?PANTOPRAZOLE SOD DR 40MG: 40 MG | 30 days supply | Qty: 30 | Fill #1

## 2016-04-26 MED FILL — METOPROLOL TARTRATE 50 MG T: 50 | 30 days supply | Qty: 60 | Fill #1

## 2016-04-26 MED FILL — FUROSEMIDE 20 MG TABLET: 20 | 30 days supply | Qty: 30 | Fill #1

## 2016-04-26 MED FILL — GABAPENTIN 300 MG CAPSULE: 300 | 30 days supply | Qty: 90 | Fill #1

## 2016-05-24 MED FILL — $TOUJEO SOLOSTAR 300 UNIT/M: 300 | 30 days supply | Qty: 2 | Fill #3

## 2016-05-26 MED FILL — ?ATORVASTATIN 10 MG TABLET: 10 | 30 days supply | Qty: 30 | Fill #3

## 2016-05-26 MED FILL — ?PANTOPRAZOLE SOD DR 40MG: 40 MG | 30 days supply | Qty: 30 | Fill #2

## 2016-05-26 MED FILL — TRIAMCINOLONE 0.5% OINTMENT: 0.5 | 30 days supply | Qty: 30 | Fill #1

## 2016-05-26 MED FILL — LISINOPRIL 10 MG TABLET: 10 | 30 days supply | Qty: 30 | Fill #1

## 2016-05-26 MED FILL — ?FUROSEMIDE 20 MG TABLET: 20 | 30 days supply | Qty: 30 | Fill #2

## 2016-05-26 MED FILL — ?HYDROCHLOROTHIAZIDE 12.5 M: 12.5 | 30 days supply | Qty: 30 | Fill #1

## 2016-05-26 MED FILL — METOPROLOL TARTRATE 50 MG T: 50 | 30 days supply | Qty: 60 | Fill #2

## 2016-06-15 MED FILL — !TOUJEO SOLOSTAR 300 UNITS/: 300/ML | 30 days supply | Qty: 2 | Fill #4

## 2016-06-25 DIAGNOSIS — H2513 Age-related nuclear cataract, bilateral: Secondary | ICD-10-CM | POA: Diagnosis not present

## 2016-06-25 DIAGNOSIS — H3522 Other non-diabetic proliferative retinopathy, left eye: Secondary | ICD-10-CM | POA: Diagnosis not present

## 2016-06-25 DIAGNOSIS — E113493 Type 2 diabetes mellitus with severe nonproliferative diabetic retinopathy without macular edema, bilateral: Secondary | ICD-10-CM | POA: Diagnosis not present

## 2016-07-07 ENCOUNTER — Other Ambulatory Visit: Payer: Self-pay | Admitting: Pharmacist

## 2016-07-07 DIAGNOSIS — E113391 Type 2 diabetes mellitus with moderate nonproliferative diabetic retinopathy without macular edema, right eye: Secondary | ICD-10-CM | POA: Diagnosis not present

## 2016-07-07 DIAGNOSIS — H25813 Combined forms of age-related cataract, bilateral: Secondary | ICD-10-CM | POA: Diagnosis not present

## 2016-07-07 DIAGNOSIS — Z794 Long term (current) use of insulin: Principal | ICD-10-CM

## 2016-07-07 DIAGNOSIS — H35033 Hypertensive retinopathy, bilateral: Secondary | ICD-10-CM | POA: Diagnosis not present

## 2016-07-07 DIAGNOSIS — E1141 Type 2 diabetes mellitus with diabetic mononeuropathy: Secondary | ICD-10-CM

## 2016-07-07 DIAGNOSIS — H3582 Retinal ischemia: Secondary | ICD-10-CM | POA: Diagnosis not present

## 2016-07-07 DIAGNOSIS — I10 Essential (primary) hypertension: Secondary | ICD-10-CM

## 2016-07-07 DIAGNOSIS — E118 Type 2 diabetes mellitus with unspecified complications: Secondary | ICD-10-CM

## 2016-07-07 DIAGNOSIS — E113592 Type 2 diabetes mellitus with proliferative diabetic retinopathy without macular edema, left eye: Secondary | ICD-10-CM | POA: Diagnosis not present

## 2016-07-07 MED ORDER — HYDROCHLOROTHIAZIDE 12.5 MG PO CAPS: 12.5000 mg | ORAL_CAPSULE | Freq: Every day | ORAL | 6 refills | Status: DC

## 2016-07-07 MED ORDER — ACCU-CHEK FASTCLIX LANCETS MISC: 5 refills | Status: DC

## 2016-07-07 MED ORDER — LISINOPRIL 10 MG PO TABS: 10.0000 mg | ORAL_TABLET | Freq: Every day | ORAL | 0 refills | Status: DC

## 2016-07-07 MED ORDER — METOPROLOL TARTRATE 50 MG PO TABS: 50.0000 mg | ORAL_TABLET | Freq: Two times a day (BID) | ORAL | 6 refills | Status: DC

## 2016-07-07 MED ORDER — PANTOPRAZOLE SODIUM 40 MG PO TBEC: 40.0000 mg | DELAYED_RELEASE_TABLET | Freq: Every day | ORAL | 0 refills | Status: DC

## 2016-07-07 MED ORDER — FUROSEMIDE 20 MG PO TABS: 20.0000 mg | ORAL_TABLET | Freq: Every day | ORAL | 0 refills | Status: DC

## 2016-07-07 MED ORDER — ATORVASTATIN CALCIUM 10 MG PO TABS: 10.0000 mg | ORAL_TABLET | Freq: Every day | ORAL | 0 refills | Status: DC

## 2016-07-07 MED ORDER — METFORMIN HCL ER 500 MG PO TB24: ORAL_TABLET | ORAL | 0 refills | Status: DC

## 2016-07-07 MED ORDER — GABAPENTIN 300 MG PO CAPS: 300.0000 mg | ORAL_CAPSULE | Freq: Three times a day (TID) | ORAL | 0 refills | Status: DC

## 2016-07-07 MED ORDER — ACCU-CHEK NANO SMARTVIEW W/DEVICE KIT: PACK | 0 refills | Status: DC

## 2016-07-07 MED ORDER — BD SWAB SINGLE USE REGULAR PADS: MEDICATED_PAD | 5 refills | Status: DC

## 2016-07-07 MED ORDER — GLUCOSE BLOOD VI STRP: ORAL_STRIP | 12 refills | Status: DC

## 2016-07-14 ENCOUNTER — Ambulatory Visit: Payer: Medicare HMO | Attending: Internal Medicine | Admitting: Internal Medicine

## 2016-07-14 DIAGNOSIS — E118 Type 2 diabetes mellitus with unspecified complications: Secondary | ICD-10-CM | POA: Insufficient documentation

## 2016-07-14 DIAGNOSIS — I10 Essential (primary) hypertension: Secondary | ICD-10-CM | POA: Diagnosis not present

## 2016-07-14 DIAGNOSIS — Z794 Long term (current) use of insulin: Secondary | ICD-10-CM | POA: Insufficient documentation

## 2016-07-14 DIAGNOSIS — E785 Hyperlipidemia, unspecified: Secondary | ICD-10-CM | POA: Insufficient documentation

## 2016-07-14 DIAGNOSIS — Z1211 Encounter for screening for malignant neoplasm of colon: Secondary | ICD-10-CM | POA: Diagnosis not present

## 2016-07-14 DIAGNOSIS — F17200 Nicotine dependence, unspecified, uncomplicated: Secondary | ICD-10-CM

## 2016-07-14 LAB — POCT GLYCOSYLATED HEMOGLOBIN (HGB A1C): Hemoglobin A1C: 13.3

## 2016-07-14 LAB — GLUCOSE, POCT (MANUAL RESULT ENTRY): POC Glucose: 230 mg/dl — AB (ref 70–99)

## 2016-07-14 MED ORDER — INSULIN GLARGINE 300 UNIT/ML ~~LOC~~ SOPN: 35.0000 [IU] | PEN_INJECTOR | Freq: Every day | SUBCUTANEOUS | 6 refills | Status: DC

## 2016-07-14 MED ORDER — CANAGLIFLOZIN 100 MG PO TABS: 100.0000 mg | ORAL_TABLET | Freq: Every day | ORAL | 3 refills | Status: DC

## 2016-07-14 MED FILL — TOUJEO SOLOSTAR 300 UNITS/M: 300 | 30 days supply | Qty: 3 | Fill #5

## 2016-07-14 NOTE — Patient Instructions (Addendum)
Steps to Quit Smoking Smoking tobacco can be bad for your health. It can also affect almost every organ in your body. Smoking puts you and people around you at risk for many serious long-lasting (chronic) diseases. Quitting smoking is hard, but it is one of the best things that you can do for your health. It is never too late to quit. What are the benefits of quitting smoking? When you quit smoking, you lower your risk for getting serious diseases and conditions. They can include:  Lung cancer or lung disease.  Heart disease.  Stroke.  Heart attack.  Not being able to have children (infertility).  Weak bones (osteoporosis) and broken bones (fractures). If you have coughing, wheezing, and shortness of breath, those symptoms may get better when you quit. You may also get sick less often. If you are pregnant, quitting smoking can help to lower your chances of having a baby of low birth weight. What can I do to help me quit smoking? Talk with your doctor about what can help you quit smoking. Some things you can do (strategies) include:  Quitting smoking totally, instead of slowly cutting back how much you smoke over a period of time.  Going to in-person counseling. You are more likely to quit if you go to many counseling sessions.  Using resources and support systems, such as:  Online chats with a counselor.  Phone quitlines.  Printed self-help materials.  Support groups or group counseling.  Text messaging programs.  Mobile phone apps or applications.  Taking medicines. Some of these medicines may have nicotine in them. If you are pregnant or breastfeeding, do not take any medicines to quit smoking unless your doctor says it is okay. Talk with your doctor about counseling or other things that can help you. Talk with your doctor about using more than one strategy at the same time, such as taking medicines while you are also going to in-person counseling. This can help make quitting  easier. What things can I do to make it easier to quit? Quitting smoking might feel very hard at first, but there is a lot that you can do to make it easier. Take these steps:  Talk to your family and friends. Ask them to support and encourage you.  Call phone quitlines, reach out to support groups, or work with a counselor.  Ask people who smoke to not smoke around you.  Avoid places that make you want (trigger) to smoke, such as:  Bars.  Parties.  Smoke-break areas at work.  Spend time with people who do not smoke.  Lower the stress in your life. Stress can make you want to smoke. Try these things to help your stress:  Getting regular exercise.  Deep-breathing exercises.  Yoga.  Meditating.  Doing a body scan. To do this, close your eyes, focus on one area of your body at a time from head to toe, and notice which parts of your body are tense. Try to relax the muscles in those areas.  Download or buy apps on your mobile phone or tablet that can help you stick to your quit plan. There are many free apps, such as QuitGuide from the CDC (Centers for Disease Control and Prevention). You can find more support from smokefree.gov and other websites. This information is not intended to replace advice given to you by your health care provider. Make sure you discuss any questions you have with your health care provider. Document Released: 01/30/2009 Document Revised: 12/02/2015 Document   Reviewed: 08/20/2014 Elsevier Interactive Patient Education  2017 [place]. Diabetes Mellitus and Exercise Exercising regularly is important for your overall health, especially when you have diabetes (diabetes mellitus). Exercising is not only about losing weight. It has many health benefits, such as increasing muscle strength and bone density and reducing body fat and stress. This leads to improved fitness, flexibility, and endurance, all of which result in better overall health. Exercise has  additional benefits for people with diabetes, including:  Reducing appetite.  Helping to lower and control blood glucose.  Lowering blood pressure.  Helping to control amounts of fatty substances (lipids) in the blood, such as cholesterol and triglycerides.  Helping the body to respond better to insulin (improving insulin sensitivity).  Reducing how much insulin the body needs.  Decreasing the risk for heart disease by:  Lowering cholesterol and triglyceride levels.  Increasing the levels of good cholesterol.  Lowering blood glucose levels. What is my activity plan? Your health care provider or certified diabetes educator can help you make a plan for the type and frequency of exercise (activity plan) that works for you. Make sure that you:  Do at least 150 minutes of moderate-intensity or vigorous-intensity exercise each week. This could be brisk walking, biking, or water aerobics.  Do stretching and strength exercises, such as yoga or weightlifting, at least 2 times a week.  Spread out your activity over at least 3 days of the week.  Get some form of physical activity every day.  Do not go more than 2 days in a row without some kind of physical activity.  Avoid being inactive for more than 90 minutes at a time. Take frequent breaks to walk or stretch.  Choose a type of exercise or activity that you enjoy, and set realistic goals.  Start slowly, and gradually increase the intensity of your exercise over time. What do I need to know about managing my diabetes?  Check your blood glucose before and after exercising.  If your blood glucose is higher than 240 mg/dL (13.3 mmol/L) before you exercise, check your urine for ketones. If you have ketones in your urine, do not exercise until your blood glucose returns to normal.  Know the symptoms of low blood glucose (hypoglycemia) and how to treat it. Your risk for hypoglycemia increases during and after exercise. Common symptoms  of hypoglycemia can include:  Hunger.  Anxiety.  Sweating and feeling clammy.  Confusion.  Dizziness or feeling light-headed.  Increased heart rate or palpitations.  Blurry vision.  Tingling or numbness around the mouth, lips, or tongue.  Tremors or shakes.  Irritability.  Keep a rapid-acting carbohydrate snack available before, during, and after exercise to help prevent or treat hypoglycemia.  Avoid injecting insulin into areas of the body that are going to be exercised. For example, avoid injecting insulin into:  The arms, when playing tennis.  The legs, when jogging.  Keep records of your exercise habits. Doing this can help you and your health care provider adjust your diabetes management plan as needed. Write down:  Food that you eat before and after you exercise.  Blood glucose levels before and after you exercise.  The type and amount of exercise you have done.  When your insulin is expected to peak, if you use insulin. Avoid exercising at times when your insulin is peaking.  When you start a new exercise or activity, work with your health care provider to make sure the activity is safe for you, and to adjust  your insulin, medicines, or food intake as needed.  Drink plenty of water while you exercise to prevent dehydration or heat stroke. Drink enough fluid to keep your urine clear or pale yellow. This information is not intended to replace advice given to you by your health care provider. Make sure you discuss any questions you have with your health care provider. Document Released: 06/26/2003 Document Revised: 10/24/2015 Document Reviewed: 09/15/2015 Elsevier Interactive Patient Education  2017 [place]. Blood Glucose Monitoring, Adult Monitoring your blood sugar (glucose) helps you manage your diabetes. It also helps you and your health care provider determine how well your diabetes management plan is working. Blood glucose monitoring involves checking  your blood glucose as often as directed, and keeping a record (log) of your results over time. Why should I monitor my blood glucose? Checking your blood glucose regularly can:  Help you understand how food, exercise, illnesses, and medicines affect your blood glucose.  Let you know what your blood glucose is at any time. You can quickly tell if you are having low blood glucose (hypoglycemia) or high blood glucose (hyperglycemia).  Help you and your health care provider adjust your medicines as needed. When should I check my blood glucose? Follow instructions from your health care provider about how often to check your blood glucose. This may depend on:  The type of diabetes you have.  How well-controlled your diabetes is.  Medicines you are taking. If you have type 1 diabetes:   Check your blood glucose at least 2 times a day.  Also check your blood glucose:  Before every insulin injection.  Before and after exercise.  Between meals.  2 hours after a meal.  Occasionally between 2:00 a.m. and 3:00 a.m., as directed.  Before potentially dangerous tasks, like driving or using heavy machinery.  At bedtime.  You may need to check your blood glucose more often, up to 6-10 times a day:  If you use an insulin pump.  If you need multiple daily injections (MDI).  If your diabetes is not well-controlled.  If you are ill.  If you have a history of severe hypoglycemia.  If you have a history of not knowing when your blood glucose is getting low (hypoglycemia unawareness). If you have type 2 diabetes:   If you take insulin or other diabetes medicines, check your blood glucose at least 2 times a day.  If you are on intensive insulin therapy, check your blood glucose at least 4 times a day. Occasionally, you may also need to check between 2:00 a.m. and 3:00 a.m., as directed.  Also check your blood glucose:  Before and after exercise.  Before potentially dangerous tasks,  like driving or using heavy machinery.  You may need to check your blood glucose more often if:  Your medicine is being adjusted.  Your diabetes is not well-controlled.  You are ill. What is a blood glucose log?  A blood glucose log is a record of your blood glucose readings. It helps you and your health care provider:  Look for patterns in your blood glucose over time.  Adjust your diabetes management plan as needed.  Every time you check your blood glucose, write down your result and notes about things that may be affecting your blood glucose, such as your diet and exercise for the day.  Most glucose meters store a record of glucose readings in the meter. Some meters allow you to download your records to a computer. How do I check  my blood glucose? Follow these steps to get accurate readings of your blood glucose: Supplies needed    Blood glucose meter.  Test strips for your meter. Each meter has its own strips. You must use the strips that come with your meter.  A needle to prick your finger (lancet). Do not use lancets more than once.  A device that holds the lancet (lancing device).  A journal or log book to write down your results. Procedure   Wash your hands with soap and water.  Prick the side of your finger (not the tip) with the lancet. Use a different finger each time.  Gently rub the finger until a small drop of blood appears.  Follow instructions that come with your meter for inserting the test strip, applying blood to the strip, and using your blood glucose meter.  Write down your result and any notes. Alternative testing sites   Some meters allow you to use areas of your body other than your finger (alternative sites) to test your blood.  If you think you may have hypoglycemia, or if you have hypoglycemia unawareness, do not use alternative sites. Use your finger instead.  Alternative sites may not be as accurate as the fingers, because blood flow is  slower in these areas. This means that the result you get may be delayed, and it may be different from the result that you would get from your finger.  The most common alternative sites are:  Forearm.  Thigh.  Palm of the hand. Additional tips   Always keep your supplies with you.  If you have questions or need help, all blood glucose meters have a 24-hour "hotline" number that you can call. You may also contact your health care provider.  After you use a few boxes of test strips, adjust (calibrate) your blood glucose meter by following instructions that came with your meter. This information is not intended to replace advice given to you by your health care provider. Make sure you discuss any questions you have with your health care provider. Document Released: 04/08/2003 Document Revised: 10/24/2015 Document Reviewed: 09/15/2015 Elsevier Interactive Patient Education  2017 [employer] Hypertension Hypertension, commonly called high blood pressure, is when the force of blood pumping through the arteries is too strong. The arteries are the blood vessels that carry blood from the heart throughout the body. Hypertension forces the heart to work harder to pump blood and may cause arteries to become narrow or stiff. Having untreated or uncontrolled hypertension can cause heart attacks, strokes, kidney disease, and other problems. A blood pressure reading consists of a higher number over a lower number. Ideally, your blood pressure should be below 120/80. The first ("top") number is called the systolic pressure. It is a measure of the pressure in your arteries as your heart beats. The second ("bottom") number is called the diastolic pressure. It is a measure of the pressure in your arteries as the heart relaxes. What are the causes? The cause of this condition is not known. What increases the risk? Some risk factors for high blood pressure are under your control. Others are not. Factors you  can change   Smoking.  Having type 2 diabetes mellitus, high cholesterol, or both.  Not getting enough exercise or physical activity.  Being overweight.  Having too much fat, sugar, calories, or salt (sodium) in your diet.  Drinking too much alcohol. Factors that are difficult or impossible to change   Having chronic kidney disease.  Having a  family history of high blood pressure.  Age. Risk increases with age.  Race. You may be at higher risk if you are African-American.  Gender. Men are at higher risk than women before age 79. After age 33, women are at higher risk than men.  Having obstructive sleep apnea.  Stress. What are the signs or symptoms? Extremely high blood pressure (hypertensive crisis) may cause:  Headache.  Anxiety.  Shortness of breath.  Nosebleed.  Nausea and vomiting.  Severe chest pain.  Jerky movements you cannot control (seizures). How is this diagnosed? This condition is diagnosed by measuring your blood pressure while you are seated, with your arm resting on a surface. The cuff of the blood pressure monitor will be placed directly against the skin of your upper arm at the level of your heart. It should be measured at least twice using the same arm. Certain conditions can cause a difference in blood pressure between your right and left arms. Certain factors can cause blood pressure readings to be lower or higher than normal (elevated) for a short period of time:  When your blood pressure is higher when you are in a health care provider's office than when you are at home, this is called white coat hypertension. Most people with this condition do not need medicines.  When your blood pressure is higher at home than when you are in a health care provider's office, this is called masked hypertension. Most people with this condition may need medicines to control blood pressure. If you have a high blood pressure reading during one visit or you have  normal blood pressure with other risk factors:  You may be asked to return on a different day to have your blood pressure checked again.  You may be asked to monitor your blood pressure at home for 1 week or longer. If you are diagnosed with hypertension, you may have other blood or imaging tests to help your health care provider understand your overall risk for other conditions. How is this treated? This condition is treated by making healthy lifestyle changes, such as eating healthy foods, exercising more, and reducing your alcohol intake. Your health care provider may prescribe medicine if lifestyle changes are not enough to get your blood pressure under control, and if:  Your systolic blood pressure is above 130.  Your diastolic blood pressure is above 80. Your personal target blood pressure may vary depending on your medical conditions, your age, and other factors. Follow these instructions at home: Eating and drinking   Eat a diet that is high in fiber and potassium, and low in sodium, added sugar, and fat. An example eating plan is called the DASH (Dietary Approaches to Stop Hypertension) diet. To eat this way:  Eat plenty of fresh fruits and vegetables. Try to fill half of your plate at each meal with fruits and vegetables.  Eat whole grains, such as whole wheat pasta, brown rice, or whole grain bread. Fill about one quarter of your plate with whole grains.  Eat or drink low-fat dairy products, such as skim milk or low-fat yogurt.  Avoid fatty cuts of meat, processed or cured meats, and poultry with skin. Fill about one quarter of your plate with lean proteins, such as fish, chicken without skin, beans, eggs, and tofu.  Avoid premade and processed foods. These tend to be higher in sodium, added sugar, and fat.  Reduce your daily sodium intake. Most people with hypertension should eat less than 1,500 mg of sodium  a day.  Limit alcohol intake to no more than 1 drink a day for  nonpregnant women and 2 drinks a day for men. One drink equals 12 oz of beer, 5 oz of wine, or 1 oz of hard liquor. Lifestyle   Work with your health care provider to maintain a healthy body weight or to lose weight. Ask what an ideal weight is for you.  Get at least 30 minutes of exercise that causes your heart to beat faster (aerobic exercise) most days of the week. Activities may include walking, swimming, or biking.  Include exercise to strengthen your muscles (resistance exercise), such as pilates or lifting weights, as part of your weekly exercise routine. Try to do these types of exercises for 30 minutes at least 3 days a week.  Do not use any products that contain nicotine or tobacco, such as cigarettes and e-cigarettes. If you need help quitting, ask your health care provider.  Monitor your blood pressure at home as told by your health care provider.  Keep all follow-up visits as told by your health care provider. This is important. Medicines   Take over-the-counter and prescription medicines only as told by your health care provider. Follow directions carefully. Blood pressure medicines must be taken as prescribed.  Do not skip doses of blood pressure medicine. Doing this puts you at risk for problems and can make the medicine less effective.  Ask your health care provider about side effects or reactions to medicines that you should watch for. Contact a health care provider if:  You think you are having a reaction to a medicine you are taking.  You have headaches that keep coming back (recurring).  You feel dizzy.  You have swelling in your ankles.  You have trouble with your vision. Get help right away if:  You develop a severe headache or confusion.  You have unusual weakness or numbness.  You feel faint.  You have severe pain in your chest or abdomen.  You vomit repeatedly.  You have trouble breathing. Summary  Hypertension is when the force of blood  pumping through your arteries is too strong. If this condition is not controlled, it may put you at risk for serious complications.  Your personal target blood pressure may vary depending on your medical conditions, your age, and other factors. For most people, a normal blood pressure is less than 120/80.  Hypertension is treated with lifestyle changes, medicines, or a combination of both. Lifestyle changes include weight loss, eating a healthy, low-sodium diet, exercising more, and limiting alcohol. This information is not intended to replace advice given to you by your health care provider. Make sure you discuss any questions you have with your health care provider. Document Released: 04/05/2005 Document Revised: 03/03/2016 Document Reviewed: 03/03/2016 Elsevier Interactive Patient Education  2017 [employer].

## 2016-07-14 NOTE — Progress Notes (Signed)
Deborah Coffey, is a 61 y.o. female  DPO:[id number]  ERX:[id number]  DOB - [birth year]     Subjective:   Deborah Coffey is a 61 y.o. female with a history of coronary artery disease, previous history of TIA, diabetes mellitus (A1c 8.1 as of 12/12/2015), diabetic neuropathy, hypertension here today for a follow up visit. Patient has net been adherent with medications and has shown indiscretion to diet and nutrition since the last follow up. She is on Insulin Lantus, 30 units QHS. Patient said "I expect my A1C to go up today because I have not done what I need to do". She however has no new complaint today. She denies depression. She continues to smoke cigarette despite repeated counseling. Patient has No headache, No chest pain, No abdominal pain - No Nausea, No new weakness tingling or numbness, No Cough - SOB. She is due for colonoscopy.   Problem  Screening for Colon Cancer    ALLERGIES: Allergies  Allergen Reactions  . Ciprofloxacin Rash    PAST MEDICAL HISTORY: Past Medical History:  Diagnosis Date  . Allergy   . Anemia   . CAD in native artery 02/2004   Mid LAD lesoin just after Large D1 --> CABG x 1 LIMA-LAD  . CAP (community acquired pneumonia) 08/07/2014  . Chronic bronchitis ([place])    "I think I get it q yr" (08/07/2014)  . Depression   . High cholesterol   . History of blood transfusion 2005   "related to OHS"   . Hypertension   . 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 competitive flow  . Type II diabetes mellitus ([place])     MEDICATIONS AT HOME: Prior to Admission medications   Medication Sig Start Date End Date Taking? Authorizing Provider  ACCU-CHEK FASTCLIX LANCETS MISC Use as directed to check blood glucose 3 times daily. E11.9 07/07/16   Tresa Garter, MD  Alcohol Swabs (B-D SINGLE USE SWABS REGULAR) PADS Use as directed 07/07/16   Tresa Garter, MD  aspirin 325 MG tablet Take 325 mg by mouth  daily.    Historical Provider, MD  atorvastatin (LIPITOR) 10 MG tablet Take 1 tablet (10 mg total) by mouth daily. 07/07/16   Tresa Garter, MD  Blood Glucose Monitoring Suppl (ACCU-CHEK NANO SMARTVIEW) w/Device KIT Use as directed to check blood glucose 3 times daily. E11.9 07/07/16   Tresa Garter, MD  canagliflozin (INVOKANA) 100 MG TABS tablet Take 1 tablet (100 mg total) by mouth daily before breakfast. 07/14/16   Tresa Garter, MD  cetirizine (ZYRTEC) 10 MG tablet Take 1 tablet (10 mg total) by mouth daily. 12/12/15   Arnoldo Morale, MD  furosemide (LASIX) 20 MG tablet Take 1 tablet (20 mg total) by mouth daily. 07/07/16   Tresa Garter, MD  gabapentin (NEURONTIN) 300 MG capsule Take 1 capsule (300 mg total) by mouth 3 (three) times daily. 07/07/16   Tresa Garter, MD  glucose blood (ACCU-CHEK SMARTVIEW) test strip Use as instructed to test blood glucose 3 times daily. E11.9 07/07/16   Tresa Garter, MD  hydrochlorothiazide (MICROZIDE) 12.5 MG capsule Take 1 capsule (12.5 mg total) by mouth daily. 07/07/16   Tresa Garter, MD  Insulin Glargine (TOUJEO SOLOSTAR) 300 UNIT/ML SOPN Inject 35 Units into the skin daily with breakfast. 07/14/16   Tresa Garter, MD  lisinopril (PRINIVIL,ZESTRIL) 10 MG tablet Take 1 tablet (10 mg total) by mouth  daily. 07/07/16   Quentin Angst, MD  metFORMIN (GLUCOPHAGE-XR) 500 MG 24 hr tablet TAKE 2 TABLETS BY MOUTH 2 TIMES DAILY WITH A MEAL 07/07/16   Quentin Angst, MD  metoprolol (LOPRESSOR) 50 MG tablet Take 1 tablet (50 mg total) by mouth 2 (two) times daily. 07/07/16   Quentin Angst, MD  Multiple Vitamin (MULTIVITAMIN WITH MINERALS) TABS tablet Take 1 tablet by mouth daily.    Historical Provider, MD  nitroGLYCERIN (NITROSTAT) 0.4 MG SL tablet Place 1 tablet (0.4 mg total) under the tongue every 5 (five) minutes as needed for chest pain. 06/26/15   Quentin Angst, MD  pantoprazole (PROTONIX) 40 MG tablet Take 1  tablet (40 mg total) by mouth daily. 07/07/16   Quentin Angst, MD  triamcinolone ointment (KENALOG) 0.5 % Apply 1 application topically 2 (two) times daily. 02/11/16   Quentin Angst, MD    Objective:  There were no vitals filed for this visit. Exam General appearance : Awake, alert, not in any distress. Speech Clear. Not toxic looking, obese HEENT: Atraumatic and Normocephalic, pupils equally reactive to light and accomodation Neck: Supple, no JVD. No cervical lymphadenopathy.  Chest: Good air entry bilaterally, no added sounds  CVS: S1 S2 regular, no murmurs.  Abdomen: Bowel sounds present, Non tender and not distended with no gaurding, rigidity or rebound. Extremities: B/L Lower Ext shows no edema, both legs are warm to touch Neurology: Awake alert, and oriented X 3, CN II-XII intact, Non focal Skin: No Rash  Data Review Lab Results  Component Value Date   HGBA1C 13.3 07/14/2016   HGBA1C 8.1 12/12/2015   HGBA1C 8.2 06/26/2015    Assessment & Plan   1. Type 2 diabetes mellitus with complication, with long-term current use of insulin (HCC)  - POCT A1C - Microalbumin/Creatinine Ratio, Urine - Glucose (CBG) -Increase insulin to 35 units from 30 units daily. Insulin Glargine (TOUJEO SOLOSTAR) 300 UNIT/ML SOPN; Inject 35 Units into the skin daily with breakfast.  Dispense: 9 mL; Refill: 6  Add Invokana to regimen.  - canagliflozin (INVOKANA) 100 MG TABS tablet; Take 1 tablet (100 mg total) by mouth daily before breakfast.  Dispense: 30 tablet; Refill: 3  - CMP14+EGFR - Urinalysis, Complete - VITAMIN D 25 Hydroxy (Vit-D Deficiency, Fractures) - US Carotid Duplex Bilateral; Future - Ambulatory referral to Podiatry  2. Essential hypertension  We have discussed target BP range and blood pressure goal. I have advised patient to check BP regularly and to call us back or report to clinic if the numbers are consistently higher than 140/90. We discussed the importance of  compliance with medical therapy and DASH diet recommended, consequences of uncontrolled hypertension discussed.  - continue current BP medications  3. Smoking addiction  Deborah Coffey was counseled on the dangers of tobacco use, and was advised to quit. Reviewed strategies to maximize success, including removing cigarettes and smoking materials from environment, stress management and support of family/friends.  4. Dyslipidemia  - Lipid panel - US Carotid Duplex Bilateral; Future  5. Screening for colon cancer  - Ambulatory referral to Gastroenterology  Patient have been counseled extensively about nutrition and exercise. Other issues discussed during this visit include: low cholesterol diet, weight control and daily exercise, foot care, annual eye examinations at Ophthalmology, importance of adherence with medications and regular follow-up. We also discussed long term complications of uncontrolled diabetes and hypertension.   Return in about 3 months (around 10/14/2016) for Hemoglobin A1C and Follow up, DM,  Follow up HTN.  The patient was given clear instructions to go to ER or return to medical center if symptoms don't improve, worsen or new problems develop. The patient verbalized understanding. The patient was told to call to get lab results if they haven't heard anything in the next week.   This note has been created with Surveyor, quantity. Any transcriptional errors are unintentional.    Angelica Chessman, MD, [place], Karilyn Cota, [place] and [facility] [place], [place]   07/14/2016, 10:17 AM

## 2016-07-14 NOTE — Progress Notes (Signed)
Patient is here for FU DM  Patient denies pain at this time.  Patient has not taken medication today. Patient has not eaten today.

## 2016-07-15 ENCOUNTER — Telehealth: Payer: Self-pay | Admitting: *Deleted

## 2016-07-15 ENCOUNTER — Other Ambulatory Visit: Payer: Self-pay | Admitting: Internal Medicine

## 2016-07-15 ENCOUNTER — Ambulatory Visit (HOSPITAL_COMMUNITY)
Admission: RE | Admit: 2016-07-15 | Discharge: 2016-07-15 | Disposition: A | Discharge status: Home / Self Care | Payer: Medicare HMO | Source: Ambulatory Visit | Attending: Internal Medicine | Admitting: Internal Medicine

## 2016-07-15 ENCOUNTER — Other Ambulatory Visit: Payer: Self-pay | Admitting: Pharmacist

## 2016-07-15 DIAGNOSIS — H538 Other visual disturbances: Secondary | ICD-10-CM | POA: Insufficient documentation

## 2016-07-15 DIAGNOSIS — Z794 Long term (current) use of insulin: Principal | ICD-10-CM

## 2016-07-15 DIAGNOSIS — I10 Essential (primary) hypertension: Secondary | ICD-10-CM | POA: Diagnosis not present

## 2016-07-15 DIAGNOSIS — E118 Type 2 diabetes mellitus with unspecified complications: Secondary | ICD-10-CM

## 2016-07-15 DIAGNOSIS — I6523 Occlusion and stenosis of bilateral carotid arteries: Secondary | ICD-10-CM | POA: Diagnosis not present

## 2016-07-15 LAB — CMP14+EGFR
ALT: 13 IU/L (ref 0–32)
AST: 16 IU/L (ref 0–40)
Albumin/Globulin Ratio: 1.3 (ref 1.2–2.2)
Albumin: 4.2 g/dL (ref 3.5–5.5)
Alkaline Phosphatase: 116 IU/L (ref 39–117)
BUN/Creatinine Ratio: 19 (ref 9–23)
BUN: 19 mg/dL (ref 6–24)
Bilirubin Total: <0.2 mg/dL (ref 0.0–1.2)
CALCIUM: 9.6 mg/dL (ref 8.7–10.2)
CO2: 24 mmol/L (ref 18–29)
Chloride: 97 mmol/L (ref 96–106)
Creatinine, Ser: 0.98 mg/dL (ref 0.57–1.00)
GFR calc Af Amer: 73 mL/min/{1.73_m2} (ref >59)
GFR, EST NON AFRICAN AMERICAN: 63 mL/min/{1.73_m2} (ref >59)
Globulin, Total: 3.3 g/dL (ref 1.5–4.5)
Glucose: 263 mg/dL — ABNORMAL HIGH (ref 65–99)
Potassium: 4.5 mmol/L (ref 3.5–5.2)
Sodium: 138 mmol/L (ref 134–144)
Total Protein: 7.5 g/dL (ref 6.0–8.5)

## 2016-07-15 LAB — VAS US CAROTID
LEFT ECA DIAS: -17 cm/s
LEFT VERTEBRAL DIAS: -16 cm/s
Left CCA dist dias: -24 cm/s
Left CCA dist sys: -90 cm/s
Left CCA prox dias: 26 cm/s
Left CCA prox sys: 120 cm/s
Left ICA dist dias: -25 cm/s
Left ICA dist sys: -73 cm/s
Left ICA prox dias: -20 cm/s
Left ICA prox sys: -82 cm/s
RIGHT ECA DIAS: -19 cm/s
RIGHT VERTEBRAL DIAS: -14 cm/s
Right CCA prox dias: 17 cm/s
Right CCA prox sys: 96 cm/s
Right cca dist sys: -101 cm/s

## 2016-07-15 LAB — MICROSCOPIC EXAMINATION

## 2016-07-15 LAB — LIPID PANEL
CHOL/HDL RATIO: 2.4 ratio (ref 0.0–4.4)
Cholesterol, Total: 167 mg/dL (ref 100–199)
HDL: 71 mg/dL (ref >39)
LDL Calculated: 77 mg/dL (ref 0–99)
TRIGLYCERIDES: 95 mg/dL (ref 0–149)
VLDL Cholesterol Cal: 19 mg/dL (ref 5–40)

## 2016-07-15 LAB — URINALYSIS, COMPLETE
Bilirubin, UA: NEGATIVE
Ketones, UA: NEGATIVE
Leukocytes, UA: NEGATIVE
NITRITE UA: NEGATIVE
RBC, UA: NEGATIVE
Specific Gravity, UA: 1.02 (ref 1.005–1.030)
UUROB: 0.2 mg/dL (ref 0.2–1.0)
pH, UA: 5.5 (ref 5.0–7.5)

## 2016-07-15 LAB — MICROALBUMIN / CREATININE URINE RATIO
Creatinine, Urine: 104.3 mg/dL
MICROALBUM., U, RANDOM: 123.8 ug/mL
Microalb/Creat Ratio: 118.7 mg/g creat — ABNORMAL HIGH (ref 0.0–30.0)

## 2016-07-15 LAB — VITAMIN D 25 HYDROXY (VIT D DEFICIENCY, FRACTURES): Vit D, 25-Hydroxy: 17.1 ng/mL — ABNORMAL LOW (ref 30.0–100.0)

## 2016-07-15 MED ORDER — FUROSEMIDE 20 MG PO TABS: 20.0000 mg | ORAL_TABLET | Freq: Every day | ORAL | 0 refills | Status: DC

## 2016-07-15 MED ORDER — ATORVASTATIN CALCIUM 10 MG PO TABS: 10.0000 mg | ORAL_TABLET | Freq: Every day | ORAL | 0 refills | Status: DC

## 2016-07-15 MED ORDER — VITAMIN D (ERGOCALCIFEROL) 1.25 MG (50000 UNIT) PO CAPS: 50000.0000 [IU] | ORAL_CAPSULE | ORAL | 1 refills | Status: DC

## 2016-07-15 MED ORDER — METOPROLOL TARTRATE 50 MG PO TABS: 50.0000 mg | ORAL_TABLET | Freq: Two times a day (BID) | ORAL | 6 refills | Status: DC

## 2016-07-15 MED ORDER — GLUCOSE BLOOD VI STRP: ORAL_STRIP | 12 refills | Status: DC

## 2016-07-15 MED ORDER — METFORMIN HCL ER 500 MG PO TB24: ORAL_TABLET | ORAL | 0 refills | Status: DC

## 2016-07-15 MED ORDER — HYDROCHLOROTHIAZIDE 12.5 MG PO CAPS: 12.5000 mg | ORAL_CAPSULE | Freq: Every day | ORAL | 6 refills | Status: DC

## 2016-07-15 NOTE — Telephone Encounter (Signed)
-----   Message from Tresa Garter, MD sent at 07/15/2016  2:17 PM EDT ----- Please inform patient that her lab results are mostly within normal limits except for low vitamin D. Vitamin D prescribed to the pharmacy.  Ultrasound of the neck blood vessels is normal.

## 2016-07-15 NOTE — Telephone Encounter (Signed)
Patient is aware of labs being normal except for vitamin d and a supplement was sent to the pharmacy. Patient is also aware of [place] being normal. Medical Assistant left message on patient's home and cell voicemail. Voicemail states to give a call back to [place] with [facility] at [phone].

## 2016-07-15 NOTE — Progress Notes (Signed)
**  Preliminary report by tech**  Carotid artery duplex complete. Findings are consistent with a 1-39 percent stenosis involving the right internal carotid artery and the left internal carotid artery. The vertebral arteries demonstrate antegrade flow.  07/15/16 10:41 AM Carlos Levering RVT

## 2016-08-05 ENCOUNTER — Telehealth: Payer: Self-pay | Admitting: Internal Medicine

## 2016-08-05 NOTE — Telephone Encounter (Signed)
Left patient a message informing her that her 7/3 appointment with provider was schedule incorrect and we need to reschedule it.

## 2016-08-09 DIAGNOSIS — H3582 Retinal ischemia: Secondary | ICD-10-CM | POA: Diagnosis not present

## 2016-08-09 DIAGNOSIS — E113391 Type 2 diabetes mellitus with moderate nonproliferative diabetic retinopathy without macular edema, right eye: Secondary | ICD-10-CM | POA: Diagnosis not present

## 2016-08-09 DIAGNOSIS — E113592 Type 2 diabetes mellitus with proliferative diabetic retinopathy without macular edema, left eye: Secondary | ICD-10-CM | POA: Diagnosis not present

## 2016-08-09 DIAGNOSIS — H35033 Hypertensive retinopathy, bilateral: Secondary | ICD-10-CM | POA: Diagnosis not present

## 2016-08-19 DIAGNOSIS — E113592 Type 2 diabetes mellitus with proliferative diabetic retinopathy without macular edema, left eye: Secondary | ICD-10-CM | POA: Diagnosis not present

## 2016-08-19 MED FILL — TOUJEO SOLOSTAR 300 UNITS/M: 300 | 30 days supply | Qty: 3 | Fill #6

## 2016-08-20 ENCOUNTER — Other Ambulatory Visit: Payer: Self-pay | Admitting: *Deleted

## 2016-08-20 DIAGNOSIS — I2583 Coronary atherosclerosis due to lipid rich plaque: Principal | ICD-10-CM

## 2016-08-20 DIAGNOSIS — I2510 Atherosclerotic heart disease of native coronary artery without angina pectoris: Secondary | ICD-10-CM

## 2016-08-20 NOTE — Progress Notes (Signed)
Referral for cardiology was placed with PCP's authorization.

## 2016-08-24 ENCOUNTER — Ambulatory Visit: Payer: Medicare HMO | Admitting: Podiatry

## 2016-09-01 ENCOUNTER — Encounter (HOSPITAL_COMMUNITY): Payer: Self-pay | Admitting: *Deleted

## 2016-09-01 ENCOUNTER — Emergency Department (HOSPITAL_COMMUNITY): Payer: Medicare HMO

## 2016-09-01 ENCOUNTER — Emergency Department (HOSPITAL_COMMUNITY)
Admission: EM | Admit: 2016-09-01 | Discharge: 2016-09-01 | Disposition: A | Discharge status: Home / Self Care | Payer: Medicare HMO | Attending: Emergency Medicine | Admitting: Emergency Medicine

## 2016-09-01 DIAGNOSIS — R079 Chest pain, unspecified: Secondary | ICD-10-CM | POA: Diagnosis not present

## 2016-09-01 DIAGNOSIS — Z5321 Procedure and treatment not carried out due to patient leaving prior to being seen by health care provider: Secondary | ICD-10-CM | POA: Insufficient documentation

## 2016-09-01 LAB — CBC
HCT: 32.6 % — ABNORMAL LOW (ref 36.0–46.0)
Hemoglobin: 10.2 g/dL — ABNORMAL LOW (ref 12.0–15.0)
MCH: 24.2 pg — ABNORMAL LOW (ref 26.0–34.0)
MCHC: 31.3 g/dL (ref 30.0–36.0)
MCV: 77.4 fL — ABNORMAL LOW (ref 78.0–100.0)
Platelets: 339 10*3/uL (ref 150–400)
RBC: 4.21 MIL/uL (ref 3.87–5.11)
RDW: 17 % — ABNORMAL HIGH (ref 11.5–15.5)
WBC: 8.4 10*3/uL (ref 4.0–10.5)

## 2016-09-01 LAB — BASIC METABOLIC PANEL
Anion gap: 8 (ref 5–15)
BUN: 21 mg/dL — ABNORMAL HIGH (ref 6–20)
CO2: 24 mmol/L (ref 22–32)
Calcium: 9.4 mg/dL (ref 8.9–10.3)
Chloride: 100 mmol/L — ABNORMAL LOW (ref 101–111)
Creatinine, Ser: 1.09 mg/dL — ABNORMAL HIGH (ref 0.44–1.00)
GFR calc Af Amer: >60 mL/min (ref >60)
GFR calc non Af Amer: 54 mL/min — ABNORMAL LOW (ref >60)
Glucose, Bld: 330 mg/dL — ABNORMAL HIGH (ref 65–99)
Potassium: 5.1 mmol/L (ref 3.5–5.1)
Sodium: 132 mmol/L — ABNORMAL LOW (ref 135–145)

## 2016-09-01 LAB — I-STAT TROPONIN, ED: Troponin i, poc: 0 ng/mL (ref 0.00–0.08)

## 2016-09-01 NOTE — ED Triage Notes (Signed)
Pt c/o L sided CP that radiates into L arm onset yesterday, pt reports hx of CABG, pt reports SOB, pt denies v/d, pt nausea, A&O x4

## 2016-09-01 NOTE — ED Notes (Signed)
Pt called x 2 for a room no answer

## 2016-09-01 NOTE — ED Notes (Signed)
Pt did not respond when called for room assignment; Iconnect text was also sent with no response

## 2016-09-01 NOTE — ED Notes (Signed)
Called pt in waiting room and checked in triage no answer. Pt returned to waiting room.

## 2016-09-02 ENCOUNTER — Encounter (HOSPITAL_COMMUNITY): Payer: Self-pay

## 2016-09-02 ENCOUNTER — Observation Stay (HOSPITAL_COMMUNITY)
Admission: EM | Admit: 2016-09-02 | Discharge: 2016-09-03 | Disposition: A | Discharge status: Home / Self Care | Payer: Medicare HMO | Attending: Family Medicine | Admitting: Family Medicine

## 2016-09-02 ENCOUNTER — Observation Stay (HOSPITAL_COMMUNITY): Payer: Medicare HMO

## 2016-09-02 ENCOUNTER — Emergency Department (HOSPITAL_COMMUNITY): Payer: Medicare HMO

## 2016-09-02 DIAGNOSIS — I5032 Chronic diastolic (congestive) heart failure: Secondary | ICD-10-CM

## 2016-09-02 DIAGNOSIS — E7800 Pure hypercholesterolemia, unspecified: Secondary | ICD-10-CM | POA: Diagnosis not present

## 2016-09-02 DIAGNOSIS — E1165 Type 2 diabetes mellitus with hyperglycemia: Secondary | ICD-10-CM | POA: Diagnosis present

## 2016-09-02 DIAGNOSIS — R0789 Other chest pain: Secondary | ICD-10-CM

## 2016-09-02 DIAGNOSIS — E1151 Type 2 diabetes mellitus with diabetic peripheral angiopathy without gangrene: Secondary | ICD-10-CM | POA: Diagnosis not present

## 2016-09-02 DIAGNOSIS — I2583 Coronary atherosclerosis due to lipid rich plaque: Secondary | ICD-10-CM

## 2016-09-02 DIAGNOSIS — R079 Chest pain, unspecified: Secondary | ICD-10-CM | POA: Diagnosis not present

## 2016-09-02 DIAGNOSIS — I2510 Atherosclerotic heart disease of native coronary artery without angina pectoris: Secondary | ICD-10-CM | POA: Diagnosis present

## 2016-09-02 DIAGNOSIS — E1110 Type 2 diabetes mellitus with ketoacidosis without coma: Secondary | ICD-10-CM | POA: Insufficient documentation

## 2016-09-02 DIAGNOSIS — N179 Acute kidney failure, unspecified: Secondary | ICD-10-CM | POA: Diagnosis not present

## 2016-09-02 DIAGNOSIS — Z794 Long term (current) use of insulin: Secondary | ICD-10-CM | POA: Insufficient documentation

## 2016-09-02 DIAGNOSIS — E1141 Type 2 diabetes mellitus with diabetic mononeuropathy: Secondary | ICD-10-CM | POA: Diagnosis present

## 2016-09-02 DIAGNOSIS — R531 Weakness: Secondary | ICD-10-CM | POA: Diagnosis not present

## 2016-09-02 DIAGNOSIS — F449 Dissociative and conversion disorder, unspecified: Secondary | ICD-10-CM | POA: Diagnosis not present

## 2016-09-02 DIAGNOSIS — D3500 Benign neoplasm of unspecified adrenal gland: Secondary | ICD-10-CM | POA: Diagnosis not present

## 2016-09-02 DIAGNOSIS — G459 Transient cerebral ischemic attack, unspecified: Principal | ICD-10-CM | POA: Diagnosis present

## 2016-09-02 DIAGNOSIS — Z951 Presence of aortocoronary bypass graft: Secondary | ICD-10-CM | POA: Insufficient documentation

## 2016-09-02 DIAGNOSIS — R29810 Facial weakness: Secondary | ICD-10-CM | POA: Diagnosis not present

## 2016-09-02 DIAGNOSIS — F329 Major depressive disorder, single episode, unspecified: Secondary | ICD-10-CM | POA: Insufficient documentation

## 2016-09-02 DIAGNOSIS — E1169 Type 2 diabetes mellitus with other specified complication: Secondary | ICD-10-CM | POA: Diagnosis present

## 2016-09-02 DIAGNOSIS — I10 Essential (primary) hypertension: Secondary | ICD-10-CM | POA: Diagnosis not present

## 2016-09-02 DIAGNOSIS — F17210 Nicotine dependence, cigarettes, uncomplicated: Secondary | ICD-10-CM | POA: Diagnosis not present

## 2016-09-02 DIAGNOSIS — I110 Hypertensive heart disease with heart failure: Secondary | ICD-10-CM | POA: Diagnosis not present

## 2016-09-02 DIAGNOSIS — Z7982 Long term (current) use of aspirin: Secondary | ICD-10-CM | POA: Insufficient documentation

## 2016-09-02 DIAGNOSIS — R200 Anesthesia of skin: Secondary | ICD-10-CM

## 2016-09-02 DIAGNOSIS — F419 Anxiety disorder, unspecified: Secondary | ICD-10-CM

## 2016-09-02 DIAGNOSIS — G451 Carotid artery syndrome (hemispheric): Secondary | ICD-10-CM

## 2016-09-02 LAB — TROPONIN I: Troponin I: <0.03 ng/mL (ref <0.03)

## 2016-09-02 LAB — DIFFERENTIAL
BASOS ABS: 0 10*3/uL (ref 0.0–0.1)
Basophils Relative: 0 %
EOS ABS: 0.2 10*3/uL (ref 0.0–0.7)
EOS PCT: 2 %
LYMPHS ABS: 3.3 10*3/uL (ref 0.7–4.0)
Lymphocytes Relative: 36 %
Monocytes Absolute: 0.6 10*3/uL (ref 0.1–1.0)
Monocytes Relative: 6 %
NEUTROS PCT: 56 %
Neutro Abs: 5.2 10*3/uL (ref 1.7–7.7)

## 2016-09-02 LAB — COMPREHENSIVE METABOLIC PANEL
ALT: 14 U/L (ref 14–54)
ANION GAP: 6 (ref 5–15)
AST: 18 U/L (ref 15–41)
Albumin: 3.2 g/dL — ABNORMAL LOW (ref 3.5–5.0)
Alkaline Phosphatase: 89 U/L (ref 38–126)
BUN: 22 mg/dL — AB (ref 6–20)
CHLORIDE: 103 mmol/L (ref 101–111)
CO2: 25 mmol/L (ref 22–32)
Calcium: 9.2 mg/dL (ref 8.9–10.3)
Creatinine, Ser: 1.39 mg/dL — ABNORMAL HIGH (ref 0.44–1.00)
GFR calc Af Amer: 47 mL/min — ABNORMAL LOW (ref >60)
GFR calc non Af Amer: 41 mL/min — ABNORMAL LOW (ref >60)
GLUCOSE: 332 mg/dL — AB (ref 65–99)
POTASSIUM: 5 mmol/L (ref 3.5–5.1)
SODIUM: 134 mmol/L — AB (ref 135–145)
Total Bilirubin: 0.4 mg/dL (ref 0.3–1.2)
Total Protein: 6.6 g/dL (ref 6.5–8.1)

## 2016-09-02 LAB — I-STAT CHEM 8, ED
BUN: 24 mg/dL — AB (ref 6–20)
CALCIUM ION: 1.09 mmol/L — AB (ref 1.15–1.40)
CHLORIDE: 103 mmol/L (ref 101–111)
CREATININE: 1.4 mg/dL — AB (ref 0.44–1.00)
Glucose, Bld: 321 mg/dL — ABNORMAL HIGH (ref 65–99)
HCT: 34 % — ABNORMAL LOW (ref 36.0–46.0)
Hemoglobin: 11.6 g/dL — ABNORMAL LOW (ref 12.0–15.0)
Potassium: 4.9 mmol/L (ref 3.5–5.1)
Sodium: 137 mmol/L (ref 135–145)
TCO2: 26 mmol/L (ref 0–100)

## 2016-09-02 LAB — LIPID PANEL
CHOLESTEROL: 137 mg/dL (ref 0–200)
HDL: 61 mg/dL (ref >40)
LDL Cholesterol: 63 mg/dL (ref 0–99)
Total CHOL/HDL Ratio: 2.2 RATIO
Triglycerides: 67 mg/dL (ref <150)
VLDL: 13 mg/dL (ref 0–40)

## 2016-09-02 LAB — CBC
HCT: 32.9 % — ABNORMAL LOW (ref 36.0–46.0)
HEMOGLOBIN: 10.2 g/dL — AB (ref 12.0–15.0)
MCH: 24.3 pg — AB (ref 26.0–34.0)
MCHC: 31 g/dL (ref 30.0–36.0)
MCV: 78.3 fL (ref 78.0–100.0)
PLATELETS: 321 10*3/uL (ref 150–400)
RBC: 4.2 MIL/uL (ref 3.87–5.11)
RDW: 17.2 % — ABNORMAL HIGH (ref 11.5–15.5)
WBC: 9.2 10*3/uL (ref 4.0–10.5)

## 2016-09-02 LAB — GLUCOSE, CAPILLARY
GLUCOSE-CAPILLARY: 206 mg/dL — AB (ref 65–99)
Glucose-Capillary: 230 mg/dL — ABNORMAL HIGH (ref 65–99)
Glucose-Capillary: 167 mg/dL — ABNORMAL HIGH (ref 65–99)
Glucose-Capillary: 183 mg/dL — ABNORMAL HIGH (ref 65–99)
Glucose-Capillary: 343 mg/dL — ABNORMAL HIGH (ref 65–99)

## 2016-09-02 LAB — APTT: aPTT: 29 seconds (ref 24–36)

## 2016-09-02 LAB — I-STAT TROPONIN, ED: Troponin i, poc: 0 ng/mL (ref 0.00–0.08)

## 2016-09-02 LAB — PROTIME-INR
INR: 0.82
Prothrombin Time: 11.2 seconds — ABNORMAL LOW (ref 11.4–15.2)

## 2016-09-02 LAB — HIV ANTIBODY (ROUTINE TESTING W REFLEX): HIV SCREEN 4TH GENERATION: NONREACTIVE

## 2016-09-02 LAB — CBG MONITORING, ED: GLUCOSE-CAPILLARY: 336 mg/dL — AB (ref 65–99)

## 2016-09-02 LAB — ETHANOL: Alcohol, Ethyl (B): <5 mg/dL (ref <5)

## 2016-09-02 MED ORDER — HYDROCHLOROTHIAZIDE 12.5 MG PO CAPS
12.5000 mg | ORAL_CAPSULE | Freq: Every day | ORAL | Status: DC
  Administered 2016-09-02 – 2016-09-03 (×2): 12.5 mg via ORAL
  Filled 2016-09-02 (×2): qty 1

## 2016-09-02 MED ORDER — ADULT MULTIVITAMIN W/MINERALS CH
1.0000 | ORAL_TABLET | Freq: Every day | ORAL | Status: DC
  Administered 2016-09-02 – 2016-09-03 (×2): 1 via ORAL
  Filled 2016-09-02 (×2): qty 1

## 2016-09-02 MED ORDER — ACETAMINOPHEN 325 MG PO TABS
650.0000 mg | ORAL_TABLET | ORAL | Status: DC | PRN
  Administered 2016-09-02: 650 mg via ORAL
  Filled 2016-09-02: qty 2

## 2016-09-02 MED ORDER — IOPAMIDOL (ISOVUE-370) INJECTION 76%
INTRAVENOUS | Status: AC
  Filled 2016-09-02: qty 100

## 2016-09-02 MED ORDER — INSULIN ASPART 100 UNIT/ML ~~LOC~~ SOLN
0.0000 [IU] | Freq: Three times a day (TID) | SUBCUTANEOUS | Status: DC
  Administered 2016-09-02: 3 [IU] via SUBCUTANEOUS
  Administered 2016-09-02: 5 [IU] via SUBCUTANEOUS
  Administered 2016-09-02: 3 [IU] via SUBCUTANEOUS
  Administered 2016-09-03: 11 [IU] via SUBCUTANEOUS
  Administered 2016-09-03: 3 [IU] via SUBCUTANEOUS

## 2016-09-02 MED ORDER — INSULIN GLARGINE 100 UNIT/ML ~~LOC~~ SOLN
35.0000 [IU] | Freq: Every day | SUBCUTANEOUS | Status: DC
  Administered 2016-09-02: 35 [IU] via SUBCUTANEOUS
  Filled 2016-09-02 (×2): qty 0.35

## 2016-09-02 MED ORDER — IOPAMIDOL (ISOVUE-370) INJECTION 76%
INTRAVENOUS | Status: AC
  Administered 2016-09-02: 01:00:00
  Filled 2016-09-02: qty 100

## 2016-09-02 MED ORDER — GABAPENTIN 300 MG PO CAPS
300.0000 mg | ORAL_CAPSULE | Freq: Three times a day (TID) | ORAL | Status: DC
  Administered 2016-09-02 – 2016-09-03 (×4): 300 mg via ORAL
  Filled 2016-09-02 (×4): qty 1

## 2016-09-02 MED ORDER — METOPROLOL TARTRATE 50 MG PO TABS
50.0000 mg | ORAL_TABLET | Freq: Two times a day (BID) | ORAL | Status: DC
  Administered 2016-09-02 – 2016-09-03 (×3): 50 mg via ORAL
  Filled 2016-09-02 (×3): qty 1

## 2016-09-02 MED ORDER — LORAZEPAM 2 MG/ML IJ SOLN
1.0000 mg | Freq: Once | INTRAMUSCULAR | Status: AC
  Administered 2016-09-02: 1 mg via INTRAVENOUS
  Filled 2016-09-02: qty 1

## 2016-09-02 MED ORDER — ATORVASTATIN CALCIUM 10 MG PO TABS
10.0000 mg | ORAL_TABLET | Freq: Every day | ORAL | Status: DC
  Administered 2016-09-02: 10 mg via ORAL
  Filled 2016-09-02: qty 1

## 2016-09-02 MED ORDER — STROKE: EARLY STAGES OF RECOVERY BOOK
Freq: Once | Status: AC
  Administered 2016-09-02: 06:00:00
  Filled 2016-09-02: qty 1

## 2016-09-02 MED ORDER — FUROSEMIDE 20 MG PO TABS
20.0000 mg | ORAL_TABLET | Freq: Every day | ORAL | Status: DC
  Administered 2016-09-02 – 2016-09-03 (×2): 20 mg via ORAL
  Filled 2016-09-02 (×2): qty 1

## 2016-09-02 MED ORDER — ACETAMINOPHEN 650 MG RE SUPP: 650.0000 mg | RECTAL | Status: DC | PRN

## 2016-09-02 MED ORDER — ASPIRIN EC 81 MG PO TBEC: 81.0000 mg | DELAYED_RELEASE_TABLET | Freq: Every day | ORAL | Status: DC

## 2016-09-02 MED ORDER — ACETAMINOPHEN 160 MG/5ML PO SOLN: 650.0000 mg | ORAL | Status: DC | PRN

## 2016-09-02 MED ORDER — ASPIRIN 325 MG PO TABS
325.0000 mg | ORAL_TABLET | Freq: Every day | ORAL | Status: DC
  Administered 2016-09-02 – 2016-09-03 (×2): 325 mg via ORAL
  Filled 2016-09-02 (×2): qty 1

## 2016-09-02 MED ORDER — PANTOPRAZOLE SODIUM 40 MG PO TBEC
40.0000 mg | DELAYED_RELEASE_TABLET | Freq: Every day | ORAL | Status: DC
  Administered 2016-09-02 – 2016-09-03 (×2): 40 mg via ORAL
  Filled 2016-09-02 (×2): qty 1

## 2016-09-02 MED ORDER — ENOXAPARIN SODIUM 40 MG/0.4ML ~~LOC~~ SOLN
40.0000 mg | SUBCUTANEOUS | Status: DC
  Administered 2016-09-02 – 2016-09-03 (×2): 40 mg via SUBCUTANEOUS
  Filled 2016-09-02 (×3): qty 0.4

## 2016-09-02 MED ORDER — LISINOPRIL 10 MG PO TABS
10.0000 mg | ORAL_TABLET | Freq: Every day | ORAL | Status: DC
  Administered 2016-09-02 – 2016-09-03 (×2): 10 mg via ORAL
  Filled 2016-09-02 (×2): qty 1

## 2016-09-02 NOTE — Progress Notes (Signed)
Patient arrived to unit via ED staff, vitals stable, tele applied and verified. Admission unable to be completed as pt too drowsy from meds for MRI.  Continue to monitor patient.

## 2016-09-02 NOTE — H&P (Addendum)
History and Physical    Deborah Coffey PWW:[id number] DOB: 05-22-1955 DOA: 09/02/2016  PCP: Quentin Angst, MD  Patient coming from: Home  I have personally briefly reviewed patient's old medical records in [facility]  Chief Complaint: Facial droop  HPI: Deborah Coffey is a 61 y.o. female with medical history significant of TIA vs Conversion disorder back in Aug 2015.  CAD s/p CABG.  Uncontrolled DM, HTN, HLD.  Patient presents to the ED as a code stroke.  L Sided facial droop and L sided weakness noted.  Patient came to ED about 5 hours ago for CP but left without being seen.  Noticed she had a "twisted mouth" with speech difficulty and confusion about 4 hours ago.  Symptoms persisted and patient came in to ED as a code stroke.  Of note please see admission back in Sept 2015 with similar symptoms.   ED Course: Facial droop has resolved at this point.  EDP states patients facial findings looked like the Sept 2015 images in last progress note during admission.   Review of Systems: As per HPI otherwise 10 point review of systems negative.   Past Medical History:  Diagnosis Date  . Allergy   . Anemia   . CAD in native artery 02/2004   Mid LAD lesoin just after Large D1 --> CABG x 1 LIMA-LAD  . CAP (community acquired pneumonia) 08/07/2014  . Chronic bronchitis (HCC)    "I think I get it q yr" (08/07/2014)  . Depression   . High cholesterol   . History of blood transfusion 2005   "related to OHS"   . Hypertension   . 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 competitive flow  . Type II diabetes mellitus (HCC)     Past Surgical History:  Procedure Laterality Date  . CARDIAC CATHETERIZATION  2010   Previous LAD 95% lesion - now ~30-40%; patent LIMA with competitive flow  . CESAREAN SECTION  1977; 1989  . CHOLECYSTECTOMY  ~ 2012  . CORONARY ARTERY BYPASS GRAFT  02/2004   "CABG X1" (03/22/2013); LIMA-LAD  . TUBAL  LIGATION  1989     reports that she has been smoking Cigarettes.  She has a 21.00 pack-year smoking history. She has never used smokeless tobacco. She reports that she drinks about 0.6 - 1.2 oz of alcohol per week . She reports that she does not use drugs.  Allergies  Allergen Reactions  . Ciprofloxacin Rash    Family History  Problem Relation Age of Onset  . Heart disease Mother   . Diabetes Mother   . Cancer Mother        cervical  . Diabetes Father   . Lupus Sister   . Heart failure Sister   . Hepatitis C Sister   . Heart failure Brother   . Diabetes Brother   . Hypertension Daughter   . Colon cancer Neg Hx      Prior to Admission medications   Medication Sig Start Date End Date Taking? Authorizing Provider  aspirin EC 81 MG tablet Take 81 mg by mouth daily.   Yes [provider]  atorvastatin (LIPITOR) 10 MG tablet Take 1 tablet (10 mg total) by mouth daily. 07/15/16  Yes Quentin Angst, MD  furosemide (LASIX) 20 MG tablet Take 1 tablet (20 mg total) by mouth daily. 07/15/16  Yes Quentin Angst, MD  gabapentin (NEURONTIN) 300 MG capsule Take 1  capsule (300 mg total) by mouth 3 (three) times daily. 07/07/16  Yes Tresa Garter, MD  hydrochlorothiazide (MICROZIDE) 12.5 MG capsule Take 1 capsule (12.5 mg total) by mouth daily. 07/15/16  Yes Tresa Garter, MD  Insulin Glargine (TOUJEO SOLOSTAR) 300 UNIT/ML SOPN Inject 35 Units into the skin daily with breakfast. 07/14/16  Yes Jegede, Olugbemiga E, MD  lisinopril (PRINIVIL,ZESTRIL) 10 MG tablet Take 1 tablet (10 mg total) by mouth daily. 07/07/16  Yes Jegede, Marlena Clipper, MD  metFORMIN (GLUCOPHAGE-XR) 500 MG 24 hr tablet TAKE 2 TABLETS BY MOUTH 2 TIMES DAILY WITH A MEAL 07/15/16  Yes Jegede, Olugbemiga E, MD  metoprolol (LOPRESSOR) 50 MG tablet Take 1 tablet (50 mg total) by mouth 2 (two) times daily. 07/15/16  Yes Tresa Garter, MD  Multiple Vitamin (MULTIVITAMIN WITH MINERALS) TABS tablet Take  1 tablet by mouth daily.   Yes [provider]  nitroGLYCERIN (NITROSTAT) 0.4 MG SL tablet Place 1 tablet (0.4 mg total) under the tongue every 5 (five) minutes as needed for chest pain. 06/26/15  Yes Tresa Garter, MD  pantoprazole (PROTONIX) 40 MG tablet Take 1 tablet (40 mg total) by mouth daily. 07/07/16  Yes Tresa Garter, MD  ACCU-CHEK FASTCLIX LANCETS MISC Use as directed to check blood glucose 3 times daily. E11.9 07/07/16   Tresa Garter, MD  Alcohol Swabs (B-D SINGLE USE SWABS REGULAR) PADS Use as directed 07/07/16   Tresa Garter, MD  Blood Glucose Monitoring Suppl (ACCU-CHEK NANO SMARTVIEW) w/Device KIT Use as directed to check blood glucose 3 times daily. E11.9 07/07/16   Tresa Garter, MD  glucose blood (ACCU-CHEK SMARTVIEW) test strip Use as instructed to test blood glucose 3 times daily. E11.9 07/15/16   Tresa Garter, MD    Physical Exam: Vitals:   09/02/16 0145 09/02/16 0200 09/02/16 0215 09/02/16 0230  BP: 128/62 129/61 (!) 130/59 135/62  Pulse: 82 78 74 71  Resp: '18 17 15 14  '$ Temp:      TempSrc:      SpO2: 95% 97% 97% 99%    Constitutional: NAD, calm, comfortable Eyes: PERRL, lids and conjunctivae normal ENMT: Mucous membranes are moist. Posterior pharynx clear of any exudate or lesions.Normal dentition.  Neck: normal, supple, no masses, no thyromegaly Respiratory: clear to auscultation bilaterally, no wheezing, no crackles. Normal respiratory effort. No accessory muscle use.  Cardiovascular: Regular rate and rhythm, no murmurs / rubs / gallops. No extremity edema. 2+ pedal pulses. No carotid bruits.  Abdomen: no tenderness, no masses palpated. No hepatosplenomegaly. Bowel sounds positive.  Musculoskeletal: no clubbing / cyanosis. No joint deformity upper and lower extremities. Good ROM, no contractures. Normal muscle tone.  Skin: no rashes, lesions, ulcers. No induration Neurologic: CN 2-12 grossly intact. Sensation intact,  DTR normal. Strength 5/5 in all 4.  Psychiatric: Normal judgment and insight. Alert and oriented x 3. Normal mood.    Labs on Admission: I have personally reviewed following labs and imaging studies  CBC:  Recent Labs Lab 09/01/16 1454 09/02/16 0038 09/02/16 0043  WBC 8.4 9.2  --   NEUTROABS  --  5.2  --   HGB 10.2* 10.2* 11.6*  HCT 32.6* 32.9* 34.0*  MCV 77.4* 78.3  --   PLT 339 321  --    Basic Metabolic Panel:  Recent Labs Lab 09/01/16 1454 09/02/16 0038 09/02/16 0043  NA 132* 134* 137  K 5.1 5.0 4.9  CL 100* 103 103  CO2 24 25  --  GLUCOSE 330* 332* 321*  BUN 21* 22* 24*  CREATININE 1.09* 1.39* 1.40*  CALCIUM 9.4 9.2  --    GFR: Estimated Creatinine Clearance: 54.7 mL/min (A) (by C-G formula based on SCr of 1.4 mg/dL (H)). Liver Function Tests:  Recent Labs Lab 09/02/16 0038  AST 18  ALT 14  ALKPHOS 89  BILITOT 0.4  PROT 6.6  ALBUMIN 3.2*   No results for input(s): LIPASE, AMYLASE in the last 168 hours. No results for input(s): AMMONIA in the last 168 hours. Coagulation Profile:  Recent Labs Lab 09/02/16 0038  INR 0.82   Cardiac Enzymes:  Recent Labs Lab 09/02/16 0038  TROPONINI <0.03   BNP (last 3 results) No results for input(s): PROBNP in the last 8760 hours. HbA1C: No results for input(s): HGBA1C in the last 72 hours. CBG:  Recent Labs Lab 09/02/16 0042  GLUCAP 336*   Lipid Profile: No results for input(s): CHOL, HDL, LDLCALC, TRIG, CHOLHDL, LDLDIRECT in the last 72 hours. Thyroid Function Tests: No results for input(s): TSH, T4TOTAL, FREET4, T3FREE, THYROIDAB in the last 72 hours. Anemia Panel: No results for input(s): VITAMINB12, FOLATE, FERRITIN, TIBC, IRON, RETICCTPCT in the last 72 hours. Urine analysis:    Component Value Date/Time   COLORURINE YELLOW 02/27/2015 1236   APPEARANCEUR Clear 07/14/2016 1028   LABSPEC 1.017 02/27/2015 1236   PHURINE 5.0 02/27/2015 1236   GLUCOSEU 2+ (A) 07/14/2016 1028   HGBUR  NEGATIVE 02/27/2015 1236   BILIRUBINUR Negative 07/14/2016 1028   KETONESUR NEGATIVE 02/27/2015 1236   PROTEINUR 1+ (A) 07/14/2016 1028   PROTEINUR TRACE (A) 02/27/2015 1236   UROBILINOGEN 0.2 08/07/2014 1925   NITRITE Negative 07/14/2016 1028   NITRITE NEGATIVE 02/27/2015 1236   LEUKOCYTESUR Negative 07/14/2016 1028    Radiological Exams on Admission: Dg Chest 2 View  Result Date: 09/01/2016 CLINICAL DATA:  Chest pain EXAM: CHEST  2 VIEW COMPARISON:  06/15/2015 FINDINGS: Post sternotomy changes. No acute consolidation or effusion. Cardiomediastinal silhouette within normal limits. No pneumothorax. Degenerative changes of the spine. IMPRESSION: No active cardiopulmonary disease. Electronically Signed   By: Donavan Foil M.D.   On: 09/01/2016 15:51   Ct Angio Chest/abd/pel For Dissection W And/or Wo Contrast  Result Date: 09/02/2016 CLINICAL DATA:  Left-sided weakness.  Central chest pain. EXAM: CT ANGIOGRAPHY CHEST, ABDOMEN AND PELVIS TECHNIQUE: Multidetector CT imaging through the chest, abdomen and pelvis was performed using the standard protocol during bolus administration of intravenous contrast. Multiplanar reconstructed images and MIPs were obtained and reviewed to evaluate the vascular anatomy. CONTRAST:  100 cc Isovue 370 IV COMPARISON:  Chest radiograph yesterday. FINDINGS: CTA CHEST FINDINGS Cardiovascular: Normal caliber thoracic aorta without dissection, hematoma, aneurysm or acute aortic syndrome. Common origin of the innominate and left common carotid artery, normal variant. No filling defects in the central pulmonary arteries to suggest pulmonary embolus. Heart is upper normal in size. No pericardial effusion. Patient is post CABG. Mediastinum/Nodes: No mediastinal or hilar adenopathy. No axillary adenopathy. Tiny hiatal hernia. Trachea and mainstem bronchi are patent. Lungs/Pleura: No consolidation or pulmonary edema. Small subpleural blebs in the left lower lobe. Faint clustered  nodular opacities in the right upper lobe. No pulmonary mass. No pleural effusion. Musculoskeletal: Degenerative change in the thoracic spine. No acute abnormality. Post median sternotomy. Review of the MIP images confirms the above findings. CTA ABDOMEN AND PELVIS FINDINGS VASCULAR Aorta: Normal caliber aorta without aneurysm, dissection, vasculitis or significant stenosis. Minimal atherosclerosis distally. Celiac: Patent without evidence of aneurysm, dissection, vasculitis or  significant stenosis. SMA: Patent without evidence of aneurysm, dissection, vasculitis or significant stenosis. Renals: Both renal arteries are patent without evidence of aneurysm, dissection, vasculitis, fibromuscular dysplasia or significant stenosis. Instance note of 2 right renal arteries. IMA: Patent without evidence of aneurysm, dissection, vasculitis or significant stenosis. Inflow: Patent without evidence of aneurysm, dissection, vasculitis or significant stenosis. Veins: No obvious venous abnormality within the limitations of this arterial phase study. Review of the MIP images confirms the above findings. NON-VASCULAR Hepatobiliary: No evidence of focal lesion allowing for arterial phase imaging. Clips in the gallbladder fossa postcholecystectomy. No biliary dilatation. Pancreas: No ductal dilatation or inflammation. Spleen: Normal arterial phase imaging.  Normal in size. Adrenals/Urinary Tract: Right adrenal adenoma measures fat density and is unchanged from 08/09/2014 CT. Minimal nodularity of the left adrenal gland without dominant lesion. No hydronephrosis or perinephric edema. Urinary bladder is physiologically distended. Stomach/Bowel: Ingested material within the stomach. Questionable gastric fundal and body wall thickening. No evidence of bowel obstruction, inflammation or wall thickening. Normal appendix. Moderate stool burden. Colonic diverticulosis from the transverse through the sigmoid, no acute diverticulitis.  Lymphatic: Prominent right inguinal lymph nodes are unchanged from 08/09/2014 abdominal CT and likely reactive. Reproductive: Uterus and bilateral adnexa are unremarkable. Other: No ascites or free air. Laxity of the anterior abdominal wall musculature. Musculoskeletal: Degenerative disc disease and facet arthropathy in the lumbar spine. No acute or suspicious osseous abnormalities. Review of the MIP images confirms the above findings. IMPRESSION: 1. No aortic dissection or acute aortic syndrome. Abdominal aortic atherosclerosis. 2. Faint clustered nodularity in the right upper lung, likely bronchiolitis, infectious or inflammatory. 3. Incidental findings in the abdomen include right adrenal adenoma and colonic diverticulosis without acute inflammation. Electronically Signed   By: Rubye Oaks M.D.   On: 09/02/2016 02:24   Ct Head Code Stroke W/o Cm  Result Date: 09/02/2016 CLINICAL DATA:  Code stroke.  Left-sided facial droop EXAM: CT HEAD WITHOUT CONTRAST TECHNIQUE: Contiguous axial images were obtained from the base of the skull through the vertex without intravenous contrast. COMPARISON:  Brain MRI 01/01/2014 FINDINGS: Brain: No mass lesion, intraparenchymal hemorrhage or extra-axial collection. No evidence of acute cortical infarct. Brain parenchyma and CSF-containing spaces are normal for age. Partially empty sella, unchanged. Vascular: No hyperdense vessel or unexpected calcification. Skull: Normal visualized skull base, calvarium and extracranial soft tissues. Sinuses/Orbits: No sinus fluid levels or advanced mucosal thickening. No mastoid effusion. Normal orbits. ASPECTS [facility] Stroke Program Early CT Score) - Ganglionic level infarction (caudate, lentiform nuclei, internal capsule, insula, M1-M3 cortex): 7 - Supraganglionic infarction (M4-M6 cortex): 3 Total score (0-10 with 10 being normal): 10 IMPRESSION: 1. No acute abnormality. 2. ASPECTS is 10. These results were called by telephone at the  time of interpretation on 09/02/2016 at 12:44 am to Dr. Noel Christmas, who verbally acknowledged these results. Electronically Signed   By: Deatra Robinson M.D.   On: 09/02/2016 00:45    EKG: Independently reviewed.  Assessment/Plan Principal Problem:   TIA (transient ischemic attack) Active Problems:   Accelerated hypertension   CAD (coronary artery disease) - CABG x 1 (LIMA-LAD)   Conversion disorder   Diabetic mononeuropathy associated with type 2 diabetes mellitus (HCC)    1. TIA vs conversion disorder - 1. TIA work up and pathway 2. MRI/MRA 3. 2d echo 4. Carotid dopplers 5. Tele monitor 6. A1C and lipid profile 7. ASA 325 2. DM2 - 1. Lantus 35 QHS 2. Mod scale SSI AC 3. HTN - continue home BP meds  4. CAD - continue statin  DVT prophylaxis: Lovenox Code Status: Full Family Communication: Family at bedside Disposition Plan: Home after admit Consults called: Neuro Admission status: Place in obs   [place], [place] [facility] Pager [phone]  If 7AM-7PM, please contact day team taking care of patient [email or web address] Password [email or web address]  09/02/2016, 2:52 AM

## 2016-09-02 NOTE — ED Notes (Signed)
Delay in lab draw,  Pt in MRI. 

## 2016-09-02 NOTE — Care Management Obs Status (Signed)
[place] NOTIFICATION   Patient Details  Name: Deborah Coffey MRN: [id number] Date of Birth: [birth year]   Medicare Observation Status Notification Given:  Yes    Pollie Friar, RN 09/02/2016, 11:01 AM

## 2016-09-02 NOTE — ED Notes (Signed)
No addl blood draw,  Pt enroute to inpatient floor. 

## 2016-09-02 NOTE — ED Provider Notes (Signed)
[facility] DEPT Provider Note   CSN: [id number] Arrival date & time: 09/02/16  0012  By signing my name below, I, Jeanell Sparrow, attest that this documentation has been prepared under the direction and in the presence of Rancour, Annie Main, MD. Electronically Signed: Jeanell Sparrow, Scribe. 09/02/2016. 12:41 AM.  History   Chief Complaint Chief Complaint  Patient presents with  . Facial Droop    Code Stroke   The history is provided by medical records, a relative and the patient. The history is limited by the condition of the patient. No language interpreter was used.    LEVEL 5 CAVEAT DUE TO UNRESPONSIVENESS  HPI Comments: Deborah Coffey is a 61 y.o. female with a PMHx of DM (type 2), TIA, and CAD who presents to the Emergency Department complaining of constant moderate left-sided facial droop. Daughter states that she came to the ED about 5 hours ago for chest pain (constant all day) but LWBS. She noticed pt had a "twisted mouth" with speech difficulty and confusion about 4 hours ago. She then had left-sided facial droop about an hour ago. 7pm yesterday was the last time she was normal today. She further reports associated generalized weakness, confusion, and RUE pain. Denies any known use of anti-coagulents.  After returning to baseline, pt reports having ongoing chest pain, but today was the first today it radiated to her neck.     PCP: Tresa Garter, MD  Past Medical History:  Diagnosis Date  . Allergy   . Anemia   . CAD in native artery 02/2004   Mid LAD lesoin just after Large D1 --> CABG x 1 LIMA-LAD  . CAP (community acquired pneumonia) 08/07/2014  . Chronic bronchitis ([place])    "I think I get it q yr" (08/07/2014)  . Depression   . High cholesterol   . History of blood transfusion 2005   "related to OHS"   . Hypertension   . 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 competitive flow  . Type II diabetes mellitus  [facility])     Patient Active Problem List   Diagnosis Date Noted  . Eczema 01/29/2016  . History of adenomatous polyp of colon 10/01/2015  . Floaters in visual field 09/04/2015  . SOB (shortness of breath) 06/26/2015  . Screening for colon cancer 06/26/2015  . Type 2 diabetes mellitus with complication, with long-term current use of insulin ([place]) 02/27/2015  . Diabetic mononeuropathy associated with type 2 diabetes mellitus ([place]) 02/27/2015  . LLQ abdominal pain   . CAP (community acquired pneumonia) 08/07/2014  . AKI (acute kidney injury) ([place]) 08/07/2014  . Type 2 diabetes mellitus with hyperglycemia ([place]) 07/08/2014  . Preventative health care 07/08/2014  . Depression 07/08/2014  . Acute upper respiratory infection 07/08/2014  . Smoking addiction 07/08/2014  . Essential hypertension 01/10/2014  . Depression (emotion) 01/10/2014  . H/O TIA (transient ischemic attack) and stroke 01/10/2014  . Visual changes 01/02/2014  . Facial weakness 01/02/2014  . Conversion disorder 01/01/2014  . Tobacco use 06/20/2013  . DM (diabetes mellitus) type 2, uncontrolled, with ketoacidosis 04/03/2013  . Chest pain 03/22/2013  . HTN (hypertension) 03/22/2013  . Essential hypertension, benign 02/14/2013  . Accelerated hypertension 09/19/2012  . Uncontrolled diabetes mellitus ([place]) 09/19/2012  . Dyslipidemia 09/19/2012  . CAD (coronary artery disease) - CABG x 1 (LIMA-LAD) 02/21/2004    Past Surgical History:  Procedure Laterality Date  . CARDIAC CATHETERIZATION  2010  Previous LAD 95% lesion - now ~30-40%; patent LIMA with competitive flow  . CESAREAN SECTION  1977; 1989  . CHOLECYSTECTOMY  ~ 2012  . CORONARY ARTERY BYPASS GRAFT  02/2004   "CABG X1" (03/22/2013); LIMA-LAD  . TUBAL LIGATION  1989    OB History    No data available       Home Medications    Prior to Admission medications   Medication Sig Start Date End Date Taking? Authorizing Provider  aspirin EC 81 MG tablet Take 81  mg by mouth daily.   Yes [provider]  atorvastatin (LIPITOR) 10 MG tablet Take 1 tablet (10 mg total) by mouth daily. 07/15/16  Yes Tresa Garter, MD  furosemide (LASIX) 20 MG tablet Take 1 tablet (20 mg total) by mouth daily. 07/15/16  Yes Tresa Garter, MD  gabapentin (NEURONTIN) 300 MG capsule Take 1 capsule (300 mg total) by mouth 3 (three) times daily. 07/07/16  Yes Tresa Garter, MD  hydrochlorothiazide (MICROZIDE) 12.5 MG capsule Take 1 capsule (12.5 mg total) by mouth daily. 07/15/16  Yes Tresa Garter, MD  Insulin Glargine (TOUJEO SOLOSTAR) 300 UNIT/ML SOPN Inject 35 Units into the skin daily with breakfast. 07/14/16  Yes Jegede, Olugbemiga E, MD  lisinopril (PRINIVIL,ZESTRIL) 10 MG tablet Take 1 tablet (10 mg total) by mouth daily. 07/07/16  Yes Jegede, Marlena Clipper, MD  metFORMIN (GLUCOPHAGE-XR) 500 MG 24 hr tablet TAKE 2 TABLETS BY MOUTH 2 TIMES DAILY WITH A MEAL 07/15/16  Yes Jegede, Olugbemiga E, MD  metoprolol (LOPRESSOR) 50 MG tablet Take 1 tablet (50 mg total) by mouth 2 (two) times daily. 07/15/16  Yes Tresa Garter, MD  Multiple Vitamin (MULTIVITAMIN WITH MINERALS) TABS tablet Take 1 tablet by mouth daily.   Yes [provider]  nitroGLYCERIN (NITROSTAT) 0.4 MG SL tablet Place 1 tablet (0.4 mg total) under the tongue every 5 (five) minutes as needed for chest pain. 06/26/15  Yes Tresa Garter, MD  pantoprazole (PROTONIX) 40 MG tablet Take 1 tablet (40 mg total) by mouth daily. 07/07/16  Yes Tresa Garter, MD  ACCU-CHEK FASTCLIX LANCETS MISC Use as directed to check blood glucose 3 times daily. E11.9 07/07/16   Tresa Garter, MD  Alcohol Swabs (B-D SINGLE USE SWABS REGULAR) PADS Use as directed 07/07/16   Tresa Garter, MD  Blood Glucose Monitoring Suppl (ACCU-CHEK NANO SMARTVIEW) w/Device KIT Use as directed to check blood glucose 3 times daily. E11.9 07/07/16   Tresa Garter, MD  canagliflozin (INVOKANA)  100 MG TABS tablet Take 1 tablet (100 mg total) by mouth daily before breakfast. Patient not taking: Reported on 09/02/2016 07/14/16   Tresa Garter, MD  cetirizine (ZYRTEC) 10 MG tablet Take 1 tablet (10 mg total) by mouth daily. Patient not taking: Reported on 09/02/2016 12/12/15   Arnoldo Morale, MD  glucose blood (ACCU-CHEK SMARTVIEW) test strip Use as instructed to test blood glucose 3 times daily. E11.9 07/15/16   Tresa Garter, MD  triamcinolone ointment (KENALOG) 0.5 % Apply 1 application topically 2 (two) times daily. Patient not taking: Reported on 09/02/2016 02/11/16   Tresa Garter, MD  Vitamin D, Ergocalciferol, (DRISDOL) 50000 units CAPS capsule Take 1 capsule (50,000 Units total) by mouth every 7 (seven) days. Patient not taking: Reported on 09/02/2016 07/15/16   Tresa Garter, MD    Family History Family History  Problem Relation Age of Onset  . Heart disease Mother   . Diabetes  Mother   . Cancer Mother        cervical  . Diabetes Father   . Lupus Sister   . Heart failure Sister   . Hepatitis C Sister   . Heart failure Brother   . Diabetes Brother   . Hypertension Daughter   . Colon cancer Neg Hx     Social History Social History  Substance Use Topics  . Smoking status: Current Some Day Smoker    Packs/day: 0.50    Years: 42.00    Types: Cigarettes  . Smokeless tobacco: Never Used  . Alcohol use 0.6 - 1.2 oz/week    1 - 2 Glasses of wine per week     Comment: 08/07/2014 "glass of wine maybe 2 times/yr"     Allergies   Ciprofloxacin   Review of Systems Review of Systems  Unable to perform ROS: Patient unresponsive     Physical Exam Updated Vital Signs BP (!) 118/40 (BP Location: Left Arm)   Pulse 89   Temp 98.5 F (36.9 C) (Axillary)   Resp 18   SpO2 97%   Physical Exam  Constitutional: She appears well-developed and well-nourished. No distress.  HENT:  Head: Normocephalic and atraumatic.  Mouth/Throat: Oropharynx is  clear and moist. No oropharyngeal exudate.  Eyes: Conjunctivae and EOM are normal. Pupils are equal, round, and reactive to light.  Neck: Normal range of motion. Neck supple.  No meningismus.  Cardiovascular: Normal rate, regular rhythm, normal heart sounds and intact distal pulses.   No murmur heard. Pulmonary/Chest: Effort normal and breath sounds normal. No respiratory distress. She exhibits tenderness (Left-sided).  Abdominal: Soft. There is no tenderness. There is no rebound and no guarding.  Musculoskeletal: Normal range of motion. She exhibits no edema or tenderness.  Neurological: She is alert.  Not speaking or responsive. Left facial droop noted that is resolved by smiling. 4/5 LUE strength. Difficulty with raising LUE. Decreased left hand grip strength. Subjective decreased sensation to left side of face and LUE.  Able to raise legs bilaterally without difficulty  Skin: Skin is warm.  Psychiatric: She has a normal mood and affect. Her behavior is normal.  Nursing note and vitals reviewed.    ED Treatments / Results  DIAGNOSTIC STUDIES: Oxygen Saturation is 97% on RA, normal by my interpretation.    COORDINATION OF CARE: 12:24 AM- Plan for treatment was carried out.  Labs (all labs ordered are listed, but only abnormal results are displayed) Labs Reviewed  PROTIME-INR - Abnormal; Notable for the following:       Result Value   Prothrombin Time 11.2 (*)    All other components within normal limits  CBC - Abnormal; Notable for the following:    Hemoglobin 10.2 (*)    HCT 32.9 (*)    MCH 24.3 (*)    RDW 17.2 (*)    All other components within normal limits  COMPREHENSIVE METABOLIC PANEL - Abnormal; Notable for the following:    Sodium 134 (*)    Glucose, Bld 332 (*)    BUN 22 (*)    Creatinine, Ser 1.39 (*)    Albumin 3.2 (*)    GFR calc non Af Amer 41 (*)    GFR calc Af Amer 47 (*)    All other components within normal limits  I-STAT CHEM 8, ED - Abnormal;  Notable for the following:    BUN 24 (*)    Creatinine, Ser 1.40 (*)    Glucose, Bld 321 (*)  Calcium, Ion 1.09 (*)    Hemoglobin 11.6 (*)    HCT 34.0 (*)    All other components within normal limits  CBG MONITORING, ED - Abnormal; Notable for the following:    Glucose-Capillary 336 (*)    All other components within normal limits  ETHANOL  APTT  DIFFERENTIAL  TROPONIN I  RAPID URINE DRUG SCREEN, HOSP PERFORMED  URINALYSIS, ROUTINE W REFLEX MICROSCOPIC  I-STAT TROPOININ, ED  I-STAT TROPOININ, ED    EKG  EKG Interpretation  Date/Time:  Thursday Sep 02 2016 00:37:34 EDT Ventricular Rate:  77 PR Interval:    QRS Duration: 81 QT Interval:  385 QTC Calculation: 436 R Axis:   72 Text Interpretation:  Sinus rhythm No significant change was found Confirmed by Ezequiel Essex [phone]) on 09/02/2016 1:24:27 AM       Radiology Dg Chest 2 View  Result Date: 09/01/2016 CLINICAL DATA:  Chest pain EXAM: CHEST  2 VIEW COMPARISON:  06/15/2015 FINDINGS: Post sternotomy changes. No acute consolidation or effusion. Cardiomediastinal silhouette within normal limits. No pneumothorax. Degenerative changes of the spine. IMPRESSION: No active cardiopulmonary disease. Electronically Signed   By: Donavan Foil M.D.   On: 09/01/2016 15:51   Ct Angio Chest/abd/pel For Dissection W And/or Wo Contrast  Result Date: 09/02/2016 CLINICAL DATA:  Left-sided weakness.  Central chest pain. EXAM: CT ANGIOGRAPHY CHEST, ABDOMEN AND PELVIS TECHNIQUE: Multidetector CT imaging through the chest, abdomen and pelvis was performed using the standard protocol during bolus administration of intravenous contrast. Multiplanar reconstructed images and MIPs were obtained and reviewed to evaluate the vascular anatomy. CONTRAST:  100 cc Isovue 370 IV COMPARISON:  Chest radiograph yesterday. FINDINGS: CTA CHEST FINDINGS Cardiovascular: Normal caliber thoracic aorta without dissection, hematoma, aneurysm or acute aortic  syndrome. Common origin of the innominate and left common carotid artery, normal variant. No filling defects in the central pulmonary arteries to suggest pulmonary embolus. Heart is upper normal in size. No pericardial effusion. Patient is post CABG. Mediastinum/Nodes: No mediastinal or hilar adenopathy. No axillary adenopathy. Tiny hiatal hernia. Trachea and mainstem bronchi are patent. Lungs/Pleura: No consolidation or pulmonary edema. Small subpleural blebs in the left lower lobe. Faint clustered nodular opacities in the right upper lobe. No pulmonary mass. No pleural effusion. Musculoskeletal: Degenerative change in the thoracic spine. No acute abnormality. Post median sternotomy. Review of the MIP images confirms the above findings. CTA ABDOMEN AND PELVIS FINDINGS VASCULAR Aorta: Normal caliber aorta without aneurysm, dissection, vasculitis or significant stenosis. Minimal atherosclerosis distally. Celiac: Patent without evidence of aneurysm, dissection, vasculitis or significant stenosis. SMA: Patent without evidence of aneurysm, dissection, vasculitis or significant stenosis. Renals: Both renal arteries are patent without evidence of aneurysm, dissection, vasculitis, fibromuscular dysplasia or significant stenosis. Instance note of 2 right renal arteries. IMA: Patent without evidence of aneurysm, dissection, vasculitis or significant stenosis. Inflow: Patent without evidence of aneurysm, dissection, vasculitis or significant stenosis. Veins: No obvious venous abnormality within the limitations of this arterial phase study. Review of the MIP images confirms the above findings. NON-VASCULAR Hepatobiliary: No evidence of focal lesion allowing for arterial phase imaging. Clips in the gallbladder fossa postcholecystectomy. No biliary dilatation. Pancreas: No ductal dilatation or inflammation. Spleen: Normal arterial phase imaging.  Normal in size. Adrenals/Urinary Tract: Right adrenal adenoma measures fat density  and is unchanged from 08/09/2014 CT. Minimal nodularity of the left adrenal gland without dominant lesion. No hydronephrosis or perinephric edema. Urinary bladder is physiologically distended. Stomach/Bowel: Ingested material within the stomach. Questionable gastric fundal  and body wall thickening. No evidence of bowel obstruction, inflammation or wall thickening. Normal appendix. Moderate stool burden. Colonic diverticulosis from the transverse through the sigmoid, no acute diverticulitis. Lymphatic: Prominent right inguinal lymph nodes are unchanged from 08/09/2014 abdominal CT and likely reactive. Reproductive: Uterus and bilateral adnexa are unremarkable. Other: No ascites or free air. Laxity of the anterior abdominal wall musculature. Musculoskeletal: Degenerative disc disease and facet arthropathy in the lumbar spine. No acute or suspicious osseous abnormalities. Review of the MIP images confirms the above findings. IMPRESSION: 1. No aortic dissection or acute aortic syndrome. Abdominal aortic atherosclerosis. 2. Faint clustered nodularity in the right upper lung, likely bronchiolitis, infectious or inflammatory. 3. Incidental findings in the abdomen include right adrenal adenoma and colonic diverticulosis without acute inflammation. Electronically Signed   By: Jeb Levering M.D.   On: 09/02/2016 02:24   Ct Head Code Stroke W/o Cm  Result Date: 09/02/2016 CLINICAL DATA:  Code stroke.  Left-sided facial droop EXAM: CT HEAD WITHOUT CONTRAST TECHNIQUE: Contiguous axial images were obtained from the base of the skull through the vertex without intravenous contrast. COMPARISON:  Brain MRI 01/01/2014 FINDINGS: Brain: No mass lesion, intraparenchymal hemorrhage or extra-axial collection. No evidence of acute cortical infarct. Brain parenchyma and CSF-containing spaces are normal for age. Partially empty sella, unchanged. Vascular: No hyperdense vessel or unexpected calcification. Skull: Normal visualized skull  base, calvarium and extracranial soft tissues. Sinuses/Orbits: No sinus fluid levels or advanced mucosal thickening. No mastoid effusion. Normal orbits. ASPECTS [facility] Stroke Program Early CT Score) - Ganglionic level infarction (caudate, lentiform nuclei, internal capsule, insula, M1-M3 cortex): 7 - Supraganglionic infarction (M4-M6 cortex): 3 Total score (0-10 with 10 being normal): 10 IMPRESSION: 1. No acute abnormality. 2. ASPECTS is 10. These results were called by telephone at the time of interpretation on 09/02/2016 at 12:44 am to Dr. Wallie Char, who verbally acknowledged these results. Electronically Signed   By: Ulyses Jarred M.D.   On: 09/02/2016 00:45    Procedures Procedures (including critical care time)  Medications Ordered in ED Medications  iopamidol (ISOVUE-370) 76 % injection (  Contrast Given 09/02/16 0115)     Initial Impression / Assessment and Plan / ED Course  I have reviewed the triage vital signs and the nursing notes.  Pertinent labs & imaging results that were available during my care of the patient were reviewed by me and considered in my medical decision making (see chart for details).  Clinical Course as of Sep 03 231  Thu Sep 02, 2016  2025 Pt's relative arrived to room to give hx. Recheck of pt was also done.   [AT]  0226 Pt's relative states that pt appears at baseline, with normal speech and no confusion. Explained the need for admission and overnight observation. Also discussed ongoing chest pain with RUE pain. Pt and relative understand and agree with treatment plan.   [AT]    Clinical Course User Index [AT] Orion Crook    Code stroke with left-sided weakness and difficulty speaking. Came to ED earlier with chest pain but left without being seen.  CT head obtained and is negative. Seen by Dr. Nicole Kindred of neurology team. NIH scale 3. Deficits are improving. She has subjective weakness and numbness on the left side. He suspects TIA rapid onset  and improvement of symptoms.  EKG is normal sinus rhythm. Troponin is negative. Chest pain is somewhat reproducible to palpation. CT obtained given her neurological deficits and was negative for dissection.  Admission for  stroke workup and further evaluation of chest pain discussed with Dr. Alcario Drought.  Final Clinical Impressions(s) / ED Diagnoses   Final diagnoses:  Transient cerebral ischemia, unspecified type  Atypical chest pain    New Prescriptions New Prescriptions   No medications on file   I personally performed the services described in this documentation, which was scribed in my presence. The recorded information has been reviewed and is accurate.     Ezequiel Essex, MD 09/02/16 [phone]

## 2016-09-02 NOTE — ED Triage Notes (Signed)
Daughter reported that she noticed her mother's left side of lip is drooping at approx. 11:30 pm this evening . Examined by Dr. Wyvonnia Dusky , code stroke initiated and transported to CT scan .

## 2016-09-02 NOTE — Progress Notes (Signed)
Patient seen and examined at bedside, patient admitted after midnight, please see earlier detailed admission note by Etta Quill, DO. Briefly, patient presented with concern for TIA. Workup underway. Neurology consulted. MRI/MRA insignificant for acute process. Patient is still sedated from Ativan. No changes to management.  Cordelia Poche, MD [facility] 09/02/2016, 11:50 AM Pager: [phone]

## 2016-09-02 NOTE — Progress Notes (Signed)
STROKE TEAM PROGRESS NOTE   HISTORY OF PRESENT ILLNESS (per record) Deborah Coffey is an 61 y.o. female history diabetes mellitus, coronary artery disease with CABG, hypertension and new-onset weakness and numbness involving left face and left upper extremity. Patient was last known well at about 7:00 PM on 09/01/2016. She was again seen by her daughter at 11:18 PM and had left facial droop and slurred speech as well as complaint of numbness involving face and left upper extremity. She had been seen in the ED earlier today for chest pain and had left at around 7 PM prior to being clinically evaluated. She is still complaining of chest pain. CT scan of her head showed no acute intracranial abnormality. She has a history of TIA and is on aspirin daily. NIH stroke score is time this evaluation was 2. Deficits had improved significantly. Patient was not administered IV t-PA secondary to rapidly improving deficits. She was admitted for further evaluation and treatment.   SUBJECTIVE (INTERVAL HISTORY) No family at bedside. She is eating lunch during round. She recounted HPI with me. She came to our ED earlier yesterday for chest pain, but left without seen due to long waiting time. She went back home to sleep and on waking up at 11pm. She continued to have chest pain, and the pain up to her left face, left neck, left shoulder and left arm. She came back to our ED and admitted for further evaluation. CTA chest, MRI and MRA negative. Now her pain resolved.   She stated that she has a lot of stress now at home. She is in disability and lives with her daughter. Over the time, she has to take care of her grandchildren and she is tired and not happy with disrespectfulness developed over the time. She needs her own space and time. She cries as she speaking.    OBJECTIVE Temp:  [98.2 F (36.8 C)-98.6 F (37 C)] 98.2 F (36.8 C) (05/17 0840) Pulse Rate:  [65-89] 82 (05/17 0840) Cardiac Rhythm: Normal sinus  rhythm (05/17 0700) Resp:  [11-20] 20 (05/17 0645) BP: (100-146)/(40-85) 107/56 (05/17 0840) SpO2:  [92 %-99 %] 97 % (05/17 0840) Weight:  [104.3 kg (229 lb 15 oz)-104.3 kg (230 lb)] 104.3 kg (229 lb 15 oz) (05/17 0447)  CBC:  Recent Labs Lab 09/01/16 1454 09/02/16 0038 09/02/16 0043  WBC 8.4 9.2  --   NEUTROABS  --  5.2  --   HGB 10.2* 10.2* 11.6*  HCT 32.6* 32.9* 34.0*  MCV 77.4* 78.3  --   PLT 339 321  --     Basic Metabolic Panel:  Recent Labs Lab 09/01/16 1454 09/02/16 0038 09/02/16 0043  NA 132* 134* 137  K 5.1 5.0 4.9  CL 100* 103 103  CO2 24 25  --   GLUCOSE 330* 332* 321*  BUN 21* 22* 24*  CREATININE 1.09* 1.39* 1.40*  CALCIUM 9.4 9.2  --     Lipid Panel:    Component Value Date/Time   CHOL 137 09/02/2016 0533   CHOL 167 07/14/2016 1028   TRIG 67 09/02/2016 0533   HDL 61 09/02/2016 0533   HDL 71 07/14/2016 1028   CHOLHDL 2.2 09/02/2016 0533   VLDL 13 09/02/2016 0533   LDLCALC 63 09/02/2016 0533   LDLCALC 77 07/14/2016 1028   HgbA1c:  Lab Results  Component Value Date   HGBA1C 13.3 07/14/2016   Urine Drug Screen:    Component Value Date/Time   LABOPIA NONE DETECTED 01/02/2014 0410  COCAINSCRNUR NONE DETECTED 01/02/2014 0410   LABBENZ NONE DETECTED 01/02/2014 0410   AMPHETMU NONE DETECTED 01/02/2014 0410   THCU NONE DETECTED 01/02/2014 0410   LABBARB NONE DETECTED 01/02/2014 0410    Alcohol Level     Component Value Date/Time   ETH <5 09/02/2016 0038    IMAGING I have personally reviewed the radiological images below and agree with the radiology interpretations.  Ct Head Code Stroke W/o Cm 09/02/2016 1. No acute abnormality. 2. ASPECTS is 10.   Mr Brain Wo Contrast 09/02/2016 1. No acute intracranial abnormality. 2. Findings of mild chronic microvascular ischemia.   Mr Jodene Nam Head/brain Wo Cm 09/02/2016 Normal intracranial MRA.   Carotid Doppler   07/15/2016 There is 1-39% bilateral ICA stenosis. Vertebral artery flow is antegrade.    Dg Chest 2 View 09/01/2016 No active cardiopulmonary disease.   Ct Angio Chest/abd/pel For Dissection W And/or Wo Contrast 09/02/2016  1. No aortic dissection or acute aortic syndrome. Abdominal aortic atherosclerosis. 2. Faint clustered nodularity in the right upper lung, likely bronchiolitis, infectious or inflammatory. 3. Incidental findings in the abdomen include right adrenal adenoma and colonic diverticulosis without acute inflammation.   TTE pending   PHYSICAL EXAM  Temp:  [97.7 F (36.5 C)-98.6 F (37 C)] 97.7 F (36.5 C) (05/17 1217) Pulse Rate:  [65-89] 70 (05/17 1217) Resp:  [11-20] 20 (05/17 0645) BP: (100-146)/(40-85) 128/73 (05/17 1217) SpO2:  [92 %-99 %] 98 % (05/17 1217) Weight:  [229 lb 15 oz (104.3 kg)-230 lb (104.3 kg)] 229 lb 15 oz (104.3 kg) (05/17 0447)  General - morbid obesity, well developed, in no apparent distress.  Ophthalmologic - Fundi not visualized due to noncooperative.  Cardiovascular - Regular rate and rhythm.  Mental Status -  Level of arousal and orientation to time, place, and person were intact. Language including expression, naming, repetition, comprehension was assessed and found intact. Fund of Knowledge was assessed and was intact.  Cranial Nerves II - XII - II - Visual field intact OU. III, IV, VI - Extraocular movements intact. V - Facial sensation intact bilaterally. VII - Facial movement intact bilaterally. VIII - Hearing & vestibular intact bilaterally. X - Palate elevates symmetrically. XI - Chin turning & shoulder shrug intact bilaterally. XII - Tongue protrusion intact.  Motor Strength - The patient's strength was normal in all extremities and pronator drift was absent.  Bulk was normal and fasciculations were absent.   Motor Tone - Muscle tone was assessed at the neck and appendages and was normal.  Reflexes - The patient's reflexes were 1+ in all extremities and she had no pathological reflexes.  Sensory - Light  touch, temperature/pinprick were assessed and were symmetrical.    Coordination - The patient had normal movements in the hands and feet with no ataxia or dysmetria.  Tremor was absent.  Gait and Station - deferred   ASSESSMENT/PLAN Ms. Deborah Coffey is a 61 y.o. 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 She did not receive IV t-PA due to rapidly improving symptoms.   Left facial, neck, shoulder and arm pain - likely related to anxiety and stress at home vs. Radiating pain due to chest pain.   Code Stroke CT no acute stroke. Aspects 10.    MRI head no acute stroke. Small vessel disease.   MRA head Unremarkable   Carotid Doppler  07/15/16 B ICA 1-39% stenosis, VAs antegrade -  no need to repeat this time  2D Echo  pending   UDS pending  LDL 63  HgbA1c 13.3 in March  Lovenox 40 mg sq daily for VTE prophylaxis  Diet Carb Modified Fluid consistency: Thin; Room service appropriate? Yes  aspirin 81 mg daily prior to admission, now on aspirin 325 mg daily. Continue ASA on discharge.  Patient counseled to be compliant with her antithrombotic medications  Ongoing aggressive stroke risk factor management  Therapy recommendations:  pending   Disposition:  Anticipate return home  Stress at home - may consider SW help as outpt  Hypertension  Stable  Continue home meds  Hyperlipidemia  Home meds:  lipitor 10, resumed in hospital  LDL 63, goal < 70  Continue statin at discharge  Diabetes type II  HgbA1c 13.3 in March, goal < 7.0  Uncontrolled  On lantus  Need DM education again  SSI  Tobacco abuse  Current smoker  Smoking cessation counseling provided  Pt is willing to quit  Other Stroke Risk Factors  ETOH use, advised to drink no more than 1 drink(s) a day. 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 Followed by Dr. Tomi Likens as OP  Coronary artery disease s/p CABG - on ASA  Hospital day # 0  Neurology will sign off. Please call with questions. Pt will follow up with Dr. Tomi Likens at [facility] as needed. Thanks for the consult.  Rosalin Hawking, MD PhD Stroke Neurology 09/02/2016 12:52 PM  To contact Stroke Continuity provider, please refer to [email or web address] After hours, contact General Neurology

## 2016-09-02 NOTE — Care Management Note (Signed)
Case Management Note  Patient Details  Name: Kylina Vultaggio MRN: [id number] Date of Birth: [birth year]  Subjective/Objective:   Pt admitted for TIA. 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 She is from home.                 Action/Plan: Awaiting PT/OT recommendations. CM following for d/c needs, physician orders.   Expected Discharge Date:                  Expected Discharge Plan:     In-House Referral:     Discharge planning Services     Post Acute Care Choice:    Choice offered to:     DME Arranged:    DME Agency:     HH Arranged:    HH Agency:     Status of Service:  In process, will continue to follow  If discussed at Long Length of Stay Meetings, dates discussed:    Additional Comments:  Pollie Friar, RN 09/02/2016, 12:48 PM

## 2016-09-02 NOTE — Consult Note (Signed)
Admission H&P    Chief Complaint: Weakness and numbness involving left face and upper extremity.  HPI: Deborah Coffey is an 61 y.o. female history diabetes mellitus, coronary artery disease with CABG, hypertension and new-onset weakness and numbness involving left face and left upper extremity. Patient was last known well at about 7:00 PM. She was again seen by her daughter at 11:18 PM and had left facial droop and slurred speech as well as complaint of numbness involving face and left upper extremity. She had been seen in the ED earlier today for chest pain and had left at around 7 PM prior to being clinically evaluated. She is still complaining of chest pain. CT scan of her head showed no acute intracranial abnormality. She has a history of TIA and is on aspirin daily. NIH stroke score is time this evaluation was 2. Deficits had improved significantly.  LSN: 7:00 PM on 09/01/2016 tPA Given: No: Rapidly improving deficits mRankin:  Past Medical History:  Diagnosis Date  . Allergy   . Anemia   . CAD in native artery 02/2004   Mid LAD lesoin just after Large D1 --> CABG x 1 LIMA-LAD  . CAP (community acquired pneumonia) 08/07/2014  . Chronic bronchitis ([place])    "I think I get it q yr" (08/07/2014)  . Depression   . High cholesterol   . History of blood transfusion 2005   "related to [facility]"   . Hypertension   . 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 competitive flow  . Type II diabetes mellitus ([place])     Past Surgical History:  Procedure Laterality Date  . CARDIAC CATHETERIZATION  2010   Previous LAD 95% lesion - now ~30-40%; patent LIMA with competitive flow  . CESAREAN SECTION  1977; 1989  . CHOLECYSTECTOMY  ~ 2012  . CORONARY ARTERY BYPASS GRAFT  02/2004   "CABG X1" (03/22/2013); LIMA-LAD  . TUBAL LIGATION  1989    Family History  Problem Relation Age of Onset  . Heart disease Mother   . Diabetes Mother   . Cancer Mother       cervical  . Diabetes Father   . Lupus Sister   . Heart failure Sister   . Hepatitis C Sister   . Heart failure Brother   . Diabetes Brother   . Hypertension Daughter   . Colon cancer Neg Hx    Social History:  reports that she has been smoking Cigarettes.  She has a 21.00 pack-year smoking history. She has never used smokeless tobacco. She reports that she drinks about 0.6 - 1.2 oz of alcohol per week . She reports that she does not use drugs.  Allergies:  Allergies  Allergen Reactions  . Ciprofloxacin Rash    Medications: Preadmission medications were reviewed by me.  ROS: History obtained from patient's daughter.  General ROS: negative for - chills, fatigue, fever, night sweats, weight gain or weight loss Psychological ROS: negative for - behavioral disorder, hallucinations, memory difficulties, mood swings or suicidal ideation Ophthalmic ROS: negative for - blurry vision, double vision, eye pain or loss of vision ENT ROS: negative for - epistaxis, nasal discharge, oral lesions, sore throat, tinnitus or vertigo Allergy and Immunology ROS: negative for - hives or itchy/watery eyes Hematological and Lymphatic ROS: negative for - bleeding problems, bruising or swollen lymph nodes Endocrine ROS: negative for - galactorrhea, hair pattern changes, polydipsia/polyuria or temperature intolerance Respiratory ROS: negative for - cough,  hemoptysis, shortness of breath or wheezing Cardiovascular ROS: Chest pain, as noted in present illness Gastrointestinal ROS: negative for - abdominal pain, diarrhea, hematemesis, nausea/vomiting or stool incontinence Genito-Urinary ROS: negative for - dysuria, hematuria, incontinence or urinary frequency/urgency Musculoskeletal ROS: negative for - joint swelling or muscular weakness Neurological ROS: as noted in HPI Dermatological ROS: negative for rash and skin lesion changes  Physical Examination: Blood pressure (!) 118/40, pulse 89, temperature  98.5 F (36.9 C), temperature source Axillary, resp. rate 18, SpO2 97 %.  HEENT-  Normocephalic, no lesions, without obvious abnormality.  Normal external eye and conjunctiva.  Normal TM's bilaterally.  Normal auditory canals and external ears. Normal external nose, mucus membranes and septum.  Normal pharynx. Neck supple with no masses, nodes, nodules or enlargement. Cardiovascular - regular rate and rhythm, S1, S2 normal, no murmur, click, rub or gallop Lungs - chest clear, no wheezing, rales, normal symmetric air entry Abdomen - soft, non-tender; bowel sounds normal; no masses,  no organomegaly Extremities - no joint deformities, effusion, or inflammation  Neurologic Examination: Mental Status: Lethargic, oriented to current age and month, complaining of chest pain.  Speech fluent without evidence of aphasia. Able to follow commands without difficulty. Cranial Nerves: II-Visual fields were normal. III/IV/VI-Pupils were equal and reacted. Extraocular movements were full and conjugate.    V/VII-reduced perception of tactile sensation over left side of the face compared to the right; no facial weakness. VIII-normal. X-normal speech and symmetrical palatal movement. XI: trapezius strength/neck flexion strength normal bilaterally XII-midline tongue extension with normal strength. Motor: 5/5 bilaterally with normal tone and bulk; no drift of any extremities. Sensory: Reduced perception of tactile sensation over left extremities compared to right extremities. Deep Tendon Reflexes: 1+ and symmetric. Plantars: Flexor bilaterally Carotid auscultation: Normal  Results for orders placed or performed during the hospital encounter of 09/02/16 (from the past 48 hour(s))  CBG monitoring, ED     Status: Abnormal   Collection Time: 09/02/16 12:42 AM  Result Value Ref Range   Glucose-Capillary 336 (H) 65 - 99 mg/dL  I-Stat Chem 8, ED  (not at [facility] [place])     Status: Abnormal   Collection Time:  09/02/16 12:43 AM  Result Value Ref Range   Sodium 137 135 - 145 mmol/L   Potassium 4.9 3.5 - 5.1 mmol/L   Chloride 103 101 - 111 mmol/L   BUN 24 (H) 6 - 20 mg/dL   Creatinine, Ser 1.40 (H) 0.44 - 1.00 mg/dL   Glucose, Bld 321 (H) 65 - 99 mg/dL   Calcium, Ion 1.09 (L) 1.15 - 1.40 mmol/L   TCO2 26 0 - 100 mmol/L   Hemoglobin 11.6 (L) 12.0 - 15.0 g/dL   HCT 34.0 (L) 36.0 - 46.0 %   Dg Chest 2 View  Result Date: 09/01/2016 CLINICAL DATA:  Chest pain EXAM: CHEST  2 VIEW COMPARISON:  06/15/2015 FINDINGS: Post sternotomy changes. No acute consolidation or effusion. Cardiomediastinal silhouette within normal limits. No pneumothorax. Degenerative changes of the spine. IMPRESSION: No active cardiopulmonary disease. Electronically Signed   By: Donavan Foil M.D.   On: 09/01/2016 15:51   Ct Head Code Stroke W/o Cm  Result Date: 09/02/2016 CLINICAL DATA:  Code stroke.  Left-sided facial droop EXAM: CT HEAD WITHOUT CONTRAST TECHNIQUE: Contiguous axial images were obtained from the base of the skull through the vertex without intravenous contrast. COMPARISON:  Brain MRI 01/01/2014 FINDINGS: Brain: No mass lesion, intraparenchymal hemorrhage or extra-axial collection. No evidence of acute cortical infarct. Brain  parenchyma and CSF-containing spaces are normal for age. Partially empty sella, unchanged. Vascular: No hyperdense vessel or unexpected calcification. Skull: Normal visualized skull base, calvarium and extracranial soft tissues. Sinuses/Orbits: No sinus fluid levels or advanced mucosal thickening. No mastoid effusion. Normal orbits. ASPECTS [facility] Stroke Program Early CT Score) - Ganglionic level infarction (caudate, lentiform nuclei, internal capsule, insula, M1-M3 cortex): 7 - Supraganglionic infarction (M4-M6 cortex): 3 Total score (0-10 with 10 being normal): 10 IMPRESSION: 1. No acute abnormality. 2. ASPECTS is 10. These results were called by telephone at the time of interpretation on 09/02/2016  at 12:44 am to Dr. Wallie Char, who verbally acknowledged these results. Electronically Signed   By: Ulyses Jarred M.D.   On: 09/02/2016 00:45    Assessment: 61 y.o. female with multiple risk factors for stroke presenting with probable transient ischemic attack. However, a right subcortical small vessel ischemic infarction cannot be ruled out at this point.  Stroke Risk Factors - diabetes mellitus, hyperlipidemia and hypertension  Plan: 1. HgbA1c, fasting lipid panel 2. MRI, MRA  of the brain without contrast 3. PT consult, OT consult, Speech consult 4. Echocardiogram 5. Carotid dopplers 6. Prophylactic therapy-Antiplatelet med: Aspirin  7. Risk factor modification 8. Telemetry monitoring  C.R. Nicole Kindred, MD [facility] [phone]  09/02/2016, 12:50 AM

## 2016-09-02 NOTE — Progress Notes (Signed)
Normal Carotid Duplex 07/15/16. Please advise if repeat is needed.  Vascular lab [place], RDMS, RVT

## 2016-09-02 NOTE — Evaluation (Signed)
Occupational Therapy Evaluation and Discharge Patient Details Name: Deborah Coffey MRN: [id number] DOB: April 18, 1956 Today's Date: 09/02/2016    History of Present Illness 61 y.o. female with medical history significant of TIA vs Conversion disorder back in Aug 2015.  CAD s/p CABG.  Uncontrolled DM, HTN, HLD.  She presented to the ED with c/o L facial droop and LUE numbness. MRI negative.   Clinical Impression   PTA pt independent in ADL/IADL and mobility. Pt lethargic this session, but willing to participate in evaluation. Pt currently at supervision level for ADL and functional transfers due to lethargy. Please see performance level below. Pt with decreased static standing balance, and OT recommending shower chair to prevent falls in the home. OT education complete, no further questions or concerns from Pt or Family. OT to sign off at this time. Thank you for this referral.    Follow Up Recommendations  No OT follow up;Supervision - Intermittent    Equipment Recommendations  Shower chair   Recommendations for Other Services       Precautions / Restrictions Precautions Precautions: None Restrictions Weight Bearing Restrictions: No      Mobility Bed Mobility Overal bed mobility: Modified Independent             General bed mobility comments: +rail, increased time to complete  Transfers Overall transfer level: Needs assistance Equipment used: None Transfers: Sit to/from Stand Sit to Stand: Supervision Stand pivot transfers: Supervision       General transfer comment: supervision for safety due to initial lethargy    Balance Overall balance assessment: Needs assistance Sitting-balance support: No upper extremity supported;Feet supported Sitting balance-Leahy Scale: Normal     Standing balance support: No upper extremity supported;During functional activity Standing balance-Leahy Scale: Fair Standing balance comment: swaying with static standing without  support                           ADL either performed or assessed with clinical judgement   ADL Overall ADL's : Needs assistance/impaired Eating/Feeding: Modified independent;Sitting   Grooming: Wash/dry hands;Oral care;Supervision/safety;Standing Grooming Details (indicate cue type and reason): Pt able to find grooming items in "bucket" and complete tasks without physical or verbal assist, Pt able to don/doff socks sitting EOB, and Pt able to ambulate to bathroom. Pt sways when static standing and not leaning on surface - supervision for safety Upper Body Bathing: Supervision/ safety;Sitting   Lower Body Bathing: Supervison/ safety;Sitting/lateral leans   Upper Body Dressing : Modified independent;Sitting   Lower Body Dressing: Supervision/safety;Sitting/lateral leans Lower Body Dressing Details (indicate cue type and reason): able to don/doff socks sitting EOB Toilet Transfer: Min guard;Ambulation;Regular Toilet   Toileting- Water quality scientist and Hygiene: Supervision/safety;Sit to/from stand Toileting - Clothing Manipulation Details (indicate cue type and reason): hospital gown and peri care Tub/ Shower Transfer: Min guard   Functional mobility during ADLs: Min guard       Vision Baseline Vision/History: No visual deficits Patient Visual Report: No change from baseline Vision Assessment?: Yes Eye Alignment: Within Functional Limits Ocular Range of Motion: Within Functional Limits Alignment/Gaze Preference: Within Defined Limits Tracking/Visual Pursuits: Able to track stimulus in all quads without difficulty Convergence: Within functional limits Visual Fields: No apparent deficits Additional Comments: Able to read from menu without problem     Perception     Praxis      Pertinent Vitals/Pain Pain Assessment: No/denies pain     Hand Dominance Right   Extremity/Trunk Assessment Upper Extremity  Assessment Upper Extremity Assessment: Overall WFL for  tasks assessed (Pt denies tingling or numbness at this time)   Lower Extremity Assessment Lower Extremity Assessment: Defer to PT evaluation   Cervical / Trunk Assessment Cervical / Trunk Assessment: Normal   Communication Communication Communication: No difficulties   Cognition Arousal/Alertness: Lethargic Behavior During Therapy: WFL for tasks assessed/performed Overall Cognitive Status: Within Functional Limits for tasks assessed                                 General Comments: Pt was more aroused as she participated in therapy session   General Comments  Pt's daughter and grandson came in at the end of the session    Exercises     Shoulder Instructions      Home Living Family/patient expects to be discharged to:: Private residence Living Arrangements: Children Available Help at Discharge: Family;Available PRN/intermittently Type of Home: House Home Access: Stairs to enter [employer] of Steps: 1   Home Layout: One level     Bathroom Shower/Tub: Corporate investment banker: Standard Bathroom Accessibility: Yes How Accessible: Accessible via walker Home Equipment: None          Prior Functioning/Environment Level of Independence: Independent                 OT Problem List: Impaired balance (sitting and/or standing);Decreased activity tolerance;Obesity      OT Treatment/Interventions:      OT Goals(Current goals can be found in the care plan section) Acute Rehab OT Goals Patient Stated Goal: home OT Goal Formulation: With patient Time For Goal Achievement: 09/16/16 Potential to Achieve Goals: Good  OT Frequency:     Barriers to D/C:            Co-evaluation              AM-PAC PT "6 Clicks" Daily Activity     Outcome Measure Help from another person eating meals?: None Help from another person taking care of personal grooming?: None Help from another person toileting, which includes using  toliet, bedpan, or urinal?: None Help from another person bathing (including washing, rinsing, drying)?: A Little Help from another person to put on and taking off regular upper body clothing?: None Help from another person to put on and taking off regular lower body clothing?: None 6 Click Score: 23   End of Session Equipment Utilized During Treatment: Gait belt Nurse Communication: Mobility status  Activity Tolerance: Patient tolerated treatment well Patient left: in bed;with call bell/phone within reach;with bed alarm set;Other (comment) (sitting EOB)  OT Visit Diagnosis: Unsteadiness on feet (R26.81);Muscle weakness (generalized) (M62.81)                Time: 0932-6712 OT Time Calculation (min): 24 min Charges:  OT General Charges $OT Visit: 1 Procedure OT Evaluation $OT Eval Moderate Complexity: 1 Procedure OT Treatments $Self Care/Home Management : 8-22 mins G-Codes: OT G-codes **NOT FOR INPATIENT CLASS** Functional Assessment Tool Used: AM-PAC 6 Clicks Daily Activity Functional Limitation: Self care Self Care Current Status (W5809): At least 1 percent but less than 20 percent impaired, limited or restricted Self Care Goal Status (X8338): 0 percent impaired, limited or restricted Self Care Discharge Status [phone]): At least 1 percent but less than 20 percent impaired, limited or restricted   Hulda Humphrey OTR/L [place] 09/02/2016, 4:00 PM

## 2016-09-02 NOTE — Evaluation (Signed)
Physical Therapy Evaluation Patient Details Name: Deborah Coffey MRN: [id number] DOB: 08-Aug-1955 Today's Date: 09/02/2016   History of Present Illness  61 y.o. female with medical history significant of TIA vs Conversion disorder back in Aug 2015.  CAD s/p CABG.  Uncontrolled DM, HTN, HLD.  She presented to the ED with c/o L facial droop and LUE numbness. MRI negative.  Clinical Impression  Pt admitted with above diagnosis. Pt currently with functional limitations due to the deficits listed below (see PT Problem List). Pt lethargic on eval from ativan given for MRI. Supervision provided for transfers and min guard assist ambulation 180 feet without AD. Mobility/safety impacted by lethargy. PT to follow acutely to ensure safety and independence for d/c home. No follow up services indicated.      Follow Up Recommendations No PT follow up;Supervision - Intermittent    Equipment Recommendations  None recommended by PT    Recommendations for Other Services       Precautions / Restrictions Precautions Precautions: None      Mobility  Bed Mobility Overal bed mobility: Modified Independent             General bed mobility comments: +rail, increased time to complete  Transfers Overall transfer level: Needs assistance Equipment used: None Transfers: Sit to/from Stand;Stand Pivot Transfers Sit to Stand: Supervision Stand pivot transfers: Supervision       General transfer comment: supervision for safety due to pt groggy from meds  Ambulation/Gait Ambulation/Gait assistance: Min guard Ambulation Distance (Feet): 180 Feet Assistive device: None Gait Pattern/deviations: Step-through pattern;Decreased stride length;Wide base of support Gait velocity: decreased Gait velocity interpretation: Below normal speed for age/gender General Gait Details: slow, steady gait  Stairs            Wheelchair Mobility    Modified Rankin (Stroke Patients Only) Modified Rankin  (Stroke Patients Only) Pre-Morbid Rankin Score: No significant disability Modified Rankin: Slight disability     Balance Overall balance assessment: Needs assistance Sitting-balance support: No upper extremity supported;Feet supported Sitting balance-Leahy Scale: Normal     Standing balance support: No upper extremity supported;During functional activity Standing balance-Leahy Scale: Good                               Pertinent Vitals/Pain Pain Assessment: No/denies pain    Home Living Family/patient expects to be discharged to:: Private residence Living Arrangements: Children Available Help at Discharge: Family;Available PRN/intermittently Type of Home: House Home Access: Stairs to enter   [employer] of Steps: 1 Home Layout: One level Home Equipment: None      Prior Function Level of Independence: Independent               Hand Dominance   Dominant Hand: Right    Extremity/Trunk Assessment   Upper Extremity Assessment Upper Extremity Assessment: Defer to OT evaluation    Lower Extremity Assessment Lower Extremity Assessment: Overall WFL for tasks assessed (appear symmetrical)    Cervical / Trunk Assessment Cervical / Trunk Assessment: Normal  Communication   Communication: No difficulties  Cognition Arousal/Alertness: Lethargic;Suspect due to medications (ativan given for MRI last night) Behavior During Therapy: WFL for tasks assessed/performed Overall Cognitive Status: Within Functional Limits for tasks assessed  General Comments      Exercises     Assessment/Plan    PT Assessment Patient needs continued PT services  PT Problem List Decreased activity tolerance;Decreased mobility;Decreased balance       PT Treatment Interventions Gait training;Stair training;Functional mobility training;Therapeutic activities;Therapeutic exercise;Balance training;Patient/family  education    PT Goals (Current goals can be found in the Care Plan section)  Acute Rehab PT Goals Patient Stated Goal: home PT Goal Formulation: With patient Time For Goal Achievement: 09/16/16 Potential to Achieve Goals: Good    Frequency Min 3X/week   Barriers to discharge        Co-evaluation               AM-PAC PT "6 Clicks" Daily Activity  Outcome Measure Difficulty turning over in bed (including adjusting bedclothes, sheets and blankets)?: None Difficulty moving from lying on back to sitting on the side of the bed? : A Little Difficulty sitting down on and standing up from a chair with arms (e.g., wheelchair, bedside commode, etc,.)?: A Little Help needed moving to and from a bed to chair (including a wheelchair)?: A Little Help needed walking in hospital room?: A Little Help needed climbing 3-5 steps with a railing? : A Little 6 Click Score: 19    End of Session Equipment Utilized During Treatment: Gait belt Activity Tolerance: Patient limited by lethargy Patient left: in bed;with call bell/phone within reach;with family/visitor present Nurse Communication: Mobility status PT Visit Diagnosis: Unsteadiness on feet (R26.81)    Time: 3500-9381 PT Time Calculation (min) (ACUTE ONLY): 13 min   Charges:   PT Evaluation $PT Eval Moderate Complexity: 1 Procedure     PT G Codes:   PT G-Codes **NOT FOR INPATIENT CLASS** Functional Assessment Tool Used: AM-PAC 6 Clicks Basic Mobility Functional Limitation: Mobility: Walking and moving around Mobility: Walking and Moving Around Current Status (W2993): At least 20 percent but less than 40 percent impaired, limited or restricted Mobility: Walking and Moving Around Goal Status [phone]): At least 1 percent but less than 20 percent impaired, limited or restricted    Lorrin Goodell, PT  Office # [phone] Pager [phone]   Lorriane Shire 09/02/2016, 1:11 PM

## 2016-09-02 NOTE — Code Documentation (Signed)
Code stroke called at [facility].  LKW 1900 on 09/01/16.  At 2318, pt's daughter reports that the pt had slurred speech, facial and left upper extremity numbness and left facial droop.  Pt was then taken to ED by private vehicle and arrived at 0013.  CT head showed no acute findings.  On exam, pt responds to voice and appears lethargic. She follows commands and answers questions appropriately.  Sensation was decreased on the left upper and lower extremities. No visual field deficits. No ataxia.  Left facial droop had resolved at time of NIHSS.  Left arm and leg noted to be weaker than the right side, however, no drift was present when tested.  No dysarthria or aphasia.  NIHSS 2.    Pt to be admitted for TIA workup.

## 2016-09-02 NOTE — Progress Notes (Signed)
Patient re-evaluated at bedside for increased drowsiness.  MAE, no obvious focal deficits.  Will wake up and follow limited commands with repeated stimulation.  MRI reviewed but DWI doesn't appear to show any large volume stroke.  At this point suspect patient over sedated from the 1mg  IV ativan that was given.

## 2016-09-02 NOTE — Progress Notes (Addendum)
Pt NIH now 12, MD made aware of change and that patient was given ativan for MRI.  Patient remains very drowsy, following limited commands with repeated stimulation.  No new orders received at this time.  Continue to monitor patient.   MD at bedside 0520, CBG checked per MD request--230.  Continue to monitor patient.

## 2016-09-03 ENCOUNTER — Encounter (HOSPITAL_COMMUNITY): Payer: Self-pay | Admitting: *Deleted

## 2016-09-03 ENCOUNTER — Observation Stay (HOSPITAL_BASED_OUTPATIENT_CLINIC_OR_DEPARTMENT_OTHER): Payer: Medicare HMO

## 2016-09-03 DIAGNOSIS — F419 Anxiety disorder, unspecified: Secondary | ICD-10-CM | POA: Diagnosis not present

## 2016-09-03 DIAGNOSIS — F449 Dissociative and conversion disorder, unspecified: Secondary | ICD-10-CM | POA: Diagnosis not present

## 2016-09-03 DIAGNOSIS — E1165 Type 2 diabetes mellitus with hyperglycemia: Secondary | ICD-10-CM | POA: Diagnosis not present

## 2016-09-03 DIAGNOSIS — Z794 Long term (current) use of insulin: Secondary | ICD-10-CM

## 2016-09-03 DIAGNOSIS — R200 Anesthesia of skin: Secondary | ICD-10-CM | POA: Diagnosis not present

## 2016-09-03 DIAGNOSIS — I360 Nonrheumatic tricuspid (valve) stenosis: Secondary | ICD-10-CM | POA: Diagnosis not present

## 2016-09-03 DIAGNOSIS — E1141 Type 2 diabetes mellitus with diabetic mononeuropathy: Secondary | ICD-10-CM | POA: Diagnosis not present

## 2016-09-03 DIAGNOSIS — R0789 Other chest pain: Secondary | ICD-10-CM | POA: Diagnosis not present

## 2016-09-03 DIAGNOSIS — I5032 Chronic diastolic (congestive) heart failure: Secondary | ICD-10-CM

## 2016-09-03 DIAGNOSIS — I10 Essential (primary) hypertension: Secondary | ICD-10-CM | POA: Diagnosis not present

## 2016-09-03 LAB — ECHOCARDIOGRAM COMPLETE
CHL CUP MV DEC (S): 259
CHL CUP TV REG PEAK VELOCITY: 343 cm/s
E/e' ratio: 7.63
EWDT: 259 ms
FS: 25 % — AB (ref 28–44)
HEIGHTINCHES: 68 in
IV/PV OW: 0.81
LA diam end sys: 39 mm
LA diam index: 1.8 cm/m2
LA vol A4C: 47.5 ml
LA vol: 53.4 mL
LASIZE: 39 mm
LAVOLIN: 24.6 mL/m2
LV E/e' medial: 7.63
LV TDI E'MEDIAL: 7.88
LV e' LATERAL: 11.5 cm/s
LVEEAVG: 7.63
LVOT SV: 69 mL
LVOT VTI: 22 cm
LVOT area: 3.14 cm2
LVOTD: 20 mm
LVOTPV: 79.1 cm/s
Lateral S' vel: 7.15 cm/s
MV Peak grad: 3 mmHg
MV pk A vel: 85.9 m/s
MVPKEVEL: 87.8 m/s
PW: 11.9 mm — AB (ref 0.6–1.1)
RV sys press: 50 mmHg
TAPSE: 18.8 mm
TDI e' lateral: 11.5
TR max vel: 343 cm/s
WEIGHTICAEL: 3679.04 [oz_av]

## 2016-09-03 LAB — GLUCOSE, CAPILLARY
Glucose-Capillary: 310 mg/dL — ABNORMAL HIGH (ref 65–99)
Glucose-Capillary: 177 mg/dL — ABNORMAL HIGH (ref 65–99)

## 2016-09-03 LAB — HEMOGLOBIN A1C
HEMOGLOBIN A1C: 13.1 % — AB (ref 4.8–5.6)
MEAN PLASMA GLUCOSE: 329 mg/dL

## 2016-09-03 MED ORDER — POLYETHYLENE GLYCOL 3350 17 G PO PACK
17.0000 g | PACK | Freq: Every day | ORAL | Status: DC | PRN
  Administered 2016-09-03: 17 g via ORAL

## 2016-09-03 NOTE — Discharge Summary (Signed)
Physician Discharge Summary  Deborah Coffey XTK:[id number] DOB: June 18, 1955 DOA: 09/02/2016  PCP: Tresa Garter, MD  Admit date: 09/02/2016 Discharge date: 09/03/2016  Admitted From: Home Disposition: Home  Recommendations for Outpatient Follow-up:  1. Follow up with PCP in 1 week 2. Follow up with neurology 3. Follow up with cardiology 4. Repeat BMP to recheck kidney function  Home Health: None Equipment/Devices: None  Discharge Condition: Stable CODE STATUS: Full code Diet recommendation: Heart healthy   Brief/Interim Summary:  Admission HPI written by Etta Quill, DO   Chief Complaint: Facial droop  HPI: Deborah Coffey is a 61 y.o. female with medical history significant of TIA vs Conversion disorder back in Aug 2015.  CAD s/p CABG.  Uncontrolled DM, HTN, HLD.  Patient presents to the ED as a code stroke.  L Sided facial droop and L sided weakness noted.  Patient came to ED about 5 hours ago for CP but left without being seen.  Noticed she had a "twisted mouth" with speech difficulty and confusion about 4 hours ago.  Symptoms persisted and patient came in to ED as a code stroke.  Of note please see admission back in Sept 2015 with similar symptoms.   ED Course: Facial droop has resolved at this point.  EDP states patients facial findings looked like the Sept 2015 images in last progress note during admission.    Hospital course:  Left arm pain/facial numbness Initially thought to be a TIA. MRI/MRA negative for evidence of stroke. Stroke team assessed and feels this is likely secondary to stress. Troponin negative x3 and EKG does not suggest ACS. LDL was within goal. No changes made to aspirin or statin therapy. Follow-up with neurologist as an outpatient.  Diabetes mellitus, type 2 Uncontrolled. A1C of 13.1. Continued home regimen with sliding scale insulin. Will likely need titration of insulin as an outpatient.  Acute kidney injury Baseline of 0.9.  Up to 1.4. Patient tolerating by mouth. Encourage oral intake. Follow-up BMP as outpatient to ensure resolution. Hold lasix on discharge.  Essential hypertension Continued metoprolol and hydrochlorothiazide.  Pulmonary artery hypertension Peak pressure of 50 mmHg. Likely secondary to lung disease from chronic tobacco use. Outpatient follow-up.  CAD S/p CABG. Continue aspirin and atorvastatin  Chronic diastolic heart failure EF of 55-60% with grade 1 diastolic dysfunction on 9/92/42 echocardiogram. Euvolemic.    Discharge Diagnoses:  Principal Problem:   TIA (transient ischemic attack) Active Problems:   Accelerated hypertension   CAD (coronary artery disease) - CABG x 1 (LIMA-LAD)   Conversion disorder   Diabetic mononeuropathy associated with type 2 diabetes mellitus ([place])   Anxiety    Discharge Instructions  Discharge Instructions    Call MD for:  difficulty breathing, headache or visual disturbances    Complete by:  As directed    Call MD for:  persistant dizziness or light-headedness    Complete by:  As directed    Call MD for:  persistant nausea and vomiting    Complete by:  As directed    Call MD for:  temperature >100.4    Complete by:  As directed    Diet - low sodium heart healthy    Complete by:  As directed    Increase activity slowly    Complete by:  As directed      Allergies as of 09/03/2016      Reactions   Ciprofloxacin Rash      Medication List    STOP taking these medications  furosemide 20 MG tablet Commonly known as:  LASIX     TAKE these medications   ACCU-CHEK FASTCLIX LANCETS Misc Use as directed to check blood glucose 3 times daily. E11.9   ACCU-CHEK NANO SMARTVIEW w/Device Kit Use as directed to check blood glucose 3 times daily. E11.9   aspirin EC 81 MG tablet Take 81 mg by mouth daily.   atorvastatin 10 MG tablet Commonly known as:  LIPITOR Take 1 tablet (10 mg total) by mouth daily.   B-D SINGLE USE SWABS REGULAR  Pads Use as directed   gabapentin 300 MG capsule Commonly known as:  NEURONTIN Take 1 capsule (300 mg total) by mouth 3 (three) times daily.   glucose blood test strip Commonly known as:  ACCU-CHEK SMARTVIEW Use as instructed to test blood glucose 3 times daily. E11.9   hydrochlorothiazide 12.5 MG capsule Commonly known as:  MICROZIDE Take 1 capsule (12.5 mg total) by mouth daily.   Insulin Glargine 300 UNIT/ML Sopn Commonly known as:  TOUJEO SOLOSTAR Inject 35 Units into the skin daily with breakfast.   lisinopril 10 MG tablet Commonly known as:  PRINIVIL,ZESTRIL Take 1 tablet (10 mg total) by mouth daily.   metFORMIN 500 MG 24 hr tablet Commonly known as:  GLUCOPHAGE-XR TAKE 2 TABLETS BY MOUTH 2 TIMES DAILY WITH A MEAL   metoprolol tartrate 50 MG tablet Commonly known as:  LOPRESSOR Take 1 tablet (50 mg total) by mouth 2 (two) times daily.   multivitamin with minerals Tabs tablet Take 1 tablet by mouth daily.   nitroGLYCERIN 0.4 MG SL tablet Commonly known as:  NITROSTAT Place 1 tablet (0.4 mg total) under the tongue every 5 (five) minutes as needed for chest pain.   pantoprazole 40 MG tablet Commonly known as:  PROTONIX Take 1 tablet (40 mg total) by mouth daily.      Follow-up Information    Pieter Partridge, DO Follow up.   Specialty:  Neurology Why:  as needed.  Contact information: [address] [place] [id number] [phone]        Tresa Garter, MD. Schedule an appointment as soon as possible for a visit in 1 week(s).   Specialty:  Internal Medicine Contact information: [place] [address] [phone]          Allergies  Allergen Reactions  . Ciprofloxacin Rash    Consultations:  Neurology/Stroke team   Procedures/Studies: Dg Chest 2 View  Result Date: 09/01/2016 CLINICAL DATA:  Chest pain EXAM: CHEST  2 VIEW COMPARISON:  06/15/2015 FINDINGS: Post sternotomy changes. No acute  consolidation or effusion. Cardiomediastinal silhouette within normal limits. No pneumothorax. Degenerative changes of the spine. IMPRESSION: No active cardiopulmonary disease. Electronically Signed   By: Donavan Foil M.D.   On: 09/01/2016 15:51   Mr Brain Wo Contrast  Result Date: 09/02/2016 CLINICAL DATA:  Left-sided facial droop and left-sided weakness. Speech difficulty. EXAM: MRI HEAD WITHOUT CONTRAST MRA HEAD WITHOUT CONTRAST TECHNIQUE: Multiplanar, multiecho pulse sequences of the brain and surrounding structures were obtained without intravenous contrast. Angiographic images of the head were obtained using MRA technique without contrast. COMPARISON:  Head CT 09/02/2016 Brain MRI 01/01/2014 FINDINGS: MRI HEAD FINDINGS Brain: The midline structures are normal. No focal diffusion restriction to indicate acute infarct. No intraparenchymal hemorrhage. There is multifocal hyperintense T2-weighted signal within the periventricular white matter, most often seen in the setting of chronic microvascular ischemia. No mass lesion. No chronic microhemorrhage or cerebral amyloid  angiopathy. No hydrocephalus, age advanced atrophy or lobar predominant volume loss. No dural abnormality or extra-axial collection. Skull and upper cervical spine: The visualized skull base, calvarium, upper cervical spine and extracranial soft tissues are normal. Sinuses/Orbits: No fluid levels or advanced mucosal thickening. No mastoid effusion. Normal orbits. MRA HEAD FINDINGS Intracranial internal carotid arteries: Normal. Anterior cerebral arteries: Normal. Middle cerebral arteries: Normal. Posterior communicating arteries: Absent bilaterally. Posterior cerebral arteries: Normal. Basilar artery: Normal. Vertebral arteries: Codominant. Normal. Superior cerebellar arteries: Normal. Anterior inferior cerebellar arteries: Normal. Posterior inferior cerebellar arteries: Normal. IMPRESSION: 1. No acute intracranial abnormality. 2. Findings of  mild chronic microvascular ischemia. 3. Normal intracranial MRA. Electronically Signed   By: Ulyses Jarred M.D.   On: 09/02/2016 05:12   Mr Jodene Nam Head/brain JO Cm  Result Date: 09/02/2016 CLINICAL DATA:  Left-sided facial droop and left-sided weakness. Speech difficulty. EXAM: MRI HEAD WITHOUT CONTRAST MRA HEAD WITHOUT CONTRAST TECHNIQUE: Multiplanar, multiecho pulse sequences of the brain and surrounding structures were obtained without intravenous contrast. Angiographic images of the head were obtained using MRA technique without contrast. COMPARISON:  Head CT 09/02/2016 Brain MRI 01/01/2014 FINDINGS: MRI HEAD FINDINGS Brain: The midline structures are normal. No focal diffusion restriction to indicate acute infarct. No intraparenchymal hemorrhage. There is multifocal hyperintense T2-weighted signal within the periventricular white matter, most often seen in the setting of chronic microvascular ischemia. No mass lesion. No chronic microhemorrhage or cerebral amyloid angiopathy. No hydrocephalus, age advanced atrophy or lobar predominant volume loss. No dural abnormality or extra-axial collection. Skull and upper cervical spine: The visualized skull base, calvarium, upper cervical spine and extracranial soft tissues are normal. Sinuses/Orbits: No fluid levels or advanced mucosal thickening. No mastoid effusion. Normal orbits. MRA HEAD FINDINGS Intracranial internal carotid arteries: Normal. Anterior cerebral arteries: Normal. Middle cerebral arteries: Normal. Posterior communicating arteries: Absent bilaterally. Posterior cerebral arteries: Normal. Basilar artery: Normal. Vertebral arteries: Codominant. Normal. Superior cerebellar arteries: Normal. Anterior inferior cerebellar arteries: Normal. Posterior inferior cerebellar arteries: Normal. IMPRESSION: 1. No acute intracranial abnormality. 2. Findings of mild chronic microvascular ischemia. 3. Normal intracranial MRA. Electronically Signed   By: Ulyses Jarred  M.D.   On: 09/02/2016 05:12   Ct Angio Chest/abd/pel For Dissection W And/or Wo Contrast  Result Date: 09/02/2016 CLINICAL DATA:  Left-sided weakness.  Central chest pain. EXAM: CT ANGIOGRAPHY CHEST, ABDOMEN AND PELVIS TECHNIQUE: Multidetector CT imaging through the chest, abdomen and pelvis was performed using the standard protocol during bolus administration of intravenous contrast. Multiplanar reconstructed images and MIPs were obtained and reviewed to evaluate the vascular anatomy. CONTRAST:  100 cc Isovue 370 IV COMPARISON:  Chest radiograph yesterday. FINDINGS: CTA CHEST FINDINGS Cardiovascular: Normal caliber thoracic aorta without dissection, hematoma, aneurysm or acute aortic syndrome. Common origin of the innominate and left common carotid artery, normal variant. No filling defects in the central pulmonary arteries to suggest pulmonary embolus. Heart is upper normal in size. No pericardial effusion. Patient is post CABG. Mediastinum/Nodes: No mediastinal or hilar adenopathy. No axillary adenopathy. Tiny hiatal hernia. Trachea and mainstem bronchi are patent. Lungs/Pleura: No consolidation or pulmonary edema. Small subpleural blebs in the left lower lobe. Faint clustered nodular opacities in the right upper lobe. No pulmonary mass. No pleural effusion. Musculoskeletal: Degenerative change in the thoracic spine. No acute abnormality. Post median sternotomy. Review of the MIP images confirms the above findings. CTA ABDOMEN AND PELVIS FINDINGS VASCULAR Aorta: Normal caliber aorta without aneurysm, dissection, vasculitis or significant stenosis. Minimal atherosclerosis distally. Celiac: Patent without evidence of aneurysm,  dissection, vasculitis or significant stenosis. SMA: Patent without evidence of aneurysm, dissection, vasculitis or significant stenosis. Renals: Both renal arteries are patent without evidence of aneurysm, dissection, vasculitis, fibromuscular dysplasia or significant stenosis. Instance  note of 2 right renal arteries. IMA: Patent without evidence of aneurysm, dissection, vasculitis or significant stenosis. Inflow: Patent without evidence of aneurysm, dissection, vasculitis or significant stenosis. Veins: No obvious venous abnormality within the limitations of this arterial phase study. Review of the MIP images confirms the above findings. NON-VASCULAR Hepatobiliary: No evidence of focal lesion allowing for arterial phase imaging. Clips in the gallbladder fossa postcholecystectomy. No biliary dilatation. Pancreas: No ductal dilatation or inflammation. Spleen: Normal arterial phase imaging.  Normal in size. Adrenals/Urinary Tract: Right adrenal adenoma measures fat density and is unchanged from 08/09/2014 CT. Minimal nodularity of the left adrenal gland without dominant lesion. No hydronephrosis or perinephric edema. Urinary bladder is physiologically distended. Stomach/Bowel: Ingested material within the stomach. Questionable gastric fundal and body wall thickening. No evidence of bowel obstruction, inflammation or wall thickening. Normal appendix. Moderate stool burden. Colonic diverticulosis from the transverse through the sigmoid, no acute diverticulitis. Lymphatic: Prominent right inguinal lymph nodes are unchanged from 08/09/2014 abdominal CT and likely reactive. Reproductive: Uterus and bilateral adnexa are unremarkable. Other: No ascites or free air. Laxity of the anterior abdominal wall musculature. Musculoskeletal: Degenerative disc disease and facet arthropathy in the lumbar spine. No acute or suspicious osseous abnormalities. Review of the MIP images confirms the above findings. IMPRESSION: 1. No aortic dissection or acute aortic syndrome. Abdominal aortic atherosclerosis. 2. Faint clustered nodularity in the right upper lung, likely bronchiolitis, infectious or inflammatory. 3. Incidental findings in the abdomen include right adrenal adenoma and colonic diverticulosis without acute  inflammation. Electronically Signed   By: Jeb Levering M.D.   On: 09/02/2016 02:24   Ct Head Code Stroke W/o Cm  Result Date: 09/02/2016 CLINICAL DATA:  Code stroke.  Left-sided facial droop EXAM: CT HEAD WITHOUT CONTRAST TECHNIQUE: Contiguous axial images were obtained from the base of the skull through the vertex without intravenous contrast. COMPARISON:  Brain MRI 01/01/2014 FINDINGS: Brain: No mass lesion, intraparenchymal hemorrhage or extra-axial collection. No evidence of acute cortical infarct. Brain parenchyma and CSF-containing spaces are normal for age. Partially empty sella, unchanged. Vascular: No hyperdense vessel or unexpected calcification. Skull: Normal visualized skull base, calvarium and extracranial soft tissues. Sinuses/Orbits: No sinus fluid levels or advanced mucosal thickening. No mastoid effusion. Normal orbits. ASPECTS [facility] Stroke Program Early CT Score) - Ganglionic level infarction (caudate, lentiform nuclei, internal capsule, insula, M1-M3 cortex): 7 - Supraganglionic infarction (M4-M6 cortex): 3 Total score (0-10 with 10 being normal): 10 IMPRESSION: 1. No acute abnormality. 2. ASPECTS is 10. These results were called by telephone at the time of interpretation on 09/02/2016 at 12:44 am to Dr. Wallie Char, who verbally acknowledged these results. Electronically Signed   By: Ulyses Jarred M.D.   On: 09/02/2016 00:45    Echocardiogram (09/03/2016)  Study Conclusions  - Left ventricle: The cavity size was normal. Wall thickness was   normal. Systolic function was normal. The estimated ejection   fraction was in the range of 55% to 60%. Wall motion was normal;   there were no regional wall motion abnormalities. Doppler   parameters are consistent with abnormal left ventricular   relaxation (grade 1 diastolic dysfunction). - Pulmonary arteries: Systolic pressure was moderately increased.   PA peak pressure: 50 mm Hg (S).  Impressions:  - Normal LV systolic  function; mild  diastolic dysfunction; mild TR;   moderately elevated pulmonary pressure.   Subjective: Patient reports no chest pain or dyspnea. She has intermittent left arm pain present for the past few months. No pain today  Discharge Exam: Vitals:   09/03/16 0647 09/03/16 0946  BP: (!) 121/57 (!) 112/49  Pulse: 71 76  Resp: 16 16  Temp: 97.7 F (36.5 C) 97.8 F (36.6 C)   Vitals:   09/02/16 2205 09/03/16 0027 09/03/16 0647 09/03/16 0946  BP: 129/60 (!) 111/50 (!) 121/57 (!) 112/49  Pulse: 97 67 71 76  Resp:  '16 16 16  '$ Temp: 97.8 F (36.6 C) 97.9 F (36.6 C) 97.7 F (36.5 C) 97.8 F (36.6 C)  TempSrc: Oral Oral Oral Oral  SpO2:  100% 98% 98%  Weight:      Height:        General: Pt is alert, awake, not in acute distress Cardiovascular: RRR, S1/S2 +, no rubs, no gallops Respiratory: CTA bilaterally, no wheezing, no rhonchi Abdominal: Soft, NT, ND, bowel sounds + Extremities: no edema, no cyanosis    The results of significant diagnostics from this hospitalization (including imaging, microbiology, ancillary and laboratory) are listed below for reference.     Microbiology: No results found for this or any previous visit (from the past 240 hour(s)).   Labs: BNP (last 3 results) No results for input(s): BNP in the last 8760 hours. Basic Metabolic Panel:  Recent Labs Lab 09/01/16 1454 09/02/16 0038 09/02/16 0043  NA 132* 134* 137  K 5.1 5.0 4.9  CL 100* 103 103  CO2 24 25  --   GLUCOSE 330* 332* 321*  BUN 21* 22* 24*  CREATININE 1.09* 1.39* 1.40*  CALCIUM 9.4 9.2  --    Liver Function Tests:  Recent Labs Lab 09/02/16 0038  AST 18  ALT 14  ALKPHOS 89  BILITOT 0.4  PROT 6.6  ALBUMIN 3.2*   No results for input(s): LIPASE, AMYLASE in the last 168 hours. No results for input(s): AMMONIA in the last 168 hours. CBC:  Recent Labs Lab 09/01/16 1454 09/02/16 0038 09/02/16 0043  WBC 8.4 9.2  --   NEUTROABS  --  5.2  --   HGB 10.2* 10.2*  11.6*  HCT 32.6* 32.9* 34.0*  MCV 77.4* 78.3  --   PLT 339 321  --    Cardiac Enzymes:  Recent Labs Lab 09/02/16 0038 09/02/16 0533 09/02/16 1027 09/02/16 1439  TROPONINI <0.03 <0.03 <0.03 <0.03   BNP: Invalid input(s): POCBNP CBG:  Recent Labs Lab 09/02/16 0625 09/02/16 1135 09/02/16 1609 09/02/16 2209 09/03/16 0708  GLUCAP 206* 167* 183* 343* 310*   D-Dimer No results for input(s): DDIMER in the last 72 hours. Hgb A1c  Recent Labs  09/02/16 0533  HGBA1C 13.1*   Lipid Profile  Recent Labs  09/02/16 0533  CHOL 137  HDL 61  LDLCALC 63  TRIG 67  CHOLHDL 2.2   Thyroid function studies No results for input(s): TSH, T4TOTAL, T3FREE, THYROIDAB in the last 72 hours.  Invalid input(s): FREET3 Anemia work up No results for input(s): VITAMINB12, FOLATE, FERRITIN, TIBC, IRON, RETICCTPCT in the last 72 hours. Urinalysis    Component Value Date/Time   COLORURINE YELLOW 02/27/2015 1236   APPEARANCEUR Clear 07/14/2016 1028   LABSPEC 1.017 02/27/2015 1236   PHURINE 5.0 02/27/2015 1236   GLUCOSEU 2+ (A) 07/14/2016 1028   HGBUR NEGATIVE 02/27/2015 1236   BILIRUBINUR Negative 07/14/2016 [place] 02/27/2015 1236  PROTEINUR 1+ (A) 07/14/2016 1028   PROTEINUR TRACE (A) 02/27/2015 1236   UROBILINOGEN 0.2 08/07/2014 1925   NITRITE Negative 07/14/2016 1028   NITRITE NEGATIVE 02/27/2015 1236   LEUKOCYTESUR Negative 07/14/2016 1028   Sepsis Labs Invalid input(s): PROCALCITONIN,  WBC,  LACTICIDVEN Microbiology No results found for this or any previous visit (from the past 240 hour(s)).   SIGNED:   Cordelia Poche, MD [facility] 09/03/2016, 12:37 PM Pager [phone]  If 7PM-7AM, please contact night-coverage [email or web address] Password [email or web address]

## 2016-09-03 NOTE — Care Management Note (Signed)
Case Management Note  Patient Details  Name: Kathlyne Loud MRN: [id number] Date of Birth: 12/14/1955  Subjective/Objective:     Pt admitted for TIA. Has history:  TIA vs Conversion disorder back in Aug 2015, CAD s/p CABG,Uncontrolled DM, HTN, HLD. She is from home, she is for possible dc today, per RN, she will need shower chair, NCM went to room to speak with patient , informed her daughter who was in the room, Joy that patient's insurance will not cover shower chair she will need to pay for it and she can get it cheaper at [employer].  Joy states she will get her a shower chair from [employer].  She has not other needs.                Action/Plan:   Expected Discharge Date:                  Expected Discharge Plan:  Home/Self Care  In-House Referral:     Discharge planning Services  CM Consult  Post Acute Care Choice:    Choice offered to:     DME Arranged:    DME Agency:     HH Arranged:    HH Agency:     Status of Service:  Completed, signed off  If discussed at [employer] of Stay Meetings, dates discussed:    Additional Comments:  Zenon Mayo, RN 09/03/2016, 11:36 AM

## 2016-09-03 NOTE — Discharge Instructions (Signed)
Deborah Coffey,  You were in the hospital for concern of a TIA. The neurologist feels your symptoms may be more related to stress. Either way, please continue to take your aspirin and cholesterol medication. Additionally, your heart ultrasound was significant for mild heart failure and increased pressure in the artery leading to your lungs. This is something the cardiologist will address when you see them on June 6th. As discussed, please adjust your diet and quit smoking to give yourself the best chance of doing well with these diagnoses. It was a pleasure taking care of you.

## 2016-09-03 NOTE — Progress Notes (Signed)
Inpatient Diabetes Program Recommendations  AACE/ADA: New Consensus Statement on Inpatient Glycemic Control (2015)  Target Ranges:  Prepandial:   less than 140 mg/dL      Peak postprandial:   less than 180 mg/dL (1-2 hours)      Critically ill patients:  140 - 180 mg/dL   Results for TAYONNA, BACHA (MRN [id number]) as of 09/03/2016 10:31  Ref. Range 09/02/2016 06:25 09/02/2016 11:35 09/02/2016 16:09 09/02/2016 22:09 09/03/2016 07:08  Glucose-Capillary Latest Ref Range: 65 - 99 mg/dL 206 (H) 167 (H) 183 (H) 343 (H) 310 (H)   Review of Glycemic Control  Diabetes history: DM 2 Outpatient Diabetes medications: Toujeo 35 units, Metformin 1000 mg BID Current orders for Inpatient glycemic control: Lantus 35 units, Novolog Moderate tid  Inpatient Diabetes Program Recommendations:    Glucose in the 300's, Lantus ordered, however, administration time is for tonight. Please adjust Lantus dosing times to daily for patient to get this am.   Thanks,  Tama Headings RN, MSN, [place] [facility] Coordinator Team Pager [phone] (8a-5p)

## 2016-09-03 NOTE — Progress Notes (Signed)
  Echocardiogram 2D Echocardiogram has been performed.  Johny Chess 09/03/2016, 11:45 AM

## 2016-09-17 ENCOUNTER — Encounter (HOSPITAL_COMMUNITY): Payer: Self-pay | Admitting: Emergency Medicine

## 2016-09-17 ENCOUNTER — Ambulatory Visit (HOSPITAL_COMMUNITY)
Admission: EM | Admit: 2016-09-17 | Discharge: 2016-09-17 | Disposition: A | Discharge status: Home / Self Care | Payer: Medicare HMO

## 2016-09-17 ENCOUNTER — Emergency Department (HOSPITAL_COMMUNITY): Payer: Medicare HMO

## 2016-09-17 ENCOUNTER — Emergency Department (HOSPITAL_COMMUNITY)
Admission: EM | Admit: 2016-09-17 | Discharge: 2016-09-17 | Disposition: A | Discharge status: Home / Self Care | Payer: Medicare HMO | Attending: Emergency Medicine | Admitting: Emergency Medicine

## 2016-09-17 DIAGNOSIS — R079 Chest pain, unspecified: Secondary | ICD-10-CM | POA: Diagnosis not present

## 2016-09-17 DIAGNOSIS — Z7982 Long term (current) use of aspirin: Secondary | ICD-10-CM | POA: Insufficient documentation

## 2016-09-17 DIAGNOSIS — G514 Facial myokymia: Secondary | ICD-10-CM | POA: Diagnosis not present

## 2016-09-17 DIAGNOSIS — R0789 Other chest pain: Secondary | ICD-10-CM | POA: Diagnosis not present

## 2016-09-17 DIAGNOSIS — I5032 Chronic diastolic (congestive) heart failure: Secondary | ICD-10-CM | POA: Diagnosis not present

## 2016-09-17 DIAGNOSIS — E1165 Type 2 diabetes mellitus with hyperglycemia: Secondary | ICD-10-CM | POA: Insufficient documentation

## 2016-09-17 DIAGNOSIS — F17210 Nicotine dependence, cigarettes, uncomplicated: Secondary | ICD-10-CM | POA: Diagnosis not present

## 2016-09-17 DIAGNOSIS — Z79899 Other long term (current) drug therapy: Secondary | ICD-10-CM | POA: Insufficient documentation

## 2016-09-17 DIAGNOSIS — I110 Hypertensive heart disease with heart failure: Secondary | ICD-10-CM | POA: Insufficient documentation

## 2016-09-17 DIAGNOSIS — Z794 Long term (current) use of insulin: Secondary | ICD-10-CM | POA: Diagnosis not present

## 2016-09-17 DIAGNOSIS — I2510 Atherosclerotic heart disease of native coronary artery without angina pectoris: Secondary | ICD-10-CM | POA: Diagnosis not present

## 2016-09-17 DIAGNOSIS — E1141 Type 2 diabetes mellitus with diabetic mononeuropathy: Secondary | ICD-10-CM | POA: Insufficient documentation

## 2016-09-17 DIAGNOSIS — R600 Localized edema: Secondary | ICD-10-CM | POA: Insufficient documentation

## 2016-09-17 DIAGNOSIS — R220 Localized swelling, mass and lump, head: Secondary | ICD-10-CM | POA: Diagnosis not present

## 2016-09-17 DIAGNOSIS — Z951 Presence of aortocoronary bypass graft: Secondary | ICD-10-CM | POA: Insufficient documentation

## 2016-09-17 LAB — BASIC METABOLIC PANEL
Anion gap: 8 (ref 5–15)
BUN: 16 mg/dL (ref 6–20)
CALCIUM: 9.1 mg/dL (ref 8.9–10.3)
CO2: 24 mmol/L (ref 22–32)
CREATININE: 1.07 mg/dL — AB (ref 0.44–1.00)
Chloride: 106 mmol/L (ref 101–111)
GFR calc Af Amer: >60 mL/min (ref >60)
GFR calc non Af Amer: 56 mL/min — ABNORMAL LOW (ref >60)
GLUCOSE: 264 mg/dL — AB (ref 65–99)
Potassium: 4.3 mmol/L (ref 3.5–5.1)
Sodium: 138 mmol/L (ref 135–145)

## 2016-09-17 LAB — CBC
HCT: 31.1 % — ABNORMAL LOW (ref 36.0–46.0)
Hemoglobin: 9.6 g/dL — ABNORMAL LOW (ref 12.0–15.0)
MCH: 24.2 pg — AB (ref 26.0–34.0)
MCHC: 30.9 g/dL (ref 30.0–36.0)
MCV: 78.5 fL (ref 78.0–100.0)
Platelets: 320 10*3/uL (ref 150–400)
RBC: 3.96 MIL/uL (ref 3.87–5.11)
RDW: 17.1 % — AB (ref 11.5–15.5)
WBC: 8.2 10*3/uL (ref 4.0–10.5)

## 2016-09-17 LAB — BRAIN NATRIURETIC PEPTIDE: B Natriuretic Peptide: 118.5 pg/mL — ABNORMAL HIGH (ref 0.0–100.0)

## 2016-09-17 LAB — I-STAT TROPONIN, ED
TROPONIN I, POC: 0 ng/mL (ref 0.00–0.08)
Troponin i, poc: 0 ng/mL (ref 0.00–0.08)

## 2016-09-17 MED ORDER — ASPIRIN 325 MG PO TABS
325.0000 mg | ORAL_TABLET | Freq: Every day | ORAL | Status: DC
  Administered 2016-09-17: 325 mg via ORAL
  Filled 2016-09-17: qty 1

## 2016-09-17 NOTE — ED Provider Notes (Signed)
[facility] DEPT Provider Note   CSN: [id number] Arrival date & time: 09/17/16  1438     History   Chief Complaint Chief Complaint  Patient presents with  . Chest Pain  . Leg Swelling    HPI Deborah Coffey is a 61 y.o. female.   Chest Pain   This is a recurrent problem. The current episode started yesterday. The problem occurs constantly. The problem has been resolved. Associated with: Patient was at rest yesterday, mild left chest squeezing. No longer with chest pain. Pain location: Left chest. The pain is at a severity of 0/10. The patient is experiencing no pain. Quality: Squeezing. The pain does not radiate. Exacerbated by: Nothing improves or worsens. Associated symptoms include cough ( Chronic, nonproductive) and lower extremity edema ( Mild swelling to bilateral feet.). Pertinent negatives include no abdominal pain, no back pain, no dizziness, no exertional chest pressure, no fever, no hemoptysis, no leg pain, no nausea, no numbness, no orthopnea, no palpitations, no PND, no shortness of breath, no sputum production, no syncope, no vomiting and no weakness. Associated symptoms comments: Patient also endorses left mouth twitching, occurring at least once a day. Associated with confusion. No generalized shaking. No loss of consciousness. Patient's daughter states that when her twitching resolved her confusion improves. Last episode earlier today.. She has tried nothing for the symptoms. The treatment provided no relief.  Pertinent negatives for past medical history include no seizures.    Past Medical History:  Diagnosis Date  . Allergy   . Anemia   . CAD in native artery 02/2004   Mid LAD lesoin just after Large D1 --> CABG x 1 LIMA-LAD  . CAP (community acquired pneumonia) 08/07/2014  . Chronic bronchitis ([place])    "I think I get it q yr" (08/07/2014)  . Depression   . High cholesterol   . History of blood transfusion 2005   "related to OHS"   . Hypertension   . S/P  CABG x 1 02/2004   LIMA-LAD; patent by cath in 2010 (LAD lesion actually improved. competitive flow  . Type II diabetes mellitus [facility])     Patient Active Problem List   Diagnosis Date Noted  . Facial numbness 09/03/2016  . Chronic diastolic CHF (congestive heart failure) ([place]) 09/03/2016  . Anxiety   . Eczema 01/29/2016  . History of adenomatous polyp of colon 10/01/2015  . Floaters in visual field 09/04/2015  . SOB (shortness of breath) 06/26/2015  . Screening for colon cancer 06/26/2015  . Type 2 diabetes mellitus with complication, with long-term current use of insulin ([place]) 02/27/2015  . Diabetic mononeuropathy associated with type 2 diabetes mellitus ([place]) 02/27/2015  . LLQ abdominal pain   . CAP (community acquired pneumonia) 08/07/2014  . AKI (acute kidney injury) ([place]) 08/07/2014  . Type 2 diabetes mellitus with hyperglycemia ([place]) 07/08/2014  . Preventative health care 07/08/2014  . Depression 07/08/2014  . Acute upper respiratory infection 07/08/2014  . Smoking addiction 07/08/2014  . Essential hypertension 01/10/2014  . Depression (emotion) 01/10/2014  . H/O TIA (transient ischemic attack) and stroke 01/10/2014  . Visual changes 01/02/2014  . Facial weakness 01/02/2014  . Conversion disorder 01/01/2014  . Tobacco use 06/20/2013  . DM (diabetes mellitus) type 2, uncontrolled, with ketoacidosis 04/03/2013  . Atypical chest pain 03/22/2013  . HTN (hypertension) 03/22/2013  . Essential hypertension, benign 02/14/2013  . Accelerated hypertension 09/19/2012  . Uncontrolled diabetes mellitus ([place]) 09/19/2012  . Dyslipidemia 09/19/2012  . CAD (coronary artery disease) -  CABG x 1 (LIMA-LAD) 02/21/2004    Past Surgical History:  Procedure Laterality Date  . CARDIAC CATHETERIZATION  2010   Previous LAD 95% lesion - now ~30-40%; patent LIMA with competitive flow  . CESAREAN SECTION  1977; 1989  . CHOLECYSTECTOMY  ~ 2012  . CORONARY ARTERY BYPASS GRAFT  02/2004   "CABG  X1" (03/22/2013); LIMA-LAD  . TUBAL LIGATION  1989    OB History    No data available       Home Medications    Prior to Admission medications   Medication Sig Start Date End Date Taking? Authorizing Provider  acetaminophen (TYLENOL) 500 MG tablet Take 1,000 mg by mouth every 6 (six) hours as needed for headache (pain).    Yes [provider]  aspirin EC 81 MG tablet Take 81 mg by mouth daily.   Yes [provider]  atorvastatin (LIPITOR) 10 MG tablet Take 1 tablet (10 mg total) by mouth daily. 07/15/16  Yes Tresa Garter, MD  gabapentin (NEURONTIN) 300 MG capsule Take 1 capsule (300 mg total) by mouth 3 (three) times daily. 07/07/16  Yes Tresa Garter, MD  hydrochlorothiazide (MICROZIDE) 12.5 MG capsule Take 1 capsule (12.5 mg total) by mouth daily. 07/15/16  Yes Tresa Garter, MD  Insulin Glargine (TOUJEO SOLOSTAR) 300 UNIT/ML SOPN Inject 35 Units into the skin daily with breakfast. Patient taking differently: Inject 37 Units into the skin daily with breakfast.  07/14/16  Yes Jegede, Olugbemiga E, MD  lisinopril (PRINIVIL,ZESTRIL) 10 MG tablet Take 1 tablet (10 mg total) by mouth daily. 07/07/16  Yes Tresa Garter, MD  metFORMIN (GLUCOPHAGE-XR) 500 MG 24 hr tablet TAKE 2 TABLETS BY MOUTH 2 TIMES DAILY WITH A MEAL Patient taking differently: Take 1,000 mg by mouth 2 (two) times daily with a meal.  07/15/16  Yes Jegede, Olugbemiga E, MD  metoprolol (LOPRESSOR) 50 MG tablet Take 1 tablet (50 mg total) by mouth 2 (two) times daily. 07/15/16  Yes Tresa Garter, MD  Multiple Vitamin (MULTIVITAMIN WITH MINERALS) TABS tablet Take 1 tablet by mouth daily.   Yes [provider]  nitroGLYCERIN (NITROSTAT) 0.4 MG SL tablet Place 1 tablet (0.4 mg total) under the tongue every 5 (five) minutes as needed for chest pain. 06/26/15  Yes Tresa Garter, MD  pantoprazole (PROTONIX) 40 MG tablet Take 1 tablet (40 mg total) by mouth daily. Patient  taking differently: Take 40 mg by mouth at bedtime.  07/07/16  Yes Tresa Garter, MD  ACCU-CHEK FASTCLIX LANCETS MISC Use as directed to check blood glucose 3 times daily. E11.9 07/07/16   Tresa Garter, MD  Alcohol Swabs (B-D SINGLE USE SWABS REGULAR) PADS Use as directed 07/07/16   Tresa Garter, MD  Blood Glucose Monitoring Suppl (ACCU-CHEK NANO SMARTVIEW) w/Device KIT Use as directed to check blood glucose 3 times daily. E11.9 07/07/16   Tresa Garter, MD  glucose blood (ACCU-CHEK SMARTVIEW) test strip Use as instructed to test blood glucose 3 times daily. E11.9 07/15/16   Tresa Garter, MD    Family History Family History  Problem Relation Age of Onset  . Heart disease Mother   . Diabetes Mother   . Cancer Mother        cervical  . Diabetes Father   . Lupus Sister   . Heart failure Sister   . Hepatitis C Sister   . Heart failure Brother   . Diabetes Brother   . Hypertension Daughter   .  Colon cancer Neg Hx     Social History Social History  Substance Use Topics  . Smoking status: Current Some Day Smoker    Packs/day: 0.50    Years: 42.00    Types: Cigarettes  . Smokeless tobacco: Never Used  . Alcohol use 0.6 - 1.2 oz/week    1 - 2 Glasses of wine per week     Comment: 08/07/2014 "glass of wine maybe 2 times/yr"     Allergies   Ciprofloxacin   Review of Systems Review of Systems  Constitutional: Negative for appetite change and fever.  HENT: Negative for congestion.   Respiratory: Positive for cough ( Chronic, nonproductive) and chest tightness. Negative for hemoptysis, sputum production and shortness of breath.   Cardiovascular: Positive for chest pain and leg swelling. Negative for palpitations, orthopnea, syncope and PND.  Gastrointestinal: Negative for abdominal pain, blood in stool, diarrhea, nausea and vomiting.  Genitourinary: Negative for dysuria, flank pain and hematuria.  Musculoskeletal: Negative for back pain and gait  problem.  Skin: Negative for rash.  Neurological: Positive for facial asymmetry. Negative for dizziness, seizures, speech difficulty, weakness, light-headedness and numbness.  Psychiatric/Behavioral: Negative for behavioral problems.     Physical Exam Updated Vital Signs BP (!) 150/75 (BP Location: Right Arm)   Pulse 64   Temp 98.3 F (36.8 C) (Oral)   Resp 16   Ht _0  (1.676 m)   Wt 108.4 kg (239 lb)   SpO2 97%   BMI 38.58 kg/m   Physical Exam  Constitutional: She is oriented to person, place, and time. She appears well-developed and well-nourished.  HENT:  Head: Atraumatic.  Mouth/Throat: Oropharynx is clear and moist.  Eyes: Conjunctivae and EOM are normal.  Neck: Normal range of motion. Neck supple. No JVD present.  Cardiovascular: Normal rate, regular rhythm, normal heart sounds and intact distal pulses.   No murmur heard. Pulmonary/Chest: Effort normal and breath sounds normal. No respiratory distress. She has no wheezes.  Mild crackles to bilateral lower lung fields. Speaking in full sentences, no respiratory distress or increased work of breathing.  Abdominal: She exhibits no distension. There is no tenderness. There is no guarding.  Musculoskeletal: Normal range of motion. She exhibits edema ( +1 pitting edema to bilateral feet.).  No unilateral leg swelling, no tenderness to palpation  Neurological: She is alert and oriented to person, place, and time. She displays normal reflexes. No cranial nerve deficit. She exhibits normal muscle tone. Coordination normal.  Skin: Skin is warm. No pallor.  Psychiatric: She has a normal mood and affect.     ED Treatments / Results  Labs (all labs ordered are listed, but only abnormal results are displayed) Labs Reviewed  BASIC METABOLIC PANEL - Abnormal; Notable for the following:       Result Value   Glucose, Bld 264 (*)    Creatinine, Ser 1.07 (*)    GFR calc non Af Amer 56 (*)    All other components within normal  limits  CBC - Abnormal; Notable for the following:    Hemoglobin 9.6 (*)    HCT 31.1 (*)    MCH 24.2 (*)    RDW 17.1 (*)    All other components within normal limits  BRAIN NATRIURETIC PEPTIDE - Abnormal; Notable for the following:    B Natriuretic Peptide 118.5 (*)    All other components within normal limits  I-STAT TROPOININ, ED  I-STAT TROPOININ, ED    EKG  EKG Interpretation  Date/Time:  Friday September 17 2016 14:48:31 EDT Ventricular Rate:  73 PR Interval:  170 QRS Duration: 74 QT Interval:  398 QTC Calculation: 438 R Axis:   59 Text Interpretation:  Normal sinus rhythm T wave abnormality No significant change since last tracing Abnormal ekg Confirmed by Carmin Muskrat [id number]) on 09/17/2016 5:49:59 PM       Radiology Dg Chest 2 View  Result Date: 09/17/2016 CLINICAL DATA:  Persistent chest pain EXAM: CHEST  2 VIEW COMPARISON:  09/02/2016 FINDINGS: Cardiac shadow is within normal limits. Postsurgical changes are again seen. Lungs are well aerated bilaterally without focal infiltrate or sizable effusion. Previously seen nodularity in the right upper lobe is not well appreciated on this exam. IMPRESSION: No active cardiopulmonary disease. Electronically Signed   By: Inez Catalina M.D.   On: 09/17/2016 15:56    Procedures Procedures (including critical care time)  Medications Ordered in ED Medications  aspirin tablet 325 mg (325 mg Oral Given 09/17/16 1950)     Initial Impression / Assessment and Plan / ED Course  I have reviewed the triage vital signs and the nursing notes.  Pertinent labs & imaging results that were available during my care of the patient were reviewed by me and considered in my medical decision making (see chart for details).     Patient is a 61 year old female past history significant for CAD s/p CABG, mild diastolic heart failure, who presents to the emergency department with chest pain, lower extremity edema, intermittent left facial twitching. On  arrival. Distress, not ill appearing. Afebrile, hemodynamically stable. Patient is currently chest pain-free, no dyspnea, no orthopnea.  Differential includes heart failure exacerbation, ACS, pneumonia. Left facial twitching associated with confusion, concern for partial seizures. No active seizures at this time, no prior history of epilepsy. Doubt intracranial hemorrhage or mass, recent MRI for similar left facial twitching was negative. No falls or head trauma, do not feel that repeat CT scan is necessary at this time.  EKG showed normal sinus rhythm, nonspecific T-wave abnormalities, no chamber enlargement, no signs of acute ischemia. When compared to prior EKG. Chest x-ray showed no acute findings, no signs of pulmonary edema. BNP 100s. Delta troponin undetectable. No significant electrolyte abnormalities.  Doubt ACS, although the patient is high risk given her history of coronary artery disease, no active chest pain at this time, atypical chest pain, delta troponin undetectable. Doubt CHF exacerbation, mild elevation of BNP, no orthopnea or pulmonary edema. No signs of pneumonia. Patient given aspirin.  Patient is stable for discharge home. Given strict return precautions to the emergency department. Patient told to follow up with primary care physician in the next 1-2 days for possible started on Lasix for lower extremity edema. Patient given follow-up information to see neurology as outpatient for possible partial seizures. Patient expressed understanding, no questions or concerns, discharged.  Final Clinical Impressions(s) / ED Diagnoses   Final diagnoses:  Atypical chest pain  Bilateral lower extremity edema  Facial twitching    New Prescriptions New Prescriptions   No medications on file     Nathaniel Man, MD 09/17/16 2345    Carmin Muskrat, MD 09/19/16 2317

## 2016-09-17 NOTE — ED Notes (Signed)
Lab contacted about BNP; to be added on

## 2016-09-17 NOTE — ED Notes (Signed)
She feels ok

## 2016-09-17 NOTE — ED Triage Notes (Signed)
Pt reports ongoing chest pain, left arm pain and bilateral leg swelling for the past few weeks, states she was admitted for the same a few weeks ago. Pt states she had a fluid pill but was told to stop taking it by someone. Pt a/ox4.

## 2016-09-17 NOTE — ED Triage Notes (Signed)
pt sent to ER for further eval of chset pain, left arm pain, SOB and edema per Jeanett Schlein NP

## 2016-09-17 NOTE — ED Notes (Signed)
The pt is c/o lt arm pain  For months  She has not seen a doctor for this.  No known injury   Alert oriented skin warm and d ry

## 2016-09-20 MED FILL — TOUJEO SOLOSTAR 300 UNITS/M: 300 | 30 days supply | Qty: 3 | Fill #7

## 2016-09-22 ENCOUNTER — Encounter: Payer: Self-pay | Admitting: Cardiology

## 2016-09-22 ENCOUNTER — Ambulatory Visit (INDEPENDENT_AMBULATORY_CARE_PROVIDER_SITE_OTHER): Payer: Medicare HMO | Admitting: Cardiology

## 2016-09-22 VITALS — BP 138/76 | HR 88 | Ht 66.0 in | Wt 244.0 lb

## 2016-09-22 DIAGNOSIS — E119 Type 2 diabetes mellitus without complications: Secondary | ICD-10-CM | POA: Diagnosis not present

## 2016-09-22 DIAGNOSIS — Z720 Tobacco use: Secondary | ICD-10-CM | POA: Diagnosis not present

## 2016-09-22 DIAGNOSIS — I2510 Atherosclerotic heart disease of native coronary artery without angina pectoris: Secondary | ICD-10-CM | POA: Diagnosis not present

## 2016-09-22 DIAGNOSIS — I10 Essential (primary) hypertension: Secondary | ICD-10-CM | POA: Diagnosis not present

## 2016-09-22 DIAGNOSIS — R0789 Other chest pain: Secondary | ICD-10-CM | POA: Diagnosis not present

## 2016-09-22 DIAGNOSIS — I2729 Other secondary pulmonary hypertension: Secondary | ICD-10-CM

## 2016-09-22 NOTE — Progress Notes (Signed)
Cardiology Office Note:    Date:  09/22/2016   ID:  Deborah Coffey, DOB 12-Feb-1956, MRN [id number]  PCP:  Tresa Garter, MD  Cardiologist:  Candee Furbish, MD   Referring MD: Tresa Garter, MD     History of Present Illness:    Deborah Coffey is a 61 y.o. female here for the evaluation of chest pain at the request of Dr. Cristie Hem.It has been greater than 3 years since prior office visit.  Was in the ER on 09/17/16 with mid left chest pain, squeezing, no radiation. Cough and lower extremity edema noted.   Was in hospital 2 weeks prior to that for 2 days. There was some concern about facial droop. She is seeing neurology soon.  An echocardiogram was performed that showed me pressures of 50. Grade 1 diastolic dysfunction. BNP was mildly elevated at 110. CT scan of chest showed no dissection, no PE.  She sometimes has symptoms of left arm discomfort especially when lifting heavy bags like grocery bags for instance or when raising her arm to brush her hair. She thinks it may be a tendon. She was concerned because she had left arm pain prior to her bypass surgery in 2005.  Troponins were all normal.  No lower extremity edema currently. Continues to smoke.   Past Medical History:  Diagnosis Date  . Allergy   . Anemia   . CAD in native artery 02/2004   Mid LAD lesoin just after Large D1 --> CABG x 1 LIMA-LAD  . CAP (community acquired pneumonia) 08/07/2014  . Chronic bronchitis ([place])    "I think I get it q yr" (08/07/2014)  . Depression   . High cholesterol   . History of blood transfusion 2005   "related to OHS"   . Hypertension   . 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 competitive flow  . Type II diabetes mellitus ([place])     Past Surgical History:  Procedure Laterality Date  . CARDIAC CATHETERIZATION  2010   Previous LAD 95% lesion - now ~30-40%; patent LIMA with competitive flow  . CESAREAN SECTION  1977;  1989  . CHOLECYSTECTOMY  ~ 2012  . CORONARY ARTERY BYPASS GRAFT  02/2004   "CABG X1" (03/22/2013); LIMA-LAD  . TUBAL LIGATION  1989    Current Medications: Current Meds  Medication Sig  . ACCU-CHEK FASTCLIX LANCETS MISC Use as directed to check blood glucose 3 times daily. E11.9  . acetaminophen (TYLENOL) 500 MG tablet Take 1,000 mg by mouth every 6 (six) hours as needed for headache (pain).   . Alcohol Swabs (B-D SINGLE USE SWABS REGULAR) PADS Use as directed  . aspirin EC 81 MG tablet Take 81 mg by mouth daily.  Marland Kitchen atorvastatin (LIPITOR) 10 MG tablet Take 1 tablet (10 mg total) by mouth daily.  . Blood Glucose Monitoring Suppl (ACCU-CHEK NANO SMARTVIEW) w/Device KIT Use as directed to check blood glucose 3 times daily. E11.9  . furosemide (LASIX) 20 MG tablet Take 20 mg by mouth daily.  Marland Kitchen gabapentin (NEURONTIN) 300 MG capsule Take 1 capsule (300 mg total) by mouth 3 (three) times daily.  Marland Kitchen glucose blood (ACCU-CHEK SMARTVIEW) test strip Use as instructed to test blood glucose 3 times daily. E11.9  . hydrochlorothiazide (MICROZIDE) 12.5 MG capsule Take 1 capsule (12.5 mg total) by mouth daily.  . Insulin Glargine (TOUJEO SOLOSTAR) 300 UNIT/ML SOPN Inject 35 Units into the skin daily with breakfast. (Patient  taking differently: Inject 37 Units into the skin daily with breakfast. )  . lisinopril (PRINIVIL,ZESTRIL) 10 MG tablet Take 1 tablet (10 mg total) by mouth daily.  . metFORMIN (GLUCOPHAGE-XR) 500 MG 24 hr tablet TAKE 2 TABLETS BY MOUTH 2 TIMES DAILY WITH A MEAL (Patient taking differently: Take 1,000 mg by mouth 2 (two) times daily with a meal. )  . metoprolol (LOPRESSOR) 50 MG tablet Take 1 tablet (50 mg total) by mouth 2 (two) times daily.  . Multiple Vitamin (MULTIVITAMIN WITH MINERALS) TABS tablet Take 1 tablet by mouth daily.  . nitroGLYCERIN (NITROSTAT) 0.4 MG SL tablet Place 1 tablet (0.4 mg total) under the tongue every 5 (five) minutes as needed for chest pain.  . pantoprazole  (PROTONIX) 40 MG tablet Take 1 tablet (40 mg total) by mouth daily. (Patient taking differently: Take 40 mg by mouth at bedtime. )  . Vitamin D, Ergocalciferol, (DRISDOL) 50000 units CAPS capsule Take 1 capsule by mouth once a week.     Allergies:   Ciprofloxacin   Social History   Social History  . Marital status: Divorced    Spouse name: N/A  . Number of children: N/A  . Years of education: N/A   Social History Main Topics  . Smoking status: Current Some Day Smoker    Packs/day: 0.50    Years: 42.00    Types: Cigarettes  . Smokeless tobacco: Never Used  . Alcohol use 0.6 - 1.2 oz/week    1 - 2 Glasses of wine per week     Comment: 08/07/2014 "glass of wine maybe 2 times/yr"  . Drug use: No     Comment: smoked pot years ago but not currently  . Sexual activity: Not Currently   Other Topics Concern  . None   Social History Narrative  . None     Family History: The patient's family history includes Cancer in her mother; Diabetes in her brother, father, and mother; Heart disease in her mother; Heart failure in her brother and sister; Hepatitis C in her sister; Hypertension in her daughter; Lupus in her sister. There is no history of Colon cancer. ROS:   Please see the history of present illness.   No orthopnea, no PND, no syncope, no bleeding  All other systems reviewed and are negative.  EKGs/Labs/Other Studies Reviewed:    The following studies were reviewed today:  ECHO 09/03/16 - Left ventricle: The cavity size was normal. Wall thickness was   normal. Systolic function was normal. The estimated ejection   fraction was in the range of 55% to 60%. Wall motion was normal;   there were no regional wall motion abnormalities. Doppler   parameters are consistent with abnormal left ventricular   relaxation (grade 1 diastolic dysfunction). - Pulmonary arteries: Systolic pressure was moderately increased.   PA peak pressure: 50 mm Hg (S).  Impressions:  - Normal LV  systolic function; mild diastolic dysfunction; mild TR;   moderately elevated pulmonary pressure.  Nuclear stress test in 03/23/13  - No ischemia.  EKG:  09/17/16-sinus rhythm 73 with T-wave inversion subtle in V1 and V2. Personally viewed, no specific changes from prior.  Recent Labs: 09/02/2016: ALT 14 09/17/2016: B Natriuretic Peptide 118.5; BUN 16; Creatinine, Ser 1.07; Hemoglobin 9.6; Platelets 320; Potassium 4.3; Sodium 138   Recent Lipid Panel    Component Value Date/Time   CHOL 137 09/02/2016 0533   CHOL 167 07/14/2016 1028   TRIG 67 09/02/2016 0533   HDL 61 09/02/2016  0533   HDL 71 07/14/2016 1028   CHOLHDL 2.2 09/02/2016 0533   VLDL 13 09/02/2016 0533   LDLCALC 63 09/02/2016 0533   LDLCALC 77 07/14/2016 1028    Physical Exam:    VS:  BP 138/76   Pulse 88   Ht 5' 6" (1.676 m)   Wt 244 lb (110.7 kg)   LMP  (LMP Unknown)   BMI 39.38 kg/m     Wt Readings from Last 3 Encounters:  09/22/16 244 lb (110.7 kg)  09/17/16 239 lb (108.4 kg)  09/02/16 229 lb 15 oz (104.3 kg)     GEN:  Well nourished, well developed in no acute distress HEENT: Normal NECK: No JVD; No carotid bruits LYMPHATICS: No lymphadenopathy CARDIAC: RRR, no murmurs, rubs, gallops RESPIRATORY:  Clear to auscultation without rales, wheezing or rhonchi  ABDOMEN: Soft, non-tender, non-distended, obese MUSCULOSKELETAL:  No edema; No deformity  SKIN: Warm and dry NEUROLOGIC:  Alert and oriented x 3 PSYCHIATRIC:  Normal affect   ASSESSMENT:    1. Atypical chest pain   2. Morbid obesity ([place])   3. Diabetes mellitus with coincident hypertension ([place])   4. Coronary artery disease involving native coronary artery of native heart without angina pectoris   5. Other secondary pulmonary hypertension ([place])   6. Tobacco use    PLAN:    In order of problems listed above:  Atypical chest pain/arm pain  - Possibly musculoskeletal given that she will have left arm discomfort that lingers for many hours  especially after lifting heavy object or brushing her hair she states.  - If symptoms worsen or become more worrisome, we will have low threshold for starting her with stress test.  - Prior stress test in 2014 was low risk, reassuring.  Coronary artery disease status post CABG 1 in 2005  - LIMA to LAD  - Echocardiogram shows normal wall motion. It is doubtful that she has had any problems with her LIMA graft.   Secondary pulmonary hypertension  - Smoking, weight discussed both.  Tobacco use  - Continue to encourage cessation.   Possible history of TIA, possible left facial droop  -She is seeing neurology  Diabetes with hypertension  - Medications reviewed. Per primary.  Grade 1 diastolic dysfunction  - This finding is to be expected with age as well as obesity.  - Continue with current medications.      Medication Adjustments/Labs and Tests Ordered: Current medicines are reviewed at length with the patient today.  Concerns regarding medicines are outlined above. Labs and tests ordered and medication changes are outlined in the patient instructions below:  Patient Instructions  Medication Instructions:  The current medical regimen is effective;  continue present plan and medications.  Follow-Up: Follow up in 6 months with Nell Range, PA.  You will receive a letter in the mail 2 months before you are due.  Please call us when you receive this letter to schedule your follow up appointment.  Follow up in 1 year with Dr. Marlou Porch.  You will receive a letter in the mail 2 months before you are due.  Please call us when you receive this letter to schedule your follow up appointment.  If you need a refill on your cardiac medications before your next appointment, please call your pharmacy.  Thank you for choosing [facility]!!        Signed, Candee Furbish, MD  09/22/2016 9:43 AM    [facility]

## 2016-09-22 NOTE — Patient Instructions (Signed)
Medication Instructions:  The current medical regimen is effective;  continue present plan and medications.  Follow-Up: Follow up in 6 months with Nell Range, PA.  You will receive a letter in the mail 2 months before you are due.  Please call us when you receive this letter to schedule your follow up appointment.  Follow up in 1 year with Dr. Marlou Porch.  You will receive a letter in the mail 2 months before you are due.  Please call us when you receive this letter to schedule your follow up appointment.  If you need a refill on your cardiac medications before your next appointment, please call your pharmacy.  Thank you for choosing [facility]!!

## 2016-09-24 ENCOUNTER — Ambulatory Visit: Payer: Medicare HMO | Admitting: Podiatry

## 2016-10-08 ENCOUNTER — Other Ambulatory Visit: Payer: Self-pay | Admitting: Internal Medicine

## 2016-10-08 DIAGNOSIS — Z1231 Encounter for screening mammogram for malignant neoplasm of breast: Secondary | ICD-10-CM

## 2016-10-18 MED FILL — TOUJEO SOLOSTAR 300 UNITS/M: 300 | 30 days supply | Qty: 3 | Fill #8

## 2016-10-19 ENCOUNTER — Ambulatory Visit: Payer: Self-pay | Admitting: Internal Medicine

## 2016-10-25 ENCOUNTER — Ambulatory Visit
Admission: RE | Admit: 2016-10-25 | Discharge: 2016-10-25 | Disposition: A | Discharge status: Home / Self Care | Payer: Medicare HMO | Source: Ambulatory Visit | Attending: Internal Medicine | Admitting: Internal Medicine

## 2016-10-25 DIAGNOSIS — Z1231 Encounter for screening mammogram for malignant neoplasm of breast: Secondary | ICD-10-CM

## 2016-10-26 ENCOUNTER — Telehealth: Payer: Self-pay | Admitting: Internal Medicine

## 2016-10-26 NOTE — Telephone Encounter (Signed)
-----   Message from Trecia Rogers, [place] sent at 10/26/2016  3:46 PM EDT ----- Please inform patient of mammogram screening being negative for malignancy. Recommended screening be completed in one year.

## 2016-10-26 NOTE — Telephone Encounter (Signed)
Patient verified DOB  CMA student advised patient that her mammogram results where negative.    CMA student advised patient to have another mammogram in one year Patient expressed her understanding and had no further questions at this time.

## 2016-10-29 DIAGNOSIS — R1314 Dysphagia, pharyngoesophageal phase: Secondary | ICD-10-CM | POA: Diagnosis not present

## 2016-10-29 DIAGNOSIS — D126 Benign neoplasm of colon, unspecified: Secondary | ICD-10-CM | POA: Diagnosis not present

## 2016-11-03 ENCOUNTER — Ambulatory Visit: Payer: Medicare HMO | Attending: Internal Medicine | Admitting: Internal Medicine

## 2016-11-03 ENCOUNTER — Encounter: Payer: Self-pay | Admitting: Internal Medicine

## 2016-11-03 VITALS — BP 134/66 | HR 74 | Temp 97.8°F | Resp 18 | Ht 65.0 in | Wt 242.0 lb

## 2016-11-03 DIAGNOSIS — J42 Unspecified chronic bronchitis: Secondary | ICD-10-CM | POA: Insufficient documentation

## 2016-11-03 DIAGNOSIS — Z79899 Other long term (current) drug therapy: Secondary | ICD-10-CM | POA: Diagnosis not present

## 2016-11-03 DIAGNOSIS — I10 Essential (primary) hypertension: Secondary | ICD-10-CM | POA: Diagnosis not present

## 2016-11-03 DIAGNOSIS — Z794 Long term (current) use of insulin: Secondary | ICD-10-CM

## 2016-11-03 DIAGNOSIS — E7800 Pure hypercholesterolemia, unspecified: Secondary | ICD-10-CM | POA: Diagnosis not present

## 2016-11-03 DIAGNOSIS — Z951 Presence of aortocoronary bypass graft: Secondary | ICD-10-CM | POA: Insufficient documentation

## 2016-11-03 DIAGNOSIS — Z7982 Long term (current) use of aspirin: Secondary | ICD-10-CM | POA: Insufficient documentation

## 2016-11-03 DIAGNOSIS — I2510 Atherosclerotic heart disease of native coronary artery without angina pectoris: Secondary | ICD-10-CM | POA: Diagnosis not present

## 2016-11-03 DIAGNOSIS — E118 Type 2 diabetes mellitus with unspecified complications: Secondary | ICD-10-CM

## 2016-11-03 DIAGNOSIS — E1140 Type 2 diabetes mellitus with diabetic neuropathy, unspecified: Secondary | ICD-10-CM | POA: Diagnosis not present

## 2016-11-03 DIAGNOSIS — Z888 Allergy status to other drugs, medicaments and biological substances status: Secondary | ICD-10-CM | POA: Diagnosis not present

## 2016-11-03 DIAGNOSIS — Z8673 Personal history of transient ischemic attack (TIA), and cerebral infarction without residual deficits: Secondary | ICD-10-CM | POA: Insufficient documentation

## 2016-11-03 DIAGNOSIS — L409 Psoriasis, unspecified: Secondary | ICD-10-CM

## 2016-11-03 DIAGNOSIS — F329 Major depressive disorder, single episode, unspecified: Secondary | ICD-10-CM | POA: Insufficient documentation

## 2016-11-03 DIAGNOSIS — E785 Hyperlipidemia, unspecified: Secondary | ICD-10-CM | POA: Insufficient documentation

## 2016-11-03 MED ORDER — LISINOPRIL 10 MG PO TABS: 10.0000 mg | ORAL_TABLET | Freq: Every day | ORAL | 3 refills | Status: DC

## 2016-11-03 MED ORDER — CLOBETASOL PROPIONATE 0.05 % EX CREA: 1.0000 "application " | TOPICAL_CREAM | Freq: Two times a day (BID) | CUTANEOUS | 3 refills | Status: DC

## 2016-11-03 MED FILL — CLOBETASOL 0.05% CREAM: 0.05 | 15 days supply | Qty: 30 | Fill #0

## 2016-11-03 MED FILL — LISINOPRIL 10 MG TABLET: 10 | 90 days supply | Qty: 90 | Fill #0

## 2016-11-03 NOTE — Progress Notes (Signed)
Patient has not eat  Patient has taken medications  Patient CBG 250

## 2016-11-03 NOTE — Progress Notes (Signed)
Deborah Coffey, is a 61 y.o. female  DZH:[id number]  MHD:[id number]  DOB - 1955-12-19  Chief Complaint  Patient presents with  . Follow-up  . Diabetes      Subjective:   Deborah Coffey is a 61 y.o. female with a history of coronary artery disease, previous history of TIA, diabetes mellitus (A1c 13.3% as of 07/15/2015), diabetic neuropathy, hypertension here today for routine follow up visit of diabetes and HTN and medication refills. Patient is not adherent with medications and has shown indiscretion to diet and nutrition since the last follow up. She is on Insulin Lantus, 30 units QHS. She was recently seen in the ED for chest pain, determined to be atypical, she has had a follow up with cardiologist. ECHO showed normal Systolic function and an estimated ejection fraction in the range of 55% to 60%. EKG showed NSR with non-specific T wave abnormality. Patient has no new complaint today. She denies depression, denies any suicidal ideation or thoughts. Patient has No headache, No chest pain, No abdominal pain - No Nausea, No new weakness tingling or numbness, No Cough - SOB.  No problems updated.  ALLERGIES: Allergies  Allergen Reactions  . Ciprofloxacin Rash    PAST MEDICAL HISTORY: Past Medical History:  Diagnosis Date  . Allergy   . Anemia   . CAD in native artery 02/2004   Mid LAD lesoin just after Large D1 --> CABG x 1 LIMA-LAD  . CAP (community acquired pneumonia) 08/07/2014  . Chronic bronchitis ([place])    "I think I get it q yr" (08/07/2014)  . Depression   . High cholesterol   . History of blood transfusion 2005   "related to OHS"   . Hypertension   . 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 competitive flow  . Type II diabetes mellitus ([place])     MEDICATIONS AT HOME: Prior to Admission medications   Medication Sig Start Date End Date Taking? Authorizing Provider  ACCU-CHEK FASTCLIX LANCETS MISC Use as directed  to check blood glucose 3 times daily. E11.9 07/07/16   Tresa Garter, MD  acetaminophen (TYLENOL) 500 MG tablet Take 1,000 mg by mouth every 6 (six) hours as needed for headache (pain).     [provider]  Alcohol Swabs (B-D SINGLE USE SWABS REGULAR) PADS Use as directed 07/07/16   Tresa Garter, MD  aspirin EC 81 MG tablet Take 81 mg by mouth daily.    [provider]  atorvastatin (LIPITOR) 10 MG tablet Take 1 tablet (10 mg total) by mouth daily. 07/15/16   Tresa Garter, MD  Blood Glucose Monitoring Suppl (ACCU-CHEK NANO SMARTVIEW) w/Device KIT Use as directed to check blood glucose 3 times daily. E11.9 07/07/16   Tresa Garter, MD  clobetasol cream (TEMOVATE) 2.11 % Apply 1 application topically 2 (two) times daily. 11/03/16   Tresa Garter, MD  furosemide (LASIX) 20 MG tablet Take 20 mg by mouth daily. 09/05/16   [provider]  gabapentin (NEURONTIN) 300 MG capsule Take 1 capsule (300 mg total) by mouth 3 (three) times daily. 07/07/16   Tresa Garter, MD  glucose blood (ACCU-CHEK SMARTVIEW) test strip Use as instructed to test blood glucose 3 times daily. E11.9 07/15/16   Tresa Garter, MD  hydrochlorothiazide (MICROZIDE) 12.5 MG capsule Take 1 capsule (12.5 mg total) by mouth daily. 07/15/16   Tresa Garter, MD  Insulin Glargine (TOUJEO SOLOSTAR) 300  UNIT/ML SOPN Inject 35 Units into the skin daily with breakfast. Patient taking differently: Inject 37 Units into the skin daily with breakfast.  07/14/16   Tresa Garter, MD  lisinopril (PRINIVIL,ZESTRIL) 10 MG tablet Take 1 tablet (10 mg total) by mouth daily. 11/03/16   Tresa Garter, MD  metFORMIN (GLUCOPHAGE-XR) 500 MG 24 hr tablet TAKE 2 TABLETS BY MOUTH 2 TIMES DAILY WITH A MEAL Patient taking differently: Take 1,000 mg by mouth 2 (two) times daily with a meal.  07/15/16   Britany Callicott, Marlena Clipper, MD  metoprolol (LOPRESSOR) 50 MG tablet Take 1 tablet (50 mg  total) by mouth 2 (two) times daily. 07/15/16   Tresa Garter, MD  Multiple Vitamin (MULTIVITAMIN WITH MINERALS) TABS tablet Take 1 tablet by mouth daily.    [provider]  nitroGLYCERIN (NITROSTAT) 0.4 MG SL tablet Place 1 tablet (0.4 mg total) under the tongue every 5 (five) minutes as needed for chest pain. 06/26/15   Tresa Garter, MD  pantoprazole (PROTONIX) 40 MG tablet Take 1 tablet (40 mg total) by mouth daily. Patient taking differently: Take 40 mg by mouth at bedtime.  07/07/16   Tresa Garter, MD  Vitamin D, Ergocalciferol, (DRISDOL) 50000 units CAPS capsule Take 1 capsule by mouth once a week. 07/16/16   [provider]    Objective:   Vitals:   11/03/16 1113  BP: 134/66  Pulse: 74  Resp: 18  Temp: 97.8 F (36.6 C)  TempSrc: Oral  SpO2: 100%  Weight: 242 lb (109.8 kg)  Height: '5\' 5"'$  (1.651 m)   Exam General appearance : Awake, alert, not in any distress. Speech Clear. Not toxic looking, obese HEENT: Atraumatic and Normocephalic, pupils equally reactive to light and accomodation Neck: Supple, no JVD. No cervical lymphadenopathy.  Chest: Good air entry bilaterally, no added sounds  CVS: S1 S2 regular, no murmurs.  Abdomen: Bowel sounds present, Non tender and not distended with no gaurding, rigidity or rebound. Extremities: B/L Lower Ext shows no edema, both legs are warm to touch Neurology: Awake alert, and oriented X 3, CN II-XII intact, Non focal Skin: No Rash  Data Review Lab Results  Component Value Date   HGBA1C 13.1 (H) 09/02/2016   HGBA1C 13.3 07/14/2016   HGBA1C 8.1 12/12/2015    Assessment & Plan   1. Essential hypertension  - lisinopril (PRINIVIL,ZESTRIL) 10 MG tablet; Take 1 tablet (10 mg total) by mouth daily.  Dispense: 90 tablet; Refill: 3  2. Type 2 diabetes mellitus with complication, with long-term current use of insulin (HCC)  - Increase Lantus Insulin to 35 Units QHS   Aim for 30 minutes of exercise  most days. Rethink what you drink. Water is great! Aim for 2-3 Carb Choices per meal (30-45 grams) +/- 1 either way  Aim for 0-15 Carbs per snack if hungry  Include protein in moderation with your meals and snacks  Consider reading food labels for Total Carbohydrate and Fat Grams of foods  Consider checking BG at alternate times per day  Continue taking medication as directed Be mindful about how much sugar you are adding to beverages and other foods. Fruit Punch - find one with no sugar  Measure and decrease portions of carbohydrate foods  Make your plate and don't go back for seconds   3. Dyslipidemia  To address this please limit saturated fat to no more than 7% of your calories, limit cholesterol to 200 mg/day, increase fiber and exercise as tolerated.  If needed we may add another cholesterol lowering medication to your regimen.   4. Psoriasis of scalp  Prescribe - clobetasol cream (TEMOVATE) 0.05 %; Apply 1 application topically 2 (two) times daily.  Dispense: 30 g; Refill: 3  Patient have been counseled extensively about nutrition and exercise. Other issues discussed during this visit include: low cholesterol diet, weight control and daily exercise, foot care, annual eye examinations at Ophthalmology, importance of adherence with medications and regular follow-up. We also discussed long term complications of uncontrolled diabetes and hypertension.   Return in about 3 months (around 02/03/2017) for Hemoglobin A1C and Follow up, DM, Follow up HTN, Follow up Pain and comorbidities.  The patient was given clear instructions to go to ER or return to medical center if symptoms don't improve, worsen or new problems develop. The patient verbalized understanding. The patient was told to call to get lab results if they haven't heard anything in the next week.   This note has been created with Surveyor, quantity. Any transcriptional errors are  unintentional.    Angelica Chessman, MD, [place], [place], [place], [place] and [place], [place]   11/03/2016, 12:22 PM

## 2016-11-04 ENCOUNTER — Ambulatory Visit (INDEPENDENT_AMBULATORY_CARE_PROVIDER_SITE_OTHER): Payer: Medicare HMO | Admitting: Podiatry

## 2016-11-04 DIAGNOSIS — M79676 Pain in unspecified toe(s): Secondary | ICD-10-CM

## 2016-11-04 DIAGNOSIS — E1142 Type 2 diabetes mellitus with diabetic polyneuropathy: Secondary | ICD-10-CM

## 2016-11-04 DIAGNOSIS — B351 Tinea unguium: Secondary | ICD-10-CM | POA: Diagnosis not present

## 2016-11-05 NOTE — Progress Notes (Signed)
   Subjective:    Patient ID: Deborah Coffey, female    DOB: 09-Dec-1955, 61 y.o.   MRN: [id number]  HPI this patient presents to the office with chief complaint of long thick nails. Patient states the nails are painful walking and wearing her shoes. This patient is diabetic and is taking metformin and gabapentin.  She presents the office today for preventative foot care services and evaluation of her diabetic feet.    Review of Systems  All other systems reviewed and are negative.      Objective:   Physical Exam GENERAL APPEARANCE: Alert, conversant. Appropriately groomed. No acute distress.  VASCULAR: Pedal pulses are  palpable at  [facility] and PT bilateral.  Capillary refill time is immediate to all digits,  Normal temperature gradient.  Digital hair growth is present bilateral  NEUROLOGIC: sensation is diminished  to 5.07 monofilament at 5/5 sites bilateral.  Light touch is intact bilateral, Muscle strength normal.  MUSCULOSKELETAL: acceptable muscle strength, tone and stability bilateral.  Intrinsic muscluature intact bilateral.  Rectus appearance of foot and digits noted bilateral.  NAILS  thick disfigured discolored nails with subungual debris noted bilaterally.  No evidence of any drainage or bacterial infection noted DERMATOLOGIC: skin color, texture, and turgor are within normal limits.  No preulcerative lesions or ulcers  are seen, no interdigital maceration noted.  No open lesions present.  . No drainage noted.         Assessment & Plan:  Onychomycosis  B/L    IE  Debride nails  RTC 3 months.   Gardiner Barefoot DPM

## 2016-11-22 MED FILL — TOUJEO SOLOSTAR 300 UNITS/M: 300 | 30 days supply | Qty: 3 | Fill #9

## 2016-11-22 MED FILL — NULYTELY WITH FLAVOR PACKS: 420 | 1 days supply | Qty: 4000 | Fill #0

## 2016-11-24 DIAGNOSIS — K2980 Duodenitis without bleeding: Secondary | ICD-10-CM | POA: Diagnosis not present

## 2016-11-24 DIAGNOSIS — R1310 Dysphagia, unspecified: Secondary | ICD-10-CM | POA: Diagnosis not present

## 2016-11-24 DIAGNOSIS — K2930 Chronic superficial gastritis without bleeding: Secondary | ICD-10-CM | POA: Diagnosis not present

## 2016-11-24 DIAGNOSIS — B9681 Helicobacter pylori [H. pylori] as the cause of diseases classified elsewhere: Secondary | ICD-10-CM | POA: Diagnosis not present

## 2016-11-24 DIAGNOSIS — K2950 Unspecified chronic gastritis without bleeding: Secondary | ICD-10-CM | POA: Diagnosis not present

## 2016-11-24 DIAGNOSIS — D126 Benign neoplasm of colon, unspecified: Secondary | ICD-10-CM | POA: Diagnosis not present

## 2016-11-24 DIAGNOSIS — K5730 Diverticulosis of large intestine without perforation or abscess without bleeding: Secondary | ICD-10-CM | POA: Diagnosis not present

## 2016-11-24 DIAGNOSIS — Z1211 Encounter for screening for malignant neoplasm of colon: Secondary | ICD-10-CM | POA: Diagnosis not present

## 2016-11-24 DIAGNOSIS — K635 Polyp of colon: Secondary | ICD-10-CM | POA: Diagnosis not present

## 2016-11-24 MED FILL — ?PANTOPRAZOLE SOD DR 40MG: 40 MG | 30 days supply | Qty: 30 | Fill #3

## 2016-11-26 DIAGNOSIS — B9681 Helicobacter pylori [H. pylori] as the cause of diseases classified elsewhere: Secondary | ICD-10-CM | POA: Diagnosis not present

## 2016-11-26 DIAGNOSIS — Z1211 Encounter for screening for malignant neoplasm of colon: Secondary | ICD-10-CM | POA: Diagnosis not present

## 2016-11-26 DIAGNOSIS — K2980 Duodenitis without bleeding: Secondary | ICD-10-CM | POA: Diagnosis not present

## 2016-11-26 DIAGNOSIS — K2950 Unspecified chronic gastritis without bleeding: Secondary | ICD-10-CM | POA: Diagnosis not present

## 2016-11-26 DIAGNOSIS — D126 Benign neoplasm of colon, unspecified: Secondary | ICD-10-CM | POA: Diagnosis not present

## 2016-11-26 DIAGNOSIS — K635 Polyp of colon: Secondary | ICD-10-CM | POA: Diagnosis not present

## 2016-12-21 ENCOUNTER — Other Ambulatory Visit: Payer: Self-pay | Admitting: Pharmacist

## 2016-12-21 ENCOUNTER — Other Ambulatory Visit: Payer: Self-pay | Admitting: Family Medicine

## 2016-12-21 DIAGNOSIS — Z794 Long term (current) use of insulin: Principal | ICD-10-CM

## 2016-12-21 DIAGNOSIS — E118 Type 2 diabetes mellitus with unspecified complications: Secondary | ICD-10-CM

## 2016-12-21 MED ORDER — INSULIN GLARGINE 300 UNIT/ML ~~LOC~~ SOPN: 35.0000 [IU] | PEN_INJECTOR | Freq: Every day | SUBCUTANEOUS | 2 refills | Status: DC

## 2016-12-23 MED FILL — !TOUJEO SOLOSTAR 300 UNITS/: 300/ML | 25 days supply | Qty: 3 | Fill #0

## 2017-02-02 DIAGNOSIS — E113512 Type 2 diabetes mellitus with proliferative diabetic retinopathy with macular edema, left eye: Secondary | ICD-10-CM | POA: Diagnosis not present

## 2017-02-04 ENCOUNTER — Ambulatory Visit: Payer: Medicare HMO | Admitting: Podiatry

## 2017-02-08 ENCOUNTER — Other Ambulatory Visit: Payer: Self-pay | Admitting: Pharmacist

## 2017-02-08 MED ORDER — ACCU-CHEK FASTCLIX LANCETS MISC: 5 refills | Status: DC

## 2017-02-09 ENCOUNTER — Ambulatory Visit: Payer: Self-pay | Admitting: Internal Medicine

## 2017-02-11 ENCOUNTER — Other Ambulatory Visit: Payer: Self-pay | Admitting: Pharmacist

## 2017-02-11 DIAGNOSIS — E118 Type 2 diabetes mellitus with unspecified complications: Secondary | ICD-10-CM

## 2017-02-11 DIAGNOSIS — Z794 Long term (current) use of insulin: Principal | ICD-10-CM

## 2017-02-11 DIAGNOSIS — E1141 Type 2 diabetes mellitus with diabetic mononeuropathy: Secondary | ICD-10-CM

## 2017-02-11 DIAGNOSIS — I10 Essential (primary) hypertension: Secondary | ICD-10-CM

## 2017-02-11 MED ORDER — LISINOPRIL 10 MG PO TABS: 10.0000 mg | ORAL_TABLET | Freq: Every day | ORAL | 0 refills | Status: DC

## 2017-02-11 MED ORDER — GABAPENTIN 300 MG PO CAPS: 300.0000 mg | ORAL_CAPSULE | Freq: Three times a day (TID) | ORAL | 0 refills | Status: DC

## 2017-02-11 MED ORDER — ATORVASTATIN CALCIUM 10 MG PO TABS: 10.0000 mg | ORAL_TABLET | Freq: Every day | ORAL | 0 refills | Status: DC

## 2017-02-11 MED ORDER — PANTOPRAZOLE SODIUM 40 MG PO TBEC: 40.0000 mg | DELAYED_RELEASE_TABLET | Freq: Every day | ORAL | 0 refills | Status: DC

## 2017-02-11 MED ORDER — METFORMIN HCL ER 500 MG PO TB24: ORAL_TABLET | ORAL | 0 refills | Status: DC

## 2017-03-02 ENCOUNTER — Ambulatory Visit: Payer: Self-pay | Admitting: Internal Medicine

## 2017-03-14 DIAGNOSIS — E113513 Type 2 diabetes mellitus with proliferative diabetic retinopathy with macular edema, bilateral: Secondary | ICD-10-CM | POA: Diagnosis not present

## 2017-03-14 DIAGNOSIS — E113512 Type 2 diabetes mellitus with proliferative diabetic retinopathy with macular edema, left eye: Secondary | ICD-10-CM | POA: Diagnosis not present

## 2017-03-14 DIAGNOSIS — E113511 Type 2 diabetes mellitus with proliferative diabetic retinopathy with macular edema, right eye: Secondary | ICD-10-CM | POA: Diagnosis not present

## 2017-03-21 NOTE — Progress Notes (Signed)
Cardiology Office Note   Date:  03/22/2017   ID:  Deborah Coffey, DOB 1955-07-23, MRN [id number]  PCP:  Tresa Garter, MD  Cardiologist:  Dr Marlou Porch   Chief Complaint  Patient presents with  . Coronary Artery Disease      History of Present Illness: Deborah Coffey is a 61 y.o. female who presents for Coronary artery disease status post CABG 1 in 2005  - LIMA to LAD  - Echocardiogram shows normal wall motion. It is doubtful that she has had any problems with her LIMA graft  Was in the ER on 09/17/16 with mid left chest pain, squeezing, no radiation. Cough and lower extremity edema noted.   Was in hospital 2 weeks prior to that for 2 days. There was some concern about facial droop. She is seeing neurology soon.  An echocardiogram was performed that showed pressures of 50. Grade 1 diastolic dysfunction. BNP was mildly elevated at 110. CT scan of chest showed no dissection, no PE.  She sometimes has symptoms of left arm discomfort especially when lifting heavy bags like grocery bags for instance or when raising her arm to brush her hair. She thinks it may be a tendon. She was concerned because she had left arm pain prior to her bypass surgery in 2005.  Troponins were all normal.  No lower extremity edema currently. Continues to smoke.  Today has rare chest discomfort that does not last long.  No SOB.  [facility] is [facility] better.  no chest pain with exercising.   Her HgbA1c was 11 but she is trying to eat better.  Is still smoking half a pk per day and would like to decrease and stop.  She will try to decrease to 5 per day over next month.   BP is controlled.  No hospitalizations since possible TIA.    Past Medical History:  Diagnosis Date  . Allergy   . Anemia   . CAD in native artery 02/2004   Mid LAD lesoin just after Large D1 --> CABG x 1 LIMA-LAD  . CAP (community acquired pneumonia) 08/07/2014  . Chronic bronchitis  ([place])    "I think I get it q yr" (08/07/2014)  . Depression   . High cholesterol   . History of blood transfusion 2005   "related to OHS"   . Hypertension   . 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 competitive flow  . Type II diabetes mellitus ([place])     Past Surgical History:  Procedure Laterality Date  . CARDIAC CATHETERIZATION  2010   Previous LAD 95% lesion - now ~30-40%; patent LIMA with competitive flow  . CESAREAN SECTION  1977; 1989  . CHOLECYSTECTOMY  ~ 2012  . CORONARY ARTERY BYPASS GRAFT  02/2004   "CABG X1" (03/22/2013); LIMA-LAD  . TUBAL LIGATION  1989     Current Outpatient Medications  Medication Sig Dispense Refill  . ACCU-CHEK FASTCLIX LANCETS MISC Use as directed to check blood glucose 3 times daily. E11.9 100 each 5  . acetaminophen (TYLENOL) 500 MG tablet Take 1,000 mg by mouth every 6 (six) hours as needed for headache (pain).     . Alcohol Swabs (B-D SINGLE USE SWABS REGULAR) PADS Use as directed 100 each 5  . aspirin EC 81 MG tablet Take 81 mg by mouth daily.    Marland Kitchen atorvastatin (LIPITOR) 10 MG tablet Take 1 tablet (  10 mg total) by mouth daily. 90 tablet 0  . Blood Glucose Monitoring Suppl (ACCU-CHEK NANO SMARTVIEW) w/Device KIT Use as directed to check blood glucose 3 times daily. E11.9 1 kit 0  . clobetasol cream (TEMOVATE) 2.29 % Apply 1 application topically 2 (two) times daily. 30 g 3  . furosemide (LASIX) 20 MG tablet Take 20 mg by mouth daily.    Marland Kitchen gabapentin (NEURONTIN) 300 MG capsule Take 1 capsule (300 mg total) by mouth 3 (three) times daily. 90 capsule 0  . glucose blood (ACCU-CHEK SMARTVIEW) test strip Use as instructed to test blood glucose 3 times daily. E11.9 100 each 12  . hydrochlorothiazide (MICROZIDE) 12.5 MG capsule Take 1 capsule (12.5 mg total) by mouth daily. 30 capsule 6  . Insulin Glargine (TOUJEO SOLOSTAR) 300 UNIT/ML SOPN Inject 35 Units into the skin daily with breakfast. 13.5 mL 2  .  lisinopril (PRINIVIL,ZESTRIL) 10 MG tablet Take 1 tablet (10 mg total) by mouth daily. 90 tablet 0  . metFORMIN (GLUCOPHAGE-XR) 500 MG 24 hr tablet TAKE 2 TABLETS BY MOUTH 2 TIMES DAILY WITH A MEAL 360 tablet 0  . metoprolol (LOPRESSOR) 50 MG tablet Take 1 tablet (50 mg total) by mouth 2 (two) times daily. 60 tablet 6  . Multiple Vitamin (MULTIVITAMIN WITH MINERALS) TABS tablet Take 1 tablet by mouth daily.    . nitroGLYCERIN (NITROSTAT) 0.4 MG SL tablet Place 1 tablet (0.4 mg total) under the tongue every 5 (five) minutes as needed for chest pain. 30 tablet 3  . pantoprazole (PROTONIX) 40 MG tablet Take 1 tablet (40 mg total) by mouth daily. 90 tablet 0  . Vitamin D, Ergocalciferol, (DRISDOL) 50000 units CAPS capsule Take 1 capsule by mouth once a week.     No current facility-administered medications for this visit.     Allergies:   Ciprofloxacin    Social History:  The patient  reports that she has been smoking cigarettes.  She has a 21.00 pack-year smoking history. she has never used smokeless tobacco. She reports that she drinks about 0.6 - 1.2 oz of alcohol per week. She reports that she does not use drugs.   Family History:  The patient's family history includes Cancer in her mother; Diabetes in her brother, father, and mother; Heart disease in her mother; Heart failure in her brother and sister; Hepatitis C in her sister; Hypertension in her daughter; Lupus in her sister.    ROS:  General:no colds or fevers, + weight loss Skin:no rashes or ulcers HEENT:no blurred vision, no congestion- though has had eye injections for visual changes from diabetes. CV:see HPI PUL:see HPI GI:no diarrhea constipation or melena, no indigestion GU:no hematuria, no dysuria MS:no joint pain, no claudication Neuro:no syncope, no lightheadedness Endo:+ diabetes, no thyroid disease  Wt Readings from Last 3 Encounters:  03/22/17 231 lb 6.4 oz (105 kg)  11/03/16 242 lb (109.8 kg)  09/22/16 244 lb (110.7  kg)     PHYSICAL EXAM: VS:  BP 128/64   Pulse 77   Resp 16   Ht '5\' 5"'$  (1.651 m)   Wt 231 lb 6.4 oz (105 kg)   LMP  (LMP Unknown)   SpO2 98%   BMI 38.51 kg/m  , BMI Body mass index is 38.51 kg/m. General:Pleasant affect, NAD Skin:Warm and dry, brisk capillary refill HEENT:normocephalic, sclera clear, mucus membranes moist Neck:supple, no JVD, no bruits  Heart:S1S2 RRR without murmur, gallup, rub or click Lungs:clear without rales, rhonchi, or wheezes NLG:XQJJH, soft, non  tender, + BS, do not palpate liver spleen or masses Ext:no lower ext edema, 2+ pedal pulses, 2+ radial pulses Neuro:alert and oriented X 3, MAE, follows commands, + facial symmetry    EKG:  EKG is NOT ordered today.    Recent Labs: 09/02/2016: ALT 14 09/17/2016: B Natriuretic Peptide 118.5; BUN 16; Creatinine, Ser 1.07; Hemoglobin 9.6; Platelets 320; Potassium 4.3; Sodium 138    Lipid Panel    Component Value Date/Time   CHOL 137 09/02/2016 0533   CHOL 167 07/14/2016 1028   TRIG 67 09/02/2016 0533   HDL 61 09/02/2016 0533   HDL 71 07/14/2016 1028   CHOLHDL 2.2 09/02/2016 0533   VLDL 13 09/02/2016 0533   LDLCALC 63 09/02/2016 0533   LDLCALC 77 07/14/2016 1028       Other studies Reviewed: Additional studies/ records that were reviewed today include: . Echo 09/03/16 Study Conclusions  - Left ventricle: The cavity size was normal. Wall thickness was   normal. Systolic function was normal. The estimated ejection   fraction was in the range of 55% to 60%. Wall motion was normal;   there were no regional wall motion abnormalities. Doppler   parameters are consistent with abnormal left ventricular   relaxation (grade 1 diastolic dysfunction). - Pulmonary arteries: Systolic pressure was moderately increased.   PA peak pressure: 50 mm Hg (S).  Impressions:  - Normal LV systolic function; mild diastolic dysfunction; mild TR;   moderately elevated pulmonary pressure.  ASSESSMENT AND  PLAN:  1.  CAD with hx CABG, no to rare chest pain, has not needed NTG, but is out and we will refill.  No pain with exercise.  Follow up with Dr. Marlou Porch in 6 months and prn.  2.  Tobacco use we discussed decreasing amount or just stopping cold [place].  3.  HLD on lipitor 10.  4.  DM-2 per PCP, has been elevated   5.  Pulmonary HTN  Stop tobacco continue exercise  6. Obesity continue exercise and wt loss   Current medicines are reviewed with the patient today.  The patient Has no concerns regarding medicines.  The following changes have been made:  See above Labs/ tests ordered today include:see above  Disposition:   FU:  see above  Signed, Cecilie Kicks, NP  03/22/2017 11:09 AM    [address] [facility], [place], [address], [place] Phone: [phone]; Fax: [phone]

## 2017-03-22 ENCOUNTER — Ambulatory Visit: Payer: Medicare HMO | Admitting: Cardiology

## 2017-03-22 ENCOUNTER — Encounter: Payer: Self-pay | Admitting: Cardiology

## 2017-03-22 VITALS — BP 128/64 | HR 77 | Resp 16 | Ht 65.0 in | Wt 231.4 lb

## 2017-03-22 DIAGNOSIS — D649 Anemia, unspecified: Secondary | ICD-10-CM

## 2017-03-22 DIAGNOSIS — Z720 Tobacco use: Secondary | ICD-10-CM | POA: Diagnosis not present

## 2017-03-22 DIAGNOSIS — E119 Type 2 diabetes mellitus without complications: Secondary | ICD-10-CM

## 2017-03-22 DIAGNOSIS — I2510 Atherosclerotic heart disease of native coronary artery without angina pectoris: Secondary | ICD-10-CM | POA: Diagnosis not present

## 2017-03-22 DIAGNOSIS — I10 Essential (primary) hypertension: Secondary | ICD-10-CM | POA: Diagnosis not present

## 2017-03-22 LAB — CBC
Hematocrit: 31.2 % — ABNORMAL LOW (ref 34.0–46.6)
Hemoglobin: 10.3 g/dL — ABNORMAL LOW (ref 11.1–15.9)
MCH: 27 pg (ref 26.6–33.0)
MCHC: 33 g/dL (ref 31.5–35.7)
MCV: 82 fL (ref 79–97)
PLATELETS: 315 10*3/uL (ref 150–379)
RBC: 3.81 x10E6/uL (ref 3.77–5.28)
RDW: 17 % — AB (ref 12.3–15.4)
WBC: 8 10*3/uL (ref 3.4–10.8)

## 2017-03-22 MED ORDER — NITROGLYCERIN 0.4 MG SL SUBL: 0.4000 mg | SUBLINGUAL_TABLET | SUBLINGUAL | 3 refills | Status: DC | PRN

## 2017-03-22 NOTE — Patient Instructions (Signed)
Medication Instructions:    Your physician recommends that you continue on your current medications as directed. Please refer to the Current Medication list given to you today.    If you need a refill on your cardiac medications before your next appointment, please call your pharmacy.  Labwork:  CBC TODAY    Testing/Procedures: NONE ORDERED  TODAY '  Follow-Up:  Your physician wants you to follow-up in:  IN  6  MONTHS WITH DR  Marlou Porch. You will receive a reminder letter in the mail two months in advance. If you don't receive a letter, please call our office to schedule the follow-up appointment.      Any Other Special Instructions Will Be Listed Below (If Applicable).

## 2017-03-24 MED FILL — TOUJEO SOLOSTAR 300 UNITS/M: 300 | 25 days supply | Qty: 5 | Fill #1

## 2017-04-01 ENCOUNTER — Ambulatory Visit: Payer: Medicare HMO | Admitting: Podiatry

## 2017-04-11 ENCOUNTER — Other Ambulatory Visit: Payer: Self-pay | Admitting: Internal Medicine

## 2017-04-11 DIAGNOSIS — I10 Essential (primary) hypertension: Secondary | ICD-10-CM

## 2017-04-18 ENCOUNTER — Other Ambulatory Visit: Payer: Self-pay | Admitting: Internal Medicine

## 2017-04-18 DIAGNOSIS — E118 Type 2 diabetes mellitus with unspecified complications: Secondary | ICD-10-CM

## 2017-04-18 DIAGNOSIS — Z794 Long term (current) use of insulin: Secondary | ICD-10-CM

## 2017-04-18 DIAGNOSIS — I10 Essential (primary) hypertension: Secondary | ICD-10-CM

## 2017-04-21 DIAGNOSIS — E113511 Type 2 diabetes mellitus with proliferative diabetic retinopathy with macular edema, right eye: Secondary | ICD-10-CM | POA: Diagnosis not present

## 2017-04-25 DIAGNOSIS — E113512 Type 2 diabetes mellitus with proliferative diabetic retinopathy with macular edema, left eye: Secondary | ICD-10-CM | POA: Diagnosis not present

## 2017-04-25 MED FILL — TIMOLOL 0.5% EYE DROPS: 0.5 | 37 days supply | Qty: 5 | Fill #0

## 2017-04-27 ENCOUNTER — Ambulatory Visit: Payer: Medicare HMO | Admitting: Podiatry

## 2017-04-27 ENCOUNTER — Other Ambulatory Visit: Payer: Self-pay | Admitting: Internal Medicine

## 2017-04-27 DIAGNOSIS — E1141 Type 2 diabetes mellitus with diabetic mononeuropathy: Secondary | ICD-10-CM

## 2017-04-27 DIAGNOSIS — E118 Type 2 diabetes mellitus with unspecified complications: Secondary | ICD-10-CM

## 2017-04-27 DIAGNOSIS — I10 Essential (primary) hypertension: Secondary | ICD-10-CM

## 2017-04-27 DIAGNOSIS — Z794 Long term (current) use of insulin: Secondary | ICD-10-CM

## 2017-05-04 ENCOUNTER — Ambulatory Visit: Payer: Medicare HMO | Attending: Internal Medicine | Admitting: Internal Medicine

## 2017-05-04 ENCOUNTER — Other Ambulatory Visit: Payer: Self-pay

## 2017-05-04 VITALS — BP 140/86 | Temp 98.8°F | Ht 68.0 in | Wt 231.6 lb

## 2017-05-04 DIAGNOSIS — F17200 Nicotine dependence, unspecified, uncomplicated: Secondary | ICD-10-CM

## 2017-05-04 DIAGNOSIS — L409 Psoriasis, unspecified: Secondary | ICD-10-CM | POA: Diagnosis not present

## 2017-05-04 DIAGNOSIS — Z79899 Other long term (current) drug therapy: Secondary | ICD-10-CM | POA: Diagnosis not present

## 2017-05-04 DIAGNOSIS — Z794 Long term (current) use of insulin: Secondary | ICD-10-CM

## 2017-05-04 DIAGNOSIS — I2510 Atherosclerotic heart disease of native coronary artery without angina pectoris: Secondary | ICD-10-CM | POA: Diagnosis not present

## 2017-05-04 DIAGNOSIS — I110 Hypertensive heart disease with heart failure: Secondary | ICD-10-CM | POA: Diagnosis not present

## 2017-05-04 DIAGNOSIS — E118 Type 2 diabetes mellitus with unspecified complications: Secondary | ICD-10-CM | POA: Diagnosis not present

## 2017-05-04 DIAGNOSIS — Z7982 Long term (current) use of aspirin: Secondary | ICD-10-CM | POA: Diagnosis not present

## 2017-05-04 DIAGNOSIS — I10 Essential (primary) hypertension: Secondary | ICD-10-CM | POA: Diagnosis not present

## 2017-05-04 DIAGNOSIS — E785 Hyperlipidemia, unspecified: Secondary | ICD-10-CM | POA: Diagnosis not present

## 2017-05-04 DIAGNOSIS — I509 Heart failure, unspecified: Secondary | ICD-10-CM | POA: Insufficient documentation

## 2017-05-04 LAB — POCT CBG (FASTING - GLUCOSE)-MANUAL ENTRY: GLUCOSE FASTING, POC: 87 mg/dL (ref 70–99)

## 2017-05-04 LAB — POCT GLYCOSYLATED HEMOGLOBIN (HGB A1C): HEMOGLOBIN A1C: 7.8

## 2017-05-04 MED ORDER — CLOBETASOL PROPIONATE 0.05 % EX CREA: 1.0000 "application " | TOPICAL_CREAM | Freq: Two times a day (BID) | CUTANEOUS | 3 refills | Status: DC

## 2017-05-04 NOTE — Progress Notes (Signed)
F/u DM Discuss colonoscopy

## 2017-05-04 NOTE — Progress Notes (Signed)
Subjective:  Patient ID: Deborah Coffey, female    DOB: [birth year]  Age: 62 y.o. MRN: [id number]  CC: Diabetes  HPI Deborah Coffey is a 62 year old female with a history of HTN, DM, CHF and CAD that presents today for follow-up. She states that she has been feeling well overall and has followed up with cardiology and podiatry this year. She also states that she had a colonsocopy done in 2018 and had to get "something removed from her esophagus that could have been cancer". She was requesting further information regarding her results. She admits to having ocassional hypoglycemic episodes in the am. But states her blood sugars in the mornings are in the 70's. She states that she eats dinner before 7pm. She has made an effort to improve her diet but has not been able to quit smoking.  She denies SOB, chest pain, numbness or weakness.   Outpatient Medications Prior to Visit  Medication Sig Dispense Refill  . acetaminophen (TYLENOL) 500 MG tablet Take 1,000 mg by mouth every 6 (six) hours as needed for headache (pain).     Marland Kitchen aspirin EC 81 MG tablet Take 81 mg by mouth daily.    Marland Kitchen atorvastatin (LIPITOR) 10 MG tablet TAKE 1 TABLET EVERY DAY 90 tablet 0  . furosemide (LASIX) 20 MG tablet TAKE 1 TABLET (20 MG TOTAL) BY MOUTH DAILY. 90 tablet 0  . gabapentin (NEURONTIN) 300 MG capsule TAKE 1 CAPSULE THREE TIMES DAILY 90 capsule 0  . glucose blood (ACCU-CHEK SMARTVIEW) test strip Use as instructed to test blood glucose 3 times daily. E11.9 100 each 12  . hydrochlorothiazide (MICROZIDE) 12.5 MG capsule TAKE 1 CAPSULE (12.5 MG TOTAL) BY MOUTH DAILY. 90 capsule 6  . Insulin Glargine (TOUJEO SOLOSTAR) 300 UNIT/ML SOPN Inject 35 Units into the skin daily with breakfast. 13.5 mL 2  . lisinopril (PRINIVIL,ZESTRIL) 10 MG tablet TAKE 1 TABLET (10 MG TOTAL) BY MOUTH DAILY. 30 tablet 0  . metFORMIN (GLUCOPHAGE-XR) 500 MG 24 hr tablet TAKE 2 TABLETS BY MOUTH 2 TIMES DAILY WITH A MEAL 120 tablet 0  .  metoprolol tartrate (LOPRESSOR) 50 MG tablet TAKE 1 TABLET TWICE DAILY 60 tablet 0  . Multiple Vitamin (MULTIVITAMIN WITH MINERALS) TABS tablet Take 1 tablet by mouth daily.    . nitroGLYCERIN (NITROSTAT) 0.4 MG SL tablet Place 1 tablet (0.4 mg total) under the tongue every 5 (five) minutes as needed for chest pain. 25 tablet 3  . pantoprazole (PROTONIX) 40 MG tablet TAKE 1 TABLET (40 MG TOTAL) BY MOUTH DAILY. 90 tablet 0  . clobetasol cream (TEMOVATE) 1.82 % Apply 1 application topically 2 (two) times daily. 30 g 3  . ACCU-CHEK FASTCLIX LANCETS MISC Use as directed to check blood glucose 3 times daily. E11.9 100 each 5  . Alcohol Swabs (B-D SINGLE USE SWABS REGULAR) PADS USE AS DIRECTED 100 each 0  . Blood Glucose Monitoring Suppl (ACCU-CHEK NANO SMARTVIEW) w/Device KIT Use as directed to check blood glucose 3 times daily. E11.9 1 kit 0  . Vitamin D, Ergocalciferol, (DRISDOL) 50000 units CAPS capsule Take 1 capsule by mouth once a week.     No facility-administered medications prior to visit.     ROS Review of Systems  General: negative for fever, weight loss, appetite change Eyes: She states she is following an opthamologist for lasik eye surgery.  ENT: no ear symptoms, no sinus tenderness, no nasal congestion or sore throat. Respiratory: no wheezing, shortness of breath, cough Cardiovascular: no  chest pain, no dyspnea on exertion, no pedal edema, no orthopnea. Gastrointestinal: no abdominal pain, no diarrhea, no constipation Genito-Urinary: no urinary frequency, no dysuria, no polyuria. Neurological: no headaches, no seizures, no tremors Musculoskeletal: no joint pains, no joint swelling Skin: admits to chronic psoriatic rash  Psychological: no depression, no anxiety. Admits to some stress at home from taking care of her grandkids who are 3, 5, and 10.   Objective:  BP 140/86 (BP Location: Right Arm, Patient Position: Sitting, Cuff Size: Normal)   Temp 98.8 F (37.1 C) (Oral)   Ht  '5\' 8"'$  (1.727 m)   Wt 231 lb 9.6 oz (105.1 kg)   LMP  (LMP Unknown)   SpO2 96%   BMI 35.21 kg/m   BP/Weight 05/04/2017 03/22/2017 0/86/5784  Systolic BP 696 295 284  Diastolic BP 86 64 66  Wt. (Lbs) 231.6 231.4 242  BMI 35.21 38.51 40.27   Lab Results  Component Value Date   HGBA1C 7.8 05/04/2017   Physical Exam Constitutional: pleasant, normal appearing Eyes: PERRL Skin:: Patient has dry scaly patches of skin on scalp, right elbow, right leg, and under her breast. HEENT: Head is atraumatic, psoriatic patches on scalp. Clear and moist oropharynx. Neck: normal range of motion Cardiovascular: normal rate and rhythm, normal heart sounds, no swelling of extremities Respiratory: clear to auscultation bilaterally, no wheezes, no rales, no rhonchi Abdomen: soft, not tender to palpation, normal bowel sounds, no enlarged organs Extremities: Full ROM, no tenderness in joints Neurological: alert, oriented x3, cranial nerves I-XII grossly intact , normal motor strength, normal sensation.  Assessment & Plan:   1. Type 2 diabetes mellitus with complication, with long-term current use of insulin (HCC) Discussed eating a small 15g carb snack before bedtime to reduce early morning hypoglycemic events. - Glucose (CBG), Fasting - HgB A1c Aim for 30 minutes of exercise most days. Rethink what you drink. Water is great! Aim for 2-3 Carb Choices per meal (30-45 grams) +/- 1 either way  Aim for 0-15 Carbs per snack if hungry  Include protein in moderation with your meals and snacks  Consider reading food labels for Total Carbohydrate and Fat Grams of foods  Consider checking BG at alternate times per day  Continue taking medication as directed Be mindful about how much sugar you are adding to beverages and other foods. Fruit Punch - find one with no sugar  Measure and decrease portions of carbohydrate foods  Make your plate and don't go back for seconds  2. Essential hypertension Discussed  stress reduction techniques and smoking cessation.  Continue current BP medications and follow-up with cardiology as needed.  3. Dyslipidemia Continue Atorvastatin.  4. Psoriasis of scalp - Ambulatory referral to Dermatology - clobetasol cream (TEMOVATE) 0.05 %; Apply 1 application topically 2 (two) times daily.  Dispense: 60 g; Refill: 3  5. Smoking addiction Laneisha was counseled on the dangers of tobacco use, and was advised to quit. Reviewed strategies to maximize success, including removing cigarettes and smoking materials from environment, stress management and support of family/friends.  Meds ordered this encounter  Medications  . clobetasol cream (TEMOVATE) 0.05 %    Sig: Apply 1 application topically 2 (two) times daily.    Dispense:  60 g    Refill:  3   Follow-up: Return in about 3 months (around 08/02/2017) for Hemoglobin A1C and Follow up, DM, Follow up HTN, Follow up Pain and comorbidities.   Tresa Garter MD  Evaluation and management procedures were performed  by me with DNP Student in attendance, note written by DNP student under my supervision and collaboration. I have reviewed the note and I agree with the management and plan.  Angelica Chessman, MD, [place], [place], Karilyn Cota  [facility] and [facility] [place], [place]   05/07/2017, 5:39 PM

## 2017-05-04 NOTE — Patient Instructions (Signed)
Psoriasis Psoriasis is a long-term (chronic) skin condition. It causes raised, red patches (plaques) on your skin that look silvery. The red patches may show up anywhere on your body. They can be any size or shape. Psoriasis can come and go. It can range from mild to very bad. It cannot be passed from one person to another (not contagious). There is no cure for this condition, but it can be helped with treatment. Follow these instructions at home: Skin Care  Apply moisturizers to your skin as needed. Only use those that your doctor has said are okay.  Apply cool compresses to the affected areas.  Do not scratch your skin. Lifestyle   Do not use tobacco products. This includes cigarettes, chewing tobacco, and e-cigarettes. If you need help quitting, ask your doctor.  Drink little or no alcohol.  Try to lower your stress. Meditation or yoga may help.  Get sun as told by your doctor. Do not get sunburned.  Think about joining a psoriasis support group. Medicines  Take or use over-the-counter and prescription medicines only as told by your doctor.  If you were prescribed an antibiotic, take or use it as told by your doctor. Do not stop taking the antibiotic even if your condition starts to get better. General instructions  Keep a journal. Use this to help track what triggers an outbreak. Try to avoid any triggers.  See a counselor or social worker if you feel very sad, upset, or hopeless about your condition and these feelings affect your work or relationships.  Keep all follow-up visits as told by your doctor. This is important. Contact a doctor if:  Your pain gets worse.  You have more redness or warmth in the affected areas.  You have new pain or stiffness in your joints.  Your pain or stiffness in your joints gets worse.  Your nails start to break easily.  Your nails pull away from the nail bed easily.  You have a fever.  You feel very sad (depressed). This  information is not intended to replace advice given to you by your health care provider. Make sure you discuss any questions you have with your health care provider. Document Released: 05/13/2004 Document Revised: 09/11/2015 Document Reviewed: 08/21/2014 Elsevier Interactive Patient Education  2018 [employer]. Diabetes and Foot Care Diabetes may cause you to have problems because of poor blood supply (circulation) to your feet and legs. This may cause the skin on your feet to become thinner, break easier, and heal more slowly. Your skin may become dry, and the skin may peel and crack. You may also have nerve damage in your legs and feet causing decreased feeling in them. You may not notice minor injuries to your feet that could lead to infections or more serious problems. Taking care of your feet is one of the most important things you can do for yourself. Follow these instructions at home:  Wear shoes at all times, even in the house. Do not go barefoot. Bare feet are easily injured.  Check your feet daily for blisters, cuts, and redness. If you cannot see the bottom of your feet, use a mirror or ask someone for help.  Wash your feet with warm water (do not use hot water) and mild soap. Then pat your feet and the areas between your toes until they are completely dry. Do not soak your feet as this can dry your skin.  Apply a moisturizing lotion or petroleum jelly (that does not contain alcohol  and is unscented) to the skin on your feet and to dry, brittle toenails. Do not apply lotion between your toes.  Trim your toenails straight across. Do not dig under them or around the cuticle. File the edges of your nails with an emery board or nail file.  Do not cut corns or calluses or try to remove them with medicine.  Wear clean socks or stockings every day. Make sure they are not too tight. Do not wear knee-high stockings since they may decrease blood flow to your legs.  Wear shoes that fit  properly and have enough cushioning. To break in new shoes, wear them for just a few hours a day. This prevents you from injuring your feet. Always look in your shoes before you put them on to be sure there are no objects inside.  Do not cross your legs. This may decrease the blood flow to your feet.  If you find a minor scrape, cut, or break in the skin on your feet, keep it and the skin around it clean and dry. These areas may be cleansed with mild soap and water. Do not cleanse the area with peroxide, alcohol, or iodine.  When you remove an adhesive bandage, be sure not to damage the skin around it.  If you have a wound, look at it several times a day to make sure it is healing.  Do not use heating pads or hot water bottles. They may burn your skin. If you have lost feeling in your feet or legs, you may not know it is happening until it is too late.  Make sure your health care provider performs a complete foot exam at least annually or more often if you have foot problems. Report any cuts, sores, or bruises to your health care provider immediately. Contact a health care provider if:  You have an injury that is not healing.  You have cuts or breaks in the skin.  You have an ingrown nail.  You notice redness on your legs or feet.  You feel burning or tingling in your legs or feet.  You have pain or cramps in your legs and feet.  Your legs or feet are numb.  Your feet always feel cold. Get help right away if:  There is increasing redness, swelling, or pain in or around a wound.  There is a red line that goes up your leg.  Pus is coming from a wound.  You develop a fever or as directed by your health care provider.  You notice a bad smell coming from an ulcer or wound. This information is not intended to replace advice given to you by your health care provider. Make sure you discuss any questions you have with your health care provider. Document Released: 04/02/2000 Document  Revised: 09/11/2015 Document Reviewed: 09/12/2012 Elsevier Interactive Patient Education  2017 August. Diabetes Mellitus and Nutrition When you have diabetes (diabetes mellitus), it is very important to have healthy eating habits because your blood sugar (glucose) levels are greatly affected by what you eat and drink. Eating healthy foods in the appropriate amounts, at about the same times every day, can help you:  Control your blood glucose.  Lower your risk of heart disease.  Improve your blood pressure.  Reach or maintain a healthy weight.  Every person with diabetes is different, and each person has different needs for a meal plan. Your health care provider may recommend that you work with a diet and nutrition specialist (dietitian)  to make a meal plan that is best for you. Your meal plan may vary depending on factors such as:  The calories you need.  The medicines you take.  Your weight.  Your blood glucose, blood pressure, and cholesterol levels.  Your activity level.  Other health conditions you have, such as heart or kidney disease.  How do carbohydrates affect me? Carbohydrates affect your blood glucose level more than any other type of food. Eating carbohydrates naturally increases the amount of glucose in your blood. Carbohydrate counting is a method for keeping track of how many carbohydrates you eat. Counting carbohydrates is important to keep your blood glucose at a healthy level, especially if you use insulin or take certain oral diabetes medicines. It is important to know how many carbohydrates you can safely have in each meal. This is different for every person. Your dietitian can help you calculate how many carbohydrates you should have at each meal and for snack. Foods that contain carbohydrates include:  Bread, cereal, rice, pasta, and crackers.  Potatoes and corn.  Peas, beans, and lentils.  Milk and yogurt.  Fruit and juice.  Desserts, such as  cakes, cookies, ice cream, and candy.  How does alcohol affect me? Alcohol can cause a sudden decrease in blood glucose (hypoglycemia), especially if you use insulin or take certain oral diabetes medicines. Hypoglycemia can be a life-threatening condition. Symptoms of hypoglycemia (sleepiness, dizziness, and confusion) are similar to symptoms of having too much alcohol. If your health care provider says that alcohol is safe for you, follow these guidelines:  Limit alcohol intake to no more than 1 drink per day for nonpregnant women and 2 drinks per day for men. One drink equals 12 oz of beer, 5 oz of wine, or 1 oz of hard liquor.  Do not drink on an empty stomach.  Keep yourself hydrated with water, diet soda, or unsweetened iced tea.  Keep in mind that regular soda, juice, and other mixers may contain a lot of sugar and must be counted as carbohydrates.  What are tips for following this plan? Reading food labels  Start by checking the serving size on the label. The amount of calories, carbohydrates, fats, and other nutrients listed on the label are based on one serving of the food. Many foods contain more than one serving per package.  Check the total grams (g) of carbohydrates in one serving. You can calculate the number of servings of carbohydrates in one serving by dividing the total carbohydrates by 15. For example, if a food has 30 g of total carbohydrates, it would be equal to 2 servings of carbohydrates.  Check the number of grams (g) of saturated and trans fats in one serving. Choose foods that have low or no amount of these fats.  Check the number of milligrams (mg) of sodium in one serving. Most people should limit total sodium intake to less than 2,300 mg per day.  Always check the nutrition information of foods labeled as "low-fat" or "nonfat". These foods may be higher in added sugar or refined carbohydrates and should be avoided.  Talk to your dietitian to identify your  daily goals for nutrients listed on the label. Shopping  Avoid buying canned, premade, or processed foods. These foods tend to be high in fat, sodium, and added sugar.  Shop around the outside edge of the grocery store. This includes fresh fruits and vegetables, bulk grains, fresh meats, and fresh dairy. Cooking  Use low-heat cooking methods, such  as baking, instead of high-heat cooking methods like deep frying.  Cook using healthy oils, such as olive, canola, or sunflower oil.  Avoid cooking with butter, cream, or high-fat meats. Meal planning  Eat meals and snacks regularly, preferably at the same times every day. Avoid going long periods of time without eating.  Eat foods high in fiber, such as fresh fruits, vegetables, beans, and whole grains. Talk to your dietitian about how many servings of carbohydrates you can eat at each meal.  Eat 4-6 ounces of lean protein each day, such as lean meat, chicken, fish, eggs, or tofu. 1 ounce is equal to 1 ounce of meat, chicken, or fish, 1 egg, or 1/4 cup of tofu.  Eat some foods each day that contain healthy fats, such as avocado, nuts, seeds, and fish. Lifestyle   Check your blood glucose regularly.  Exercise at least 30 minutes 5 or more days each week, or as told by your health care provider.  Take medicines as told by your health care provider.  Do not use any products that contain nicotine or tobacco, such as cigarettes and e-cigarettes. If you need help quitting, ask your health care provider.  Work with a Social worker or diabetes educator to identify strategies to manage stress and any emotional and social challenges. What are some questions to ask my health care provider?  Do I need to meet with a diabetes educator?  Do I need to meet with a dietitian?  What number can I call if I have questions?  When are the best times to check my blood glucose? Where to find more information:  American Diabetes Association:  diabetes.org/food-and-fitness/food  Academy of Nutrition and Dietetics: PokerClues.dk  [employer] of Diabetes and Digestive and Kidney Diseases (NIH): ContactWire.be Summary  A healthy meal plan will help you control your blood glucose and maintain a healthy lifestyle.  Working with a diet and nutrition specialist (dietitian) can help you make a meal plan that is best for you.  Keep in mind that carbohydrates and alcohol have immediate effects on your blood glucose levels. It is important to count carbohydrates and to use alcohol carefully. This information is not intended to replace advice given to you by your health care provider. Make sure you discuss any questions you have with your health care provider. Document Released: 12/31/2004 Document Revised: 05/10/2016 Document Reviewed: 05/10/2016 Elsevier Interactive Patient Education  2018 [employer]. Diabetes Mellitus and Exercise Exercising regularly is important for your overall health, especially when you have diabetes (diabetes mellitus). Exercising is not only about losing weight. It has many health benefits, such as increasing muscle strength and bone density and reducing body fat and stress. This leads to improved fitness, flexibility, and endurance, all of which result in better overall health. Exercise has additional benefits for people with diabetes, including:  Reducing appetite.  Helping to lower and control blood glucose.  Lowering blood pressure.  Helping to control amounts of fatty substances (lipids) in the blood, such as cholesterol and triglycerides.  Helping the body to respond better to insulin (improving insulin sensitivity).  Reducing how much insulin the body needs.  Decreasing the risk for heart disease by: ? Lowering cholesterol and triglyceride levels. ? Increasing the levels  of good cholesterol. ? Lowering blood glucose levels.  What is my activity plan? Your health care provider or certified diabetes educator can help you make a plan for the type and frequency of exercise (activity plan) that works for you. Make sure that  you:  Do at least 150 minutes of moderate-intensity or vigorous-intensity exercise each week. This could be brisk walking, biking, or water aerobics. ? Do stretching and strength exercises, such as yoga or weightlifting, at least 2 times a week. ? Spread out your activity over at least 3 days of the week.  Get some form of physical activity every day. ? Do not go more than 2 days in a row without some kind of physical activity. ? Avoid being inactive for more than 90 minutes at a time. Take frequent breaks to walk or stretch.  Choose a type of exercise or activity that you enjoy, and set realistic goals.  Start slowly, and gradually increase the intensity of your exercise over time.  What do I need to know about managing my diabetes?  Check your blood glucose before and after exercising. ? If your blood glucose is higher than 240 mg/dL (13.3 mmol/L) before you exercise, check your urine for ketones. If you have ketones in your urine, do not exercise until your blood glucose returns to normal.  Know the symptoms of low blood glucose (hypoglycemia) and how to treat it. Your risk for hypoglycemia increases during and after exercise. Common symptoms of hypoglycemia can include: ? Hunger. ? Anxiety. ? Sweating and feeling clammy. ? Confusion. ? Dizziness or feeling light-headed. ? Increased heart rate or palpitations. ? Blurry vision. ? Tingling or numbness around the mouth, lips, or tongue. ? Tremors or shakes. ? Irritability.  Keep a rapid-acting carbohydrate snack available before, during, and after exercise to help prevent or treat hypoglycemia.  Avoid injecting insulin into areas of the body that are going to be exercised. For  example, avoid injecting insulin into: ? The arms, when playing tennis. ? The legs, when jogging.  Keep records of your exercise habits. Doing this can help you and your health care provider adjust your diabetes management plan as needed. Write down: ? Food that you eat before and after you exercise. ? Blood glucose levels before and after you exercise. ? The type and amount of exercise you have done. ? When your insulin is expected to peak, if you use insulin. Avoid exercising at times when your insulin is peaking.  When you start a new exercise or activity, work with your health care provider to make sure the activity is safe for you, and to adjust your insulin, medicines, or food intake as needed.  Drink plenty of water while you exercise to prevent dehydration or heat stroke. Drink enough fluid to keep your urine clear or pale yellow. This information is not intended to replace advice given to you by your health care provider. Make sure you discuss any questions you have with your health care provider. Document Released: 06/26/2003 Document Revised: 10/24/2015 Document Reviewed: 09/15/2015 Elsevier Interactive Patient Education  2018 [employer].

## 2017-05-20 DIAGNOSIS — H2513 Age-related nuclear cataract, bilateral: Secondary | ICD-10-CM | POA: Diagnosis not present

## 2017-05-20 DIAGNOSIS — H40031 Anatomical narrow angle, right eye: Secondary | ICD-10-CM | POA: Diagnosis not present

## 2017-05-20 DIAGNOSIS — E113592 Type 2 diabetes mellitus with proliferative diabetic retinopathy without macular edema, left eye: Secondary | ICD-10-CM | POA: Diagnosis not present

## 2017-05-20 DIAGNOSIS — E113491 Type 2 diabetes mellitus with severe nonproliferative diabetic retinopathy without macular edema, right eye: Secondary | ICD-10-CM | POA: Diagnosis not present

## 2017-05-24 MED FILL — TOUJEO SOLOSTAR 300 UNITS/M: 300 | 37 days supply | Qty: 5 | Fill #2

## 2017-05-30 DIAGNOSIS — H40033 Anatomical narrow angle, bilateral: Secondary | ICD-10-CM | POA: Diagnosis not present

## 2017-05-30 DIAGNOSIS — E113491 Type 2 diabetes mellitus with severe nonproliferative diabetic retinopathy without macular edema, right eye: Secondary | ICD-10-CM | POA: Diagnosis not present

## 2017-05-30 DIAGNOSIS — H2513 Age-related nuclear cataract, bilateral: Secondary | ICD-10-CM | POA: Diagnosis not present

## 2017-05-30 DIAGNOSIS — E113592 Type 2 diabetes mellitus with proliferative diabetic retinopathy without macular edema, left eye: Secondary | ICD-10-CM | POA: Diagnosis not present

## 2017-06-03 DIAGNOSIS — E113511 Type 2 diabetes mellitus with proliferative diabetic retinopathy with macular edema, right eye: Secondary | ICD-10-CM | POA: Diagnosis not present

## 2017-06-20 DIAGNOSIS — E113511 Type 2 diabetes mellitus with proliferative diabetic retinopathy with macular edema, right eye: Secondary | ICD-10-CM | POA: Diagnosis not present

## 2017-06-27 DIAGNOSIS — E113512 Type 2 diabetes mellitus with proliferative diabetic retinopathy with macular edema, left eye: Secondary | ICD-10-CM | POA: Diagnosis not present

## 2017-06-28 ENCOUNTER — Other Ambulatory Visit: Payer: Self-pay | Admitting: Internal Medicine

## 2017-06-28 DIAGNOSIS — E118 Type 2 diabetes mellitus with unspecified complications: Secondary | ICD-10-CM

## 2017-06-28 DIAGNOSIS — Z794 Long term (current) use of insulin: Principal | ICD-10-CM

## 2017-06-28 DIAGNOSIS — I10 Essential (primary) hypertension: Secondary | ICD-10-CM

## 2017-06-28 MED FILL — TOUJEO SOLOSTAR 300 UNITS/M: 300 | 37 days supply | Qty: 5 | Fill #0

## 2017-07-13 ENCOUNTER — Other Ambulatory Visit: Payer: Self-pay | Admitting: Internal Medicine

## 2017-07-13 DIAGNOSIS — I10 Essential (primary) hypertension: Secondary | ICD-10-CM

## 2017-07-22 DIAGNOSIS — E113511 Type 2 diabetes mellitus with proliferative diabetic retinopathy with macular edema, right eye: Secondary | ICD-10-CM | POA: Diagnosis not present

## 2017-08-01 DIAGNOSIS — H25813 Combined forms of age-related cataract, bilateral: Secondary | ICD-10-CM | POA: Diagnosis not present

## 2017-08-01 DIAGNOSIS — E113512 Type 2 diabetes mellitus with proliferative diabetic retinopathy with macular edema, left eye: Secondary | ICD-10-CM | POA: Diagnosis not present

## 2017-08-01 DIAGNOSIS — H35452 Secondary pigmentary degeneration, left eye: Secondary | ICD-10-CM | POA: Diagnosis not present

## 2017-08-01 DIAGNOSIS — E113511 Type 2 diabetes mellitus with proliferative diabetic retinopathy with macular edema, right eye: Secondary | ICD-10-CM | POA: Diagnosis not present

## 2017-08-08 ENCOUNTER — Other Ambulatory Visit: Payer: Self-pay | Admitting: Internal Medicine

## 2017-08-08 DIAGNOSIS — I10 Essential (primary) hypertension: Secondary | ICD-10-CM

## 2017-08-17 MED FILL — TOUJEO SOLOSTAR 300 UNITS/M: 300 | 37 days supply | Qty: 5 | Fill #1

## 2017-08-19 ENCOUNTER — Encounter: Payer: Self-pay | Admitting: Family Medicine

## 2017-08-19 ENCOUNTER — Ambulatory Visit: Payer: Medicare HMO | Attending: Family Medicine | Admitting: Family Medicine

## 2017-08-19 VITALS — BP 122/74 | HR 79 | Temp 98.1°F | Ht 68.0 in | Wt 236.2 lb

## 2017-08-19 DIAGNOSIS — E1141 Type 2 diabetes mellitus with diabetic mononeuropathy: Secondary | ICD-10-CM | POA: Diagnosis not present

## 2017-08-19 DIAGNOSIS — E1165 Type 2 diabetes mellitus with hyperglycemia: Secondary | ICD-10-CM | POA: Diagnosis not present

## 2017-08-19 DIAGNOSIS — E11319 Type 2 diabetes mellitus with unspecified diabetic retinopathy without macular edema: Secondary | ICD-10-CM | POA: Insufficient documentation

## 2017-08-19 DIAGNOSIS — Z79899 Other long term (current) drug therapy: Secondary | ICD-10-CM | POA: Diagnosis not present

## 2017-08-19 DIAGNOSIS — Z881 Allergy status to other antibiotic agents status: Secondary | ICD-10-CM | POA: Diagnosis not present

## 2017-08-19 DIAGNOSIS — I2510 Atherosclerotic heart disease of native coronary artery without angina pectoris: Secondary | ICD-10-CM | POA: Diagnosis not present

## 2017-08-19 DIAGNOSIS — L409 Psoriasis, unspecified: Secondary | ICD-10-CM

## 2017-08-19 DIAGNOSIS — F329 Major depressive disorder, single episode, unspecified: Secondary | ICD-10-CM | POA: Diagnosis not present

## 2017-08-19 DIAGNOSIS — Z7982 Long term (current) use of aspirin: Secondary | ICD-10-CM | POA: Insufficient documentation

## 2017-08-19 DIAGNOSIS — J988 Other specified respiratory disorders: Secondary | ICD-10-CM

## 2017-08-19 DIAGNOSIS — E1142 Type 2 diabetes mellitus with diabetic polyneuropathy: Secondary | ICD-10-CM | POA: Insufficient documentation

## 2017-08-19 DIAGNOSIS — J302 Other seasonal allergic rhinitis: Secondary | ICD-10-CM | POA: Insufficient documentation

## 2017-08-19 DIAGNOSIS — I2583 Coronary atherosclerosis due to lipid rich plaque: Secondary | ICD-10-CM | POA: Diagnosis not present

## 2017-08-19 DIAGNOSIS — E7800 Pure hypercholesterolemia, unspecified: Secondary | ICD-10-CM | POA: Diagnosis not present

## 2017-08-19 DIAGNOSIS — Z951 Presence of aortocoronary bypass graft: Secondary | ICD-10-CM | POA: Diagnosis not present

## 2017-08-19 DIAGNOSIS — Z794 Long term (current) use of insulin: Secondary | ICD-10-CM | POA: Diagnosis not present

## 2017-08-19 DIAGNOSIS — I10 Essential (primary) hypertension: Secondary | ICD-10-CM

## 2017-08-19 LAB — POCT GLYCOSYLATED HEMOGLOBIN (HGB A1C): Hemoglobin A1C: 8.1

## 2017-08-19 LAB — GLUCOSE, POCT (MANUAL RESULT ENTRY): POC Glucose: 217 mg/dl — AB (ref 70–99)

## 2017-08-19 MED ORDER — CETIRIZINE HCL 10 MG PO TABS: 10.0000 mg | ORAL_TABLET | Freq: Every day | ORAL | 2 refills | Status: DC

## 2017-08-19 MED ORDER — METOPROLOL TARTRATE 50 MG PO TABS: ORAL_TABLET | ORAL | 1 refills | Status: DC

## 2017-08-19 MED ORDER — LISINOPRIL 10 MG PO TABS: 10.0000 mg | ORAL_TABLET | Freq: Every day | ORAL | 1 refills | Status: DC

## 2017-08-19 MED ORDER — INSULIN GLARGINE 300 UNIT/ML ~~LOC~~ SOPN: 40.0000 [IU] | PEN_INJECTOR | Freq: Every day | SUBCUTANEOUS | 6 refills | Status: DC

## 2017-08-19 MED ORDER — HYDROCHLOROTHIAZIDE 12.5 MG PO CAPS: 12.5000 mg | ORAL_CAPSULE | Freq: Every day | ORAL | 1 refills | Status: DC

## 2017-08-19 MED ORDER — ATORVASTATIN CALCIUM 10 MG PO TABS: 10.0000 mg | ORAL_TABLET | Freq: Every day | ORAL | 1 refills | Status: DC

## 2017-08-19 MED ORDER — CLOBETASOL PROPIONATE 0.05 % EX CREA: 1.0000 "application " | TOPICAL_CREAM | Freq: Two times a day (BID) | CUTANEOUS | 3 refills | Status: DC

## 2017-08-19 MED ORDER — PANTOPRAZOLE SODIUM 40 MG PO TBEC
40.0000 mg | DELAYED_RELEASE_TABLET | Freq: Every day | ORAL | 1 refills | Status: DC
Start: 2017-08-19 — End: 2018-01-31

## 2017-08-19 MED ORDER — METFORMIN HCL ER 500 MG PO TB24: 1000.0000 mg | ORAL_TABLET | Freq: Two times a day (BID) | ORAL | 1 refills | Status: DC

## 2017-08-19 MED ORDER — GABAPENTIN 300 MG PO CAPS: ORAL_CAPSULE | ORAL | 3 refills | Status: DC

## 2017-08-19 NOTE — Patient Instructions (Signed)
Diabetes Mellitus and Nutrition When you have diabetes (diabetes mellitus), it is very important to have healthy eating habits because your blood sugar (glucose) levels are greatly affected by what you eat and drink. Eating healthy foods in the appropriate amounts, at about the same times every day, can help you:  Control your blood glucose.  Lower your risk of heart disease.  Improve your blood pressure.  Reach or maintain a healthy weight.  Every person with diabetes is different, and each person has different needs for a meal plan. Your health care provider may recommend that you work with a diet and nutrition specialist (dietitian) to make a meal plan that is best for you. Your meal plan may vary depending on factors such as:  The calories you need.  The medicines you take.  Your weight.  Your blood glucose, blood pressure, and cholesterol levels.  Your activity level.  Other health conditions you have, such as heart or kidney disease.  How do carbohydrates affect me? Carbohydrates affect your blood glucose level more than any other type of food. Eating carbohydrates naturally increases the amount of glucose in your blood. Carbohydrate counting is a method for keeping track of how many carbohydrates you eat. Counting carbohydrates is important to keep your blood glucose at a healthy level, especially if you use insulin or take certain oral diabetes medicines. It is important to know how many carbohydrates you can safely have in each meal. This is different for every person. Your dietitian can help you calculate how many carbohydrates you should have at each meal and for snack. Foods that contain carbohydrates include:  Bread, cereal, rice, pasta, and crackers.  Potatoes and corn.  Peas, beans, and lentils.  Milk and yogurt.  Fruit and juice.  Desserts, such as cakes, cookies, ice cream, and candy.  How does alcohol affect me? Alcohol can cause a sudden decrease in blood  glucose (hypoglycemia), especially if you use insulin or take certain oral diabetes medicines. Hypoglycemia can be a life-threatening condition. Symptoms of hypoglycemia (sleepiness, dizziness, and confusion) are similar to symptoms of having too much alcohol. If your health care provider says that alcohol is safe for you, follow these guidelines:  Limit alcohol intake to no more than 1 drink per day for nonpregnant women and 2 drinks per day for men. One drink equals 12 oz of beer, 5 oz of wine, or 1 oz of hard liquor.  Do not drink on an empty stomach.  Keep yourself hydrated with water, diet soda, or unsweetened iced tea.  Keep in mind that regular soda, juice, and other mixers may contain a lot of sugar and must be counted as carbohydrates.  What are tips for following this plan? Reading food labels  Start by checking the serving size on the label. The amount of calories, carbohydrates, fats, and other nutrients listed on the label are based on one serving of the food. Many foods contain more than one serving per package.  Check the total grams (g) of carbohydrates in one serving. You can calculate the number of servings of carbohydrates in one serving by dividing the total carbohydrates by 15. For example, if a food has 30 g of total carbohydrates, it would be equal to 2 servings of carbohydrates.  Check the number of grams (g) of saturated and trans fats in one serving. Choose foods that have low or no amount of these fats.  Check the number of milligrams (mg) of sodium in one serving. Most people   should limit total sodium intake to less than 2,300 mg per day.  Always check the nutrition information of foods labeled as "low-fat" or "nonfat". These foods may be higher in added sugar or refined carbohydrates and should be avoided.  Talk to your dietitian to identify your daily goals for nutrients listed on the label. Shopping  Avoid buying canned, premade, or processed foods. These  foods tend to be high in fat, sodium, and added sugar.  Shop around the outside edge of the grocery store. This includes fresh fruits and vegetables, bulk grains, fresh meats, and fresh dairy. Cooking  Use low-heat cooking methods, such as baking, instead of high-heat cooking methods like deep frying.  Cook using healthy oils, such as olive, canola, or sunflower oil.  Avoid cooking with butter, cream, or high-fat meats. Meal planning  Eat meals and snacks regularly, preferably at the same times every day. Avoid going long periods of time without eating.  Eat foods high in fiber, such as fresh fruits, vegetables, beans, and whole grains. Talk to your dietitian about how many servings of carbohydrates you can eat at each meal.  Eat 4-6 ounces of lean protein each day, such as lean meat, chicken, fish, eggs, or tofu. 1 ounce is equal to 1 ounce of meat, chicken, or fish, 1 egg, or 1/4 cup of tofu.  Eat some foods each day that contain healthy fats, such as avocado, nuts, seeds, and fish. Lifestyle   Check your blood glucose regularly.  Exercise at least 30 minutes 5 or more days each week, or as told by your health care provider.  Take medicines as told by your health care provider.  Do not use any products that contain nicotine or tobacco, such as cigarettes and e-cigarettes. If you need help quitting, ask your health care provider.  Work with a counselor or diabetes educator to identify strategies to manage stress and any emotional and social challenges. What are some questions to ask my health care provider?  Do I need to meet with a diabetes educator?  Do I need to meet with a dietitian?  What number can I call if I have questions?  When are the best times to check my blood glucose? Where to find more information:  American Diabetes Association: diabetes.org/food-and-fitness/food  Academy of Nutrition and Dietetics:  www.eatright.org/resources/health/diseases-and-conditions/diabetes  National Institute of Diabetes and Digestive and Kidney Diseases (NIH): www.niddk.nih.gov/health-information/diabetes/overview/diet-eating-physical-activity Summary  A healthy meal plan will help you control your blood glucose and maintain a healthy lifestyle.  Working with a diet and nutrition specialist (dietitian) can help you make a meal plan that is best for you.  Keep in mind that carbohydrates and alcohol have immediate effects on your blood glucose levels. It is important to count carbohydrates and to use alcohol carefully. This information is not intended to replace advice given to you by your health care provider. Make sure you discuss any questions you have with your health care provider. Document Released: 12/31/2004 Document Revised: 05/10/2016 Document Reviewed: 05/10/2016 Elsevier Interactive Patient Education  2018 [employer].  

## 2017-08-19 NOTE — Progress Notes (Signed)
Insulin refill goes to onsite pharmacy all other medication goes to [facility]

## 2017-08-19 NOTE — Progress Notes (Signed)
Subjective:  Patient ID: Deborah Coffey, female    DOB: 02-26-1956  Age: 62 y.o. MRN: [id number]  CC: Diabetes and Cough   HPI Deborah Coffey is a 62 year female with a history of Type 2 DM (A1c 8.1), Diabetic neuropathy,Hypertension, CABG (s/p CABG) here to establish care. She complains of coughing up whitish phlegm, ears popping, nasal congestion which started with the onset of the pollen. She denies sinus pain or pressure. With regards to her Diabetes mellitus she has been compliant with her medications with her A1c trending up to 8.1 from 7.8 previously. She also has diabetic retinopathy and receives eye injections from Ophthalmology. Her neuropathy is controlled on Gabapentin. She denies chest pain or dyspnea and was last seen by Cardiology in 03/2017.   Echo 09/03/16: Study Conclusions  - Left ventricle: The cavity size was normal. Wall thickness was   normal. Systolic function was normal. The estimated ejection   fraction was in the range of 55% to 60%. Wall motion was normal;   there were no regional wall motion abnormalities. Doppler   parameters are consistent with abnormal left ventricular   relaxation (grade 1 diastolic dysfunction). - Pulmonary arteries: Systolic pressure was moderately increased.   PA peak pressure: 50 mm Hg (S).  Impressions:  - Normal LV systolic function; mild diastolic dysfunction; mild TR;   moderately elevated pulmonary pressure.  Past Medical History:  Diagnosis Date  . Allergy   . Anemia   . CAD in native artery 02/2004   Mid LAD lesoin just after Large D1 --> CABG x 1 LIMA-LAD  . CAP (community acquired pneumonia) 08/07/2014  . Chronic bronchitis ([place])    "I think I get it q yr" (08/07/2014)  . Depression   . High cholesterol   . History of blood transfusion 2005   "related to [facility]"   . Hypertension   . 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 competitive flow  . Type II  diabetes mellitus ([place])     Past Surgical History:  Procedure Laterality Date  . CARDIAC CATHETERIZATION  2010   Previous LAD 95% lesion - now ~30-40%; patent LIMA with competitive flow  . CESAREAN SECTION  1977; 1989  . CHOLECYSTECTOMY  ~ 2012  . CORONARY ARTERY BYPASS GRAFT  02/2004   "CABG X1" (03/22/2013); LIMA-LAD  . TUBAL LIGATION  1989    Allergies  Allergen Reactions  . Ciprofloxacin Rash     Outpatient Medications Prior to Visit  Medication Sig Dispense Refill  . ACCU-CHEK FASTCLIX LANCETS MISC Use as directed to check blood glucose 3 times daily. E11.9 100 each 5  . acetaminophen (TYLENOL) 500 MG tablet Take 1,000 mg by mouth every 6 (six) hours as needed for headache (pain).     . Alcohol Swabs (B-D SINGLE USE SWABS REGULAR) PADS USE AS DIRECTED 100 each 0  . aspirin EC 81 MG tablet Take 81 mg by mouth daily.    . Blood Glucose Monitoring Suppl (ACCU-CHEK NANO SMARTVIEW) w/Device KIT Use as directed to check blood glucose 3 times daily. E11.9 1 kit 0  . furosemide (LASIX) 20 MG tablet TAKE 1 TABLET EVERY DAY 90 tablet 0  . glucose blood (ACCU-CHEK SMARTVIEW) test strip Use as instructed to test blood glucose 3 times daily. E11.9 100 each 12  . Multiple Vitamin (MULTIVITAMIN WITH MINERALS) TABS tablet Take 1 tablet by mouth daily.    . nitroGLYCERIN (NITROSTAT) 0.4 MG SL  tablet Place 1 tablet (0.4 mg total) under the tongue every 5 (five) minutes as needed for chest pain. 25 tablet 3  . Vitamin D, Ergocalciferol, (DRISDOL) 50000 units CAPS capsule Take 1 capsule by mouth once a week.    Marland Kitchen atorvastatin (LIPITOR) 10 MG tablet TAKE 1 TABLET EVERY DAY 90 tablet 0  . clobetasol cream (TEMOVATE) 3.35 % Apply 1 application topically 2 (two) times daily. 60 g 3  . gabapentin (NEURONTIN) 300 MG capsule TAKE 1 CAPSULE THREE TIMES DAILY 90 capsule 0  . hydrochlorothiazide (MICROZIDE) 12.5 MG capsule TAKE 1 CAPSULE (12.5 MG TOTAL) BY MOUTH DAILY. 90 capsule 6  . lisinopril  (PRINIVIL,ZESTRIL) 10 MG tablet TAKE 1 TABLET EVERY DAY 90 tablet 0  . metFORMIN (GLUCOPHAGE-XR) 500 MG 24 hr tablet TAKE 2 TABLETS BY MOUTH 2 TIMES DAILY WITH A MEAL 120 tablet 0  . metoprolol tartrate (LOPRESSOR) 50 MG tablet TAKE 1 TABLET TWICE DAILY (MUST HAVE OFFICE VISIT FOR REFILLS) 60 tablet 0  . pantoprazole (PROTONIX) 40 MG tablet TAKE 1 TABLET EVERY DAY 90 tablet 0  . TOUJEO SOLOSTAR 300 UNIT/ML SOPN INJECT 35 UNITS INTO THE SKIN DAILY WITH BREAKFAST. 4.5 mL 2   No facility-administered medications prior to visit.     ROS Review of Systems  Constitutional: Negative for activity change, appetite change and fatigue.  HENT: Positive for congestion. Negative for sinus pressure and sore throat.   Eyes: Negative for visual disturbance.  Respiratory: Positive for cough. Negative for chest tightness, shortness of breath and wheezing.   Cardiovascular: Negative for chest pain and palpitations.  Gastrointestinal: Negative for abdominal distention, abdominal pain and constipation.  Endocrine: Negative for polydipsia.  Genitourinary: Negative for dysuria and frequency.  Musculoskeletal: Negative for arthralgias and back pain.  Skin: Negative for rash.  Neurological: Negative for tremors, light-headedness and numbness.  Hematological: Does not bruise/bleed easily.  Psychiatric/Behavioral: Negative for agitation and behavioral problems.    Objective:  BP 122/74   Pulse 79   Temp 98.1 F (36.7 C) (Oral)   Ht 5' 8" (1.727 m)   Wt 236 lb 3.2 oz (107.1 kg)   LMP  (LMP Unknown)   SpO2 99%   BMI 35.91 kg/m   BP/Weight 08/19/2017 05/04/2017 45/09/2561  Systolic BP 893 734 287  Diastolic BP 74 86 64  Wt. (Lbs) 236.2 231.6 231.4  BMI 35.91 35.21 38.51      Physical Exam  Constitutional: She is oriented to person, place, and time. She appears well-developed and well-nourished.  Cardiovascular: Normal rate, normal heart sounds and intact distal pulses.  No murmur  heard. Pulmonary/Chest: Effort normal and breath sounds normal. She has no wheezes. She has no rales. She exhibits no tenderness.  Abdominal: Soft. Bowel sounds are normal. She exhibits no distension and no mass. There is no tenderness.  Musculoskeletal: Normal range of motion.  Neurological: She is alert and oriented to person, place, and time.  Skin: Skin is warm and dry.  Psychiatric: She has a normal mood and affect.   CMP Latest Ref Rng & Units 09/17/2016 09/02/2016 09/02/2016  Glucose 65 - 99 mg/dL 264(H) 321(H) 332(H)  BUN 6 - 20 mg/dL 16 24(H) 22(H)  Creatinine 0.44 - 1.00 mg/dL 1.07(H) 1.40(H) 1.39(H)  Sodium 135 - 145 mmol/L 138 137 134(L)  Potassium 3.5 - 5.1 mmol/L 4.3 4.9 5.0  Chloride 101 - 111 mmol/L 106 103 103  CO2 22 - 32 mmol/L 24 - 25  Calcium 8.9 - 10.3 mg/dL 9.1 -  9.2  Total Protein 6.5 - 8.1 g/dL - - 6.6  Total Bilirubin 0.3 - 1.2 mg/dL - - 0.4  Alkaline Phos 38 - 126 U/L - - 89  AST 15 - 41 U/L - - 18  ALT 14 - 54 U/L - - 14    Lipid Panel     Component Value Date/Time   CHOL 137 09/02/2016 0533   CHOL 167 07/14/2016 1028   TRIG 67 09/02/2016 0533   HDL 61 09/02/2016 0533   HDL 71 07/14/2016 1028   CHOLHDL 2.2 09/02/2016 0533   VLDL 13 09/02/2016 0533   LDLCALC 63 09/02/2016 0533   LDLCALC 77 07/14/2016 1028    Lab Results  Component Value Date   HGBA1C 8.1 08/19/2017     Assessment & Plan:   1. Uncontrolled type 2 diabetes mellitus with hyperglycemia (HCC) Uncontrolled with A1c of 8.1 which is up from 7.8 previously Increased dose of [place] Diabetic diet, lifestyle modifications Up to date on eye exam - POCT glucose (manual entry) - POCT glycosylated hemoglobin (Hb A1C) - Insulin Glargine (TOUJEO SOLOSTAR) 300 UNIT/ML SOPN; Inject 40 Units into the skin daily.  Dispense: 10 mL; Refill: 6 - CMP14+EGFR; Future - Lipid panel; Future - Microalbumin/Creatinine Ratio, Urine; Future - gabapentin (NEURONTIN) 300 MG capsule; TAKE 1 CAPSULE THREE  TIMES DAILY  Dispense: 90 capsule; Refill: 3 - atorvastatin (LIPITOR) 10 MG tablet; Take 1 tablet (10 mg total) by mouth daily.  Dispense: 90 tablet; Refill: 1 - metFORMIN (GLUCOPHAGE-XR) 500 MG 24 hr tablet; Take 2 tablets (1,000 mg total) by mouth 2 (two) times daily.  Dispense: 360 tablet; Refill: 1  2. Psoriasis of scalp - clobetasol cream (TEMOVATE) 0.05 %; Apply 1 application topically 2 (two) times daily.  Dispense: 60 g; Refill: 3  3. Diabetic mononeuropathy associated with type 2 diabetes mellitus (HCC) Stable - gabapentin (NEURONTIN) 300 MG capsule; TAKE 1 CAPSULE THREE TIMES DAILY  Dispense: 90 capsule; Refill: 3  4. Congestion of upper airway Seasonal allergy symptoms - cetirizine (ZYRTEC) 10 MG tablet; Take 1 tablet (10 mg total) by mouth daily.  Dispense: 30 tablet; Refill: 2  5. Essential hypertension Controlled Low sodium diet - hydrochlorothiazide (MICROZIDE) 12.5 MG capsule; Take 1 capsule (12.5 mg total) by mouth daily.  Dispense: 90 capsule; Refill: 1 - lisinopril (PRINIVIL,ZESTRIL) 10 MG tablet; Take 1 tablet (10 mg total) by mouth daily.  Dispense: 90 tablet; Refill: 1 - metoprolol tartrate (LOPRESSOR) 50 MG tablet; TAKE 1 TABLET TWICE DAILY  Dispense: 180 tablet; Refill: 1 - pantoprazole (PROTONIX) 40 MG tablet; Take 1 tablet (40 mg total) by mouth daily.  Dispense: 90 tablet; Refill: 1  6. Coronary artery disease due to lipid rich plaque S/p CABG Stable Risk factor modification   Meds ordered this encounter  Medications  . Insulin Glargine (TOUJEO SOLOSTAR) 300 UNIT/ML SOPN    Sig: Inject 40 Units into the skin daily.    Dispense:  10 mL    Refill:  6    Discontinue previous dose  . clobetasol cream (TEMOVATE) 0.05 %    Sig: Apply 1 application topically 2 (two) times daily.    Dispense:  60 g    Refill:  3  . gabapentin (NEURONTIN) 300 MG capsule    Sig: TAKE 1 CAPSULE THREE TIMES DAILY    Dispense:  90 capsule    Refill:  3  . cetirizine (ZYRTEC)  10 MG tablet    Sig: Take 1 tablet (10 mg total) by   mouth daily.    Dispense:  30 tablet    Refill:  2  . atorvastatin (LIPITOR) 10 MG tablet    Sig: Take 1 tablet (10 mg total) by mouth daily.    Dispense:  90 tablet    Refill:  1  . hydrochlorothiazide (MICROZIDE) 12.5 MG capsule    Sig: Take 1 capsule (12.5 mg total) by mouth daily.    Dispense:  90 capsule    Refill:  1  . lisinopril (PRINIVIL,ZESTRIL) 10 MG tablet    Sig: Take 1 tablet (10 mg total) by mouth daily.    Dispense:  90 tablet    Refill:  1    Must have office visit for refills  . metFORMIN (GLUCOPHAGE-XR) 500 MG 24 hr tablet    Sig: Take 2 tablets (1,000 mg total) by mouth 2 (two) times daily.    Dispense:  360 tablet    Refill:  1  . metoprolol tartrate (LOPRESSOR) 50 MG tablet    Sig: TAKE 1 TABLET TWICE DAILY    Dispense:  180 tablet    Refill:  1  . pantoprazole (PROTONIX) 40 MG tablet    Sig: Take 1 tablet (40 mg total) by mouth daily.    Dispense:  90 tablet    Refill:  1    Follow-up: Return in about 3 months (around 11/19/2017) for follow up of chronic medical conditions.   Enobong Newlin MD   

## 2017-08-26 ENCOUNTER — Other Ambulatory Visit: Payer: Self-pay

## 2017-09-02 ENCOUNTER — Other Ambulatory Visit: Payer: Self-pay

## 2017-09-22 ENCOUNTER — Ambulatory Visit (INDEPENDENT_AMBULATORY_CARE_PROVIDER_SITE_OTHER): Payer: Medicare HMO | Admitting: Cardiology

## 2017-09-22 ENCOUNTER — Encounter: Payer: Self-pay | Admitting: Cardiology

## 2017-09-22 VITALS — BP 146/72 | HR 84 | Ht 66.0 in | Wt 242.8 lb

## 2017-09-22 DIAGNOSIS — I2510 Atherosclerotic heart disease of native coronary artery without angina pectoris: Secondary | ICD-10-CM

## 2017-09-22 DIAGNOSIS — I10 Essential (primary) hypertension: Secondary | ICD-10-CM | POA: Diagnosis not present

## 2017-09-22 DIAGNOSIS — Z720 Tobacco use: Secondary | ICD-10-CM | POA: Diagnosis not present

## 2017-09-22 NOTE — Progress Notes (Signed)
Cardiology Office Note:    Date:  09/22/2017   ID:  [name], DOB 1956-04-14, MRN [id number]  PCP:  Charlott Rakes, MD  Cardiologist:  Candee Furbish, MD   Referring MD: Tresa Garter, MD     History of Present Illness:    Deborah Coffey is a 62 y.o. female here for the evaluation of chest pain at the request of Dr. Cristie Hem.It has been greater than 3 years since prior office visit.  Was in the ER on 09/17/16 with mid left chest pain, squeezing, no radiation. Cough and lower extremity edema noted.   Was in hospital 2 weeks prior to that for 2 days. There was some concern about facial droop. She is seeing neurology soon.  An echocardiogram was performed that showed me pressures of 50. Grade 1 diastolic dysfunction. BNP was mildly elevated at 110. CT scan of chest showed no dissection, no PE.  She sometimes has symptoms of left arm discomfort especially when lifting heavy bags like grocery bags for instance or when raising her arm to brush her hair. She thinks it may be a tendon. She was concerned because she had left arm pain prior to her bypass surgery in 2005.  Troponins were all normal.  No lower extremity edema currently. Continues to smoke.  09/22/2017 -overall has been doing quite well.  Has been battling with postnasal drip.  Fatigue.  No further chest discomfort.  No significant shortness of breath.  She still struggling with weight loss.  Still smoking.  Compliant with her medicines.   Past Medical History:  Diagnosis Date  . Allergy   . Anemia   . CAD in native artery 02/2004   Mid LAD lesoin just after Large D1 --> CABG x 1 LIMA-LAD  . CAP (community acquired pneumonia) 08/07/2014  . Chronic bronchitis ([place])    "I think I get it q yr" (08/07/2014)  . Depression   . High cholesterol   . History of blood transfusion 2005   "related to [facility]"   . Hypertension   . 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  competitive flow  . Type II diabetes mellitus ([place])     Past Surgical History:  Procedure Laterality Date  . CARDIAC CATHETERIZATION  2010   Previous LAD 95% lesion - now ~30-40%; patent LIMA with competitive flow  . CESAREAN SECTION  1977; 1989  . CHOLECYSTECTOMY  ~ 2012  . CORONARY ARTERY BYPASS GRAFT  02/2004   "CABG X1" (03/22/2013); LIMA-LAD  . TUBAL LIGATION  1989    Current Medications: Current Meds  Medication Sig  . ACCU-CHEK FASTCLIX LANCETS MISC Use as directed to check blood glucose 3 times daily. E11.9  . acetaminophen (TYLENOL) 500 MG tablet Take 1,000 mg by mouth every 6 (six) hours as needed for headache (pain).   . Alcohol Swabs (B-D SINGLE USE SWABS REGULAR) PADS USE AS DIRECTED  . aspirin EC 81 MG tablet Take 81 mg by mouth daily.  Marland Kitchen atorvastatin (LIPITOR) 10 MG tablet Take 1 tablet (10 mg total) by mouth daily.  . Blood Glucose Monitoring Suppl (ACCU-CHEK NANO SMARTVIEW) w/Device KIT Use as directed to check blood glucose 3 times daily. E11.9  . cetirizine (ZYRTEC) 10 MG tablet Take 1 tablet (10 mg total) by mouth daily.  . clobetasol cream (TEMOVATE) 7.35 % Apply 1 application topically 2 (two) times daily.  . furosemide (LASIX) 20 MG tablet TAKE 1 TABLET EVERY DAY  .  gabapentin (NEURONTIN) 300 MG capsule TAKE 1 CAPSULE THREE TIMES DAILY  . glucose blood (ACCU-CHEK SMARTVIEW) test strip Use as instructed to test blood glucose 3 times daily. E11.9  . hydrochlorothiazide (MICROZIDE) 12.5 MG capsule Take 1 capsule (12.5 mg total) by mouth daily.  . Insulin Glargine (TOUJEO SOLOSTAR) 300 UNIT/ML SOPN Inject 40 Units into the skin daily.  Marland Kitchen lisinopril (PRINIVIL,ZESTRIL) 10 MG tablet Take 1 tablet (10 mg total) by mouth daily.  . metFORMIN (GLUCOPHAGE-XR) 500 MG 24 hr tablet Take 2 tablets (1,000 mg total) by mouth 2 (two) times daily.  . metoprolol tartrate (LOPRESSOR) 50 MG tablet TAKE 1 TABLET TWICE DAILY  . Multiple Vitamin (MULTIVITAMIN WITH MINERALS) TABS tablet  Take 1 tablet by mouth daily.  . nitroGLYCERIN (NITROSTAT) 0.4 MG SL tablet Place 1 tablet (0.4 mg total) under the tongue every 5 (five) minutes as needed for chest pain.  . pantoprazole (PROTONIX) 40 MG tablet Take 1 tablet (40 mg total) by mouth daily.  . Vitamin D, Ergocalciferol, (DRISDOL) 50000 units CAPS capsule Take 1 capsule by mouth once a week.     Allergies:   Ciprofloxacin   Social History   Socioeconomic History  . Marital status: Divorced    Spouse name: Not on file  . Number of children: Not on file  . Years of education: Not on file  . Highest education level: Not on file  Occupational History  . Not on file  Social Needs  . Financial resource strain: Not on file  . Food insecurity:    Worry: Not on file    Inability: Not on file  . Transportation needs:    Medical: Not on file    Non-medical: Not on file  Tobacco Use  . Smoking status: Current Some Day Smoker    Packs/day: 0.50    Years: 42.00    Pack years: 21.00    Types: Cigarettes  . Smokeless tobacco: Never Used  Substance and Sexual Activity  . Alcohol use: Yes    Alcohol/week: 0.6 - 1.2 oz    Types: 1 - 2 Glasses of wine per week    Comment: 08/07/2014 "glass of wine maybe 2 times/yr"  . Drug use: No    Types: Marijuana    Comment: smoked pot years ago but not currently  . Sexual activity: Not Currently  Lifestyle  . Physical activity:    Days per week: Not on file    Minutes per session: Not on file  . Stress: Not on file  Relationships  . Social connections:    Talks on phone: Not on file    Gets together: Not on file    Attends religious service: Not on file    Active member of club or organization: Not on file    Attends meetings of clubs or organizations: Not on file    Relationship status: Not on file  Other Topics Concern  . Not on file  Social History Narrative  . Not on file     Family History: The patient's family history includes Cancer in her mother; Diabetes in her  brother, brother, father, mother, and sister; Heart attack in her brother; Heart disease in her mother and sister; Heart failure in her brother and sister; Hepatitis C in her sister; Hypertension in her daughter; Lupus in her sister. There is no history of Colon cancer. ROS:   Please see the history of present illness.   No orthopnea, no PND, no syncope, no bleeding  All  other ROS neg  EKGs/Labs/Other Studies Reviewed:    The following studies were reviewed today:  ECHO 09/03/16 - Left ventricle: The cavity size was normal. Wall thickness was   normal. Systolic function was normal. The estimated ejection   fraction was in the range of 55% to 60%. Wall motion was normal;   there were no regional wall motion abnormalities. Doppler   parameters are consistent with abnormal left ventricular   relaxation (grade 1 diastolic dysfunction). - Pulmonary arteries: Systolic pressure was moderately increased.   PA peak pressure: 50 mm Hg (S).  Impressions:  - Normal LV systolic function; mild diastolic dysfunction; mild TR;   moderately elevated pulmonary pressure.  Nuclear stress test in 03/23/13  - No ischemia.  EKG:  09/22/17 -sinus rhythm 84 with premature atrial complex.  09/17/16-sinus rhythm 73 with T-wave inversion subtle in V1 and V2. Personally viewed, no specific changes from prior.  Recent Labs: 03/22/2017: Hemoglobin 10.3; Platelets 315   Recent Lipid Panel    Component Value Date/Time   CHOL 137 09/02/2016 0533   CHOL 167 07/14/2016 1028   TRIG 67 09/02/2016 0533   HDL 61 09/02/2016 0533   HDL 71 07/14/2016 1028   CHOLHDL 2.2 09/02/2016 0533   VLDL 13 09/02/2016 0533   LDLCALC 63 09/02/2016 0533   LDLCALC 77 07/14/2016 1028    Physical Exam:    VS:  BP (!) 146/72   Pulse 84   Ht _0  (1.676 m)   Wt 242 lb 12.8 oz (110.1 kg)   LMP  (LMP Unknown)   BMI 39.19 kg/m     Wt Readings from Last 3 Encounters:  09/22/17 242 lb 12.8 oz (110.1 kg)  08/19/17 236 lb 3.2 oz  (107.1 kg)  05/04/17 231 lb 9.6 oz (105.1 kg)     GEN: Well nourished, well developed, in no acute distress, obese.  HEENT: normal  Neck: no JVD, carotid bruits, or masses Cardiac: RRR; soft systolic murmur, no rubs, or gallops,no significant edema  Respiratory:  clear to auscultation bilaterally, normal work of breathing GI: soft, nontender, nondistended, + BS MS: no deformity or atrophy  Skin: warm and dry, no rash Neuro:  Alert and Oriented x 3, Strength and sensation are intact Psych: euthymic mood, full affect   ASSESSMENT:    1. Coronary artery disease involving native coronary artery of native heart without angina pectoris   2. Essential hypertension   3. Morbid obesity ([place])   4. Tobacco use    PLAN:    In order of problems listed above:  Coronary artery disease status post CABG 1 in 2005  - LIMA to LAD  - Echocardiogram shows normal wall motion.   -Very well, no chest pain.   Secondary pulmonary hypertension  - Smoking, weight discussed both.  Decrease carbohydrates.  Tobacco use  - Continue to encourage cessation.  This is of utmost importance for cardiovascular health.  Prior TIA  - Stable.   Diabetes with hypertension  - Medications reviewed. Per primary.  Grade 1 diastolic dysfunction  - This finding is to be expected with age as well as obesity.  - Continue with current medications.   Pulmonary hypertension, secondary -Continue to encourage smoking cessation.  Weight loss.   1 year follow-up.  Medication Adjustments/Labs and Tests Ordered: Current medicines are reviewed at length with the patient today.  Concerns regarding medicines are outlined above. Labs and tests ordered and medication changes are outlined in the patient instructions below:  Patient  Instructions  Medication Instructions:  The current medical regimen is effective;  continue present plan and medications.  Follow-Up: Follow up in 1 year with Dr. Marlou Porch.  You will receive a  letter in the mail 2 months before you are due.  Please call us when you receive this letter to schedule your follow up appointment.  If you need a refill on your cardiac medications before your next appointment, please call your pharmacy.  Thank you for choosing [facility]!!        Signed, Candee Furbish, MD  09/22/2017 11:03 AM    [place]

## 2017-09-22 NOTE — Patient Instructions (Signed)

## 2017-10-24 MED FILL — TOUJEO SOLOSTAR 300 UNITS/M: 300 | 33 days supply | Qty: 5 | Fill #0

## 2017-11-22 ENCOUNTER — Ambulatory Visit: Payer: Self-pay | Admitting: Family Medicine

## 2017-12-09 ENCOUNTER — Other Ambulatory Visit: Payer: Self-pay | Admitting: Family Medicine

## 2017-12-13 MED FILL — TOUJEO SOLOSTAR 300 UNITS/M: 300 | 33 days supply | Qty: 5 | Fill #1

## 2018-01-19 MED FILL — TOUJEO SOLOSTAR 300 UNITS/M: 300 | 33 days supply | Qty: 5 | Fill #2

## 2018-01-20 DIAGNOSIS — E113592 Type 2 diabetes mellitus with proliferative diabetic retinopathy without macular edema, left eye: Secondary | ICD-10-CM | POA: Diagnosis not present

## 2018-01-20 DIAGNOSIS — E113491 Type 2 diabetes mellitus with severe nonproliferative diabetic retinopathy without macular edema, right eye: Secondary | ICD-10-CM | POA: Diagnosis not present

## 2018-01-20 DIAGNOSIS — H2513 Age-related nuclear cataract, bilateral: Secondary | ICD-10-CM | POA: Diagnosis not present

## 2018-01-20 DIAGNOSIS — H40033 Anatomical narrow angle, bilateral: Secondary | ICD-10-CM | POA: Diagnosis not present

## 2018-01-27 ENCOUNTER — Telehealth: Payer: Self-pay | Admitting: Cardiology

## 2018-01-27 DIAGNOSIS — I2510 Atherosclerotic heart disease of native coronary artery without angina pectoris: Secondary | ICD-10-CM

## 2018-01-27 NOTE — Telephone Encounter (Signed)
Pt called to report that she has been experiencing chest pain across her chest since last Friday.. She also reports bilateral arm pain that occurs at rest and with movement. She says that she has had some ankle edema for the last 2 days. She denies sob, dizziness, headache... She says the pain in her chest is on and off at rest and dull in nature. Per Dr. Marlou Porch... Pt advised that she should go to the ER if the pain is continuing.. If not then we will go ahead and order a stress test for next week and she will get a call with a date and time.. But pt urged not to wait until the stress test if her symptoms worsen. Pt verbalized understanding and agrees.

## 2018-01-27 NOTE — Telephone Encounter (Signed)
New message  Pt c/o swelling: STAT is pt has developed SOB within 24 hours  1) How much weight have you gained and in what time span? Patient does not know  2) If swelling, where is the swelling located? Swelling is in both feet   3) Are you currently taking a fluid pill? Yes   4) Are you currently SOB? No   5) Do you have a log of your daily weights (if so, list)? No   6) Have you gained 3 pounds in a day or 5 pounds in a week?no   7) Have you traveled recently? No  Patient states that both her arms are hurting and across the top of her chest and patient feels weak and tired.

## 2018-01-31 ENCOUNTER — Encounter: Payer: Self-pay | Admitting: Family Medicine

## 2018-01-31 ENCOUNTER — Ambulatory Visit: Payer: Medicare HMO | Attending: Family Medicine | Admitting: Family Medicine

## 2018-01-31 VITALS — BP 152/77 | HR 93 | Temp 98.0°F | Ht 66.0 in | Wt 246.0 lb

## 2018-01-31 DIAGNOSIS — Z881 Allergy status to other antibiotic agents status: Secondary | ICD-10-CM | POA: Insufficient documentation

## 2018-01-31 DIAGNOSIS — E1165 Type 2 diabetes mellitus with hyperglycemia: Secondary | ICD-10-CM

## 2018-01-31 DIAGNOSIS — M7501 Adhesive capsulitis of right shoulder: Secondary | ICD-10-CM | POA: Diagnosis not present

## 2018-01-31 DIAGNOSIS — Z7982 Long term (current) use of aspirin: Secondary | ICD-10-CM | POA: Insufficient documentation

## 2018-01-31 DIAGNOSIS — I2510 Atherosclerotic heart disease of native coronary artery without angina pectoris: Secondary | ICD-10-CM | POA: Diagnosis not present

## 2018-01-31 DIAGNOSIS — I2583 Coronary atherosclerosis due to lipid rich plaque: Secondary | ICD-10-CM

## 2018-01-31 DIAGNOSIS — E11649 Type 2 diabetes mellitus with hypoglycemia without coma: Secondary | ICD-10-CM | POA: Diagnosis not present

## 2018-01-31 DIAGNOSIS — M7502 Adhesive capsulitis of left shoulder: Secondary | ICD-10-CM | POA: Diagnosis not present

## 2018-01-31 DIAGNOSIS — E11319 Type 2 diabetes mellitus with unspecified diabetic retinopathy without macular edema: Secondary | ICD-10-CM | POA: Diagnosis not present

## 2018-01-31 DIAGNOSIS — Z9851 Tubal ligation status: Secondary | ICD-10-CM | POA: Insufficient documentation

## 2018-01-31 DIAGNOSIS — Z9889 Other specified postprocedural states: Secondary | ICD-10-CM | POA: Insufficient documentation

## 2018-01-31 DIAGNOSIS — Z1159 Encounter for screening for other viral diseases: Secondary | ICD-10-CM | POA: Diagnosis not present

## 2018-01-31 DIAGNOSIS — Z951 Presence of aortocoronary bypass graft: Secondary | ICD-10-CM | POA: Insufficient documentation

## 2018-01-31 DIAGNOSIS — Z79899 Other long term (current) drug therapy: Secondary | ICD-10-CM | POA: Insufficient documentation

## 2018-01-31 DIAGNOSIS — I10 Essential (primary) hypertension: Secondary | ICD-10-CM | POA: Insufficient documentation

## 2018-01-31 DIAGNOSIS — E1141 Type 2 diabetes mellitus with diabetic mononeuropathy: Secondary | ICD-10-CM | POA: Insufficient documentation

## 2018-01-31 DIAGNOSIS — Z9049 Acquired absence of other specified parts of digestive tract: Secondary | ICD-10-CM | POA: Insufficient documentation

## 2018-01-31 DIAGNOSIS — J988 Other specified respiratory disorders: Secondary | ICD-10-CM | POA: Diagnosis not present

## 2018-01-31 LAB — POCT GLYCOSYLATED HEMOGLOBIN (HGB A1C): HEMOGLOBIN A1C: 7.8 % — AB (ref 4.0–5.6)

## 2018-01-31 LAB — GLUCOSE, POCT (MANUAL RESULT ENTRY): POC GLUCOSE: 211 mg/dL — AB (ref 70–99)

## 2018-01-31 MED ORDER — LISINOPRIL 10 MG PO TABS: 10.0000 mg | ORAL_TABLET | Freq: Every day | ORAL | 1 refills | Status: DC

## 2018-01-31 MED ORDER — FUROSEMIDE 20 MG PO TABS: 20.0000 mg | ORAL_TABLET | Freq: Every day | ORAL | 1 refills | Status: DC

## 2018-01-31 MED ORDER — CETIRIZINE HCL 10 MG PO TABS: 10.0000 mg | ORAL_TABLET | Freq: Every day | ORAL | 2 refills | Status: DC

## 2018-01-31 MED ORDER — PANTOPRAZOLE SODIUM 40 MG PO TBEC: 40.0000 mg | DELAYED_RELEASE_TABLET | Freq: Every day | ORAL | 1 refills | Status: DC

## 2018-01-31 MED ORDER — DICLOFENAC SODIUM 1 % TD GEL: 4.0000 g | Freq: Four times a day (QID) | TRANSDERMAL | 3 refills | Status: DC

## 2018-01-31 MED ORDER — ATORVASTATIN CALCIUM 10 MG PO TABS: 10.0000 mg | ORAL_TABLET | Freq: Every day | ORAL | 1 refills | Status: DC

## 2018-01-31 MED ORDER — INSULIN GLARGINE 300 UNIT/ML ~~LOC~~ SOPN: 43.0000 [IU] | PEN_INJECTOR | Freq: Every day | SUBCUTANEOUS | 6 refills | Status: DC

## 2018-01-31 MED ORDER — GABAPENTIN 300 MG PO CAPS: ORAL_CAPSULE | ORAL | 1 refills | Status: DC

## 2018-01-31 MED ORDER — METOPROLOL TARTRATE 50 MG PO TABS: ORAL_TABLET | ORAL | 1 refills | Status: DC

## 2018-01-31 MED ORDER — HYDROCHLOROTHIAZIDE 12.5 MG PO CAPS: 12.5000 mg | ORAL_CAPSULE | Freq: Every day | ORAL | 1 refills | Status: DC

## 2018-01-31 MED ORDER — PNEUMOCOCCAL VAC POLYVALENT 25 MCG/0.5ML IJ INJ: 0.5000 mL | INJECTION | Freq: Once | INTRAMUSCULAR | 0 refills | Status: AC

## 2018-01-31 MED ORDER — METFORMIN HCL ER 500 MG PO TB24: 1000.0000 mg | ORAL_TABLET | Freq: Two times a day (BID) | ORAL | 1 refills | Status: DC

## 2018-01-31 MED ORDER — FUROSEMIDE 20 MG PO TABS: 20.0000 mg | ORAL_TABLET | Freq: Every day | ORAL | 0 refills | Status: DC

## 2018-01-31 MED FILL — PNEUMOVAX 23 VIAL: 25 | 1 days supply | Qty: 1 | Fill #0

## 2018-01-31 NOTE — Progress Notes (Signed)
7.8!!!    :)

## 2018-01-31 NOTE — Progress Notes (Signed)
Subjective:  Patient ID: Deborah Coffey, female    DOB: 28-Sep-1955  Age: 62 y.o. MRN: [id number]  CC: Diabetes   HPI Deborah Coffey  is a 62 year female with a history of Type 2 DM (A1c 7.8), Diabetic neuropathy,Hypertension, CABG (s/p CABG) here for a follow-up visit Ever since the cold weather commenced she has noticed increased stiffness in both shoulders with difficulty raising her arms up completely.  This has been associated with pain which is not relieved by taking Tylenol and pain is described as moderate. She denies chest pains, shortness of breath but has had some lower extremity edema as she ran out of her Lasix.  With regards to her diabetes she denies hypoglycemia and has been compliant with Lantus.  Neuropathy has been controlled on gabapentin and for her diabetic retinopathy she sees [facility] and also a retina specialist. She is compliant with her antihypertensive and her statin and denies additional concerns at this time. With regards to healthcare maintenance she is due for Pap smear, mammogram.  Past Medical History:  Diagnosis Date  . Allergy   . Anemia   . CAD in native artery 02/2004   Mid LAD lesoin just after Large D1 --> CABG x 1 LIMA-LAD  . CAP (community acquired pneumonia) 08/07/2014  . Chronic bronchitis ([place])    "I think I get it q yr" (08/07/2014)  . Depression   . High cholesterol   . History of blood transfusion 2005   "related to OHS"   . Hypertension   . 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 competitive flow  . Type II diabetes mellitus ([place])     Past Surgical History:  Procedure Laterality Date  . CARDIAC CATHETERIZATION  2010   Previous LAD 95% lesion - now ~30-40%; patent LIMA with competitive flow  . CESAREAN SECTION  1977; 1989  . CHOLECYSTECTOMY  ~ 2012  . CORONARY ARTERY BYPASS GRAFT  02/2004   "CABG X1" (03/22/2013); LIMA-LAD  . TUBAL LIGATION  1989    Allergies    Allergen Reactions  . Ciprofloxacin Rash     Outpatient Medications Prior to Visit  Medication Sig Dispense Refill  . ACCU-CHEK FASTCLIX LANCETS MISC Use as directed to check blood glucose 3 times daily. E11.9 100 each 5  . acetaminophen (TYLENOL) 500 MG tablet Take 1,000 mg by mouth every 6 (six) hours as needed for headache (pain).     . Alcohol Swabs (B-D SINGLE USE SWABS REGULAR) PADS USE AS DIRECTED 100 each 0  . aspirin EC 81 MG tablet Take 81 mg by mouth daily.    . Blood Glucose Monitoring Suppl (ACCU-CHEK NANO SMARTVIEW) w/Device KIT Use as directed to check blood glucose 3 times daily. E11.9 1 kit 0  . clobetasol cream (TEMOVATE) 1.50 % Apply 1 application topically 2 (two) times daily. 60 g 3  . glucose blood (ACCU-CHEK SMARTVIEW) test strip Use as instructed to test blood glucose 3 times daily. E11.9 100 each 12  . Multiple Vitamin (MULTIVITAMIN WITH MINERALS) TABS tablet Take 1 tablet by mouth daily.    . nitroGLYCERIN (NITROSTAT) 0.4 MG SL tablet Place 1 tablet (0.4 mg total) under the tongue every 5 (five) minutes as needed for chest pain. 25 tablet 3  . Vitamin D, Ergocalciferol, (DRISDOL) 50000 units CAPS capsule Take 1 capsule by mouth once a week.    Marland Kitchen atorvastatin (LIPITOR) 10 MG tablet Take 1 tablet (10  mg total) by mouth daily. 90 tablet 1  . cetirizine (ZYRTEC) 10 MG tablet Take 1 tablet (10 mg total) by mouth daily. 30 tablet 2  . furosemide (LASIX) 20 MG tablet Take 1 tablet (20 mg total) by mouth daily. MUST MAKE APPT FOR FURTHER REFILLS 30 tablet 0  . gabapentin (NEURONTIN) 300 MG capsule TAKE 1 CAPSULE THREE TIMES DAILY 90 capsule 3  . hydrochlorothiazide (MICROZIDE) 12.5 MG capsule Take 1 capsule (12.5 mg total) by mouth daily. 90 capsule 1  . Insulin Glargine (TOUJEO SOLOSTAR) 300 UNIT/ML SOPN Inject 40 Units into the skin daily. 10 mL 6  . lisinopril (PRINIVIL,ZESTRIL) 10 MG tablet Take 1 tablet (10 mg total) by mouth daily. 90 tablet 1  . metFORMIN  (GLUCOPHAGE-XR) 500 MG 24 hr tablet Take 2 tablets (1,000 mg total) by mouth 2 (two) times daily. 360 tablet 1  . metoprolol tartrate (LOPRESSOR) 50 MG tablet TAKE 1 TABLET TWICE DAILY 180 tablet 1  . pantoprazole (PROTONIX) 40 MG tablet Take 1 tablet (40 mg total) by mouth daily. 90 tablet 1   No facility-administered medications prior to visit.     ROS Review of Systems  Constitutional: Negative for activity change, appetite change and fatigue.  HENT: Negative for congestion, sinus pressure and sore throat.   Eyes: Negative for visual disturbance.  Respiratory: Negative for cough, chest tightness, shortness of breath and wheezing.   Cardiovascular: Negative for chest pain and palpitations.  Gastrointestinal: Negative for abdominal distention, abdominal pain and constipation.  Endocrine: Negative for polydipsia.  Genitourinary: Negative for dysuria and frequency.  Musculoskeletal:       See hpi  Skin: Negative for rash.  Neurological: Negative for tremors, light-headedness and numbness.       1+ left ankle non pitting edema  Hematological: Does not bruise/bleed easily.  Psychiatric/Behavioral: Negative for agitation and behavioral problems.    Objective:  BP (!) 152/77   Pulse 93   Temp 98 F (36.7 C) (Oral)   Ht 5' 6" (1.676 m)   Wt 246 lb (111.6 kg)   LMP  (LMP Unknown)   SpO2 99%   BMI 39.71 kg/m   BP/Weight 01/31/2018 11/18/8001 07/26/1789  Systolic BP 505 697 948  Diastolic BP 77 72 74  Wt. (Lbs) 246 242.8 236.2  BMI 39.71 39.19 35.91      Physical Exam  Constitutional: She is oriented to person, place, and time. She appears well-developed and well-nourished.  HENT:  Right Ear: External ear normal.  Left Ear: External ear normal.  Mouth/Throat: Oropharynx is clear and moist.  Neck: No JVD present.  Cardiovascular: Normal rate, normal heart sounds and intact distal pulses.  No murmur heard. Pulmonary/Chest: Effort normal and breath sounds normal. She has no  wheezes. She has no rales. She exhibits no tenderness.  Abdominal: Soft. Bowel sounds are normal. She exhibits no distension and no mass. There is no tenderness.  Musculoskeletal:  Tenderness on palpation of anterior shoulder joint. Reduction in range of motion of both shoulder joints with abduction limited to 110 degrees bilaterally Normal handgrip  Neurological: She is alert and oriented to person, place, and time.  Skin: Skin is warm and dry.  Psychiatric: She has a normal mood and affect.     Assessment & Plan:   1. Essential hypertension Initially elevated at 152/77 but repeat performed manually is normal at 142/84 No regimen change today Counseled on blood pressure goal of less than 130/80, low-sodium, DASH diet, medication compliance, 150 minutes of  moderate intensity exercise per week. Discussed medication compliance, adverse effects. - CMP14+EGFR; Future - Lipid panel; Future - hydrochlorothiazide (MICROZIDE) 12.5 MG capsule; Take 1 capsule (12.5 mg total) by mouth daily.  Dispense: 90 capsule; Refill: 1 - lisinopril (PRINIVIL,ZESTRIL) 10 MG tablet; Take 1 tablet (10 mg total) by mouth daily.  Dispense: 90 tablet; Refill: 1 - metoprolol tartrate (LOPRESSOR) 50 MG tablet; TAKE 1 TABLET TWICE DAILY  Dispense: 180 tablet; Refill: 1 - pantoprazole (PROTONIX) 40 MG tablet; Take 1 tablet (40 mg total) by mouth daily.  Dispense: 90 tablet; Refill: 1  2. diabetic retinopathy Status post eye injections [facility]ed [facility] specialist - POCT glucose (manual entry) - POCT glycosylated hemoglobin (Hb A1C) - Microalbumin/Creatinine Ratio, Urine; Future  3. Need for hepatitis C screening test - Hepatitis c antibody (reflex); Future  4. Uncontrolled type 2 diabetes mellitus with hyperglycemia (HCC) Not at goal of less than 7 his A1c is 6.8 Increase Lantus dose Diabetic diet, lifestyle modifications - Insulin Glargine (TOUJEO SOLOSTAR) 300 UNIT/ML SOPN; Inject 43  Units into the skin daily.  Dispense: 30 mL; Refill: 6 - atorvastatin (LIPITOR) 10 MG tablet; Take 1 tablet (10 mg total) by mouth daily.  Dispense: 90 tablet; Refill: 1 - gabapentin (NEURONTIN) 300 MG capsule; TAKE 1 CAPSULE THREE TIMES DAILY  Dispense: 270 capsule; Refill: 1 - metFORMIN (GLUCOPHAGE-XR) 500 MG 24 hr tablet; Take 2 tablets (1,000 mg total) by mouth 2 (two) times daily.  Dispense: 360 tablet; Refill: 1  5. Congestion of upper airway - cetirizine (ZYRTEC) 10 MG tablet; Take 1 tablet (10 mg total) by mouth daily.  Dispense: 30 tablet; Refill: 2  6. Diabetic mononeuropathy associated with type 2 diabetes mellitus (HCC) Stable - gabapentin (NEURONTIN) 300 MG capsule; TAKE 1 CAPSULE THREE TIMES DAILY  Dispense: 270 capsule; Refill: 1  7. Coronary artery disease due to lipid rich plaque - Ambulatory referral to Physical Therapy - pneumococcal 23 valent vaccine (PNU-IMMUNE) 25 MCG/0.5ML injection; Inject 0.5 mLs into the muscle once for 1 dose.  Dispense: 2.5 mL; Refill: 0  8. Adhesive capsulitis of both shoulders Placed on Voltaren gel as she is unable to use oral NSAIDs given cardiac disease   Meds ordered this encounter  Medications  . DISCONTD: furosemide (LASIX) 20 MG tablet    Sig: Take 1 tablet (20 mg total) by mouth daily. MUST MAKE APPT FOR FURTHER REFILLS    Dispense:  30 tablet    Refill:  0  . Insulin Glargine (TOUJEO SOLOSTAR) 300 UNIT/ML SOPN    Sig: Inject 43 Units into the skin daily.    Dispense:  30 mL    Refill:  6    Discontinue previous dose  . diclofenac sodium (VOLTAREN) 1 % GEL    Sig: Apply 4 g topically 4 (four) times daily.    Dispense:  100 g    Refill:  3  . cetirizine (ZYRTEC) 10 MG tablet    Sig: Take 1 tablet (10 mg total) by mouth daily.    Dispense:  30 tablet    Refill:  2  . atorvastatin (LIPITOR) 10 MG tablet    Sig: Take 1 tablet (10 mg total) by mouth daily.    Dispense:  90 tablet    Refill:  1  . furosemide (LASIX) 20 MG  tablet    Sig: Take 1 tablet (20 mg total) by mouth daily.    Dispense:  90 tablet    Refill:  1  .  gabapentin (NEURONTIN) 300 MG capsule    Sig: TAKE 1 CAPSULE THREE TIMES DAILY    Dispense:  270 capsule    Refill:  1  . hydrochlorothiazide (MICROZIDE) 12.5 MG capsule    Sig: Take 1 capsule (12.5 mg total) by mouth daily.    Dispense:  90 capsule    Refill:  1  . lisinopril (PRINIVIL,ZESTRIL) 10 MG tablet    Sig: Take 1 tablet (10 mg total) by mouth daily.    Dispense:  90 tablet    Refill:  1  . metFORMIN (GLUCOPHAGE-XR) 500 MG 24 hr tablet    Sig: Take 2 tablets (1,000 mg total) by mouth 2 (two) times daily.    Dispense:  360 tablet    Refill:  1  . metoprolol tartrate (LOPRESSOR) 50 MG tablet    Sig: TAKE 1 TABLET TWICE DAILY    Dispense:  180 tablet    Refill:  1  . pantoprazole (PROTONIX) 40 MG tablet    Sig: Take 1 tablet (40 mg total) by mouth daily.    Dispense:  90 tablet    Refill:  1  . pneumococcal 23 valent vaccine (PNU-IMMUNE) 25 MCG/0.5ML injection    Sig: Inject 0.5 mLs into the muscle once for 1 dose.    Dispense:  2.5 mL    Refill:  0    Follow-up: Return in about 1 month (around 03/03/2018) for PAP Smear.   Charlott Rakes MD

## 2018-02-01 ENCOUNTER — Encounter: Payer: Self-pay | Admitting: Cardiology

## 2018-02-10 ENCOUNTER — Ambulatory Visit: Payer: Medicare HMO | Attending: Family Medicine

## 2018-02-10 DIAGNOSIS — I10 Essential (primary) hypertension: Secondary | ICD-10-CM | POA: Diagnosis not present

## 2018-02-10 DIAGNOSIS — Z1159 Encounter for screening for other viral diseases: Secondary | ICD-10-CM | POA: Insufficient documentation

## 2018-02-10 DIAGNOSIS — E11649 Type 2 diabetes mellitus with hypoglycemia without coma: Secondary | ICD-10-CM

## 2018-02-10 NOTE — Progress Notes (Signed)
Patient here for lab visit only 

## 2018-02-11 LAB — CMP14+EGFR
ALK PHOS: 89 IU/L (ref 39–117)
ALT: 13 IU/L (ref 0–32)
AST: 14 IU/L (ref 0–40)
Albumin/Globulin Ratio: 1.4 (ref 1.2–2.2)
Albumin: 4 g/dL (ref 3.6–4.8)
BUN / CREAT RATIO: 19 (ref 12–28)
BUN: 21 mg/dL (ref 8–27)
Bilirubin Total: <0.2 mg/dL (ref 0.0–1.2)
CHLORIDE: 107 mmol/L — AB (ref 96–106)
CO2: 23 mmol/L (ref 20–29)
CREATININE: 1.13 mg/dL — AB (ref 0.57–1.00)
Calcium: 9.6 mg/dL (ref 8.7–10.3)
GFR, EST AFRICAN AMERICAN: 61 mL/min/{1.73_m2} (ref >59)
GFR, EST NON AFRICAN AMERICAN: 53 mL/min/{1.73_m2} — AB (ref >59)
GLUCOSE: 53 mg/dL — AB (ref 65–99)
Globulin, Total: 2.9 g/dL (ref 1.5–4.5)
POTASSIUM: 4.7 mmol/L (ref 3.5–5.2)
Sodium: 145 mmol/L — ABNORMAL HIGH (ref 134–144)
Total Protein: 6.9 g/dL (ref 6.0–8.5)

## 2018-02-11 LAB — LIPID PANEL
Chol/HDL Ratio: 1.9 ratio (ref 0.0–4.4)
Cholesterol, Total: 137 mg/dL (ref 100–199)
HDL: 73 mg/dL (ref >39)
LDL Calculated: 52 mg/dL (ref 0–99)
Triglycerides: 59 mg/dL (ref 0–149)
VLDL CHOLESTEROL CAL: 12 mg/dL (ref 5–40)

## 2018-02-11 LAB — HEPATITIS C ANTIBODY (REFLEX)

## 2018-02-11 LAB — HCV COMMENT:

## 2018-02-28 ENCOUNTER — Telehealth (HOSPITAL_COMMUNITY): Payer: Self-pay | Admitting: *Deleted

## 2018-02-28 NOTE — Telephone Encounter (Signed)
Left message on voicemail per DPR in reference to upcoming appointment scheduled on 03/06/18 at 1245 with detailed instructions given per Myocardial Perfusion Study Information Sheet for the test. LM to arrive 15 minutes early, and that it is imperative to arrive on time for appointment to keep from having the test rescheduled. If you need to cancel or reschedule your appointment, please call the office within 24 hours of your appointment. Failure to do so may result in a cancellation of your appointment, and a $50 no show fee. Phone number given for call back for any questions. Lejend Dalby, Ranae [place]ms

## 2018-03-06 ENCOUNTER — Ambulatory Visit (HOSPITAL_COMMUNITY): Payer: Medicare HMO | Attending: Cardiology

## 2018-03-06 DIAGNOSIS — I2510 Atherosclerotic heart disease of native coronary artery without angina pectoris: Secondary | ICD-10-CM

## 2018-03-06 MED ORDER — TECHNETIUM TC 99M TETROFOSMIN IV KIT
32.9000 | PACK | Freq: Once | INTRAVENOUS | Status: AC | PRN
  Administered 2018-03-06: 32.9 via INTRAVENOUS
  Filled 2018-03-06: qty 33

## 2018-03-06 MED ORDER — REGADENOSON 0.4 MG/5ML IV SOLN
0.4000 mg | Freq: Once | INTRAVENOUS | Status: AC
  Administered 2018-03-06: 0.4 mg via INTRAVENOUS

## 2018-03-07 ENCOUNTER — Ambulatory Visit (HOSPITAL_COMMUNITY): Payer: Medicare HMO | Attending: Cardiovascular Disease

## 2018-03-07 LAB — MYOCARDIAL PERFUSION IMAGING
CHL CUP NUCLEAR SRS: 6
CHL CUP NUCLEAR SSS: 7
CHL CUP RESTING HR STRESS: 75 {beats}/min
LV dias vol: 98 mL (ref 46–106)
LV sys vol: 56 mL
Peak HR: 91 {beats}/min
SDS: 1
TID: 1.04

## 2018-03-07 MED ORDER — TECHNETIUM TC 99M TETROFOSMIN IV KIT
31.1000 | PACK | Freq: Once | INTRAVENOUS | Status: AC | PRN
  Administered 2018-03-07: 31.1 via INTRAVENOUS
  Filled 2018-03-07: qty 32

## 2018-03-09 ENCOUNTER — Telehealth: Payer: Self-pay | Admitting: Cardiology

## 2018-03-09 DIAGNOSIS — I2510 Atherosclerotic heart disease of native coronary artery without angina pectoris: Secondary | ICD-10-CM

## 2018-03-09 DIAGNOSIS — I10 Essential (primary) hypertension: Secondary | ICD-10-CM

## 2018-03-09 DIAGNOSIS — R9439 Abnormal result of other cardiovascular function study: Secondary | ICD-10-CM

## 2018-03-09 NOTE — Telephone Encounter (Signed)
New Message   Patient is returning call in reference to stress test results. Please call to discuss.

## 2018-03-09 NOTE — Telephone Encounter (Signed)
Spoke with patient and reviewed the results of her recent nuclear stress test.  She states understandign and understands the need for further testing in the way of an echocardiogram.  I reviewed the testing procedure and advised someone will be calling to scheduled the appt.  She was grateful for the call and results.  NUC stress stress did not show any significant perfusion defects, reassuring.  EF was calculated 42% but this may be a misrepresentation of true EF.  Please check an ECHO to verify EF. Thanks Dx (CAD, CP) Candee Furbish, MD

## 2018-03-15 ENCOUNTER — Other Ambulatory Visit: Payer: Self-pay

## 2018-03-15 ENCOUNTER — Ambulatory Visit (HOSPITAL_COMMUNITY): Payer: Medicare HMO | Attending: Internal Medicine

## 2018-03-15 DIAGNOSIS — I10 Essential (primary) hypertension: Secondary | ICD-10-CM | POA: Diagnosis not present

## 2018-03-15 DIAGNOSIS — R9439 Abnormal result of other cardiovascular function study: Secondary | ICD-10-CM

## 2018-03-15 DIAGNOSIS — I2510 Atherosclerotic heart disease of native coronary artery without angina pectoris: Secondary | ICD-10-CM | POA: Diagnosis not present

## 2018-03-22 ENCOUNTER — Telehealth: Payer: Self-pay

## 2018-03-22 NOTE — Telephone Encounter (Signed)
LMTCB

## 2018-03-22 NOTE — Telephone Encounter (Signed)
Pt given echo results 

## 2018-03-22 NOTE — Telephone Encounter (Signed)
-----   Message from Jerline Pain, MD sent at 03/17/2018  4:13 PM EST ----- Normal ejection fraction.  Mild grade 1 diastolic dysfunction, stiffness to the heart relaxation with mildly increased pulmonary pressures.  This could all be related to obesity.  Continue to encourage weight loss.  Candee Furbish, MD

## 2018-03-22 NOTE — Telephone Encounter (Signed)
Follow up ° ° °Patient is returning call for results. °

## 2018-04-04 DIAGNOSIS — E113511 Type 2 diabetes mellitus with proliferative diabetic retinopathy with macular edema, right eye: Secondary | ICD-10-CM | POA: Diagnosis not present

## 2018-04-14 DIAGNOSIS — E113512 Type 2 diabetes mellitus with proliferative diabetic retinopathy with macular edema, left eye: Secondary | ICD-10-CM | POA: Diagnosis not present

## 2018-05-17 DIAGNOSIS — E113511 Type 2 diabetes mellitus with proliferative diabetic retinopathy with macular edema, right eye: Secondary | ICD-10-CM | POA: Diagnosis not present

## 2018-05-24 DIAGNOSIS — E113592 Type 2 diabetes mellitus with proliferative diabetic retinopathy without macular edema, left eye: Secondary | ICD-10-CM | POA: Diagnosis not present

## 2018-06-28 DIAGNOSIS — E113511 Type 2 diabetes mellitus with proliferative diabetic retinopathy with macular edema, right eye: Secondary | ICD-10-CM | POA: Diagnosis not present

## 2018-07-14 DIAGNOSIS — E113512 Type 2 diabetes mellitus with proliferative diabetic retinopathy with macular edema, left eye: Secondary | ICD-10-CM | POA: Diagnosis not present

## 2018-07-20 ENCOUNTER — Ambulatory Visit: Payer: Self-pay | Admitting: Family Medicine

## 2018-08-09 DIAGNOSIS — E113511 Type 2 diabetes mellitus with proliferative diabetic retinopathy with macular edema, right eye: Secondary | ICD-10-CM | POA: Diagnosis not present

## 2018-08-17 ENCOUNTER — Other Ambulatory Visit: Payer: Self-pay | Admitting: Family Medicine

## 2018-08-17 DIAGNOSIS — I10 Essential (primary) hypertension: Secondary | ICD-10-CM

## 2018-08-25 DIAGNOSIS — E113512 Type 2 diabetes mellitus with proliferative diabetic retinopathy with macular edema, left eye: Secondary | ICD-10-CM | POA: Diagnosis not present

## 2018-08-29 DIAGNOSIS — E113513 Type 2 diabetes mellitus with proliferative diabetic retinopathy with macular edema, bilateral: Secondary | ICD-10-CM | POA: Diagnosis not present

## 2018-08-29 DIAGNOSIS — H3582 Retinal ischemia: Secondary | ICD-10-CM | POA: Diagnosis not present

## 2018-08-29 DIAGNOSIS — H43391 Other vitreous opacities, right eye: Secondary | ICD-10-CM | POA: Diagnosis not present

## 2018-08-29 DIAGNOSIS — H25813 Combined forms of age-related cataract, bilateral: Secondary | ICD-10-CM | POA: Diagnosis not present

## 2018-08-29 DIAGNOSIS — H35033 Hypertensive retinopathy, bilateral: Secondary | ICD-10-CM | POA: Diagnosis not present

## 2018-08-30 ENCOUNTER — Other Ambulatory Visit: Payer: Self-pay | Admitting: Family Medicine

## 2018-08-30 DIAGNOSIS — I10 Essential (primary) hypertension: Secondary | ICD-10-CM

## 2018-08-30 DIAGNOSIS — L409 Psoriasis, unspecified: Secondary | ICD-10-CM

## 2018-08-30 DIAGNOSIS — E1165 Type 2 diabetes mellitus with hyperglycemia: Secondary | ICD-10-CM

## 2018-08-31 ENCOUNTER — Other Ambulatory Visit: Payer: Self-pay

## 2018-08-31 ENCOUNTER — Ambulatory Visit: Payer: Medicare HMO | Attending: Family Medicine | Admitting: Family Medicine

## 2018-08-31 ENCOUNTER — Encounter: Payer: Self-pay | Admitting: Family Medicine

## 2018-08-31 DIAGNOSIS — E1165 Type 2 diabetes mellitus with hyperglycemia: Secondary | ICD-10-CM

## 2018-08-31 DIAGNOSIS — E11319 Type 2 diabetes mellitus with unspecified diabetic retinopathy without macular edema: Secondary | ICD-10-CM

## 2018-08-31 DIAGNOSIS — I10 Essential (primary) hypertension: Secondary | ICD-10-CM | POA: Diagnosis not present

## 2018-08-31 DIAGNOSIS — E1141 Type 2 diabetes mellitus with diabetic mononeuropathy: Secondary | ICD-10-CM | POA: Diagnosis not present

## 2018-08-31 MED ORDER — METFORMIN HCL ER 500 MG PO TB24: 1000.0000 mg | ORAL_TABLET | Freq: Two times a day (BID) | ORAL | 1 refills | Status: DC

## 2018-08-31 MED ORDER — GABAPENTIN 300 MG PO CAPS: 600.0000 mg | ORAL_CAPSULE | Freq: Two times a day (BID) | ORAL | 1 refills | Status: DC

## 2018-08-31 MED ORDER — ATORVASTATIN CALCIUM 10 MG PO TABS: 10.0000 mg | ORAL_TABLET | Freq: Every day | ORAL | 1 refills | Status: DC

## 2018-08-31 MED ORDER — LISINOPRIL 10 MG PO TABS: 10.0000 mg | ORAL_TABLET | Freq: Every day | ORAL | 1 refills | Status: DC

## 2018-08-31 MED ORDER — HYDROCHLOROTHIAZIDE 12.5 MG PO CAPS: 12.5000 mg | ORAL_CAPSULE | Freq: Every day | ORAL | 1 refills | Status: DC

## 2018-08-31 MED ORDER — INSULIN GLARGINE 100 UNIT/ML SOLOSTAR PEN: 45.0000 [IU] | PEN_INJECTOR | Freq: Every day | SUBCUTANEOUS | 3 refills | Status: DC

## 2018-08-31 MED ORDER — METOPROLOL TARTRATE 50 MG PO TABS: ORAL_TABLET | ORAL | 1 refills | Status: DC

## 2018-08-31 NOTE — Progress Notes (Signed)
Patient has been called and DOB has been verified. Patient has been screened and transferred to PCP to start phone visit.     

## 2018-08-31 NOTE — Progress Notes (Signed)
Virtual Visit via Telephone Note  I connected with Deborah Coffey, on 08/31/2018 at 11:00 AM by telephone due to the COVID-19 pandemic and verified that I am speaking with the correct person using two identifiers.   Consent: I discussed the limitations, risks, security and privacy concerns of performing an evaluation and management service by telephone and the availability of in person appointments. I also discussed with the patient that there may be a patient responsible charge related to this service. The patient expressed understanding and agreed to proceed.   Location of Patient: Home  Location of Provider: Clinic   Persons participating in Telemedicine visit: Birttany Dechellis - CMA Dr Margarita Rana - PCP     History of Present Illness: Deborah Coffey  is a 63 year female with a history of Type 2 DM (A1c 7.8), Diabetic neuropathy,Hypertension, CABG (s/p CABG) here for a follow-up visit   Past Medical History:  Diagnosis Date  . Allergy   . Anemia   . CAD in native artery 02/2004   Mid LAD lesoin just after Large D1 --> CABG x 1 LIMA-LAD  . CAP (community acquired pneumonia) 08/07/2014  . Chronic bronchitis ([place])    "I think I get it q yr" (08/07/2014)  . Depression   . High cholesterol   . History of blood transfusion 2005   "related to [facility]"   . Hypertension   . 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 competitive flow  . Type II diabetes mellitus (HCC)    Allergies  Allergen Reactions  . Ciprofloxacin Rash    Current Outpatient Medications on File Prior to Visit  Medication Sig Dispense Refill  . ACCU-CHEK FASTCLIX LANCETS MISC Use as directed to check blood glucose 3 times daily. E11.9 100 each 5  . acetaminophen (TYLENOL) 500 MG tablet Take 1,000 mg by mouth every 6 (six) hours as needed for headache (pain).     . Alcohol Swabs (B-D SINGLE USE SWABS REGULAR) PADS USE AS DIRECTED 100 each 0  .  aspirin EC 81 MG tablet Take 81 mg by mouth daily.    Marland Kitchen atorvastatin (LIPITOR) 10 MG tablet Take 1 tablet (10 mg total) by mouth daily. 90 tablet 1  . Blood Glucose Monitoring Suppl (ACCU-CHEK NANO SMARTVIEW) w/Device KIT Use as directed to check blood glucose 3 times daily. E11.9 1 kit 0  . cetirizine (ZYRTEC) 10 MG tablet Take 1 tablet (10 mg total) by mouth daily. 30 tablet 2  . clobetasol cream (TEMOVATE) 4.16 % Apply 1 application topically 2 (two) times daily. 60 g 3  . diclofenac sodium (VOLTAREN) 1 % GEL APPLY  4  GRAMS  TOPICALLY FOUR TIMES DAILY 400 g 0  . furosemide (LASIX) 20 MG tablet TAKE 1 TABLET EVERY DAY 90 tablet 0  . gabapentin (NEURONTIN) 300 MG capsule TAKE 1 CAPSULE THREE TIMES DAILY 270 capsule 1  . glucose blood (ACCU-CHEK SMARTVIEW) test strip Use as instructed to test blood glucose 3 times daily. E11.9 100 each 12  . hydrochlorothiazide (MICROZIDE) 12.5 MG capsule Take 1 capsule (12.5 mg total) by mouth daily. 90 capsule 1  . Insulin Glargine (TOUJEO SOLOSTAR) 300 UNIT/ML SOPN Inject 43 Units into the skin daily. 30 mL 6  . lisinopril (PRINIVIL,ZESTRIL) 10 MG tablet Take 1 tablet (10 mg total) by mouth daily. 90 tablet 1  . metFORMIN (GLUCOPHAGE-XR) 500 MG 24 hr tablet Take 2 tablets (1,000 mg total) by mouth  2 (two) times daily. 360 tablet 1  . metoprolol tartrate (LOPRESSOR) 50 MG tablet TAKE 1 TABLET TWICE DAILY 180 tablet 1  . Multiple Vitamin (MULTIVITAMIN WITH MINERALS) TABS tablet Take 1 tablet by mouth daily.    . nitroGLYCERIN (NITROSTAT) 0.4 MG SL tablet Place 1 tablet (0.4 mg total) under the tongue every 5 (five) minutes as needed for chest pain. 25 tablet 3  . pantoprazole (PROTONIX) 40 MG tablet TAKE 1 TABLET EVERY DAY 90 tablet 0  . Vitamin D, Ergocalciferol, (DRISDOL) 50000 units CAPS capsule Take 1 capsule by mouth once a week.     No current facility-administered medications on file prior to visit.     Observations/Objective: Awake, alert, oriented  x3 Not in acute distress  CMP Latest Ref Rng & Units 02/10/2018 09/17/2016 09/02/2016  Glucose 65 - 99 mg/dL 53(L) 264(H) 321(H)  BUN 8 - 27 mg/dL 21 16 24(H)  Creatinine 0.57 - 1.00 mg/dL 1.13(H) 1.07(H) 1.40(H)  Sodium 134 - 144 mmol/L 145(H) 138 137  Potassium 3.5 - 5.2 mmol/L 4.7 4.3 4.9  Chloride 96 - 106 mmol/L 107(H) 106 103  CO2 20 - 29 mmol/L 23 24 -  Calcium 8.7 - 10.3 mg/dL 9.6 9.1 -  Total Protein 6.0 - 8.5 g/dL 6.9 - -  Total Bilirubin 0.0 - 1.2 mg/dL <0.2 - -  Alkaline Phos 39 - 117 IU/L 89 - -  AST 0 - 40 IU/L 14 - -  ALT 0 - 32 IU/L 13 - -    Lipid Panel     Component Value Date/Time   CHOL 137 02/10/2018 1052   TRIG 59 02/10/2018 1052   HDL 73 02/10/2018 1052   CHOLHDL 1.9 02/10/2018 1052   CHOLHDL 2.2 09/02/2016 0533   VLDL 13 09/02/2016 0533   LDLCALC 52 02/10/2018 1052   Lab Results  Component Value Date   HGBA1C 7.8 (A) 01/31/2018     Assessment and Plan: 1. Uncontrolled type 2 diabetes mellitus with hyperglycemia (HCC) Uncontrolled with A1c of 7.8 but fasting blood sugars reported have been good No regimen change today A1c at next in person visit in 3 months Counseled on Diabetic diet, my plate method, 027 minutes of moderate intensity exercise/week Keep blood sugar logs with fasting goals of 80-120 mg/dl, random of less than 180 and in the event of sugars less than 60 mg/dl or greater than 400 mg/dl please notify the clinic ASAP. It is recommended that you undergo annual eye exams and annual foot exams. Pneumonia vaccine is recommended. - atorvastatin (LIPITOR) 10 MG tablet; Take 1 tablet (10 mg total) by mouth daily.  Dispense: 90 tablet; Refill: 1 - gabapentin (NEURONTIN) 300 MG capsule; Take 2 capsules (600 mg total) by mouth 2 (two) times daily. TAKE 1 CAPSULE THREE TIMES DAILY  Dispense: 360 capsule; Refill: 1 - metFORMIN (GLUCOPHAGE-XR) 500 MG 24 hr tablet; Take 2 tablets (1,000 mg total) by mouth 2 (two) times daily.  Dispense: 360 tablet;  Refill: 1 - Insulin Glargine (LANTUS SOLOSTAR) 100 UNIT/ML Solostar Pen; Inject 45 Units into the skin daily.  Dispense: 5 pen; Refill: 3  2. Diabetic mononeuropathy associated with type 2 diabetes mellitus (HCC) Uncontrolled Increase gabapentin dose - gabapentin (NEURONTIN) 300 MG capsule; Take 2 capsules (600 mg total) by mouth 2 (two) times daily. TAKE 1 CAPSULE THREE TIMES DAILY  Dispense: 360 capsule; Refill: 1  3. Essential hypertension Controlled - hydrochlorothiazide (MICROZIDE) 12.5 MG capsule; Take 1 capsule (12.5 mg total) by mouth daily.  Dispense: 90 capsule; Refill: 1 - lisinopril (ZESTRIL) 10 MG tablet; Take 1 tablet (10 mg total) by mouth daily.  Dispense: 90 tablet; Refill: 1 - metoprolol tartrate (LOPRESSOR) 50 MG tablet; TAKE 1 TABLET TWICE DAILY  Dispense: 180 tablet; Refill: 1  4.  Diabetic retinopathy Followed by ophthalmology-Dr. Posey Pronto  Follow Up Instructions: 3 months   I discussed the assessment and treatment plan with the patient. The patient was provided an opportunity to ask questions and all were answered. The patient agreed with the plan and demonstrated an understanding of the instructions.   The patient was advised to call back or seek an in-person evaluation if the symptoms worsen or if the condition fails to improve as anticipated.     I provided 20 minutes total of non-face-to-face time during this encounter including median intraservice time, reviewing previous notes, labs, imaging, medications and explaining diagnosis and management.     Charlott Rakes, MD, FAAFP. [facility], [place]   08/31/2018, 11:00 AM

## 2018-09-05 ENCOUNTER — Other Ambulatory Visit: Payer: Self-pay | Admitting: Family Medicine

## 2018-09-07 ENCOUNTER — Telehealth: Payer: Self-pay | Admitting: Pharmacist

## 2018-09-07 DIAGNOSIS — E1165 Type 2 diabetes mellitus with hyperglycemia: Secondary | ICD-10-CM

## 2018-09-07 DIAGNOSIS — E1141 Type 2 diabetes mellitus with diabetic mononeuropathy: Secondary | ICD-10-CM

## 2018-09-07 MED ORDER — GABAPENTIN 300 MG PO CAPS: 600.0000 mg | ORAL_CAPSULE | Freq: Two times a day (BID) | ORAL | 1 refills | Status: DC

## 2018-09-07 NOTE — Telephone Encounter (Signed)
Patient's [facility] pharmacy called regarding gabapentin rx written 08/31/18. Sig states to use two capsules BID but then says to use 1 capsule TID in the same sig. Will forward to pt's PCP for clarification.

## 2018-09-07 NOTE — Telephone Encounter (Signed)
Gabapentin rxn resent.

## 2018-09-14 ENCOUNTER — Other Ambulatory Visit: Payer: Self-pay | Admitting: Pharmacist

## 2018-09-14 DIAGNOSIS — E1165 Type 2 diabetes mellitus with hyperglycemia: Secondary | ICD-10-CM

## 2018-09-14 DIAGNOSIS — E113511 Type 2 diabetes mellitus with proliferative diabetic retinopathy with macular edema, right eye: Secondary | ICD-10-CM | POA: Diagnosis not present

## 2018-09-14 MED ORDER — INSULIN GLARGINE (1 UNIT DIAL) 300 UNIT/ML ~~LOC~~ SOPN: 45.0000 [IU] | PEN_INJECTOR | Freq: Every day | SUBCUTANEOUS | 2 refills | Status: DC

## 2018-09-29 ENCOUNTER — Other Ambulatory Visit: Payer: Self-pay | Admitting: Family Medicine

## 2018-10-20 DIAGNOSIS — E113512 Type 2 diabetes mellitus with proliferative diabetic retinopathy with macular edema, left eye: Secondary | ICD-10-CM | POA: Diagnosis not present

## 2018-10-23 ENCOUNTER — Telehealth: Payer: Self-pay

## 2018-10-23 NOTE — Telephone Encounter (Signed)
YOUR CARDIOLOGY TEAM HAS ARRANGED FOR AN E-VISIT FOR YOUR APPOINTMENT - PLEASE REVIEW IMPORTANT INFORMATION BELOW SEVERAL DAYS PRIOR TO YOUR APPOINTMENT  Due to the recent COVID-19 pandemic, we are transitioning in-person office visits to tele-medicine visits in an effort to decrease unnecessary exposure to our patients, their families, and staff. These visits are billed to your insurance just like a normal visit is. We also encourage you to sign up for MyChart if you have not already done so. You will need a smartphone if possible. For patients that do not have this, we can still complete the visit using a regular telephone but do prefer a smartphone to enable video when possible. You may have a family member that lives with you that can help. If possible, we also ask that you have a blood pressure cuff and scale at home to measure your blood pressure, heart rate and weight prior to your scheduled appointment. Patients with clinical needs that need an in-person evaluation and testing will still be able to come to the office if absolutely necessary. If you have any questions, feel free to call our office.     YOUR PROVIDER WILL BE USING THE FOLLOWING PLATFORM TO COMPLETE YOUR VISIT: [email or web address]  . IF USING MYCHART - How to Download the MyChart App to Your SmartPhone   - If Apple, go to App Store and type in MyChart in the search bar and download the app. If Android, ask patient to go to Google Play Store and type in MyChart in the search bar and download the app. The app is free but as with any other app downloads, your phone may require you to verify saved payment information or Apple/Android password.  - You will need to then log into the app with your MyChart username and password, and select [facility] as your healthcare provider to link the account.  - When it is time for your visit, go to the MyChart app, find appointments, and click Begin Video Visit. Be sure to Select Allow for your device to  access the Microphone and Camera for your visit. You will then be connected, and your provider will be with you shortly.  **If you have any issues connecting or need assistance, please contact MyChart service desk [phone] ([phone])**  **If using a computer, in order to ensure the best quality for your visit, you will need to use either of the following Internet Browsers: Google Chrome or Microsoft Edge**  . IF USING DOXIMITY or [email or web address] - The staff will give you instructions on receiving your link to join the meeting the day of your visit.      2-3 DAYS BEFORE YOUR APPOINTMENT  You will receive a telephone call from one of our [facility] team members - your caller ID may say "Unknown caller." If this is a video visit, we will walk you through how to get the video launched on your phone. We will remind you check your blood pressure, heart rate and weight prior to your scheduled appointment. If you have an Apple Watch or Kardia, please upload any pertinent ECG strips the day before or morning of your appointment to MyChart. Our staff will also make sure you have reviewed the consent and agree to move forward with your scheduled tele-health visit.     THE DAY OF YOUR APPOINTMENT  Approximately 15 minutes prior to your scheduled appointment, you will receive a telephone call from one of HeartCare team - your caller ID may say "Unknown caller."    Our staff will confirm medications, vital signs for the day and any symptoms you may be experiencing. Please have this information available prior to the time of visit start. It may also be helpful for you to have a pad of paper and pen handy for any instructions given during your visit. They will also walk you through joining the smartphone meeting if this is a video visit.    CONSENT FOR TELE-HEALTH VISIT - PLEASE REVIEW  I hereby voluntarily request, consent and authorize [facility] and its employed or contracted physicians, physician  assistants, nurse practitioners or other licensed health care professionals (the Practitioner), to provide me with telemedicine health care services (the "Services") as deemed necessary by the treating Practitioner. I acknowledge and consent to receive the Services by the Practitioner via telemedicine. I understand that the telemedicine visit will involve communicating with the Practitioner through live audiovisual communication technology and the disclosure of certain medical information by electronic transmission. I acknowledge that I have been given the opportunity to request an in-person assessment or other available alternative prior to the telemedicine visit and am voluntarily participating in the telemedicine visit.  I understand that I have the right to withhold or withdraw my consent to the use of telemedicine in the course of my care at any time, without affecting my right to future care or treatment, and that the Practitioner or I may terminate the telemedicine visit at any time. I understand that I have the right to inspect all information obtained and/or recorded in the course of the telemedicine visit and may receive copies of available information for a reasonable fee.  I understand that some of the potential risks of receiving the Services via telemedicine include:  . Delay or interruption in medical evaluation due to technological equipment failure or disruption; . Information transmitted may not be sufficient (e.g. poor resolution of images) to allow for appropriate medical decision making by the Practitioner; and/or  . In rare instances, security protocols could fail, causing a breach of personal health information.  Furthermore, I acknowledge that it is my responsibility to provide information about my medical history, conditions and care that is complete and accurate to the best of my ability. I acknowledge that Practitioner's advice, recommendations, and/or decision may be based on  factors not within their control, such as incomplete or inaccurate data provided by me or distortions of diagnostic images or specimens that may result from electronic transmissions. I understand that the practice of medicine is not an exact science and that Practitioner makes no warranties or guarantees regarding treatment outcomes. I acknowledge that I will receive a copy of this consent concurrently upon execution via email to the email address I last provided but may also request a printed copy by calling the office of [facility].    I understand that my insurance will be billed for this visit.   I have read or had this consent read to me. . I understand the contents of this consent, which adequately explains the benefits and risks of the Services being provided via telemedicine.  . I have been provided ample opportunity to ask questions regarding this consent and the Services and have had my questions answered to my satisfaction. . I give my informed consent for the services to be provided through the use of telemedicine in my medical care  By participating in this telemedicine visit I agree to the above.  

## 2018-10-31 DIAGNOSIS — H4312 Vitreous hemorrhage, left eye: Secondary | ICD-10-CM | POA: Diagnosis not present

## 2018-10-31 DIAGNOSIS — E113512 Type 2 diabetes mellitus with proliferative diabetic retinopathy with macular edema, left eye: Secondary | ICD-10-CM | POA: Diagnosis not present

## 2018-10-31 DIAGNOSIS — H3582 Retinal ischemia: Secondary | ICD-10-CM | POA: Diagnosis not present

## 2018-10-31 DIAGNOSIS — E113511 Type 2 diabetes mellitus with proliferative diabetic retinopathy with macular edema, right eye: Secondary | ICD-10-CM | POA: Diagnosis not present

## 2018-11-01 ENCOUNTER — Telehealth (INDEPENDENT_AMBULATORY_CARE_PROVIDER_SITE_OTHER): Payer: Medicare HMO | Admitting: Cardiology

## 2018-11-01 ENCOUNTER — Encounter: Payer: Self-pay | Admitting: *Deleted

## 2018-11-01 ENCOUNTER — Encounter: Payer: Self-pay | Admitting: Cardiology

## 2018-11-01 ENCOUNTER — Other Ambulatory Visit: Payer: Self-pay

## 2018-11-01 VITALS — Wt 240.0 lb

## 2018-11-01 DIAGNOSIS — Z720 Tobacco use: Secondary | ICD-10-CM

## 2018-11-01 DIAGNOSIS — E119 Type 2 diabetes mellitus without complications: Secondary | ICD-10-CM

## 2018-11-01 DIAGNOSIS — I2510 Atherosclerotic heart disease of native coronary artery without angina pectoris: Secondary | ICD-10-CM

## 2018-11-01 DIAGNOSIS — I10 Essential (primary) hypertension: Secondary | ICD-10-CM

## 2018-11-01 DIAGNOSIS — I2721 Secondary pulmonary arterial hypertension: Secondary | ICD-10-CM | POA: Diagnosis not present

## 2018-11-01 NOTE — Patient Instructions (Signed)
Medication Instructions:   Your physician recommends that you continue on your current medications as directed. Please refer to the Current Medication list given to you today.  If you need a refill on your cardiac medications before your next appointment, please call your pharmacy.     Follow-Up: At [facility], you and your health needs are our priority.  As part of our continuing mission to provide you with exceptional heart care, we have created designated Provider Care Teams.  These Care Teams include your primary Cardiologist (physician) and Advanced Practice Providers (APPs -  Physician Assistants and Nurse Practitioners) who all work together to provide you with the care you need, when you need it.  Your physician wants you to follow-up in: [place] will receive a reminder letter in the mail two months in advance. If you don't receive a letter, please call our office to schedule the follow-up appointment.

## 2018-11-01 NOTE — Progress Notes (Signed)
Virtual Visit via Video Note   This visit type was conducted due to national recommendations for restrictions regarding the COVID-19 Pandemic (e.g. social distancing) in an effort to limit this patient's exposure and mitigate transmission in our community.  Due to her co-morbid illnesses, this patient is at least at moderate risk for complications without adequate follow up.  This format is felt to be most appropriate for this patient at this time.  All issues noted in this document were discussed and addressed.  A limited physical exam was performed with this format.  Please refer to the patient's chart for her consent to telehealth for [facility].   Date:  11/01/2018   ID:  [name], DOB 1955/11/15, MRN [id number]  Patient Location: Home Provider Location: Home  PCP:  Charlott Rakes, MD  Cardiologist:  No primary care provider on file.  Electrophysiologist:  None   Evaluation Performed:  Follow-Up Visit  Chief Complaint: Follow-up of coronary artery disease  History of Present Illness:    Deborah Coffey is a 63 y.o. female with CAD post CABG in 20051 with LIMA to LAD overall doing very well with prior evaluation for chest pain.  Previous troponins are normal.  Echocardiogram showed pulmonary pressures of 50.  Prior facial droop.  Saw neurology.  Nuclear stress test 2014 showed no ischemia.  Has had fluid in feet periodically. Double in lasix helps.  Discussed salt and fluid reduction.  She accidentally had a box hit her in the eye from 1 of her grandkids and she is currently seeing the eye doctor for that.   The patient does not have symptoms concerning for COVID-19 infection (fever, chills, cough, or new shortness of breath).    Past Medical History:  Diagnosis Date  . Allergy   . Anemia   . CAD in native artery 02/2004   Mid LAD lesoin just after Large D1 --> CABG x 1 LIMA-LAD  . CAP (community acquired pneumonia) 08/07/2014  . Chronic bronchitis  ([place])    "I think I get it q yr" (08/07/2014)  . Depression   . High cholesterol   . History of blood transfusion 2005   "related to [facility]"   . Hypertension   . 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 competitive flow  . Type II diabetes mellitus ([place])    Past Surgical History:  Procedure Laterality Date  . CARDIAC CATHETERIZATION  2010   Previous LAD 95% lesion - now ~30-40%; patent LIMA with competitive flow  . CESAREAN SECTION  1977; 1989  . CHOLECYSTECTOMY  ~ 2012  . CORONARY ARTERY BYPASS GRAFT  02/2004   "CABG X1" (03/22/2013); LIMA-LAD  . TUBAL LIGATION  1989     Current Meds  Medication Sig  . ACCU-CHEK FASTCLIX LANCETS MISC Use as directed to check blood glucose 3 times daily. E11.9  . acetaminophen (TYLENOL) 500 MG tablet Take 1,000 mg by mouth every 6 (six) hours as needed for headache (pain).   . Alcohol Swabs (B-D SINGLE USE SWABS REGULAR) PADS USE AS DIRECTED  . aspirin EC 81 MG tablet Take 81 mg by mouth daily.  Marland Kitchen atorvastatin (LIPITOR) 10 MG tablet Take 1 tablet (10 mg total) by mouth daily.  . Blood Glucose Monitoring Suppl (ACCU-CHEK NANO SMARTVIEW) w/Device KIT Use as directed to check blood glucose 3 times daily. E11.9  . cetirizine (ZYRTEC) 10 MG tablet Take 1 tablet (10 mg total) by mouth daily.  Marland Kitchen  clobetasol cream (TEMOVATE) 4.33 % Apply 1 application topically 2 (two) times daily.  . diclofenac sodium (VOLTAREN) 1 % GEL APPLY 4 GRAMS TOPICALLY FOUR TIMES DAILY  . furosemide (LASIX) 20 MG tablet TAKE 1 TABLET EVERY DAY  . gabapentin (NEURONTIN) 300 MG capsule Take 2 capsules (600 mg total) by mouth 2 (two) times daily.  Marland Kitchen glucose blood (ACCU-CHEK SMARTVIEW) test strip Use as instructed to test blood glucose 3 times daily. E11.9  . hydrochlorothiazide (MICROZIDE) 12.5 MG capsule Take 1 capsule (12.5 mg total) by mouth daily.  . Insulin Glargine, 1 Unit Dial, (TOUJEO SOLOSTAR) 300 UNIT/ML SOPN Inject 45 Units into  the skin daily.  Marland Kitchen lisinopril (ZESTRIL) 10 MG tablet Take 1 tablet (10 mg total) by mouth daily.  . metFORMIN (GLUCOPHAGE-XR) 500 MG 24 hr tablet Take 2 tablets (1,000 mg total) by mouth 2 (two) times daily.  . metoprolol tartrate (LOPRESSOR) 50 MG tablet TAKE 1 TABLET TWICE DAILY  . Multiple Vitamin (MULTIVITAMIN WITH MINERALS) TABS tablet Take 1 tablet by mouth daily.  . nitroGLYCERIN (NITROSTAT) 0.4 MG SL tablet Place 1 tablet (0.4 mg total) under the tongue every 5 (five) minutes as needed for chest pain.  . pantoprazole (PROTONIX) 40 MG tablet TAKE 1 TABLET EVERY DAY  . Vitamin D, Ergocalciferol, (DRISDOL) 50000 units CAPS capsule Take 1 capsule by mouth once a week.     Allergies:   Ciprofloxacin   Social History   Tobacco Use  . Smoking status: Current Some Day Smoker    Packs/day: 0.50    Years: 42.00    Pack years: 21.00    Types: Cigarettes  . Smokeless tobacco: Never Used  Substance Use Topics  . Alcohol use: Yes    Alcohol/week: 1.0 - 2.0 standard drinks    Types: 1 - 2 Glasses of wine per week    Comment: 08/07/2014 "glass of wine maybe 2 times/yr"  . Drug use: No    Types: Marijuana    Comment: smoked pot years ago but not currently     Family Hx: The patient's family history includes Cancer in her mother; Diabetes in her brother, brother, father, mother, and sister; Heart attack in her brother; Heart disease in her mother and sister; Heart failure in her brother and sister; Hepatitis C in her sister; Hypertension in her daughter; Lupus in her sister. There is no history of Colon cancer.  ROS:   Please see the history of present illness.    No fever chills nausea vomiting syncope bleeding All other systems reviewed and are negative.   Prior CV studies:   The following studies were reviewed today:  Echo 2018-EF 60%, PA pressure 50  Nuclear stress 2014-no ischemia  Labs/Other Tests and Data Reviewed:    EKG:  Prior EKG reviewed showing subtle T wave  inversion in V1 V2.  Recent Labs: 02/10/2018: ALT 13; BUN 21; Creatinine, Ser 1.13; Potassium 4.7; Sodium 145   Recent Lipid Panel Lab Results  Component Value Date/Time   CHOL 137 02/10/2018 10:52 AM   TRIG 59 02/10/2018 10:52 AM   HDL 73 02/10/2018 10:52 AM   CHOLHDL 1.9 02/10/2018 10:52 AM   CHOLHDL 2.2 09/02/2016 05:33 AM   LDLCALC 52 02/10/2018 10:52 AM    Wt Readings from Last 3 Encounters:  11/01/18 240 lb (108.9 kg)  03/06/18 246 lb (111.6 kg)  01/31/18 246 lb (111.6 kg)     Objective:    Vital Signs:  Wt 240 lb (108.9 kg)  LMP  (LMP Unknown)   BMI 38.74 kg/m    VITAL SIGNS:  reviewed GEN:  no acute distress EYES:  sclerae anicteric, EOMI - Extraocular Movements Intact RESPIRATORY:  normal respiratory effort, symmetric expansion SKIN:  no rash, lesions or ulcers. MUSCULOSKELETAL:  no obvious deformities. NEURO:  alert and oriented x 3, no obvious focal deficit PSYCH:  normal affect  ASSESSMENT & PLAN:    Coronary artery disease status post CABG 1 in 2005  - LIMA to LAD  - Echocardiogram shows normal wall motion.   -Very well, no chest pain.  No changes made.   Secondary pulmonary hypertension  - Smoking, weight discussed both.  Decrease carbohydrates.  Overall doing well.  No changes made  Tobacco use  - Continue to encourage cessation.  This is of utmost importance for cardiovascular health.  Her grandchildren often remind her to try to stop.  She is still struggling with this.  Prior TIA  - Stable.   No new symptoms.  Doing well.  Diabetes with hypertension  - Medications reviewed. Per primary.  Trying to watch sweets.  Grade 1 diastolic dysfunction  - This finding is to be expected with age as well as obesity.  - Continue with current medications.  Blood pressure control.  Pulmonary hypertension, secondary -Continue to encourage smoking cessation.  Weight loss.  Spoke out this again today.  COVID-19 Education: The signs and symptoms  of COVID-19 were discussed with the patient and how to seek care for testing (follow up with PCP or arrange E-visit).  The importance of social distancing was discussed today.  Time:   Today, I have spent 12 minutes with the patient with telehealth technology discussing the above problems.     Medication Adjustments/Labs and Tests Ordered: Current medicines are reviewed at length with the patient today.  Concerns regarding medicines are outlined above.   Tests Ordered: No orders of the defined types were placed in this encounter.   Medication Changes: No orders of the defined types were placed in this encounter.   Follow Up:  Virtual Visit or In Person in 1 year(s)  Signed, Candee Furbish, MD  11/01/2018 9:26 AM    [place]

## 2018-11-13 DIAGNOSIS — E113513 Type 2 diabetes mellitus with proliferative diabetic retinopathy with macular edema, bilateral: Secondary | ICD-10-CM | POA: Diagnosis not present

## 2018-11-13 DIAGNOSIS — H40053 Ocular hypertension, bilateral: Secondary | ICD-10-CM | POA: Diagnosis not present

## 2018-11-13 DIAGNOSIS — H25813 Combined forms of age-related cataract, bilateral: Secondary | ICD-10-CM | POA: Diagnosis not present

## 2018-11-13 DIAGNOSIS — H3582 Retinal ischemia: Secondary | ICD-10-CM | POA: Diagnosis not present

## 2018-11-13 DIAGNOSIS — H4312 Vitreous hemorrhage, left eye: Secondary | ICD-10-CM | POA: Diagnosis not present

## 2018-12-04 ENCOUNTER — Other Ambulatory Visit: Payer: Self-pay | Admitting: Family Medicine

## 2018-12-04 DIAGNOSIS — I10 Essential (primary) hypertension: Secondary | ICD-10-CM

## 2018-12-04 DIAGNOSIS — L409 Psoriasis, unspecified: Secondary | ICD-10-CM

## 2018-12-06 ENCOUNTER — Other Ambulatory Visit: Payer: Self-pay | Admitting: Pharmacist

## 2018-12-06 DIAGNOSIS — E1165 Type 2 diabetes mellitus with hyperglycemia: Secondary | ICD-10-CM

## 2018-12-06 MED ORDER — INSULIN PEN NEEDLE 31G X 8 MM MISC: 4 refills | Status: DC

## 2018-12-12 DIAGNOSIS — H52209 Unspecified astigmatism, unspecified eye: Secondary | ICD-10-CM | POA: Diagnosis not present

## 2018-12-12 DIAGNOSIS — H5213 Myopia, bilateral: Secondary | ICD-10-CM | POA: Diagnosis not present

## 2018-12-13 DIAGNOSIS — E113512 Type 2 diabetes mellitus with proliferative diabetic retinopathy with macular edema, left eye: Secondary | ICD-10-CM | POA: Diagnosis not present

## 2018-12-19 ENCOUNTER — Other Ambulatory Visit: Payer: Self-pay | Admitting: Internal Medicine

## 2018-12-19 ENCOUNTER — Other Ambulatory Visit: Payer: Self-pay

## 2018-12-19 DIAGNOSIS — Z1231 Encounter for screening mammogram for malignant neoplasm of breast: Secondary | ICD-10-CM

## 2018-12-21 DIAGNOSIS — H43391 Other vitreous opacities, right eye: Secondary | ICD-10-CM | POA: Diagnosis not present

## 2018-12-21 DIAGNOSIS — H35033 Hypertensive retinopathy, bilateral: Secondary | ICD-10-CM | POA: Diagnosis not present

## 2018-12-21 DIAGNOSIS — E113513 Type 2 diabetes mellitus with proliferative diabetic retinopathy with macular edema, bilateral: Secondary | ICD-10-CM | POA: Diagnosis not present

## 2018-12-21 DIAGNOSIS — H25813 Combined forms of age-related cataract, bilateral: Secondary | ICD-10-CM | POA: Diagnosis not present

## 2018-12-21 DIAGNOSIS — H3582 Retinal ischemia: Secondary | ICD-10-CM | POA: Diagnosis not present

## 2018-12-30 ENCOUNTER — Other Ambulatory Visit: Payer: Self-pay | Admitting: Family Medicine

## 2018-12-30 DIAGNOSIS — I10 Essential (primary) hypertension: Secondary | ICD-10-CM

## 2018-12-30 DIAGNOSIS — L409 Psoriasis, unspecified: Secondary | ICD-10-CM

## 2019-01-17 DIAGNOSIS — E113511 Type 2 diabetes mellitus with proliferative diabetic retinopathy with macular edema, right eye: Secondary | ICD-10-CM | POA: Diagnosis not present

## 2019-01-19 ENCOUNTER — Other Ambulatory Visit: Payer: Self-pay | Admitting: Family Medicine

## 2019-01-24 DIAGNOSIS — E113512 Type 2 diabetes mellitus with proliferative diabetic retinopathy with macular edema, left eye: Secondary | ICD-10-CM | POA: Diagnosis not present

## 2019-02-05 ENCOUNTER — Other Ambulatory Visit: Payer: Self-pay

## 2019-02-05 ENCOUNTER — Ambulatory Visit
Admission: RE | Admit: 2019-02-05 | Discharge: 2019-02-05 | Disposition: A | Discharge status: Home / Self Care | Payer: Medicare HMO | Source: Ambulatory Visit | Attending: Internal Medicine | Admitting: Internal Medicine

## 2019-02-05 DIAGNOSIS — Z1231 Encounter for screening mammogram for malignant neoplasm of breast: Secondary | ICD-10-CM | POA: Diagnosis not present

## 2019-02-07 DIAGNOSIS — E113512 Type 2 diabetes mellitus with proliferative diabetic retinopathy with macular edema, left eye: Secondary | ICD-10-CM | POA: Diagnosis not present

## 2019-02-09 ENCOUNTER — Encounter: Payer: Self-pay | Admitting: Family Medicine

## 2019-02-09 ENCOUNTER — Ambulatory Visit: Payer: Medicare HMO | Attending: Family Medicine | Admitting: Family Medicine

## 2019-02-09 ENCOUNTER — Telehealth: Payer: Self-pay | Admitting: *Deleted

## 2019-02-09 ENCOUNTER — Other Ambulatory Visit: Payer: Self-pay

## 2019-02-09 DIAGNOSIS — L409 Psoriasis, unspecified: Secondary | ICD-10-CM

## 2019-02-09 DIAGNOSIS — L408 Other psoriasis: Secondary | ICD-10-CM

## 2019-02-09 DIAGNOSIS — E1141 Type 2 diabetes mellitus with diabetic mononeuropathy: Secondary | ICD-10-CM

## 2019-02-09 DIAGNOSIS — I10 Essential (primary) hypertension: Secondary | ICD-10-CM | POA: Diagnosis not present

## 2019-02-09 DIAGNOSIS — E1165 Type 2 diabetes mellitus with hyperglycemia: Secondary | ICD-10-CM

## 2019-02-09 MED ORDER — FUROSEMIDE 20 MG PO TABS: 20.0000 mg | ORAL_TABLET | Freq: Every day | ORAL | 1 refills | Status: DC

## 2019-02-09 MED ORDER — TOUJEO SOLOSTAR 300 UNIT/ML ~~LOC~~ SOPN: 55.0000 [IU]/d | PEN_INJECTOR | Freq: Every day | SUBCUTANEOUS | 3 refills | Status: DC

## 2019-02-09 MED ORDER — HYDROCHLOROTHIAZIDE 12.5 MG PO CAPS: 12.5000 mg | ORAL_CAPSULE | Freq: Every day | ORAL | 1 refills | Status: DC

## 2019-02-09 MED ORDER — ATORVASTATIN CALCIUM 10 MG PO TABS: 10.0000 mg | ORAL_TABLET | Freq: Every day | ORAL | 1 refills | Status: DC

## 2019-02-09 MED ORDER — PANTOPRAZOLE SODIUM 40 MG PO TBEC: 40.0000 mg | DELAYED_RELEASE_TABLET | Freq: Two times a day (BID) | ORAL | 1 refills | Status: DC

## 2019-02-09 MED ORDER — METOPROLOL TARTRATE 50 MG PO TABS: ORAL_TABLET | ORAL | 1 refills | Status: DC

## 2019-02-09 MED ORDER — LISINOPRIL 10 MG PO TABS: 10.0000 mg | ORAL_TABLET | Freq: Every day | ORAL | 1 refills | Status: DC

## 2019-02-09 MED ORDER — GABAPENTIN 300 MG PO CAPS: 600.0000 mg | ORAL_CAPSULE | Freq: Two times a day (BID) | ORAL | 1 refills | Status: DC

## 2019-02-09 MED ORDER — METFORMIN HCL ER 500 MG PO TB24: 1000.0000 mg | ORAL_TABLET | Freq: Two times a day (BID) | ORAL | 1 refills | Status: DC

## 2019-02-09 MED ORDER — CLOBETASOL PROPIONATE 0.05 % EX CREA: TOPICAL_CREAM | Freq: Two times a day (BID) | CUTANEOUS | 0 refills | Status: DC

## 2019-02-09 MED ORDER — TOUJEO SOLOSTAR 300 UNIT/ML ~~LOC~~ SOPN: 55.0000 [IU]/d | PEN_INJECTOR | Freq: Every day | SUBCUTANEOUS | 0 refills | Status: DC

## 2019-02-09 NOTE — Progress Notes (Signed)
Established Patient Telehealth visit  Subjective:  Patient ID: Deborah Coffey, female    DOB: March 16, 1956  Age: 63 y.o. MRN: [id number]   Virtual Visit via Telephone Note  I connected with Deborah Coffey on 02/13/19 at  3:10 PM EDT by telephone and verified that I am speaking with the correct person using two identifiers.   I discussed the limitations, risks, security and privacy concerns of performing an evaluation and management service by telephone and the availability of in person appointments. I also discussed with the patient that there may be a patient responsible charge related to this service. The patient expressed understanding and agreed to proceed.  Patient Location: home Provider Location: [facility] Others participating in call: none  CC: follow-up of DM/chronic issues  HPI Deborah Coffey, 63 year old female with multiple medical issues including diabetes with peripheral neuropathy, diabetic retinopathy of the right eye, hypertension and history of CAD status post CABG, who had telemedicine visit on 08/31/2018 with Dr. Margarita Rana.  She had have been asked to come in to have hemoglobin A1c as her last hemoglobin A1c was done 01/31/2018 and was 7.8.  Patient states that she has been reluctant to come into the office due to the current COVID-19 pandemic.  She reports that her blood sugars continue to remain controlled with fasting blood sugars generally in the 130s or less.  Blood sugars are occasionally higher if she eats high carbohydrate meals.  She reports compliance with medications.  No current issues with increased thirst or urinary frequency.  She does continue to have issues with diabetic neuropathy and needs refill of gabapentin.  She has been seen by her eye doctor for diabetic eye exam and diagnosis of diabetic retinopathy in the right eye.  She reports that she has had telemedicine visit with her cardiologist, Dr. Marlou Porch, earlier in the year.  She denies any issues  currently with chest pain or palpitations.  She does need refill of atorvastatin.  She does not feel as if she is having any current issues with increased muscle aches with the use of her cholesterol medicine.  She also continues to take a daily aspirin.  She denies any blood in the stool or dark stools.  She does have some occasional reflux symptoms but not on a regular basis.  She also reports the need for refill of blood pressure medication, lisinopril.  She denies any cough associated with the use of this medication.  She also reports issues with psoriasis of the scalp and requests prescription for clobetasol cream.  Past Medical History:  Diagnosis Date  . Allergy   . Anemia   . CAD in native artery 02/2004   Mid LAD lesoin just after Large D1 --> CABG x 1 LIMA-LAD  . CAP (community acquired pneumonia) 08/07/2014  . Chronic bronchitis ([place])    "I think I get it q yr" (08/07/2014)  . Depression   . High cholesterol   . History of blood transfusion 2005   "related to [facility]"   . Hypertension   . 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 competitive flow  . Type II diabetes mellitus ([place])     Past Surgical History:  Procedure Laterality Date  . CARDIAC CATHETERIZATION  2010   Previous LAD 95% lesion - now ~30-40%; patent LIMA with competitive flow  . CESAREAN SECTION  1977; 1989  . CHOLECYSTECTOMY  ~ 2012  . CORONARY ARTERY BYPASS GRAFT  02/2004   "  CABG X1" (03/22/2013); LIMA-LAD  . TUBAL LIGATION  1989    Family History  Problem Relation Age of Onset  . Heart disease Mother   . Diabetes Mother   . Cancer Mother        cervical  . Diabetes Father   . Diabetes Sister   . Heart disease Sister   . Diabetes Brother   . Heart attack Brother   . Lupus Sister   . Heart failure Sister   . Hepatitis C Sister   . Heart failure Brother   . Diabetes Brother   . Hypertension Daughter   . Colon cancer Neg Hx     Social History    Socioeconomic History  . Marital status: Divorced    Spouse name: Not on file  . Number of children: Not on file  . Years of education: Not on file  . Highest education level: Not on file  Occupational History  . Not on file  Social Needs  . Financial resource strain: Not on file  . Food insecurity    Worry: Not on file    Inability: Not on file  . Transportation needs    Medical: Not on file    Non-medical: Not on file  Tobacco Use  . Smoking status: Current Some Day Smoker    Packs/day: 0.50    Years: 42.00    Pack years: 21.00    Types: Cigarettes  . Smokeless tobacco: Never Used  Substance and Sexual Activity  . Alcohol use: Yes    Alcohol/week: 1.0 - 2.0 standard drinks    Types: 1 - 2 Glasses of wine per week    Comment: 08/07/2014 "glass of wine maybe 2 times/yr"  . Drug use: No    Types: Marijuana    Comment: smoked pot years ago but not currently  . Sexual activity: Not Currently  Lifestyle  . Physical activity    Days per week: Not on file    Minutes per session: Not on file  . Stress: Not on file  Relationships  . Social Herbalist on phone: Not on file    Gets together: Not on file    Attends religious service: Not on file    Active member of club or organization: Not on file    Attends meetings of clubs or organizations: Not on file    Relationship status: Not on file  . Intimate partner violence    Fear of current or ex partner: Not on file    Emotionally abused: Not on file    Physically abused: Not on file    Forced sexual activity: Not on file  Other Topics Concern  . Not on file  Social History Narrative  . Not on file    Outpatient Medications Prior to Visit  Medication Sig Dispense Refill  . ACCU-CHEK FASTCLIX LANCETS MISC Use as directed to check blood glucose 3 times daily. E11.9 100 each 5  . acetaminophen (TYLENOL) 500 MG tablet Take 1,000 mg by mouth every 6 (six) hours as needed for headache (pain).     . Alcohol Swabs  (B-D SINGLE USE SWABS REGULAR) PADS USE AS DIRECTED 100 each 0  . aspirin EC 81 MG tablet Take 81 mg by mouth daily.    Marland Kitchen atorvastatin (LIPITOR) 10 MG tablet Take 1 tablet (10 mg total) by mouth daily. 90 tablet 1  . Blood Glucose Monitoring Suppl (ACCU-CHEK NANO SMARTVIEW) w/Device KIT Use as directed to check blood glucose  3 times daily. E11.9 1 kit 0  . cetirizine (ZYRTEC) 10 MG tablet Take 1 tablet (10 mg total) by mouth daily. 30 tablet 2  . clobetasol cream (TEMOVATE) 0.05 % APPLY TOPICALLY TWICE DAILY 60 g 0  . diclofenac sodium (VOLTAREN) 1 % GEL APPLY 4 GRAMS TOPICALLY FOUR TIMES DAILY 400 g 0  . furosemide (LASIX) 20 MG tablet TAKE 1 TABLET EVERY DAY 30 tablet 0  . gabapentin (NEURONTIN) 300 MG capsule Take 2 capsules (600 mg total) by mouth 2 (two) times daily. 360 capsule 1  . glucose blood (ACCU-CHEK SMARTVIEW) test strip Use as instructed to test blood glucose 3 times daily. E11.9 100 each 12  . hydrochlorothiazide (MICROZIDE) 12.5 MG capsule Take 1 capsule (12.5 mg total) by mouth daily. 90 capsule 1  . Insulin Pen Needle 31G X 8 MM MISC Use to inject Toujeo once daily. 100 each 4  . lisinopril (ZESTRIL) 10 MG tablet Take 1 tablet (10 mg total) by mouth daily. 90 tablet 1  . metFORMIN (GLUCOPHAGE-XR) 500 MG 24 hr tablet Take 2 tablets (1,000 mg total) by mouth 2 (two) times daily. 360 tablet 1  . metoprolol tartrate (LOPRESSOR) 50 MG tablet TAKE 1 TABLET TWICE DAILY 180 tablet 1  . Multiple Vitamin (MULTIVITAMIN WITH MINERALS) TABS tablet Take 1 tablet by mouth daily.    . nitroGLYCERIN (NITROSTAT) 0.4 MG SL tablet Place 1 tablet (0.4 mg total) under the tongue every 5 (five) minutes as needed for chest pain. 25 tablet 3  . pantoprazole (PROTONIX) 40 MG tablet TAKE 1 TABLET EVERY DAY (Patient taking differently: 40 mg 2 (two) times daily. ) 30 tablet 0  . TOUJEO SOLOSTAR 300 UNIT/ML SOPN INJECT 45 UNITS INTO THE SKIN DAILY. 4.5 mL 0  . Vitamin D, Ergocalciferol, (DRISDOL) 50000  units CAPS capsule Take 1 capsule by mouth once a week.     No facility-administered medications prior to visit.     Allergies  Allergen Reactions  . Ciprofloxacin Rash     Objective:    Physical Exam No physical exam or vital signs done as visit was conducted by telephone LMP  (LMP Unknown)  Wt Readings from Last 3 Encounters:  11/01/18 240 lb (108.9 kg)  03/06/18 246 lb (111.6 kg)  01/31/18 246 lb (111.6 kg)     Health Maintenance Due  Topic Date Due  . OPHTHALMOLOGY EXAM  04/03/1967  . TETANUS/TDAP  04/02/1976  . PAP SMEAR-Modifier  12/05/2015  . HEMOGLOBIN A1C  08/02/2018  . INFLUENZA VACCINE  11/18/2018  . FOOT EXAM  02/01/2019  On review of chart, patient has been seen for diabetic eye exam 12/13/2018 and 01/17/2019 by Dr. 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 in the year  Lab Results  Component Value Date   TSH 1.086 12/29/2012   Lab Results  Component Value Date   WBC 8.0 03/22/2017   HGB 10.3 (L) 03/22/2017   HCT 31.2 (L) 03/22/2017   MCV 82 03/22/2017   PLT 315 03/22/2017   Lab Results  Component Value Date   NA 145 (H) 02/10/2018   K 4.7 02/10/2018   CO2 23 02/10/2018   GLUCOSE 53 (L) 02/10/2018   BUN 21 02/10/2018   CREATININE 1.13 (H) 02/10/2018   BILITOT <0.2 02/10/2018   ALKPHOS 89 02/10/2018   AST 14 02/10/2018   ALT 13 02/10/2018   PROT 6.9 02/10/2018  ALBUMIN 4.0 02/10/2018   CALCIUM 9.6 02/10/2018   ANIONGAP 8 09/17/2016   Lab Results  Component Value Date   CHOL 137 02/10/2018   Lab Results  Component Value Date   HDL 73 02/10/2018   Lab Results  Component Value Date   LDLCALC 52 02/10/2018   Lab Results  Component Value Date   TRIG 59 02/10/2018   Lab Results  Component Value Date   CHOLHDL 1.9 02/10/2018   Lab Results  Component Value Date   HGBA1C 7.8 (A) 01/31/2018      Assessment & Plan:    1. Uncontrolled type 2 diabetes mellitus with hyperglycemia (HCC) RVR chart, patient's last hemoglobin A1c was done 01/31/2018 and was 7.8 at that time.  Patient was provided with refills of Toujeo and Metformin as well as refills of gabapentin for neuropathy/neuropathic pain and atorvastatin for hyperlipidemia.  Patient with outstanding lab orders in place and she was asked to have lab visit to have comprehensive metabolic panel, lipid panel and urine microalbumin.  She is encouraged to continue to monitor her blood sugars and remain compliant with medications.  She has been asked to schedule follow-up with her primary care provider within the next 4 months but may need sooner appointment if her lab values are abnormal. - atorvastatin (LIPITOR) 10 MG tablet; Take 1 tablet (10 mg total) by mouth daily.  Dispense: 90 tablet; Refill: 1 - gabapentin (NEURONTIN) 300 MG capsule; Take 2 capsules (600 mg total) by mouth 2 (two) times daily.  Dispense: 360 capsule; Refill: 1 - metFORMIN (GLUCOPHAGE-XR) 500 MG 24 hr tablet; Take 2 tablets (1,000 mg total) by mouth 2 (two) times daily.  Dispense: 360 tablet; Refill: 1 - Insulin Glargine, 1 Unit Dial, (TOUJEO SOLOSTAR) 300 UNIT/ML SOPN; Inject 55 Units/day into the skin daily.  Dispense: 5.5 mL; Refill: 0  2. Psoriasis of scalp Prescription provided for clobetasol cream which patient states helps with her scalp psoriasis. - clobetasol cream (TEMOVATE) 0.05 %; Apply topically 2 (two) times daily.  Dispense: 60 g; Refill: 0  3. Diabetic mononeuropathy associated with type 2 diabetes mellitus (HCC) Gabapentin refill provided and patient was encouraged to perform daily diabetic foot exams and to seek attention for any issues such as calluses, fissures - gabapentin (NEURONTIN) 300 MG capsule; Take 2 capsules (600 mg total) by mouth 2 (two) times daily.  Dispense: 360 capsule; Refill: 1  4. Essential hypertension She reports her blood pressures been controlled on  her current medications and refills provided of hydrochlorothiazide, lisinopril and metoprolol.  Continue low-sodium diet and regular exercise. - hydrochlorothiazide (MICROZIDE) 12.5 MG capsule; Take 1 capsule (12.5 mg total) by mouth daily.  Dispense: 90 capsule; Refill: 1 - lisinopril (ZESTRIL) 10 MG tablet; Take 1 tablet (10 mg total) by mouth daily.  Dispense: 90 tablet; Refill: 1 - metoprolol tartrate (LOPRESSOR) 50 MG tablet; TAKE 1 TABLET TWICE DAILY  Dispense: 180 tablet; Refill: 1  Follow Up Instructions:Return in about 4 months (around 06/12/2019) for Chronic issues, sooner if needed.    I discussed the assessment and treatment plan with the patient. The patient was provided an opportunity to ask questions and all were answered. The patient agreed with the plan and demonstrated an understanding of the instructions.   The patient was advised to call back or seek an in-person evaluation if the symptoms worsen or if the condition fails to improve as anticipated.  I provided  21 minutes of non-face-to-face time during this encounter.   Brandon Wiechman,  MD 

## 2019-02-09 NOTE — Progress Notes (Signed)
Med refills-pt uses mail order

## 2019-02-09 NOTE — Telephone Encounter (Signed)
-----   Message from Tresa Garter, MD sent at 02/05/2019  6:22 PM EDT ----- Please inform patient that her screening mammogram shows no evidence of malignancy. Recommend screening mammogram in one year

## 2019-02-09 NOTE — Telephone Encounter (Signed)
Patient verified DOB Patient is aware of mammogram showing no evidence of malignancy and to repeat in one year.

## 2019-02-12 ENCOUNTER — Other Ambulatory Visit: Payer: Self-pay

## 2019-02-12 ENCOUNTER — Telehealth: Payer: Self-pay | Admitting: Family Medicine

## 2019-02-12 ENCOUNTER — Ambulatory Visit: Payer: Medicare HMO | Attending: Family Medicine

## 2019-02-12 DIAGNOSIS — I10 Essential (primary) hypertension: Secondary | ICD-10-CM

## 2019-02-12 DIAGNOSIS — E1165 Type 2 diabetes mellitus with hyperglycemia: Secondary | ICD-10-CM

## 2019-02-12 DIAGNOSIS — L409 Psoriasis, unspecified: Secondary | ICD-10-CM | POA: Diagnosis not present

## 2019-02-12 MED FILL — TOUJEO SOLOSTAR 300 UNITS/M: 300 | 27 days supply | Qty: 8 | Fill #0

## 2019-02-12 NOTE — Telephone Encounter (Signed)
Patient called stating she had a missed call from our facility states she is unsure who called. Please follow up.

## 2019-02-13 LAB — LIPID PANEL
Chol/HDL Ratio: 2.1 ratio (ref 0.0–4.4)
Cholesterol, Total: 141 mg/dL (ref 100–199)
HDL: 68 mg/dL
LDL Chol Calc (NIH): 51 mg/dL (ref 0–99)
Triglycerides: 126 mg/dL (ref 0–149)
VLDL Cholesterol Cal: 22 mg/dL (ref 5–40)

## 2019-02-13 LAB — CBC
Hematocrit: 30.8 % — ABNORMAL LOW (ref 34.0–46.6)
Hemoglobin: 9.4 g/dL — ABNORMAL LOW (ref 11.1–15.9)
MCH: 25.3 pg — ABNORMAL LOW (ref 26.6–33.0)
MCHC: 30.5 g/dL — ABNORMAL LOW (ref 31.5–35.7)
MCV: 83 fL (ref 79–97)
Platelets: 309 x10E3/uL (ref 150–450)
RBC: 3.71 x10E6/uL — ABNORMAL LOW (ref 3.77–5.28)
RDW: 16.6 % — ABNORMAL HIGH (ref 11.7–15.4)
WBC: 6.2 x10E3/uL (ref 3.4–10.8)

## 2019-02-13 LAB — BASIC METABOLIC PANEL WITH GFR
BUN/Creatinine Ratio: 19 (ref 12–28)
BUN: 20 mg/dL (ref 8–27)
CO2: 22 mmol/L (ref 20–29)
Calcium: 9.6 mg/dL (ref 8.7–10.3)
Chloride: 105 mmol/L (ref 96–106)
Creatinine, Ser: 1.04 mg/dL — ABNORMAL HIGH (ref 0.57–1.00)
GFR calc Af Amer: 67 mL/min/1.73
GFR calc non Af Amer: 58 mL/min/1.73 — ABNORMAL LOW
Glucose: 375 mg/dL — ABNORMAL HIGH (ref 65–99)
Potassium: 4.4 mmol/L (ref 3.5–5.2)
Sodium: 141 mmol/L (ref 134–144)

## 2019-02-13 LAB — THYROID PANEL WITH TSH
Free Thyroxine Index: 1.9 (ref 1.2–4.9)
T3 Uptake Ratio: 29 % (ref 24–39)
T4, Total: 6.7 ug/dL (ref 4.5–12.0)
TSH: 1.52 u[IU]/mL (ref 0.450–4.500)

## 2019-02-13 NOTE — Telephone Encounter (Signed)
Labs were put in for patient on yesterday from [facility] can you please follow up with patient.

## 2019-02-16 NOTE — Telephone Encounter (Signed)
LMOM

## 2019-02-17 ENCOUNTER — Other Ambulatory Visit: Payer: Self-pay | Admitting: Family Medicine

## 2019-02-17 DIAGNOSIS — Z8719 Personal history of other diseases of the digestive system: Secondary | ICD-10-CM

## 2019-02-17 DIAGNOSIS — Z860100 Personal history of colon polyps, unspecified: Secondary | ICD-10-CM

## 2019-02-17 DIAGNOSIS — D649 Anemia, unspecified: Secondary | ICD-10-CM

## 2019-02-17 NOTE — Progress Notes (Signed)
Patient ID: Deborah Coffey, female   DOB: 1955-11-30, 63 y.o.   MRN: [id number]   Patient had recent CBC and hemoglobin was 9.4.  She has a history of colon polyps and last colonoscopy was 2018.  She is also on chronic aspirin therapy and may additionally need EGD therefore referral will be made to GI in follow-up.  Patient with a EGD in 2018 showing duodenal ulcers without bleeding.  We will also refer to hematology to see if there may be any other causes of her normocytic anemia.

## 2019-02-20 ENCOUNTER — Telehealth: Payer: Self-pay | Admitting: Hematology and Oncology

## 2019-02-20 NOTE — Telephone Encounter (Signed)
Confirmed 11/11 new patient visit with patient.

## 2019-02-21 ENCOUNTER — Ambulatory Visit: Payer: Medicare HMO

## 2019-02-21 ENCOUNTER — Other Ambulatory Visit: Payer: Self-pay

## 2019-02-21 ENCOUNTER — Ambulatory Visit: Payer: Medicare HMO | Admitting: Family Medicine

## 2019-02-28 ENCOUNTER — Other Ambulatory Visit: Payer: Medicare HMO

## 2019-02-28 ENCOUNTER — Encounter: Payer: Medicare HMO | Admitting: Hematology and Oncology

## 2019-03-12 DIAGNOSIS — E113512 Type 2 diabetes mellitus with proliferative diabetic retinopathy with macular edema, left eye: Secondary | ICD-10-CM | POA: Diagnosis not present

## 2019-03-18 ENCOUNTER — Encounter: Payer: Self-pay | Admitting: Family Medicine

## 2019-05-04 DIAGNOSIS — E113511 Type 2 diabetes mellitus with proliferative diabetic retinopathy with macular edema, right eye: Secondary | ICD-10-CM | POA: Diagnosis not present

## 2019-05-28 ENCOUNTER — Ambulatory Visit: Payer: Medicare HMO | Admitting: Family Medicine

## 2019-06-08 ENCOUNTER — Other Ambulatory Visit: Payer: Self-pay | Admitting: Gastroenterology

## 2019-06-08 DIAGNOSIS — R140 Abdominal distension (gaseous): Secondary | ICD-10-CM | POA: Diagnosis not present

## 2019-06-08 DIAGNOSIS — R1314 Dysphagia, pharyngoesophageal phase: Secondary | ICD-10-CM

## 2019-06-08 DIAGNOSIS — R6881 Early satiety: Secondary | ICD-10-CM | POA: Diagnosis not present

## 2019-06-08 DIAGNOSIS — Z8619 Personal history of other infectious and parasitic diseases: Secondary | ICD-10-CM | POA: Diagnosis not present

## 2019-06-12 ENCOUNTER — Ambulatory Visit
Admission: RE | Admit: 2019-06-12 | Discharge: 2019-06-12 | Disposition: A | Discharge status: Home / Self Care | Payer: Medicare HMO | Source: Ambulatory Visit | Attending: Gastroenterology | Admitting: Gastroenterology

## 2019-06-12 DIAGNOSIS — R1314 Dysphagia, pharyngoesophageal phase: Secondary | ICD-10-CM

## 2019-06-13 ENCOUNTER — Encounter: Payer: Self-pay | Admitting: Family Medicine

## 2019-06-13 ENCOUNTER — Other Ambulatory Visit: Payer: Self-pay

## 2019-06-13 ENCOUNTER — Ambulatory Visit (HOSPITAL_BASED_OUTPATIENT_CLINIC_OR_DEPARTMENT_OTHER): Payer: Medicare HMO | Admitting: Pharmacist

## 2019-06-13 ENCOUNTER — Ambulatory Visit: Payer: Medicare HMO | Attending: Family Medicine | Admitting: Family Medicine

## 2019-06-13 VITALS — BP 152/74 | HR 85 | Temp 97.5°F | Ht 66.0 in | Wt 257.0 lb

## 2019-06-13 DIAGNOSIS — E785 Hyperlipidemia, unspecified: Secondary | ICD-10-CM | POA: Diagnosis not present

## 2019-06-13 DIAGNOSIS — I10 Essential (primary) hypertension: Secondary | ICD-10-CM

## 2019-06-13 DIAGNOSIS — E559 Vitamin D deficiency, unspecified: Secondary | ICD-10-CM | POA: Diagnosis not present

## 2019-06-13 DIAGNOSIS — Z794 Long term (current) use of insulin: Secondary | ICD-10-CM

## 2019-06-13 DIAGNOSIS — D649 Anemia, unspecified: Secondary | ICD-10-CM

## 2019-06-13 DIAGNOSIS — E1142 Type 2 diabetes mellitus with diabetic polyneuropathy: Secondary | ICD-10-CM | POA: Diagnosis not present

## 2019-06-13 DIAGNOSIS — I2510 Atherosclerotic heart disease of native coronary artery without angina pectoris: Secondary | ICD-10-CM

## 2019-06-13 DIAGNOSIS — E1165 Type 2 diabetes mellitus with hyperglycemia: Secondary | ICD-10-CM

## 2019-06-13 DIAGNOSIS — Z79899 Other long term (current) drug therapy: Secondary | ICD-10-CM

## 2019-06-13 DIAGNOSIS — E1169 Type 2 diabetes mellitus with other specified complication: Secondary | ICD-10-CM | POA: Diagnosis not present

## 2019-06-13 DIAGNOSIS — I2583 Coronary atherosclerosis due to lipid rich plaque: Secondary | ICD-10-CM

## 2019-06-13 DIAGNOSIS — Z23 Encounter for immunization: Secondary | ICD-10-CM

## 2019-06-13 DIAGNOSIS — R5383 Other fatigue: Secondary | ICD-10-CM | POA: Diagnosis not present

## 2019-06-13 DIAGNOSIS — Z6841 Body Mass Index (BMI) 40.0 and over, adult: Secondary | ICD-10-CM | POA: Diagnosis not present

## 2019-06-13 LAB — GLUCOSE, POCT (MANUAL RESULT ENTRY): POC Glucose: 191 mg/dl — AB (ref 70–99)

## 2019-06-13 LAB — POCT GLYCOSYLATED HEMOGLOBIN (HGB A1C): HbA1c, POC (controlled diabetic range): 10.1 % — AB (ref 0.0–7.0)

## 2019-06-13 MED ORDER — METFORMIN HCL ER 500 MG PO TB24: 1000.0000 mg | ORAL_TABLET | Freq: Two times a day (BID) | ORAL | 1 refills | Status: DC

## 2019-06-13 MED ORDER — PANTOPRAZOLE SODIUM 40 MG PO TBEC: 40.0000 mg | DELAYED_RELEASE_TABLET | Freq: Two times a day (BID) | ORAL | 1 refills | Status: DC

## 2019-06-13 MED ORDER — TOUJEO SOLOSTAR 300 UNIT/ML ~~LOC~~ SOPN: 55.0000 [IU]/d | PEN_INJECTOR | Freq: Every day | SUBCUTANEOUS | 6 refills | Status: DC

## 2019-06-13 MED ORDER — VICTOZA 18 MG/3ML ~~LOC~~ SOPN: PEN_INJECTOR | SUBCUTANEOUS | 3 refills | Status: DC

## 2019-06-13 MED ORDER — LISINOPRIL 10 MG PO TABS: 10.0000 mg | ORAL_TABLET | Freq: Every day | ORAL | 1 refills | Status: DC

## 2019-06-13 MED ORDER — FERROUS SULFATE 325 (65 FE) MG PO TBEC: 325.0000 mg | DELAYED_RELEASE_TABLET | Freq: Two times a day (BID) | ORAL | 3 refills | Status: DC

## 2019-06-13 MED ORDER — METOPROLOL TARTRATE 50 MG PO TABS: ORAL_TABLET | ORAL | 1 refills | Status: DC

## 2019-06-13 MED ORDER — GABAPENTIN 300 MG PO CAPS: ORAL_CAPSULE | ORAL | 1 refills | Status: DC

## 2019-06-13 MED ORDER — HYDROCHLOROTHIAZIDE 25 MG PO TABS: 25.0000 mg | ORAL_TABLET | Freq: Every day | ORAL | 3 refills | Status: DC

## 2019-06-13 NOTE — Patient Instructions (Signed)

## 2019-06-13 NOTE — Progress Notes (Signed)
Would like to get the shingles vaccine.

## 2019-06-13 NOTE — Progress Notes (Signed)
Subjective:  Patient ID: Deborah Coffey, female    DOB: [birth year]  Age: 64 y.o. MRN: [id number]  CC: Diabetes   HPI Deborah Coffey  is a 64 year female with a history of Type 2 DM (A1c 10.6), Diabetic neuropathy, Hypertension, CAD (s/p CABG) here for a follow-up visit.  Aic is 10.6 which is up from 7.8 previously and she endorses compliance with her Lantus dose but of note has gained 17 pounds in the last 7 months.  She is not very active.  Her neuropathy is uncontrolled and she complains that despite increasing gabapentin dose by her previous PCP symptoms still persist. Her feet tighten up and she has pain; she also has cramps in her legs.when she wakes up in the mornings and tries to move around. She has had insomnia for the last 2-3 weeks; she takes daytime naps and finds herself awake at night.  After her visit with Dr. Chapman Fitch in 01/2019 labs had revealed anemia for which she was referred to hematology but she never followed through with appointment.  She was also referred to GI and she informs me she has an upcoming endoscopy within the next 1 to 2 weeks. Her colonoscopy from 11/2016 revealed presence of polyps and diverticula; pathology revealed sessile serrated polyp and hyperplastic polyps.  She does endorse intermittent black stools and is followed by [facility].  Past Medical History:  Diagnosis Date  . Allergy   . Anemia   . CAD in native artery 02/2004   Mid LAD lesoin just after Large D1 --> CABG x 1 LIMA-LAD  . CAP (community acquired pneumonia) 08/07/2014  . Chronic bronchitis ([place])    "I think I get it q yr" (08/07/2014)  . Depression   . High cholesterol   . History of blood transfusion 2005   "related to OHS"   . Hypertension   . 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 competitive flow  . Type II diabetes mellitus ([place])     Past Surgical History:  Procedure Laterality Date  . CARDIAC  CATHETERIZATION  2010   Previous LAD 95% lesion - now ~30-40%; patent LIMA with competitive flow  . CESAREAN SECTION  1977; 1989  . CHOLECYSTECTOMY  ~ 2012  . CORONARY ARTERY BYPASS GRAFT  02/2004   "CABG X1" (03/22/2013); LIMA-LAD  . TUBAL LIGATION  1989    Family History  Problem Relation Age of Onset  . Heart disease Mother   . Diabetes Mother   . Cancer Mother        cervical  . Diabetes Father   . Diabetes Sister   . Heart disease Sister   . Diabetes Brother   . Heart attack Brother   . Lupus Sister   . Heart failure Sister   . Hepatitis C Sister   . Heart failure Brother   . Diabetes Brother   . Hypertension Daughter   . Colon cancer Neg Hx     Allergies  Allergen Reactions  . Ciprofloxacin Rash    Outpatient Medications Prior to Visit  Medication Sig Dispense Refill  . ACCU-CHEK FASTCLIX LANCETS MISC Use as directed to check blood glucose 3 times daily. E11.9 100 each 5  . acetaminophen (TYLENOL) 500 MG tablet Take 1,000 mg by mouth every 6 (six) hours as needed for headache (pain).     . Alcohol Swabs (B-D SINGLE USE SWABS REGULAR) PADS USE AS DIRECTED 100 each 0  .  aspirin EC 81 MG tablet Take 81 mg by mouth daily.    Marland Kitchen atorvastatin (LIPITOR) 10 MG tablet Take 1 tablet (10 mg total) by mouth daily. 90 tablet 1  . Blood Glucose Monitoring Suppl (ACCU-CHEK NANO SMARTVIEW) w/Device KIT Use as directed to check blood glucose 3 times daily. E11.9 1 kit 0  . clobetasol cream (TEMOVATE) 0.05 % Apply topically 2 (two) times daily. 60 g 0  . diclofenac sodium (VOLTAREN) 1 % GEL APPLY 4 GRAMS TOPICALLY FOUR TIMES DAILY 400 g 0  . furosemide (LASIX) 20 MG tablet Take 1 tablet (20 mg total) by mouth daily. 90 tablet 1  . gabapentin (NEURONTIN) 300 MG capsule Take 2 capsules (600 mg total) by mouth 2 (two) times daily. 360 capsule 1  . glucose blood (ACCU-CHEK SMARTVIEW) test strip Use as instructed to test blood glucose 3 times daily. E11.9 100 each 12  .  hydrochlorothiazide (MICROZIDE) 12.5 MG capsule Take 1 capsule (12.5 mg total) by mouth daily. 90 capsule 1  . Insulin Pen Needle 31G X 8 MM MISC Use to inject Toujeo once daily. 100 each 4  . lisinopril (ZESTRIL) 10 MG tablet Take 1 tablet (10 mg total) by mouth daily. 90 tablet 1  . metFORMIN (GLUCOPHAGE-XR) 500 MG 24 hr tablet Take 2 tablets (1,000 mg total) by mouth 2 (two) times daily. 360 tablet 1  . metoprolol tartrate (LOPRESSOR) 50 MG tablet TAKE 1 TABLET TWICE DAILY 180 tablet 1  . Multiple Vitamin (MULTIVITAMIN WITH MINERALS) TABS tablet Take 1 tablet by mouth daily.    . nitroGLYCERIN (NITROSTAT) 0.4 MG SL tablet Place 1 tablet (0.4 mg total) under the tongue every 5 (five) minutes as needed for chest pain. 25 tablet 3  . pantoprazole (PROTONIX) 40 MG tablet Take 1 tablet (40 mg total) by mouth 2 (two) times daily. 180 tablet 1  . Insulin Glargine, 1 Unit Dial, (TOUJEO SOLOSTAR) 300 UNIT/ML SOPN Inject 55 Units/day into the skin daily. 5.5 mL 0   No facility-administered medications prior to visit.     ROS Review of Systems  Constitutional: Positive for fatigue. Negative for activity change and appetite change.  HENT: Negative for congestion, sinus pressure and sore throat.   Eyes: Negative for visual disturbance.  Respiratory: Negative for cough, chest tightness, shortness of breath and wheezing.   Cardiovascular: Negative for chest pain and palpitations.  Gastrointestinal: Negative for abdominal distention, abdominal pain and constipation.  Endocrine: Negative for polydipsia.  Genitourinary: Negative for dysuria and frequency.  Musculoskeletal: Negative for arthralgias and back pain.  Skin: Negative for rash.  Neurological: Positive for numbness. Negative for tremors and light-headedness.  Hematological: Does not bruise/bleed easily.  Psychiatric/Behavioral: Negative for agitation and behavioral problems.    Objective:  BP (!) 152/74   Pulse 85   Temp (!) 97.5 F (36.4  C) (Temporal)   Ht _0  (1.676 m)   Wt 257 lb (116.6 kg)   LMP  (LMP Unknown)   SpO2 97%   BMI 41.48 kg/m   BP/Weight 06/13/2019 11/01/2018 69/48/5462  Systolic BP 703 - 500  Diastolic BP 74 - 77  Wt. (Lbs) 257 240 246  BMI 41.48 38.74 39.71      Physical Exam Constitutional:      Appearance: She is well-developed. She is obese.  Neck:     Vascular: No JVD.  Cardiovascular:     Rate and Rhythm: Normal rate.     Heart sounds: Normal heart sounds. No murmur.  Pulmonary:  Effort: Pulmonary effort is normal.     Breath sounds: Normal breath sounds. No wheezing or rales.  Chest:     Chest wall: No tenderness.  Abdominal:     General: Bowel sounds are normal. There is no distension.     Palpations: Abdomen is soft. There is no mass.     Tenderness: There is no abdominal tenderness.  Musculoskeletal:        General: Normal range of motion.     Right lower leg: No edema.     Left lower leg: No edema.  Neurological:     Mental Status: She is alert and oriented to person, place, and time.  Psychiatric:        Mood and Affect: Mood normal.     CMP Latest Ref Rng & Units 02/12/2019 02/10/2018 09/17/2016  Glucose 65 - 99 mg/dL 375(H) 53(L) 264(H)  BUN 8 - 27 mg/dL _0 Creatinine 0.57 - 1.00 mg/dL 1.04(H) 1.13(H) 1.07(H)  Sodium 134 - 144 mmol/L 141 145(H) 138  Potassium 3.5 - 5.2 mmol/L 4.4 4.7 4.3  Chloride 96 - 106 mmol/L 105 107(H) 106  CO2 20 - 29 mmol/L _1 Calcium 8.7 - 10.3 mg/dL 9.6 9.6 9.1  Total Protein 6.0 - 8.5 g/dL - 6.9 -  Total Bilirubin 0.0 - 1.2 mg/dL - <0.2 -  Alkaline Phos 39 - 117 IU/L - 89 -  AST 0 - 40 IU/L - 14 -  ALT 0 - 32 IU/L - 13 -    Lipid Panel     Component Value Date/Time   CHOL 141 02/12/2019 1015   TRIG 126 02/12/2019 1015   HDL 68 02/12/2019 1015   CHOLHDL 2.1 02/12/2019 1015   CHOLHDL 2.2 09/02/2016 0533   VLDL 13 09/02/2016 0533   LDLCALC 51 02/12/2019 1015    CBC    Component Value Date/Time   WBC 6.2  02/12/2019 1015   WBC 8.2 09/17/2016 1459   RBC 3.71 (L) 02/12/2019 1015   RBC 3.96 09/17/2016 1459   HGB 9.4 (L) 02/12/2019 1015   HCT 30.8 (L) 02/12/2019 1015   PLT 309 02/12/2019 1015   MCV 83 02/12/2019 1015   MCH 25.3 (L) 02/12/2019 1015   MCH 24.2 (L) 09/17/2016 1459   MCHC 30.5 (L) 02/12/2019 1015   MCHC 30.9 09/17/2016 1459   RDW 16.6 (H) 02/12/2019 1015   LYMPHSABS 3.3 09/02/2016 0038   MONOABS 0.6 09/02/2016 0038   EOSABS 0.2 09/02/2016 0038   BASOSABS 0.0 09/02/2016 0038   Lab Results  Component Value Date   HGBA1C 10.1 (A) 06/13/2019     Assessment & Plan:  1. Type 2 diabetes mellitus with hyperglycemia, with long-term current use of insulin (HCC) Uncontrolled with A1c of 10.1 which is up from 7.8 previously; goal is less than 7 We will commence Victoza Clinical pharmacist called in to perform education on Victoza titration and she will follow-up with him weekly to ensure adherence.  This will also be beneficial with regards to weight loss and cardiovascular benefits Counseled on Diabetic diet, my plate method, 338 minutes of moderate intensity exercise/week Blood sugar logs with fasting goals of 80-120 mg/dl, random of less than 180 and in the event of sugars less than 60 mg/dl or greater than 400 mg/dl encouraged to notify the clinic. Advised on the need for annual eye exams, annual foot exams, Pneumonia vaccine. - POCT glucose (manual entry) - POCT glycosylated hemoglobin (Hb A1C) - liraglutide (VICTOZA)  18 MG/3ML SOPN; Start 0.61m SQ once a day for 7 days, then increase to 1.260monce a day for 7 days then 1.8 mg once daily thereafter  Dispense: 3 pen; Refill: 3 - Basic Metabolic Panel  2. Normocytic anemia Last hemoglobin was 9.4 in 01/2019 Referred to hematology but she never followed through with appointment Colonoscopy in 2018 was negative for malignancy We will send off CBC again and work-up if still anemic Commence ferrous sulfate and advised to use  laxative as well due to constipating adverse effect. - ferrous sulfate 325 (65 FE) MG EC tablet; Take 1 tablet (325 mg total) by mouth 2 (two) times daily.  Dispense: 60 tablet; Refill: 3  3. Essential hypertension Uncontrolled Increase hydrochlorothiazide dose Insomnia could also be contributory -advised to avoid daytime naps Counseled on blood pressure goal of less than 130/80, low-sodium, DASH diet, medication compliance, 150 minutes of moderate intensity exercise per week. Discussed medication compliance, adverse effects. - hydrochlorothiazide (HYDRODIURIL) 25 MG tablet; Take 1 tablet (25 mg total) by mouth daily.  Dispense: 90 tablet; Refill: 3 - pantoprazole (PROTONIX) 40 MG tablet; Take 1 tablet (40 mg total) by mouth 2 (two) times daily.  Dispense: 180 tablet; Refill: 1 - metoprolol tartrate (LOPRESSOR) 50 MG tablet; TAKE 1 TABLET TWICE DAILY  Dispense: 180 tablet; Refill: 1 - lisinopril (ZESTRIL) 10 MG tablet; Take 1 tablet (10 mg total) by mouth daily.  Dispense: 90 tablet; Refill: 1 - Lipid panel  4. Hyperlipidemia associated with type 2 diabetes mellitus (HCC) Controlled Continue Lipitor Counseled on low-cholesterol diet - metFORMIN (GLUCOPHAGE-XR) 500 MG 24 hr tablet; Take 2 tablets (1,000 mg total) by mouth 2 (two) times daily.  Dispense: 360 tablet; Refill: 1 - Insulin Glargine, 1 Unit Dial, (TOUJEO SOLOSTAR) 300 UNIT/ML SOPN; Inject 55 Units/day into the skin daily.  Dispense: 30 mL; Refill: 6 - gabapentin (NEURONTIN) 300 MG capsule; Take orally 2 caps (600 mg) in the morning and 3 capsules (900 mg) in the evening  Dispense: 450 capsule; Refill: 1  5. Diabetic polyneuropathy associated with type 2 diabetes mellitus (HCC) Uncontrolled Increase nighttime dose of gabapentin If symptoms persist, consider Lyrica - gabapentin (NEURONTIN) 300 MG capsule; Take orally 2 caps (600 mg) in the morning and 3 capsules (900 mg) in the evening  Dispense: 450 capsule; Refill: 1  6. Other  fatigue Thyroid panel in 01/2019 was negative We will send a vitamin D level Anemia could explain symptoms - VITAMIN D 25 Hydroxy (Vit-D Deficiency, Fractures)  7. Coronary artery disease due to lipid rich plaque Status post CABG Asymptomatic Risk factor modification   Health Care Maintenance: Due for mammogram in 01/2020, will perform Pap at that time No orders of the defined types were placed in this encounter.   Follow-up: Return for shingrix with LuLurena Joiner3 months PCP.     EnCharlott RakesMD, FAAFP. [facility] [place] 06/13/2019, 9:47 AM

## 2019-06-13 NOTE — Progress Notes (Signed)
Patient was educated on the use of the Victoza pen. Reviewed necessary supplies and operation of the pen. Also reviewed goal blood glucose levels. Patient was able to demonstrate use. All questions and concerns were addressed.  Patient presents for vaccination against Zoster per orders of Dr. Margarita Rana. Consent given. Counseling provided. No contraindications exists. Vaccine administered without incident. Plan for second dose in two months.   Benard Halsted, PharmD, [place] [phone]

## 2019-06-14 ENCOUNTER — Other Ambulatory Visit: Payer: Self-pay | Admitting: Family Medicine

## 2019-06-14 DIAGNOSIS — E1165 Type 2 diabetes mellitus with hyperglycemia: Secondary | ICD-10-CM

## 2019-06-14 DIAGNOSIS — I10 Essential (primary) hypertension: Secondary | ICD-10-CM

## 2019-06-14 DIAGNOSIS — E1169 Type 2 diabetes mellitus with other specified complication: Secondary | ICD-10-CM

## 2019-06-14 LAB — BASIC METABOLIC PANEL
BUN/Creatinine Ratio: 14 (ref 12–28)
BUN: 15 mg/dL (ref 8–27)
CO2: 22 mmol/L (ref 20–29)
Calcium: 9.8 mg/dL (ref 8.7–10.3)
Chloride: 101 mmol/L (ref 96–106)
Creatinine, Ser: 1.11 mg/dL — ABNORMAL HIGH (ref 0.57–1.00)
GFR calc Af Amer: 62 mL/min/{1.73_m2} (ref >59)
GFR calc non Af Amer: 53 mL/min/{1.73_m2} — ABNORMAL LOW (ref >59)
Glucose: 232 mg/dL — ABNORMAL HIGH (ref 65–99)
Potassium: 4.6 mmol/L (ref 3.5–5.2)
Sodium: 138 mmol/L (ref 134–144)

## 2019-06-14 LAB — LIPID PANEL
Chol/HDL Ratio: 2.3 ratio (ref 0.0–4.4)
Cholesterol, Total: 150 mg/dL (ref 100–199)
HDL: 66 mg/dL (ref >39)
LDL Chol Calc (NIH): 64 mg/dL (ref 0–99)
Triglycerides: 111 mg/dL (ref 0–149)
VLDL Cholesterol Cal: 20 mg/dL (ref 5–40)

## 2019-06-14 LAB — VITAMIN D 25 HYDROXY (VIT D DEFICIENCY, FRACTURES): Vit D, 25-Hydroxy: 20.2 ng/mL — ABNORMAL LOW (ref 30.0–100.0)

## 2019-06-15 ENCOUNTER — Other Ambulatory Visit: Payer: Self-pay | Admitting: Family Medicine

## 2019-06-15 MED ORDER — ERGOCALCIFEROL 1.25 MG (50000 UT) PO CAPS: 50000.0000 [IU] | ORAL_CAPSULE | ORAL | 0 refills | Status: DC

## 2019-06-19 ENCOUNTER — Other Ambulatory Visit: Payer: Self-pay | Admitting: Gastroenterology

## 2019-06-19 ENCOUNTER — Ambulatory Visit
Admission: RE | Admit: 2019-06-19 | Discharge: 2019-06-19 | Disposition: A | Discharge status: Home / Self Care | Payer: Medicare HMO | Source: Ambulatory Visit | Attending: Gastroenterology | Admitting: Gastroenterology

## 2019-06-19 DIAGNOSIS — R1314 Dysphagia, pharyngoesophageal phase: Secondary | ICD-10-CM

## 2019-06-19 DIAGNOSIS — K224 Dyskinesia of esophagus: Secondary | ICD-10-CM | POA: Diagnosis not present

## 2019-06-20 DIAGNOSIS — Z8619 Personal history of other infectious and parasitic diseases: Secondary | ICD-10-CM | POA: Diagnosis not present

## 2019-06-20 DIAGNOSIS — R6881 Early satiety: Secondary | ICD-10-CM | POA: Diagnosis not present

## 2019-06-20 DIAGNOSIS — R140 Abdominal distension (gaseous): Secondary | ICD-10-CM | POA: Diagnosis not present

## 2019-06-25 DIAGNOSIS — H25813 Combined forms of age-related cataract, bilateral: Secondary | ICD-10-CM | POA: Diagnosis not present

## 2019-06-25 DIAGNOSIS — E113552 Type 2 diabetes mellitus with stable proliferative diabetic retinopathy, left eye: Secondary | ICD-10-CM | POA: Diagnosis not present

## 2019-06-25 DIAGNOSIS — H16222 Keratoconjunctivitis sicca, not specified as Sjogren's, left eye: Secondary | ICD-10-CM | POA: Diagnosis not present

## 2019-06-25 DIAGNOSIS — E113491 Type 2 diabetes mellitus with severe nonproliferative diabetic retinopathy without macular edema, right eye: Secondary | ICD-10-CM | POA: Diagnosis not present

## 2019-06-25 DIAGNOSIS — H40033 Anatomical narrow angle, bilateral: Secondary | ICD-10-CM | POA: Diagnosis not present

## 2019-06-25 DIAGNOSIS — Z794 Long term (current) use of insulin: Secondary | ICD-10-CM | POA: Diagnosis not present

## 2019-07-03 DIAGNOSIS — Z86010 Personal history of colonic polyps: Secondary | ICD-10-CM | POA: Diagnosis not present

## 2019-07-03 DIAGNOSIS — K224 Dyskinesia of esophagus: Secondary | ICD-10-CM | POA: Diagnosis not present

## 2019-07-03 DIAGNOSIS — Z8619 Personal history of other infectious and parasitic diseases: Secondary | ICD-10-CM | POA: Diagnosis not present

## 2019-08-01 DIAGNOSIS — E113511 Type 2 diabetes mellitus with proliferative diabetic retinopathy with macular edema, right eye: Secondary | ICD-10-CM | POA: Diagnosis not present

## 2019-08-08 DIAGNOSIS — E113512 Type 2 diabetes mellitus with proliferative diabetic retinopathy with macular edema, left eye: Secondary | ICD-10-CM | POA: Diagnosis not present

## 2019-08-13 ENCOUNTER — Other Ambulatory Visit: Payer: Medicare HMO | Admitting: Pharmacist

## 2019-08-13 ENCOUNTER — Ambulatory Visit: Payer: Medicare HMO | Admitting: Pharmacist

## 2019-08-20 DIAGNOSIS — H25813 Combined forms of age-related cataract, bilateral: Secondary | ICD-10-CM | POA: Diagnosis not present

## 2019-08-20 DIAGNOSIS — E113513 Type 2 diabetes mellitus with proliferative diabetic retinopathy with macular edema, bilateral: Secondary | ICD-10-CM | POA: Diagnosis not present

## 2019-08-20 DIAGNOSIS — H3582 Retinal ischemia: Secondary | ICD-10-CM | POA: Diagnosis not present

## 2019-08-29 DIAGNOSIS — E113512 Type 2 diabetes mellitus with proliferative diabetic retinopathy with macular edema, left eye: Secondary | ICD-10-CM | POA: Diagnosis not present

## 2019-08-29 DIAGNOSIS — H25813 Combined forms of age-related cataract, bilateral: Secondary | ICD-10-CM | POA: Diagnosis not present

## 2019-09-04 ENCOUNTER — Ambulatory Visit: Payer: Medicare HMO | Admitting: Family Medicine

## 2019-09-18 ENCOUNTER — Ambulatory Visit: Payer: Medicare HMO | Admitting: Family Medicine

## 2019-09-18 DIAGNOSIS — H2511 Age-related nuclear cataract, right eye: Secondary | ICD-10-CM | POA: Diagnosis not present

## 2019-09-18 DIAGNOSIS — H25811 Combined forms of age-related cataract, right eye: Secondary | ICD-10-CM | POA: Diagnosis not present

## 2019-09-19 ENCOUNTER — Ambulatory Visit: Payer: Medicare HMO | Admitting: Family Medicine

## 2019-09-19 ENCOUNTER — Encounter (HOSPITAL_COMMUNITY): Payer: Self-pay | Admitting: Emergency Medicine

## 2019-09-19 ENCOUNTER — Emergency Department (HOSPITAL_COMMUNITY)
Admission: EM | Admit: 2019-09-19 | Discharge: 2019-09-19 | Disposition: A | Discharge status: Home / Self Care | Payer: Medicare HMO | Attending: Emergency Medicine | Admitting: Emergency Medicine

## 2019-09-19 ENCOUNTER — Other Ambulatory Visit: Payer: Self-pay

## 2019-09-19 DIAGNOSIS — I2510 Atherosclerotic heart disease of native coronary artery without angina pectoris: Secondary | ICD-10-CM | POA: Insufficient documentation

## 2019-09-19 DIAGNOSIS — M545 Low back pain: Secondary | ICD-10-CM | POA: Diagnosis present

## 2019-09-19 DIAGNOSIS — F17210 Nicotine dependence, cigarettes, uncomplicated: Secondary | ICD-10-CM | POA: Insufficient documentation

## 2019-09-19 DIAGNOSIS — I110 Hypertensive heart disease with heart failure: Secondary | ICD-10-CM | POA: Insufficient documentation

## 2019-09-19 DIAGNOSIS — E119 Type 2 diabetes mellitus without complications: Secondary | ICD-10-CM | POA: Diagnosis not present

## 2019-09-19 DIAGNOSIS — I5032 Chronic diastolic (congestive) heart failure: Secondary | ICD-10-CM | POA: Diagnosis not present

## 2019-09-19 DIAGNOSIS — Z794 Long term (current) use of insulin: Secondary | ICD-10-CM | POA: Insufficient documentation

## 2019-09-19 DIAGNOSIS — Z7982 Long term (current) use of aspirin: Secondary | ICD-10-CM | POA: Diagnosis not present

## 2019-09-19 DIAGNOSIS — Z79899 Other long term (current) drug therapy: Secondary | ICD-10-CM | POA: Diagnosis not present

## 2019-09-19 DIAGNOSIS — M5432 Sciatica, left side: Secondary | ICD-10-CM

## 2019-09-19 DIAGNOSIS — Z951 Presence of aortocoronary bypass graft: Secondary | ICD-10-CM | POA: Insufficient documentation

## 2019-09-19 MED ORDER — CYCLOBENZAPRINE HCL 10 MG PO TABS
10.0000 mg | ORAL_TABLET | Freq: Two times a day (BID) | ORAL | 0 refills | Status: DC | PRN
Start: 2019-09-19 — End: 2022-02-03

## 2019-09-19 MED ORDER — IBUPROFEN 600 MG PO TABS: 600.0000 mg | ORAL_TABLET | Freq: Four times a day (QID) | ORAL | 0 refills | Status: DC | PRN

## 2019-09-19 MED ORDER — CYCLOBENZAPRINE HCL 10 MG PO TABS
10.0000 mg | ORAL_TABLET | Freq: Once | ORAL | Status: AC
  Administered 2019-09-19: 10 mg via ORAL
  Filled 2019-09-19: qty 1

## 2019-09-19 MED ORDER — PREDNISONE 20 MG PO TABS
ORAL_TABLET | ORAL | 0 refills | Status: DC
Start: 2019-09-19 — End: 2019-11-01

## 2019-09-19 MED ORDER — IBUPROFEN 800 MG PO TABS
800.0000 mg | ORAL_TABLET | Freq: Once | ORAL | Status: DC
  Filled 2019-09-19: qty 1

## 2019-09-19 MED FILL — OFLOXACIN 0.3% EYE DROPS: 0.3 | 18 days supply | Qty: 5 | Fill #0

## 2019-09-19 MED FILL — KETOROLAC 0.5% OPHTH SOLN: 0.5 | 50 days supply | Qty: 10 | Fill #0

## 2019-09-19 MED FILL — PREDNISOLONE AC 1% EYE DROP: 1 | 21 days supply | Qty: 10 | Fill #0

## 2019-09-19 NOTE — ED Provider Notes (Signed)
[facility] EMERGENCY DEPARTMENT Provider Note   CSN: [id number] Arrival date & time: 09/19/19  1722     History Chief Complaint  Patient presents with  . Hip Pain  . Leg Pain    Deborah Coffey is a 64 y.o. female.  The history is provided by the patient. No language interpreter was used.  Hip Pain  Leg Pain Associated symptoms: back pain   Associated symptoms: no fever      64 year old female with history of CAD, diabetes, hypercholesterolemia, CHF presenting complaining of leg pain.  Patient developed pain to her left lower back that radiates down her left leg started earlier today.  Pain is described as a sharp shooting sensation, worse with ambulation and with leg movement and mildly improved with sitting.  Pain is moderate in severity, rates as 10 out of 10 and acute in onset.  No associated fever or chills no abdominal pain dysuria hematuria bowel bladder incontinence or saddle anesthesia.  No recent injury or heavy lifting.  No treatment tried.  No prior history of DVT.  Denies any numbness weakness.  Past Medical History:  Diagnosis Date  . Allergy   . Anemia   . CAD in native artery 02/2004   Mid LAD lesoin just after Large D1 --> CABG x 1 LIMA-LAD  . CAP (community acquired pneumonia) 08/07/2014  . Chronic bronchitis ([place])    "I think I get it q yr" (08/07/2014)  . Depression   . High cholesterol   . History of blood transfusion 2005   "related to OHS"   . Hypertension   . 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 competitive flow  . Type II diabetes mellitus [facility])     Patient Active Problem List   Diagnosis Date Noted  . Facial numbness 09/03/2016  . Chronic diastolic CHF (congestive heart failure) ([place]) 09/03/2016  . Anxiety   . Eczema 01/29/2016  . History of adenomatous polyp of colon 10/01/2015  . Floaters in visual field 09/04/2015  . SOB (shortness of breath) 06/26/2015  . Screening  for colon cancer 06/26/2015  . Type 2 diabetes mellitus with complication, with long-term current use of insulin ([place]) 02/27/2015  . Diabetic mononeuropathy associated with type 2 diabetes mellitus ([place]) 02/27/2015  . LLQ abdominal pain   . CAP (community acquired pneumonia) 08/07/2014  . AKI (acute kidney injury) ([place]) 08/07/2014  . Type 2 diabetes mellitus with hyperglycemia ([place]) 07/08/2014  . Preventative health care 07/08/2014  . Depression 07/08/2014  . Acute upper respiratory infection 07/08/2014  . Smoking addiction 07/08/2014  . Essential hypertension 01/10/2014  . Depression (emotion) 01/10/2014  . H/O TIA (transient ischemic attack) and stroke 01/10/2014  . Visual changes 01/02/2014  . Facial weakness 01/02/2014  . Conversion disorder 01/01/2014  . Tobacco use 06/20/2013  . Diabetic retinopathy ([place]) 04/03/2013  . Atypical chest pain 03/22/2013  . HTN (hypertension) 03/22/2013  . Essential hypertension, benign 02/14/2013  . Accelerated hypertension 09/19/2012  . Uncontrolled diabetes mellitus ([place]) 09/19/2012  . Dyslipidemia 09/19/2012  . CAD (coronary artery disease) - CABG x 1 (LIMA-LAD) 02/21/2004    Past Surgical History:  Procedure Laterality Date  . CARDIAC CATHETERIZATION  2010   Previous LAD 95% lesion - now ~30-40%; patent LIMA with competitive flow  . CESAREAN SECTION  1977; 1989  . CHOLECYSTECTOMY  ~ 2012  . CORONARY ARTERY BYPASS GRAFT  02/2004   "CABG X1" (03/22/2013); LIMA-LAD  .  TUBAL LIGATION  1989     OB History   No obstetric history on file.     Family History  Problem Relation Age of Onset  . Heart disease Mother   . Diabetes Mother   . Cancer Mother        cervical  . Diabetes Father   . Diabetes Sister   . Heart disease Sister   . Diabetes Brother   . Heart attack Brother   . Lupus Sister   . Heart failure Sister   . Hepatitis C Sister   . Heart failure Brother   . Diabetes Brother   . Hypertension Daughter   . Colon cancer  Neg Hx     Social History   Tobacco Use  . Smoking status: Current Some Day Smoker    Packs/day: 0.50    Years: 42.00    Pack years: 21.00    Types: Cigarettes  . Smokeless tobacco: Never Used  Substance Use Topics  . Alcohol use: Yes    Alcohol/week: 1.0 - 2.0 standard drinks    Types: 1 - 2 Glasses of wine per week    Comment: 08/07/2014 "glass of wine maybe 2 times/yr"  . Drug use: No    Types: Marijuana    Comment: smoked pot years ago but not currently    Home Medications Prior to Admission medications   Medication Sig Start Date End Date Taking? Authorizing Provider  ACCU-CHEK FASTCLIX LANCETS MISC Use as directed to check blood glucose 3 times daily. E11.9 02/08/17   Tresa Garter, MD  acetaminophen (TYLENOL) 500 MG tablet Take 1,000 mg by mouth every 6 (six) hours as needed for headache (pain).     [provider]  Alcohol Swabs (B-D SINGLE USE SWABS REGULAR) PADS USE AS DIRECTED 04/13/17   Tresa Garter, MD  aspirin EC 81 MG tablet Take 81 mg by mouth daily.    [provider]  atorvastatin (LIPITOR) 10 MG tablet TAKE 1 TABLET EVERY DAY 06/15/19   Fulp, Cammie, MD  Blood Glucose Monitoring Suppl (ACCU-CHEK NANO SMARTVIEW) w/Device KIT Use as directed to check blood glucose 3 times daily. E11.9 07/07/16   Tresa Garter, MD  clobetasol cream (TEMOVATE) 0.05 % Apply topically 2 (two) times daily. 02/09/19   Fulp, Cammie, MD  diclofenac sodium (VOLTAREN) 1 % GEL APPLY 4 GRAMS TOPICALLY FOUR TIMES DAILY 10/02/18   Charlott Rakes, MD  ergocalciferol (DRISDOL) 1.25 MG (50000 UT) capsule Take 1 capsule (50,000 Units total) by mouth once a week. 06/15/19   Charlott Rakes, MD  ferrous sulfate 325 (65 FE) MG EC tablet Take 1 tablet (325 mg total) by mouth 2 (two) times daily. 06/13/19   Charlott Rakes, MD  furosemide (LASIX) 20 MG tablet TAKE 1 TABLET EVERY DAY 06/15/19   Fulp, Cammie, MD  gabapentin (NEURONTIN) 300 MG capsule Take orally 2 caps  (600 mg) in the morning and 3 capsules (900 mg) in the evening 06/13/19   Charlott Rakes, MD  glucose blood (ACCU-CHEK SMARTVIEW) test strip Use as instructed to test blood glucose 3 times daily. E11.9 07/15/16   Tresa Garter, MD  hydrochlorothiazide (HYDRODIURIL) 25 MG tablet Take 1 tablet (25 mg total) by mouth daily. 06/13/19   Charlott Rakes, MD  Insulin Glargine, 1 Unit Dial, (TOUJEO SOLOSTAR) 300 UNIT/ML SOPN Inject 55 Units/day into the skin daily. 06/13/19 07/13/19  Charlott Rakes, MD  Insulin Pen Needle 31G X 8 MM MISC Use to inject Toujeo once daily.  12/06/18   Charlott Rakes, MD  liraglutide (VICTOZA) 18 MG/3ML SOPN Start 0.59m SQ once a day for 7 days, then increase to 1.24monce a day for 7 days then 1.8 mg once daily thereafter 06/13/19   NeCharlott RakesMD  lisinopril (ZESTRIL) 10 MG tablet TAKE 1 TABLET EVERY DAY 06/15/19   Fulp, Cammie, MD  metFORMIN (GLUCOPHAGE-XR) 500 MG 24 hr tablet TAKE 2 TABLETS (1,000 MG TOTAL) BY MOUTH 2 (TWO) TIMES DAILY. 06/15/19   Fulp, Cammie, MD  metoprolol tartrate (LOPRESSOR) 50 MG tablet TAKE 1 TABLET TWICE DAILY 06/15/19   Fulp, Cammie, MD  Multiple Vitamin (MULTIVITAMIN WITH MINERALS) TABS tablet Take 1 tablet by mouth daily.    [provider]  nitroGLYCERIN (NITROSTAT) 0.4 MG SL tablet Place 1 tablet (0.4 mg total) under the tongue every 5 (five) minutes as needed for chest pain. 03/22/17   InIsaiah SergeNP  pantoprazole (PROTONIX) 40 MG tablet TAKE 1 TABLET TWICE DAILY 06/15/19   Fulp, Cammie, MD    Allergies    Ciprofloxacin  Review of Systems   Review of Systems  Constitutional: Negative for fever.  Musculoskeletal: Positive for back pain.  Skin: Negative for rash and wound.  Neurological: Negative for numbness.    Physical Exam Updated Vital Signs BP (!) 128/57 (BP Location: Right Arm)   Pulse 80   Temp 98.3 F (36.8 C) (Oral)   Resp 16   Ht _0  (1.702 m)   Wt 111.1 kg   LMP  (LMP Unknown)   SpO2 95%   BMI  38.37 kg/m   Physical Exam Vitals and nursing note reviewed.  Constitutional:      General: She is not in acute distress.    Appearance: She is well-developed. She is obese.     Comments: Obese female sitting in bed with 1 leg on the ground in one leg up in the bed.  HENT:     Head: Atraumatic.  Eyes:     Conjunctiva/sclera: Conjunctivae normal.  Abdominal:     Palpations: Abdomen is soft.     Tenderness: There is no abdominal tenderness.  Musculoskeletal:        General: Tenderness (Tenderness to left lumbar sacral region with positive straight leg raise.  Intact dorsalis pedis pulses, left leg compartment is soft, patellar deep tendon reflex intact.) present.     Cervical back: Neck supple.  Skin:    Findings: No rash.  Neurological:     Mental Status: She is alert.     ED Results / Procedures / Treatments   Labs (all labs ordered are listed, but only abnormal results are displayed) Labs Reviewed - No data to display  EKG None  Radiology No results found.  Procedures Procedures (including critical care time)  Medications Ordered in ED Medications - No data to display  ED Course  I have reviewed the triage vital signs and the nursing notes.  Pertinent labs & imaging results that were available during my care of the patient were reviewed by me and considered in my medical decision making (see chart for details).    MDM Rules/Calculators/A&P                      BP (!) 128/57 (BP Location: Right Arm)   Pulse 80   Temp 98.3 F (36.8 C) (Oral)   Resp 16   Ht _1  (1.702 m)   Wt 111.1 kg   LMP  (LMP Unknown)  SpO2 95%   BMI 38.37 kg/m   Final Clinical Impression(s) / ED Diagnoses Final diagnoses:  Sciatica of left side    Rx / DC Orders ED Discharge Orders    None     7:29 PM Patient here with radicular left leg pain suggestive of sciatica.  No history of IV drug use or active cancer.  Symptoms not suggestive of DVT.  No's recent injury  concerning for fracture or dislocation.  She is able to ambulate with a limp.  She is neurovascularly intact.  Will provide symptomatic treatment.  Due to hx of diabetes, prednisone will be given sparingly and encourage pt to monitor her CBG carefully while on prednisone.       Domenic Moras, PA-C 09/19/19 [place], MD 09/20/19 2025

## 2019-09-19 NOTE — Discharge Instructions (Addendum)
Your leg pain is likely sciatica, a pinch nerve.  Take medications prescribed for pain.  You were prescribed prednisone, a steroid.  This medication may increase your blood sugar, therefore please monitor your blood sugar closely and adjust your medication at home as appropriate.  Discontinue prednisone if your blood sugar is high.  Follow up with your doctor for further care.

## 2019-09-19 NOTE — ED Triage Notes (Signed)
Patient arrives to ED with complaints of left hip and left leg pain starting today. Patient states that the pain radiates to her lower back and down her leg.

## 2019-09-20 MED FILL — IBUPROFEN 600 MG TABLET: 600 | 7 days supply | Qty: 30 | Fill #0

## 2019-09-20 MED FILL — CYCLOBENZAPRINE 10 MG TAB: 10 | 10 days supply | Qty: 20 | Fill #0

## 2019-09-20 MED FILL — predniSONE 20 MG TABS: 20 | 5 days supply | Qty: 11 | Fill #0

## 2019-10-04 ENCOUNTER — Ambulatory Visit: Payer: Medicare HMO | Attending: Family Medicine | Admitting: Family Medicine

## 2019-10-04 ENCOUNTER — Encounter: Payer: Self-pay | Admitting: Family Medicine

## 2019-10-04 ENCOUNTER — Other Ambulatory Visit: Payer: Self-pay

## 2019-10-04 VITALS — BP 122/74 | HR 94 | Ht 67.0 in | Wt 256.8 lb

## 2019-10-04 DIAGNOSIS — E559 Vitamin D deficiency, unspecified: Secondary | ICD-10-CM

## 2019-10-04 DIAGNOSIS — Z720 Tobacco use: Secondary | ICD-10-CM

## 2019-10-04 DIAGNOSIS — Z794 Long term (current) use of insulin: Secondary | ICD-10-CM

## 2019-10-04 DIAGNOSIS — E1165 Type 2 diabetes mellitus with hyperglycemia: Secondary | ICD-10-CM | POA: Diagnosis not present

## 2019-10-04 DIAGNOSIS — I10 Essential (primary) hypertension: Secondary | ICD-10-CM | POA: Diagnosis not present

## 2019-10-04 DIAGNOSIS — D649 Anemia, unspecified: Secondary | ICD-10-CM

## 2019-10-04 DIAGNOSIS — E1142 Type 2 diabetes mellitus with diabetic polyneuropathy: Secondary | ICD-10-CM

## 2019-10-04 DIAGNOSIS — E785 Hyperlipidemia, unspecified: Secondary | ICD-10-CM | POA: Diagnosis not present

## 2019-10-04 DIAGNOSIS — F17210 Nicotine dependence, cigarettes, uncomplicated: Secondary | ICD-10-CM | POA: Diagnosis not present

## 2019-10-04 DIAGNOSIS — E1169 Type 2 diabetes mellitus with other specified complication: Secondary | ICD-10-CM

## 2019-10-04 LAB — GLUCOSE, POCT (MANUAL RESULT ENTRY): POC Glucose: 88 mg/dl (ref 70–99)

## 2019-10-04 LAB — POCT GLYCOSYLATED HEMOGLOBIN (HGB A1C): HbA1c, POC (controlled diabetic range): 6.7 % (ref 0.0–7.0)

## 2019-10-04 MED ORDER — GABAPENTIN 300 MG PO CAPS: ORAL_CAPSULE | ORAL | 1 refills | Status: DC

## 2019-10-04 MED ORDER — VICTOZA 18 MG/3ML ~~LOC~~ SOPN: 1.8000 mg | PEN_INJECTOR | Freq: Every day | SUBCUTANEOUS | 6 refills | Status: DC

## 2019-10-04 MED ORDER — TOUJEO SOLOSTAR 300 UNIT/ML ~~LOC~~ SOPN: 55.0000 [IU] | PEN_INJECTOR | Freq: Every day | SUBCUTANEOUS | 6 refills | Status: DC

## 2019-10-04 MED ORDER — METFORMIN HCL ER 500 MG PO TB24: 1000.0000 mg | ORAL_TABLET | Freq: Two times a day (BID) | ORAL | 1 refills | Status: DC

## 2019-10-04 MED ORDER — FUROSEMIDE 20 MG PO TABS: 20.0000 mg | ORAL_TABLET | Freq: Every day | ORAL | 1 refills | Status: DC

## 2019-10-04 MED ORDER — FERROUS SULFATE 325 (65 FE) MG PO TBEC: 325.0000 mg | DELAYED_RELEASE_TABLET | Freq: Two times a day (BID) | ORAL | 6 refills | Status: DC

## 2019-10-04 MED ORDER — ERGOCALCIFEROL 1.25 MG (50000 UT) PO CAPS: 50000.0000 [IU] | ORAL_CAPSULE | ORAL | 0 refills | Status: DC

## 2019-10-04 MED ORDER — HYDROCHLOROTHIAZIDE 25 MG PO TABS: 25.0000 mg | ORAL_TABLET | Freq: Every day | ORAL | 3 refills | Status: DC

## 2019-10-04 MED ORDER — ATORVASTATIN CALCIUM 10 MG PO TABS: 10.0000 mg | ORAL_TABLET | Freq: Every day | ORAL | 1 refills | Status: DC

## 2019-10-04 MED ORDER — PANTOPRAZOLE SODIUM 40 MG PO TBEC: 40.0000 mg | DELAYED_RELEASE_TABLET | Freq: Two times a day (BID) | ORAL | 1 refills | Status: DC

## 2019-10-04 MED ORDER — LISINOPRIL 10 MG PO TABS: 10.0000 mg | ORAL_TABLET | Freq: Every day | ORAL | 1 refills | Status: DC

## 2019-10-04 MED ORDER — METOPROLOL TARTRATE 50 MG PO TABS: 50.0000 mg | ORAL_TABLET | Freq: Two times a day (BID) | ORAL | 1 refills | Status: DC

## 2019-10-04 MED FILL — TOUJEO SOLOSTAR 300 UNITS/M: 300 | 24 days supply | Qty: 5 | Fill #0

## 2019-10-04 MED FILL — VICTOZA 18 MG/3 ML INJECT P: 18 | 10 days supply | Qty: 3 | Fill #0

## 2019-10-04 NOTE — Progress Notes (Signed)
 Subjective:  Patient ID: Deborah Coffey, female    DOB: 09/24/1955  Age: 64 y.o. MRN: [id number]  CC: Diabetes   HPI Deborah Coffey is a 62year female with a history of Type 2 DM (A1c 6.7), Diabetic neuropathy, Hypertension, CAD (s/p CABG) here for a follow-up visit. A1c is 6.7 which is down from 10.1 and she denies presence of hypoglycemic episodes.She has diabetic retinopathy and underwent R eye surgery recently but continues to have R eye blurry vision; appointment with Ophthalmology later this week.  Neuropathy is controlled. Doing well on her antihypertensive and tolerating her statin. With regards to her CAD she has no chest pain or dyspnea. She still smokes 10 cig/day and is not ready to quit. She has no acute concerns today.  Past Medical History:  Diagnosis Date  . Allergy   . Anemia   . CAD in native artery 02/2004   Mid LAD lesoin just after Large D1 --> CABG x 1 LIMA-LAD  . CAP (community acquired pneumonia) 08/07/2014  . Chronic bronchitis (HCC)    "I think I get it q yr" (08/07/2014)  . Depression   . High cholesterol   . History of blood transfusion 2005   "related to [facility]"   . Hypertension   . 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 competitive flow  . Type II diabetes mellitus (HCC)     Past Surgical History:  Procedure Laterality Date  . CARDIAC CATHETERIZATION  2010   Previous LAD 95% lesion - now ~30-40%; patent LIMA with competitive flow  . CESAREAN SECTION  1977; 1989  . CHOLECYSTECTOMY  ~ 2012  . CORONARY ARTERY BYPASS GRAFT  02/2004   "CABG X1" (03/22/2013); LIMA-LAD  . TUBAL LIGATION  1989    Family History  Problem Relation Age of Onset  . Heart disease Mother   . Diabetes Mother   . Cancer Mother        cervical  . Diabetes Father   . Diabetes Sister   . Heart disease Sister   . Diabetes Brother   . Heart attack Brother   . Lupus Sister   . Heart failure Sister   . Hepatitis C  Sister   . Heart failure Brother   . Diabetes Brother   . Hypertension Daughter   . Colon cancer Neg Hx     Allergies  Allergen Reactions  . Ciprofloxacin Rash    Outpatient Medications Prior to Visit  Medication Sig Dispense Refill  . ACCU-CHEK FASTCLIX LANCETS MISC Use as directed to check blood glucose 3 times daily. E11.9 100 each 5  . acetaminophen (TYLENOL) 500 MG tablet Take 1,000 mg by mouth every 6 (six) hours as needed for headache (pain).     . Alcohol Swabs (B-D SINGLE USE SWABS REGULAR) PADS USE AS DIRECTED 100 each 0  . aspirin EC 81 MG tablet Take 81 mg by mouth daily.    . atorvastatin (LIPITOR) 10 MG tablet TAKE 1 TABLET EVERY DAY 90 tablet 1  . Blood Glucose Monitoring Suppl (ACCU-CHEK NANO SMARTVIEW) w/Device KIT Use as directed to check blood glucose 3 times daily. E11.9 1 kit 0  . clobetasol cream (TEMOVATE) 0.05 % Apply topically 2 (two) times daily. 60 g 0  . diclofenac sodium (VOLTAREN) 1 % GEL APPLY 4 GRAMS TOPICALLY FOUR TIMES DAILY 400 g 0  . ergocalciferol (DRISDOL) 1.25 MG (50000 UT) capsule Take 1 capsule (50,000 Units total) by mouth   once a week. 12 capsule 0  . ferrous sulfate 325 (65 FE) MG EC tablet Take 1 tablet (325 mg total) by mouth 2 (two) times daily. 60 tablet 3  . furosemide (LASIX) 20 MG tablet TAKE 1 TABLET EVERY DAY 90 tablet 1  . gabapentin (NEURONTIN) 300 MG capsule Take orally 2 caps (600 mg) in the morning and 3 capsules (900 mg) in the evening 450 capsule 1  . glucose blood (ACCU-CHEK SMARTVIEW) test strip Use as instructed to test blood glucose 3 times daily. E11.9 100 each 12  . hydrochlorothiazide (HYDRODIURIL) 25 MG tablet Take 1 tablet (25 mg total) by mouth daily. 90 tablet 3  . ibuprofen (ADVIL) 600 MG tablet Take 1 tablet (600 mg total) by mouth every 6 (six) hours as needed. 30 tablet 0  . Insulin Pen Needle 31G X 8 MM MISC Use to inject Toujeo once daily. 100 each 4  . liraglutide (VICTOZA) 18 MG/3ML SOPN Start 0.32m SQ once a  day for 7 days, then increase to 1.270monce a day for 7 days then 1.8 mg once daily thereafter 3 pen 3  . lisinopril (ZESTRIL) 10 MG tablet TAKE 1 TABLET EVERY DAY 90 tablet 1  . metFORMIN (GLUCOPHAGE-XR) 500 MG 24 hr tablet TAKE 2 TABLETS (1,000 MG TOTAL) BY MOUTH 2 (TWO) TIMES DAILY. 360 tablet 1  . metoprolol tartrate (LOPRESSOR) 50 MG tablet TAKE 1 TABLET TWICE DAILY 180 tablet 1  . Multiple Vitamin (MULTIVITAMIN WITH MINERALS) TABS tablet Take 1 tablet by mouth daily.    . nitroGLYCERIN (NITROSTAT) 0.4 MG SL tablet Place 1 tablet (0.4 mg total) under the tongue every 5 (five) minutes as needed for chest pain. 25 tablet 3  . pantoprazole (PROTONIX) 40 MG tablet TAKE 1 TABLET TWICE DAILY 180 tablet 1  . cyclobenzaprine (FLEXERIL) 10 MG tablet Take 1 tablet (10 mg total) by mouth 2 (two) times daily as needed for muscle spasms. (Patient not taking: Reported on 10/04/2019) 20 tablet 0  . Insulin Glargine, 1 Unit Dial, (TOUJEO SOLOSTAR) 300 UNIT/ML SOPN Inject 55 Units/day into the skin daily. 30 mL 6  . predniSONE (DELTASONE) 20 MG tablet 3 tabs po day one, then 2 tabs daily x 4 days (Patient not taking: Reported on 10/04/2019) 11 tablet 0   No facility-administered medications prior to visit.     ROS Review of Systems  Constitutional: Negative for activity change, appetite change and fatigue.  HENT: Negative for congestion, sinus pressure and sore throat.   Eyes: Negative for visual disturbance.  Respiratory: Negative for cough, chest tightness, shortness of breath and wheezing.   Cardiovascular: Negative for chest pain and palpitations.  Gastrointestinal: Negative for abdominal distention, abdominal pain and constipation.  Endocrine: Negative for polydipsia.  Genitourinary: Negative for dysuria and frequency.  Musculoskeletal: Negative for arthralgias and back pain.  Skin: Negative for rash.  Neurological: Negative for tremors, light-headedness and numbness.  Hematological: Does not  bruise/bleed easily.  Psychiatric/Behavioral: Negative for agitation and behavioral problems.    Objective:  BP 122/74   Pulse 94   Ht 5' 7" (1.702 m)   Wt 256 lb 12.8 oz (116.5 kg)   LMP  (LMP Unknown)   SpO2 99%   BMI 40.22 kg/m   BP/Weight 10/04/2019 09/19/2019 05/20/73/1025Systolic BP [id number] BP 74 57 74  Wt. (Lbs) 256.8 245 257  BMI 40.22 38.37 41.48      Physical Exam Constitutional:      Appearance: She  is well-developed.  Neck:     Vascular: No JVD.  Cardiovascular:     Rate and Rhythm: Normal rate.     Heart sounds: Normal heart sounds. No murmur heard.   Pulmonary:     Effort: Pulmonary effort is normal.     Breath sounds: Normal breath sounds. No wheezing or rales.  Chest:     Chest wall: No tenderness.  Abdominal:     General: Bowel sounds are normal. There is no distension.     Palpations: Abdomen is soft. There is no mass.     Tenderness: There is no abdominal tenderness.  Musculoskeletal:        General: Normal range of motion.     Right lower leg: No edema.     Left lower leg: No edema.  Neurological:     Mental Status: She is alert and oriented to person, place, and time.  Psychiatric:        Mood and Affect: Mood normal.     CMP Latest Ref Rng & Units 06/13/2019 02/12/2019 02/10/2018  Glucose 65 - 99 mg/dL 232(H) 375(H) 53(L)  BUN 8 - 27 mg/dL _0 Creatinine 0.57 - 1.00 mg/dL 1.11(H) 1.04(H) 1.13(H)  Sodium 134 - 144 mmol/L 138 141 145(H)  Potassium 3.5 - 5.2 mmol/L 4.6 4.4 4.7  Chloride 96 - 106 mmol/L 101 105 107(H)  CO2 20 - 29 mmol/L _1 Calcium 8.7 - 10.3 mg/dL 9.8 9.6 9.6  Total Protein 6.0 - 8.5 g/dL - - 6.9  Total Bilirubin 0.0 - 1.2 mg/dL - - <0.2  Alkaline Phos 39 - 117 IU/L - - 89  AST 0 - 40 IU/L - - 14  ALT 0 - 32 IU/L - - 13    Lipid Panel     Component Value Date/Time   CHOL 150 06/13/2019 1035   TRIG 111 06/13/2019 1035   HDL 66 06/13/2019 1035   CHOLHDL 2.3 06/13/2019 1035   CHOLHDL 2.2  09/02/2016 0533   VLDL 13 09/02/2016 0533   LDLCALC 64 06/13/2019 1035    CBC    Component Value Date/Time   WBC 6.2 02/12/2019 1015   WBC 8.2 09/17/2016 1459   RBC 3.71 (L) 02/12/2019 1015   RBC 3.96 09/17/2016 1459   HGB 9.4 (L) 02/12/2019 1015   HCT 30.8 (L) 02/12/2019 1015   PLT 309 02/12/2019 1015   MCV 83 02/12/2019 1015   MCH 25.3 (L) 02/12/2019 1015   MCH 24.2 (L) 09/17/2016 1459   MCHC 30.5 (L) 02/12/2019 1015   MCHC 30.9 09/17/2016 1459   RDW 16.6 (H) 02/12/2019 1015   LYMPHSABS 3.3 09/02/2016 0038   MONOABS 0.6 09/02/2016 0038   EOSABS 0.2 09/02/2016 0038   BASOSABS 0.0 09/02/2016 0038   Lab Results  Component Value Date   HGBA1C 6.7 10/04/2019     Assessment & Plan:   1. Type 2 diabetes mellitus with other specified complication, with long-term current use of insulin (HCC) Controlled with A1c of 6.7 down from 10.1; goal is <7 Commended on improvement Continue current regimen Counseled on Diabetic diet, my plate method, 637 minutes of moderate intensity exercise/week Blood sugar logs with fasting goals of 80-120 mg/dl, random of less than 180 and in the event of sugars less than 60 mg/dl or greater than 400 mg/dl encouraged to notify the clinic. Advised on the need for annual eye exams, annual foot exams, Pneumonia vaccine. - POCT glucose (manual entry) - POCT glycosylated  hemoglobin (Hb A1C) - liraglutide (VICTOZA) 18 MG/3ML SOPN; Inject 0.3 mLs (1.8 mg total) into the skin daily.  Dispense: 3 pen; Refill: 6 - CMP14+EGFR - Lipid panel - Microalbumin / creatinine urine ratio  2. Tobacco use Spent 3 mins on smoking cessation counseling but she is not ready to quit  3. Hyperlipidemia associated with type 2 diabetes mellitus (HCC) Controlled Low cholesterol diet - insulin glargine, 1 Unit Dial, (TOUJEO SOLOSTAR) 300 UNIT/ML Solostar Pen; Inject 55 Units into the skin daily.  Dispense: 30 mL; Refill: 6 - atorvastatin (LIPITOR) 10 MG tablet; Take 1 tablet  (10 mg total) by mouth daily.  Dispense: 90 tablet; Refill: 1 - gabapentin (NEURONTIN) 300 MG capsule; Take orally 2 caps (600 mg) in the morning and 3 capsules (900 mg) in the evening  Dispense: 450 capsule; Refill: 1 - metFORMIN (GLUCOPHAGE-XR) 500 MG 24 hr tablet; Take 2 tablets (1,000 mg total) by mouth 2 (two) times daily.  Dispense: 360 tablet; Refill: 1  4. Normocytic anemia Previously referred to Hematology - ferrous sulfate 325 (65 FE) MG EC tablet; Take 1 tablet (325 mg total) by mouth 2 (two) times daily.  Dispense: 60 tablet; Refill: 6  5. Diabetic polyneuropathy associated with type 2 diabetes mellitus (HCC) Controlled - gabapentin (NEURONTIN) 300 MG capsule; Take orally 2 caps (600 mg) in the morning and 3 capsules (900 mg) in the evening  Dispense: 450 capsule; Refill: 1  6. Essential hypertension Controlled Counseled on blood pressure goal of less than 130/80, low-sodium, DASH diet, medication compliance, 150 minutes of moderate intensity exercise per week. Discussed medication compliance, adverse effects. - hydrochlorothiazide (HYDRODIURIL) 25 MG tablet; Take 1 tablet (25 mg total) by mouth daily.  Dispense: 90 tablet; Refill: 3 - lisinopril (ZESTRIL) 10 MG tablet; Take 1 tablet (10 mg total) by mouth daily.  Dispense: 90 tablet; Refill: 1 - metoprolol tartrate (LOPRESSOR) 50 MG tablet; Take 1 tablet (50 mg total) by mouth 2 (two) times daily.  Dispense: 180 tablet; Refill: 1 - pantoprazole (PROTONIX) 40 MG tablet; Take 1 tablet (40 mg total) by mouth 2 (two) times daily.  Dispense: 180 tablet; Refill: 1  7. Vitamin D deficiency - VITAMIN D 25 Hydroxy (Vit-D Deficiency, Fractures)   No orders of the defined types were placed in this encounter.   Return in about 6 months (around 04/04/2020) for chronic disease management.       Enobong Newlin, MD, FAAFP. [facility] [place], [place] [phone]   10/04/2019, 9:06 AM 

## 2019-10-04 NOTE — Patient Instructions (Signed)
Diabetes Mellitus and Exercise Exercising regularly is important for your overall health, especially when you have diabetes (diabetes mellitus). Exercising is not only about losing weight. It has many other health benefits, such as increasing muscle strength and bone density and reducing body fat and stress. This leads to improved fitness, flexibility, and endurance, all of which result in better overall health. Exercise has additional benefits for people with diabetes, including:  Reducing appetite.  Helping to lower and control blood glucose.  Lowering blood pressure.  Helping to control amounts of fatty substances (lipids) in the blood, such as cholesterol and triglycerides.  Helping the body to respond better to insulin (improving insulin sensitivity).  Reducing how much insulin the body needs.  Decreasing the risk for heart disease by: ? Lowering cholesterol and triglyceride levels. ? Increasing the levels of good cholesterol. ? Lowering blood glucose levels. What is my activity plan? Your health care provider or certified diabetes educator can help you make a plan for the type and frequency of exercise (activity plan) that works for you. Make sure that you:  Do at least 150 minutes of moderate-intensity or vigorous-intensity exercise each week. This could be brisk walking, biking, or water aerobics. ? Do stretching and strength exercises, such as yoga or weightlifting, at least 2 times a week. ? Spread out your activity over at least 3 days of the week.  Get some form of physical activity every day. ? Do not go more than 2 days in a row without some kind of physical activity. ? Avoid being inactive for more than 30 minutes at a time. Take frequent breaks to walk or stretch.  Choose a type of exercise or activity that you enjoy, and set realistic goals.  Start slowly, and gradually increase the intensity of your exercise over time. What do I need to know about managing my  diabetes?   Check your blood glucose before and after exercising. ? If your blood glucose is 240 mg/dL (13.3 mmol/L) or higher before you exercise, check your urine for ketones. If you have ketones in your urine, do not exercise until your blood glucose returns to normal. ? If your blood glucose is 100 mg/dL (5.6 mmol/L) or lower, eat a snack containing 15-20 grams of carbohydrate. Check your blood glucose 15 minutes after the snack to make sure that your level is above 100 mg/dL (5.6 mmol/L) before you start your exercise.  Know the symptoms of low blood glucose (hypoglycemia) and how to treat it. Your risk for hypoglycemia increases during and after exercise. Common symptoms of hypoglycemia can include: ? Hunger. ? Anxiety. ? Sweating and feeling clammy. ? Confusion. ? Dizziness or feeling light-headed. ? Increased heart rate or palpitations. ? Blurry vision. ? Tingling or numbness around the mouth, lips, or tongue. ? Tremors or shakes. ? Irritability.  Keep a rapid-acting carbohydrate snack available before, during, and after exercise to help prevent or treat hypoglycemia.  Avoid injecting insulin into areas of the body that are going to be exercised. For example, avoid injecting insulin into: ? The arms, when playing tennis. ? The legs, when jogging.  Keep records of your exercise habits. Doing this can help you and your health care provider adjust your diabetes management plan as needed. Write down: ? Food that you eat before and after you exercise. ? Blood glucose levels before and after you exercise. ? The type and amount of exercise you have done. ? When your insulin is expected to peak, if you use   insulin. Avoid exercising at times when your insulin is peaking.  When you start a new exercise or activity, work with your health care provider to make sure the activity is safe for you, and to adjust your insulin, medicines, or food intake as needed.  Drink plenty of water while  you exercise to prevent dehydration or heat stroke. Drink enough fluid to keep your urine clear or pale yellow. Summary  Exercising regularly is important for your overall health, especially when you have diabetes (diabetes mellitus).  Exercising has many health benefits, such as increasing muscle strength and bone density and reducing body fat and stress.  Your health care provider or certified diabetes educator can help you make a plan for the type and frequency of exercise (activity plan) that works for you.  When you start a new exercise or activity, work with your health care provider to make sure the activity is safe for you, and to adjust your insulin, medicines, or food intake as needed. This information is not intended to replace advice given to you by your health care provider. Make sure you discuss any questions you have with your health care provider. Document Revised: 10/28/2016 Document Reviewed: 09/15/2015 Elsevier Patient Education  2020 [employer]  

## 2019-10-04 NOTE — Progress Notes (Signed)
Patient is using both pharmacies on file, insulin to [facility].

## 2019-10-06 LAB — CMP14+EGFR
ALT: 15 IU/L (ref 0–32)
AST: 14 IU/L (ref 0–40)
Albumin/Globulin Ratio: 1.3 (ref 1.2–2.2)
Albumin: 4 g/dL (ref 3.8–4.8)
Alkaline Phosphatase: 120 IU/L (ref 48–121)
BUN/Creatinine Ratio: 17 (ref 12–28)
BUN: 24 mg/dL (ref 8–27)
Bilirubin Total: <0.2 mg/dL (ref 0.0–1.2)
CO2: 20 mmol/L (ref 20–29)
Calcium: 9.8 mg/dL (ref 8.7–10.3)
Chloride: 104 mmol/L (ref 96–106)
Creatinine, Ser: 1.41 mg/dL — ABNORMAL HIGH (ref 0.57–1.00)
GFR calc Af Amer: 46 mL/min/{1.73_m2} — ABNORMAL LOW (ref >59)
GFR calc non Af Amer: 40 mL/min/{1.73_m2} — ABNORMAL LOW (ref >59)
Globulin, Total: 3.2 g/dL (ref 1.5–4.5)
Glucose: 99 mg/dL (ref 65–99)
Potassium: 5 mmol/L (ref 3.5–5.2)
Sodium: 140 mmol/L (ref 134–144)
Total Protein: 7.2 g/dL (ref 6.0–8.5)

## 2019-10-06 LAB — LIPID PANEL
Chol/HDL Ratio: 2.6 ratio (ref 0.0–4.4)
Cholesterol, Total: 143 mg/dL (ref 100–199)
HDL: 56 mg/dL (ref >39)
LDL Chol Calc (NIH): 67 mg/dL (ref 0–99)
Triglycerides: 112 mg/dL (ref 0–149)
VLDL Cholesterol Cal: 20 mg/dL (ref 5–40)

## 2019-10-06 LAB — VITAMIN D 25 HYDROXY (VIT D DEFICIENCY, FRACTURES): Vit D, 25-Hydroxy: 26 ng/mL — ABNORMAL LOW (ref 30.0–100.0)

## 2019-10-09 ENCOUNTER — Telehealth: Payer: Self-pay

## 2019-10-09 DIAGNOSIS — H4311 Vitreous hemorrhage, right eye: Secondary | ICD-10-CM | POA: Diagnosis not present

## 2019-10-09 DIAGNOSIS — E113513 Type 2 diabetes mellitus with proliferative diabetic retinopathy with macular edema, bilateral: Secondary | ICD-10-CM | POA: Diagnosis not present

## 2019-10-09 NOTE — Telephone Encounter (Signed)
-----   Message from Charlott Rakes, MD sent at 10/08/2019  8:12 AM EDT ----- Vitamin D level is low I have sent a prescription for Drisdol for 12-week course to her pharmacy and she will need a repeat level once completed.  Kidney function shows a slight decline.  Please advise to abstain from medications like ibuprofen and Aleve.  Cholesterol is normal.

## 2019-10-09 NOTE — Telephone Encounter (Signed)
Patient was called and a voicemail was left informing patient to return phone call for lab results. 

## 2019-10-09 NOTE — Telephone Encounter (Signed)
Patient called in and requested for lab results. Patient was identified by 2 patient identifiers. Results were given, patient verbalized understanding and had no further questions or concerns at this time.

## 2019-10-10 NOTE — Telephone Encounter (Signed)
noted 

## 2019-10-23 MED FILL — VICTOZA 18 MG/3 ML INJECT P: 18 | 30 days supply | Qty: 9 | Fill #0

## 2019-10-24 DIAGNOSIS — E113511 Type 2 diabetes mellitus with proliferative diabetic retinopathy with macular edema, right eye: Secondary | ICD-10-CM | POA: Diagnosis not present

## 2019-11-01 ENCOUNTER — Other Ambulatory Visit: Payer: Self-pay

## 2019-11-01 ENCOUNTER — Ambulatory Visit: Payer: Medicare HMO | Admitting: Cardiology

## 2019-11-01 ENCOUNTER — Encounter: Payer: Self-pay | Admitting: Cardiology

## 2019-11-01 VITALS — BP 116/62 | HR 101 | Ht 64.0 in | Wt 259.8 lb

## 2019-11-01 DIAGNOSIS — E119 Type 2 diabetes mellitus without complications: Secondary | ICD-10-CM

## 2019-11-01 DIAGNOSIS — I2510 Atherosclerotic heart disease of native coronary artery without angina pectoris: Secondary | ICD-10-CM | POA: Diagnosis not present

## 2019-11-01 DIAGNOSIS — Z720 Tobacco use: Secondary | ICD-10-CM | POA: Diagnosis not present

## 2019-11-01 DIAGNOSIS — I10 Essential (primary) hypertension: Secondary | ICD-10-CM | POA: Diagnosis not present

## 2019-11-01 NOTE — Patient Instructions (Signed)
Medication Instructions:  No changes today *If you need a refill on your cardiac medications before your next appointment, please call your pharmacy*   Lab Work: none If you have labs (blood work) drawn today and your tests are completely normal, you will receive your results only by: Marland Kitchen MyChart Message (if you have MyChart) OR . A paper copy in the mail If you have any lab test that is abnormal or we need to change your treatment, we will call you to review the results.   Testing/Procedures: none   Follow-Up: At [facility], you and your health needs are our priority.  As part of our continuing mission to provide you with exceptional heart care, we have created designated Provider Care Teams.  These Care Teams include your primary Cardiologist (physician) and Advanced Practice Providers (APPs -  Physician Assistants and Nurse Practitioners) who all work together to provide you with the care you need, when you need it.  Your next appointment:   12 month(s)  The format for your next appointment:   In Person  Provider:   You may see Candee Furbish, MD or one of the following Advanced Practice Providers on your designated Care Team:    Truitt Merle, NP  Cecilie Kicks, NP  Kathyrn Drown, NP    Other Instructions

## 2019-11-01 NOTE — Progress Notes (Signed)
Cardiology Office Note:    Date:  11/01/2019   ID:  Deborah Coffey, DOB 10-Feb-1956, MRN [id number]  PCP:  Charlott Rakes, MD  [facility] Cardiologist:  Candee Furbish, MD  [facility] Electrophysiologist:  None   Referring MD: Charlott Rakes, MD     History of Present Illness:    Deborah Coffey is a 64 y.o. female here for follow up of CAD.  CABG 2005 - LIMA to LAD only  NUC stress 2014 - low risk, no ischemia  LIMA-LAD; patent by cath in 2010 (LAD lesion actually improved. competitive flow  Occasional pedal edema.  She has been experiencing some shortness of breath over the last several months.  Past Medical History:  Diagnosis Date  . Allergy   . Anemia   . CAD in native artery 02/2004   Mid LAD lesoin just after Large D1 --> CABG x 1 LIMA-LAD  . CAP (community acquired pneumonia) 08/07/2014  . Chronic bronchitis ([place])    "I think I get it q yr" (08/07/2014)  . Depression   . High cholesterol   . History of blood transfusion 2005   "related to [facility]"   . Hypertension   . 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 competitive flow  . Type II diabetes mellitus ([place])     Past Surgical History:  Procedure Laterality Date  . CARDIAC CATHETERIZATION  2010   Previous LAD 95% lesion - now ~30-40%; patent LIMA with competitive flow  . CESAREAN SECTION  1977; 1989  . CHOLECYSTECTOMY  ~ 2012  . CORONARY ARTERY BYPASS GRAFT  02/2004   "CABG X1" (03/22/2013); LIMA-LAD  . TUBAL LIGATION  1989    Current Medications: Current Meds  Medication Sig  . ACCU-CHEK FASTCLIX LANCETS MISC Use as directed to check blood glucose 3 times daily. E11.9  . acetaminophen (TYLENOL) 500 MG tablet Take 1,000 mg by mouth every 6 (six) hours as needed for headache (pain).   . Alcohol Swabs (B-D SINGLE USE SWABS REGULAR) PADS USE AS DIRECTED  . aspirin EC 81 MG tablet Take 81 mg by mouth daily.  Marland Kitchen atorvastatin (LIPITOR) 10 MG tablet  Take 1 tablet (10 mg total) by mouth daily.  . Blood Glucose Monitoring Suppl (ACCU-CHEK NANO SMARTVIEW) w/Device KIT Use as directed to check blood glucose 3 times daily. E11.9  . clobetasol cream (TEMOVATE) 0.05 % Apply topically 2 (two) times daily.  . cyclobenzaprine (FLEXERIL) 10 MG tablet Take 1 tablet (10 mg total) by mouth 2 (two) times daily as needed for muscle spasms.  . diclofenac sodium (VOLTAREN) 1 % GEL APPLY 4 GRAMS TOPICALLY FOUR TIMES DAILY  . ergocalciferol (DRISDOL) 1.25 MG (50000 UT) capsule Take 1 capsule (50,000 Units total) by mouth once a week.  . ferrous sulfate 325 (65 FE) MG EC tablet Take 1 tablet (325 mg total) by mouth 2 (two) times daily.  . furosemide (LASIX) 20 MG tablet Take 1 tablet (20 mg total) by mouth daily.  Marland Kitchen gabapentin (NEURONTIN) 300 MG capsule Take orally 2 caps (600 mg) in the morning and 3 capsules (900 mg) in the evening  . glucose blood (ACCU-CHEK SMARTVIEW) test strip Use as instructed to test blood glucose 3 times daily. E11.9  . hydrochlorothiazide (HYDRODIURIL) 25 MG tablet Take 1 tablet (25 mg total) by mouth daily.  Marland Kitchen ibuprofen (ADVIL) 600 MG tablet Take 1 tablet (600 mg total) by mouth every 6 (six) hours as needed.  Marland Kitchen  insulin glargine, 1 Unit Dial, (TOUJEO SOLOSTAR) 300 UNIT/ML Solostar Pen Inject 55 Units into the skin daily.  . Insulin Pen Needle 31G X 8 MM MISC Use to inject Toujeo once daily.  Marland Kitchen ketorolac (ACULAR) 0.5 % ophthalmic solution   . liraglutide (VICTOZA) 18 MG/3ML SOPN Inject 0.3 mLs (1.8 mg total) into the skin daily.  Marland Kitchen lisinopril (ZESTRIL) 10 MG tablet Take 1 tablet (10 mg total) by mouth daily.  . metFORMIN (GLUCOPHAGE-XR) 500 MG 24 hr tablet Take 2 tablets (1,000 mg total) by mouth 2 (two) times daily.  . metoprolol tartrate (LOPRESSOR) 50 MG tablet Take 1 tablet (50 mg total) by mouth 2 (two) times daily.  . Multiple Vitamin (MULTIVITAMIN WITH MINERALS) TABS tablet Take 1 tablet by mouth daily.  . nitroGLYCERIN  (NITROSTAT) 0.4 MG SL tablet Place 1 tablet (0.4 mg total) under the tongue every 5 (five) minutes as needed for chest pain.  . pantoprazole (PROTONIX) 40 MG tablet Take 1 tablet (40 mg total) by mouth 2 (two) times daily.     Allergies:   Ciprofloxacin   Social History   Socioeconomic History  . Marital status: Divorced    Spouse name: Not on file  . Number of children: Not on file  . Years of education: Not on file  . Highest education level: Not on file  Occupational History  . Not on file  Tobacco Use  . Smoking status: Current Some Day Smoker    Packs/day: 0.50    Years: 42.00    Pack years: 21.00    Types: Cigarettes  . Smokeless tobacco: Never Used  Vaping Use  . Vaping Use: Never used  Substance and Sexual Activity  . Alcohol use: Yes    Alcohol/week: 1.0 - 2.0 standard drink    Types: 1 - 2 Glasses of wine per week    Comment: 08/07/2014 "glass of wine maybe 2 times/yr"  . Drug use: No    Types: Marijuana    Comment: smoked pot years ago but not currently  . Sexual activity: Not Currently  Other Topics Concern  . Not on file  Social History Narrative  . Not on file   Social Determinants of Health   Financial Resource Strain:   . Difficulty of Paying Living Expenses:   Food Insecurity:   . Worried About Charity fundraiser in the Last Year:   . Arboriculturist in the Last Year:   Transportation Needs:   . Film/video editor (Medical):   Marland Kitchen Lack of Transportation (Non-Medical):   Physical Activity:   . Days of Exercise per Week:   . Minutes of Exercise per Session:   Stress:   . Feeling of Stress :   Social Connections:   . Frequency of Communication with Friends and Family:   . Frequency of Social Gatherings with Friends and Family:   . Attends Religious Services:   . Active Member of Clubs or Organizations:   . Attends Archivist Meetings:   Marland Kitchen Marital Status:      Family History: The patient's family history includes Cancer in her  mother; Diabetes in her brother, brother, father, mother, and sister; Heart attack in her brother; Heart disease in her mother and sister; Heart failure in her brother and sister; Hepatitis C in her sister; Hypertension in her daughter; Lupus in her sister. There is no history of Colon cancer.  ROS:   Please see the history of present illness.  All other systems reviewed and are negative.  EKGs/Labs/Other Studies Reviewed:    The following studies were reviewed today:  Echo 2019-EF 60%, PA pressure 50  Nuclear stress 2019-no ischemia  LIMA-LAD; patent by cath in 2010 (LAD lesion actually improved. competitive flow  EKG:  EKG is  ordered today.  The ekg ordered today demonstrates ST 101 NSSTW changes  Recent Labs: 02/12/2019: Hemoglobin 9.4; Platelets 309; TSH 1.520 10/04/2019: ALT 15; BUN 24; Creatinine, Ser 1.41; Potassium 5.0; Sodium 140  Recent Lipid Panel    Component Value Date/Time   CHOL 143 10/04/2019 0940   TRIG 112 10/04/2019 0940   HDL 56 10/04/2019 0940   CHOLHDL 2.6 10/04/2019 0940   CHOLHDL 2.2 09/02/2016 0533   VLDL 13 09/02/2016 0533   LDLCALC 67 10/04/2019 0940    Physical Exam:    VS:  BP 116/62   Pulse (!) 101   Ht _0  (1.626 m)   Wt 259 lb 12.8 oz (117.8 kg)   LMP  (LMP Unknown)   SpO2 95%   BMI 44.59 kg/m     Wt Readings from Last 3 Encounters:  11/01/19 259 lb 12.8 oz (117.8 kg)  10/04/19 256 lb 12.8 oz (116.5 kg)  09/19/19 245 lb (111.1 kg)     GEN:  Well nourished, well developed in no acute distress HEENT: Normal NECK: No JVD; No carotid bruits LYMPHATICS: No lymphadenopathy CARDIAC: RRR, no murmurs, rubs, gallops RESPIRATORY:  Clear to auscultation without rales, wheezing or rhonchi  ABDOMEN: Soft, non-tender, non-distended MUSCULOSKELETAL:  No edema; No deformity  SKIN: Warm and dry NEUROLOGIC:  Alert and oriented x 3 PSYCHIATRIC:  Normal affect   ASSESSMENT:    1. Coronary artery disease involving native coronary artery  of native heart without angina pectoris   2. Tobacco use   3. Diabetes mellitus with coincident hypertension ([place])    PLAN:    In order of problems listed above:  Coronary artery disease status post CABG 1 in 2005 - LIMA to LAD - Echocardiogram shows normal wall motion.   - NUC stress 2019 no ischemia (nuclear EF was calculated lower than echo EF.  Echo EF likely the more accurate since it was visualized.) -Not having any chest discomfort however she is feeling shortness of breath.  She does state that this has been going on for several years.  Increased weight.  Still smoking.  Motivated to change lifestyle.  Secondary pulmonary hypertension - Smoking, weight discussed both.Decrease carbohydrates.    Continue to work on this.  Tobacco use - Continue to encourage cessation. This is of utmost importance for cardiovascular health.  Her grandchildren often remind her to try to stop.  She is still struggling with this. Continue to encourage  Prior TIA - Stable.  No new symptoms.    Diabetes with hypertension - Medications reviewed. Per primary.  Trying to watch sweets.  Her hemoglobin A1c has improved greatly.  No changes made.  Grade 1 diastolic dysfunction - This finding is to be expected with age as well as obesity. - Continue with current medications.  Blood pressure control.  Pulmonary hypertension, secondary -Continue to encourage smoking cessation. Weight loss.  Spoke out this again today.    Medication Adjustments/Labs and Tests Ordered: Current medicines are reviewed at length with the patient today.  Concerns regarding medicines are outlined above.  Orders Placed This Encounter  Procedures  . EKG 12-Lead   No orders of the defined types were placed in this encounter.  Patient Instructions  Medication Instructions:  No changes today *If you need a refill on your cardiac medications before your next appointment, please call your  pharmacy*   Lab Work: none If you have labs (blood work) drawn today and your tests are completely normal, you will receive your results only by: Marland Kitchen MyChart Message (if you have MyChart) OR . A paper copy in the mail If you have any lab test that is abnormal or we need to change your treatment, we will call you to review the results.   Testing/Procedures: none   Follow-Up: At [facility], you and your health needs are our priority.  As part of our continuing mission to provide you with exceptional heart care, we have created designated Provider Care Teams.  These Care Teams include your primary Cardiologist (physician) and Advanced Practice Providers (APPs -  Physician Assistants and Nurse Practitioners) who all work together to provide you with the care you need, when you need it.  Your next appointment:   12 month(s)  The format for your next appointment:   In Person  Provider:   You may see Candee Furbish, MD or one of the following Advanced Practice Providers on your designated Care Team:    Truitt Merle, NP  Cecilie Kicks, NP  Kathyrn Drown, NP    Other Instructions       Signed, Candee Furbish, MD  11/01/2019 10:30 AM    [place]

## 2019-11-21 DIAGNOSIS — E113512 Type 2 diabetes mellitus with proliferative diabetic retinopathy with macular edema, left eye: Secondary | ICD-10-CM | POA: Diagnosis not present

## 2019-12-25 DIAGNOSIS — Z961 Presence of intraocular lens: Secondary | ICD-10-CM | POA: Diagnosis not present

## 2019-12-25 DIAGNOSIS — H25812 Combined forms of age-related cataract, left eye: Secondary | ICD-10-CM | POA: Diagnosis not present

## 2020-01-23 ENCOUNTER — Ambulatory Visit (HOSPITAL_COMMUNITY)
Admission: EM | Admit: 2020-01-23 | Discharge: 2020-01-23 | Disposition: A | Discharge status: Home / Self Care | Payer: Medicare HMO | Attending: Family Medicine | Admitting: Family Medicine

## 2020-01-23 ENCOUNTER — Emergency Department (HOSPITAL_COMMUNITY)
Admission: EM | Admit: 2020-01-23 | Discharge: 2020-01-23 | Disposition: A | Discharge status: Home / Self Care | Payer: Medicare HMO | Attending: Emergency Medicine | Admitting: Emergency Medicine

## 2020-01-23 ENCOUNTER — Emergency Department (HOSPITAL_COMMUNITY): Payer: Medicare HMO

## 2020-01-23 ENCOUNTER — Encounter (HOSPITAL_COMMUNITY): Payer: Self-pay | Admitting: Emergency Medicine

## 2020-01-23 ENCOUNTER — Encounter (HOSPITAL_COMMUNITY): Payer: Self-pay

## 2020-01-23 ENCOUNTER — Other Ambulatory Visit: Payer: Self-pay

## 2020-01-23 DIAGNOSIS — F17210 Nicotine dependence, cigarettes, uncomplicated: Secondary | ICD-10-CM | POA: Diagnosis not present

## 2020-01-23 DIAGNOSIS — Z20822 Contact with and (suspected) exposure to covid-19: Secondary | ICD-10-CM | POA: Insufficient documentation

## 2020-01-23 DIAGNOSIS — I2510 Atherosclerotic heart disease of native coronary artery without angina pectoris: Secondary | ICD-10-CM | POA: Diagnosis not present

## 2020-01-23 DIAGNOSIS — Z7982 Long term (current) use of aspirin: Secondary | ICD-10-CM | POA: Diagnosis not present

## 2020-01-23 DIAGNOSIS — R059 Cough, unspecified: Secondary | ICD-10-CM | POA: Diagnosis not present

## 2020-01-23 DIAGNOSIS — Z951 Presence of aortocoronary bypass graft: Secondary | ICD-10-CM | POA: Insufficient documentation

## 2020-01-23 DIAGNOSIS — Z7984 Long term (current) use of oral hypoglycemic drugs: Secondary | ICD-10-CM | POA: Diagnosis not present

## 2020-01-23 DIAGNOSIS — Z79899 Other long term (current) drug therapy: Secondary | ICD-10-CM | POA: Insufficient documentation

## 2020-01-23 DIAGNOSIS — Z794 Long term (current) use of insulin: Secondary | ICD-10-CM | POA: Insufficient documentation

## 2020-01-23 DIAGNOSIS — J40 Bronchitis, not specified as acute or chronic: Secondary | ICD-10-CM | POA: Insufficient documentation

## 2020-01-23 DIAGNOSIS — E1141 Type 2 diabetes mellitus with diabetic mononeuropathy: Secondary | ICD-10-CM | POA: Insufficient documentation

## 2020-01-23 DIAGNOSIS — I5032 Chronic diastolic (congestive) heart failure: Secondary | ICD-10-CM | POA: Diagnosis not present

## 2020-01-23 DIAGNOSIS — I110 Hypertensive heart disease with heart failure: Secondary | ICD-10-CM | POA: Insufficient documentation

## 2020-01-23 DIAGNOSIS — R55 Syncope and collapse: Secondary | ICD-10-CM

## 2020-01-23 DIAGNOSIS — R0602 Shortness of breath: Secondary | ICD-10-CM | POA: Diagnosis not present

## 2020-01-23 DIAGNOSIS — R404 Transient alteration of awareness: Secondary | ICD-10-CM | POA: Diagnosis not present

## 2020-01-23 LAB — CBC WITH DIFFERENTIAL/PLATELET
Abs Immature Granulocytes: 0.03 10*3/uL (ref 0.00–0.07)
Basophils Absolute: 0 10*3/uL (ref 0.0–0.1)
Basophils Relative: 1 %
Eosinophils Absolute: 0.2 10*3/uL (ref 0.0–0.5)
Eosinophils Relative: 2 %
HCT: 35 % — ABNORMAL LOW (ref 36.0–46.0)
Hemoglobin: 10.6 g/dL — ABNORMAL LOW (ref 12.0–15.0)
Immature Granulocytes: 0 %
Lymphocytes Relative: 23 %
Lymphs Abs: 1.6 10*3/uL (ref 0.7–4.0)
MCH: 26.2 pg (ref 26.0–34.0)
MCHC: 30.3 g/dL (ref 30.0–36.0)
MCV: 86.6 fL (ref 80.0–100.0)
Monocytes Absolute: 0.4 10*3/uL (ref 0.1–1.0)
Monocytes Relative: 6 %
Neutro Abs: 4.8 10*3/uL (ref 1.7–7.7)
Neutrophils Relative %: 68 %
Platelets: 369 10*3/uL (ref 150–400)
RBC: 4.04 MIL/uL (ref 3.87–5.11)
RDW: 17.7 % — ABNORMAL HIGH (ref 11.5–15.5)
WBC: 7.1 10*3/uL (ref 4.0–10.5)
nRBC: 0 % (ref 0.0–0.2)

## 2020-01-23 LAB — COMPREHENSIVE METABOLIC PANEL
ALT: 26 U/L (ref 0–44)
AST: 61 U/L — ABNORMAL HIGH (ref 15–41)
Albumin: 3.2 g/dL — ABNORMAL LOW (ref 3.5–5.0)
Alkaline Phosphatase: 77 U/L (ref 38–126)
Anion gap: 7 (ref 5–15)
BUN: 23 mg/dL (ref 8–23)
CO2: 23 mmol/L (ref 22–32)
Calcium: 8.6 mg/dL — ABNORMAL LOW (ref 8.9–10.3)
Chloride: 106 mmol/L (ref 98–111)
Creatinine, Ser: 1.37 mg/dL — ABNORMAL HIGH (ref 0.44–1.00)
GFR calc non Af Amer: 41 mL/min — ABNORMAL LOW (ref >60)
Glucose, Bld: 66 mg/dL — ABNORMAL LOW (ref 70–99)
Potassium: >7.5 mmol/L (ref 3.5–5.1)
Sodium: 136 mmol/L (ref 135–145)
Total Bilirubin: 2 mg/dL — ABNORMAL HIGH (ref 0.3–1.2)
Total Protein: 6.5 g/dL (ref 6.5–8.1)

## 2020-01-23 LAB — RESPIRATORY PANEL BY RT PCR (FLU A&B, COVID)
Influenza A by PCR: NEGATIVE
Influenza B by PCR: NEGATIVE
SARS Coronavirus 2 by RT PCR: NEGATIVE

## 2020-01-23 LAB — POTASSIUM: Potassium: 4.3 mmol/L (ref 3.5–5.1)

## 2020-01-23 LAB — CBG MONITORING, ED: Glucose-Capillary: 97 mg/dL (ref 70–99)

## 2020-01-23 MED ORDER — MAGNESIUM SULFATE 2 GM/50ML IV SOLN
2.0000 g | Freq: Once | INTRAVENOUS | Status: AC
  Administered 2020-01-23: 2 g via INTRAVENOUS
  Filled 2020-01-23: qty 50

## 2020-01-23 MED ORDER — AEROCHAMBER PLUS FLO-VU LARGE MISC
Status: AC
  Filled 2020-01-23: qty 1

## 2020-01-23 MED ORDER — METHYLPREDNISOLONE SODIUM SUCC 125 MG IJ SOLR
125.0000 mg | Freq: Once | INTRAMUSCULAR | Status: AC
  Administered 2020-01-23: 125 mg via INTRAVENOUS
  Filled 2020-01-23: qty 2

## 2020-01-23 MED ORDER — ALBUTEROL SULFATE HFA 108 (90 BASE) MCG/ACT IN AERS
8.0000 | INHALATION_SPRAY | RESPIRATORY_TRACT | Status: DC | PRN
  Filled 2020-01-23: qty 6.7

## 2020-01-23 MED ORDER — AEROCHAMBER PLUS FLO-VU MEDIUM MISC
1.0000 | Freq: Once | Status: DC
  Filled 2020-01-23: qty 1

## 2020-01-23 MED ORDER — IPRATROPIUM BROMIDE HFA 17 MCG/ACT IN AERS
4.0000 | INHALATION_SPRAY | Freq: Once | RESPIRATORY_TRACT | Status: AC
  Administered 2020-01-23: 4 via RESPIRATORY_TRACT
  Filled 2020-01-23: qty 12.9

## 2020-01-23 MED ORDER — PREDNISONE 20 MG PO TABS
40.0000 mg | ORAL_TABLET | Freq: Every day | ORAL | 0 refills | Status: DC
Start: 2020-01-23 — End: 2020-01-23

## 2020-01-23 MED ORDER — PREDNISONE 20 MG PO TABS
40.0000 mg | ORAL_TABLET | Freq: Every day | ORAL | 0 refills | Status: DC
Start: 2020-01-23 — End: 2020-07-22

## 2020-01-23 NOTE — ED Notes (Signed)
Patient and daughter at triage. Patient sitting in wheelchair.  Patient has eyes closed, but answering questions accurately.  Patient is following commands.  Patient does open eyes when instructed to do so.  No visible injury.  Reported that patient had a syncopal episode at the home, not witnessed, but daughter heard her fall in the other part of the house. Patient was alert when family arrived at her side.  Patient had a coughing episode prior to near syncopal episode.   Patient skin is warm and dry, easy respirations.  Daughter reports patient has a scratch on back.  Patient is not on blood thinners.  Notified dr hagler of patient .  desiree , cma instucted to get vital signs on patient

## 2020-01-23 NOTE — Discharge Instructions (Signed)
The x-ray looked normal and your Covid test was negative.  Your lab work also looks good.  This is most likely bronchitis from weather or a cold.  You will need to use the Atrovent inhaler 2 puffs 2 times a day for the next 2 days and then you can use the albuterol inhaler 2 puffs every 4-6 hours as needed for coughing and wheezing.  Start taking the prednisone tomorrow and if you start having worsening shortness of breath or any further episodes of passing out you should return to the emergency room.

## 2020-01-23 NOTE — ED Provider Notes (Signed)
[facility] EMERGENCY DEPARTMENT Provider Note   CSN: [id number] Arrival date & time: 01/23/20  1708     History Chief Complaint  Patient presents with  . Weakness  . Loss of Consciousness  . Cough    Deborah Coffey is a 64 y.o. female.  Patient is patient is a 64 year old female with a history of chronic bronchitis, CAD status post CABG, diabetes, ongoing tobacco use who presents today with several symptoms.  Patient reports yesterday she started to feel poorly with cough, congestion, facial pain, headache.  She reports the cough is productive of clear or white sputum but very minimal.  She coughed all night and has intermittently felt mildly short of breath.  Today she felt worse.  Her grandchild was at home because there was a Covid exposure at school and she was having to stay home.  She does report that her grandchild has also had congestion and a cough.  She was coughing in the kitchen when she suddenly felt hot and flushed and had an episode of syncope.  She has not had any chest pain or palpitations.  She denies any injury from her syncopal event.  She still complaining of cough and mild shortness of breath.  She has not received the Covid vaccine.  She has had no recent medication changes and reports that she does not have inhalers at home.  No unilateral leg pain or swelling.  No recent weight gain.  The history is provided by the patient.  Weakness Associated symptoms: cough and syncope   Loss of Consciousness Associated symptoms: weakness   Cough      Past Medical History:  Diagnosis Date  . Allergy   . Anemia   . CAD in native artery 02/2004   Mid LAD lesoin just after Large D1 --> CABG x 1 LIMA-LAD  . CAP (community acquired pneumonia) 08/07/2014  . Chronic bronchitis ([place])    "I think I get it q yr" (08/07/2014)  . Depression   . High cholesterol   . History of blood transfusion 2005   "related to OHS"   . Hypertension   . S/P CABG x 1  02/2004   LIMA-LAD; patent by cath in 2010 (LAD lesion actually improved. competitive flow  . Type II diabetes mellitus [facility])     Patient Active Problem List   Diagnosis Date Noted  . Facial numbness 09/03/2016  . Chronic diastolic CHF (congestive heart failure) ([place]) 09/03/2016  . Anxiety   . Eczema 01/29/2016  . History of adenomatous polyp of colon 10/01/2015  . Floaters in visual field 09/04/2015  . SOB (shortness of breath) 06/26/2015  . Screening for colon cancer 06/26/2015  . Type 2 diabetes mellitus with complication, with long-term current use of insulin ([place]) 02/27/2015  . Diabetic mononeuropathy associated with type 2 diabetes mellitus ([place]) 02/27/2015  . LLQ abdominal pain   . CAP (community acquired pneumonia) 08/07/2014  . AKI (acute kidney injury) ([place]) 08/07/2014  . Type 2 diabetes mellitus with hyperglycemia ([place]) 07/08/2014  . Preventative health care 07/08/2014  . Depression 07/08/2014  . Acute upper respiratory infection 07/08/2014  . Smoking addiction 07/08/2014  . Essential hypertension 01/10/2014  . Depression (em[place]) 01/10/2014  . H/O TIA (transient ischemic attack) and stroke 01/10/2014  . Visual changes 01/02/2014  . Facial weakness 01/02/2014  . Conversion disorder 01/01/2014  . Tobacco use 06/20/2013  . Diabetic retinopathy ([place]) 04/03/2013  . Atypical chest pain 03/22/2013  . HTN (hypertension) 03/22/2013  .  Essential hypertension, benign 02/14/2013  . Accelerated hypertension 09/19/2012  . Uncontrolled diabetes mellitus (HCC) 09/19/2012  . Dyslipidemia 09/19/2012  . CAD (coronary artery disease) - CABG x 1 (LIMA-LAD) 02/21/2004    Past Surgical History:  Procedure Laterality Date  . CARDIAC CATHETERIZATION  2010   Previous LAD 95% lesion - now ~30-40%; patent LIMA with competitive flow  . CESAREAN SECTION  1977; 1989  . CHOLECYSTECTOMY  ~ 2012  . CORONARY ARTERY BYPASS GRAFT  02/2004   "CABG X1" (03/22/2013); LIMA-LAD  . TUBAL LIGATION   1989     OB History   No obstetric history on file.     Family History  Problem Relation Age of Onset  . Heart disease Mother   . Diabetes Mother   . Cancer Mother        cervical  . Diabetes Father   . Diabetes Sister   . Heart disease Sister   . Diabetes Brother   . Heart attack Brother   . Lupus Sister   . Heart failure Sister   . Hepatitis C Sister   . Heart failure Brother   . Diabetes Brother   . Hypertension Daughter   . Colon cancer Neg Hx     Social History   Tobacco Use  . Smoking status: Current Some Day Smoker    Packs/day: 0.50    Years: 42.00    Pack years: 21.00    Types: Cigarettes  . Smokeless tobacco: Never Used  Vaping Use  . Vaping Use: Never used  Substance Use Topics  . Alcohol use: Yes    Alcohol/week: 1.0 - 2.0 standard drink    Types: 1 - 2 Glasses of wine per week    Comment: 08/07/2014 "glass of wine maybe 2 times/yr"  . Drug use: No    Types: Marijuana    Comment: smoked pot years ago but not currently    Home Medications Prior to Admission medications   Medication Sig Start Date End Date Taking? Authorizing Provider  acetaminophen (TYLENOL) 500 MG tablet Take 1,000 mg by mouth every 6 (six) hours as needed for headache (pain).    Yes [provider]  aspirin EC 81 MG tablet Take 81 mg by mouth daily.   Yes [provider]  atorvastatin (LIPITOR) 10 MG tablet Take 1 tablet (10 mg total) by mouth daily. 10/04/19  Yes Hoy Register, MD  cyclobenzaprine (FLEXERIL) 10 MG tablet Take 1 tablet (10 mg total) by mouth 2 (two) times daily as needed for muscle spasms. 09/19/19  Yes Fayrene Helper, PA-C  ergocalciferol (DRISDOL) 1.25 MG (50000 UT) capsule Take 1 capsule (50,000 Units total) by mouth once a week. 10/04/19  Yes Hoy Register, MD  ferrous sulfate 325 (65 FE) MG EC tablet Take 1 tablet (325 mg total) by mouth 2 (two) times daily. 10/04/19  Yes Hoy Register, MD  furosemide (LASIX) 20 MG tablet Take 1 tablet (20 mg  total) by mouth daily. 10/04/19  Yes Hoy Register, MD  gabapentin (NEURONTIN) 300 MG capsule Take orally 2 caps (600 mg) in the morning and 3 capsules (900 mg) in the evening 10/04/19  Yes Newlin, Enobong, MD  hydrochlorothiazide (HYDRODIURIL) 25 MG tablet Take 1 tablet (25 mg total) by mouth daily. 10/04/19  Yes Newlin, Enobong, MD  insulin glargine, 1 Unit Dial, (TOUJEO SOLOSTAR) 300 UNIT/ML Solostar Pen Inject 55 Units into the skin daily. 10/04/19 01/23/20 Yes Newlin, Enobong, MD  liraglutide (VICTOZA) 18 MG/3ML SOPN Inject 0.3 mLs (1.8  mg total) into the skin daily. 10/04/19  Yes Charlott Rakes, MD  lisinopril (ZESTRIL) 10 MG tablet Take 1 tablet (10 mg total) by mouth daily. 10/04/19  Yes Charlott Rakes, MD  metFORMIN (GLUCOPHAGE-XR) 500 MG 24 hr tablet Take 2 tablets (1,000 mg total) by mouth 2 (two) times daily. Patient taking differently: Take 500 mg by mouth 2 (two) times daily.  10/04/19  Yes Charlott Rakes, MD  metoprolol tartrate (LOPRESSOR) 50 MG tablet Take 1 tablet (50 mg total) by mouth 2 (two) times daily. 10/04/19  Yes Charlott Rakes, MD  Multiple Vitamin (MULTIVITAMIN WITH MINERALS) TABS tablet Take 1 tablet by mouth daily.   Yes [provider]  nitroGLYCERIN (NITROSTAT) 0.4 MG SL tablet Place 1 tablet (0.4 mg total) under the tongue every 5 (five) minutes as needed for chest pain. 03/22/17  Yes Isaiah Serge, NP  pantoprazole (PROTONIX) 40 MG tablet Take 1 tablet (40 mg total) by mouth 2 (two) times daily. 10/04/19  Yes Charlott Rakes, MD  ACCU-CHEK FASTCLIX LANCETS MISC Use as directed to check blood glucose 3 times daily. E11.9 02/08/17   Tresa Garter, MD  Alcohol Swabs (B-D SINGLE USE SWABS REGULAR) PADS USE AS DIRECTED 04/13/17   Tresa Garter, MD  Blood Glucose Monitoring Suppl (ACCU-CHEK NANO SMARTVIEW) w/Device KIT Use as directed to check blood glucose 3 times daily. E11.9 07/07/16   Tresa Garter, MD  clobetasol cream (TEMOVATE) 0.05 % Apply  topically 2 (two) times daily. Patient not taking: Reported on 01/23/2020 02/09/19   Fulp, Cammie, MD  diclofenac sodium (VOLTAREN) 1 % GEL APPLY 4 GRAMS TOPICALLY FOUR TIMES DAILY Patient not taking: Reported on 01/23/2020 10/02/18   Charlott Rakes, MD  glucose blood (ACCU-CHEK SMARTVIEW) test strip Use as instructed to test blood glucose 3 times daily. E11.9 07/15/16   Tresa Garter, MD  ibuprofen (ADVIL) 600 MG tablet Take 1 tablet (600 mg total) by mouth every 6 (six) hours as needed. Patient not taking: Reported on 01/23/2020 09/19/19   Domenic Moras, PA-C  Insulin Pen Needle 31G X 8 MM MISC Use to inject Toujeo once daily. 12/06/18   Charlott Rakes, MD    Allergies    Ciprofloxacin  Review of Systems   Review of Systems  Respiratory: Positive for cough.   Cardiovascular: Positive for syncope.  Neurological: Positive for weakness.  All other systems reviewed and are negative.   Physical Exam Updated Vital Signs BP 123/85   Pulse 69   Temp 98.6 F (37 C) (Oral)   Resp 17   Ht $R'5\' 7"'TQ$  (1.702 m)   Wt 108.9 kg   LMP  (LMP Unknown)   SpO2 98%   BMI 37.59 kg/m   Physical Exam Vitals and nursing note reviewed.  Constitutional:      General: She is not in acute distress.    Appearance: Normal appearance. She is well-developed. She is obese.  HENT:     Head: Normocephalic and atraumatic.     Mouth/Throat:     Mouth: Mucous membranes are moist.  Eyes:     Pupils: Pupils are equal, round, and reactive to light.  Cardiovascular:     Rate and Rhythm: Normal rate and regular rhythm.     Pulses: Normal pulses.     Heart sounds: Normal heart sounds. No murmur heard.  No friction rub.  Pulmonary:     Effort: Pulmonary effort is normal. Tachypnea present.     Breath sounds: Wheezing present. No rales.  Abdominal:     General: Bowel sounds are normal. There is no distension.     Palpations: Abdomen is soft.     Tenderness: There is no abdominal tenderness. There is no guarding  or rebound.  Musculoskeletal:        General: No tenderness. Normal range of motion.     Right lower leg: No edema.     Left lower leg: No edema.     Comments: No edema  Skin:    General: Skin is warm and dry.     Findings: No rash.  Neurological:     General: No focal deficit present.     Mental Status: She is alert and oriented to person, place, and time. Mental status is at baseline.     Cranial Nerves: No cranial nerve deficit.  Psychiatric:        Mood and Affect: Mood normal.        Behavior: Behavior normal.        Thought Content: Thought content normal.     ED Results / Procedures / Treatments   Labs (all labs ordered are listed, but only abnormal results are displayed) Labs Reviewed  CBC WITH DIFFERENTIAL/PLATELET - Abnormal; Notable for the following components:      Result Value   Hemoglobin 10.6 (*)    HCT 35.0 (*)    RDW 17.7 (*)    All other components within normal limits  COMPREHENSIVE METABOLIC PANEL - Abnormal; Notable for the following components:   Potassium >7.5 (*)    Glucose, Bld 66 (*)    Creatinine, Ser 1.37 (*)    Calcium 8.6 (*)    Albumin 3.2 (*)    AST 61 (*)    Total Bilirubin 2.0 (*)    GFR calc non Af Amer 41 (*)    All other components within normal limits  RESPIRATORY PANEL BY RT PCR (FLU A&B, COVID)  POTASSIUM    EKG EKG Interpretation  Date/Time:  Wednesday January 23 2020 19:17:11 EDT Ventricular Rate:  69 PR Interval:    QRS Duration: 85 QT Interval:  406 QTC Calculation: 435 R Axis:   50 Text Interpretation: Sinus rhythm Nonspecific T abnormalities, anterior leads No significant change since last tracing Confirmed by Blanchie Dessert ([id number]) on 01/23/2020 7:34:58 PM   Radiology DG Chest Port 1 View  Result Date: 01/23/2020 CLINICAL DATA:  Shortness of breath EXAM: PORTABLE CHEST 1 VIEW COMPARISON:  None. FINDINGS: The heart size and mediastinal contours are within normal limits. Overlying median sternotomy wires are  present. Both lungs are clear. The visualized skeletal structures are unremarkable. IMPRESSION: No active disease. Electronically Signed   By: Prudencio Pair M.D.   On: 01/23/2020 19:03    Procedures Procedures (including critical care time)  Medications Ordered in ED Medications  methylPREDNISolone sodium succinate (SOLU-MEDROL) 125 mg/2 mL injection 125 mg (has no administration in time range)  magnesium sulfate IVPB 2 g 50 mL (has no administration in time range)  albuterol (VENTOLIN HFA) 108 (90 Base) MCG/ACT inhaler 8 puff (has no administration in time range)  ipratropium (ATROVENT HFA) inhaler 4 puff (has no administration in time range)  AeroChamber Plus Flo-Vu Medium MISC 1 each (has no administration in time range)    ED Course  I have reviewed the triage vital signs and the nursing notes.  Pertinent labs & imaging results that were available during my care of the patient were reviewed by me and considered in my medical decision making (see  chart for details).    MDM Rules/Calculators/A&P                          Patient is a 64 year old female presenting today with URI symptoms with diffuse wheezing, oxygen saturation of approximately 93% on room air in the setting of being a chronic tobacco user.  Concern for Covid versus bronchitis versus undiagnosed COPD flare.  Patient given albuterol, Atrovent inhaler, Solu-Medrol and magnesium.  Also patient had a syncopal event during a coughing attack and suspect most likely cough syncope.  However she does have a significant cardiac history of CAD status post CABG.  She is denying any chest pain will will do an EKG for screening.  Labs and Covid test are pending.  Chest x-ray and EKG are pending.  Will reevaluate for symptom improvement.  7:35 PM Chest x-ray without acute findings.  EKG is unchanged.  Labs are reassuring except for potassium of greater than 7.5 but this is most likely related to hemolysis his creatinine is within normal  limits.  On repeat evaluation wheezing has completely resolved and patient feels much better.  Will wait for Covid and repeat potassium.  8:19 PM COVID is neg.  Potassium is normal.  MDM Number of Diagnoses or Management Options   Amount and/or Complexity of Data Reviewed Clinical lab tests: ordered and reviewed Tests in the radiology section of CPT: ordered and reviewed Tests in the medicine section of CPT: ordered and reviewed Decide to obtain previous medical records or to obtain history from someone other than the patient: yes Obtain history from someone other than the patient: no Review and summarize past medical records: yes Discuss the patient with other providers: yes Independent visualization of images, tracings, or specimens: yes  Risk of Complications, Morbidity, and/or Mortality Presenting problems: high Diagnostic procedures: low Management options: low  Patient Progress Patient progress: improved    Final Clinical Impression(s) / ED Diagnoses Final diagnoses:  Bronchitis    Rx / DC Orders ED Discharge Orders         Ordered    predniSONE (DELTASONE) 20 MG tablet  Daily        01/23/20 2021           Blanchie Dessert, MD 01/23/20 2351

## 2020-01-23 NOTE — ED Notes (Signed)
Patient is being discharged from the Urgent Care and sent to the Emergency Department via Ems . Per Tressia Miners, NP, patient is in need of higher level of care due to syncope, lethargy. Patient is aware and verbalizes understanding of plan of care.  Vitals:   01/23/20 1610  BP: 126/60  Pulse: 71  Resp: 16  Temp: 98.2 F (36.8 C)  SpO2: 98%

## 2020-01-23 NOTE — ED Notes (Signed)
EMS called for patient per Traci,NP.

## 2020-01-23 NOTE — ED Triage Notes (Signed)
Pts daughter brings her in due to cough onset 2 days ago. Pt states today she just started feeling bad. Daughter reports around 1500 she was coughing and has LOC and was [place]. She states as she was about to start CPR she came to and was answering her questions. Patient states she has a hx of bronchitis, TIA, and pneumonia.

## 2020-01-23 NOTE — ED Triage Notes (Signed)
Pt BIB [facility] from UC c/o a syncopal episode, weakness and a cough x3 days. Pt states she started with a cough 3 days ago. Last night her throat started hurting and the coughing was "bad" Pt states she was coughing up a lot of white mucus. Pt states she was feeling "funny" today. Tried to make her grandbaby some food and passed out for a couple of seconds in the kitchen. EMS stated family states pt was out for about 5-6 secs. Pt c/o of 4/10 pain to her head and throat. Pt states she felt very dizzy before passing out. Pt is alert and oriented.

## 2020-01-30 ENCOUNTER — Ambulatory Visit: Payer: Self-pay | Admitting: *Deleted

## 2020-01-30 NOTE — Telephone Encounter (Signed)
Patient called stating she had heart surgery in 2005 and now she has swollen feet and left leg are swollen and productive cough; she was seen in urgent care/ED on 11/23/19; she is unable to lay down without coughing; she also has a hard time deep breaths when laying down; the leg/feet swelling started 01/27/20; she has to wear flip flops; the pt says feels like she can not get oxygen to her brain; recommendations made per nurse triage; the pt declined having EMS called because her daughter will take her; reiterated to pt that he symptoms merit prompt medical evaluation; she again states her daughter will take her to the ED; thept [facility]; will route to office for notification. Reason for Disposition  Sounds like a life-threatening emergency to the triager  Answer Assessment - Initial Assessment Questions 1. RESPIRATORY STATUS: "Describe your breathing?" (e.g., wheezing, shortness of breath, unable to speak, severe coughing)      shortness 2. ONSET: "When did this breathing problem begin?"     01/23/20 3. PATTERN "Does the difficult breathing come and go, or has it been constant since it started?"     intermittent 4. SEVERITY: "How bad is your breathing?" (e.g., mild, moderate, severe)    - MILD: No SOB at rest, mild SOB with walking, speaks normally in sentences, can lay down, no retractions, pulse < 100.    - MODERATE: SOB at rest, SOB with minimal exertion and prefers to sit, cannot lie down flat, speaks in phrases, mild retractions, audible wheezing, pulse 100-120.    - SEVERE: Very SOB at rest, speaks in single words, struggling to breathe, sitting hunched forward, retractions, pulse > 120      moderate 5. RECURRENT SYMPTOM: "Have you had difficulty breathing before?" If Yes, ask: "When was the last time?" and "What happened that time?"     Seen in ED 01/23/20 6. CARDIAC HISTORY: "Do you have any history of heart disease?" (e.g., heart attack, angina, bypass  surgery, angioplasty)      Heart surgery 2005 7. LUNG HISTORY: "Do you have any history of lung disease?"  (e.g., pulmonary embolus, asthma, emphysema)     Treated for bronchitis 01/23/20 8. CAUSE: "What do you think is causing the breathing problem?"      ? CHF 9. OTHER SYMPTOMS: "Do you have any other symptoms? (e.g., dizziness, runny nose, cough, chest pain, fever)    Cough, leg and feet swelling L>R 10. PREGNANCY: "Is there any chance you are pregnant?" "When was your last menstrual period?"      no 11. TRAVEL: "Have you traveled out of the country in the last month?" (e.g., travel history, exposures)  Protocols used: BREATHING DIFFICULTY-A-AH

## 2020-02-11 ENCOUNTER — Other Ambulatory Visit: Payer: Self-pay | Admitting: Family Medicine

## 2020-02-11 DIAGNOSIS — L409 Psoriasis, unspecified: Secondary | ICD-10-CM

## 2020-02-11 DIAGNOSIS — E1165 Type 2 diabetes mellitus with hyperglycemia: Secondary | ICD-10-CM

## 2020-02-11 NOTE — Telephone Encounter (Signed)
Requested medication (s) are due for refill today: yes  Requested medication (s) are on the active medication list: yes  Last refill:  10/08/19  Future visit scheduled: no  Notes to clinic:  not delegated    Requested Prescriptions  Pending Prescriptions Disp Refills   Vitamin D, Ergocalciferol, (DRISDOL) 1.25 MG (50000 UNIT) CAPS capsule [Pharmacy Med Name: VITAMIN D 1.25 MG (50000 UT) Capsule] 12 capsule 0    Sig: TAKE 1 CAPSULE (50,000 UNITS TOTAL) BY MOUTH ONCE A WEEK.      Endocrinology:  Vitamins - Vitamin D Supplementation Failed - 02/11/2020 11:23 AM      Failed - 50,000 IU strengths are not delegated      Failed - Ca in normal range and within 360 days    Calcium  Date Value Ref Range Status  01/23/2020 8.6 (L) 8.9 - 10.3 mg/dL Final   Calcium, Ion  Date Value Ref Range Status  09/02/2016 1.09 (L) 1.15 - 1.40 mmol/L Final          Failed - Phosphate in normal range and within 360 days    No results found for: PHOS        Failed - Vitamin D in normal range and within 360 days    Vit D, 25-Hydroxy  Date Value Ref Range Status  10/04/2019 26.0 (L) 30.0 - 100.0 ng/mL Final    Comment:    Vitamin D deficiency has been defined by the Institute of Medicine and an Endocrine Society practice guideline as a level of serum 25-OH vitamin D less than 20 ng/mL (1,2). The Endocrine Society went on to further define vitamin D insufficiency as a level between 21 and 29 ng/mL (2). 1. IOM (Institute of Medicine). 2010. Dietary reference    intakes for calcium and D. [place]: The    [employer]. 2. Holick MF, Binkley [place], Bischoff-Ferrari HA, et al.    Evaluation, treatment, and prevention of vitamin D    deficiency: an Endocrine Society clinical practice    guideline. JCEM. 2011 Jul; 96(7):1911-30.           Passed - Valid encounter within last 12 months    Recent Outpatient Visits           4 months ago Type 2 diabetes mellitus with other specified  complication, with long-term current use of insulin ([place])   [place], Charlane Ferretti, MD   8 months ago Encounter for medication management   [place], Annie Main L, RPH-CPP   8 months ago Type 2 diabetes mellitus with hyperglycemia, with long-term current use of insulin ([place])   [place] [facility] [place], Charlane Ferretti, MD   1 year ago Uncontrolled type 2 diabetes mellitus with hyperglycemia ([place])   [place] Fulp, [place], MD   1 year ago Diabetic retinopathy associated with type 2 diabetes mellitus, macular edema presence unspecified, unspecified laterality, unspecified retinopathy severity ([place])   [place] [facility] Charlott Rakes, MD

## 2020-02-11 NOTE — Telephone Encounter (Signed)
Requested Prescriptions  Pending Prescriptions Disp Refills  . DROPLET PEN NEEDLES 31G X 8 MM MISC [Pharmacy Med Name: DROPLET PEN NEEDLES 31GX8MM 31G X 8 MM] 100 each 4    Sig: USE WITH TOUJEO SOLOSTAR PEN [place] DAY     Endocrinology: Diabetes - Testing Supplies Passed - 02/11/2020 11:23 AM      Passed - Valid encounter within last 12 months    Recent Outpatient Visits          4 months ago Type 2 diabetes mellitus with other specified complication, with long-term current use of insulin ([place])   [place], Charlane Ferretti, MD   8 months ago Encounter for medication management   [place], Annie Main L, RPH-CPP   8 months ago Type 2 diabetes mellitus with hyperglycemia, with long-term current use of insulin ([place])   [place], [place], MD   1 year ago Uncontrolled type 2 diabetes mellitus with hyperglycemia ([place])   [place] Fulp, [place], MD   1 year ago Diabetic retinopathy associated with type 2 diabetes mellitus, macular edema presence unspecified, unspecified laterality, unspecified retinopathy severity ([place])   [place], [place], MD             . clobetasol cream (TEMOVATE) 0.05 % [Pharmacy Med Name: CLOBETASOL PROPIONATE 0.05 % Cream] 60 g 0    Sig: APPLY TOPICALLY 2 (TWO) TIMES DAILY.     Dermatology:  Corticosteroids Passed - 02/11/2020 11:23 AM      Passed - Valid encounter within last 12 months    Recent Outpatient Visits          4 months ago Type 2 diabetes mellitus with other specified complication, with long-term current use of insulin ([place])   [place], Charlane Ferretti, MD   8 months ago Encounter for medication management   [place], Annie Main L, RPH-CPP   8 months ago Type 2 diabetes mellitus with hyperglycemia,  with long-term current use of insulin ([place])   [place] [facility] [place], Charlane Ferretti, MD   1 year ago Uncontrolled type 2 diabetes mellitus with hyperglycemia ([place])   [place] [facility] Fulp, [place], MD   1 year ago Diabetic retinopathy associated with type 2 diabetes mellitus, macular edema presence unspecified, unspecified laterality, unspecified retinopathy severity ([place])   [place] [facility] Charlott Rakes, MD

## 2020-02-20 ENCOUNTER — Other Ambulatory Visit: Payer: Self-pay | Admitting: Internal Medicine

## 2020-02-20 DIAGNOSIS — Z1231 Encounter for screening mammogram for malignant neoplasm of breast: Secondary | ICD-10-CM

## 2020-04-01 ENCOUNTER — Ambulatory Visit: Payer: Medicare HMO

## 2020-04-09 ENCOUNTER — Other Ambulatory Visit: Payer: Self-pay

## 2020-04-09 ENCOUNTER — Encounter (HOSPITAL_COMMUNITY): Payer: Self-pay | Admitting: Emergency Medicine

## 2020-04-09 ENCOUNTER — Emergency Department (HOSPITAL_COMMUNITY)
Admission: EM | Admit: 2020-04-09 | Discharge: 2020-04-09 | Disposition: A | Discharge status: Home / Self Care | Payer: Medicare HMO | Attending: Emergency Medicine | Admitting: Emergency Medicine

## 2020-04-09 DIAGNOSIS — R404 Transient alteration of awareness: Secondary | ICD-10-CM | POA: Diagnosis not present

## 2020-04-09 DIAGNOSIS — Z5321 Procedure and treatment not carried out due to patient leaving prior to being seen by health care provider: Secondary | ICD-10-CM | POA: Diagnosis not present

## 2020-04-09 DIAGNOSIS — R0902 Hypoxemia: Secondary | ICD-10-CM | POA: Diagnosis not present

## 2020-04-09 DIAGNOSIS — R531 Weakness: Secondary | ICD-10-CM | POA: Insufficient documentation

## 2020-04-09 DIAGNOSIS — R4020 Unspecified coma: Secondary | ICD-10-CM | POA: Diagnosis not present

## 2020-04-09 DIAGNOSIS — R55 Syncope and collapse: Secondary | ICD-10-CM | POA: Diagnosis not present

## 2020-04-09 DIAGNOSIS — E1165 Type 2 diabetes mellitus with hyperglycemia: Secondary | ICD-10-CM | POA: Diagnosis not present

## 2020-04-09 LAB — BASIC METABOLIC PANEL
Anion gap: 10 (ref 5–15)
BUN: 29 mg/dL — ABNORMAL HIGH (ref 8–23)
CO2: 24 mmol/L (ref 22–32)
Calcium: 9.1 mg/dL (ref 8.9–10.3)
Chloride: 103 mmol/L (ref 98–111)
Creatinine, Ser: 1.44 mg/dL — ABNORMAL HIGH (ref 0.44–1.00)
GFR, Estimated: 41 mL/min — ABNORMAL LOW (ref >60)
Glucose, Bld: 243 mg/dL — ABNORMAL HIGH (ref 70–99)
Potassium: 4.5 mmol/L (ref 3.5–5.1)
Sodium: 137 mmol/L (ref 135–145)

## 2020-04-09 LAB — URINALYSIS, ROUTINE W REFLEX MICROSCOPIC
Bilirubin Urine: NEGATIVE
Glucose, UA: 50 mg/dL — AB
Hgb urine dipstick: NEGATIVE
Ketones, ur: NEGATIVE mg/dL
Leukocytes,Ua: NEGATIVE
Nitrite: NEGATIVE
Protein, ur: NEGATIVE mg/dL
Specific Gravity, Urine: 1.01 (ref 1.005–1.030)
pH: 5 (ref 5.0–8.0)

## 2020-04-09 LAB — CBC
HCT: 36.7 % (ref 36.0–46.0)
Hemoglobin: 10.9 g/dL — ABNORMAL LOW (ref 12.0–15.0)
MCH: 26.5 pg (ref 26.0–34.0)
MCHC: 29.7 g/dL — ABNORMAL LOW (ref 30.0–36.0)
MCV: 89.1 fL (ref 80.0–100.0)
Platelets: 231 10*3/uL (ref 150–400)
RBC: 4.12 MIL/uL (ref 3.87–5.11)
RDW: 17 % — ABNORMAL HIGH (ref 11.5–15.5)
WBC: 6.3 10*3/uL (ref 4.0–10.5)
nRBC: 0 % (ref 0.0–0.2)

## 2020-04-09 NOTE — ED Triage Notes (Signed)
Pt BIB [facility] from home, family stated pt was unresponsive for 10 minutes. On EMS arrival, pt still unresponsive, when EMS attempted to place an NPA, pt woke up and stated "I'm not going to the hospital". Pt c/o generalized weakness and states her body "feels funny". A&O x 4 at this time.

## 2020-04-09 NOTE — ED Notes (Signed)
Pt discussing leaving with daughter, pt states that she did not want to come to the hospital and requested IV be taken out. Pt taken out to car.

## 2020-04-10 ENCOUNTER — Telehealth: Payer: Self-pay | Admitting: Cardiology

## 2020-04-10 NOTE — Telephone Encounter (Signed)
Pt c/o swelling: STAT is pt has developed SOB within 24 hours  1) How much weight have you gained and in what time span? 5 lbs or more in a week  2) If swelling, where is the swelling located? Both ankles and feet  3) Are you currently taking a fluid pill? Yes  4) Are you currently SOB? Yes   5) Do you have a log of your daily weights (if so, list)? No  6) Have you gained 3 pounds in a day or 5 pounds in a week? Yes  7) Have you traveled recently? No

## 2020-04-10 NOTE — Telephone Encounter (Signed)
Pt's daughter calling to report pt having 2 episodes of syncope yesterday. She was taken to the ED where she was in the waiting room for >6 hrs per daughter. She was never seen despite being unresponsive when EMS examined her.  I personally spoke with the pt who is disoriented and heavily slurring her words. Her daughter took the phone and stated she believes her mother is having a stroke. Today her sx are worse then yesterday. I advised her to call 911 and let EMS know she may be having a stroke. Daughter was concerned with long ED wait times. I advised her to states she may be having a stroke as it should trigger a code stroke at the hospital.   She verbalized understanding and had no additional questions.

## 2020-04-14 NOTE — Telephone Encounter (Signed)
Thanks for the update, agree with plan. Donato Schultz, MD

## 2020-05-12 ENCOUNTER — Telehealth: Payer: Self-pay | Admitting: Family Medicine

## 2020-05-12 ENCOUNTER — Telehealth: Payer: Self-pay | Admitting: Cardiology

## 2020-05-12 NOTE — Telephone Encounter (Signed)
Pt c/o Shortness Of Breath: STAT if SOB developed within the last 24 hours or pt is noticeably SOB on the phone  1. Are you currently SOB (can you hear that pt is SOB on the phone)? no  2. How long have you been experiencing SOB? A week or 2   3. Are you SOB when sitting or when up moving around? Moving around  4. Are you currently experiencing any other symptoms? No   Patient states for a week or 2 she has been getting SOB when she walks. She states about 3 weeks ago she called EMS, because she was having slurred speech and felt like she would pass out. She states it felt like mini strokes. She states when they arrived she no longer had the symptoms. She states she does also get SOB when she talks for a long time and her memory has started getting slower.

## 2020-05-12 NOTE — Telephone Encounter (Signed)
Spoke with pt who reports having SOB, + productive cough of yellow sputum, swelling at her feet,ankles and abdomen.  She does not weigh daily and doesn't know if her wt is up or not.  She reports having SOB X 2 weeks now.  Offered pt a virtual visit with Dr Marlou Porch but she stated she doesn't know how to do that.  She has been unable to log on to MyChart for a long time now.  Instructed pt to increase Furosemide by 1 extra tablet daily for 3 days.  Advised to keep feet elevated above the level of her heart as much as possible during the day.  Advised to cut out any and all NA+ possible - no fast foods, prepared foods, deli meats etc.  She states understanding.  She will try these things and call back if no improvement in the next several days.  Will forward to Dr Marlou Porch for his knowledge.

## 2020-05-12 NOTE — Telephone Encounter (Signed)
Patient dropped off Application for Disability Parking Placard. Patient would like a call once form is completed. Place form in PCP box.

## 2020-05-13 NOTE — Telephone Encounter (Signed)
Agree with plan Melissa Tomaselli, MD  

## 2020-05-13 NOTE — Telephone Encounter (Signed)
Form has been received and placed in PCP box for signature. 

## 2020-06-05 ENCOUNTER — Ambulatory Visit: Payer: Medicare HMO | Admitting: Physician Assistant

## 2020-06-13 ENCOUNTER — Encounter (HOSPITAL_COMMUNITY): Payer: Self-pay

## 2020-06-13 ENCOUNTER — Emergency Department (HOSPITAL_COMMUNITY): Payer: Medicare HMO

## 2020-06-13 ENCOUNTER — Emergency Department (HOSPITAL_COMMUNITY)
Admission: EM | Admit: 2020-06-13 | Discharge: 2020-06-13 | Disposition: A | Discharge status: Home / Self Care | Payer: Medicare HMO | Attending: Emergency Medicine | Admitting: Emergency Medicine

## 2020-06-13 ENCOUNTER — Ambulatory Visit (INDEPENDENT_AMBULATORY_CARE_PROVIDER_SITE_OTHER): Payer: Medicare HMO | Admitting: Cardiology

## 2020-06-13 ENCOUNTER — Encounter: Payer: Self-pay | Admitting: Cardiology

## 2020-06-13 ENCOUNTER — Other Ambulatory Visit: Payer: Self-pay

## 2020-06-13 VITALS — BP 140/70 | HR 84 | Ht 67.0 in | Wt 258.0 lb

## 2020-06-13 DIAGNOSIS — E119 Type 2 diabetes mellitus without complications: Secondary | ICD-10-CM | POA: Diagnosis not present

## 2020-06-13 DIAGNOSIS — R0602 Shortness of breath: Secondary | ICD-10-CM | POA: Diagnosis not present

## 2020-06-13 DIAGNOSIS — R0600 Dyspnea, unspecified: Secondary | ICD-10-CM

## 2020-06-13 DIAGNOSIS — R609 Edema, unspecified: Secondary | ICD-10-CM | POA: Insufficient documentation

## 2020-06-13 DIAGNOSIS — I110 Hypertensive heart disease with heart failure: Secondary | ICD-10-CM | POA: Diagnosis not present

## 2020-06-13 DIAGNOSIS — R0609 Other forms of dyspnea: Secondary | ICD-10-CM

## 2020-06-13 DIAGNOSIS — R911 Solitary pulmonary nodule: Secondary | ICD-10-CM | POA: Diagnosis not present

## 2020-06-13 DIAGNOSIS — U071 COVID-19: Secondary | ICD-10-CM | POA: Diagnosis not present

## 2020-06-13 DIAGNOSIS — Z794 Long term (current) use of insulin: Secondary | ICD-10-CM | POA: Insufficient documentation

## 2020-06-13 DIAGNOSIS — Z7984 Long term (current) use of oral hypoglycemic drugs: Secondary | ICD-10-CM | POA: Insufficient documentation

## 2020-06-13 DIAGNOSIS — I2510 Atherosclerotic heart disease of native coronary artery without angina pectoris: Secondary | ICD-10-CM | POA: Insufficient documentation

## 2020-06-13 DIAGNOSIS — Z79899 Other long term (current) drug therapy: Secondary | ICD-10-CM | POA: Diagnosis not present

## 2020-06-13 DIAGNOSIS — I5032 Chronic diastolic (congestive) heart failure: Secondary | ICD-10-CM | POA: Diagnosis not present

## 2020-06-13 DIAGNOSIS — I10 Essential (primary) hypertension: Secondary | ICD-10-CM | POA: Diagnosis not present

## 2020-06-13 DIAGNOSIS — F17210 Nicotine dependence, cigarettes, uncomplicated: Secondary | ICD-10-CM | POA: Diagnosis not present

## 2020-06-13 DIAGNOSIS — Z7982 Long term (current) use of aspirin: Secondary | ICD-10-CM | POA: Diagnosis not present

## 2020-06-13 DIAGNOSIS — Z951 Presence of aortocoronary bypass graft: Secondary | ICD-10-CM | POA: Insufficient documentation

## 2020-06-13 DIAGNOSIS — I517 Cardiomegaly: Secondary | ICD-10-CM | POA: Diagnosis not present

## 2020-06-13 DIAGNOSIS — R0902 Hypoxemia: Secondary | ICD-10-CM | POA: Diagnosis not present

## 2020-06-13 DIAGNOSIS — J40 Bronchitis, not specified as acute or chronic: Secondary | ICD-10-CM | POA: Diagnosis not present

## 2020-06-13 LAB — I-STAT VENOUS BLOOD GAS, ED
Acid-Base Excess: 1 mmol/L (ref 0.0–2.0)
Bicarbonate: 26.4 mmol/L (ref 20.0–28.0)
Calcium, Ion: 1.19 mmol/L (ref 1.15–1.40)
HCT: 38 % (ref 36.0–46.0)
Hemoglobin: 12.9 g/dL (ref 12.0–15.0)
O2 Saturation: 42 %
Potassium: 4.3 mmol/L (ref 3.5–5.1)
Sodium: 139 mmol/L (ref 135–145)
TCO2: 28 mmol/L (ref 22–32)
pCO2, Ven: 45.4 mmHg (ref 44.0–60.0)
pH, Ven: 7.373 (ref 7.250–7.430)
pO2, Ven: 25 mmHg — CL (ref 32.0–45.0)

## 2020-06-13 LAB — CBC
HCT: 37.2 % (ref 36.0–46.0)
Hemoglobin: 11.2 g/dL — ABNORMAL LOW (ref 12.0–15.0)
MCH: 26.4 pg (ref 26.0–34.0)
MCHC: 30.1 g/dL (ref 30.0–36.0)
MCV: 87.5 fL (ref 80.0–100.0)
Platelets: 234 10*3/uL (ref 150–400)
RBC: 4.25 MIL/uL (ref 3.87–5.11)
RDW: 16.4 % — ABNORMAL HIGH (ref 11.5–15.5)
WBC: 4 10*3/uL (ref 4.0–10.5)
nRBC: 0 % (ref 0.0–0.2)

## 2020-06-13 LAB — BASIC METABOLIC PANEL
Anion gap: 10 (ref 5–15)
BUN: 31 mg/dL — ABNORMAL HIGH (ref 8–23)
CO2: 24 mmol/L (ref 22–32)
Calcium: 9.2 mg/dL (ref 8.9–10.3)
Chloride: 102 mmol/L (ref 98–111)
Creatinine, Ser: 1.47 mg/dL — ABNORMAL HIGH (ref 0.44–1.00)
GFR, Estimated: 40 mL/min — ABNORMAL LOW (ref >60)
Glucose, Bld: 151 mg/dL — ABNORMAL HIGH (ref 70–99)
Potassium: 4.2 mmol/L (ref 3.5–5.1)
Sodium: 136 mmol/L (ref 135–145)

## 2020-06-13 LAB — D-DIMER, QUANTITATIVE: D-Dimer, Quant: 0.66 ug/mL-FEU — ABNORMAL HIGH (ref 0.00–0.50)

## 2020-06-13 LAB — SARS CORONAVIRUS 2 (TAT 6-24 HRS): SARS Coronavirus 2: POSITIVE — AB

## 2020-06-13 LAB — TROPONIN I (HIGH SENSITIVITY)
Troponin I (High Sensitivity): 13 ng/L (ref <18)
Troponin I (High Sensitivity): 13 ng/L (ref <18)

## 2020-06-13 LAB — BRAIN NATRIURETIC PEPTIDE: B Natriuretic Peptide: 365 pg/mL — ABNORMAL HIGH (ref 0.0–100.0)

## 2020-06-13 MED ORDER — IOHEXOL 350 MG/ML SOLN
100.0000 mL | Freq: Once | INTRAVENOUS | Status: AC | PRN
  Administered 2020-06-13: 65 mL via INTRAVENOUS

## 2020-06-13 MED ORDER — IPRATROPIUM BROMIDE HFA 17 MCG/ACT IN AERS
2.0000 | INHALATION_SPRAY | Freq: Once | RESPIRATORY_TRACT | Status: AC
  Administered 2020-06-13: 2 via RESPIRATORY_TRACT
  Filled 2020-06-13: qty 12.9

## 2020-06-13 MED ORDER — ALBUTEROL SULFATE HFA 108 (90 BASE) MCG/ACT IN AERS
6.0000 | INHALATION_SPRAY | Freq: Once | RESPIRATORY_TRACT | Status: AC
  Administered 2020-06-13: 6 via RESPIRATORY_TRACT
  Filled 2020-06-13: qty 6.7

## 2020-06-13 NOTE — Discharge Instructions (Signed)
As we discussed it is important for you to follow-up with your primary care doctor as well as your cardiologist.  You may always return to the ER for any new or concerning symptoms however your work-up for shortness of breath was very reassuring today.  I have provided you with 2 inhalers today.  Only need to use these for shortness of breath.  You may use your albuterol as prescribed 1-4 puffs per use. You may use the ipratropium bromide inhaler 2 puffs 1 time if you are having shortness of breath.  As important to began physically conditioning herself.  Since you are having difficulty getting to the bathroom I recommend taking walks at least as long as the distance from her bedroom to bathroom several times throughout the day when he did not need to urinate.  This will help build some stamina.

## 2020-06-13 NOTE — ED Notes (Signed)
Patient transported to CT 

## 2020-06-13 NOTE — ED Provider Notes (Signed)
[facility] EMERGENCY DEPARTMENT Provider Note   CSN: [id number] Arrival date & time: 06/13/20  5825     History Chief Complaint  Patient presents with  . Shortness of Breath    Deborah Coffey is a 65 y.o. female.  HPI Patient is a 65 year old female with past medical history significant for coronary artery disease s/p CABG, Pneumonia, chronic bronchitis, depression, HLD, HTN, DM 2, anemia, obesity   Notes that over the past 2 months since approximately the beginning of this year she has been noticing progressive worsening shortness of breath with exertion.  She states that it has significantly worsened over the past 3 weeks and she states that she is now so short of breath at times that she has difficulty getting to the bathroom to urinate in time.  She states that she in fact has urinated on herself because of how profoundly short of breath she gets when she walks.  She was seen by cardiology this morning and sent to the ER for further evaluation.  She denies any chest pain, lightheadedness or dizziness.  She denies any urinary frequency, urgency or frequency or dysuria, hematuria.  No abdominal pains.  No significant new lower extremity edema.  She states that she always has some swelling in her feet.      Past Medical History:  Diagnosis Date  . Allergy   . Anemia   . CAD in native artery 02/2004   Mid LAD lesoin just after Large D1 --> CABG x 1 LIMA-LAD  . CAP (community acquired pneumonia) 08/07/2014  . Chronic bronchitis ([place])    "I think I get it q yr" (08/07/2014)  . Depression   . High cholesterol   . History of blood transfusion 2005   "related to OHS"   . Hypertension   . 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 competitive flow  . Type II diabetes mellitus [facility])     Patient Active Problem List   Diagnosis Date Noted  . Facial numbness 09/03/2016  . Chronic diastolic CHF (congestive heart  failure) ([place]) 09/03/2016  . Anxiety   . Eczema 01/29/2016  . History of adenomatous polyp of colon 10/01/2015  . Floaters in visual field 09/04/2015  . SOB (shortness of breath) 06/26/2015  . Screening for colon cancer 06/26/2015  . Type 2 diabetes mellitus with complication, with long-term current use of insulin ([place]) 02/27/2015  . Diabetic mononeuropathy associated with type 2 diabetes mellitus ([place]) 02/27/2015  . LLQ abdominal pain   . CAP (community acquired pneumonia) 08/07/2014  . AKI (acute kidney injury) ([place]) 08/07/2014  . Type 2 diabetes mellitus with hyperglycemia ([place]) 07/08/2014  . Preventative health care 07/08/2014  . Depression 07/08/2014  . Acute upper respiratory infection 07/08/2014  . Smoking addiction 07/08/2014  . Essential hypertension 01/10/2014  . Depression (emotion) 01/10/2014  . H/O TIA (transient ischemic attack) and stroke 01/10/2014  . Visual changes 01/02/2014  . Facial weakness 01/02/2014  . Conversion disorder 01/01/2014  . Tobacco use 06/20/2013  . Diabetic retinopathy ([place]) 04/03/2013  . Atypical chest pain 03/22/2013  . HTN (hypertension) 03/22/2013  . Essential hypertension, benign 02/14/2013  . Accelerated hypertension 09/19/2012  . Uncontrolled diabetes mellitus ([place]) 09/19/2012  . Dyslipidemia 09/19/2012  . CAD (coronary artery disease) - CABG x 1 (LIMA-LAD) 02/21/2004    Past Surgical History:  Procedure Laterality Date  . CARDIAC CATHETERIZATION  2010   Previous LAD 95% lesion -  now ~30-40%; patent LIMA with competitive flow  . CESAREAN SECTION  1977; 1989  . CHOLECYSTECTOMY  ~ 2012  . CORONARY ARTERY BYPASS GRAFT  02/2004   "CABG X1" (03/22/2013); LIMA-LAD  . TUBAL LIGATION  1989     OB History   No obstetric history on file.     Family History  Problem Relation Age of Onset  . Heart disease Mother   . Diabetes Mother   . Cancer Mother        cervical  . Diabetes Father   . Diabetes Sister   . Heart disease Sister    . Diabetes Brother   . Heart attack Brother   . Lupus Sister   . Heart failure Sister   . Hepatitis C Sister   . Heart failure Brother   . Diabetes Brother   . Hypertension Daughter   . Colon cancer Neg Hx     Social History   Tobacco Use  . Smoking status: Current Some Day Smoker    Packs/day: 0.50    Years: 42.00    Pack years: 21.00    Types: Cigarettes  . Smokeless tobacco: Never Used  Vaping Use  . Vaping Use: Never used  Substance Use Topics  . Alcohol use: Yes    Alcohol/week: 1.0 - 2.0 standard drink    Types: 1 - 2 Glasses of wine per week    Comment: 08/07/2014 "glass of wine maybe 2 times/yr"  . Drug use: No    Types: Marijuana    Comment: smoked pot years ago but not currently    Home Medications Prior to Admission medications   Medication Sig Start Date End Date Taking? Authorizing Provider  ACCU-CHEK FASTCLIX LANCETS MISC Use as directed to check blood glucose 3 times daily. E11.9 02/08/17   Tresa Garter, MD  acetaminophen (TYLENOL) 500 MG tablet Take 1,000 mg by mouth every 6 (six) hours as needed for headache (pain).     [provider]  Alcohol Swabs (B-D SINGLE USE SWABS REGULAR) PADS USE AS DIRECTED 04/13/17   Tresa Garter, MD  aspirin EC 81 MG tablet Take 81 mg by mouth daily.    [provider]  atorvastatin (LIPITOR) 10 MG tablet Take 1 tablet (10 mg total) by mouth daily. 10/04/19   Charlott Rakes, MD  Blood Glucose Monitoring Suppl (ACCU-CHEK NANO SMARTVIEW) w/Device KIT Use as directed to check blood glucose 3 times daily. E11.9 07/07/16   Tresa Garter, MD  clobetasol cream (TEMOVATE) 0.05 % APPLY TOPICALLY 2 (TWO) TIMES DAILY. 02/11/20   Charlott Rakes, MD  cyclobenzaprine (FLEXERIL) 10 MG tablet Take 1 tablet (10 mg total) by mouth 2 (two) times daily as needed for muscle spasms. 09/19/19   Domenic Moras, PA-C  diclofenac sodium (VOLTAREN) 1 % GEL APPLY 4 GRAMS TOPICALLY FOUR TIMES DAILY 10/02/18   Charlott Rakes, MD  DROPLET PEN NEEDLES 31G X 8 MM MISC USE WITH TOUJEO SOLOSTAR PEN EVERY DAY 02/11/20   Charlott Rakes, MD  ergocalciferol (DRISDOL) 1.25 MG (50000 UT) capsule Take 1 capsule (50,000 Units total) by mouth once a week. 10/04/19   Charlott Rakes, MD  ferrous sulfate 325 (65 FE) MG EC tablet Take 1 tablet (325 mg total) by mouth 2 (two) times daily. 10/04/19   Charlott Rakes, MD  furosemide (LASIX) 20 MG tablet Take 1 tablet (20 mg total) by mouth daily. 10/04/19   Charlott Rakes, MD  gabapentin (NEURONTIN) 300 MG capsule Take orally 2 caps (  600 mg) in the morning and 3 capsules (900 mg) in the evening 10/04/19   Charlott Rakes, MD  glucose blood (ACCU-CHEK SMARTVIEW) test strip Use as instructed to test blood glucose 3 times daily. E11.9 07/15/16   Tresa Garter, MD  hydrochlorothiazide (HYDRODIURIL) 25 MG tablet Take 1 tablet (25 mg total) by mouth daily. 10/04/19   Charlott Rakes, MD  ibuprofen (ADVIL) 600 MG tablet Take 1 tablet (600 mg total) by mouth every 6 (six) hours as needed. 09/19/19   Domenic Moras, PA-C  insulin glargine, 1 Unit Dial, (TOUJEO SOLOSTAR) 300 UNIT/ML Solostar Pen Inject 55 Units into the skin daily. 10/04/19 01/23/20  Charlott Rakes, MD  liraglutide (VICTOZA) 18 MG/3ML SOPN Inject 0.3 mLs (1.8 mg total) into the skin daily. 10/04/19   Charlott Rakes, MD  lisinopril (ZESTRIL) 10 MG tablet Take 1 tablet (10 mg total) by mouth daily. 10/04/19   Charlott Rakes, MD  metFORMIN (GLUCOPHAGE-XR) 500 MG 24 hr tablet Take 2 tablets (1,000 mg total) by mouth 2 (two) times daily. Patient taking differently: Take 500 mg by mouth 2 (two) times daily. 10/04/19   Charlott Rakes, MD  metoprolol tartrate (LOPRESSOR) 50 MG tablet Take 1 tablet (50 mg total) by mouth 2 (two) times daily. 10/04/19   Charlott Rakes, MD  Multiple Vitamin (MULTIVITAMIN WITH MINERALS) TABS tablet Take 1 tablet by mouth daily.    [provider]  nitroGLYCERIN (NITROSTAT) 0.4 MG SL tablet Place 1  tablet (0.4 mg total) under the tongue every 5 (five) minutes as needed for chest pain. 03/22/17   Isaiah Serge, NP  pantoprazole (PROTONIX) 40 MG tablet Take 1 tablet (40 mg total) by mouth 2 (two) times daily. 10/04/19   Charlott Rakes, MD  predniSONE (DELTASONE) 20 MG tablet Take 2 tablets (40 mg total) by mouth daily. 01/23/20   Blanchie Dessert, MD    Allergies    Ciprofloxacin  Review of Systems   Review of Systems  Constitutional: Negative for chills and fever.  HENT: Negative for congestion.   Eyes: Negative for pain.  Respiratory: Positive for shortness of breath. Negative for cough.   Cardiovascular: Negative for chest pain and leg swelling.  Gastrointestinal: Negative for abdominal pain and vomiting.  Genitourinary: Negative for dysuria.  Musculoskeletal: Negative for myalgias.  Skin: Negative for rash.  Neurological: Negative for dizziness and headaches.    Physical Exam Updated Vital Signs BP 125/60   Pulse 81   Temp 98.5 F (36.9 C) (Oral)   Resp 13   Ht 5' 7" (1.702 m)   Wt 117 kg   LMP  (LMP Unknown)   SpO2 96%   BMI 40.41 kg/m   Physical Exam Vitals and nursing note reviewed.  Constitutional:      General: She is not in acute distress.    Appearance: She is obese.     Comments: Pleasant well-appearing 65 year old.  In no acute distress.  Sitting comfortably in bed.  Able answer questions appropriately follow commands.  Mildly tachypneic however no increased work of breathing. Speaking in full sentences.  Notably patient is morbidly obese  HENT:     Head: Normocephalic and atraumatic.     Nose: Nose normal.     Mouth/Throat:     Mouth: Mucous membranes are moist.  Eyes:     General: No scleral icterus. Cardiovascular:     Rate and Rhythm: Normal rate and regular rhythm.     Pulses: Normal pulses.     Heart sounds: Normal  heart sounds.  Pulmonary:     Effort: Pulmonary effort is normal. No respiratory distress.     Breath sounds: Wheezing  present.     Comments: Faint end expiratory wheezing with full expiration.  Patient is not wheezing otherwise and there is clear air movement. Abdominal:     Palpations: Abdomen is soft.     Tenderness: There is no abdominal tenderness. There is no right CVA tenderness, left CVA tenderness, guarding or rebound.  Musculoskeletal:     Cervical back: Normal range of motion.     Right lower leg: Edema present.     Left lower leg: Edema present.     Comments: No calf tenderness  Scant barely notable lower extremity edema  Skin:    General: Skin is warm and dry.     Capillary Refill: Capillary refill takes less than 2 seconds.  Neurological:     Mental Status: She is alert. Mental status is at baseline.  Psychiatric:        Mood and Affect: Mood normal.        Behavior: Behavior normal.     ED Results / Procedures / Treatments   Labs (all labs ordered are listed, but only abnormal results are displayed) Labs Reviewed - No data to display  EKG None  Radiology No results found.  Procedures Procedures   Medications Ordered in ED Medications - No data to display  ED Course  I have reviewed the triage vital signs and the nursing notes.  Pertinent labs & imaging results that were available during my care of the patient were reviewed by me and considered in my medical decision making (see chart for details).  Patient is 65 year old female past medical history detailed in HPI presented today with dyspnea has been progressive over the past 2 months somewhat worse over the past 3 weeks.  She has had some episodic urinary incontinence which on further questioning it appears that she is just not been able to get to the restroom fast enough to urinate.  She is able to control her bladder however.  She has no back pain or saddle anesthesia.  Patient's BMP is relatively unremarkable.  Her kidney function is at baseline for her with mild CKD.  Glucose mildly elevated no other significant  abnormalities.  I-STAT CBG without any acidosis.  Anion gap within normal limits.  Troponin x2 within normal limits.  No thickened delta troponin.  Covid test pending at this time.  CBC without leukocytosis or significant anemia.  BMP only marginally elevated.  CT PE study was negative for PE IMPRESSION:  1. No definite evidence of pulmonary embolus.  2. Status post coronary bypass graft.  3. Mild cardiomegaly.  4. Several small pulmonary nodules are noted in the right lung apex,  the largest being 4 mm. These may be inflammatory in etiology, but  neoplasm cannot be excluded. No follow-up needed if patient is  low-risk (and has no known or suspected primary neoplasm).  Non-contrast chest CT can be considered in 12 months if patient is  high-risk. This recommendation follows the consensus statement:  Guidelines for Management of Incidental Pulmonary Nodules Detected  on CT Images: From the Fleischner Society 2017; Radiology 2017;  284:228-243.    Chest x-ray reviewed no acute disease.  Patient made aware of small pulmonary nodules.  She will follow-up with her primary care doctor for further direction on this.    MDM Rules/Calculators/A&P  I did discuss patient with hospitalist and their office without formal hospitalist consultation.  They are in agreement that patient does not have any indication for admission.  She has been ambulated significant distance in the hallway without desaturation or significant dyspnea. PE study was negative and her lab work is unremarkable. I did offer admission the patient and she declined. Patient given breathing treatments with MDI inhalers with some improvement.  We will discharge her home with these for comfort and dyspnea and close follow-up with her PCP she may benefit from pulmonology follow-up.  Final Clinical Impression(s) / ED Diagnoses Final diagnoses:  None    Rx / DC Orders ED Discharge Orders    None        Tedd Sias, [place] 06/13/20 1707    Breck Coons, MD 06/16/20 [phone]

## 2020-06-13 NOTE — ED Notes (Signed)
Patient transported to X-ray 

## 2020-06-13 NOTE — ED Notes (Signed)
Ambulating with pulse ox performed in room with minimal decrease in SpO2 92-94%,  however RR and HR increased to RR 34 and HR 106. Audible expiratory wheezing observed and auscultated expiratory wheezing in all lung fields, non-labored.

## 2020-06-13 NOTE — ED Triage Notes (Signed)
Pt BIB [facility] from the cardiologist c/o [facility] with exertion x2 months. Pt states she has also noticed some swelling in her feet but it will go away. Pt denies any pain at this time. Pt states she has come before but not stayed due to long wait times.

## 2020-06-13 NOTE — Progress Notes (Signed)
Cardiology Office Note:    Date:  06/13/2020   ID:  Deborah Coffey, DOB 13-Nov-1955, MRN [id number]  PCP:  Charlott Rakes, MD   [place]  Cardiologist:  Candee Furbish, MD  Advanced Practice Provider:  No care team member to display Electrophysiologist:  None       Referring MD: Charlott Rakes, MD     History of Present Illness:    Deborah Coffey is a 65 y.o. female with continued short of breath, O2 level 86 up to 91.  Was significantly short of breath walking into clinic today.  Could not hardly make it to the exam room.  Sleep. Feels tired. SOB. Hard to wake up at times. EMS called - but came back. Was unresponsive. Mouth can be slanted. Has had TIA in the past. The symptoms have been off-and-on/intermittent for quite some time.  Recently have become more severe.  She does not seem safe to return home at this point.  CABG 2005 - LIMA to LAD only  NUC stress 2014 - low risk, no ischemia  Nuclear stress test 2019 showed reduced EF but no ischemia.  Echo showed reassuring ejection fraction with grade 1 diastolic dysfunction.  Pulmonary pressures were elevated.  LIMA-LAD; patent by cath in 2010 (LAD lesion actually improved. competitive flow  Went to the emergency room in October 2021, bronchitis was treated.  Inhalers given.   Past Medical History:  Diagnosis Date  . Allergy   . Anemia   . CAD in native artery 02/2004   Mid LAD lesoin just after Large D1 --> CABG x 1 LIMA-LAD  . CAP (community acquired pneumonia) 08/07/2014  . Chronic bronchitis ([place])    "I think I get it q yr" (08/07/2014)  . Depression   . High cholesterol   . History of blood transfusion 2005   "related to [facility]"   . Hypertension   . 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 competitive flow  . Type II diabetes mellitus ([place])     Past Surgical History:  Procedure Laterality Date  . CARDIAC CATHETERIZATION  2010    Previous LAD 95% lesion - now ~30-40%; patent LIMA with competitive flow  . CESAREAN SECTION  1977; 1989  . CHOLECYSTECTOMY  ~ 2012  . CORONARY ARTERY BYPASS GRAFT  02/2004   "CABG X1" (03/22/2013); LIMA-LAD  . TUBAL LIGATION  1989    Current Medications: Current Meds  Medication Sig  . ACCU-CHEK FASTCLIX LANCETS MISC Use as directed to check blood glucose 3 times daily. E11.9  . acetaminophen (TYLENOL) 500 MG tablet Take 1,000 mg by mouth every 6 (six) hours as needed for headache (pain).   . Alcohol Swabs (B-D SINGLE USE SWABS REGULAR) PADS USE AS DIRECTED  . aspirin EC 81 MG tablet Take 81 mg by mouth daily.  Marland Kitchen atorvastatin (LIPITOR) 10 MG tablet Take 1 tablet (10 mg total) by mouth daily.  . Blood Glucose Monitoring Suppl (ACCU-CHEK NANO SMARTVIEW) w/Device KIT Use as directed to check blood glucose 3 times daily. E11.9  . clobetasol cream (TEMOVATE) 0.05 % APPLY TOPICALLY 2 (TWO) TIMES DAILY.  . cyclobenzaprine (FLEXERIL) 10 MG tablet Take 1 tablet (10 mg total) by mouth 2 (two) times daily as needed for muscle spasms.  . diclofenac sodium (VOLTAREN) 1 % GEL APPLY 4 GRAMS TOPICALLY FOUR TIMES DAILY  . DROPLET PEN NEEDLES 31G X 8 MM MISC USE WITH TOUJEO SOLOSTAR PEN EVERY  DAY  . ergocalciferol (DRISDOL) 1.25 MG (50000 UT) capsule Take 1 capsule (50,000 Units total) by mouth once a week.  . ferrous sulfate 325 (65 FE) MG EC tablet Take 1 tablet (325 mg total) by mouth 2 (two) times daily.  . furosemide (LASIX) 20 MG tablet Take 1 tablet (20 mg total) by mouth daily.  Marland Kitchen gabapentin (NEURONTIN) 300 MG capsule Take orally 2 caps (600 mg) in the morning and 3 capsules (900 mg) in the evening  . glucose blood (ACCU-CHEK SMARTVIEW) test strip Use as instructed to test blood glucose 3 times daily. E11.9  . hydrochlorothiazide (HYDRODIURIL) 25 MG tablet Take 1 tablet (25 mg total) by mouth daily.  Marland Kitchen ibuprofen (ADVIL) 600 MG tablet Take 1 tablet (600 mg total) by mouth every 6 (six) hours as  needed.  . liraglutide (VICTOZA) 18 MG/3ML SOPN Inject 0.3 mLs (1.8 mg total) into the skin daily.  Marland Kitchen lisinopril (ZESTRIL) 10 MG tablet Take 1 tablet (10 mg total) by mouth daily.  . metFORMIN (GLUCOPHAGE-XR) 500 MG 24 hr tablet Take 2 tablets (1,000 mg total) by mouth 2 (two) times daily. (Patient taking differently: Take 500 mg by mouth 2 (two) times daily.)  . metoprolol tartrate (LOPRESSOR) 50 MG tablet Take 1 tablet (50 mg total) by mouth 2 (two) times daily.  . Multiple Vitamin (MULTIVITAMIN WITH MINERALS) TABS tablet Take 1 tablet by mouth daily.  . nitroGLYCERIN (NITROSTAT) 0.4 MG SL tablet Place 1 tablet (0.4 mg total) under the tongue every 5 (five) minutes as needed for chest pain.  . pantoprazole (PROTONIX) 40 MG tablet Take 1 tablet (40 mg total) by mouth 2 (two) times daily.  . predniSONE (DELTASONE) 20 MG tablet Take 2 tablets (40 mg total) by mouth daily.     Allergies:   Ciprofloxacin   Social History   Socioeconomic History  . Marital status: Divorced    Spouse name: Not on file  . Number of children: Not on file  . Years of education: Not on file  . Highest education level: Not on file  Occupational History  . Not on file  Tobacco Use  . Smoking status: Current Some Day Smoker    Packs/day: 0.50    Years: 42.00    Pack years: 21.00    Types: Cigarettes  . Smokeless tobacco: Never Used  Vaping Use  . Vaping Use: Never used  Substance and Sexual Activity  . Alcohol use: Yes    Alcohol/week: 1.0 - 2.0 standard drink    Types: 1 - 2 Glasses of wine per week    Comment: 08/07/2014 "glass of wine maybe 2 times/yr"  . Drug use: No    Types: Marijuana    Comment: smoked pot years ago but not currently  . Sexual activity: Not Currently  Other Topics Concern  . Not on file  Social History Narrative  . Not on file   Social Determinants of Health   Financial Resource Strain: Not on file  Food Insecurity: Not on file  Transportation Needs: Not on file  Physical  Activity: Not on file  Stress: Not on file  Social Connections: Not on file     Family History: The patient's family history includes Cancer in her mother; Diabetes in her brother, brother, father, mother, and sister; Heart attack in her brother; Heart disease in her mother and sister; Heart failure in her brother and sister; Hepatitis C in her sister; Hypertension in her daughter; Lupus in her sister. There is no  history of Colon cancer.  ROS:   Please see the history of present illness.     All other systems reviewed and are negative.  EKGs/Labs/Other Studies Reviewed:    The following studies were reviewed today:  ECHO 2019:  - Left ventricle: The cavity size was normal. Wall thickness was  normal. Systolic function was normal. The estimated ejection  fraction was in the range of 55% to 60%. Wall motion was normal;  there were no regional wall motion abnormalities. Doppler  parameters are consistent with abnormal left ventricular  relaxation (grade 1 diastolic dysfunction).  - Right ventricle: The cavity size was mildly dilated.  - Atrial septum: There was redundancy of the septum, with  borderline criteria for aneurysm.  - Pulmonary arteries: Systolic pressure was mildly increased. PA  peak pressure: 45 mm Hg (S).   Impressions:   - Normal LV systolic function; mild diastolic dysfunction; mild  RVE; mild TR with mild pulmonary hypertension.   EKG: 04/09/2020-sinus rhythm 69 no other specific abnormalities.  Recent Labs: 01/23/2020: ALT 26 04/09/2020: BUN 29; Creatinine, Ser 1.44; Hemoglobin 10.9; Platelets 231; Potassium 4.5; Sodium 137  Recent Lipid Panel    Component Value Date/Time   CHOL 143 10/04/2019 0940   TRIG 112 10/04/2019 0940   HDL 56 10/04/2019 0940   CHOLHDL 2.6 10/04/2019 0940   CHOLHDL 2.2 09/02/2016 0533   VLDL 13 09/02/2016 0533   LDLCALC 67 10/04/2019 0940     Risk Assessment/Calculations:      Physical Exam:    VS:  BP  140/70 (BP Location: Left Arm, Patient Position: Sitting, Cuff Size: Normal)   Pulse 84   Ht _0  (1.702 m)   Wt 258 lb (117 kg)   LMP  (LMP Unknown)   SpO2 91%   BMI 40.41 kg/m     Wt Readings from Last 3 Encounters:  06/13/20 258 lb (117 kg)  01/23/20 240 lb (108.9 kg)  11/01/19 259 lb 12.8 oz (117.8 kg)     GEN: Appears tired, short of breath with minimal activity, eyes closed during some of interview-well nourished,  HEENT: Normal NECK: No JVD; No carotid bruits LYMPHATICS: No lymphadenopathy CARDIAC: RRR, no murmurs, rubs, gallops RESPIRATORY: I do hear wheezes bilaterally, tight, decreased air movement ABDOMEN: Soft, non-tender, non-distended MUSCULOSKELETAL:  No edema; No deformity  SKIN: Warm and dry NEUROLOGIC:  Alert and oriented x 3 PSYCHIATRIC:  Normal affect   ASSESSMENT:    1. Coronary artery disease involving native coronary artery of native heart without angina pectoris   2. Diabetes mellitus with coincident hypertension ([place])   3. Chronic diastolic CHF (congestive heart failure) ([facility])    PLAN:    In order of problems listed above:  Hypoxia, shortness of breath, bronchitis, acute on chronic diastolic heart failure CAD post CABG-LIMA to LAD 2005 Lethargy  -Oxygen saturation was 86% on arrival.  Clearly dyspneic.  Increased respiratory rate.  Appears tired. -Discussed with she and her daughter.  She has been having these episodes more and more at home in fact sometimes is unresponsive. -We will go ahead and call EMS for transport to the emergency department for further evaluation and likely hospitalist admission to work-up underlying lung disease.  May need further cardiac evaluation as well such as repeat echocardiogram and perhaps cardiac catheterization although her constellation of symptoms currently do not fit classically into angina only. -May need Lasix IV.  Secondary pulmonary hypertension -Pulmonary pressures were elevated on prior  echocardiogram, likely result of underlying  lung disease smoking bronchitis  Tobacco use -Continue to encourage cessation.  Prior TIAs -Aggressive risk factor modification  Morbid obesity -Continue to encourage weight loss.  Also contributing to her constellation of symptoms.   Medication Adjustments/Labs and Tests Ordered: Current medicines are reviewed at length with the patient today.  Concerns regarding medicines are outlined above.  No orders of the defined types were placed in this encounter.  No orders of the defined types were placed in this encounter.   There are no Patient Instructions on file for this visit.   Signed, Candee Furbish, MD  06/13/2020 9:03 AM    [facility]

## 2020-06-13 NOTE — ED Notes (Signed)
Pt response to ambulation in room along with wheezing reported to physician.

## 2020-06-13 NOTE — ED Notes (Signed)
Pt returned from CT °

## 2020-06-14 ENCOUNTER — Telehealth: Payer: Self-pay | Admitting: Adult Health

## 2020-06-14 NOTE — Telephone Encounter (Signed)
Called to discuss with patient about COVID-19 symptoms and the use of one of the available treatments for those with mild to moderate Covid symptoms and at a high risk of hospitalization.  Pt appears to qualify for outpatient treatment due to co-morbid conditions and/or a member of an at-risk group in accordance with the FDA Emergency Use Authorization.    Unable to reach pt - left vm to call hotline   Rexene Edison NP

## 2020-06-16 ENCOUNTER — Ambulatory Visit (HOSPITAL_COMMUNITY): Admission: RE | Admit: 2020-06-16 | Payer: Medicare HMO | Source: Ambulatory Visit

## 2020-06-16 ENCOUNTER — Encounter: Payer: Self-pay | Admitting: Adult Health

## 2020-06-16 ENCOUNTER — Other Ambulatory Visit: Payer: Self-pay | Admitting: Adult Health

## 2020-06-16 ENCOUNTER — Ambulatory Visit: Payer: Self-pay | Admitting: *Deleted

## 2020-06-16 DIAGNOSIS — R918 Other nonspecific abnormal finding of lung field: Secondary | ICD-10-CM

## 2020-06-16 DIAGNOSIS — U071 COVID-19: Secondary | ICD-10-CM

## 2020-06-16 NOTE — Telephone Encounter (Signed)
Patient's daughter calling for Deborah Coffey. She was seen in the ED on 06/13/20 with SOB. Was told her Covid results were negative.  Was told she she have a pulmonary consult due to CT Chest results. However, she is Covid positive and the infusion clinic has reached out and left a message to call. I provided this information to the daughter as well as isolation information and healthcare precautions while having Covid. Daughter will call infusion clinic at this time.  She also would like to continue with a pulmonology referral, please. Routing to physician to review request.

## 2020-06-16 NOTE — Progress Notes (Signed)
I connected by phone with [name] on 06/16/2020 at 11:16 AM to discuss the potential use of a new treatment for mild to moderate COVID-19 viral infection in non-hospitalized patients.  This patient is a 65 y.o. female that meets the FDA criteria for Emergency Use Authorization of COVID monoclonal antibody sotrovimab.  Has a (+) direct SARS-CoV-2 viral test result  Has mild or moderate COVID-19   Is NOT hospitalized due to COVID-19  Is within 10 days of symptom onset  Has at least one of the high risk factor(s) for progression to severe COVID-19 and/or hospitalization as defined in EUA.  Specific high risk criteria : BMI > 25, Chronic Kidney Disease (CKD), Diabetes and Cardiovascular disease or hypertension   Sx onset 06/13/2020, unvaccinated   I have spoken and communicated the following to the patient or parent/caregiver regarding COVID monoclonal antibody treatment:  1. FDA has authorized the emergency use for the treatment of mild to moderate COVID-19 in adults and pediatric patients with positive results of direct SARS-CoV-2 viral testing who are 6 years of age and older weighing at least 40 kg, and who are at high risk for progressing to severe COVID-19 and/or hospitalization.  2. The significant known and potential risks and benefits of COVID monoclonal antibody, and the extent to which such potential risks and benefits are unknown.  3. Information on available alternative treatments and the risks and benefits of those alternatives, including clinical trials.  4. Patients treated with COVID monoclonal antibody should continue to self-isolate and use infection control measures (e.g., wear mask, isolate, social distance, avoid sharing personal items, clean and disinfect "high touch" surfaces, and frequent handwashing) according to CDC guidelines.   5. The patient or parent/caregiver has the option to accept or refuse COVID monoclonal antibody treatment.  After reviewing  this information with the patient, the patient has agreed to receive one of the available covid 19 monoclonal antibodies and will be provided an appropriate fact sheet prior to infusion. Scot Dock, NP 06/16/2020 11:16 AM

## 2020-06-16 NOTE — Telephone Encounter (Signed)
Pt requesting Pulmonology referral.

## 2020-06-17 NOTE — Addendum Note (Signed)
Addended by: Charlott Rakes on: 06/17/2020 09:47 AM   Modules accepted: Orders

## 2020-06-17 NOTE — Telephone Encounter (Signed)
Referral has been placed. 

## 2020-07-02 DIAGNOSIS — Z03818 Encounter for observation for suspected exposure to other biological agents ruled out: Secondary | ICD-10-CM | POA: Diagnosis not present

## 2020-07-22 ENCOUNTER — Other Ambulatory Visit: Payer: Self-pay | Admitting: Pharmacist

## 2020-07-22 ENCOUNTER — Other Ambulatory Visit: Payer: Self-pay

## 2020-07-22 ENCOUNTER — Ambulatory Visit: Payer: Medicare HMO | Attending: Family Medicine | Admitting: Family Medicine

## 2020-07-22 ENCOUNTER — Encounter: Payer: Self-pay | Admitting: Family Medicine

## 2020-07-22 VITALS — BP 129/78 | HR 77 | Ht 65.0 in | Wt 261.0 lb

## 2020-07-22 DIAGNOSIS — E1159 Type 2 diabetes mellitus with other circulatory complications: Secondary | ICD-10-CM

## 2020-07-22 DIAGNOSIS — E785 Hyperlipidemia, unspecified: Secondary | ICD-10-CM | POA: Insufficient documentation

## 2020-07-22 DIAGNOSIS — R55 Syncope and collapse: Secondary | ICD-10-CM | POA: Insufficient documentation

## 2020-07-22 DIAGNOSIS — Z8249 Family history of ischemic heart disease and other diseases of the circulatory system: Secondary | ICD-10-CM | POA: Insufficient documentation

## 2020-07-22 DIAGNOSIS — Z833 Family history of diabetes mellitus: Secondary | ICD-10-CM | POA: Diagnosis not present

## 2020-07-22 DIAGNOSIS — Z9049 Acquired absence of other specified parts of digestive tract: Secondary | ICD-10-CM | POA: Insufficient documentation

## 2020-07-22 DIAGNOSIS — Z7982 Long term (current) use of aspirin: Secondary | ICD-10-CM | POA: Insufficient documentation

## 2020-07-22 DIAGNOSIS — Z7182 Exercise counseling: Secondary | ICD-10-CM | POA: Insufficient documentation

## 2020-07-22 DIAGNOSIS — Z7984 Long term (current) use of oral hypoglycemic drugs: Secondary | ICD-10-CM | POA: Insufficient documentation

## 2020-07-22 DIAGNOSIS — E1140 Type 2 diabetes mellitus with diabetic neuropathy, unspecified: Secondary | ICD-10-CM | POA: Insufficient documentation

## 2020-07-22 DIAGNOSIS — Z7952 Long term (current) use of systemic steroids: Secondary | ICD-10-CM | POA: Insufficient documentation

## 2020-07-22 DIAGNOSIS — I2583 Coronary atherosclerosis due to lipid rich plaque: Secondary | ICD-10-CM | POA: Insufficient documentation

## 2020-07-22 DIAGNOSIS — Z881 Allergy status to other antibiotic agents status: Secondary | ICD-10-CM | POA: Insufficient documentation

## 2020-07-22 DIAGNOSIS — Z794 Long term (current) use of insulin: Secondary | ICD-10-CM

## 2020-07-22 DIAGNOSIS — I2510 Atherosclerotic heart disease of native coronary artery without angina pectoris: Secondary | ICD-10-CM | POA: Diagnosis not present

## 2020-07-22 DIAGNOSIS — I10 Essential (primary) hypertension: Secondary | ICD-10-CM | POA: Diagnosis not present

## 2020-07-22 DIAGNOSIS — E1169 Type 2 diabetes mellitus with other specified complication: Secondary | ICD-10-CM | POA: Insufficient documentation

## 2020-07-22 DIAGNOSIS — Z713 Dietary counseling and surveillance: Secondary | ICD-10-CM | POA: Insufficient documentation

## 2020-07-22 DIAGNOSIS — Z8616 Personal history of COVID-19: Secondary | ICD-10-CM | POA: Insufficient documentation

## 2020-07-22 DIAGNOSIS — Z79899 Other long term (current) drug therapy: Secondary | ICD-10-CM | POA: Insufficient documentation

## 2020-07-22 DIAGNOSIS — Z6841 Body Mass Index (BMI) 40.0 and over, adult: Secondary | ICD-10-CM | POA: Diagnosis not present

## 2020-07-22 DIAGNOSIS — Z951 Presence of aortocoronary bypass graft: Secondary | ICD-10-CM | POA: Diagnosis not present

## 2020-07-22 LAB — POCT GLYCOSYLATED HEMOGLOBIN (HGB A1C): Hemoglobin A1C: 9.1 % — AB (ref 4.0–5.6)

## 2020-07-22 LAB — GLUCOSE, POCT (MANUAL RESULT ENTRY): POC Glucose: 210 mg/dl — AB (ref 70–99)

## 2020-07-22 MED ORDER — VICTOZA 18 MG/3ML ~~LOC~~ SOPN
1.8000 mg | PEN_INJECTOR | Freq: Every day | SUBCUTANEOUS | 1 refills | Status: DC
Start: 2020-07-22 — End: 2021-01-21

## 2020-07-22 MED ORDER — METFORMIN HCL ER 500 MG PO TB24: 1000.0000 mg | ORAL_TABLET | Freq: Two times a day (BID) | ORAL | 1 refills | Status: DC

## 2020-07-22 MED ORDER — DAPAGLIFLOZIN PROPANEDIOL 5 MG PO TABS: 5.0000 mg | ORAL_TABLET | Freq: Every day | ORAL | 1 refills | Status: DC

## 2020-07-22 MED ORDER — PANTOPRAZOLE SODIUM 40 MG PO TBEC: 40.0000 mg | DELAYED_RELEASE_TABLET | Freq: Two times a day (BID) | ORAL | 1 refills | Status: DC

## 2020-07-22 MED ORDER — ATORVASTATIN CALCIUM 10 MG PO TABS: 10.0000 mg | ORAL_TABLET | Freq: Every day | ORAL | 1 refills | Status: DC

## 2020-07-22 MED ORDER — HYDROCHLOROTHIAZIDE 25 MG PO TABS: 25.0000 mg | ORAL_TABLET | Freq: Every day | ORAL | 1 refills | Status: DC

## 2020-07-22 MED ORDER — LISINOPRIL 10 MG PO TABS: 10.0000 mg | ORAL_TABLET | Freq: Every day | ORAL | 1 refills | Status: DC

## 2020-07-22 MED ORDER — METOPROLOL TARTRATE 50 MG PO TABS: 50.0000 mg | ORAL_TABLET | Freq: Two times a day (BID) | ORAL | 1 refills | Status: DC

## 2020-07-22 MED ORDER — TOUJEO SOLOSTAR 300 UNIT/ML ~~LOC~~ SOPN
55.0000 [IU] | PEN_INJECTOR | Freq: Every day | SUBCUTANEOUS | 6 refills | Status: DC
Start: 2020-07-22 — End: 2021-01-21

## 2020-07-22 MED ORDER — FUROSEMIDE 20 MG PO TABS: 20.0000 mg | ORAL_TABLET | Freq: Every day | ORAL | 1 refills | Status: DC

## 2020-07-22 NOTE — Progress Notes (Signed)
Subjective:  Patient ID: Deborah Coffey, female    DOB: 28-May-1955  Age: 65 y.o. 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 here for a follow-up visit accompanied by her daughter . She gets dyspneic on walking from her room to the bathroom Daughter states she passes out spontaneously and daughter has to call EMS. She regains consciousness a few seconds later. Patient states she feels something going all the way through her body then she feels like she will pass out and if she waits for a while then it goes away. Patient states this occurs 'on the inside of her'. Last episode occurred last week. Denies presence of hypoglycemia, chest pain, seizures. Next visit with Cardiology is not until next month.  She was hospitalized for COVID in 2 /2022 and states she has no residual symptoms A1c is 9.1 up from 6.7 previously She quit Victoza because it was expensive. Compliant with her other Diabetic medications. Doing well on her antihpyertensive and statin. Past Medical History:  Diagnosis Date  . Allergy   . Anemia   . CAD in native artery 02/2004   Mid LAD lesoin just after Large D1 --> CABG x 1 LIMA-LAD  . CAP (community acquired pneumonia) 08/07/2014  . Chronic bronchitis ([place])    "I think I get it q yr" (08/07/2014)  . Depression   . High cholesterol   . History of blood transfusion 2005   "related to [facility]"   . Hypertension   . 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 competitive flow  . Type II diabetes mellitus ([place])     Past Surgical History:  Procedure Laterality Date  . CARDIAC CATHETERIZATION  2010   Previous LAD 95% lesion - now ~30-40%; patent LIMA with competitive flow  . CESAREAN SECTION  1977; 1989  . CHOLECYSTECTOMY  ~ 2012  . CORONARY ARTERY BYPASS GRAFT  02/2004   "CABG X1"  (03/22/2013); LIMA-LAD  . TUBAL LIGATION  1989    Family History  Problem Relation Age of Onset  . Heart disease Mother   . Diabetes Mother   . Cancer Mother        cervical  . Diabetes Father   . Diabetes Sister   . Heart disease Sister   . Diabetes Brother   . Heart attack Brother   . Lupus Sister   . Heart failure Sister   . Hepatitis C Sister   . Heart failure Brother   . Diabetes Brother   . Hypertension Daughter   . Colon cancer Neg Hx     Allergies  Allergen Reactions  . Ciprofloxacin Rash    Outpatient Medications Prior to Visit  Medication Sig Dispense Refill  . ACCU-CHEK FASTCLIX LANCETS MISC Use as directed to check blood glucose 3 times daily. E11.9 100 each 5  . acetaminophen (TYLENOL) 500 MG tablet Take 1,000 mg by mouth every 6 (six) hours as needed for headache (pain).     . Alcohol Swabs (B-D SINGLE USE SWABS REGULAR) PADS USE AS DIRECTED 100 each 0  . aspirin EC 81 MG tablet Take 81 mg by mouth daily.    Marland Kitchen atorvastatin (LIPITOR) 10 MG tablet Take 1 tablet (10 mg total) by mouth daily. 90 tablet 1  . Blood Glucose Monitoring Suppl (ACCU-CHEK NANO SMARTVIEW) w/Device KIT Use as  directed to check blood glucose 3 times daily. E11.9 1 kit 0  . clobetasol cream (TEMOVATE) 0.05 % APPLY TOPICALLY 2 (TWO) TIMES DAILY. 60 g 0  . cyclobenzaprine (FLEXERIL) 10 MG tablet Take 1 tablet (10 mg total) by mouth 2 (two) times daily as needed for muscle spasms. 20 tablet 0  . diclofenac sodium (VOLTAREN) 1 % GEL APPLY 4 GRAMS TOPICALLY FOUR TIMES DAILY 400 g 0  . DROPLET PEN NEEDLES 31G X 8 MM MISC USE WITH TOUJEO SOLOSTAR PEN EVERY DAY 100 each 4  . ergocalciferol (DRISDOL) 1.25 MG (50000 UT) capsule Take 1 capsule (50,000 Units total) by mouth once a week. 12 capsule 0  . ferrous sulfate 325 (65 FE) MG EC tablet Take 1 tablet (325 mg total) by mouth 2 (two) times daily. 60 tablet 6  . furosemide (LASIX) 20 MG tablet Take 1 tablet (20 mg total) by mouth daily. 90 tablet 1   . gabapentin (NEURONTIN) 300 MG capsule Take orally 2 caps (600 mg) in the morning and 3 capsules (900 mg) in the evening 450 capsule 1  . glucose blood (ACCU-CHEK SMARTVIEW) test strip Use as instructed to test blood glucose 3 times daily. E11.9 100 each 12  . hydrochlorothiazide (HYDRODIURIL) 25 MG tablet Take 1 tablet (25 mg total) by mouth daily. 90 tablet 3  . lisinopril (ZESTRIL) 10 MG tablet Take 1 tablet (10 mg total) by mouth daily. 90 tablet 1  . metFORMIN (GLUCOPHAGE-XR) 500 MG 24 hr tablet Take 2 tablets (1,000 mg total) by mouth 2 (two) times daily. (Patient taking differently: Take 500 mg by mouth 2 (two) times daily.) 360 tablet 1  . metoprolol tartrate (LOPRESSOR) 50 MG tablet Take 1 tablet (50 mg total) by mouth 2 (two) times daily. 180 tablet 1  . Multiple Vitamin (MULTIVITAMIN WITH MINERALS) TABS tablet Take 1 tablet by mouth daily.    . nitroGLYCERIN (NITROSTAT) 0.4 MG SL tablet Place 1 tablet (0.4 mg total) under the tongue every 5 (five) minutes as needed for chest pain. 25 tablet 3  . pantoprazole (PROTONIX) 40 MG tablet Take 1 tablet (40 mg total) by mouth 2 (two) times daily. 180 tablet 1  . predniSONE (DELTASONE) 20 MG tablet Take 2 tablets (40 mg total) by mouth daily. 10 tablet 0  . ibuprofen (ADVIL) 600 MG tablet Take 1 tablet (600 mg total) by mouth every 6 (six) hours as needed. (Patient not taking: Reported on 07/22/2020) 30 tablet 0  . insulin glargine, 1 Unit Dial, (TOUJEO SOLOSTAR) 300 UNIT/ML Solostar Pen Inject 55 Units into the skin daily. 30 mL 6  . liraglutide (VICTOZA) 18 MG/3ML SOPN Inject 0.3 mLs (1.8 mg total) into the skin daily. (Patient not taking: Reported on 07/22/2020) 3 pen 6   No facility-administered medications prior to visit.     ROS Review of Systems  Constitutional: Negative for activity change, appetite change and fatigue.  HENT: Negative for congestion, sinus pressure and sore throat.   Eyes: Negative for visual disturbance.   Respiratory: Positive for shortness of breath. Negative for cough, chest tightness and wheezing.   Cardiovascular: Negative for chest pain and palpitations.  Gastrointestinal: Negative for abdominal distention, abdominal pain and constipation.  Endocrine: Negative for polydipsia.  Genitourinary: Negative for dysuria and frequency.  Musculoskeletal: Negative for arthralgias and back pain.  Skin: Negative for rash.  Neurological: Negative for tremors, light-headedness and numbness.  Hematological: Does not bruise/bleed easily.  Psychiatric/Behavioral: Negative for agitation and behavioral problems.  Objective:  BP 129/78   Pulse 77   Ht $R'5\' 5"'Pb$  (1.651 m)   Wt 261 lb (118.4 kg)   LMP  (LMP Unknown)   SpO2 95%   BMI 43.43 kg/m   BP/Weight 07/22/2020 06/13/2020 07/13/7122  Systolic BP 580 998 338  Diastolic BP 78 62 70  Wt. (Lbs) 261 258 258  BMI 43.43 40.41 40.41      Physical Exam Constitutional:      Appearance: She is well-developed.  Neck:     Vascular: No JVD.  Cardiovascular:     Rate and Rhythm: Normal rate.     Heart sounds: Normal heart sounds. No murmur heard.   Pulmonary:     Effort: Pulmonary effort is normal.     Breath sounds: Normal breath sounds. No wheezing or rales.  Chest:     Chest wall: No tenderness.  Abdominal:     General: Bowel sounds are normal. There is no distension.     Palpations: Abdomen is soft. There is no mass.     Tenderness: There is no abdominal tenderness.  Musculoskeletal:        General: Normal range of motion.     Right lower leg: No edema.     Left lower leg: No edema.  Neurological:     Mental Status: She is alert and oriented to person, place, and time.  Psychiatric:        Mood and Affect: Mood normal.     CMP Latest Ref Rng & Units 06/13/2020 06/13/2020 04/09/2020  Glucose 70 - 99 mg/dL - 151(H) 243(H)  BUN 8 - 23 mg/dL - 31(H) 29(H)  Creatinine 0.44 - 1.00 mg/dL - 1.47(H) 1.44(H)  Sodium 135 - 145 mmol/L 139 136  137  Potassium 3.5 - 5.1 mmol/L 4.3 4.2 4.5  Chloride 98 - 111 mmol/L - 102 103  CO2 22 - 32 mmol/L - 24 24  Calcium 8.9 - 10.3 mg/dL - 9.2 9.1  Total Protein 6.5 - 8.1 g/dL - - -  Total Bilirubin 0.3 - 1.2 mg/dL - - -  Alkaline Phos 38 - 126 U/L - - -  AST 15 - 41 U/L - - -  ALT 0 - 44 U/L - - -    Lipid Panel     Component Value Date/Time   CHOL 143 10/04/2019 0940   TRIG 112 10/04/2019 0940   HDL 56 10/04/2019 0940   CHOLHDL 2.6 10/04/2019 0940   CHOLHDL 2.2 09/02/2016 0533   VLDL 13 09/02/2016 0533   LDLCALC 67 10/04/2019 0940    CBC    Component Value Date/Time   WBC 4.0 06/13/2020 1042   RBC 4.25 06/13/2020 1042   HGB 12.9 06/13/2020 1108   HGB 9.4 (L) 02/12/2019 1015   HCT 38.0 06/13/2020 1108   HCT 30.8 (L) 02/12/2019 1015   PLT 234 06/13/2020 1042   PLT 309 02/12/2019 1015   MCV 87.5 06/13/2020 1042   MCV 83 02/12/2019 1015   MCH 26.4 06/13/2020 1042   MCHC 30.1 06/13/2020 1042   RDW 16.4 (H) 06/13/2020 1042   RDW 16.6 (H) 02/12/2019 1015   LYMPHSABS 1.6 01/23/2020 1749   MONOABS 0.4 01/23/2020 1749   EOSABS 0.2 01/23/2020 1749   BASOSABS 0.0 01/23/2020 1749    Lab Results  Component Value Date   HGBA1C 9.1 (A) 07/22/2020    Assessment & Plan:   1. Type 2 diabetes mellitus with other circulatory complication, with long-term current use of insulin (HCC)  Uncontrolled with A1c of 9.1; goal is <7.0 She has been without Victoza I have spoken with the Pharmacist who verifies that Victoza needs to be sent to [facility] mail order Pharm[facility] alone to ensure coverage and I have done so Commenced on Farxiga Counseled on Diabetic diet, my plate method, 749 minutes of moderate intensity exercise/week Blood sugar logs with fasting goals of 80-120 mg/dl, random of less than 180 and in the event of sugars less than 60 mg/dl or greater than 400 mg/dl encouraged to notify the clinic. Advised on the need for annual eye exams, annual foot exams, Pneumonia vaccine. - POCT  glucose (manual entry) - POCT glycosylated hemoglobin (Hb A1C) - dapagliflozin propanediol (FARXIGA) 5 MG TABS tablet; Take 1 tablet (5 mg total) by mouth daily before breakfast.  Dispense: 90 tablet; Refill: 1 - liraglutide (VICTOZA) 18 MG/3ML SOPN; Inject 1.8 mg into the skin daily.  Dispense: 27 mL; Refill: 1  2. Hyperlipidemia associated with type 2 diabetes mellitus (HCC) Controlled Low cholesterol diet - atorvastatin (LIPITOR) 10 MG tablet; Take 1 tablet (10 mg total) by mouth daily.  Dispense: 90 tablet; Refill: 1 - insulin glargine, 1 Unit Dial, (TOUJEO SOLOSTAR) 300 UNIT/ML Solostar Pen; Inject 55 Units into the skin daily.  Dispense: 30 mL; Refill: 6 - metFORMIN (GLUCOPHAGE-XR) 500 MG 24 hr tablet; Take 2 tablets (1,000 mg total) by mouth 2 (two) times daily.  Dispense: 360 tablet; Refill: 1  3. Essential hypertension Controlled Counseled on blood pressure goal of less than 130/80, low-sodium, DASH diet, medication compliance, 150 minutes of moderate intensity exercise per week. Discussed medication compliance, adverse effects. - hydrochlorothiazide (HYDRODIURIL) 25 MG tablet; Take 1 tablet (25 mg total) by mouth daily.  Dispense: 90 tablet; Refill: 1 - lisinopril (ZESTRIL) 10 MG tablet; Take 1 tablet (10 mg total) by mouth daily.  Dispense: 90 tablet; Refill: 1 - metoprolol tartrate (LOPRESSOR) 50 MG tablet; Take 1 tablet (50 mg total) by mouth 2 (two) times daily.  Dispense: 180 tablet; Refill: 1 - pantoprazole (PROTONIX) 40 MG tablet; Take 1 tablet (40 mg total) by mouth 2 (two) times daily.  Dispense: 180 tablet; Refill: 1  4. Syncope, unspecified syncope type EKG today unchanged from previous with old ST depression in anterior leads Will evaluate with carotid doppler Low suspicion for seizures Consider sick sinus syndrome Advised to call Cardiologist to schedule appointment - VAS US CAROTID; Future  5. Coronary artery disease due to lipid rich plaque Asymptomatic Risk  factor modification    No orders of the defined types were placed in this encounter.   Return in about 3 months (around 10/21/2020) for medical conditions.       Charlott Rakes, MD, FAAFP. [facility] [place], [place]   07/22/2020, 1:59 PM

## 2020-07-22 NOTE — Progress Notes (Signed)
SOB when walking short distance Passing out

## 2020-07-22 NOTE — Patient Instructions (Signed)
Syncope Syncope is when you pass out (faint) for a short time. It is caused by a sudden decrease in blood flow to the brain. Signs that you may be about to pass out include:  Feeling dizzy or light-headed.  Feeling sick to your stomach (nauseous).  Seeing all white or all black.  Having cold, clammy skin. If you pass out, get help right away. Call your local emergency services (911 in the [place] Do not drive yourself to the hospital. Follow these instructions at home: Watch for any changes in your symptoms. Take these actions to stay safe and help with your symptoms: Lifestyle  Do not drive, use machinery, or play sports until your doctor says it is okay.  Do not drink alcohol.  Do not use any products that contain nicotine or tobacco, such as cigarettes and e-cigarettes. If you need help quitting, ask your doctor.  Drink enough fluid to keep your pee (urine) pale yellow. General instructions  Take over-the-counter and prescription medicines only as told by your doctor.  If you are taking blood pressure or heart medicine, sit up and stand up slowly. Spend a few minutes getting ready to sit and then stand. This can help you feel less dizzy.  Have someone stay with you until you feel stable.  If you start to feel like you might pass out, lie down right away and raise (elevate) your feet above the level of your heart. Breathe deeply and steadily. Wait until all of the symptoms are gone.  Keep all follow-up visits as told by your doctor. This is important. Get help right away if:  You have a very bad headache.  You pass out once or more than once.  You have pain in your chest, belly, or back.  You have a very fast or uneven heartbeat (palpitations).  It hurts to breathe.  You are bleeding from your mouth or your bottom (rectum).  You have black or tarry poop (stool).  You have jerky movements that you cannot control (seizure).  You are confused.  You have trouble  walking.  You are very weak.  You have vision problems. These symptoms may be an emergency. Do not wait to see if the symptoms will go away. Get medical help right away. Call your local emergency services (911 in the [place] Do not drive yourself to the hospital. Summary  Syncope is when you pass out (faint) for a short time. It is caused by a sudden decrease in blood flow to the brain.  Signs that you may be about to faint include feeling dizzy, light-headed, or sick to your stomach, seeing all white or all black, or having cold, clammy skin.  If you start to feel like you might pass out, lie down right away and raise (elevate) your feet above the level of your heart. Breathe deeply and steadily. Wait until all of the symptoms are gone. This information is not intended to replace advice given to you by your health care provider. Make sure you discuss any questions you have with your health care provider. Document Revised: 05/16/2019 Document Reviewed: 05/18/2017 Elsevier Patient Education  2021 [employer].

## 2020-07-28 ENCOUNTER — Ambulatory Visit (HOSPITAL_COMMUNITY): Admission: RE | Admit: 2020-07-28 | Payer: Medicare HMO | Source: Ambulatory Visit

## 2020-07-31 ENCOUNTER — Ambulatory Visit (HOSPITAL_COMMUNITY)
Admission: RE | Admit: 2020-07-31 | Discharge: 2020-07-31 | Disposition: A | Discharge status: Home / Self Care | Payer: Medicare HMO | Source: Ambulatory Visit | Attending: Family Medicine | Admitting: Family Medicine

## 2020-07-31 ENCOUNTER — Other Ambulatory Visit: Payer: Self-pay

## 2020-07-31 DIAGNOSIS — R55 Syncope and collapse: Secondary | ICD-10-CM | POA: Insufficient documentation

## 2020-07-31 NOTE — Progress Notes (Signed)
Carotid artery duplex has been completed. Preliminary results can be found in CV Proc through chart review.   07/31/20 1:22 PM Deborah Coffey RVT

## 2020-08-04 ENCOUNTER — Telehealth: Payer: Self-pay

## 2020-08-04 NOTE — Telephone Encounter (Signed)
-----   Message from Charlott Rakes, MD sent at 08/04/2020  4:03 PM EDT ----- Please inform her that her carotid Doppler does not reveal presence of narrowing of her carotid arteries.

## 2020-08-04 NOTE — Telephone Encounter (Signed)
Patient was called and a voicemail was left informing patient to return phone call for lab results.   CRM created.

## 2020-08-08 ENCOUNTER — Telehealth: Payer: Self-pay

## 2020-08-08 NOTE — Telephone Encounter (Signed)
Patient name and DOB has been verified Patient was informed of lab results. Patient had no questions.  

## 2020-08-08 NOTE — Telephone Encounter (Signed)
-----   Message from Enobong Newlin, MD sent at 08/04/2020  4:03 PM EDT ----- Please inform her that her carotid Doppler does not reveal presence of narrowing of her carotid arteries. 

## 2020-09-09 ENCOUNTER — Other Ambulatory Visit: Payer: Self-pay

## 2020-10-23 ENCOUNTER — Ambulatory Visit: Payer: Medicare HMO | Admitting: Family Medicine

## 2020-11-16 ENCOUNTER — Emergency Department (HOSPITAL_COMMUNITY)
Admission: EM | Admit: 2020-11-16 | Discharge: 2020-11-16 | Disposition: A | Discharge status: Home / Self Care | Payer: Medicare HMO | Attending: Emergency Medicine | Admitting: Emergency Medicine

## 2020-11-16 ENCOUNTER — Encounter (HOSPITAL_COMMUNITY): Payer: Self-pay | Admitting: Emergency Medicine

## 2020-11-16 ENCOUNTER — Emergency Department (HOSPITAL_COMMUNITY): Payer: Medicare HMO

## 2020-11-16 ENCOUNTER — Other Ambulatory Visit: Payer: Self-pay

## 2020-11-16 DIAGNOSIS — I110 Hypertensive heart disease with heart failure: Secondary | ICD-10-CM | POA: Insufficient documentation

## 2020-11-16 DIAGNOSIS — I2510 Atherosclerotic heart disease of native coronary artery without angina pectoris: Secondary | ICD-10-CM | POA: Diagnosis not present

## 2020-11-16 DIAGNOSIS — E11319 Type 2 diabetes mellitus with unspecified diabetic retinopathy without macular edema: Secondary | ICD-10-CM | POA: Diagnosis not present

## 2020-11-16 DIAGNOSIS — F17210 Nicotine dependence, cigarettes, uncomplicated: Secondary | ICD-10-CM | POA: Insufficient documentation

## 2020-11-16 DIAGNOSIS — Z7982 Long term (current) use of aspirin: Secondary | ICD-10-CM | POA: Insufficient documentation

## 2020-11-16 DIAGNOSIS — Z7984 Long term (current) use of oral hypoglycemic drugs: Secondary | ICD-10-CM | POA: Diagnosis not present

## 2020-11-16 DIAGNOSIS — W228XXA Striking against or struck by other objects, initial encounter: Secondary | ICD-10-CM | POA: Diagnosis not present

## 2020-11-16 DIAGNOSIS — I5032 Chronic diastolic (congestive) heart failure: Secondary | ICD-10-CM | POA: Insufficient documentation

## 2020-11-16 DIAGNOSIS — Z951 Presence of aortocoronary bypass graft: Secondary | ICD-10-CM | POA: Insufficient documentation

## 2020-11-16 DIAGNOSIS — R600 Localized edema: Secondary | ICD-10-CM | POA: Diagnosis not present

## 2020-11-16 DIAGNOSIS — M7731 Calcaneal spur, right foot: Secondary | ICD-10-CM | POA: Diagnosis not present

## 2020-11-16 DIAGNOSIS — M79671 Pain in right foot: Secondary | ICD-10-CM | POA: Diagnosis not present

## 2020-11-16 DIAGNOSIS — Z79899 Other long term (current) drug therapy: Secondary | ICD-10-CM | POA: Insufficient documentation

## 2020-11-16 DIAGNOSIS — Z794 Long term (current) use of insulin: Secondary | ICD-10-CM | POA: Insufficient documentation

## 2020-11-16 DIAGNOSIS — M7989 Other specified soft tissue disorders: Secondary | ICD-10-CM | POA: Diagnosis not present

## 2020-11-16 DIAGNOSIS — M19071 Primary osteoarthritis, right ankle and foot: Secondary | ICD-10-CM | POA: Diagnosis not present

## 2020-11-16 MED ORDER — ACETAMINOPHEN 325 MG PO TABS
650.0000 mg | ORAL_TABLET | Freq: Once | ORAL | Status: AC
  Administered 2020-11-16: 650 mg via ORAL
  Filled 2020-11-16: qty 2

## 2020-11-16 NOTE — Progress Notes (Signed)
Orthopedic Tech Progress Note Patient Details:  Deborah Coffey 02/15/1956 [id number]  Ortho Devices Type of Ortho Device: Postop shoe/boot, Buddy tape Ortho Device/Splint Location: rle 4th and 5th toes Ortho Device/Splint Interventions: Ordered, Application, Adjustment   Post Interventions Patient Tolerated: Well Instructions Provided: Care of device, Adjustment of device  Karolee Stamps 11/16/2020, 8:21 PM
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
## 2020-11-16 NOTE — ED Notes (Signed)
Patient verbalizes understanding of discharge instructions. Follow-up care reviewed. Opportunity for questioning and answers were provided. Armband removed by staff, pt discharged from ED via wheelchair.  

## 2020-11-16 NOTE — ED Triage Notes (Signed)
C/o R foot and ankle swelling since yesterday.  Reports hitting R little toe on bedpost 1 week ago.  Denies any other injury.

## 2020-11-16 NOTE — Discharge Instructions (Addendum)
X-ray shows a possible fracture in your pinky toe.  Wear the buddy tape for comfort and use the postop shoe.  You can try buying a cain at any store like we discussed.  Continue to elevate your foot to help with swelling and take Tylenol for pain.  Follow-up with on-call Ortho doctor Dr. Tamera Punt.  Call his office tomorrow to schedule follow-up appointment.

## 2020-11-16 NOTE — ED Notes (Signed)
Patient transported to X-ray 

## 2020-11-16 NOTE — ED Provider Notes (Signed)
Emergency Medicine Provider Triage Evaluation Note  Deborah Coffey , a 65 y.o. female  was evaluated in triage.  Pt complains of R foot pain and swelling.  Pt states she hit her foot 1 week ago. No pain or swelling until yesterday. Has taken tylenol. No other injury.   Review of Systems  Positive: Foot pain and swelling Negative: numbness  Physical Exam  BP (!) 144/76 (BP Location: Left Arm)   Pulse 75   Temp 98.5 F (36.9 C) (Oral)   Resp 17   LMP  (LMP Unknown)   SpO2 97%  Gen:   Awake, no distress   Resp:  Normal effort  MSK:   R foot and ankle diffusely swollen. Good pedal pulse bilaterally. No erythema. Temperate equal bilaterally. No calf pain or swelling   Medical Decision Making  Medically screening exam initiated at 4:54 PM.  Appropriate orders placed.  Deborah Coffey was informed that the remainder of the evaluation will be completed by another provider, this initial triage assessment does not replace that evaluation, and the importance of remaining in the ED until their evaluation is complete.  Deborah Poe, PA-C 11/16/20 1655    Wyvonnia Dusky, MD 11/17/20 [phone]

## 2020-11-19 DIAGNOSIS — M79674 Pain in right toe(s): Secondary | ICD-10-CM | POA: Diagnosis not present

## 2020-11-19 DIAGNOSIS — M79671 Pain in right foot: Secondary | ICD-10-CM | POA: Diagnosis not present

## 2020-12-17 DIAGNOSIS — M79671 Pain in right foot: Secondary | ICD-10-CM | POA: Diagnosis not present

## 2020-12-27 ENCOUNTER — Telehealth: Payer: Self-pay | Admitting: Family Medicine

## 2020-12-27 NOTE — Telephone Encounter (Signed)
Tried calling patient to schedule Medicare Annual Wellness Visit (AWV) either virtually or in office.  No answer   awvi 06/17/17 per [facility]   please schedule at anytime with health coach

## 2020-12-29 ENCOUNTER — Ambulatory Visit: Payer: Medicare HMO | Admitting: Family Medicine

## 2021-01-19 DIAGNOSIS — M19071 Primary osteoarthritis, right ankle and foot: Secondary | ICD-10-CM | POA: Diagnosis not present

## 2021-01-21 ENCOUNTER — Other Ambulatory Visit: Payer: Self-pay

## 2021-01-21 ENCOUNTER — Ambulatory Visit: Payer: Medicare HMO | Attending: Family Medicine | Admitting: Physician Assistant

## 2021-01-21 VITALS — BP 133/66 | HR 71 | Resp 16 | Wt 243.8 lb

## 2021-01-21 DIAGNOSIS — E1165 Type 2 diabetes mellitus with hyperglycemia: Secondary | ICD-10-CM

## 2021-01-21 DIAGNOSIS — I2583 Coronary atherosclerosis due to lipid rich plaque: Secondary | ICD-10-CM

## 2021-01-21 DIAGNOSIS — D649 Anemia, unspecified: Secondary | ICD-10-CM | POA: Diagnosis not present

## 2021-01-21 DIAGNOSIS — E785 Hyperlipidemia, unspecified: Secondary | ICD-10-CM

## 2021-01-21 DIAGNOSIS — E559 Vitamin D deficiency, unspecified: Secondary | ICD-10-CM | POA: Diagnosis not present

## 2021-01-21 DIAGNOSIS — I5032 Chronic diastolic (congestive) heart failure: Secondary | ICD-10-CM | POA: Diagnosis not present

## 2021-01-21 DIAGNOSIS — E1159 Type 2 diabetes mellitus with other circulatory complications: Secondary | ICD-10-CM

## 2021-01-21 DIAGNOSIS — E1169 Type 2 diabetes mellitus with other specified complication: Secondary | ICD-10-CM | POA: Diagnosis not present

## 2021-01-21 DIAGNOSIS — I2510 Atherosclerotic heart disease of native coronary artery without angina pectoris: Secondary | ICD-10-CM | POA: Diagnosis not present

## 2021-01-21 DIAGNOSIS — Z794 Long term (current) use of insulin: Secondary | ICD-10-CM

## 2021-01-21 DIAGNOSIS — E1142 Type 2 diabetes mellitus with diabetic polyneuropathy: Secondary | ICD-10-CM

## 2021-01-21 DIAGNOSIS — I10 Essential (primary) hypertension: Secondary | ICD-10-CM | POA: Diagnosis not present

## 2021-01-21 LAB — POCT GLYCOSYLATED HEMOGLOBIN (HGB A1C): HbA1c, POC (controlled diabetic range): 9.4 % — AB (ref 0.0–7.0)

## 2021-01-21 LAB — GLUCOSE, POCT (MANUAL RESULT ENTRY): POC Glucose: 210 mg/dl — AB (ref 70–99)

## 2021-01-21 MED ORDER — METFORMIN HCL ER 500 MG PO TB24: 1000.0000 mg | ORAL_TABLET | Freq: Two times a day (BID) | ORAL | 1 refills | Status: DC

## 2021-01-21 MED ORDER — METOPROLOL TARTRATE 50 MG PO TABS
50.0000 mg | ORAL_TABLET | Freq: Two times a day (BID) | ORAL | 1 refills | Status: DC
Start: 2021-01-21 — End: 2021-08-31
  Filled 2021-05-01: qty 180, 90d supply, fill #0

## 2021-01-21 MED ORDER — FUROSEMIDE 20 MG PO TABS: 20.0000 mg | ORAL_TABLET | Freq: Every day | ORAL | 1 refills | Status: DC

## 2021-01-21 MED ORDER — ATORVASTATIN CALCIUM 10 MG PO TABS: 10.0000 mg | ORAL_TABLET | Freq: Every day | ORAL | 1 refills | Status: DC

## 2021-01-21 MED ORDER — LISINOPRIL 10 MG PO TABS: 10.0000 mg | ORAL_TABLET | Freq: Every day | ORAL | 1 refills | Status: DC

## 2021-01-21 MED ORDER — DROPLET PEN NEEDLES 31G X 8 MM MISC: 4 refills | Status: DC

## 2021-01-21 MED ORDER — GABAPENTIN 300 MG PO CAPS: ORAL_CAPSULE | ORAL | 1 refills | Status: DC

## 2021-01-21 MED ORDER — DAPAGLIFLOZIN PROPANEDIOL 5 MG PO TABS: 5.0000 mg | ORAL_TABLET | Freq: Every day | ORAL | 1 refills | Status: DC

## 2021-01-21 MED ORDER — HYDROCHLOROTHIAZIDE 25 MG PO TABS: 25.0000 mg | ORAL_TABLET | Freq: Every day | ORAL | 1 refills | Status: DC

## 2021-01-21 MED ORDER — TOUJEO SOLOSTAR 300 UNIT/ML ~~LOC~~ SOPN: 60.0000 [IU] | PEN_INJECTOR | Freq: Every day | SUBCUTANEOUS | 6 refills | Status: DC

## 2021-01-21 NOTE — Patient Instructions (Signed)
Diabetes Mellitus and Nutrition, Adult When you have diabetes, or diabetes mellitus, it is very important to have healthy eating habits because your blood sugar (glucose) levels are greatly affected by what you eat and drink. Eating healthy foods in the right amounts, at about the same times every day, can help you:  Control your blood glucose.  Lower your risk of heart disease.  Improve your blood pressure.  Reach or maintain a healthy weight. What can affect my meal plan? Every person with diabetes is different, and each person has different needs for a meal plan. Your health care provider may recommend that you work with a dietitian to make a meal plan that is best for you. Your meal plan may vary depending on factors such as:  The calories you need.  The medicines you take.  Your weight.  Your blood glucose, blood pressure, and cholesterol levels.  Your activity level.  Other health conditions you have, such as heart or kidney disease. How do carbohydrates affect me? Carbohydrates, also called carbs, affect your blood glucose level more than any other type of food. Eating carbs naturally raises the amount of glucose in your blood. Carb counting is a method for keeping track of how many carbs you eat. Counting carbs is important to keep your blood glucose at a healthy level, especially if you use insulin or take certain oral diabetes medicines. It is important to know how many carbs you can safely have in each meal. This is different for every person. Your dietitian can help you calculate how many carbs you should have at each meal and for each snack. How does alcohol affect me? Alcohol can cause a sudden decrease in blood glucose (hypoglycemia), especially if you use insulin or take certain oral diabetes medicines. Hypoglycemia can be a life-threatening condition. Symptoms of hypoglycemia, such as sleepiness, dizziness, and confusion, are similar to symptoms of having too much  alcohol.  Do not drink alcohol if: ? Your health care provider tells you not to drink. ? You are pregnant, may be pregnant, or are planning to become pregnant.  If you drink alcohol: ? Do not drink on an empty stomach. ? Limit how much you use to:  0-1 drink a day for women.  0-2 drinks a day for men. ? Be aware of how much alcohol is in your drink. In the [place] one drink equals one 12 oz bottle of beer (355 mL), one 5 oz glass of wine (148 mL), or one 1 oz glass of hard liquor (44 mL). ? Keep yourself hydrated with water, diet soda, or unsweetened iced tea.  Keep in mind that regular soda, juice, and other mixers may contain a lot of sugar and must be counted as carbs. What are tips for following this plan? Reading food labels  Start by checking the serving size on the "Nutrition Facts" label of packaged foods and drinks. The amount of calories, carbs, fats, and other nutrients listed on the label is based on one serving of the item. Many items contain more than one serving per package.  Check the total grams (g) of carbs in one serving. You can calculate the number of servings of carbs in one serving by dividing the total carbs by 15. For example, if a food has 30 g of total carbs per serving, it would be equal to 2 servings of carbs.  Check the number of grams (g) of saturated fats and trans fats in one serving. Choose foods that have   a low amount or none of these fats.  Check the number of milligrams (mg) of salt (sodium) in one serving. Most people should limit total sodium intake to less than 2,300 mg per day.  Always check the nutrition information of foods labeled as "low-fat" or "nonfat." These foods may be higher in added sugar or refined carbs and should be avoided.  Talk to your dietitian to identify your daily goals for nutrients listed on the label. Shopping  Avoid buying canned, pre-made, or processed foods. These foods tend to be high in fat, sodium, and added  sugar.  Shop around the outside edge of the grocery store. This is where you will most often find fresh fruits and vegetables, bulk grains, fresh meats, and fresh dairy. Cooking  Use low-heat cooking methods, such as baking, instead of high-heat cooking methods like deep frying.  Cook using healthy oils, such as olive, canola, or sunflower oil.  Avoid cooking with butter, cream, or high-fat meats. Meal planning  Eat meals and snacks regularly, preferably at the same times every day. Avoid going long periods of time without eating.  Eat foods that are high in fiber, such as fresh fruits, vegetables, beans, and whole grains. Talk with your dietitian about how many servings of carbs you can eat at each meal.  Eat 4-6 oz (112-168 g) of lean protein each day, such as lean meat, chicken, fish, eggs, or tofu. One ounce (oz) of lean protein is equal to: ? 1 oz (28 g) of meat, chicken, or fish. ? 1 egg. ?  cup (62 g) of tofu.  Eat some foods each day that contain healthy fats, such as avocado, nuts, seeds, and fish.   What foods should I eat? Fruits Berries. Apples. Oranges. Peaches. Apricots. Plums. Grapes. Mango. Papaya. Pomegranate. Kiwi. Cherries. Vegetables Lettuce. Spinach. Leafy greens, including kale, chard, collard greens, and mustard greens. Beets. Cauliflower. Cabbage. Broccoli. Carrots. Green beans. Tomatoes. Peppers. Onions. Cucumbers. Brussels sprouts. Grains Whole grains, such as whole-wheat or whole-grain bread, crackers, tortillas, cereal, and pasta. Unsweetened oatmeal. Quinoa. Brown or wild rice. Meats and other proteins Seafood. Poultry without skin. Lean cuts of poultry and beef. Tofu. Nuts. Seeds. Dairy Low-fat or fat-free dairy products such as milk, yogurt, and cheese. The items listed above may not be a complete list of foods and beverages you can eat. Contact a dietitian for more information. What foods should I avoid? Fruits Fruits canned with  syrup. Vegetables Canned vegetables. Frozen vegetables with butter or cream sauce. Grains Refined white flour and flour products such as bread, pasta, snack foods, and cereals. Avoid all processed foods. Meats and other proteins Fatty cuts of meat. Poultry with skin. Breaded or fried meats. Processed meat. Avoid saturated fats. Dairy Full-fat yogurt, cheese, or milk. Beverages Sweetened drinks, such as soda or iced tea. The items listed above may not be a complete list of foods and beverages you should avoid. Contact a dietitian for more information. Questions to ask a health care provider  Do I need to meet with a diabetes educator?  Do I need to meet with a dietitian?  What number can I call if I have questions?  When are the best times to check my blood glucose? Where to find more information:  American Diabetes Association: diabetes.org  Academy of Nutrition and Dietetics: www.eatright.org  National Institute of Diabetes and Digestive and Kidney Diseases: www.niddk.nih.gov  Association of Diabetes Care and Education Specialists: www.diabeteseducator.org Summary  It is important to have healthy eating   habits because your blood sugar (glucose) levels are greatly affected by what you eat and drink.  A healthy meal plan will help you control your blood glucose and maintain a healthy lifestyle.  Your health care provider may recommend that you work with a dietitian to make a meal plan that is best for you.  Keep in mind that carbohydrates (carbs) and alcohol have immediate effects on your blood glucose levels. It is important to count carbs and to use alcohol carefully. This information is not intended to replace advice given to you by your health care provider. Make sure you discuss any questions you have with your health care provider. Document Revised: 03/13/2019 Document Reviewed: 03/13/2019 Elsevier Patient Education  2021 [employer]vier Inc.  

## 2021-01-21 NOTE — Progress Notes (Signed)
Deborah Coffey, is a 65 y.o. female  WPY:[id number]  KNL:[id number]  DOB - 1955/09/06  Chief Complaint  Patient presents with   Hypertension   Diabetes       Subjective:   Deborah Coffey is a 65 y.o. female here today for a follow up visit for DM.  She says she does not know how to eat diabetic diet.  She does not check her blood sugars.  She never went back to cardiology after last visit.  S/p CABG 2005.  Denies SOB or CP.  Compliant with meds but never got victoza filled   Patient has No headache, No chest pain, No abdominal pain - No Nausea, No new weakness tingling or numbness, No Cough - SOB.  No problems updated.  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 competitive flow   Type II diabetes mellitus ([place])     MEDICATIONS AT HOME: Prior to Admission medications   Medication Sig Start Date End Date Taking? Authorizing Provider  ACCU-CHEK FASTCLIX LANCETS MISC Use as directed to check blood glucose 3 times daily. E11.9 02/08/17   Tresa Garter, MD  acetaminophen (TYLENOL) 500 MG tablet Take 1,000 mg by mouth every 6 (six) hours as needed for headache (pain).     [provider]  Alcohol Swabs (B-D SINGLE USE SWABS REGULAR) PADS USE AS DIRECTED 04/13/17   Tresa Garter, MD  aspirin EC 81 MG tablet Take 81 mg by mouth daily.    [provider]  atorvastatin (LIPITOR) 10 MG tablet Take 1 tablet (10 mg total) by mouth daily.  01/21/21   Argentina Donovan, PA-C  Blood Glucose Monitoring Suppl (ACCU-CHEK NANO SMARTVIEW) w/Device KIT Use as directed to check blood glucose 3 times daily. E11.9 07/07/16   Tresa Garter, MD  clobetasol cream (TEMOVATE) 0.05 % APPLY TOPICALLY 2 (TWO) TIMES DAILY. 02/11/20   Charlott Rakes, MD  cyclobenzaprine (FLEXERIL) 10 MG tablet Take 1 tablet (10 mg total) by mouth 2 (two) times daily as needed for muscle spasms. 09/19/19   Domenic Moras, PA-C  dapagliflozin propanediol (FARXIGA) 5 MG TABS tablet Take 1 tablet (5 mg total) by mouth daily before breakfast. 01/21/21   Argentina Donovan, PA-C  diclofenac sodium (VOLTAREN) 1 % GEL APPLY 4 GRAMS TOPICALLY FOUR TIMES DAILY 10/02/18   Charlott Rakes, MD  ergocalciferol (DRISDOL) 1.25 MG (50000 UT) capsule Take 1 capsule (50,000 Units total) by mouth once a week. 10/04/19   Charlott Rakes, MD  ferrous sulfate 325 (65 FE) MG EC tablet Take 1 tablet (325 mg total) by mouth 2 (two) times daily. 10/04/19   Charlott Rakes, MD  furosemide (LASIX) 20 MG tablet Take 1 tablet (20 mg total) by mouth daily. 01/21/21   Argentina Donovan, PA-C  gabapentin (NEURONTIN) 300 MG capsule Take orally 2 caps (600 mg) in the  morning and 3 capsules (900 mg) in the evening 01/21/21   Freeman Caldron M, PA-C  glucose blood (ACCU-CHEK SMARTVIEW) test strip Use as instructed to test blood glucose 3 times daily. E11.9 07/15/16   Tresa Garter, MD  hydrochlorothiazide (HYDRODIURIL) 25 MG tablet Take 1 tablet (25 mg total) by mouth daily. 01/21/21   Argentina Donovan, PA-C  ibuprofen (ADVIL) 600 MG tablet Take 1 tablet (600 mg total) by mouth every 6 (six) hours as needed. Patient not taking: Reported on 07/22/2020 09/19/19   Domenic Moras, PA-C  insulin glargine, 1 Unit Dial, (TOUJEO SOLOSTAR) 300 UNIT/ML Solostar Pen Inject 60 Units into the skin daily. 01/21/21 02/20/21  Argentina Donovan, PA-C  Insulin Pen Needle (DROPLET PEN NEEDLES) 31G X 8 MM MISC USE WITH TOUJEO SOLOSTAR PEN  EVERY DAY 01/21/21   Freeman Caldron M, PA-C  lisinopril (ZESTRIL) 10 MG tablet Take 1 tablet (10 mg total) by mouth daily. 01/21/21   Argentina Donovan, PA-C  metFORMIN (GLUCOPHAGE-XR) 500 MG 24 hr tablet Take 2 tablets (1,000 mg total) by mouth 2 (two) times daily. 01/21/21   Argentina Donovan, PA-C  metoprolol tartrate (LOPRESSOR) 50 MG tablet Take 1 tablet (50 mg total) by mouth 2 (two) times daily. 01/21/21   Argentina Donovan, PA-C  Multiple Vitamin (MULTIVITAMIN WITH MINERALS) TABS tablet Take 1 tablet by mouth daily.    [provider]  nitroGLYCERIN (NITROSTAT) 0.4 MG SL tablet Place 1 tablet (0.4 mg total) under the tongue every 5 (five) minutes as needed for chest pain. 03/22/17   Isaiah Serge, NP  pantoprazole (PROTONIX) 40 MG tablet Take 1 tablet (40 mg total) by mouth 2 (two) times daily. 07/22/20   Charlott Rakes, MD    ROS: Neg HEENT Neg resp Neg cardiac Neg GI Neg GU Neg MS Neg psych Neg neuro  Objective:   Vitals:   01/21/21 1603  BP: 133/66  Pulse: 71  Resp: 16  SpO2: 97%  Weight: 243 lb 12.8 oz (110.6 kg)   Exam General appearance : Awake, alert, not in any distress. Speech Clear. Not toxic looking I/J-fair HEENT: Atraumatic and Normocephalic, pupils equally reactive to light and accomodation Neck: Supple, no JVD. No cervical lymphadenopathy.  Chest: Good air entry bilaterally, CTAB.  No rales/rhonchi/wheezing CVS: S1 S2 regular, no murmurs.  Extremities: B/L Lower Ext shows no edema, both legs are warm to touch Neurology: Awake alert, and oriented X 3, CN II-XII intact, Non focal Skin: No Rash  Data Review Lab Results  Component Value Date   HGBA1C 9.4 (A) 01/21/2021   HGBA1C 9.1 (A) 07/22/2020   HGBA1C 6.7 10/04/2019    Assessment & Plan   1. Type 2 diabetes mellitus with other circulatory complication, with long-term current use of insulin ([place]) Lengthy discussion about diabetic diet.  She needs to focus her diet around lean meats and  non-starchy veggies.  She says she eats a lot of pasta and rice. I will increase her toujeo from 55 to 60 untis dail and she PROMISES to work on diet - Glucose (CBG) - HgB A1c - Comprehensive metabolic panel - dapagliflozin propanediol (FARXIGA) 5 MG TABS tablet; Take 1 tablet (5 mg total) by mouth daily before breakfast.  Dispense: 90 tablet; Refill: 1 - insulin glargine, 1 Unit Dial, (TOUJEO SOLOSTAR) 300 UNIT/ML Solostar Pen; Inject 60 Units into the skin daily.  Dispense: 30 mL; Refill: 6 - metFORMIN (GLUCOPHAGE-XR) 500 MG 24 hr tablet; Take 2 tablets (1,000 mg  total) by mouth 2 (two) times daily.  Dispense: 360 tablet; Refill: 1 2. Hyperlipidemia associated with type 2 diabetes mellitus (HCC) - Comprehensive metabolic panel - CBC with Differential/Platelet - Lipid panel - atorvastatin (LIPITOR) 10 MG tablet; Take 1 tablet (10 mg total) by mouth daily.  Dispense: 90 tablet; Refill: 1 - insulin glargine, 1 Unit Dial, (TOUJEO SOLOSTAR) 300 UNIT/ML Solostar Pen; Inject 60 Units into the skin daily.  Dispense: 30 mL; Refill: 6 - metFORMIN (GLUCOPHAGE-XR) 500 MG 24 hr tablet; Take 2 tablets (1,000 mg total) by mouth 2 (two) times daily.  Dispense: 360 tablet; Refill: 1 - gabapentin (NEURONTIN) 300 MG capsule; Take orally 2 caps (600 mg) in the morning and 3 capsules (900 mg) in the evening  Dispense: 450 capsule; Refill: 1  3. Essential hypertension controlled - Comprehensive metabolic panel - hydrochlorothiazide (HYDRODIURIL) 25 MG tablet; Take 1 tablet (25 mg total) by mouth daily.  Dispense: 90 tablet; Refill: 1 - lisinopril (ZESTRIL) 10 MG tablet; Take 1 tablet (10 mg total) by mouth daily.  Dispense: 90 tablet; Refill: 1 - metoprolol tartrate (LOPRESSOR) 50 MG tablet; Take 1 tablet (50 mg total) by mouth 2 (two) times daily.  Dispense: 180 tablet; Refill: 1 - furosemide (LASIX) 20 MG tablet; Take 1 tablet (20 mg total) by mouth daily.  Dispense: 90 tablet; Refill: 1  4. Normocytic  anemia - Comprehensive metabolic panel - CBC with Differential/Platelet  5. Coronary artery disease due to lipid rich plaque - Ambulatory referral to Cardiology  6. Vitamin D deficiency - Vitamin D, 25-hydroxy  7. Chronic diastolic CHF (congestive heart failure) ([place]) - Ambulatory referral to Cardiology - metoprolol tartrate (LOPRESSOR) 50 MG tablet; Take 1 tablet (50 mg total) by mouth 2 (two) times daily.  Dispense: 180 tablet; Refill: 1 - furosemide (LASIX) 20 MG tablet; Take 1 tablet (20 mg total) by mouth daily.  Dispense: 90 tablet; Refill: 1  8. Uncontrolled type 2 diabetes mellitus with hyperglycemia (HCC) - Insulin Pen Needle (DROPLET PEN NEEDLES) 31G X 8 MM MISC; USE WITH TOUJEO SOLOSTAR PEN EVERY DAY  Dispense: 100 each; Refill: 4  9. Diabetic polyneuropathy associated with type 2 diabetes mellitus (HCC) - gabapentin (NEURONTIN) 300 MG capsule; Take orally 2 caps (600 mg) in the morning and 3 capsules (900 mg) in the evening  Dispense: 450 capsule; Refill: 1    Patient have been counseled extensively about nutrition and exercise. Other issues discussed during this visit include: low cholesterol diet, weight control and daily exercise, foot care, annual eye examinations at Ophthalmology, importance of adherence with medications and regular follow-up. We also discussed long term complications of uncontrolled diabetes and hypertension.   Return for 3 months with PCP.  The patient was given clear instructions to go to ER or return to medical center if symptoms don't improve, worsen or new problems develop. The patient verbalized understanding. The patient was told to call to get lab results if they haven't heard anything in the next week.      Freeman Caldron, PA-C [facility] and [place], [place]   01/21/2021, 4:14 PM Patient ID: Deborah Coffey, female   DOB: [birth year], 65 y.o.   MRN: [id number]

## 2021-01-22 ENCOUNTER — Other Ambulatory Visit: Payer: Self-pay | Admitting: Physician Assistant

## 2021-01-22 LAB — LIPID PANEL
Chol/HDL Ratio: 2.2 ratio (ref 0.0–4.4)
Cholesterol, Total: 161 mg/dL (ref 100–199)
HDL: 72 mg/dL (ref >39)
LDL Chol Calc (NIH): 70 mg/dL (ref 0–99)
Triglycerides: 104 mg/dL (ref 0–149)
VLDL Cholesterol Cal: 19 mg/dL (ref 5–40)

## 2021-01-22 LAB — CBC WITH DIFFERENTIAL/PLATELET
Basophils Absolute: 0 10*3/uL (ref 0.0–0.2)
Basos: 0 %
EOS (ABSOLUTE): 0.1 10*3/uL (ref 0.0–0.4)
Eos: 2 %
Hematocrit: 36.3 % (ref 34.0–46.6)
Hemoglobin: 11.5 g/dL (ref 11.1–15.9)
Immature Grans (Abs): 0 10*3/uL (ref 0.0–0.1)
Immature Granulocytes: 0 %
Lymphocytes Absolute: 1.9 10*3/uL (ref 0.7–3.1)
Lymphs: 27 %
MCH: 27.2 pg (ref 26.6–33.0)
MCHC: 31.7 g/dL (ref 31.5–35.7)
MCV: 86 fL (ref 79–97)
Monocytes Absolute: 0.5 10*3/uL (ref 0.1–0.9)
Monocytes: 7 %
Neutrophils Absolute: 4.4 10*3/uL (ref 1.4–7.0)
Neutrophils: 64 %
Platelets: 278 10*3/uL (ref 150–450)
RBC: 4.23 x10E6/uL (ref 3.77–5.28)
RDW: 14.4 % (ref 11.7–15.4)
WBC: 7 10*3/uL (ref 3.4–10.8)

## 2021-01-22 LAB — COMPREHENSIVE METABOLIC PANEL
ALT: 16 IU/L (ref 0–32)
AST: 13 IU/L (ref 0–40)
Albumin/Globulin Ratio: 1.2 (ref 1.2–2.2)
Albumin: 3.8 g/dL (ref 3.8–4.8)
Alkaline Phosphatase: 106 IU/L (ref 44–121)
BUN/Creatinine Ratio: 16 (ref 12–28)
BUN: 23 mg/dL (ref 8–27)
Bilirubin Total: <0.2 mg/dL (ref 0.0–1.2)
CO2: 22 mmol/L (ref 20–29)
Calcium: 9.6 mg/dL (ref 8.7–10.3)
Chloride: 104 mmol/L (ref 96–106)
Creatinine, Ser: 1.46 mg/dL — ABNORMAL HIGH (ref 0.57–1.00)
Globulin, Total: 3.1 g/dL (ref 1.5–4.5)
Glucose: 226 mg/dL — ABNORMAL HIGH (ref 70–99)
Potassium: 4.5 mmol/L (ref 3.5–5.2)
Sodium: 142 mmol/L (ref 134–144)
Total Protein: 6.9 g/dL (ref 6.0–8.5)
eGFR: 40 mL/min/{1.73_m2} — ABNORMAL LOW (ref >59)

## 2021-01-22 LAB — VITAMIN D 25 HYDROXY (VIT D DEFICIENCY, FRACTURES): Vit D, 25-Hydroxy: 17.9 ng/mL — ABNORMAL LOW (ref 30.0–100.0)

## 2021-01-22 MED ORDER — ERGOCALCIFEROL 1.25 MG (50000 UT) PO CAPS: 50000.0000 [IU] | ORAL_CAPSULE | ORAL | 0 refills | Status: DC

## 2021-02-04 ENCOUNTER — Telehealth: Payer: Self-pay | Admitting: *Deleted

## 2021-02-04 ENCOUNTER — Ambulatory Visit: Payer: Medicare HMO | Admitting: Cardiology

## 2021-02-04 NOTE — Telephone Encounter (Signed)
Reviewed lab results and physician's note with the patient. Discussed lowering her intake of sugary foods along with decreasing carbohydrate intake. Patient stated she has a follow-up appointment in January.

## 2021-02-24 ENCOUNTER — Encounter: Payer: Self-pay | Admitting: Physician Assistant

## 2021-02-24 NOTE — Progress Notes (Addendum)
Cardiology Office Note    Date:  02/27/2021   ID:  Deborah Coffey, DOB [birth year], MRN [id number]  PCP:  Charlott Rakes, MD  Cardiologist:  Candee Furbish, MD  Electrophysiologist:  None   Chief Complaint: f/u CAD, syncope, shortness of breath  History of Present Illness:   Deborah Coffey is a 65 y.o. female with history of CAD s/p CABGx1 (LIMA-LAD) 9323, chronic diastolic CHF, anemia, depression, HLD, HTN, DM, CKD stage 3b, prior TIAs, pulmonary HTN (felt due to underlying lung disease/smoking), morbid obesity, mild carotid disease, lung nodules by CT 05/2020 who presents for overdue follow-up.  She had prior bypass in 2005 with last cath 2010 showing patent LIMA, 40-50% mRCA, normal LCX, normal aortoiliac system. Last nuc 02/2018 was felt to be low risk without ischemia. EF was 42% but visually appeared 60-65%. 2D echo done to confirm showed EF 55-60%, grade 1DD, mild pulm HTN, redundancy of the atrial septum, with borderline criteria for aneurysm.   At last OV 05/2020 with Dr. Marlou Porch she had arrived to clinic lethargic and hypoxic so was sent to the ED. CT angio showed no definite PE, several small pulmonary nodules (consider f/u 12 months), mild cardiomegaly. She was treated with breathing treatments with improvement and discharged home per ED notes. It looks like Covid test was pending per ED note. Per follow-up phone notes her family noted she was Covid positive when this resulted and she was set up for the infusion clinic for therapy but appt listed as No-Show. She ultimately saw her PCP in follow-up in 07/2020 at which time she had reported 2 episodes of syncope around the time she'd had Covid. One was while she was standing in the kitchen, states she could hear her daughter calling her name, not sure if she fully passed out. The second episode happened while she was straightening her bed, thinks she was out for a brief period of time, cannot really recall further details as it was  so far back. Carotid duplex 07/2020 showed 1-39% BICA. She cancelled prior cardiology follow-up before today.  She is seen back today and overall she feels she is doing well except she has felt more dyspnea on exertion ever since she had Covid. No chest pain, edema, orthopnea, PND, palpitations or recurrent syncope. She has been steadily losing weight which she says has been due to diet changes. We also discussed her CT scan from 05/2020 and the commentary on the pulmonary nodules.   Labwork independently reviewed: 01/2021 LDL 70, trig 104, HDL 72, CBC wnl, K 4.5, Cr 1.46, LFTs wnl, A1c 9.4 2020 TSH wnl  Past Medical History:  Diagnosis Date   Allergy    Anemia    CAD in native artery 02/18/2004   Mid LAD lesoin just after Large D1 --> CABG x 1 LIMA-LAD   CAP (community acquired pneumonia) 08/07/2014   Chronic kidney disease, stage 3b ([place])    Depression    High cholesterol    History of blood transfusion 04/20/2003   "related to OHS"    Hypertension    Lung nodules    Mild carotid artery disease ([place])    Pulmonary hypertension ([place])    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 competitive flow   TIA (transient ischemic attack)    Type II diabetes mellitus ([place])     Past Surgical History:  Procedure Laterality Date   CARDIAC CATHETERIZATION  2010   Previous LAD 95%  lesion - now ~30-40%; patent LIMA with competitive flow   CESAREAN SECTION  1977; 1989   CHOLECYSTECTOMY  ~ 2012   CORONARY ARTERY BYPASS GRAFT  02/2004   "CABG X1" (03/22/2013); LIMA-LAD   TUBAL LIGATION  1989    Current Medications: Current Meds  Medication Sig   ACCU-CHEK FASTCLIX LANCETS MISC Use as directed to check blood glucose 3 times daily. E11.9   acetaminophen (TYLENOL) 500 MG tablet Take 1,000 mg by mouth every 6 (six) hours as needed for headache (pain).    Alcohol Swabs (B-D SINGLE USE SWABS REGULAR) PADS USE AS DIRECTED   aspirin EC 81 MG tablet  Take 81 mg by mouth daily.   atorvastatin (LIPITOR) 10 MG tablet Take 1 tablet (10 mg total) by mouth daily.   Blood Glucose Monitoring Suppl (ACCU-CHEK NANO SMARTVIEW) w/Device KIT Use as directed to check blood glucose 3 times daily. E11.9   clobetasol cream (TEMOVATE) 0.05 % APPLY TOPICALLY 2 (TWO) TIMES DAILY.   cyclobenzaprine (FLEXERIL) 10 MG tablet Take 1 tablet (10 mg total) by mouth 2 (two) times daily as needed for muscle spasms.   dapagliflozin propanediol (FARXIGA) 5 MG TABS tablet Take 1 tablet (5 mg total) by mouth daily before breakfast.   furosemide (LASIX) 20 MG tablet Take 1 tablet (20 mg total) by mouth daily.   gabapentin (NEURONTIN) 300 MG capsule Take orally 2 caps (600 mg) in the morning and 3 capsules (900 mg) in the evening   glucose blood (ACCU-CHEK SMARTVIEW) test strip Use as instructed to test blood glucose 3 times daily. E11.9   hydrochlorothiazide (HYDRODIURIL) 25 MG tablet Take 1 tablet (25 mg total) by mouth daily.   Insulin Pen Needle (DROPLET PEN NEEDLES) 31G X 8 MM MISC USE WITH TOUJEO SOLOSTAR PEN EVERY DAY   lisinopril (ZESTRIL) 10 MG tablet Take 1 tablet (10 mg total) by mouth daily.   metFORMIN (GLUCOPHAGE-XR) 500 MG 24 hr tablet Take 2 tablets (1,000 mg total) by mouth 2 (two) times daily.   metoprolol tartrate (LOPRESSOR) 50 MG tablet Take 1 tablet (50 mg total) by mouth 2 (two) times daily.   Multiple Vitamin (MULTIVITAMIN WITH MINERALS) TABS tablet Take 1 tablet by mouth daily.   nitroGLYCERIN (NITROSTAT) 0.4 MG SL tablet Place 1 tablet (0.4 mg total) under the tongue every 5 (five) minutes as needed for chest pain.   pantoprazole (PROTONIX) 40 MG tablet Take 1 tablet (40 mg total) by mouth 2 (two) times daily.     Allergies:   Ciprofloxacin   Social History   Socioeconomic History   Marital status: Divorced    Spouse name: Not on file   Number of children: Not on file   Years of education: Not on file   Highest education level: Not on file   Occupational History   Not on file  Tobacco Use   Smoking status: Some Days    Packs/day: 0.50    Years: 42.00    Pack years: 21.00    Types: Cigarettes   Smokeless tobacco: Never  Vaping Use   Vaping Use: Never used  Substance and Sexual Activity   Alcohol use: Yes    Alcohol/week: 1.0 - 2.0 standard drink    Types: 1 - 2 Glasses of wine per week    Comment: 08/07/2014 "glass of wine maybe 2 times/yr"   Drug use: No    Types: Marijuana    Comment: smoked pot years ago but not currently   Sexual activity: Not Currently  Other Topics Concern   Not on file  Social History Narrative   Not on file   Social Determinants of Health   Financial Resource Strain: Not on file  Food Insecurity: Not on file  Transportation Needs: Not on file  Physical Activity: Not on file  Stress: Not on file  Social Connections: Not on file     Family History:  The patient's family history includes Cancer in her mother; Diabetes in her brother, brother, father, mother, and sister; Heart attack in her brother; Heart disease in her mother and sister; Heart failure in her brother and sister; Hepatitis C in her sister; Hypertension in her daughter; Lupus in her sister. There is no history of Colon cancer.  ROS:   Please see the history of present illness.  All other systems are reviewed and otherwise negative.    EKGs/Labs/Other Studies Reviewed:    Studies reviewed are outlined and summarized above. Reports included below if pertinent.  Carotid duplex 07/2020 Summary:  Right Carotid: Velocities in the right ICA are consistent with a 1-39%  stenosis.   Left Carotid: Velocities in the left ICA are consistent with a 1-39%  stenosis.   Vertebrals: Bilateral vertebral arteries demonstrate antegrade flow.   *See table(s) above for measurements and observations.  Electronically signed by Servando Snare MD on 08/01/2020 at 2:28:14 PM.  Final    2D echo 02/2018 - Left ventricle: The cavity size  was normal. Wall thickness was    normal. Systolic function was normal. The estimated ejection    fraction was in the range of 55% to 60%. Wall motion was normal;    there were no regional wall motion abnormalities. Doppler    parameters are consistent with abnormal left ventricular    relaxation (grade 1 diastolic dysfunction).  - Right ventricle: The cavity size was mildly dilated.  - Atrial septum: There was redundancy of the septum, with    borderline criteria for aneurysm.  - Pulmonary arteries: Systolic pressure was mildly increased. PA    peak pressure: 45 mm Hg (S).   Impressions:   - Normal LV systolic function; mild diastolic dysfunction; mild    RVE; mild TR with mild pulmonary hypertension.   NST 02/2018 Nuclear stress EF: 42%. The visual LV EF appears to be normal is likely 60-65%. Suggest echo for further assessment of LV function There was no ST segment deviation noted during stress. The study is normal. This is a low risk study. There is no evidence of ischemia and no evidence of previous infarction   Cath 2010 INDICATIONS:  Ms. Nitika Jackowski is a 65 year old African American  female who apparently underwent single-vessel CABG revascularization  surgery in November 2005 with a LIMA to the LAD.  At that time, cardiac  catheterization was done by Dr. Rex Kras and the patient was found to have  high-grade LAD disease in the region of a large diagonal takeoff.  The  patient has a history of hypertension, diabetes mellitus, obesity, and  hyperlipidemia.  She has had recurrent episodes of somewhat atypical  chest pain.  She had been sent home with chest pain several days ago.  A  D-dimer was 1.1.  A chest CT was negative.  Troponins were negative.  She had recurrent episodes of chest pain leading to readmission to [facility] yesterday.  Because of recurrence of discomfort, she was  seen by Dr. Rockey Situ and definitive cardiac catheterization was  recommended.     PROCEDURE IN DETAIL:  After premedication with Versed 2 mg and fentanyl  50 mcg, the patient was prepped and draped in usual fashion.  A right  femoral artery was punctured anteriorly and a 5-French sheath was  inserted.  Diagnostic cardiac catheterization was done utilizing 5-  French catheters.  For optimal visualization of the left system, an FL-  3.5 catheter was necessary.  A 5-French FR-4 right catheter was used for  selective angiography in the right coronary artery.  A LIMA catheter was  used for selective angiography into the left internal mammary artery.  A  5-French pigtail catheter was used for biplane cine left  ventriculography.  With the patient's significant hypertensive history,  distal aortography was also performed to make certain that she did not  have any renovascular etiology to her hypertension.  Hemostasis was  obtained by direct manual pressure.    HEMODYNAMIC DATA:  Central aortic pressure was 120/50.  Left ventricular  pressure was 120/7.    ANGIOGRAPHIC DATA:  Left main coronary artery was a short upper takeoff  vessel which bifurcated into an LAD and left circumflex system.  The LAD  was a large vessel that gave rise to a large proximal diagonal vessel.  In the region of the diagonal takeoff of the LAD just beyond this was,  was smooth and appeared 30% narrowed.  Apparently, this was a site which  previously was reported to be 95% stenosed.  A flush and fill phenomena  was seen in the distal LAD due to the competitive filling from the LIMA  graft.    The circumflex vessel was a moderate-sized vessel that gave rise to two  marginal vessels and ended in the posterolateral vessel.  The circumflex  was angiographically normal.    The right coronary artery was a moderate-sized vessel that gave rise to  a moderate-sized PDA system and small distal RCA.  There was 40-50%  narrowing in the midportion of the right coronary artery.  Following 200  mcg of  intracoronary nitroglycerin, this did not significantly improve.    The left internal mammary artery was a large-caliber vessel proximally.  A flush and fill phenomenon was seen in the distal portion of the LIMA  graft and because of this, visualization of the distal LAD was not  visualized by the LIMA graft but was visualized by the native left  injection.    Left ventriculography revealed normal LV contractility without focal  segmental wall motion abnormalities.  There was a suggestion of mild  concentric left ventricle hypertrophy.    Distal aortography did not demonstrate any significant aortoiliac  disease.  The renal arteries were patent without significant  abnormality.    IMPRESSION:  1. Normal left ventricular function with suggestion of mild concentric      left ventricular hypertrophy.  2. Mild native coronary obstructive disease with improvement in the      previous 95% proximal left anterior descending stenosis in the      region of the diagonal takeoff which now appeared only 30% with a      flush and fill phenomena seen in the distal left anterior      descending due to competitive left internal mammary artery filling.  3. Normal left circumflex system.  4. A 40-50% narrowing in the mid right coronary artery, not      significantly improved with intracoronary nitroglycerin.  5. Patent left internal mammary artery graft but evidence for flush      and fill phenomena seen  in the distal graft without adequate      visualization of the distal left anterior descending via this graft      due to predominantly native left anterior descending filling.  6. Normal aortoiliac system.    RECOMMENDATIONS:  Medical therapy.              ______________________________  Shelva Majestic, M.D.        TK/MEDQ  D:  11/13/2008  T:  11/13/2008  Job:  [id number]    cc:   Ernestene Kiel, M.D.  Tery Sanfilippo, MD    EKG:  EKG is ordered today, personally reviewed, demonstrating  NSR 78bpm, small Q waves III, avF (? New from prior) with nonspecific ST upsloping in thees leads, also V5-V6, nonspecific TWI avL, V2  Recent Labs: 06/13/2020: B Natriuretic Peptide 365.0 01/21/2021: ALT 16; BUN 23; Creatinine, Ser 1.46; Hemoglobin 11.5; Platelets 278; Potassium 4.5; Sodium 142  Recent Lipid Panel    Component Value Date/Time   CHOL 161 01/21/2021 1618   TRIG 104 01/21/2021 1618   HDL 72 01/21/2021 1618   CHOLHDL 2.2 01/21/2021 1618   CHOLHDL 2.2 09/02/2016 0533   VLDL 13 09/02/2016 0533   LDLCALC 70 01/21/2021 1618    PHYSICAL EXAM:    VS:  BP 110/70   Pulse 78   Ht _0  (1.651 m)   Wt 239 lb 12.8 oz (108.8 kg)   LMP  (LMP Unknown)   SpO2 97%   BMI 39.90 kg/m   BMI: Body mass index is 39.9 kg/m.  GEN: Well nourished, well developed female in no acute distress HEENT: normocephalic, atraumatic Neck: no JVD, carotid bruits, or masses Cardiac: RRR; no murmurs, rubs, or gallops, no edema  Respiratory:  clear to auscultation bilaterally, normal work of breathing GI: soft, nontender, nondistended, + BS MS: no deformity or atrophy Skin: warm and dry, no rash Neuro:  Alert and Oriented x 3, Strength and sensation are intact, follows commands Psych: euthymic mood, full affect  Wt Readings from Last 3 Encounters:  02/27/21 239 lb 12.8 oz (108.8 kg)  01/21/21 243 lb 12.8 oz (110.6 kg)  07/22/20 261 lb (118.4 kg)     ASSESSMENT & PLAN:   1. Dyspnea on exertion - etiology not entirely clear, may be multifactorial in the setting of history of smoking, Covid, cannot exclude progression in CAD or pulmonary HTN. EKG reviewed as above. We will first undertake 2D echocardiogram for further evaluation. If LVEF is normal and pulmonary pressures stable, will plan Lexiscan stress test. If LVEF is down or there is evidence of progressive pulmonary HTN, will need to consider L+/-R heart catheterization. Will also update CBC, BMET, BNP and 2V CXR for completeness. I also  discussed the prior finding of pulmonary nodules on her CT scan 05/2020 (recommended to consider f/u study 12 months) and recommended she discuss further with her PCP in follow-up. She sees Dr. Margarita Rana in January.4  Preemptively contingent on echo results, I did consent the patient preliminarily for NST if we decide to proceed.  Shared Decision Making/Informed Consent The risks [chest pain, shortness of breath, cardiac arrhythmias, dizziness, blood pressure fluctuations, myocardial infarction, stroke/transient ischemic attack, nausea, vomiting, allergic reaction, radiation exposure, metallic taste sensation and life-threatening complications (estimated to be 1 in 10,000)], benefits (risk stratification, diagnosing coronary artery disease, treatment guidance) and alternatives of a nuclear stress test were discussed in detail with Ms. Delawder and she agrees to proceed.    2. CAD s/p CABG with  HLD goal LDL <70 - workup planned above. Question significance of borderline Q waves III, avF. Continue ASA, metoprolol, statin. Lipids are managed by primary care. Consideration could be given to further statin titration given known CAD but will defer to PCP who has been monitoring/prescribing.  3. Chronic diastolic CHF/mild pulmonary HTN with history of essential HTN - appears euvolemic today. Await CXR and BNP result. Her BP is trending lower than it has in the past. Recheck by me was 108/70. She has been losing weight. We will add thyroid to her labs and decrease HCTZ to 12.71m daily. We generally don't combine both thiazide and loop diuretics together but given her dyspnea I do not want to disrupt her fluid balance much further so will otherwise continue Lasix at present dose while decreasing HCTZ. Await echo as above.  4. 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  Just noted at this time.  5. History of syncope 05/2020 - per patient, occurred in the context of Covid. CTA negative for PE at that time. Carotid duplex with only mild disease. We will arrange 2D echo and 2 week Zio to exclude occult arrhythmias. If Zio is negative, continue observation for any recurrent events. If syncope recurs, consider ILR.   6. Mild carotid artery disease - consider repeat duplex in 2 years. Continue risk factor modification.    Disposition: F/u with me in 2 months.   Medication Adjustments/Labs and Tests Ordered: Current medicines are reviewed at length with the patient today.  Concerns regarding medicines are outlined above. Medication changes, Labs and Tests ordered today are summarized above and listed in the Patient Instructions accessible in Encounters.   Signed, DCharlie Pitter PA-C  02/27/2021 2:14 PM    [facility] [address] [place]  [id number]: [phone] Fax: [phone]

## 2021-02-27 ENCOUNTER — Ambulatory Visit (INDEPENDENT_AMBULATORY_CARE_PROVIDER_SITE_OTHER): Payer: Medicare HMO

## 2021-02-27 ENCOUNTER — Other Ambulatory Visit: Payer: Self-pay

## 2021-02-27 ENCOUNTER — Ambulatory Visit: Payer: Medicare HMO | Admitting: Physician Assistant

## 2021-02-27 ENCOUNTER — Encounter: Payer: Self-pay | Admitting: Physician Assistant

## 2021-02-27 ENCOUNTER — Ambulatory Visit
Admission: RE | Admit: 2021-02-27 | Discharge: 2021-02-27 | Disposition: A | Discharge status: Home / Self Care | Payer: Medicare HMO | Source: Ambulatory Visit | Attending: Physician Assistant | Admitting: Physician Assistant

## 2021-02-27 VITALS — BP 110/70 | HR 78 | Ht 65.0 in | Wt 239.8 lb

## 2021-02-27 DIAGNOSIS — I10 Essential (primary) hypertension: Secondary | ICD-10-CM | POA: Diagnosis not present

## 2021-02-27 DIAGNOSIS — I5032 Chronic diastolic (congestive) heart failure: Secondary | ICD-10-CM

## 2021-02-27 DIAGNOSIS — I272 Pulmonary hypertension, unspecified: Secondary | ICD-10-CM

## 2021-02-27 DIAGNOSIS — Z87898 Personal history of other specified conditions: Secondary | ICD-10-CM

## 2021-02-27 DIAGNOSIS — I779 Disorder of arteries and arterioles, unspecified: Secondary | ICD-10-CM | POA: Diagnosis not present

## 2021-02-27 DIAGNOSIS — I2510 Atherosclerotic heart disease of native coronary artery without angina pectoris: Secondary | ICD-10-CM

## 2021-02-27 DIAGNOSIS — R931 Abnormal findings on diagnostic imaging of heart and coronary circulation: Secondary | ICD-10-CM

## 2021-02-27 DIAGNOSIS — E785 Hyperlipidemia, unspecified: Secondary | ICD-10-CM

## 2021-02-27 DIAGNOSIS — R0609 Other forms of dyspnea: Secondary | ICD-10-CM

## 2021-02-27 MED ORDER — HYDROCHLOROTHIAZIDE 12.5 MG PO CAPS: 12.5000 mg | ORAL_CAPSULE | Freq: Every day | ORAL | 3 refills | Status: DC

## 2021-02-27 NOTE — Progress Notes (Unsigned)
Enrolled patient for a 14 day Zio XT monitor to be mailed to patients home   Dr Skains to read 

## 2021-02-27 NOTE — Patient Instructions (Addendum)
Medication Instructions:  Your physician has recommended you make the following change in your medication:   REDUCE the Hydrochlorothiazide to 12.5 mg taking 1 tablet daily.. You may cut the 25 mg tablets in 1/2 to use them up   *If you need a refill on your cardiac medications before your next appointment, please call your pharmacy*   Lab Work: TODAY:  BMET, CBC, PRO BNP, & TSH  If you have labs (blood work) drawn today and your tests are completely normal, you will receive your results only by: [place] (if you have MyChart) OR A paper copy in the mail If you have any lab test that is abnormal or we need to change your treatment, we will call you to review the results.   Testing/Procedures: Your physician has requested that you have an echocardiogram. Echocardiography is a painless test that uses sound waves to create images of your heart. It provides your doctor with information about the size and shape of your heart and how well your heart's chambers and valves are working. This procedure takes approximately one hour. There are no restrictions for this procedure.   A chest x-ray takes a picture of the organs and structures inside the chest, including the heart, lungs, and blood vessels. This test can show several things, including, whether the heart is enlarges; whether fluid is building up in the lungs; and whether pacemaker / defibrillator leads are still in place. Go to [address],  [place] [phone] Today for this chest x-ray   ZIO XT- Long Term Monitor Instructions  Your physician has requested you wear a ZIO patch monitor for 14 days.  This is a single patch monitor. [employer] supplies one patch monitor per enrollment. Additional stickers are not available. Please do not apply patch if you will be having a Nuclear Stress Test,  Echocardiogram, Cardiac CT, MRI, or Chest Xray during the period you would be wearing the  monitor. The patch  cannot be worn during these tests. You cannot remove and re-apply the  ZIO XT patch monitor.  Your ZIO patch monitor will be mailed 3 day USPS to your address on file. It may take 3-5 days  to receive your monitor after you have been enrolled.  Once you have received your monitor, please review the enclosed instructions. Your monitor  has already been registered assigning a specific monitor serial # to you.  Billing and Patient Assistance Program Information  We have supplied [employer] with any of your insurance information on file for billing purposes. [employer] offers a sliding scale Patient Assistance Program for patients that do not have  insurance, or whose insurance does not completely cover the cost of the ZIO monitor.  You must apply for the Patient Assistance Program to qualify for this discounted rate.  To apply, please call [employer] at [phone], select option 4, select option 2, ask to apply for  Patient Assistance Program. Theodore Demark will ask your household income, and how many people  are in your household. They will quote your out-of-pocket cost based on that information.  [employer] will also be able to set up a 15-month, interest-free payment plan if needed.  Applying the monitor   Shave hair from upper left chest.  Hold abrader disc by orange tab. Rub abrader in 40 strokes over the upper left chest as  indicated in your monitor instructions.  Clean area with 4 enclosed alcohol pads. Let dry.  Apply patch as indicated in monitor instructions. Patch  will be placed under collarbone on left  side of chest with arrow pointing upward.  Rub patch adhesive wings for 2 minutes. Remove white label marked "1". Remove the white  label marked "2". Rub patch adhesive wings for 2 additional minutes.  While looking in a mirror, press and release button in center of patch. A small green light will  flash 3-4 times. This will be your only indicator that the monitor has been turned on.  Do not  shower for the first 24 hours. You may shower after the first 24 hours.  Press the button if you feel a symptom. You will hear a small click. Record Date, Time and  Symptom in the Patient Logbook.  When you are ready to remove the patch, follow instructions on the last 2 pages of Patient  Logbook. Stick patch monitor onto the last page of Patient Logbook.  Place Patient Logbook in the blue and white box. Use locking tab on box and tape box closed  securely. The blue and white box has prepaid postage on it. Please place it in the mailbox as  soon as possible. Your physician should have your test results approximately 7 days after the  monitor has been mailed back to [facility].  Call [facility] at [phone] if you have questions regarding  your ZIO XT patch monitor. Call them immediately if you see an orange light blinking on your  monitor.  If your monitor falls off in less than 4 days, contact our Monitor department at [phone].  If your monitor becomes loose or falls off after 4 days call [employer] at [phone] for  suggestions on securing your monitor    Follow-Up: At [facility], you and your health needs are our priority.  As part of our continuing mission to provide you with exceptional heart care, we have created designated Provider Care Teams.  These Care Teams include your primary Cardiologist (physician) and Advanced Practice Providers (APPs -  Physician Assistants and Nurse Practitioners) who all work together to provide you with the care you need, when you need it.  We recommend signing up for the patient portal called "MyChart".  Sign up information is provided on this After Visit Summary.  MyChart is used to connect with patients for Virtual Visits (Telemedicine).  Patients are able to view lab/test results, encounter notes, upcoming appointments, etc.  Non-urgent messages can be sent to your provider as well.   To learn more about what you can  do with MyChart, go to [email or web address].    Your next appointment:   2-3 month(s)  The format for your next appointment:   In Person  Provider:   Candee Furbish, MD  or Melina Copa, PA-C         Other Instructions

## 2021-02-28 LAB — CBC
Hematocrit: 37.3 % (ref 34.0–46.6)
Hemoglobin: 11.7 g/dL (ref 11.1–15.9)
MCH: 27 pg (ref 26.6–33.0)
MCHC: 31.4 g/dL — ABNORMAL LOW (ref 31.5–35.7)
MCV: 86 fL (ref 79–97)
Platelets: 330 10*3/uL (ref 150–450)
RBC: 4.34 x10E6/uL (ref 3.77–5.28)
RDW: 14 % (ref 11.7–15.4)
WBC: 7 10*3/uL (ref 3.4–10.8)

## 2021-02-28 LAB — BASIC METABOLIC PANEL
BUN/Creatinine Ratio: 18 (ref 12–28)
BUN: 25 mg/dL (ref 8–27)
CO2: 25 mmol/L (ref 20–29)
Calcium: 9.6 mg/dL (ref 8.7–10.3)
Chloride: 105 mmol/L (ref 96–106)
Creatinine, Ser: 1.37 mg/dL — ABNORMAL HIGH (ref 0.57–1.00)
Glucose: 42 mg/dL — ABNORMAL LOW (ref 70–99)
Potassium: 4.7 mmol/L (ref 3.5–5.2)
Sodium: 142 mmol/L (ref 134–144)
eGFR: 43 mL/min/{1.73_m2} — ABNORMAL LOW (ref >59)

## 2021-02-28 LAB — PRO B NATRIURETIC PEPTIDE: NT-Pro BNP: 1633 pg/mL — ABNORMAL HIGH (ref 0–287)

## 2021-02-28 LAB — TSH: TSH: 1.51 u[IU]/mL (ref 0.450–4.500)

## 2021-03-02 ENCOUNTER — Telehealth: Payer: Self-pay | Admitting: *Deleted

## 2021-03-02 DIAGNOSIS — Z79899 Other long term (current) drug therapy: Secondary | ICD-10-CM

## 2021-03-02 MED ORDER — FUROSEMIDE 20 MG PO TABS: 40.0000 mg | ORAL_TABLET | Freq: Every day | ORAL | 3 refills | Status: DC

## 2021-03-02 NOTE — Progress Notes (Signed)
Pt has been made aware of normal result and verbalized understanding.  jw

## 2021-03-02 NOTE — Telephone Encounter (Signed)
-----   Message from Charlie Pitter, [place] sent at 03/02/2021  8:04 AM EST ----- Please let patient know labs look ok except a few abnormalities: - kidney function remains elevated but stable compared to prior - blood sugar was very low at 42 -> would suggest she call her PCP to let them know she's gotten low reading, may need adjustment in her regimen if she is seeing this at home as well (has had weight loss which can impact what doses of diabetic meds she needs) - BNP/fluid marker higher than I anticipated. Would stop HCTZ altogether and increase Lasix to 2 tablets (40mg ) daily for now with recheck BMET 1 week. If she does not have a way to follow her BP at home and submit readings in a few days on this new regimen, can we see if we can check it for her on the BMET day with a nurse visit?  Continue plan as outlined otherwise.

## 2021-03-03 DIAGNOSIS — Z87898 Personal history of other specified conditions: Secondary | ICD-10-CM | POA: Diagnosis not present

## 2021-03-10 ENCOUNTER — Other Ambulatory Visit: Payer: Self-pay

## 2021-03-10 ENCOUNTER — Other Ambulatory Visit: Payer: Medicare HMO | Admitting: *Deleted

## 2021-03-10 ENCOUNTER — Ambulatory Visit (INDEPENDENT_AMBULATORY_CARE_PROVIDER_SITE_OTHER): Payer: Medicare HMO

## 2021-03-10 VITALS — BP 130/68 | Ht 65.0 in

## 2021-03-10 DIAGNOSIS — I5032 Chronic diastolic (congestive) heart failure: Secondary | ICD-10-CM | POA: Diagnosis not present

## 2021-03-10 DIAGNOSIS — Z79899 Other long term (current) drug therapy: Secondary | ICD-10-CM

## 2021-03-10 NOTE — Progress Notes (Signed)
Noted. Will await BMET.

## 2021-03-10 NOTE — Progress Notes (Signed)
Reason for visit: BP check after discontinuing HCTZ and starting on Lasix 40 mg daily  Name of MD requesting visit: Dayna Dunn, PA_C  Blood pressure was 130/68. Patient states that she has been feeling well on the new regimen.

## 2021-03-11 ENCOUNTER — Telehealth: Payer: Self-pay | Admitting: Cardiology

## 2021-03-11 LAB — BASIC METABOLIC PANEL
BUN/Creatinine Ratio: 15 (ref 12–28)
BUN: 20 mg/dL (ref 8–27)
CO2: 23 mmol/L (ref 20–29)
Calcium: 9.3 mg/dL (ref 8.7–10.3)
Chloride: 104 mmol/L (ref 96–106)
Creatinine, Ser: 1.37 mg/dL — ABNORMAL HIGH (ref 0.57–1.00)
Glucose: 238 mg/dL — ABNORMAL HIGH (ref 70–99)
Potassium: 4.8 mmol/L (ref 3.5–5.2)
Sodium: 141 mmol/L (ref 134–144)
eGFR: 43 mL/min/{1.73_m2} — ABNORMAL LOW (ref >59)

## 2021-03-11 NOTE — Telephone Encounter (Signed)
-----   Message from Charlie Pitter, [place] sent at 03/11/2021  7:47 AM EST ----- Please let patient know BMET is stable. Per nurse visit yesterday, pt's BP was stable and she was feeling well on the new regimen.  Continue plan for echo and follow-up as planned. If we can get her a BP cuff via SW if she qualifies so she can follow at home that would be great. In general she should notify if tending to see BPs >469 systolic.

## 2021-03-11 NOTE — Progress Notes (Signed)
Pt has been made aware of normal result and verbalized understanding.  jw  Will send a message to SW about getting pt a bp cuff mailed to her house since she cannot financially afford it at this time. Pt was very appreciative of that.

## 2021-03-11 NOTE — Telephone Encounter (Signed)
Returned call to the pt, she has been made aware of her lab results.

## 2021-03-11 NOTE — Telephone Encounter (Signed)
Pt returning call for results... please advise  

## 2021-03-16 ENCOUNTER — Telehealth: Payer: Self-pay | Admitting: Licensed Clinical Social Worker

## 2021-03-16 NOTE — Telephone Encounter (Signed)
CSW referred to assist patient with obtaining a BP cuff. CSW contacted patient to inform cuff will be delivered to home. Message left. CSW available as needed. Jackie Lafern Brinkley, LCSW, CCSW-MCS [phone]  

## 2021-03-26 ENCOUNTER — Ambulatory Visit (HOSPITAL_COMMUNITY): Payer: Medicare HMO | Attending: Cardiovascular Disease

## 2021-03-26 ENCOUNTER — Other Ambulatory Visit: Payer: Self-pay

## 2021-03-26 DIAGNOSIS — I2510 Atherosclerotic heart disease of native coronary artery without angina pectoris: Secondary | ICD-10-CM

## 2021-03-26 DIAGNOSIS — I10 Essential (primary) hypertension: Secondary | ICD-10-CM

## 2021-03-26 DIAGNOSIS — I272 Pulmonary hypertension, unspecified: Secondary | ICD-10-CM

## 2021-03-26 DIAGNOSIS — R0609 Other forms of dyspnea: Secondary | ICD-10-CM

## 2021-03-26 DIAGNOSIS — I5032 Chronic diastolic (congestive) heart failure: Secondary | ICD-10-CM | POA: Diagnosis not present

## 2021-03-26 DIAGNOSIS — E785 Hyperlipidemia, unspecified: Secondary | ICD-10-CM | POA: Diagnosis not present

## 2021-03-26 DIAGNOSIS — Z87898 Personal history of other specified conditions: Secondary | ICD-10-CM

## 2021-03-26 DIAGNOSIS — I779 Disorder of arteries and arterioles, unspecified: Secondary | ICD-10-CM | POA: Diagnosis not present

## 2021-03-26 DIAGNOSIS — R931 Abnormal findings on diagnostic imaging of heart and coronary circulation: Secondary | ICD-10-CM | POA: Diagnosis not present

## 2021-03-26 LAB — ECHOCARDIOGRAM COMPLETE
Area-P 1/2: 4.31 cm2
S' Lateral: 2.5 cm

## 2021-03-26 MED ORDER — PERFLUTREN LIPID MICROSPHERE
1.0000 mL | INTRAVENOUS | Status: AC | PRN
  Administered 2021-03-26: 3 mL via INTRAVENOUS

## 2021-03-27 ENCOUNTER — Telehealth: Payer: Self-pay

## 2021-03-27 NOTE — Telephone Encounter (Signed)
Attempted phone call to pt and left voicemail message to contact [phone]. 

## 2021-03-27 NOTE — Telephone Encounter (Signed)
-----   Message from Charlie Pitter, [place] sent at 03/27/2021  1:45 PM EST ----- Triage, please let patient know echo shows normal left heart pump function but the right side of her heart is weaker than prior and there is significant worsening of pressure elevation in her lungs. Previously moderate, now severe. Her pulmonary hypertension was felt multifactorial in the past.  Recommendations: - Please find out how breathing is doing on the current furosemide dose and find out if she was able to check her blood pressures at all. If SBP 120 or greater would increase Lasix further to 80mg  daily at this time. - Please arrange sleep study to exclude sleep apnea ( diagnosis of pulmonary HTN and right ventricular dysfunction). - Please move her up her follow-up appointment to the next DOD day Dr. Marlou Porch has, qualifies as a TOC-type appt. May need to consider right heart cath. I will plan to discuss her echo further in clinic on Monday with Dr. Marlou Porch, will preliminarily cc him here for review at that time.

## 2021-03-30 ENCOUNTER — Telehealth: Payer: Self-pay | Admitting: Cardiology

## 2021-03-30 DIAGNOSIS — I5032 Chronic diastolic (congestive) heart failure: Secondary | ICD-10-CM

## 2021-03-30 DIAGNOSIS — I272 Pulmonary hypertension, unspecified: Secondary | ICD-10-CM

## 2021-03-30 NOTE — Telephone Encounter (Signed)
-----   Message from Charlie Pitter, [place] sent at 03/30/2021  1:41 PM EST ----- Please see if you can reach out to pt as below. See plan below. I spoke with Dr. Marlou Porch and he also recommended initiating sooner referral to Advanced HF clinic as well for her severe pulmonary HTN.

## 2021-03-30 NOTE — Telephone Encounter (Signed)
Pt is returning call to Triage

## 2021-04-02 NOTE — Progress Notes (Signed)
Pt has been made aware of normal result and verbalized understanding.  jw

## 2021-04-10 ENCOUNTER — Other Ambulatory Visit: Payer: Self-pay

## 2021-04-10 ENCOUNTER — Encounter: Payer: Self-pay | Admitting: Cardiology

## 2021-04-10 ENCOUNTER — Ambulatory Visit (INDEPENDENT_AMBULATORY_CARE_PROVIDER_SITE_OTHER): Payer: Medicare HMO | Admitting: Cardiology

## 2021-04-10 VITALS — BP 118/60 | HR 82 | Ht 65.0 in | Wt 242.0 lb

## 2021-04-10 DIAGNOSIS — I2510 Atherosclerotic heart disease of native coronary artery without angina pectoris: Secondary | ICD-10-CM | POA: Diagnosis not present

## 2021-04-10 DIAGNOSIS — I272 Pulmonary hypertension, unspecified: Secondary | ICD-10-CM | POA: Insufficient documentation

## 2021-04-10 DIAGNOSIS — Z720 Tobacco use: Secondary | ICD-10-CM | POA: Diagnosis not present

## 2021-04-10 DIAGNOSIS — Z01812 Encounter for preprocedural laboratory examination: Secondary | ICD-10-CM

## 2021-04-10 DIAGNOSIS — I2729 Other secondary pulmonary hypertension: Secondary | ICD-10-CM

## 2021-04-10 NOTE — Assessment & Plan Note (Signed)
Quite elevated pulmonary pressures seen on echo, 111 mmHg.  We will proceed with right left heart catheterization to verify this.  No evidence of mitral valve regurgitation.  Normal EF 70%.

## 2021-04-10 NOTE — Addendum Note (Signed)
Addended by: Candee Furbish C on: 04/10/2021 01:11 PM   Modules accepted: Orders

## 2021-04-10 NOTE — Assessment & Plan Note (Signed)
LIMA to LAD only bypass. Prior cardiac catheterization 2010 showed patent LIMA, 40 to 50% mid RCA, normal circumflex. I would like to set her up for a right and left heart catheterization given her pulmonary hypertension changes on echocardiogram and to evaluate her coronary anatomy as well.  Risks and benefit including stroke heart attack death renal impairment bleeding have been discussed.  Willing to proceed.

## 2021-04-10 NOTE — Progress Notes (Signed)
°Cardiology Office Note:   ° °Date:  04/10/2021  ° °ID:  Deborah Coffey, DOB 04/29/1955, MRN [id number] ° °PCP:  Newlin, Enobong, MD °  °[facility] Providers °Cardiologist:  Donyetta Ogletree, MD    ° °Referring MD: Newlin, Enobong, MD  ° ° °History of Present Illness:   ° °Deborah Coffey is a 65 y.o. female here for follow-up of severe pulmonary hypertension.  Echocardiogram demonstrated estimated pulmonary pressures of 110 mmHg.  This is quite a significant elevation compared to prior.  Her pulmonary hypertension certainly could be multifactorial. No evidence of pulmonary embolism on CT scan 2/22. ° °No smoke in 1 week.  ° °CAD s/p CABGx1 (LIMA-LAD) 2005, chronic diastolic CHF, anemia, depression, HLD, HTN, DM, CKD stage 3b, prior TIAs, pulmonary HTN (felt due to underlying lung disease/smoking), morbid obesity, mild carotid disease, lung nodules by CT 05/2020  ° °She had prior bypass in 2005 with last cath 2010 showing patent LIMA, 40-50% mRCA, normal LCX, normal aortoiliac system. Last nuc 02/2018 was felt to be low risk without ischemia.  ° ° °Past Medical History:  °Diagnosis Date  ° Allergy   ° Anemia   ° CAD in native artery 02/18/2004  ° Mid LAD lesoin just after Large D1 --> CABG x 1 LIMA-LAD  ° CAP (community acquired pneumonia) 08/07/2014  ° Chronic kidney disease, stage 3b (HCC)   ° Depression   ° High cholesterol   ° History of blood transfusion 04/20/2003  ° "related to [facility]"   ° Hypertension   ° Lung nodules   ° Mild carotid artery disease (HCC)   ° Pulmonary hypertension (HCC)   ° S/P CABG x 1 02/18/2004  ° LIMA-LAD; patent by cath in 2010 (LAD lesion actually improved. competitive flow  ° TIA (transient ischemic attack)   ° Type II diabetes mellitus (HCC)   ° ° °Past Surgical History:  °Procedure Laterality Date  ° CARDIAC CATHETERIZATION  2010  ° Previous LAD 95% lesion - now ~30-40%; patent LIMA with competitive flow  ° CESAREAN SECTION  1977; 1989  ° CHOLECYSTECTOMY  ~ 2012  ° CORONARY  ARTERY BYPASS GRAFT  02/2004  ° "CABG X1" (03/22/2013); LIMA-LAD  ° TUBAL LIGATION  1989  ° ° °Current Medications: °Current Meds  °Medication Sig  ° ACCU-CHEK FASTCLIX LANCETS MISC Use as directed to check blood glucose 3 times daily. E11.9  ° acetaminophen (TYLENOL) 500 MG tablet Take 1,000 mg by mouth every 6 (six) hours as needed for headache (pain).   ° Alcohol Swabs (B-D SINGLE USE SWABS REGULAR) PADS USE AS DIRECTED  ° aspirin EC 81 MG tablet Take 81 mg by mouth daily.  ° atorvastatin (LIPITOR) 10 MG tablet Take 1 tablet (10 mg total) by mouth daily.  ° Blood Glucose Monitoring Suppl (ACCU-CHEK NANO SMARTVIEW) w/Device KIT Use as directed to check blood glucose 3 times daily. E11.9  ° clobetasol cream (TEMOVATE) 0.05 % APPLY TOPICALLY 2 (TWO) TIMES DAILY.  ° cyclobenzaprine (FLEXERIL) 10 MG tablet Take 1 tablet (10 mg total) by mouth 2 (two) times daily as needed for muscle spasms.  ° dapagliflozin propanediol (FARXIGA) 5 MG TABS tablet Take 1 tablet (5 mg total) by mouth daily before breakfast.  ° furosemide (LASIX) 20 MG tablet Take 2 tablets (40 mg total) by mouth daily.  ° gabapentin (NEURONTIN) 300 MG capsule Take orally 2 caps (600 mg) in the morning and 3 capsules (900 mg) in the evening  ° glucose blood (ACCU-CHEK SMARTVIEW) test strip Use as   instructed to test blood glucose 3 times daily. E11.9  ° hydrochlorothiazide (MICROZIDE) 12.5 MG capsule Take 12.5 mg by mouth daily.  ° Insulin Pen Needle (DROPLET PEN NEEDLES) 31G X 8 MM MISC USE WITH TOUJEO SOLOSTAR PEN EVERY DAY  ° lisinopril (ZESTRIL) 10 MG tablet Take 1 tablet (10 mg total) by mouth daily.  ° metFORMIN (GLUCOPHAGE-XR) 500 MG 24 hr tablet Take 2 tablets (1,000 mg total) by mouth 2 (two) times daily.  ° metoprolol tartrate (LOPRESSOR) 50 MG tablet Take 1 tablet (50 mg total) by mouth 2 (two) times daily.  ° Multiple Vitamin (MULTIVITAMIN WITH MINERALS) TABS tablet Take 1 tablet by mouth daily.  ° nitroGLYCERIN (NITROSTAT) 0.4 MG SL tablet  Place 1 tablet (0.4 mg total) under the tongue every 5 (five) minutes as needed for chest pain.  ° pantoprazole (PROTONIX) 40 MG tablet Take 1 tablet (40 mg total) by mouth 2 (two) times daily.  °  ° °Allergies:   Ciprofloxacin  ° °Social History  ° °Socioeconomic History  ° Marital status: Divorced  °  Spouse name: Not on file  ° Number of children: Not on file  ° Years of education: Not on file  ° Highest education level: Not on file  °Occupational History  ° Not on file  °Tobacco Use  ° Smoking status: Some Days  °  Packs/day: 0.50  °  Years: 42.00  °  Pack years: 21.00  °  Types: Cigarettes  ° Smokeless tobacco: Never  °Vaping Use  ° Vaping Use: Never used  °Substance and Sexual Activity  ° Alcohol use: Yes  °  Alcohol/week: 1.0 - 2.0 standard drink  °  Types: 1 - 2 Glasses of wine per week  °  Comment: 08/07/2014 "glass of wine maybe 2 times/yr"  ° Drug use: No  °  Types: Marijuana  °  Comment: smoked pot years ago but not currently  ° Sexual activity: Not Currently  °Other Topics Concern  ° Not on file  °Social History Narrative  ° Not on file  ° °Social Determinants of Health  ° °Financial Resource Strain: Not on file  °Food Insecurity: Not on file  °Transportation Needs: Not on file  °Physical Activity: Not on file  °Stress: Not on file  °Social Connections: Not on file  °  ° °Family History: °The patient's family history includes Cancer in her mother; Diabetes in her brother, brother, father, mother, and sister; Heart attack in her brother; Heart disease in her mother and sister; Heart failure in her brother and sister; Hepatitis C in her sister; Hypertension in her daughter; Lupus in her sister. There is no history of Colon cancer. ° °ROS:   °Please see the history of present illness.    °No bleeding no syncope all other systems reviewed and are negative. ° °EKGs/Labs/Other Studies Reviewed:   ° °The following studies were reviewed today: ° °[facility]io 03/26/21: °No adverse arrhythmias °Occasional PVCs, 2.8%., Rare  PACs. °Rare episodes of atrial tachycardia °No evidence of atrial fibrillation. °Continuing with metoprolol. ° °ECHO 03/26/21: °1. Left ventricular ejection fraction, by estimation, is 65 to 70%. The  °left ventricle has normal function. The left ventricle has no regional  °wall motion abnormalities. Left ventricular diastolic parameters were  °normal. There is the interventricular  °septum is flattened in systole and diastole, consistent with right  °ventricular pressure and volume overload.  ° 2. Right ventricular systolic function is moderately reduced. The right  °ventricular size is moderately enlarged. There is   severely elevated  °pulmonary artery systolic pressure. The estimated right ventricular  °systolic pressure is 111.6 mmHg.  ° 3. Right atrial size was moderately dilated.  ° 4. The mitral valve is normal in structure. No evidence of mitral valve  °regurgitation.  ° 5. Tricuspid valve regurgitation is moderate.  ° 6. The aortic valve is grossly normal. Aortic valve regurgitation is not  °visualized. No aortic stenosis is present.  ° 7. The inferior vena cava is dilated in size with >50% respiratory  °variability, suggesting right atrial pressure of 8 mmHg.  ° °Comparison(s): Prior images reviewed side by side. The right ventricular  °systolic function is significantly worse. Pulmonary pressure could not be  °estimated on the previous study.  ° °EKG:  EKG is  ordered today.  The ekg ordered today demonstrates sinus rhythm 88 T wave version V2 ° °Recent Labs: °06/13/2020: B Natriuretic Peptide 365.0 °01/21/2021: ALT 16 °02/27/2021: Hemoglobin 11.7; NT-Pro BNP 1,633; Platelets 330; TSH 1.510 °03/10/2021: BUN 20; Creatinine, Ser 1.37; Potassium 4.8; Sodium 141  °Recent Lipid Panel °   °Component Value Date/Time  ° CHOL 161 01/21/2021 1618  ° TRIG 104 01/21/2021 1618  ° HDL 72 01/21/2021 1618  ° CHOLHDL 2.2 01/21/2021 1618  ° CHOLHDL 2.2 09/02/2016 0533  ° VLDL 13 09/02/2016 0533  ° LDLCALC 70 01/21/2021 1618   ° °    ° °   ° °Physical Exam:   ° °VS:  BP 118/60    Pulse 82    Ht 5' 5" (1.651 m)    Wt 242 lb (109.8 kg)    LMP  (LMP Unknown)    SpO2 92%    BMI 40.27 kg/m²    ° °Wt Readings from Last 3 Encounters:  °04/10/21 242 lb (109.8 kg)  °02/27/21 239 lb 12.8 oz (108.8 kg)  °01/21/21 243 lb 12.8 oz (110.6 kg)  °  ° °GEN:  Well nourished, well developed in no acute distress °HEENT: Normal °NECK: No JVD; No carotid bruits °LYMPHATICS: No lymphadenopathy °CARDIAC: RRR, no murmurs, no rubs, gallops °RESPIRATORY:  Clear to auscultation without rales, mild wheeze heard on exam °ABDOMEN: Soft, non-tender, non-distended °MUSCULOSKELETAL:  No edema; No deformity  °SKIN: Warm and dry °NEUROLOGIC:  Alert and oriented x 3 °PSYCHIATRIC:  Normal affect  ° °ASSESSMENT:   ° °1. Pre-procedure lab exam   °2. Coronary artery disease involving native coronary artery of native heart without angina pectoris   °3. Tobacco use   °4. Other secondary pulmonary hypertension (HCC)   ° °PLAN:   ° °In order of problems listed above: ° °CAD (coronary artery disease) - CABG x 1 (LIMA-LAD) °LIMA to LAD only bypass. °Prior cardiac catheterization 2010 showed patent LIMA, 40 to 50% mid RCA, normal circumflex. °I would like to set her up for a right and left heart catheterization given her pulmonary hypertension changes on echocardiogram and to evaluate her coronary anatomy as well.  Risks and benefit including stroke heart attack death renal impairment bleeding have been discussed.  Willing to proceed. ° °Tobacco use °Encouraged that she is quit smoking for 1 week. ° °Other secondary pulmonary hypertension (HCC) °Quite elevated pulmonary pressures seen on echo, 111 mmHg.  We will proceed with right left heart catheterization to verify this.  No evidence of mitral valve regurgitation.  Normal EF 70%. °  ° ° °Shared Decision Making/Informed Consent °The risks [stroke (1 in 1000), death (1 in 1000), kidney failure [usually temporary] (1 in 500), bleeding  (1 in 200),   allergic reaction [possibly serious] (1 in 200)], benefits (diagnostic support and management of coronary artery disease) and alternatives of a cardiac catheterization were discussed in detail with Deborah Coffey and she is willing to proceed.  ° ° °Medication Adjustments/Labs and Tests Ordered: °Current medicines are reviewed at length with the patient today.  Concerns regarding medicines are outlined above.  °Orders Placed This Encounter  °Procedures  ° CBC  ° Basic metabolic panel  ° EKG 12-Lead  ° °No orders of the defined types were placed in this encounter. ° ° °Patient Instructions  °Medication Instructions:  °The current medical regimen is effective;  continue present plan and medications. ° °*If you need a refill on your cardiac medications before your next appointment, please call your pharmacy* ° °Lab Work: °Please have blood work today (CBC, BMP) ° °If you have labs (blood work) drawn today and your tests are completely normal, you will receive your results only by: °MyChart Message (if you have MyChart) OR °A paper copy in the mail °If you have any lab test that is abnormal or we need to change your treatment, we will call you to review the results. ° ° °Testing/Procedures: ° ° °[facility] °[facility] [address] [facility] °[address], [address] °[place] [address] °Dept: [phone] °Loc: [phone] ° °Kahlen Locey  04/10/2021 ° °You are scheduled for a Cardiac Catheterization on Thursday, December 29 with Dr. Jayadeep Varanasi. ° °1. Please arrive at the North Tower (Main Entrance A) at [facility]: [address] [place], [place] [address] at 10:00 AM (two hours before your procedure to ensure your preparation). Free valet parking service is available.  ° °Special note: Every effort is made to have your procedure done on time. Please understand that emergencies sometimes delay scheduled procedures. ° °2. Diet: Do  not eat or drink anything after midnight prior to your procedure except sips of water to take medications. ° °3. Labs:  today ° °4. Medication instructions in preparation for your procedure: ° °Do not take your Furosemide, Lisinopril, Metformin or Hydrochlorothiazide the morning of your procedure. ° °On the morning of your procedure, take your Aspirin and any morning medicines NOT listed above.  You may use sips of water. ° °5. Plan for one night stay--bring personal belongings. °6. Bring a current list of your medications and current insurance cards. °7. You MUST have a responsible person to drive you home. °8. Someone MUST be with you the first 24 hours after you arrive home or your discharge will be delayed. °9. Please wear clothes that are easy to get on and off and wear slip-on shoes. ° °Thank you for allowing us to care for you! °  -- [facility] ° °Follow-Up: °At [facility], you and your health needs are our priority.  As part of our continuing mission to provide you with exceptional heart care, we have created designated Provider Care Teams.  These Care Teams include your primary Cardiologist (physician) and Advanced Practice Providers (APPs -  Physician Assistants and Nurse Practitioners) who all work together to provide you with the care you need, when you need it. ° °We recommend signing up for the patient portal called "MyChart".  Sign up information is provided on this After Visit Summary.  MyChart is used to connect with patients for Virtual Visits (Telemedicine).  Patients are able to view lab/test results, encounter notes, upcoming appointments, etc.  Non-urgent messages can be sent to   your provider as well.   °To learn more about what you can do with MyChart, go to [email or web address].   ° °Your next appointment:   °6 month(s) ° °The format for your next appointment:   °In Person ° °Provider:   °Valli Randol, MD   ° ° °Thank you for choosing [facility]!! ° ° °  ° °Signed, °Kynzleigh Bandel, MD  °04/10/2021 12:38 PM    °[facility] °

## 2021-04-10 NOTE — H&P (View-Only) (Signed)
Cardiology Office Note:    Date:  04/10/2021   ID:  Deborah Coffey, DOB 03-11-1956, MRN [id number]  PCP:  Charlott Rakes, MD   [facility] Providers Cardiologist:  Candee Furbish, MD     Referring MD: Charlott Rakes, MD    History of Present Illness:    Deborah Coffey is a 65 y.o. female here for follow-up of severe pulmonary hypertension.  Echocardiogram demonstrated estimated pulmonary pressures of 110 mmHg.  This is quite a significant elevation compared to prior.  Her pulmonary hypertension certainly could be multifactorial. No evidence of pulmonary embolism on CT scan 2/22.  No smoke in 1 week.   CAD s/p CABGx1 (LIMA-LAD) 4098, chronic diastolic CHF, anemia, depression, HLD, HTN, DM, CKD stage 3b, prior TIAs, pulmonary HTN (felt due to underlying lung disease/smoking), morbid obesity, mild carotid disease, lung nodules by CT 05/2020   She had prior bypass in 2005 with last cath 2010 showing patent LIMA, 40-50% mRCA, normal LCX, normal aortoiliac system. Last nuc 02/2018 was felt to be low risk without ischemia.    Past Medical History:  Diagnosis Date   Allergy    Anemia    CAD in native artery 02/18/2004   Mid LAD lesoin just after Large D1 --> CABG x 1 LIMA-LAD   CAP (community acquired pneumonia) 08/07/2014   Chronic kidney disease, stage 3b ([place])    Depression    High cholesterol    History of blood transfusion 04/20/2003   "related to OHS"    Hypertension    Lung nodules    Mild carotid artery disease ([place])    Pulmonary hypertension ([place])    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 competitive flow   TIA (transient ischemic attack)    Type II diabetes mellitus ([place])     Past Surgical History:  Procedure Laterality Date   CARDIAC CATHETERIZATION  2010   Previous LAD 95% lesion - now ~30-40%; patent LIMA with competitive flow   CESAREAN SECTION  1977; 1989   CHOLECYSTECTOMY  ~ 2012   CORONARY  ARTERY BYPASS GRAFT  02/2004   "CABG X1" (03/22/2013); LIMA-LAD   TUBAL LIGATION  1989    Current Medications: Current Meds  Medication Sig   ACCU-CHEK FASTCLIX LANCETS MISC Use as directed to check blood glucose 3 times daily. E11.9   acetaminophen (TYLENOL) 500 MG tablet Take 1,000 mg by mouth every 6 (six) hours as needed for headache (pain).    Alcohol Swabs (B-D SINGLE USE SWABS REGULAR) PADS USE AS DIRECTED   aspirin EC 81 MG tablet Take 81 mg by mouth daily.   atorvastatin (LIPITOR) 10 MG tablet Take 1 tablet (10 mg total) by mouth daily.   Blood Glucose Monitoring Suppl (ACCU-CHEK NANO SMARTVIEW) w/Device KIT Use as directed to check blood glucose 3 times daily. E11.9   clobetasol cream (TEMOVATE) 0.05 % APPLY TOPICALLY 2 (TWO) TIMES DAILY.   cyclobenzaprine (FLEXERIL) 10 MG tablet Take 1 tablet (10 mg total) by mouth 2 (two) times daily as needed for muscle spasms.   dapagliflozin propanediol (FARXIGA) 5 MG TABS tablet Take 1 tablet (5 mg total) by mouth daily before breakfast.   furosemide (LASIX) 20 MG tablet Take 2 tablets (40 mg total) by mouth daily.   gabapentin (NEURONTIN) 300 MG capsule Take orally 2 caps (600 mg) in the morning and 3 capsules (900 mg) in the evening   glucose blood (ACCU-CHEK SMARTVIEW) test strip Use as  instructed to test blood glucose 3 times daily. E11.9   hydrochlorothiazide (MICROZIDE) 12.5 MG capsule Take 12.5 mg by mouth daily.   Insulin Pen Needle (DROPLET PEN NEEDLES) 31G X 8 MM MISC USE WITH TOUJEO SOLOSTAR PEN EVERY DAY   lisinopril (ZESTRIL) 10 MG tablet Take 1 tablet (10 mg total) by mouth daily.   metFORMIN (GLUCOPHAGE-XR) 500 MG 24 hr tablet Take 2 tablets (1,000 mg total) by mouth 2 (two) times daily.   metoprolol tartrate (LOPRESSOR) 50 MG tablet Take 1 tablet (50 mg total) by mouth 2 (two) times daily.   Multiple Vitamin (MULTIVITAMIN WITH MINERALS) TABS tablet Take 1 tablet by mouth daily.   nitroGLYCERIN (NITROSTAT) 0.4 MG SL tablet  Place 1 tablet (0.4 mg total) under the tongue every 5 (five) minutes as needed for chest pain.   pantoprazole (PROTONIX) 40 MG tablet Take 1 tablet (40 mg total) by mouth 2 (two) times daily.     Allergies:   Ciprofloxacin   Social History   Socioeconomic History   Marital status: Divorced    Spouse name: Not on file   Number of children: Not on file   Years of education: Not on file   Highest education level: Not on file  Occupational History   Not on file  Tobacco Use   Smoking status: Some Days    Packs/day: 0.50    Years: 42.00    Pack years: 21.00    Types: Cigarettes   Smokeless tobacco: Never  Vaping Use   Vaping Use: Never used  Substance and Sexual Activity   Alcohol use: Yes    Alcohol/week: 1.0 - 2.0 standard drink    Types: 1 - 2 Glasses of wine per week    Comment: 08/07/2014 "glass of wine maybe 2 times/yr"   Drug use: No    Types: Marijuana    Comment: smoked pot years ago but not currently   Sexual activity: Not Currently  Other Topics Concern   Not on file  Social History Narrative   Not on file   Social Determinants of Health   Financial Resource Strain: Not on file  Food Insecurity: Not on file  Transportation Needs: Not on file  Physical Activity: Not on file  Stress: Not on file  Social Connections: Not on file     Family History: The patient's family history includes Cancer in her mother; Diabetes in her brother, brother, father, mother, and sister; Heart attack in her brother; Heart disease in her mother and sister; Heart failure in her brother and sister; Hepatitis C in her sister; Hypertension in her daughter; Lupus in her sister. There is no history of Colon cancer.  ROS:   Please see the history of present illness.    No bleeding no syncope all other systems reviewed and are negative.  EKGs/Labs/Other Studies Reviewed:    The following studies were reviewed today:  [facility] 03/26/21: No adverse arrhythmias Occasional PVCs, 2.8%., Rare  PACs. Rare episodes of atrial tachycardia No evidence of atrial fibrillation. Continuing with metoprolol.  ECHO 03/26/21: 1. Left ventricular ejection fraction, by estimation, is 65 to 70%. The  left ventricle has normal function. The left ventricle has no regional  wall motion abnormalities. Left ventricular diastolic parameters were  normal. There is the interventricular  septum is flattened in systole and diastole, consistent with right  ventricular pressure and volume overload.   2. Right ventricular systolic function is moderately reduced. The right  ventricular size is moderately enlarged. There is  severely elevated  pulmonary artery systolic pressure. The estimated right ventricular  systolic pressure is 932.6 mmHg.   3. Right atrial size was moderately dilated.   4. The mitral valve is normal in structure. No evidence of mitral valve  regurgitation.   5. Tricuspid valve regurgitation is moderate.   6. The aortic valve is grossly normal. Aortic valve regurgitation is not  visualized. No aortic stenosis is present.   7. The inferior vena cava is dilated in size with >50% respiratory  variability, suggesting right atrial pressure of 8 mmHg.   Comparison(s): Prior images reviewed side by side. The right ventricular  systolic function is significantly worse. Pulmonary pressure could not be  estimated on the previous study.   EKG:  EKG is  ordered today.  The ekg ordered today demonstrates sinus rhythm 88 T wave version V2  Recent Labs: 06/13/2020: B Natriuretic Peptide 365.0 01/21/2021: ALT 16 02/27/2021: Hemoglobin 11.7; NT-Pro BNP 1,633; Platelets 330; TSH 1.510 03/10/2021: BUN 20; Creatinine, Ser 1.37; Potassium 4.8; Sodium 141  Recent Lipid Panel    Component Value Date/Time   CHOL 161 01/21/2021 1618   TRIG 104 01/21/2021 1618   HDL 72 01/21/2021 1618   CHOLHDL 2.2 01/21/2021 1618   CHOLHDL 2.2 09/02/2016 0533   VLDL 13 09/02/2016 0533   LDLCALC 70 01/21/2021 1618             Physical Exam:    VS:  BP 118/60    Pulse 82    Ht _0  (1.651 m)    Wt 242 lb (109.8 kg)    LMP  (LMP Unknown)    SpO2 92%    BMI 40.27 kg/m     Wt Readings from Last 3 Encounters:  04/10/21 242 lb (109.8 kg)  02/27/21 239 lb 12.8 oz (108.8 kg)  01/21/21 243 lb 12.8 oz (110.6 kg)     GEN:  Well nourished, well developed in no acute distress HEENT: Normal NECK: No JVD; No carotid bruits LYMPHATICS: No lymphadenopathy CARDIAC: RRR, no murmurs, no rubs, gallops RESPIRATORY:  Clear to auscultation without rales, mild wheeze heard on exam ABDOMEN: Soft, non-tender, non-distended MUSCULOSKELETAL:  No edema; No deformity  SKIN: Warm and dry NEUROLOGIC:  Alert and oriented x 3 PSYCHIATRIC:  Normal affect   ASSESSMENT:    1. Pre-procedure lab exam   2. Coronary artery disease involving native coronary artery of native heart without angina pectoris   3. Tobacco use   4. Other secondary pulmonary hypertension (HCC)    PLAN:    In order of problems listed above:  CAD (coronary artery disease) - CABG x 1 (LIMA-LAD) LIMA to LAD only bypass. Prior cardiac catheterization 2010 showed patent LIMA, 40 to 50% mid RCA, normal circumflex. I would like to set her up for a right and left heart catheterization given her pulmonary hypertension changes on echocardiogram and to evaluate her coronary anatomy as well.  Risks and benefit including stroke heart attack death renal impairment bleeding have been discussed.  Willing to proceed.  Tobacco use Encouraged that she is quit smoking for 1 week.  Other secondary pulmonary hypertension (HCC) Quite elevated pulmonary pressures seen on echo, 111 mmHg.  We will proceed with right left heart catheterization to verify this.  No evidence of mitral valve regurgitation.  Normal EF 70%.     Shared Decision Making/Informed Consent The risks [stroke (1 in 1000), death (1 in 1000), kidney failure [usually temporary] (1 in 500), bleeding  (1 in 200),  allergic reaction [possibly serious] (1 in 200)], benefits (diagnostic support and management of coronary artery disease) and alternatives of a cardiac catheterization were discussed in detail with Deborah Coffey and she is willing to proceed.    Medication Adjustments/Labs and Tests Ordered: Current medicines are reviewed at length with the patient today.  Concerns regarding medicines are outlined above.  Orders Placed This Encounter  Procedures   CBC   Basic metabolic panel   EKG 97-WYOV   No orders of the defined types were placed in this encounter.   Patient Instructions  Medication Instructions:  The current medical regimen is effective;  continue present plan and medications.  *If you need a refill on your cardiac medications before your next appointment, please call your pharmacy*  Lab Work: Please have blood work today (CBC, BMP)  If you have labs (blood work) drawn today and your tests are completely normal, you will receive your results only by: MyChart Message (if you have [place]) OR A paper copy in the mail If you have any lab test that is abnormal or we need to change your treatment, we will call you to review the results.   Testing/Procedures:   [facility] OFFICE [place], [place] [address] Dept: [phone] Loc: [phone]  Deborah Coffey  04/10/2021  You are scheduled for a Cardiac Catheterization on Thursday, December 29 with Dr. Larae Grooms.  1. Please arrive at the [facility] (Main Entrance A) at [facility]: [place], [place] [address] at 10:00 AM (two hours before your procedure to ensure your preparation). Free valet parking service is available.   Special note: Every effort is made to have your procedure done on time. Please understand that emergencies sometimes delay scheduled procedures.  2. Diet: Do  not eat or drink anything after midnight prior to your procedure except sips of water to take medications.  3. Labs:  today  4. Medication instructions in preparation for your procedure:  Do not take your Furosemide, Lisinopril, Metformin or Hydrochlorothiazide the morning of your procedure.  On the morning of your procedure, take your Aspirin and any morning medicines NOT listed above.  You may use sips of water.  5. Plan for one night stay--bring personal belongings. 6. Bring a current list of your medications and current insurance cards. 7. You MUST have a responsible person to drive you home. 8. Someone MUST be with you the first 24 hours after you arrive home or your discharge will be delayed. 9. Please wear clothes that are easy to get on and off and wear slip-on shoes.  Thank you for allowing [place] to care for you!   -- [place] [facility] services  Follow-Up: At [facility], you and your health needs are our priority.  As part of our continuing mission to provide you with exceptional heart care, we have created designated Provider Care Teams.  These Care Teams include your primary Cardiologist (physician) and Advanced Practice Providers (APPs -  Physician Assistants and Nurse Practitioners) who all work together to provide you with the care you need, when you need it.  We recommend signing up for the patient portal called "MyChart".  Sign up information is provided on this After Visit Summary.  MyChart is used to connect with patients for Virtual Visits (Telemedicine).  Patients are able to view lab/test results, encounter notes, upcoming appointments, etc.  Non-urgent messages can be sent to  your provider as well.   To learn more about what you can do with MyChart, go to [email or web address].    Your next appointment:   6 month(s)  The format for your next appointment:   In Person  Provider:   Candee Furbish, MD     Thank you for choosing [facility]!!      Signed, Candee Furbish, MD  04/10/2021 12:38 PM    [place]

## 2021-04-10 NOTE — Patient Instructions (Signed)
Medication Instructions:  The current medical regimen is effective;  continue present plan and medications.  *If you need a refill on your cardiac medications before your next appointment, please call your pharmacy*  Lab Work: Please have blood work today (CBC, BMP)  If you have labs (blood work) drawn today and your tests are completely normal, you will receive your results only by: MyChart Message (if you have [place]) OR A paper copy in the mail If you have any lab test that is abnormal or we need to change your treatment, we will call you to review the results.   Testing/Procedures:   [facility] OFFICE [place], [place] [address] Dept: [phone] Loc: [phone]  Deborah Coffey  04/10/2021  You are scheduled for a Cardiac Catheterization on Thursday, December 29 with Dr. Larae Grooms.  1. Please arrive at the [facility] (Main Entrance A) at [facility]: [place], [place] [address] at 10:00 AM (two hours before your procedure to ensure your preparation). Free valet parking service is available.   Special note: Every effort is made to have your procedure done on time. Please understand that emergencies sometimes delay scheduled procedures.  2. Diet: Do not eat or drink anything after midnight prior to your procedure except sips of water to take medications.  3. Labs:  today  4. Medication instructions in preparation for your procedure:  Do not take your Furosemide, Lisinopril, Metformin or Hydrochlorothiazide the morning of your procedure.  On the morning of your procedure, take your Aspirin and any morning medicines NOT listed above.  You may use sips of water.  5. Plan for one night stay--bring personal belongings. 6. Bring a current list of your medications and current insurance cards. 7. You MUST have a responsible person to drive you  home. 8. Someone MUST be with you the first 24 hours after you arrive home or your discharge will be delayed. 9. Please wear clothes that are easy to get on and off and wear slip-on shoes.  Thank you for allowing [place] to care for you!   -- [place] [facility] services  Follow-Up: At [facility], you and your health needs are our priority.  As part of our continuing mission to provide you with exceptional heart care, we have created designated Provider Care Teams.  These Care Teams include your primary Cardiologist (physician) and Advanced Practice Providers (APPs -  Physician Assistants and Nurse Practitioners) who all work together to provide you with the care you need, when you need it.  We recommend signing up for the patient portal called "MyChart".  Sign up information is provided on this After Visit Summary.  MyChart is used to connect with patients for Virtual Visits (Telemedicine).  Patients are able to view lab/test results, encounter notes, upcoming appointments, etc.  Non-urgent messages can be sent to your provider as well.   To learn more about what you can do with MyChart, go to [email or web address].    Your next appointment:   6 month(s)  The format for your next appointment:   In Person  Provider:   Candee Furbish, MD     Thank you for choosing [facility]!!

## 2021-04-10 NOTE — Assessment & Plan Note (Signed)
Encouraged that she is quit smoking for 1 week.

## 2021-04-11 LAB — CBC
Hematocrit: 37 % (ref 34.0–46.6)
Hemoglobin: 12.1 g/dL (ref 11.1–15.9)
MCH: 27.3 pg (ref 26.6–33.0)
MCHC: 32.7 g/dL (ref 31.5–35.7)
MCV: 83 fL (ref 79–97)
Platelets: 261 10*3/uL (ref 150–450)
RBC: 4.44 x10E6/uL (ref 3.77–5.28)
RDW: 13.7 % (ref 11.7–15.4)
WBC: 6.2 10*3/uL (ref 3.4–10.8)

## 2021-04-11 LAB — BASIC METABOLIC PANEL
BUN/Creatinine Ratio: 17 (ref 12–28)
BUN: 32 mg/dL — ABNORMAL HIGH (ref 8–27)
CO2: 22 mmol/L (ref 20–29)
Calcium: 9.1 mg/dL (ref 8.7–10.3)
Chloride: 102 mmol/L (ref 96–106)
Creatinine, Ser: 1.86 mg/dL — ABNORMAL HIGH (ref 0.57–1.00)
Glucose: 124 mg/dL — ABNORMAL HIGH (ref 70–99)
Potassium: 4.4 mmol/L (ref 3.5–5.2)
Sodium: 139 mmol/L (ref 134–144)
eGFR: 30 mL/min/{1.73_m2} — ABNORMAL LOW (ref >59)

## 2021-04-15 ENCOUNTER — Telehealth: Payer: Self-pay

## 2021-04-15 NOTE — Telephone Encounter (Signed)
Called patient and reviewed the following pre-cath instructions with her. Patient has no questions at this time.  Cardiac catheterization scheduled at [facility] for: [facility]) at: 0600  Diet-no solid food after midnight prior to cath, clear liquids until 5 AM day of procedure.  Medication instructions for procedure:  -Usual morning medications can be taken pre-cath with sips of water including aspirin 81 mg.  -Patient instructed to hold the following medications the morning of the procedure: lasix, farxiga, metformin and insulin   Confirmed patient has responsible adult to drive home post procedure and be with patient first 24 hours after arriving home.  [facility] does allow one visitor to accompany you and wait in the hospital waiting room while you are there for your procedure.  You and your visitor will be asked to wear a mask once you enter the hospital.  Patient reports does not currently have any new symptoms concerning for COVID-19 and no household members with COVID-19 like illness.

## 2021-04-16 ENCOUNTER — Encounter (HOSPITAL_COMMUNITY)
Admission: RE | Disposition: A | Discharge status: Home / Self Care | Payer: Self-pay | Source: Home / Self Care | Attending: Interventional Cardiology

## 2021-04-16 ENCOUNTER — Other Ambulatory Visit: Payer: Self-pay

## 2021-04-16 ENCOUNTER — Ambulatory Visit (HOSPITAL_COMMUNITY)
Admission: RE | Admit: 2021-04-16 | Discharge: 2021-04-16 | Disposition: A | Discharge status: Home / Self Care | Payer: Medicare HMO | Attending: Interventional Cardiology | Admitting: Interventional Cardiology

## 2021-04-16 DIAGNOSIS — D649 Anemia, unspecified: Secondary | ICD-10-CM | POA: Diagnosis not present

## 2021-04-16 DIAGNOSIS — F32A Depression, unspecified: Secondary | ICD-10-CM | POA: Insufficient documentation

## 2021-04-16 DIAGNOSIS — I5032 Chronic diastolic (congestive) heart failure: Secondary | ICD-10-CM | POA: Diagnosis not present

## 2021-04-16 DIAGNOSIS — Z951 Presence of aortocoronary bypass graft: Secondary | ICD-10-CM | POA: Insufficient documentation

## 2021-04-16 DIAGNOSIS — E785 Hyperlipidemia, unspecified: Secondary | ICD-10-CM | POA: Diagnosis not present

## 2021-04-16 DIAGNOSIS — E1122 Type 2 diabetes mellitus with diabetic chronic kidney disease: Secondary | ICD-10-CM | POA: Insufficient documentation

## 2021-04-16 DIAGNOSIS — I2729 Other secondary pulmonary hypertension: Secondary | ICD-10-CM | POA: Insufficient documentation

## 2021-04-16 DIAGNOSIS — Z8673 Personal history of transient ischemic attack (TIA), and cerebral infarction without residual deficits: Secondary | ICD-10-CM | POA: Insufficient documentation

## 2021-04-16 DIAGNOSIS — E1169 Type 2 diabetes mellitus with other specified complication: Secondary | ICD-10-CM

## 2021-04-16 DIAGNOSIS — Z6841 Body Mass Index (BMI) 40.0 and over, adult: Secondary | ICD-10-CM | POA: Diagnosis not present

## 2021-04-16 DIAGNOSIS — I2510 Atherosclerotic heart disease of native coronary artery without angina pectoris: Secondary | ICD-10-CM

## 2021-04-16 DIAGNOSIS — Z01812 Encounter for preprocedural laboratory examination: Secondary | ICD-10-CM

## 2021-04-16 DIAGNOSIS — N1832 Chronic kidney disease, stage 3b: Secondary | ICD-10-CM | POA: Insufficient documentation

## 2021-04-16 DIAGNOSIS — I25118 Atherosclerotic heart disease of native coronary artery with other forms of angina pectoris: Secondary | ICD-10-CM | POA: Insufficient documentation

## 2021-04-16 DIAGNOSIS — Z720 Tobacco use: Secondary | ICD-10-CM

## 2021-04-16 HISTORY — PX: RIGHT/LEFT HEART CATH AND CORONARY/GRAFT ANGIOGRAPHY: CATH118267

## 2021-04-16 LAB — POCT I-STAT EG7
Acid-Base Excess: 0 mmol/L (ref 0.0–2.0)
Acid-base deficit: 2 mmol/L (ref 0.0–2.0)
Bicarbonate: 23.6 mmol/L (ref 20.0–28.0)
Bicarbonate: 25.4 mmol/L (ref 20.0–28.0)
Calcium, Ion: 1.26 mmol/L (ref 1.15–1.40)
Calcium, Ion: 1.27 mmol/L (ref 1.15–1.40)
HCT: 33 % — ABNORMAL LOW (ref 36.0–46.0)
HCT: 34 % — ABNORMAL LOW (ref 36.0–46.0)
Hemoglobin: 11.2 g/dL — ABNORMAL LOW (ref 12.0–15.0)
Hemoglobin: 11.6 g/dL — ABNORMAL LOW (ref 12.0–15.0)
O2 Saturation: 70 %
O2 Saturation: 72 %
Potassium: 3.4 mmol/L — ABNORMAL LOW (ref 3.5–5.1)
Potassium: 3.5 mmol/L (ref 3.5–5.1)
Sodium: 145 mmol/L (ref 135–145)
Sodium: 146 mmol/L — ABNORMAL HIGH (ref 135–145)
TCO2: 25 mmol/L (ref 22–32)
TCO2: 27 mmol/L (ref 22–32)
pCO2, Ven: 44.2 mmHg (ref 44.0–60.0)
pCO2, Ven: 44.4 mmHg (ref 44.0–60.0)
pH, Ven: 7.334 (ref 7.250–7.430)
pH, Ven: 7.367 (ref 7.250–7.430)
pO2, Ven: 39 mmHg (ref 32.0–45.0)
pO2, Ven: 39 mmHg (ref 32.0–45.0)

## 2021-04-16 LAB — POCT I-STAT 7, (LYTES, BLD GAS, ICA,H+H)
Acid-base deficit: 3 mmol/L — ABNORMAL HIGH (ref 0.0–2.0)
Bicarbonate: 22.2 mmol/L (ref 20.0–28.0)
Calcium, Ion: 1.26 mmol/L (ref 1.15–1.40)
HCT: 33 % — ABNORMAL LOW (ref 36.0–46.0)
Hemoglobin: 11.2 g/dL — ABNORMAL LOW (ref 12.0–15.0)
O2 Saturation: 97 %
Potassium: 3.4 mmol/L — ABNORMAL LOW (ref 3.5–5.1)
Sodium: 145 mmol/L (ref 135–145)
TCO2: 23 mmol/L (ref 22–32)
pCO2 arterial: 39.7 mmHg (ref 32.0–48.0)
pH, Arterial: 7.355 (ref 7.350–7.450)
pO2, Arterial: 94 mmHg (ref 83.0–108.0)

## 2021-04-16 LAB — BASIC METABOLIC PANEL
Anion gap: 6 (ref 5–15)
BUN: 12 mg/dL (ref 8–23)
CO2: 25 mmol/L (ref 22–32)
Calcium: 8.9 mg/dL (ref 8.9–10.3)
Chloride: 113 mmol/L — ABNORMAL HIGH (ref 98–111)
Creatinine, Ser: 0.99 mg/dL (ref 0.44–1.00)
GFR, Estimated: >60 mL/min (ref >60)
Glucose, Bld: 82 mg/dL (ref 70–99)
Potassium: 3.9 mmol/L (ref 3.5–5.1)
Sodium: 144 mmol/L (ref 135–145)

## 2021-04-16 SURGERY — RIGHT/LEFT HEART CATH AND CORONARY/GRAFT ANGIOGRAPHY
Anesthesia: LOCAL

## 2021-04-16 MED ORDER — LIDOCAINE HCL (PF) 1 % IJ SOLN
INTRAMUSCULAR | Status: AC
  Filled 2021-04-16: qty 30

## 2021-04-16 MED ORDER — SODIUM CHLORIDE 0.9 % IV SOLN: 250.0000 mL | INTRAVENOUS | Status: DC | PRN

## 2021-04-16 MED ORDER — METFORMIN HCL ER 500 MG PO TB24: 1000.0000 mg | ORAL_TABLET | Freq: Two times a day (BID) | ORAL | 1 refills | Status: DC

## 2021-04-16 MED ORDER — HEPARIN (PORCINE) IN NACL 1000-0.9 UT/500ML-% IV SOLN
INTRAVENOUS | Status: DC | PRN
  Administered 2021-04-16 (×2): 500 mL

## 2021-04-16 MED ORDER — FENTANYL CITRATE (PF) 100 MCG/2ML IJ SOLN
INTRAMUSCULAR | Status: DC | PRN
  Administered 2021-04-16 (×2): 25 ug via INTRAVENOUS

## 2021-04-16 MED ORDER — HEPARIN SODIUM (PORCINE) 1000 UNIT/ML IJ SOLN
INTRAMUSCULAR | Status: AC
  Filled 2021-04-16: qty 10

## 2021-04-16 MED ORDER — FENTANYL CITRATE (PF) 100 MCG/2ML IJ SOLN
INTRAMUSCULAR | Status: AC
  Filled 2021-04-16: qty 2

## 2021-04-16 MED ORDER — ONDANSETRON HCL 4 MG/2ML IJ SOLN: 4.0000 mg | Freq: Four times a day (QID) | INTRAMUSCULAR | Status: DC | PRN

## 2021-04-16 MED ORDER — SODIUM CHLORIDE 0.9% FLUSH: 3.0000 mL | INTRAVENOUS | Status: DC | PRN

## 2021-04-16 MED ORDER — ACETAMINOPHEN 325 MG PO TABS: 650.0000 mg | ORAL_TABLET | ORAL | Status: DC | PRN

## 2021-04-16 MED ORDER — SODIUM CHLORIDE 0.9% FLUSH: 3.0000 mL | Freq: Two times a day (BID) | INTRAVENOUS | Status: DC

## 2021-04-16 MED ORDER — HYDRALAZINE HCL 20 MG/ML IJ SOLN: 10.0000 mg | INTRAMUSCULAR | Status: DC | PRN

## 2021-04-16 MED ORDER — VERAPAMIL HCL 2.5 MG/ML IV SOLN
INTRAVENOUS | Status: DC | PRN
  Administered 2021-04-16: 14:00:00 10 mL via INTRA_ARTERIAL

## 2021-04-16 MED ORDER — HEPARIN SODIUM (PORCINE) 1000 UNIT/ML IJ SOLN
INTRAMUSCULAR | Status: DC | PRN
  Administered 2021-04-16: 5000 [IU] via INTRAVENOUS

## 2021-04-16 MED ORDER — ASPIRIN 81 MG PO CHEW: 81.0000 mg | CHEWABLE_TABLET | ORAL | Status: DC

## 2021-04-16 MED ORDER — LABETALOL HCL 5 MG/ML IV SOLN: 10.0000 mg | INTRAVENOUS | Status: DC | PRN

## 2021-04-16 MED ORDER — HEPARIN (PORCINE) IN NACL 1000-0.9 UT/500ML-% IV SOLN
INTRAVENOUS | Status: AC
  Filled 2021-04-16: qty 1000

## 2021-04-16 MED ORDER — VERAPAMIL HCL 2.5 MG/ML IV SOLN
INTRAVENOUS | Status: AC
  Filled 2021-04-16: qty 2

## 2021-04-16 MED ORDER — MIDAZOLAM HCL 2 MG/2ML IJ SOLN
INTRAMUSCULAR | Status: AC
  Filled 2021-04-16: qty 2

## 2021-04-16 MED ORDER — SODIUM CHLORIDE 0.9 % IV SOLN: INTRAVENOUS | Status: AC

## 2021-04-16 MED ORDER — MIDAZOLAM HCL 2 MG/2ML IJ SOLN
INTRAMUSCULAR | Status: DC | PRN
  Administered 2021-04-16: 1 mg via INTRAVENOUS
  Administered 2021-04-16: 2 mg via INTRAVENOUS

## 2021-04-16 MED ORDER — SODIUM CHLORIDE 0.9 % IV SOLN: INTRAVENOUS | Status: DC | PRN

## 2021-04-16 MED ORDER — SODIUM CHLORIDE 0.9 % WEIGHT BASED INFUSION: 1.0000 mL/kg/h | INTRAVENOUS | Status: DC

## 2021-04-16 MED ORDER — LIDOCAINE HCL (PF) 1 % IJ SOLN
INTRAMUSCULAR | Status: DC | PRN
  Administered 2021-04-16 (×2): 5 mL

## 2021-04-16 MED ORDER — SODIUM CHLORIDE 0.9 % WEIGHT BASED INFUSION
3.0000 mL/kg/h | INTRAVENOUS | Status: AC
  Administered 2021-04-16: 07:00:00 3 mL/kg/h via INTRAVENOUS

## 2021-04-16 MED ORDER — IOHEXOL 350 MG/ML SOLN
INTRAVENOUS | Status: DC | PRN
  Administered 2021-04-16: 15:00:00 85 mL

## 2021-04-16 SURGICAL SUPPLY — 13 items
CATH BALLN WEDGE 5F 110CM (CATHETERS) ×2 IMPLANT
CATH INFINITI 5 FR IM (CATHETERS) ×2 IMPLANT
CATH INFINITI 5FR MULTPACK ANG (CATHETERS) ×2 IMPLANT
DEVICE RAD COMP TR BAND LRG (VASCULAR PRODUCTS) ×2 IMPLANT
GLIDESHEATH SLEND SS 6F .021 (SHEATH) ×2 IMPLANT
GUIDEWIRE INQWIRE 1.5J.035X260 (WIRE) IMPLANT
INQWIRE 1.5J .035X260CM (WIRE) ×3
KIT HEART LEFT (KITS) ×4 IMPLANT
PACK CARDIAC CATHETERIZATION (CUSTOM PROCEDURE TRAY) ×4 IMPLANT
SHEATH GLIDE SLENDER 4/5FR (SHEATH) ×2 IMPLANT
SHEATH PROBE COVER 6X72 (BAG) ×2 IMPLANT
TRANSDUCER W/STOPCOCK (MISCELLANEOUS) ×4 IMPLANT
TUBING CIL FLEX 10 FLL-RA (TUBING) ×4 IMPLANT

## 2021-04-16 NOTE — Interval H&P Note (Signed)
Cath Lab Visit (complete for each Cath Lab visit)  Clinical Evaluation Leading to the Procedure:   ACS: No.  Non-ACS:    Anginal Classification: CCS III  Anti-ischemic medical therapy: Minimal Therapy (1 class of medications)  Non-Invasive Test Results: Intermediate-risk stress test findings: cardiac mortality 1-3%/year  Prior CABG: No previous CABG  Significant RV dysfunction and pulm HTN on echo    History and Physical Interval Note:  04/16/2021 1:45 PM  [name]  has presented today for surgery, with the diagnosis of pulmonary HTN, CAD.  The various methods of treatment have been discussed with the patient and family. After consideration of risks, benefits and other options for treatment, the patient has consented to  Procedure(s): RIGHT/LEFT HEART CATH AND CORONARY/GRAFT ANGIOGRAPHY (N/A) as a surgical intervention.  The patient's history has been reviewed, patient examined, no change in status, stable for surgery.  I have reviewed the patient's chart and labs.  Questions were answered to the patient's satisfaction.     Larae Grooms

## 2021-04-17 ENCOUNTER — Encounter (HOSPITAL_COMMUNITY): Payer: Self-pay | Admitting: Interventional Cardiology

## 2021-04-27 ENCOUNTER — Encounter: Payer: Self-pay | Admitting: Family Medicine

## 2021-04-27 ENCOUNTER — Ambulatory Visit: Payer: No Typology Code available for payment source | Attending: Family Medicine | Admitting: Family Medicine

## 2021-04-27 ENCOUNTER — Other Ambulatory Visit: Payer: Self-pay

## 2021-04-27 VITALS — BP 153/84 | HR 68 | Ht 65.0 in | Wt 243.8 lb

## 2021-04-27 DIAGNOSIS — I272 Pulmonary hypertension, unspecified: Secondary | ICD-10-CM | POA: Diagnosis not present

## 2021-04-27 DIAGNOSIS — Z23 Encounter for immunization: Secondary | ICD-10-CM

## 2021-04-27 DIAGNOSIS — E1159 Type 2 diabetes mellitus with other circulatory complications: Secondary | ICD-10-CM

## 2021-04-27 DIAGNOSIS — I152 Hypertension secondary to endocrine disorders: Secondary | ICD-10-CM | POA: Diagnosis not present

## 2021-04-27 DIAGNOSIS — Z794 Long term (current) use of insulin: Secondary | ICD-10-CM | POA: Diagnosis not present

## 2021-04-27 DIAGNOSIS — Z720 Tobacco use: Secondary | ICD-10-CM

## 2021-04-27 DIAGNOSIS — F17210 Nicotine dependence, cigarettes, uncomplicated: Secondary | ICD-10-CM

## 2021-04-27 DIAGNOSIS — Z1231 Encounter for screening mammogram for malignant neoplasm of breast: Secondary | ICD-10-CM

## 2021-04-27 LAB — POCT GLYCOSYLATED HEMOGLOBIN (HGB A1C): HbA1c, POC (controlled diabetic range): 7.8 % — AB (ref 0.0–7.0)

## 2021-04-27 LAB — GLUCOSE, POCT (MANUAL RESULT ENTRY): POC Glucose: 98 mg/dl (ref 70–99)

## 2021-04-27 MED ORDER — TOUJEO MAX SOLOSTAR 300 UNIT/ML ~~LOC~~ SOPN
55.0000 [IU] | PEN_INJECTOR | Freq: Every day | SUBCUTANEOUS | 6 refills | Status: DC
  Filled 2021-04-27: qty 18, 98d supply, fill #0
  Filled 2021-06-12: qty 6, 32d supply, fill #0
  Filled 2021-07-16: qty 18, 98d supply, fill #0
  Filled 2021-12-08: qty 18, 98d supply, fill #1

## 2021-04-27 MED ORDER — LISINOPRIL 20 MG PO TABS
10.0000 mg | ORAL_TABLET | Freq: Every day | ORAL | 6 refills | Status: DC
  Filled 2021-04-27: qty 45, 90d supply, fill #0
  Filled 2021-06-18 – 2021-06-23 (×2): qty 45, 90d supply, fill #1
  Filled 2021-09-28: qty 45, 90d supply, fill #2
  Filled 2022-01-01: qty 45, 90d supply, fill #3

## 2021-04-27 NOTE — Progress Notes (Signed)
Blood sugar is low in the morning. Needs medication refills.

## 2021-04-27 NOTE — Progress Notes (Signed)
Subjective:  Patient ID: Deborah Coffey, female    DOB: June 26, 1955  Age: 66 y.o. MRN: [id number]  CC: Diabetes   HPI Deborah Coffey is a 66 y.o. year old female with a history of Type 2 DM (A1c 9.1), Diabetic neuropathy, Hypertension, CAD (s/p CABG), history of COVID. here for a follow-up visit   Interval History: She underwent a cardiac cath on 04/16/2021 which revealed: Conclusion      Mid LAD lesion is 75% stenosed.  LIMA to LAD is widely patent.   Prox RCA lesion is 50% stenosed.   The left ventricular systolic function is normal.   LV end diastolic pressure is normal.   The left ventricular ejection fraction is greater than 65% by visual estimate.   Hemodynamic findings consistent with moderate pulmonary hypertension.   There is no aortic valve stenosis.   Ao sat 97%, PA sat 70%, RA mean pressure 5 mm Hg; RV 79/0; PA pressure 79/18, mean PA 39 mm Hg; PCWP 11/12; CO 6.4 L/min; CI 2.99.   Patent LIMA to LAD.  No significant obstructive disease.     Moderate pulmonary hypertension.    Continue medical therapy.     A1c is 7.8 down from 9.4. She has not been getting Wilder Glade from the Pharmacy but has been administering 57 units of Lantus along with her Metformin. She complains her blood sugar drops low in the morning but on further questioning she does not check her blood sugars. She just states she knows it gets low. She would like prescription for a CGM. She has neuropathy in her feet for which she is on Gabapentin. Not up to date on her eye exams and states she needs to make an Ophthalmology appointment.  Her blood pressure is elevated and she states it has been high at home as well. She has no chest pain or dyspnea and has an upcoming appointment with Cardiology. She smokes 10-15 cig/day. She smoked since she was 18.  Past Medical History:  Diagnosis Date   Allergy    Anemia    CAD in native artery 02/18/2004   Mid LAD lesoin just after Large D1 --> CABG x 1  LIMA-LAD   CAP (community acquired pneumonia) 08/07/2014   Chronic kidney disease, stage 3b ([place])    Depression    High cholesterol    History of blood transfusion 04/20/2003   "related to [facility]"    Hypertension    Lung nodules    Mild carotid artery disease ([place])    Pulmonary hypertension ([place])    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 competitive flow   TIA (transient ischemic attack)    Type II diabetes mellitus ([place])     Past Surgical History:  Procedure Laterality Date   CARDIAC CATHETERIZATION  2010   Previous LAD 95% lesion - now ~30-40%; patent LIMA with competitive flow   CESAREAN SECTION  1977; 1989   CHOLECYSTECTOMY  ~ 2012   CORONARY ARTERY BYPASS GRAFT  02/2004   "CABG X1" (03/22/2013); LIMA-LAD   RIGHT/LEFT HEART CATH AND CORONARY/GRAFT ANGIOGRAPHY N/A 04/16/2021   Procedure: RIGHT/LEFT HEART CATH AND CORONARY/GRAFT ANGIOGRAPHY;  Surgeon: Jettie Booze, MD;  Location: [facility];  Service: Cardiovascular;  Laterality: N/A;   TUBAL LIGATION  1989    Family History  Problem Relation Age of Onset   Heart disease Mother    Diabetes Mother    Cancer Mother  cervical   Diabetes Father    Diabetes Sister    Heart disease Sister    Diabetes Brother    Heart attack Brother    Lupus Sister    Heart failure Sister    Hepatitis C Sister    Heart failure Brother    Diabetes Brother    Hypertension Daughter    Colon cancer Neg Hx     Allergies  Allergen Reactions   Ciprofloxacin Rash    Outpatient Medications Prior to Visit  Medication Sig Dispense Refill   ACCU-CHEK FASTCLIX LANCETS MISC Use as directed to check blood glucose 3 times daily. E11.9 100 each 5   acetaminophen (TYLENOL) 500 MG tablet Take 1,000 mg by mouth every 6 (six) hours as needed for headache (pain).      Alcohol Swabs (B-D SINGLE USE SWABS REGULAR) PADS USE AS DIRECTED 100 each 0   aspirin EC 81 MG tablet Take 81 mg by  mouth daily.     atorvastatin (LIPITOR) 10 MG tablet Take 1 tablet (10 mg total) by mouth daily. 90 tablet 1   Blood Glucose Monitoring Suppl (ACCU-CHEK NANO SMARTVIEW) w/Device KIT Use as directed to check blood glucose 3 times daily. E11.9 1 kit 0   clobetasol cream (TEMOVATE) 0.05 % APPLY TOPICALLY 2 (TWO) TIMES DAILY. 60 g 0   cyclobenzaprine (FLEXERIL) 10 MG tablet Take 1 tablet (10 mg total) by mouth 2 (two) times daily as needed for muscle spasms. 20 tablet 0   dapagliflozin propanediol (FARXIGA) 5 MG TABS tablet Take 1 tablet (5 mg total) by mouth daily before breakfast. 90 tablet 1   furosemide (LASIX) 20 MG tablet Take 2 tablets (40 mg total) by mouth daily. 180 tablet 3   gabapentin (NEURONTIN) 300 MG capsule Take orally 2 caps (600 mg) in the morning and 3 capsules (900 mg) in the evening 450 capsule 1   glucose blood (ACCU-CHEK SMARTVIEW) test strip Use as instructed to test blood glucose 3 times daily. E11.9 100 each 12   insulin glargine, 2 Unit Dial, (TOUJEO MAX SOLOSTAR) 300 UNIT/ML Solostar Pen Inject 57 Units into the skin daily.     Insulin Pen Needle (DROPLET PEN NEEDLES) 31G X 8 MM MISC USE WITH TOUJEO SOLOSTAR PEN EVERY DAY 100 each 4   lisinopril (ZESTRIL) 10 MG tablet Take 1 tablet (10 mg total) by mouth daily. 90 tablet 1   metFORMIN (GLUCOPHAGE-XR) 500 MG 24 hr tablet Take 2 tablets (1,000 mg total) by mouth 2 (two) times daily. 360 tablet 1   metoprolol tartrate (LOPRESSOR) 50 MG tablet Take 1 tablet (50 mg total) by mouth 2 (two) times daily. 180 tablet 1   Multiple Vitamin (MULTIVITAMIN WITH MINERALS) TABS tablet Take 1 tablet by mouth daily.     nitroGLYCERIN (NITROSTAT) 0.4 MG SL tablet Place 1 tablet (0.4 mg total) under the tongue every 5 (five) minutes as needed for chest pain. 25 tablet 3   pantoprazole (PROTONIX) 40 MG tablet Take 1 tablet (40 mg total) by mouth 2 (two) times daily. 180 tablet 1   No facility-administered medications prior to visit.      ROS Review of Systems  Constitutional:  Negative for activity change, appetite change and fatigue.  HENT:  Negative for congestion, sinus pressure and sore throat.   Eyes:  Negative for visual disturbance.  Respiratory:  Negative for cough, chest tightness, shortness of breath and wheezing.   Cardiovascular:  Negative for chest pain and palpitations.  Gastrointestinal:  Negative  for abdominal distention, abdominal pain and constipation.  Endocrine: Negative for polydipsia.  Genitourinary:  Negative for dysuria and frequency.  Musculoskeletal:  Negative for arthralgias and back pain.  Skin:  Negative for rash.  Neurological:  Negative for tremors, light-headedness and numbness.  Hematological:  Does not bruise/bleed easily.  Psychiatric/Behavioral:  Negative for agitation and behavioral problems.    Objective:  BP (!) 153/84    Pulse 68    Ht $R'5\' 5"'PW$  (1.651 m)    Wt 243 lb 12.8 oz (110.6 kg)    LMP  (LMP Unknown)    SpO2 98%    BMI 40.57 kg/m   BP/Weight 04/27/2021 04/16/2021 16/01/9603  Systolic BP 540 981 191  Diastolic BP 84 86 60  Wt. (Lbs) 243.8 240 242  BMI 40.57 39.94 40.27      Physical Exam Constitutional:      Appearance: She is well-developed.  Cardiovascular:     Rate and Rhythm: Normal rate.     Heart sounds: Normal heart sounds. No murmur heard. Pulmonary:     Effort: Pulmonary effort is normal.     Breath sounds: Normal breath sounds. No wheezing or rales.  Chest:     Chest wall: No tenderness.  Abdominal:     General: Bowel sounds are normal. There is no distension.     Palpations: Abdomen is soft. There is no mass.     Tenderness: There is no abdominal tenderness.  Musculoskeletal:        General: Normal range of motion.     Right lower leg: No edema.     Left lower leg: No edema.  Neurological:     Mental Status: She is alert and oriented to person, place, and time.  Psychiatric:        Mood and Affect: Mood normal.   Diabetic Foot Exam -  Simple   Simple Foot Form Diabetic Foot exam was performed with the following findings: Yes 04/27/2021  2:16 PM  Visual Inspection No deformities, no ulcerations, no other skin breakdown bilaterally: Yes Sensation Testing Intact to touch and monofilament testing bilaterally: Yes Pulse Check Posterior Tibialis and Dorsalis pulse intact bilaterally: Yes Comments     CMP Latest Ref Rng & Units 04/16/2021 04/16/2021 04/16/2021  Glucose 70 - 99 mg/dL - - -  BUN 8 - 23 mg/dL - - -  Creatinine 0.44 - 1.00 mg/dL - - -  Sodium 135 - 145 mmol/L 145 145 146(H)  Potassium 3.5 - 5.1 mmol/L 3.4(L) 3.5 3.4(L)  Chloride 98 - 111 mmol/L - - -  CO2 22 - 32 mmol/L - - -  Calcium 8.9 - 10.3 mg/dL - - -  Total Protein 6.0 - 8.5 g/dL - - -  Total Bilirubin 0.0 - 1.2 mg/dL - - -  Alkaline Phos 44 - 121 IU/L - - -  AST 0 - 40 IU/L - - -  ALT 0 - 32 IU/L - - -    Lipid Panel     Component Value Date/Time   CHOL 161 01/21/2021 1618   TRIG 104 01/21/2021 1618   HDL 72 01/21/2021 1618   CHOLHDL 2.2 01/21/2021 1618   CHOLHDL 2.2 09/02/2016 0533   VLDL 13 09/02/2016 0533   LDLCALC 70 01/21/2021 1618    CBC    Component Value Date/Time   WBC 6.2 04/10/2021 1248   WBC 4.0 06/13/2020 1042   RBC 4.44 04/10/2021 1248   RBC 4.25 06/13/2020 1042   HGB 11.2 (L) 04/16/2021  1425   HGB 12.1 04/10/2021 1248   HCT 33.0 (L) 04/16/2021 1425   HCT 37.0 04/10/2021 1248   PLT 261 04/10/2021 1248   MCV 83 04/10/2021 1248   MCH 27.3 04/10/2021 1248   MCH 26.4 06/13/2020 1042   MCHC 32.7 04/10/2021 1248   MCHC 30.1 06/13/2020 1042   RDW 13.7 04/10/2021 1248   LYMPHSABS 1.9 01/21/2021 1618   MONOABS 0.4 01/23/2020 1749   EOSABS 0.1 01/21/2021 1618   BASOSABS 0.0 01/21/2021 1618    Lab Results  Component Value Date   HGBA1C 7.8 (A) 04/27/2021    Assessment & Plan:  1. Type 2 diabetes mellitus with other circulatory complication, with long-term current use of insulin (HCC) Not fully optimized with  A1c of 7.8; goal is 7.0 Due to ongoing hypoglycemia I have advised her to decrease her Lantus by 2 units from 57 units to 55 units.  Advised that if hypoglycemia persists she can decrease by an additional 2 units. She never picked of Wilder Glade and has been advised to pick this up and commence it. - POCT glucose (manual entry) - POCT glycosylated hemoglobin (Hb A1C) - insulin glargine, 2 Unit Dial, (TOUJEO MAX SOLOSTAR) 300 UNIT/ML Solostar Pen; Inject 55 Units into the skin daily.  Dispense: 30 mL; Refill: 6  2. Encounter for screening mammogram for malignant neoplasm of breast - MM DIGITAL SCREENING BILATERAL; Future  3. Hypertension associated with diabetes ([place]) Uncontrolled Increase lisinopril from 10 mg to 20 mg Will check potassium at next visit Counseled on blood pressure goal of less than 130/80, low-sodium, DASH diet, medication compliance, 150 minutes of moderate intensity exercise per week. Discussed medication compliance, adverse effects. - lisinopril (ZESTRIL) 20 MG tablet; Take 0.5 tablets (10 mg total) by mouth daily.  Dispense: 30 tablet; Refill: 6  4. Tobacco use Spent 3 minutes counseling on smoking cessation and she will be thinking of working on quitting  5. Need for pneumococcal vaccine - Pneumococcal conjugate vaccine 20-valent  6. Pulmonary hypertension ([place]) Finding on recent cardiac cath Asymptomatic at the moment Followed by cardiology   7. Need for immunization against influenza - Flu Vaccine QUAD 50mo+IM (Fluarix, Fluzone & Alfiuria Quad PF)    No orders of the defined types were placed in this encounter.   Follow-up: Return in about 1 month (around 05/28/2021) for Pap smear.       Charlott Rakes, MD, FAAFP. [facility] [place], [place]   04/27/2021, 1:53 PM

## 2021-04-28 ENCOUNTER — Telehealth: Payer: Self-pay | Admitting: Family Medicine

## 2021-04-28 ENCOUNTER — Encounter: Payer: Self-pay | Admitting: Family Medicine

## 2021-04-28 DIAGNOSIS — E1159 Type 2 diabetes mellitus with other circulatory complications: Secondary | ICD-10-CM

## 2021-04-28 DIAGNOSIS — Z794 Long term (current) use of insulin: Secondary | ICD-10-CM

## 2021-04-28 NOTE — Telephone Encounter (Signed)
Can you please assist with CGM ordering? Thanks

## 2021-04-29 ENCOUNTER — Other Ambulatory Visit: Payer: Self-pay

## 2021-04-29 ENCOUNTER — Other Ambulatory Visit: Payer: Self-pay | Admitting: Pharmacist

## 2021-04-29 DIAGNOSIS — E1159 Type 2 diabetes mellitus with other circulatory complications: Secondary | ICD-10-CM

## 2021-04-29 DIAGNOSIS — Z794 Long term (current) use of insulin: Secondary | ICD-10-CM

## 2021-04-29 MED ORDER — DEXCOM G6 RECEIVER DEVI
3 refills | Status: DC
  Filled 2021-04-29: qty 1, 30d supply, fill #0

## 2021-04-29 MED ORDER — DEXCOM G6 TRANSMITTER MISC
3 refills | Status: DC
  Filled 2021-04-29: qty 1, fill #0

## 2021-04-29 MED ORDER — DEXCOM G6 RECEIVER DEVI: 3 refills | Status: DC

## 2021-04-29 MED ORDER — DEXCOM G6 SENSOR MISC
3 refills | Status: DC
  Filled 2021-04-29: qty 3, fill #0

## 2021-04-29 MED ORDER — DEXCOM G6 SENSOR MISC: 3 refills | Status: DC

## 2021-04-29 MED ORDER — DEXCOM G6 TRANSMITTER MISC: 3 refills | Status: DC

## 2021-04-29 NOTE — Addendum Note (Signed)
Addended by: Daisy Blossom, Annie Main L on: 04/29/2021 08:50 AM   Modules accepted: Orders

## 2021-04-29 NOTE — Telephone Encounter (Signed)
Dexcom supplies sent. May likely require PA approval, however, our pharmacy is aware and will work on approval.

## 2021-04-30 ENCOUNTER — Other Ambulatory Visit: Payer: Self-pay | Admitting: Family Medicine

## 2021-04-30 ENCOUNTER — Other Ambulatory Visit: Payer: Self-pay

## 2021-04-30 DIAGNOSIS — E1159 Type 2 diabetes mellitus with other circulatory complications: Secondary | ICD-10-CM

## 2021-05-01 ENCOUNTER — Other Ambulatory Visit: Payer: Self-pay

## 2021-05-05 ENCOUNTER — Other Ambulatory Visit: Payer: Self-pay | Admitting: Pharmacist

## 2021-05-05 ENCOUNTER — Other Ambulatory Visit: Payer: Self-pay | Admitting: Physician Assistant

## 2021-05-05 ENCOUNTER — Other Ambulatory Visit: Payer: Self-pay

## 2021-05-05 MED ORDER — FUROSEMIDE 20 MG PO TABS
40.0000 mg | ORAL_TABLET | Freq: Every day | ORAL | 2 refills | Status: DC
  Filled 2021-05-05: qty 60, 30d supply, fill #0
  Filled 2021-06-11: qty 60, 30d supply, fill #1
  Filled 2021-07-16: qty 60, 30d supply, fill #2

## 2021-05-06 ENCOUNTER — Other Ambulatory Visit: Payer: Self-pay

## 2021-05-07 ENCOUNTER — Other Ambulatory Visit: Payer: Self-pay

## 2021-05-14 ENCOUNTER — Ambulatory Visit (INDEPENDENT_AMBULATORY_CARE_PROVIDER_SITE_OTHER): Payer: No Typology Code available for payment source | Admitting: Cardiology

## 2021-05-14 ENCOUNTER — Other Ambulatory Visit: Payer: Self-pay

## 2021-05-14 ENCOUNTER — Encounter: Payer: Self-pay | Admitting: Cardiology

## 2021-05-14 DIAGNOSIS — I2510 Atherosclerotic heart disease of native coronary artery without angina pectoris: Secondary | ICD-10-CM

## 2021-05-14 DIAGNOSIS — I10 Essential (primary) hypertension: Secondary | ICD-10-CM | POA: Diagnosis not present

## 2021-05-14 DIAGNOSIS — I2729 Other secondary pulmonary hypertension: Secondary | ICD-10-CM

## 2021-05-14 NOTE — Assessment & Plan Note (Signed)
Right heart catheterization on 04/16/2021 by Dr. Irish Lack showed RV pressure of 79/0, PA pressure 79/18, mean PA of 39 mmHg with wedge of 12, cardiac output of 6.4 L/min.  She is going to be seeing Dr. Haroldine Laws to discuss potential other therapies.  She has had a CT angiogram of her chest that showed no evidence of pulmonary embolism.  Fatigue and shortness of breath noted, not severe.

## 2021-05-14 NOTE — Assessment & Plan Note (Signed)
Overall reasonable Control.  Furosemide 40 mg a day lisinopril 10 mg a day metoprolol 50 mg twice a day

## 2021-05-14 NOTE — Patient Instructions (Signed)

## 2021-05-14 NOTE — Assessment & Plan Note (Signed)
Reassuring patent LIMA to LAD.  Proximal RCA 50%.  Continue with aggressive medical management.  Aspirin 81 mg.  Atorvastatin.

## 2021-05-14 NOTE — Progress Notes (Signed)
Cardiology Office Note:    Date:  05/14/2021   ID:  Deborah Coffey, DOB 1955/09/28, MRN [id number]  PCP:  Charlott Rakes, MD   [facility] Providers Cardiologist:  Candee Furbish, MD     Referring MD: Charlott Rakes, MD    History of Present Illness:    Deborah Coffey is a 66 y.o. female here for the follow-up of severe pulmonary hypertension and to discuss her recent catheterization results (04/16/21).  Previously here for follow-up of severe pulmonary hypertension.  Echocardiogram demonstrated estimated pulmonary pressures of 110 mmHg.  This is quite a significant elevation compared to prior.  Her pulmonary hypertension certainly could be multifactorial. No evidence of pulmonary embolism on CT scan 2/22.  CAD s/p CABGx1 (LIMA-LAD) 4496, chronic diastolic CHF, anemia, depression, HLD, HTN, DM, CKD stage 3b, prior TIAs, pulmonary HTN (felt due to underlying lung disease/smoking), morbid obesity, mild carotid disease, lung nodules by CT 05/2020   She had prior bypass in 2005 with last cath 2010 showing patent LIMA, 40-50% mRCA, normal LCX, normal aortoiliac system. Last nuc 02/2018 was felt to be low risk without ischemia.   Today: Overall, she appears well. Generally she continues to suffer from shortness of breath and fatigue.   Next week (1/31) she has an appointment with the advanced heart failure clinic. She will see Dr. Haroldine Laws  She denies any palpitations, or chest pain. No lightheadedness, headaches, syncope, orthopnea, PND, lower extremity edema or exertional symptoms.   Past Medical History:  Diagnosis Date   Allergy    Anemia    CAD in native artery 02/18/2004   Mid LAD lesoin just after Large D1 --> CABG x 1 LIMA-LAD   CAP (community acquired pneumonia) 08/07/2014   Chronic kidney disease, stage 3b ([place])    Depression    High cholesterol    History of blood transfusion 04/20/2003   "related to OHS"    Hypertension    Lung nodules    Mild carotid  artery disease ([place])    Pulmonary hypertension ([place])    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 competitive flow   TIA (transient ischemic attack)    Type II diabetes mellitus ([place])     Past Surgical History:  Procedure Laterality Date   CARDIAC CATHETERIZATION  2010   Previous LAD 95% lesion - now ~30-40%; patent LIMA with competitive flow   CESAREAN SECTION  1977; 1989   CHOLECYSTECTOMY  ~ 2012   CORONARY ARTERY BYPASS GRAFT  02/2004   "CABG X1" (03/22/2013); LIMA-LAD   RIGHT/LEFT HEART CATH AND CORONARY/GRAFT ANGIOGRAPHY N/A 04/16/2021   Procedure: RIGHT/LEFT HEART CATH AND CORONARY/GRAFT ANGIOGRAPHY;  Surgeon: Jettie Booze, MD;  Location: [facility];  Service: Cardiovascular;  Laterality: N/A;   TUBAL LIGATION  1989    Current Medications: Current Meds  Medication Sig   ACCU-CHEK FASTCLIX LANCETS MISC Use as directed to check blood glucose 3 times daily. E11.9   acetaminophen (TYLENOL) 500 MG tablet Take 1,000 mg by mouth every 6 (six) hours as needed for headache (pain).    Alcohol Swabs (B-D SINGLE USE SWABS REGULAR) PADS USE AS DIRECTED   aspirin EC 81 MG tablet Take 81 mg by mouth daily.   atorvastatin (LIPITOR) 10 MG tablet Take 1 tablet (10 mg total) by mouth daily.   Blood Glucose Monitoring Suppl (ACCU-CHEK NANO SMARTVIEW) w/Device KIT Use as directed to check blood glucose 3 times daily. E11.9  clobetasol cream (TEMOVATE) 0.05 % APPLY TOPICALLY 2 (TWO) TIMES DAILY.   Continuous Blood Gluc Receiver (DEXCOM G6 RECEIVER) DEVI Use to check blood sugar three times daily.   Continuous Blood Gluc Sensor (DEXCOM G6 SENSOR) MISC Use to check blood sugar three times daily. Dx. E11.59   Continuous Blood Gluc Transmit (DEXCOM G6 TRANSMITTER) MISC Use to check blood sugar three times daily. Dx. E11.59   cyclobenzaprine (FLEXERIL) 10 MG tablet Take 1 tablet (10 mg total) by mouth 2 (two) times daily as needed for  muscle spasms.   dapagliflozin propanediol (FARXIGA) 5 MG TABS tablet Take 1 tablet (5 mg total) by mouth daily before breakfast.   furosemide (LASIX) 20 MG tablet Take 2 tablets (40 mg total) by mouth daily.   gabapentin (NEURONTIN) 300 MG capsule Take orally 2 caps (600 mg) in the morning and 3 capsules (900 mg) in the evening   glucose blood (ACCU-CHEK SMARTVIEW) test strip Use as instructed to test blood glucose 3 times daily. E11.9   insulin glargine, 2 Unit Dial, (TOUJEO MAX SOLOSTAR) 300 UNIT/ML Solostar Pen Inject 55 Units into the skin daily.   Insulin Pen Needle (DROPLET PEN NEEDLES) 31G X 8 MM MISC USE WITH TOUJEO SOLOSTAR PEN EVERY DAY   lisinopril (ZESTRIL) 20 MG tablet Take 0.5 tablets (10 mg total) by mouth daily.   metFORMIN (GLUCOPHAGE-XR) 500 MG 24 hr tablet Take 2 tablets (1,000 mg total) by mouth 2 (two) times daily.   metoprolol tartrate (LOPRESSOR) 50 MG tablet Take 1 tablet (50 mg total) by mouth 2 (two) times daily.   Multiple Vitamin (MULTIVITAMIN WITH MINERALS) TABS tablet Take 1 tablet by mouth daily.   nitroGLYCERIN (NITROSTAT) 0.4 MG SL tablet Place 1 tablet (0.4 mg total) under the tongue every 5 (five) minutes as needed for chest pain.   pantoprazole (PROTONIX) 40 MG tablet Take 1 tablet (40 mg total) by mouth 2 (two) times daily.     Allergies:   Ciprofloxacin   Social History   Socioeconomic History   Marital status: Divorced    Spouse name: Not on file   Number of children: Not on file   Years of education: Not on file   Highest education level: Not on file  Occupational History   Not on file  Tobacco Use   Smoking status: Some Days    Packs/day: 0.50    Years: 42.00    Pack years: 21.00    Types: Cigarettes   Smokeless tobacco: Never  Vaping Use   Vaping Use: Never used  Substance and Sexual Activity   Alcohol use: Yes    Alcohol/week: 1.0 - 2.0 standard drink    Types: 1 - 2 Glasses of wine per week    Comment: 08/07/2014 "glass of wine maybe  2 times/yr"   Drug use: No    Types: Marijuana    Comment: smoked pot years ago but not currently   Sexual activity: Not Currently  Other Topics Concern   Not on file  Social History Narrative   Not on file   Social Determinants of Health   Financial Resource Strain: Not on file  Food Insecurity: Not on file  Transportation Needs: Not on file  Physical Activity: Not on file  Stress: Not on file  Social Connections: Not on file     Family History: The patient's family history includes Cancer in her mother; Diabetes in her brother, brother, father, mother, and sister; Heart attack in her brother; Heart disease in her  mother and sister; Heart failure in her brother and sister; Hepatitis C in her sister; Hypertension in her daughter; Lupus in her sister. There is no history of Colon cancer.  ROS:   Please see the history of present illness. (+) Shortness of breath (+) Fatigue All other systems are reviewed and negative.    EKGs/Labs/Other Studies Reviewed:    The following studies were reviewed today:  Right/Left Heart Cath 04/16/2021:   Mid LAD lesion is 75% stenosed.  LIMA to LAD is widely patent.   Prox RCA lesion is 50% stenosed.   The left ventricular systolic function is normal.   LV end diastolic pressure is normal.   The left ventricular ejection fraction is greater than 65% by visual estimate.   Hemodynamic findings consistent with moderate pulmonary hypertension.   There is no aortic valve stenosis.   Ao sat 97%, PA sat 70%, RA mean pressure 5 mm Hg; RV 79/0; PA pressure 79/18, mean PA 39 mm Hg; PCWP 11/12; CO 6.4 L/min; CI 2.99.   Patent LIMA to LAD.  No significant obstructive disease.     Moderate pulmonary hypertension.    Continue medical therapy.    Diagnostic Dominance: Right   [place] 03/26/21: No adverse arrhythmias Occasional PVCs, 2.8%., Rare PACs. Rare episodes of atrial tachycardia No evidence of atrial fibrillation. Continuing with  metoprolol.  ECHO 03/26/21: 1. Left ventricular ejection fraction, by estimation, is 65 to 70%. The  left ventricle has normal function. The left ventricle has no regional  wall motion abnormalities. Left ventricular diastolic parameters were  normal. There is the interventricular  septum is flattened in systole and diastole, consistent with right  ventricular pressure and volume overload.   2. Right ventricular systolic function is moderately reduced. The right  ventricular size is moderately enlarged. There is severely elevated  pulmonary artery systolic pressure. The estimated right ventricular  systolic pressure is 111.6 mmHg.   3. Right atrial size was moderately dilated.   4. The mitral valve is normal in structure. No evidence of mitral valve  regurgitation.   5. Tricuspid valve regurgitation is moderate.   6. The aortic valve is grossly normal. Aortic valve regurgitation is not  visualized. No aortic stenosis is present.   7. The inferior vena cava is dilated in size with >50% respiratory  variability, suggesting right atrial pressure of 8 mmHg.   Comparison(s): Prior images reviewed side by side. The right ventricular  systolic function is significantly worse. Pulmonary pressure could not be  estimated on the previous study.   Nuclear Stress Test 03/07/2018: Nuclear stress EF: 42%. The visual LV EF appears to be normal is likely 60-65%. Suggest echo for further assessment of LV function There was no ST segment deviation noted during stress. The study is normal. This is a low risk study. There is no evidence of ischemia and no evidence of previous infarction  EKG:   EKG is personally reviewed and interpreted. 05/14/2021: EKG was not ordered. 04/10/2021: sinus rhythm 88 T wave version V2  Recent Labs: 06/13/2020: B Natriuretic Peptide 365.0 01/21/2021: ALT 16 02/27/2021: NT-Pro BNP 1,633; TSH 1.510 04/10/2021: Platelets 261 04/16/2021: BUN 12; Creatinine, Ser 0.99;  Hemoglobin 11.2; Potassium 3.4; Sodium 145   Recent Lipid Panel    Component Value Date/Time   CHOL 161 01/21/2021 1618   TRIG 104 01/21/2021 1618   HDL 72 01/21/2021 1618   CHOLHDL 2.2 01/21/2021 1618   CHOLHDL 2.2 09/02/2016 0533   VLDL 13 09/02/2016 0533  [place] 70 01/21/2021 1618            Physical Exam:    VS:  BP 140/70 (BP Location: Left Arm, Patient Position: Sitting, Cuff Size: Normal)    Pulse 86    Ht $R'5\' 5"'JC$  (1.651 m)    Wt 248 lb (112.5 kg)    LMP  (LMP Unknown)    BMI 41.27 kg/m     Wt Readings from Last 3 Encounters:  05/14/21 248 lb (112.5 kg)  04/27/21 243 lb 12.8 oz (110.6 kg)  04/16/21 240 lb (108.9 kg)     GEN:  Well nourished, well developed in no acute distress HEENT: Normal NECK: No JVD; No carotid bruits LYMPHATICS: No lymphadenopathy CARDIAC: RRR, no murmurs, no rubs, gallops, subtle split S2 RESPIRATORY:  Clear to auscultation without rales ABDOMEN: Soft, non-tender, non-distended MUSCULOSKELETAL:  No edema; No deformity  SKIN: Warm and dry NEUROLOGIC:  Alert and oriented x 3 PSYCHIATRIC:  Normal affect   ASSESSMENT:    1. Other secondary pulmonary hypertension ([place])   2. Coronary artery disease involving native coronary artery of native heart without angina pectoris   3. Essential hypertension     PLAN:    In order of problems listed above:  Other secondary pulmonary hypertension ([place]) Right heart catheterization on 04/16/2021 by Dr. Irish Lack showed RV pressure of 79/0, PA pressure 79/18, mean PA of 39 mmHg with wedge of 12, cardiac output of 6.4 L/min.  She is going to be seeing Dr. Haroldine Laws to discuss potential other therapies.  She has had a CT angiogram of her chest that showed no evidence of pulmonary embolism.  Fatigue and shortness of breath noted, not severe.  CAD (coronary artery disease) - CABG x 1 (LIMA-LAD) Reassuring patent LIMA to LAD.  Proximal RCA 50%.  Continue with aggressive medical management.  Aspirin 81 mg.   Atorvastatin.  Essential hypertension Overall reasonable Control.  Furosemide 40 mg a day lisinopril 10 mg a day metoprolol 50 mg twice a day    Follow-up: 6 months   Medication Adjustments/Labs and Tests Ordered: Current medicines are reviewed at length with the patient today.  Concerns regarding medicines are outlined above.   No orders of the defined types were placed in this encounter.  No orders of the defined types were placed in this encounter.  Patient Instructions  Medication Instructions:  The current medical regimen is effective;  continue present plan and medications.  *If you need a refill on your cardiac medications before your next appointment, please call your pharmacy*  Follow-Up: At [facility], you and your health needs are our priority.  As part of our continuing mission to provide you with exceptional heart care, we have created designated Provider Care Teams.  These Care Teams include your primary Cardiologist (physician) and Advanced Practice Providers (APPs -  Physician Assistants and Nurse Practitioners) who all work together to provide you with the care you need, when you need it.  We recommend signing up for the patient portal called "MyChart".  Sign up information is provided on this After Visit Summary.  MyChart is used to connect with patients for Virtual Visits (Telemedicine).  Patients are able to view lab/test results, encounter notes, upcoming appointments, etc.  Non-urgent messages can be sent to your provider as well.   To learn more about what you can do with MyChart, go to [email or web address].    Your next appointment:   6 month(s)  The format for your next appointment:   In  Person  Provider:   Candee Furbish, MD     Thank you for choosing [facility]!!      I,Mathew Stumpf,acting as a scribe for Candee Furbish, MD.,have documented all relevant documentation on the behalf of Candee Furbish, MD,as directed by  Candee Furbish, MD  while in the presence of Candee Furbish, MD.  I, Candee Furbish, MD, have reviewed all documentation for this visit. The documentation on 05/14/21 for the exam, diagnosis, procedures, and orders are all accurate and complete.   Signed, Candee Furbish, MD  05/14/2021 12:06 PM    [place]

## 2021-05-19 ENCOUNTER — Ambulatory Visit (HOSPITAL_COMMUNITY)
Admit: 2021-05-19 | Discharge: 2021-05-19 | Disposition: A | Discharge status: Home / Self Care | Payer: No Typology Code available for payment source | Attending: Internal Medicine | Admitting: Internal Medicine

## 2021-05-19 ENCOUNTER — Other Ambulatory Visit: Payer: Self-pay

## 2021-05-19 ENCOUNTER — Encounter (HOSPITAL_COMMUNITY): Payer: Self-pay | Admitting: Internal Medicine

## 2021-05-19 VITALS — BP 142/80 | HR 77 | Wt 248.6 lb

## 2021-05-19 DIAGNOSIS — I493 Ventricular premature depolarization: Secondary | ICD-10-CM | POA: Diagnosis not present

## 2021-05-19 DIAGNOSIS — Z720 Tobacco use: Secondary | ICD-10-CM | POA: Diagnosis not present

## 2021-05-19 DIAGNOSIS — Z951 Presence of aortocoronary bypass graft: Secondary | ICD-10-CM | POA: Diagnosis not present

## 2021-05-19 DIAGNOSIS — Z8249 Family history of ischemic heart disease and other diseases of the circulatory system: Secondary | ICD-10-CM | POA: Insufficient documentation

## 2021-05-19 DIAGNOSIS — R0683 Snoring: Secondary | ICD-10-CM | POA: Insufficient documentation

## 2021-05-19 DIAGNOSIS — Z79899 Other long term (current) drug therapy: Secondary | ICD-10-CM | POA: Insufficient documentation

## 2021-05-19 DIAGNOSIS — J984 Other disorders of lung: Secondary | ICD-10-CM | POA: Insufficient documentation

## 2021-05-19 DIAGNOSIS — E1122 Type 2 diabetes mellitus with diabetic chronic kidney disease: Secondary | ICD-10-CM | POA: Insufficient documentation

## 2021-05-19 DIAGNOSIS — M2550 Pain in unspecified joint: Secondary | ICD-10-CM | POA: Insufficient documentation

## 2021-05-19 DIAGNOSIS — Z8673 Personal history of transient ischemic attack (TIA), and cerebral infarction without residual deficits: Secondary | ICD-10-CM | POA: Insufficient documentation

## 2021-05-19 DIAGNOSIS — I2510 Atherosclerotic heart disease of native coronary artery without angina pectoris: Secondary | ICD-10-CM | POA: Diagnosis not present

## 2021-05-19 DIAGNOSIS — I491 Atrial premature depolarization: Secondary | ICD-10-CM | POA: Insufficient documentation

## 2021-05-19 DIAGNOSIS — F17210 Nicotine dependence, cigarettes, uncomplicated: Secondary | ICD-10-CM | POA: Diagnosis not present

## 2021-05-19 DIAGNOSIS — Z833 Family history of diabetes mellitus: Secondary | ICD-10-CM | POA: Diagnosis not present

## 2021-05-19 DIAGNOSIS — I130 Hypertensive heart and chronic kidney disease with heart failure and stage 1 through stage 4 chronic kidney disease, or unspecified chronic kidney disease: Secondary | ICD-10-CM | POA: Diagnosis not present

## 2021-05-19 DIAGNOSIS — I5032 Chronic diastolic (congestive) heart failure: Secondary | ICD-10-CM | POA: Insufficient documentation

## 2021-05-19 DIAGNOSIS — M1990 Unspecified osteoarthritis, unspecified site: Secondary | ICD-10-CM | POA: Diagnosis not present

## 2021-05-19 DIAGNOSIS — I2729 Other secondary pulmonary hypertension: Secondary | ICD-10-CM | POA: Diagnosis not present

## 2021-05-19 DIAGNOSIS — N1832 Chronic kidney disease, stage 3b: Secondary | ICD-10-CM | POA: Diagnosis not present

## 2021-05-19 DIAGNOSIS — Z8616 Personal history of COVID-19: Secondary | ICD-10-CM | POA: Diagnosis not present

## 2021-05-19 DIAGNOSIS — F32A Depression, unspecified: Secondary | ICD-10-CM | POA: Insufficient documentation

## 2021-05-19 DIAGNOSIS — E785 Hyperlipidemia, unspecified: Secondary | ICD-10-CM | POA: Insufficient documentation

## 2021-05-19 DIAGNOSIS — Z7982 Long term (current) use of aspirin: Secondary | ICD-10-CM | POA: Diagnosis not present

## 2021-05-19 DIAGNOSIS — D649 Anemia, unspecified: Secondary | ICD-10-CM | POA: Insufficient documentation

## 2021-05-19 NOTE — Progress Notes (Signed)
ADVANCED HF CLINIC CONSULT NOTE  Referring Physician:Mark Marlou Porch, MD Primary Care: Charlott Rakes, MD Primary Cardiologist: Candee Furbish, MD   HPI:  Deborah Coffey is a 66 y.o. with CAD s/p CABGx1 (LIMA-LAD) 2005, tobacco use, chronic diastolic CHF, anemia, depression, HLD, HTN, DM, CKD stage 3b, prior TIAs, pulmonary HTN (felt due to underlying lung disease/smoking), morbid obesity referred by Dr. Marlou Porch for further evaluation of her [place].    Echocardiogram 12/22 EF 65-70% RV moderately dilated with moderate HK. Flattened septum Moderate TR RVSP est 129mmHG    Chest CT scan 2/22. No PE   R/L cath on 04/16/21 - mLAD 75% LIMA to LAD is widely patent. pRCA 50 - EF 65% - RA 5 PA 79/18 (39) PCW 12  CO 6.4 L/min; CI PVR 4.2  Says she has been smoking since 66 y/o. Still smoking. Smokes ~ 1/2 ppd. 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 Gets SOB with just mild walking. Says she is breathing better lately. Doesn't wear O2. Denies h/o lupus or other CTD. Denies orthopnea or PND. Had syncopal episode with COVID in 1/22 but no other syncope. Lives with daughter and 3 grandkids. Snores loudly. Hasn't had sleep study yet. Does not have a lung doctore     Review of Systems: [y] = yes, [ ]  = no   General: Weight gain [ ] ; Weight loss [ ] ; Anorexia [ ] ; Fatigue [ y]; Fever [ ] ; Chills [ ] ; Weakness [ ]   Cardiac: Chest pain/pressure [ ] ; Resting SOB [ ] ; Exertional SOB [ y]; Orthopnea [ ] ; Pedal Edema [ ] ; Palpitations [ ] ; Syncope [ ] ; Presyncope [ ] ; Paroxysmal nocturnal dyspnea[ ]   Pulmonary: Cough [ ] ; Wheezing[ ] ; Hemoptysis[ ] ; Sputum [ ] ; Snoring [ y]  GI: Vomiting[ ] ; Dysphagia[ ] ; Melena[ ] ; Hematochezia [ ] ; Heartburn[ ] ; Abdominal pain [ ] ; Constipation [ ] ; Diarrhea [ ] ; BRBPR [ ]   GU: Hematuria[ ] ; Dysuria [ ] ; Nocturia[ ]   Vascular: Pain in legs with walking [ ] ; Pain in feet with lying flat [ ] ; Non-healing sores [ ] ; Stroke [ ] ; TIA [ ] ; Slurred speech [ ] ;  Neuro:  Headaches[ ] ; Vertigo[ ] ; Seizures[ ] ; Paresthesias[ ] ;Blurred vision [ ] ; Diplopia [ ] ; Vision changes [ ]   Ortho/Skin: Arthritis [ y]; Joint pain [ y]; Muscle pain [ ] ; Joint swelling [ ] ; Back Pain [ ] ; Rash [ ]   Psych: Depression[y ]; Anxiety[ ]   Heme: Bleeding problems [ ] ; Clotting disorders [ ] ; Anemia [ ]   Endocrine: Diabetes [ ] ; Thyroid dysfunction[ ]    Past Medical History:  Diagnosis Date   Allergy    Anemia    CAD in native artery 02/18/2004   Mid LAD lesoin just after Large D1 --> CABG x 1 LIMA-LAD   CAP (community acquired pneumonia) 08/07/2014   Chronic kidney disease, stage 3b ([place])    Depression    High cholesterol    History of blood transfusion 04/20/2003   "related to OHS"    Hypertension    Lung nodules    Mild carotid artery disease ([place])    Pulmonary hypertension ([place])    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 competitive flow   TIA (transient ischemic attack)    Type II diabetes mellitus ([place])     Current Outpatient Medications  Medication Sig Dispense Refill   ACCU-CHEK FASTCLIX LANCETS MISC Use as directed to check blood glucose  3 times daily. E11.9 100 each 5   acetaminophen (TYLENOL) 500 MG tablet Take 1,000 mg by mouth every 6 (six) hours as needed for headache (pain).      Alcohol Swabs (B-D SINGLE USE SWABS REGULAR) PADS USE AS DIRECTED 100 each 0   aspirin EC 81 MG tablet Take 81 mg by mouth daily.     atorvastatin (LIPITOR) 10 MG tablet Take 1 tablet (10 mg total) by mouth daily. 90 tablet 1   Blood Glucose Monitoring Suppl (ACCU-CHEK NANO SMARTVIEW) w/Device KIT Use as directed to check blood glucose 3 times daily. E11.9 1 kit 0   clobetasol cream (TEMOVATE) 0.05 % APPLY TOPICALLY 2 (TWO) TIMES DAILY. 60 g 0   Continuous Blood Gluc Sensor (DEXCOM G6 SENSOR) MISC Use to check blood sugar three times daily. Dx. E11.59 3 each 3   cyclobenzaprine (FLEXERIL) 10 MG tablet Take 1 tablet (10 mg total)  by mouth 2 (two) times daily as needed for muscle spasms. 20 tablet 0   dapagliflozin propanediol (FARXIGA) 5 MG TABS tablet Take 1 tablet (5 mg total) by mouth daily before breakfast. 90 tablet 1   furosemide (LASIX) 20 MG tablet Take 2 tablets (40 mg total) by mouth daily. 60 tablet 2   gabapentin (NEURONTIN) 300 MG capsule Take orally 2 caps (600 mg) in the morning and 3 capsules (900 mg) in the evening 450 capsule 1   glucose blood (ACCU-CHEK SMARTVIEW) test strip Use as instructed to test blood glucose 3 times daily. E11.9 100 each 12   insulin glargine, 2 Unit Dial, (TOUJEO MAX SOLOSTAR) 300 UNIT/ML Solostar Pen Inject 55 Units into the skin daily. 30 mL 6   Insulin Pen Needle (DROPLET PEN NEEDLES) 31G X 8 MM MISC USE WITH TOUJEO SOLOSTAR PEN EVERY DAY 100 each 4   lisinopril (ZESTRIL) 20 MG tablet Take 0.5 tablets (10 mg total) by mouth daily. 30 tablet 6   metFORMIN (GLUCOPHAGE-XR) 500 MG 24 hr tablet Take 2 tablets (1,000 mg total) by mouth 2 (two) times daily. 360 tablet 1   metoprolol tartrate (LOPRESSOR) 50 MG tablet Take 1 tablet (50 mg total) by mouth 2 (two) times daily. 180 tablet 1   Multiple Vitamin (MULTIVITAMIN WITH MINERALS) TABS tablet Take 1 tablet by mouth daily.     nitroGLYCERIN (NITROSTAT) 0.4 MG SL tablet Place 1 tablet (0.4 mg total) under the tongue every 5 (five) minutes as needed for chest pain. 25 tablet 3   pantoprazole (PROTONIX) 40 MG tablet Take 1 tablet (40 mg total) by mouth 2 (two) times daily. 180 tablet 1   No current facility-administered medications for this encounter.    Allergies  Allergen Reactions   Ciprofloxacin Rash      Social History   Socioeconomic History   Marital status: Divorced    Spouse name: Not on file   Number of children: Not on file   Years of education: Not on file   Highest education level: Not on file  Occupational History   Not on file  Tobacco Use   Smoking status: Some Days    Packs/day: 0.50    Years: 42.00     Pack years: 21.00    Types: Cigarettes   Smokeless tobacco: Never  Vaping Use   Vaping Use: Never used  Substance and Sexual Activity   Alcohol use: Yes    Alcohol/week: 1.0 - 2.0 standard drink    Types: 1 - 2 Glasses of wine per week  Comment: 08/07/2014 "glass of wine maybe 2 times/yr"   Drug use: No    Types: Marijuana    Comment: smoked pot years ago but not currently   Sexual activity: Not Currently  Other Topics Concern   Not on file  Social History Narrative   Not on file   Social Determinants of Health   Financial Resource Strain: Not on file  Food Insecurity: Not on file  Transportation Needs: Not on file  Physical Activity: Not on file  Stress: Not on file  Social Connections: Not on file  Intimate Partner Violence: Not on file      Family History  Problem Relation Age of Onset   Heart disease Mother    Diabetes Mother    Cancer Mother        cervical   Diabetes Father    Diabetes Sister    Heart disease Sister    Diabetes Brother    Heart attack Brother    Lupus Sister    Heart failure Sister    Hepatitis C Sister    Heart failure Brother    Diabetes Brother    Hypertension Daughter    Colon cancer Neg Hx     Vitals:   05/19/21 1434  BP: (!) 142/80  Pulse: 77  SpO2: 96%  Weight: 112.8 kg (248 lb 9.6 oz)    PHYSICAL EXAM: General:  Obese woman No respiratory difficulty HEENT: normal Neck: supple. no JVD. Carotids 2+ bilat; no bruits. No lymphadenopathy or thryomegaly appreciated. Cor: PMI nondisplaced. Regular rate & rhythm. No rubs, gallops or murmurs. Prominent P2 Lungs: decreased throughout Abdomen: obese soft, nontender, nondistended. No hepatosplenomegaly. No bruits or masses. Good bowel sounds. Extremities: no cyanosis, clubbing, rash, edema Neuro: alert & oriented x 3, cranial nerves grossly intact. moves all 4 extremities w/o difficulty. Affect pleasant.  ASSESSMENT & PLAN:  1. Pulmonary HTN with RV strain - Echo 12/22 EF  65-70% RV moderately dilated with moderate HK. Flattened septum Moderate TR RVSP est 143mmHG   - Chest CT scan 2/22. No PE - RHC 12/22 - RA 5 PA 79/18 (39) PCW 12  CO 6.4 L/min; CI PVR 4.2 - Suspect mostly WHO Group III but may have component WHO Group I - Hall walk today sats 95% -> 88% - Will place [place] Sleep study - Check CTD serology  - Discussed need for weight loss and smoking cessation - Based on results on PFTs and HST can decide on empiric trial of sildenafil - Refer Pulmonary Rehab  2. CAD - CABG 2005 LIMA-> LAD -cath on 04/16/21 - mLAD 75% LIMA to LAD is widely patent. pRCA 50 - no current angina - Continue ASA/statin - Management per Dr. Marlou Porch  3. Morbid obesity - needs weight loss - consider [id number]  Glori Bickers, MD  2:54 PM

## 2021-05-19 NOTE — Progress Notes (Signed)
Height:     Weight: BMI:  Today's Date:  STOP BANG RISK ASSESSMENT S (snore) Have you been told that you snore?     YES   T (tired) Are you often tired, fatigued, or sleepy during the day?   YES  O (obstruction) Do you stop breathing, choke, or gasp during sleep? YES   P (pressure) Do you have or are you being treated for high blood pressure? YES   B (BMI) Is your body index greater than 35 kg/m? YES   A (age) Are you 65 years old or older? YES   N (neck) Do you have a neck circumference greater than 16 inches?   NO   G (gender) Are you a female? NO   TOTAL STOP/BANG YES ANSWERS 6                                                                       For Office Use Only              Procedure Order Form    YES to 3+ Stop Bang questions OR two clinical symptoms - patient qualifies for WatchPAT (CPT 95800)      Clinical Notes: Will consult Sleep Specialist and refer for management of therapy due to patient increased risk of Sleep Apnea. Ordering a sleep study due to the following two clinical symptoms: Excessive daytime sleepiness G47.10/ Difficulty concentrating R41.840 / Memory problems or poor judgment G31.84 /Loud snoring R06.83  /  History of high blood pressure R03.0 / Insomnia G47.00

## 2021-05-19 NOTE — Patient Instructions (Addendum)
Medication Changes:  No change to medication  Lab Work:  Labs done today, your results will be available in MyChart, we will contact you for abnormal readings.   Testing/Procedures:  Your physician has recommended that you have a pulmonary function test. Pulmonary Function Tests are a group of tests that measure how well air moves in and out of your lungs. They will call you to schedule your appointment.   Your provider has recommended that you have a home sleep study.  We have provided you with the equipment in our office today. Please download the app and follow the instructions. YOUR PIN NUMBER IS: [id number]. Once you have completed the test you just dispose of the equipment, the information is automatically uploaded to [place] via blue-tooth technology. If your test is positive for sleep apnea and you need a home CPAP machine you will be contacted by Dr Theodosia Blender office [facility]) to set this up. ONCE APPROVED BY YOUR INSURANCE WE WILL CALL YOU TO DO THE TEST.   Referrals:  Pulmonary rehab, they will call you to schedule your appointment.  Special Instructions // Education:  Do the following things EVERYDAY: Weigh yourself in the morning before breakfast. Write it down and keep it in a log. Take your medicines as prescribed Eat low salt foods--Limit salt (sodium) to 2000 mg per day.  Stay as active as you can everyday Limit all fluids for the day to less than 2 liters   Follow-Up in: 2 months  At the [facility], you and your health needs are our priority. We have a designated team specialized in the treatment of Heart Failure. This Care Team includes your primary Heart Failure Specialized Cardiologist (physician), Advanced Practice Providers (APPs- Physician Assistants and Nurse Practitioners), and Pharmacist who all work together to provide you with the care you need, when you need it.   You may see any of the following providers on your designated Care Team at  your next follow up:  Dr Glori Bickers Dr Haynes Kerns, NP Lyda Jester, [place] [facility] [place], [place] Audry Riles, PharmD   Please be sure to bring in all your medications bottles to every appointment.   Need to Contact us:  If you have any questions or concerns before your next appointment please send [place] a message through [place] or call our office at [phone].    TO LEAVE A MESSAGE FOR THE NURSE SELECT OPTION 2, PLEASE LEAVE A MESSAGE INCLUDING: YOUR NAME DATE OF BIRTH CALL BACK NUMBER REASON FOR CALL**this is important as we prioritize the call backs  YOU WILL RECEIVE A CALL BACK THE SAME DAY AS LONG AS YOU CALL BEFORE 4:00 PM

## 2021-05-19 NOTE — Progress Notes (Signed)
Pt. Ambulated  around clinic as ordered. Oxygen saturation started @95 %. At end of walk saturation @88 %.

## 2021-05-20 LAB — ANCA TITERS
Atypical P-ANCA titer: <1:20 {titer}
C-ANCA: <1:20 {titer}
P-ANCA: <1:20 {titer}

## 2021-05-20 LAB — CYCLIC CITRUL PEPTIDE ANTIBODY, IGG/IGA: CCP Antibodies IgG/IgA: 3 units (ref 0–19)

## 2021-05-20 LAB — ANTI-SCLERODERMA ANTIBODY: Scleroderma (Scl-70) (ENA) Antibody, IgG: <0.2 AI (ref 0.0–0.9)

## 2021-05-20 LAB — ANA W/REFLEX: Anti Nuclear Antibody (ANA): NEGATIVE

## 2021-05-20 LAB — RHEUMATOID FACTOR: Rheumatoid fact SerPl-aCnc: <10 IU/mL (ref <14.0)

## 2021-05-21 ENCOUNTER — Ambulatory Visit: Payer: No Typology Code available for payment source

## 2021-05-22 ENCOUNTER — Telehealth (HOSPITAL_COMMUNITY): Payer: Self-pay | Admitting: Surgery

## 2021-05-22 NOTE — Telephone Encounter (Signed)
Patient called in reference to ordered home sleep study.  She was informed that insurance company requires that sleep studies be ordered by her PCP.  She sees PCP on the 13th and will speak to them at that time about sleep study.  I will also forward this message to her PCP provider.

## 2021-05-25 ENCOUNTER — Other Ambulatory Visit: Payer: Self-pay | Admitting: Internal Medicine

## 2021-05-26 ENCOUNTER — Ambulatory Visit
Admission: RE | Admit: 2021-05-26 | Discharge: 2021-05-26 | Disposition: A | Discharge status: Home / Self Care | Payer: No Typology Code available for payment source | Source: Ambulatory Visit | Attending: Family Medicine | Admitting: Family Medicine

## 2021-05-26 DIAGNOSIS — Z1231 Encounter for screening mammogram for malignant neoplasm of breast: Secondary | ICD-10-CM

## 2021-05-26 LAB — SARS CORONAVIRUS 2 (TAT 6-24 HRS): SARS Coronavirus 2: NEGATIVE

## 2021-05-27 ENCOUNTER — Telehealth (HOSPITAL_COMMUNITY): Payer: Self-pay

## 2021-05-27 NOTE — Telephone Encounter (Signed)
Pt insurance is active and benefits verified through [place] $10, DED 0/0 met, out of pocket $3,700/$10 met, co-insurance 0%. no pre-authorization required. Passport, JB/Devoted 05/27/2021@2 :39pm, REF# [id number]@2 :47pm  Will contact patient to see if she is interested in the Pulmonary Rehab Program. If interested, patient will need to complete follow up appt. Once completed, patient will be contacted for scheduling upon review by the RN Navigator.

## 2021-05-28 ENCOUNTER — Ambulatory Visit (HOSPITAL_COMMUNITY)
Admission: RE | Admit: 2021-05-28 | Discharge: 2021-05-28 | Disposition: A | Discharge status: Home / Self Care | Payer: No Typology Code available for payment source | Source: Ambulatory Visit | Attending: Internal Medicine | Admitting: Internal Medicine

## 2021-05-28 ENCOUNTER — Other Ambulatory Visit: Payer: Self-pay

## 2021-05-28 DIAGNOSIS — I2729 Other secondary pulmonary hypertension: Secondary | ICD-10-CM | POA: Insufficient documentation

## 2021-05-28 LAB — PULMONARY FUNCTION TEST
DL/VA % pred: 78 %
DL/VA: 3.24 ml/min/mmHg/L
DLCO unc % pred: 43 %
DLCO unc: 9.06 ml/min/mmHg
FEF 25-75 Post: 1.51 L/sec
FEF 25-75 Pre: 1.14 L/sec
FEF2575-%Change-Post: 32 %
FEF2575-%Pred-Post: 75 %
FEF2575-%Pred-Pre: 57 %
FEV1-%Change-Post: 9 %
FEV1-%Pred-Post: 66 %
FEV1-%Pred-Pre: 61 %
FEV1-Post: 1.39 L
FEV1-Pre: 1.27 L
FEV1FVC-%Change-Post: 3 %
FEV1FVC-%Pred-Pre: 99 %
FEV6-%Change-Post: 4 %
FEV6-%Pred-Post: 66 %
FEV6-%Pred-Pre: 63 %
FEV6-Post: 1.7 L
FEV6-Pre: 1.63 L
FEV6FVC-%Change-Post: 0 %
FEV6FVC-%Pred-Post: 103 %
FEV6FVC-%Pred-Pre: 103 %
FVC-%Change-Post: 5 %
FVC-%Pred-Post: 64 %
FVC-%Pred-Pre: 61 %
FVC-Post: 1.71 L
FVC-Pre: 1.63 L
Post FEV1/FVC ratio: 81 %
Post FEV6/FVC ratio: 99 %
Pre FEV1/FVC ratio: 78 %
Pre FEV6/FVC Ratio: 100 %
RV % pred: 77 %
RV: 1.63 L
TLC % pred: 63 %
TLC: 3.33 L

## 2021-05-28 MED ORDER — ALBUTEROL SULFATE (2.5 MG/3ML) 0.083% IN NEBU
2.5000 mg | INHALATION_SOLUTION | Freq: Once | RESPIRATORY_TRACT | Status: AC
  Administered 2021-05-28: 2.5 mg via RESPIRATORY_TRACT

## 2021-05-29 ENCOUNTER — Encounter (HOSPITAL_COMMUNITY): Payer: Self-pay | Admitting: *Deleted

## 2021-05-29 NOTE — Progress Notes (Signed)
Received referral from Dr. Haroldine Laws for this pt to participate in pulmonary rehab with the diagnosis of Pulmonary Hypertension. Clinical review of pt follow up appt on 05/19/21 Pulmonary office note.  Also reviewed progress notes with cardiology - Dr. Marlou Porch and PCP Dr. Margarita Rana. Pt with Covid Risk Score - 5. Pt appropriate for scheduling for Pulmonary rehab.  Will forward to support staff for scheduling when able as there is a wait list. Maurice Small RN, BSN Cardiac and Pulmonary Rehab Nurse Navigator

## 2021-06-01 ENCOUNTER — Ambulatory Visit: Payer: No Typology Code available for payment source | Attending: Family Medicine | Admitting: Family Medicine

## 2021-06-01 ENCOUNTER — Other Ambulatory Visit: Payer: Self-pay

## 2021-06-01 ENCOUNTER — Other Ambulatory Visit (HOSPITAL_COMMUNITY)
Admission: RE | Admit: 2021-06-01 | Discharge: 2021-06-01 | Disposition: A | Discharge status: Home / Self Care | Payer: No Typology Code available for payment source | Source: Ambulatory Visit | Attending: Family Medicine | Admitting: Family Medicine

## 2021-06-01 ENCOUNTER — Telehealth: Payer: Self-pay | Admitting: Family Medicine

## 2021-06-01 ENCOUNTER — Encounter: Payer: Self-pay | Admitting: Family Medicine

## 2021-06-01 VITALS — BP 127/80 | HR 84 | Ht 65.0 in | Wt 242.8 lb

## 2021-06-01 DIAGNOSIS — R29818 Other symptoms and signs involving the nervous system: Secondary | ICD-10-CM

## 2021-06-01 DIAGNOSIS — Z0000 Encounter for general adult medical examination without abnormal findings: Secondary | ICD-10-CM

## 2021-06-01 DIAGNOSIS — B9689 Other specified bacterial agents as the cause of diseases classified elsewhere: Secondary | ICD-10-CM

## 2021-06-01 DIAGNOSIS — B3731 Acute candidiasis of vulva and vagina: Secondary | ICD-10-CM

## 2021-06-01 DIAGNOSIS — Z1151 Encounter for screening for human papillomavirus (HPV): Secondary | ICD-10-CM | POA: Diagnosis not present

## 2021-06-01 DIAGNOSIS — Z124 Encounter for screening for malignant neoplasm of cervix: Secondary | ICD-10-CM | POA: Diagnosis not present

## 2021-06-01 DIAGNOSIS — F17210 Nicotine dependence, cigarettes, uncomplicated: Secondary | ICD-10-CM | POA: Diagnosis not present

## 2021-06-01 DIAGNOSIS — N760 Acute vaginitis: Secondary | ICD-10-CM

## 2021-06-01 DIAGNOSIS — Z0001 Encounter for general adult medical examination with abnormal findings: Secondary | ICD-10-CM | POA: Diagnosis not present

## 2021-06-01 DIAGNOSIS — Z01419 Encounter for gynecological examination (general) (routine) without abnormal findings: Secondary | ICD-10-CM | POA: Insufficient documentation

## 2021-06-01 MED ORDER — ZOSTER VAC RECOMB ADJUVANTED 50 MCG/0.5ML IM SUSR: 0.5000 mL | Freq: Once | INTRAMUSCULAR | 1 refills | Status: AC

## 2021-06-01 MED ORDER — NICOTINE 14 MG/24HR TD PT24
14.0000 mg | MEDICATED_PATCH | Freq: Every day | TRANSDERMAL | 2 refills | Status: DC
  Filled 2021-06-01: qty 28, 28d supply, fill #0

## 2021-06-01 MED ORDER — METRONIDAZOLE 0.75 % VA GEL
1.0000 | Freq: Every day | VAGINAL | 0 refills | Status: DC
  Filled 2021-06-01: qty 70, 7d supply, fill #0

## 2021-06-01 MED ORDER — FLUCONAZOLE 150 MG PO TABS
150.0000 mg | ORAL_TABLET | Freq: Once | ORAL | 0 refills | Status: AC
  Filled 2021-06-01: qty 1, 1d supply, fill #0

## 2021-06-01 NOTE — Patient Instructions (Signed)
°  Ms. Deborah Coffey , Thank you for taking time to come for your Medicare Wellness Visit. I appreciate your ongoing commitment to your health goals. Please review the following plan we discussed and let me know if I can assist you in the future.   These are the goals we discussed:  Goals   None     This is a list of the screening recommended for you and due dates:  Health Maintenance  Topic Date Due   COVID-19 Vaccine (1) Never done   Pap Smear  12/05/2015   Zoster (Shingles) Vaccine (2 of 2) 08/08/2019   Eye exam for diabetics  01/17/2020   Tetanus Vaccine  04/27/2022*   Hemoglobin A1C  10/25/2021   Complete foot exam   04/27/2022   Mammogram  05/27/2023   Colon Cancer Screening  11/25/2026   Flu Shot  Completed   Hepatitis C Screening: USPSTF Recommendation to screen - Ages 18-79 yo.  Completed   HIV Screening  Completed   HPV Vaccine  Aged Out  *Topic was postponed. The date shown is not the original due date.

## 2021-06-01 NOTE — Telephone Encounter (Signed)
Before I call [facility], I just wanted to check and see if this was an issue of a PA or not.

## 2021-06-01 NOTE — Progress Notes (Signed)
Subjective:   Deborah Coffey is a 66 y.o. female with a history of Type 2 DM (A1c 9.1), Diabetic neuropathy, Hypertension, CAD (s/p CABG), history of COVID who presents for Medicare Annual (Subsequent) preventive examination. She complains of a fishy odor in her vagina Her cardiologist would like her to have a sleep study to exclude sleep apnea due to presence of pulmonary hypertension.  Review of Systems    General: negative for fever, weight loss, appetite change Eyes: no visual symptoms. ENT: no ear symptoms, no sinus tenderness, no nasal congestion or sore throat. Neck: no pain  Respiratory: no wheezing, shortness of breath, cough Cardiovascular: no chest pain, no dyspnea on exertion, no pedal edema, no orthopnea. Gastrointestinal: no abdominal pain, no diarrhea, no constipation Genito-Urinary: no urinary frequency, no dysuria, no polyuria, +vaginal odor Hematologic: no bruising Endocrine: no cold or heat intolerance Neurological: no headaches, no seizures, no tremors Musculoskeletal: no joint pains, no joint swelling Skin: no pruritus, no rash. Psychological: no depression, no anxiety,         Objective:    Today's Vitals   06/01/21 1509  BP: 127/80  Pulse: 84  SpO2: 97%  Weight: 242 lb 12.8 oz (110.1 kg)  Height: $Remove'5\' 5"'wKysZdF$  (1.651 m)   Body mass index is 40.4 kg/m.  Advanced Directives 06/01/2021 04/16/2021 10/04/2019 09/19/2019 11/03/2016 09/03/2016 01/29/2016  Does Patient Have a Medical Advance Directive? No No No No No No No  Would patient like information on creating a medical advance directive? - No - Patient declined - No - Patient declined - No - Patient declined No - patient declined information    Current Medications (verified) Outpatient Encounter Medications as of 06/01/2021  Medication Sig   ACCU-CHEK FASTCLIX LANCETS MISC Use as directed to check blood glucose 3 times daily. E11.9   acetaminophen (TYLENOL) 500 MG tablet Take 1,000 mg by mouth every 6  (six) hours as needed for headache (pain).    Alcohol Swabs (B-D SINGLE USE SWABS REGULAR) PADS USE AS DIRECTED   aspirin EC 81 MG tablet Take 81 mg by mouth daily.   atorvastatin (LIPITOR) 10 MG tablet Take 1 tablet (10 mg total) by mouth daily.   Blood Glucose Monitoring Suppl (ACCU-CHEK NANO SMARTVIEW) w/Device KIT Use as directed to check blood glucose 3 times daily. E11.9   clobetasol cream (TEMOVATE) 0.05 % APPLY TOPICALLY 2 (TWO) TIMES DAILY.   Continuous Blood Gluc Sensor (DEXCOM G6 SENSOR) MISC Use to check blood sugar three times daily. Dx. E11.59   cyclobenzaprine (FLEXERIL) 10 MG tablet Take 1 tablet (10 mg total) by mouth 2 (two) times daily as needed for muscle spasms.   dapagliflozin propanediol (FARXIGA) 5 MG TABS tablet Take 1 tablet (5 mg total) by mouth daily before breakfast.   furosemide (LASIX) 20 MG tablet Take 2 tablets (40 mg total) by mouth daily.   gabapentin (NEURONTIN) 300 MG capsule Take orally 2 caps (600 mg) in the morning and 3 capsules (900 mg) in the evening   glucose blood (ACCU-CHEK SMARTVIEW) test strip Use as instructed to test blood glucose 3 times daily. E11.9   insulin glargine, 2 Unit Dial, (TOUJEO MAX SOLOSTAR) 300 UNIT/ML Solostar Pen Inject 55 Units into the skin daily.   Insulin Pen Needle (DROPLET PEN NEEDLES) 31G X 8 MM MISC USE WITH TOUJEO SOLOSTAR PEN EVERY DAY   lisinopril (ZESTRIL) 20 MG tablet Take 0.5 tablets (10 mg total) by mouth daily.   metFORMIN (GLUCOPHAGE-XR) 500 MG 24 hr tablet Take  2 tablets (1,000 mg total) by mouth 2 (two) times daily.   metoprolol tartrate (LOPRESSOR) 50 MG tablet Take 1 tablet (50 mg total) by mouth 2 (two) times daily.   Multiple Vitamin (MULTIVITAMIN WITH MINERALS) TABS tablet Take 1 tablet by mouth daily.   nitroGLYCERIN (NITROSTAT) 0.4 MG SL tablet Place 1 tablet (0.4 mg total) under the tongue every 5 (five) minutes as needed for chest pain.   pantoprazole (PROTONIX) 40 MG tablet Take 1 tablet (40 mg total)  by mouth 2 (two) times daily.   No facility-administered encounter medications on file as of 06/01/2021.    Allergies (verified) Ciprofloxacin   History: Past Medical History:  Diagnosis Date   Allergy    Anemia    CAD in native artery 02/18/2004   Mid LAD lesoin just after Large D1 --> CABG x 1 LIMA-LAD   CAP (community acquired pneumonia) 08/07/2014   Chronic kidney disease, stage 3b ([place])    Depression    High cholesterol    History of blood transfusion 04/20/2003   "related to OHS"    Hypertension    Lung nodules    Mild carotid artery disease ([place])    Pulmonary hypertension ([place])    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 competitive flow   TIA (transient ischemic attack)    Type II diabetes mellitus ([place])    Past Surgical History:  Procedure Laterality Date   CARDIAC CATHETERIZATION  2010   Previous LAD 95% lesion - now ~30-40%; patent LIMA with competitive flow   CESAREAN SECTION  1977; 1989   CHOLECYSTECTOMY  ~ 2012   CORONARY ARTERY BYPASS GRAFT  02/2004   "CABG X1" (03/22/2013); LIMA-LAD   RIGHT/LEFT HEART CATH AND CORONARY/GRAFT ANGIOGRAPHY N/A 04/16/2021   Procedure: RIGHT/LEFT HEART CATH AND CORONARY/GRAFT ANGIOGRAPHY;  Surgeon: Jettie Booze, MD;  Location: [facility];  Service: Cardiovascular;  Laterality: N/A;   TUBAL LIGATION  1989   Family History  Problem Relation Age of Onset   Heart disease Mother    Diabetes Mother    Cancer Mother        cervical   Diabetes Father    Diabetes Sister    Heart disease Sister    Diabetes Brother    Heart attack Brother    Lupus Sister    Heart failure Sister    Hepatitis C Sister    Heart failure Brother    Diabetes Brother    Hypertension Daughter    Colon cancer Neg Hx    Social History   Socioeconomic History   Marital status: Divorced    Spouse name: Not on file   Number of children: Not on file   Years of education: Not on file    Highest education level: Not on file  Occupational History   Not on file  Tobacco Use   Smoking status: Some Days    Packs/day: 0.50    Years: 42.00    Pack years: 21.00    Types: Cigarettes   Smokeless tobacco: Never  Vaping Use   Vaping Use: Never used  Substance and Sexual Activity   Alcohol use: Yes    Alcohol/week: 1.0 - 2.0 standard drink    Types: 1 - 2 Glasses of wine per week    Comment: 08/07/2014 "glass of wine maybe 2 times/yr"   Drug use: No    Types: Marijuana    Comment: smoked pot years  ago but not currently   Sexual activity: Not Currently  Other Topics Concern   Not on file  Social History Narrative   Not on file   Social Determinants of Health   Financial Resource Strain: Not on file  Food Insecurity: Not on file  Transportation Needs: Not on file  Physical Activity: Not on file  Stress: Not on file  Social Connections: Not on file    Tobacco Counseling Ready to quit: Not Answered Counseling given: Not Answered   Clinical Intake:  Pre-visit preparation completed: No  Pain : No/denies pain     Diabetes: Yes CBG done?: No     Diabetic?Yes  Interpreter Needed?: No      Activities of Daily Living In your present state of health, do you have any difficulty performing the following activities: 06/01/2021  Hearing? N  Vision? Y  Difficulty concentrating or making decisions? N  Walking or climbing stairs? N  Dressing or bathing? N  Doing errands, shopping? N  Preparing Food and eating ? N  Using the Toilet? N  In the past six months, have you accidently leaked urine? N  Do you have problems with loss of bowel control? N  Managing your Medications? N  Managing your Finances? N  Housekeeping or managing your Housekeeping? N  Some recent data might be hidden    Patient Care Team: Charlott Rakes, MD as PCP - General (Family Medicine) Jerline Pain, MD as PCP - Cardiology (Cardiology) Gala Romney Cristopher Estimable, MD as Consulting Physician  (Gastroenterology)  Indicate any recent Medical Services you may have received from other than [facility] providers in the past year (date may be approximate).  Physical exam:   General: Awake, alert, oriented x3 Heart sounds-S1, S2 regular rate and rhythm Lungs-clear to auscultation bilaterally Abdomen-soft, nontender Genitourinary-external genitalia normal.  Whitish cheesy discharge in vagina, cervix, adnexa-normal, no CMT Assessment:   This is a routine wellness examination for Deborah Coffey.  Hearing/Vision screen No results found.  Dietary issues and exercise activities discussed:     Goals Addressed   None    Depression Screen PHQ 2/9 Scores 06/01/2021 04/27/2021 01/21/2021 07/22/2020 10/04/2019 02/09/2019 01/31/2018  PHQ - 2 Score 0 0 $R'2 4 1 'NK$ 0 2  PHQ- 9 Score - 0 $R'6 10 5 'Yo$ 0 4    Fall Risk Fall Risk  06/01/2021 04/27/2021 01/21/2021 07/22/2020 10/04/2019  Falls in the past year? 0 0 0 0 0  Number falls in past yr: 0 0 0 0 -  Injury with Fall? 0 0 0 0 -  Risk for fall due to : - - No Fall Risks - No Fall Risks  Follow up - - - - -    [place]:  Any stairs in or around the home? No  If so, are there any without handrails? No  Home free of loose throw rugs in walkways, pet beds, electrical cords, etc? Yes  Adequate lighting in your home to reduce risk of falls? Yes   ASSISTIVE DEVICES UTILIZED TO PREVENT FALLS:  Life alert? Yes  Use of a cane, walker or w/c? No  Grab bars in the bathroom? Yes  Shower chair or bench in shower? No  Elevated toilet seat or a handicapped toilet? No   TIMED UP AND GO:  Was the test performed? No .  Length of time to ambulate 10 feet: n/a sec.   Gait slow and steady without use of assistive device  Cognitive Function: MMSE -  Mini Mental State Exam 06/01/2021  Orientation to time 5  Orientation to Place 5  Registration 3  Attention/ Calculation 5  Recall 3  Language- name 2 objects 2  Language- repeat 1   Language- follow 3 step command 3  Language- read & follow direction 1  Write a sentence 1  Copy design 1  Total score 30        Immunizations Immunization History  Administered Date(s) Administered   Influenza,inj,Quad PF,6+ Mos 03/28/2015, 02/18/2019, 04/27/2021   PNEUMOCOCCAL CONJUGATE-20 04/27/2021   Pneumococcal Polysaccharide-23 01/31/2018, 06/23/2018   Zoster Recombinat (Shingrix) 06/13/2019    Screening Tests Health Maintenance  Topic Date Due   COVID-19 Vaccine (1) Never done   PAP SMEAR-Modifier  12/05/2015   Zoster Vaccines- Shingrix (2 of 2) 08/08/2019   OPHTHALMOLOGY EXAM  01/17/2020   TETANUS/TDAP  04/27/2022 (Originally 04/02/1975)   HEMOGLOBIN A1C  10/25/2021   FOOT EXAM  04/27/2022   MAMMOGRAM  05/27/2023   COLONOSCOPY (Pts 45-5yrs Insurance coverage will need to be confirmed)  11/25/2026   INFLUENZA VACCINE  Completed   Hepatitis C Screening  Completed   HIV Screening  Completed   HPV VACCINES  Aged Out    Health Maintenance  Health Maintenance Due  Topic Date Due   COVID-19 Vaccine (1) Never done   PAP SMEAR-Modifier  12/05/2015   Zoster Vaccines- Shingrix (2 of 2) 08/08/2019   OPHTHALMOLOGY EXAM  01/17/2020    Colorectal cancer screening: Type of screening: Colonoscopy. Completed 11/2016. Repeat every 10 years  Mammogram status: Completed 05/2021. Repeat every year   Lung Cancer Screening: (Low Dose CT Chest recommended if Age 26-80 years, 30 pack-year currently smoking OR have quit w/in 15years.) does qualify.   Lung Cancer Screening Referral: placed  Additional Screening:  Hepatitis C Screening: does qualify; Completed 01/2018  Vision Screening: Recommended annual ophthalmology exams for early detection of glaucoma and other disorders of the eye. Is the patient up to date with their annual eye exam?  No  Who is the provider or what is the name of the office in which the patient attends annual eye exams? Dr Katy Fitch If pt is not  established with a provider, would they like to be referred to a provider to establish care? No .   Dental Screening: Recommended annual dental exams for proper oral hygiene  Community Resource Referral / Chronic Care Management: CRR required this visit?  No   CCM required this visit?  No      Plan:    1. Encounter for Medicare annual wellness exam Counseled on 150 minutes of exercise per week, healthy eating (including decreased daily intake of saturated fats, cholesterol, added sugars, sodium), routine healthcare maintenance. She is in the process of addressing her advanced directives as her insurance company will be sending it to her in the mail  2. Suspected sleep apnea She does have pulmonary hypertension Sleep study requested by cardiology - Split night study; Future  3. Smoking greater than 20 pack years Spent 3 minutes counseling on smoking cessation and has adverse effect of smoking.  She is willing to work on quitting - [place]; Future - nicotine (NICODERM CQ) 14 mg/24hr patch; Place 1 patch (14 mg total) onto the skin daily. For 1 month then decrease to 7 mg/day  Dispense: 28 patch; Refill: 2  4. Screening for cervical cancer - Cytology - PAP  5. Bacterial vaginosis - metroNIDAZOLE (METROGEL VAGINAL) 0.75 % vaginal gel;  Place 1 Applicatorful vaginally at bedtime.  Dispense: 70 g; Refill: 0  6. Vaginal candidiasis - fluconazole (DIFLUCAN) 150 MG tablet; Take 1 tablet (150 mg total) by mouth once for 1 dose.  Dispense: 1 tablet; Refill: 0  I have personally reviewed and noted the following in the patients chart:   Medical and social history Use of alcohol, tobacco or illicit drugs  Current medications and supplements including opioid prescriptions.  Functional ability and status Nutritional status Physical activity Advanced directives List of other physicians Hospitalizations, surgeries, and ER visits in previous 12  months Vitals Screenings to include cognitive, depression, and falls Referrals and appointments  In addition, I have reviewed and discussed with patient certain preventive protocols, quality metrics, and best practice recommendations. A written personalized care plan for preventive services as well as general preventive health recommendations were provided to patient.     Charlott Rakes, MD   06/01/2021

## 2021-06-01 NOTE — Telephone Encounter (Signed)
This patient informed me that there is a hold on her CGM and her doctor needed to call them.  Can you please follow-up on this?  Thank you

## 2021-06-01 NOTE — Progress Notes (Signed)
Medicare wellness

## 2021-06-02 ENCOUNTER — Other Ambulatory Visit: Payer: Self-pay

## 2021-06-02 LAB — CYTOLOGY - PAP
Adequacy: ABSENT
Comment: NEGATIVE
Diagnosis: NEGATIVE
High risk HPV: NEGATIVE

## 2021-06-04 ENCOUNTER — Telehealth: Payer: Self-pay

## 2021-06-04 NOTE — Telephone Encounter (Signed)
Patient name and DOB has been verified Patient was informed of lab results. Patient had no questions.  

## 2021-06-04 NOTE — Telephone Encounter (Signed)
-----   Message from Charlott Rakes, MD sent at 06/02/2021  5:47 PM EST ----- Please inform her that her PAP smear is normal

## 2021-06-11 ENCOUNTER — Other Ambulatory Visit: Payer: Self-pay

## 2021-06-11 ENCOUNTER — Ambulatory Visit: Payer: Self-pay

## 2021-06-11 ENCOUNTER — Other Ambulatory Visit: Payer: Self-pay | Admitting: Family Medicine

## 2021-06-11 DIAGNOSIS — L409 Psoriasis, unspecified: Secondary | ICD-10-CM

## 2021-06-11 DIAGNOSIS — E785 Hyperlipidemia, unspecified: Secondary | ICD-10-CM

## 2021-06-11 DIAGNOSIS — I10 Essential (primary) hypertension: Secondary | ICD-10-CM

## 2021-06-11 DIAGNOSIS — E1169 Type 2 diabetes mellitus with other specified complication: Secondary | ICD-10-CM

## 2021-06-11 MED ORDER — ATORVASTATIN CALCIUM 10 MG PO TABS
10.0000 mg | ORAL_TABLET | Freq: Every day | ORAL | 0 refills | Status: DC
  Filled 2021-06-11: qty 90, 90d supply, fill #0

## 2021-06-11 MED ORDER — PANTOPRAZOLE SODIUM 40 MG PO TBEC
40.0000 mg | DELAYED_RELEASE_TABLET | Freq: Two times a day (BID) | ORAL | 0 refills | Status: DC
  Filled 2021-06-11: qty 180, 90d supply, fill #0

## 2021-06-11 MED ORDER — CLOBETASOL PROPIONATE 0.05 % EX CREA
TOPICAL_CREAM | Freq: Two times a day (BID) | CUTANEOUS | 0 refills | Status: DC
  Filled 2021-06-11: qty 60, 30d supply, fill #0

## 2021-06-11 NOTE — Telephone Encounter (Signed)
Cream has been sent to pharmacy.

## 2021-06-11 NOTE — Telephone Encounter (Signed)
Requested Prescriptions  Pending Prescriptions Disp Refills   pantoprazole (PROTONIX) 40 MG tablet 180 tablet 1    Sig: Take 1 tablet (40 mg total) by mouth 2 (two) times daily.     Gastroenterology: Proton Pump Inhibitors Passed - 06/11/2021 12:01 PM      Passed - Valid encounter within last 12 months    Recent Outpatient Visits          1 week ago Encounter for [employer] annual wellness exam   [place], [place], MD   1 month ago Type 2 diabetes mellitus with other circulatory complication, with long-term current use of insulin ([place])   [place], Charlane Ferretti, MD   4 months ago Type 2 diabetes mellitus with other circulatory complication, with long-term current use of insulin [facility])   [place], [place], [place]   10 months ago Type 2 diabetes mellitus with other circulatory complication, with long-term current use of insulin ([place])   [place], Charlane Ferretti, MD   1 year ago Type 2 diabetes mellitus with other specified complication, with long-term current use of insulin ([place])   [place], Charlane Ferretti, MD      Future Appointments            In 2 months Charlott Rakes, MD [place]            clobetasol cream (TEMOVATE) 0.05 % 60 g 0    Sig: Apply topically 2 (two) times daily.     Not Delegated - Dermatology:  Corticosteroids Failed - 06/11/2021 12:01 PM      Failed - This refill cannot be delegated      Passed - Valid encounter within last 12 months    Recent Outpatient Visits          1 week ago Encounter for [employer] annual wellness exam   [place], Leonard, MD   1 month ago Type 2 diabetes mellitus with other circulatory complication, with long-term current use of insulin ([place])   [place], Powers, MD   4 months ago Type 2 diabetes mellitus with other circulatory complication, with long-term current use of insulin [facility])   [place], [place], [place]   10 months ago Type 2 diabetes mellitus with other circulatory complication, with long-term current use of insulin ([place])   [place], [place], MD   1 year ago Type 2 diabetes mellitus with other specified complication, with long-term current use of insulin ([place])   [place], Charlane Ferretti, MD      Future Appointments            In 2 months Charlott Rakes, MD [place]            atorvastatin (LIPITOR) 10 MG tablet 90 tablet 1    Sig: Take 1 tablet (10 mg total) by mouth daily.     Cardiovascular:  Antilipid - Statins Failed - 06/11/2021 12:01 PM      Failed - Lipid Panel in normal range within the last 12 months    Cholesterol, Total  Date Value Ref Range Status  01/21/2021 161 100 - 199 mg/dL Final   LDL Chol Calc (NIH)  Date Value  Ref Range Status  01/21/2021 70 0 - 99 mg/dL Final   HDL  Date Value Ref Range Status  01/21/2021 72 >39 mg/dL Final   Triglycerides  Date Value Ref Range Status  01/21/2021 104 0 - 149 mg/dL Final         Passed - Patient is not pregnant      Passed - Valid encounter within last 12 months    Recent Outpatient Visits          1 week ago Encounter for [employer] annual wellness exam   [place], Royal, MD   1 month ago Type 2 diabetes mellitus with other circulatory complication, with long-term current use of insulin ([place])   [place], [place], MD   4 months ago Type 2 diabetes mellitus with other circulatory complication, with long-term current use of insulin [facility])   [place], [place], [place]   10 months ago  Type 2 diabetes mellitus with other circulatory complication, with long-term current use of insulin ([place])   [place], [place], MD   1 year ago Type 2 diabetes mellitus with other specified complication, with long-term current use of insulin ([place])   [place], [place]obong, MD      Future Appointments            In 2 months Charlott Rakes, MD [place]

## 2021-06-11 NOTE — Telephone Encounter (Signed)
Pt is requesting refills on atorvastatin, temovate cream, and voltaren gel. The voltaren gel isn't on pt's medication list so advised her I will send to provider for refill request.   Summary: RX request but not sure of name   Pt called for RX request for a muscle pain ointment and a cream for rash but doesn't know the names/ please advise     Reason for Disposition  [1] Prescription refill request for NON-ESSENTIAL medicine (i.e., no harm to patient if med not taken) AND [2] triager unable to refill per department policy  Answer Assessment - Initial Assessment Questions 1. DRUG NAME: "What medicine do you need to have refilled?"     Atorvastin, temovate cream, and voltaren gel 2. REFILLS REMAINING: "How many refills are remaining?" (Note: The label on the medicine or pill bottle will show how many refills are remaining. If there are no refills remaining, then a renewal may be needed.)     0 4. PRESCRIBING HCP: "Who prescribed it?" Reason: If prescribed by specialist, call should be referred to that group.     Dr. Margarita Rana  Protocols used: Medication Refill and Renewal Call-A-AH

## 2021-06-11 NOTE — Telephone Encounter (Signed)
Medication Refill - Medication: pantoprazole (PROTONIX) 40 MG tablet  metFORMIN (GLUCOPHAGE-XR) 500 MG 24 hr tablet  Pt stated she also needs her muscle pain ointment and a cream for her rash/ she doesn't know the name and these meds were prescribed by former provider so pharmacy advised her to call   Has the patient contacted their pharmacy? Yes.   (Agent: If no, request that the patient contact the pharmacy for the refill. If patient does not wish to contact the pharmacy document the reasn why and proceed with request.) (Agent: If yes, when and what did the pharmacy advise?)  Preferred Pharmacy (with phone number or street name): [facility] [address] [place], [place] [address]  Phone:  [phone]  Fax:  [phone] Has the patient been seen for an appointment in the last year OR does the patient have an upcoming appointment? Yes.    Agent: Please be advised that RX refills may take up to 3 business days. We ask that you follow-up with your pharmacy.

## 2021-06-11 NOTE — Telephone Encounter (Signed)
Requested medication (s) are due for refill today: yes  Requested medication (s) are on the active medication list: yes  Last refill:  02/11/20  Future visit scheduled: yes  Notes to clinic:  Unable to refill per protocol, cannot delegate.      Requested Prescriptions  Pending Prescriptions Disp Refills   clobetasol cream (TEMOVATE) 0.05 % 60 g 0    Sig: Apply topically 2 (two) times daily.     Not Delegated - Dermatology:  Corticosteroids Failed - 06/11/2021 12:01 PM      Failed - This refill cannot be delegated      Passed - Valid encounter within last 12 months    Recent Outpatient Visits           1 week ago Encounter for [employer] annual wellness exam   [place], [place], MD   1 month ago Type 2 diabetes mellitus with other circulatory complication, with long-term current use of insulin ([place])   [place], [place], MD   4 months ago Type 2 diabetes mellitus with other circulatory complication, with long-term current use of insulin [facility])   [place], [place], [place]   10 months ago Type 2 diabetes mellitus with other circulatory complication, with long-term current use of insulin ([place])   [place], [place], MD   1 year ago Type 2 diabetes mellitus with other specified complication, with long-term current use of insulin ([place])   [place], Charlane Ferretti, MD       Future Appointments             In 2 months Charlott Rakes, MD [place]            Signed Prescriptions Disp Refills   pantoprazole (PROTONIX) 40 MG tablet 180 tablet 0    Sig: Take 1 tablet (40 mg total) by mouth 2 (two) times daily.     Gastroenterology: Proton Pump Inhibitors Passed - 06/11/2021 12:01 PM      Passed - Valid encounter within last 12 months    Recent Outpatient  Visits           1 week ago Encounter for [employer] annual wellness exam   [place], [place], MD   1 month ago Type 2 diabetes mellitus with other circulatory complication, with long-term current use of insulin ([place])   [place], Charlane Ferretti, MD   4 months ago Type 2 diabetes mellitus with other circulatory complication, with long-term current use of insulin [facility])   [place], [place], [place]   10 months ago Type 2 diabetes mellitus with other circulatory complication, with long-term current use of insulin ([place])   [place], Charlane Ferretti, MD   1 year ago Type 2 diabetes mellitus with other specified complication, with long-term current use of insulin ([place])   [place], Charlane Ferretti, MD       Future Appointments             In 2 months Charlott Rakes, MD [place]             atorvastatin (LIPITOR) 10 MG tablet 90 tablet 0    Sig: Take 1 tablet (10 mg total) by mouth  daily.     Cardiovascular:  Antilipid - Statins Failed - 06/11/2021 12:01 PM      Failed - Lipid Panel in normal range within the last 12 months    Cholesterol, Total  Date Value Ref Range Status  01/21/2021 161 100 - 199 mg/dL Final   LDL Chol Calc (NIH)  Date Value Ref Range Status  01/21/2021 70 0 - 99 mg/dL Final   HDL  Date Value Ref Range Status  01/21/2021 72 >39 mg/dL Final   Triglycerides  Date Value Ref Range Status  01/21/2021 104 0 - 149 mg/dL Final         Passed - Patient is not pregnant      Passed - Valid encounter within last 12 months    Recent Outpatient Visits           1 week ago Encounter for [employer] annual wellness exam   [place], [place], MD   1 month ago Type 2 diabetes mellitus with other circulatory complication, with  long-term current use of insulin ([place])   [employer], [place], MD   4 months ago Type 2 diabetes mellitus with other circulatory complication, with long-term current use of insulin [facility])   [place], [place], [place]   10 months ago Type 2 diabetes mellitus with other circulatory complication, with long-term current use of insulin ([place])   [place], [place], MD   1 year ago Type 2 diabetes mellitus with other specified complication, with long-term current use of insulin ([place])   [place], MD       Future Appointments             In 2 months Charlott Rakes, MD [place]

## 2021-06-12 ENCOUNTER — Other Ambulatory Visit: Payer: Self-pay

## 2021-06-15 ENCOUNTER — Ambulatory Visit (HOSPITAL_COMMUNITY): Payer: No Typology Code available for payment source

## 2021-06-15 ENCOUNTER — Other Ambulatory Visit: Payer: Self-pay

## 2021-06-18 ENCOUNTER — Other Ambulatory Visit: Payer: Self-pay

## 2021-06-22 ENCOUNTER — Other Ambulatory Visit: Payer: Self-pay

## 2021-06-23 ENCOUNTER — Telehealth: Payer: Self-pay | Admitting: Family Medicine

## 2021-06-23 ENCOUNTER — Other Ambulatory Visit: Payer: Self-pay

## 2021-06-23 DIAGNOSIS — K635 Polyp of colon: Secondary | ICD-10-CM

## 2021-06-23 NOTE — Telephone Encounter (Signed)
Referral Request - Has patient seen PCP for this complaint? yes ?*If NO, is insurance requiring patient see PCP for this issue before PCP can refer them? ?Referral for which specialty:colonoscopy ?Preferred provider/office:N/A ?Reason for referral: regular colonoscopy ?

## 2021-06-23 NOTE — Telephone Encounter (Signed)
Referral has been placed. 

## 2021-06-24 NOTE — Telephone Encounter (Signed)
Patient aware.

## 2021-06-26 ENCOUNTER — Telehealth (HOSPITAL_COMMUNITY): Payer: Self-pay | Admitting: Vascular Surgery

## 2021-06-26 NOTE — Telephone Encounter (Signed)
LVM to change 3/23 appt @ 10:40 TO another day DB will be out of office ?

## 2021-07-03 ENCOUNTER — Other Ambulatory Visit: Payer: Self-pay

## 2021-07-03 ENCOUNTER — Ambulatory Visit (HOSPITAL_COMMUNITY)
Admission: RE | Admit: 2021-07-03 | Discharge: 2021-07-03 | Disposition: A | Discharge status: Home / Self Care | Payer: No Typology Code available for payment source | Source: Ambulatory Visit | Attending: Internal Medicine | Admitting: Internal Medicine

## 2021-07-03 ENCOUNTER — Encounter (HOSPITAL_COMMUNITY): Payer: Self-pay | Admitting: Internal Medicine

## 2021-07-03 DIAGNOSIS — I272 Pulmonary hypertension, unspecified: Secondary | ICD-10-CM

## 2021-07-03 DIAGNOSIS — E785 Hyperlipidemia, unspecified: Secondary | ICD-10-CM | POA: Insufficient documentation

## 2021-07-03 DIAGNOSIS — I130 Hypertensive heart and chronic kidney disease with heart failure and stage 1 through stage 4 chronic kidney disease, or unspecified chronic kidney disease: Secondary | ICD-10-CM | POA: Insufficient documentation

## 2021-07-03 DIAGNOSIS — Z720 Tobacco use: Secondary | ICD-10-CM | POA: Diagnosis not present

## 2021-07-03 DIAGNOSIS — Z8673 Personal history of transient ischemic attack (TIA), and cerebral infarction without residual deficits: Secondary | ICD-10-CM | POA: Insufficient documentation

## 2021-07-03 DIAGNOSIS — E1122 Type 2 diabetes mellitus with diabetic chronic kidney disease: Secondary | ICD-10-CM | POA: Diagnosis not present

## 2021-07-03 DIAGNOSIS — F32A Depression, unspecified: Secondary | ICD-10-CM | POA: Insufficient documentation

## 2021-07-03 DIAGNOSIS — F17210 Nicotine dependence, cigarettes, uncomplicated: Secondary | ICD-10-CM | POA: Insufficient documentation

## 2021-07-03 DIAGNOSIS — I2510 Atherosclerotic heart disease of native coronary artery without angina pectoris: Secondary | ICD-10-CM | POA: Diagnosis not present

## 2021-07-03 DIAGNOSIS — Z951 Presence of aortocoronary bypass graft: Secondary | ICD-10-CM | POA: Diagnosis not present

## 2021-07-03 DIAGNOSIS — R0683 Snoring: Secondary | ICD-10-CM | POA: Diagnosis not present

## 2021-07-03 DIAGNOSIS — J449 Chronic obstructive pulmonary disease, unspecified: Secondary | ICD-10-CM | POA: Diagnosis not present

## 2021-07-03 DIAGNOSIS — I5032 Chronic diastolic (congestive) heart failure: Secondary | ICD-10-CM | POA: Diagnosis not present

## 2021-07-03 DIAGNOSIS — N1832 Chronic kidney disease, stage 3b: Secondary | ICD-10-CM | POA: Insufficient documentation

## 2021-07-03 NOTE — Patient Instructions (Signed)
There has been no changes to your medications. ? ?Your physician has requested that you have an echocardiogram. Echocardiography is a painless test that uses sound waves to create images of your heart. It provides your doctor with information about the size and shape of your heart and how well your heart?s chambers and valves are working. This procedure takes approximately one hour. There are no restrictions for this procedure. ? ?Your physician recommends that you schedule a follow-up appointment in: 6 months with an echocardiogram (September 2023)  **please call the office in July to arrange your follow up appointment** ? ?If you have any questions or concerns before your next appointment please send [place] a message through [place] or call our office at [phone].   ? ?TO LEAVE A MESSAGE FOR THE NURSE SELECT OPTION 2, PLEASE LEAVE A MESSAGE INCLUDING: ?YOUR NAME ?DATE OF BIRTH ?CALL BACK NUMBER ?REASON FOR CALL**this is important as we prioritize the call backs ? ?YOU WILL RECEIVE A CALL BACK THE SAME DAY AS LONG AS YOU CALL BEFORE 4:00 PM ? ?At the [facility], you and your health needs are our priority. As part of our continuing mission to provide you with exceptional heart care, we have created designated Provider Care Teams. These Care Teams include your primary Cardiologist (physician) and Advanced Practice Providers (APPs- Physician Assistants and Nurse Practitioners) who all work together to provide you with the care you need, when you need it.  ? ?You may see any of the following providers on your designated Care Team at your next follow up: ?Dr Glori Bickers ?Dr Loralie Champagne ?Darrick Grinder, NP ?Lyda Jester, PA ?Jessica Milford,NP ?Marlyce Huge, PA ?Audry Riles, PharmD ? ? ?Please be sure to bring in all your medications bottles to every appointment.  ?  ? ?

## 2021-07-03 NOTE — Addendum Note (Signed)
Encounter addended by: Jerl Mina, RN on: 07/03/2021 2:27 PM ? Actions taken: Visit diagnoses modified, Order list changed, Diagnosis association updated, Clinical Note Signed

## 2021-07-03 NOTE — Progress Notes (Signed)
? ?ADVANCED HF CLINIC NOTE ? ?Referring Physician:Mark Marlou Porch, MD ?Primary Care: Charlott Rakes, MD ?Primary Cardiologist: Candee Furbish, MD ? ? ?HPI: ? ?Deborah Coffey is a 66 y.o. with CAD s/p CABGx1 (LIMA-LAD) 2005, tobacco use, chronic diastolic CHF, anemia, depression, HLD, HTN, DM, CKD stage 3b, prior TIAs, pulmonary HTN (felt due to underlying lung disease/smoking), morbid obesity referred by Dr. Marlou Porch for further evaluation of her [place].  ?  ?Echocardiogram 12/22 EF 65-70% RV moderately dilated with moderate HK. Flattened septum Moderate TR RVSP est 116mHG   ? ?Chest CT scan 2/22. No PE ?  ?R/L cath on 04/16/21 ?- mLAD 75% LIMA to LAD is widely patent. pRCA 50 ?- EF 65% ?- RA 5 PA 79/18 (39) PCW 12  CO 6.4 L/min; CI PVR 4.2 ? ?Says she has been smoking since 66y/o. Smokes ~ 1/2 ppd. 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 Lives with daughter and 3 grandkids. ? ?Here for routine follow-up. Says she is getting out more. Has sleep study this Sunday. SOB with mild activity but feels it is improved. Has to take her time and get warmed. No CP. Does have pain in left shoulder that radiates to her fingers. No edema, orthopnea or PND. Complaint with meds. ? ?PFTs 2/23 ?FEV1 1.27 (61%) ?FVC 1.63 (61%) ?DLCO 43% ? ?Past Medical History:  ?Diagnosis Date  ? Allergy   ? Anemia   ? CAD in native artery 02/18/2004  ? Mid LAD lesoin just after Large D1 --> CABG x 1 LIMA-LAD  ? CAP (community acquired pneumonia) 08/07/2014  ? Chronic kidney disease, stage 3b ([place]   ? Depression   ? High cholesterol   ? History of blood transfusion 04/20/2003  ? "related to [facility]"   ? Hypertension   ? Lung nodules   ? Mild carotid artery disease ([place]   ? Pulmonary hypertension ([place]   ? S/P CABG x 1 02/18/2004  ? LIMA-LAD; patent by cath in 2010 (LAD lesion actually improved. competitive flow  ? TIA (transient ischemic attack)   ? Type II diabetes mellitus ([place]   ? ? ?Current Outpatient Medications  ?Medication Sig Dispense Refill   ? ACCU-CHEK FASTCLIX LANCETS MISC Use as directed to check blood glucose 3 times daily. E11.9 100 each 5  ? acetaminophen (TYLENOL) 500 MG tablet Take 1,000 mg by mouth every 6 (six) hours as needed for headache (pain).     ? Alcohol Swabs (B-D SINGLE USE SWABS REGULAR) PADS USE AS DIRECTED 100 each 0  ? aspirin EC 81 MG tablet Take 81 mg by mouth daily.    ? atorvastatin (LIPITOR) 10 MG tablet Take 1 tablet (10 mg total) by mouth daily. 90 tablet 0  ? Blood Glucose Monitoring Suppl (ACCU-CHEK NANO SMARTVIEW) w/Device KIT Use as directed to check blood glucose 3 times daily. E11.9 1 kit 0  ? clobetasol cream (TEMOVATE) 0.05 % Apply topically 2 (two) times daily. 60 g 0  ? Continuous Blood Gluc Sensor (DEXCOM G6 SENSOR) MISC Use to check blood sugar three times daily. Dx. E11.59 3 each 3  ? cyclobenzaprine (FLEXERIL) 10 MG tablet Take 1 tablet (10 mg total) by mouth 2 (two) times daily as needed for muscle spasms. 20 tablet 0  ? furosemide (LASIX) 20 MG tablet Take 2 tablets (40 mg total) by mouth daily. 60 tablet 2  ? gabapentin (NEURONTIN) 300 MG capsule Take orally 2 caps (600 mg) in the morning and 3 capsules (900 mg) in the  evening 450 capsule 1  ? glucose blood (ACCU-CHEK SMARTVIEW) test strip Use as instructed to test blood glucose 3 times daily. E11.9 100 each 12  ? insulin glargine, 2 Unit Dial, (TOUJEO MAX SOLOSTAR) 300 UNIT/ML Solostar Pen Inject 55 Units into the skin daily. 30 mL 6  ? Insulin Pen Needle (DROPLET PEN NEEDLES) 31G X 8 MM MISC USE WITH TOUJEO SOLOSTAR PEN EVERY DAY 100 each 4  ? lisinopril (ZESTRIL) 20 MG tablet Take 0.5 tablets (10 mg total) by mouth daily. 30 tablet 6  ? metFORMIN (GLUCOPHAGE-XR) 500 MG 24 hr tablet Take 2 tablets (1,000 mg total) by mouth 2 (two) times daily. 360 tablet 1  ? metoprolol tartrate (LOPRESSOR) 50 MG tablet Take 1 tablet (50 mg total) by mouth 2 (two) times daily. 180 tablet 1  ? Multiple Vitamin (MULTIVITAMIN WITH MINERALS) TABS tablet Take 1 tablet by  mouth daily.    ? nitroGLYCERIN (NITROSTAT) 0.4 MG SL tablet Place 1 tablet (0.4 mg total) under the tongue every 5 (five) minutes as needed for chest pain. 25 tablet 3  ? pantoprazole (PROTONIX) 40 MG tablet Take 1 tablet (40 mg total) by mouth 2 (two) times daily. 180 tablet 0  ? dapagliflozin propanediol (FARXIGA) 5 MG TABS tablet Take 1 tablet (5 mg total) by mouth daily before breakfast. (Patient not taking: Reported on 07/03/2021) 90 tablet 1  ? ?No current facility-administered medications for this encounter.  ? ? ?Allergies  ?Allergen Reactions  ? Ciprofloxacin Rash  ? ? ?  ?Social History  ? ?Socioeconomic History  ? Marital status: Divorced  ?  Spouse name: Not on file  ? Number of children: Not on file  ? Years of education: Not on file  ? Highest education level: Not on file  ?Occupational History  ? Not on file  ?Tobacco Use  ? Smoking status: Some Days  ?  Packs/day: 0.50  ?  Years: 42.00  ?  Pack years: 21.00  ?  Types: Cigarettes  ? Smokeless tobacco: Never  ?Vaping Use  ? Vaping Use: Never used  ?Substance and Sexual Activity  ? Alcohol use: Yes  ?  Alcohol/week: 1.0 - 2.0 standard drink  ?  Types: 1 - 2 Glasses of wine per week  ?  Comment: 08/07/2014 "glass of wine maybe 2 times/yr"  ? Drug use: No  ?  Types: Marijuana  ?  Comment: smoked pot years ago but not currently  ? Sexual activity: Not Currently  ?Other Topics Concern  ? Not on file  ?Social History Narrative  ? Not on file  ? ?Social Determinants of Health  ? ?Financial Resource Strain: Not on file  ?Food Insecurity: Not on file  ?Transportation Needs: Not on file  ?Physical Activity: Not on file  ?Stress: Not on file  ?Social Connections: Not on file  ?Intimate Partner Violence: Not on file  ? ? ?  ?Family History  ?Problem Relation Age of Onset  ? Heart disease Mother   ? Diabetes Mother   ? Cancer Mother   ?     cervical  ? Diabetes Father   ? Diabetes Sister   ? Heart disease Sister   ? Diabetes Brother   ? Heart attack Brother   ?  Lupus Sister   ? Heart failure Sister   ? Hepatitis C Sister   ? Heart failure Brother   ? Diabetes Brother   ? Hypertension Daughter   ? Colon cancer Neg Hx   ? ? ?  Vitals:  ? 07/03/21 1356  ?BP: 140/80  ?Pulse: 75  ?SpO2: 99%  ?Weight: 107.9 kg (237 lb 12.8 oz)  ? ?Filed Weights  ? 07/03/21 1356  ?Weight: 107.9 kg (237 lb 12.8 oz)  ?  ? ?PHYSICAL EXAM: ?General:  Obese woman. No resp difficulty ?HEENT: normal ?Neck: supple. no JVD. Carotids 2+ bilat; no bruits. No lymphadenopathy or thryomegaly appreciated. ?Cor: PMI nondisplaced. Regular rate & rhythm. No rubs, gallops or murmurs. ?Lungs: clear but decreased throughout  ?Abdomen: soft, nontender, nondistended. No hepatosplenomegaly. No bruits or masses. Good bowel sounds. ?Extremities: no cyanosis, clubbing, rash, edema ?Neuro: alert & orientedx3, cranial nerves grossly intact. moves all 4 extremities w/o difficulty. Affect pleasant ? ? ?ASSESSMENT & PLAN: ? ?1. Pulmonary HTN with RV strain ?- Echo 12/22 EF 65-70% RV moderately dilated with moderate HK. Flattened septum Moderate TR RVSP est 120mHG   ?- Chest CT scan 2/22. No PE ?- RHC 12/22 - RA 5 PA 79/18 (39) PCW 12  CO 6.4 L/min; CI PVR 4.2 ?- PFTs with significant restrictive/obstructive lung disease ?- Hall walk 95% -> 88% ?- ANA, ANCA, [place]-70, RF all negative ?- Sleep study pending ?- Suspect mostly WHO Group III. Given severity of lung disease would not try sildenafil given risk of shunting ?- Discussed need for weight loss and smoking cessation ?- Refer Pulmonary Rehab.  ?- I will see back in 6 months with echo  ? ?2. CAD ?- CABG 2005 LIMA-> LAD ?-cath on 04/16/21 - mLAD 75% LIMA to LAD is widely patent. pRCA 50 ?- No current angina ?- Continue ASA/statin ?- Management per Dr. SMarlou Porch? ?3. Morbid obesity ?- needs weight loss ?- Continue with [facility] Program ?- consider GLP1RA ? ?DGlori Bickers MD  ?2:12 PM ? ?

## 2021-07-05 ENCOUNTER — Ambulatory Visit (HOSPITAL_BASED_OUTPATIENT_CLINIC_OR_DEPARTMENT_OTHER): Payer: No Typology Code available for payment source | Attending: Family Medicine | Admitting: Internal Medicine

## 2021-07-05 ENCOUNTER — Other Ambulatory Visit: Payer: Self-pay

## 2021-07-05 VITALS — Ht 65.0 in | Wt 237.0 lb

## 2021-07-05 DIAGNOSIS — G4733 Obstructive sleep apnea (adult) (pediatric): Secondary | ICD-10-CM | POA: Insufficient documentation

## 2021-07-05 DIAGNOSIS — R29818 Other symptoms and signs involving the nervous system: Secondary | ICD-10-CM

## 2021-07-09 ENCOUNTER — Encounter (HOSPITAL_COMMUNITY): Payer: No Typology Code available for payment source | Admitting: Internal Medicine

## 2021-07-11 DIAGNOSIS — R29818 Other symptoms and signs involving the nervous system: Secondary | ICD-10-CM

## 2021-07-11 NOTE — Procedures (Signed)
? ? ?  Patient Name: Deborah, Coffey ?Study Date: 07/05/2021 ?Gender: Female ?D.O.B: 1955/12/12 ?Age (years): 17 ?Referring Provider: Arnoldo Morale ?Height (inches): 65 ?Interpreting Physician: Baird Lyons MD, ABSM ?Weight (lbs): 237 ?RPSGT: [place], [place] ?BMI: 39 ?MRN: [id number] ?Neck Size: 15.00 ? ?CLINICAL INFORMATION ?Sleep Study Type: NPSG ?Indication for sleep study: Depression, Diabetes, Hypertension, Obesity, Snoring ?Epworth Sleepiness Score: 9 ? ?SLEEP STUDY TECHNIQUE ?As per the AASM Manual for the Scoring of Sleep and Associated Events v2.3 (April 2016) with a hypopnea requiring 4% desaturations. ? ?The channels recorded and monitored were frontal, central and occipital EEG, electrooculogram (EOG), submentalis EMG (chin), nasal and oral airflow, thoracic and abdominal wall motion, anterior tibialis EMG, snore microphone, electrocardiogram, and pulse oximetry. ? ?MEDICATIONS ?Medications self-administered by patient taken the night of the study : none reported ? ?SLEEP ARCHITECTURE ?The study was initiated at 10:06:14 PM and ended at 4:43:27 AM. ? ?Sleep onset time was 63.5 minutes and the sleep efficiency was 52.4%%. The total sleep time was 208 minutes. ? ?Stage REM latency was 79.0 minutes. ? ?The patient spent 24.3%% of the night in stage N1 sleep, 65.1%% in stage N2 sleep, 0.0%% in stage N3 and 10.6% in REM. ? ?Alpha intrusion was absent. ? ?Supine sleep was 22.12%. ? ?RESPIRATORY PARAMETERS ?The overall apnea/hypopnea index (AHI) was 5.5 per hour. There were 0 total apneas, including 0 obstructive, 0 central and 0 mixed apneas. There were 19 hypopneas and 60 RERAs. ? ?The AHI during Stage REM sleep was 35.5 per hour. ? ?AHI while supine was 5.2 per hour. ? ?The mean oxygen saturation was 90.4%. The minimum SpO2 during sleep was 81.0%. ? ?loud snoring was noted during this study. ? ?CARDIAC DATA ?The 2 lead EKG demonstrated sinus rhythm. The mean heart rate was 64.3 beats per minute. Other EKG  findings include: None. ? ?LEG MOVEMENT DATA ?The total PLMS were 0 with a resulting PLMS index of 0.0. Associated arousal with leg movement index was 0.3 . ? ?IMPRESSIONS ?- Mild obstructive sleep apnea occurred during this study (AHI = 5.5/h). ?- Mild oxygen desaturation was noted during this study (Min O2 = 81.0%). Mean 91.4%. Time with O2 saturation 88% or less was 27.6 minutes. ?- The patient snored with loud snoring volume. ?- No cardiac abnormalities were noted during this study. ?- Clinically significant periodic limb movements did not occur during sleep. No significant associated arousals. ? ?DIAGNOSIS ?- Obstructive Sleep Apnea (G47.33) ?- Nocturnal Hypoxemia (G47.36) ? ?RECOMMENDATIONS ?- Very mild obstructive sleep apnea. Conservative measures may include observation, weight loss and sleep position off back. Other options, including CPAP, Sleep Medicine consultation, a fitted oral appliance, or ENT evaluation, would be based on clinical judgment. ?- Be careful with alcohol, sedatives and other CNS depressants that may worsen sleep apnea and disrupt normal sleep architecture. ?- Sleep hygiene should be reviewed to assess factors that may improve sleep quality. ?- Weight management and regular exercise should be initiated or continued if appropriate. ? ?[Electronically signed] 07/11/2021 10:22 AM ? ?Baird Lyons MD, ABSM ?Diplomate, [place] Board of Sleep Medicine ? ? ?NPI: [id number] ? ?  ? ? ? ? ? ? ? ? ? ? ? ? ? ? ? ? ? ? ? ? ? ?Shirrell Solinger ?Diplomate, Tax adviser of Sleep Medicine ? ?ELECTRONICALLY SIGNED ON:  07/11/2021, 10:17 AM ?[place] ?PH: [phone]   FX: [phone] ?ACCREDITED BY THE AMERICAN ACADEMY OF SLEEP MEDICINE ?

## 2021-07-15 ENCOUNTER — Telehealth: Payer: Self-pay | Admitting: *Deleted

## 2021-07-15 NOTE — Telephone Encounter (Signed)
You ordered a sleep study to exclude sleep apnea ( diagnosis of pulmonary HTN and right ventricular dysfunction). ?Patient had a diagnostic sleep study done on 07/05/21 read by dr Annamaria Boots. Her dx is very mild sleep apnea. Ordered by her PCP (Enobong Amao). ?

## 2021-07-16 ENCOUNTER — Other Ambulatory Visit: Payer: Self-pay

## 2021-07-17 ENCOUNTER — Other Ambulatory Visit: Payer: Self-pay

## 2021-07-17 NOTE — Telephone Encounter (Signed)
Noted, please make sure patient aware of results: ? ?- Very mild obstructive sleep apnea. Conservative measures may include observation, weight loss and sleep position off back. Other options, including CPAP, Sleep Medicine consultation, a fitted oral appliance, or ENT evaluation, would be based on clinical judgment. ?- Be careful with alcohol, sedatives and other CNS depressants that may worsen sleep apnea and disrupt normal sleep architecture. ?- Sleep hygiene should be reviewed to assess factors that may improve sleep quality. ?- Weight management and regular exercise should be initiated or continued if appropriate. ?  ? ?Would suggest sleep medicine consult with Dr. Radford Pax if she otherwise has not been referred to anyone for her sleep apnea. ?

## 2021-07-21 ENCOUNTER — Other Ambulatory Visit: Payer: Self-pay

## 2021-07-21 DIAGNOSIS — Z961 Presence of intraocular lens: Secondary | ICD-10-CM | POA: Diagnosis not present

## 2021-07-21 DIAGNOSIS — Z794 Long term (current) use of insulin: Secondary | ICD-10-CM | POA: Diagnosis not present

## 2021-07-21 DIAGNOSIS — E1136 Type 2 diabetes mellitus with diabetic cataract: Secondary | ICD-10-CM | POA: Diagnosis not present

## 2021-07-21 DIAGNOSIS — H5213 Myopia, bilateral: Secondary | ICD-10-CM | POA: Diagnosis not present

## 2021-07-21 DIAGNOSIS — H524 Presbyopia: Secondary | ICD-10-CM | POA: Diagnosis not present

## 2021-07-21 DIAGNOSIS — Z7984 Long term (current) use of oral hypoglycemic drugs: Secondary | ICD-10-CM | POA: Diagnosis not present

## 2021-07-21 DIAGNOSIS — H2512 Age-related nuclear cataract, left eye: Secondary | ICD-10-CM | POA: Diagnosis not present

## 2021-07-21 NOTE — Telephone Encounter (Signed)
Patient made aware.

## 2021-07-22 DIAGNOSIS — H5213 Myopia, bilateral: Secondary | ICD-10-CM | POA: Diagnosis not present

## 2021-07-22 DIAGNOSIS — H52209 Unspecified astigmatism, unspecified eye: Secondary | ICD-10-CM | POA: Diagnosis not present

## 2021-07-23 ENCOUNTER — Telehealth (HOSPITAL_COMMUNITY): Payer: Self-pay

## 2021-07-23 NOTE — Telephone Encounter (Signed)
Called to confirm appt. Pt confirmed appt. Instructed her on proper footwear and COVID screening. Gave directions to our department along with number.  ?

## 2021-07-27 ENCOUNTER — Ambulatory Visit (HOSPITAL_COMMUNITY): Payer: No Typology Code available for payment source

## 2021-08-04 ENCOUNTER — Ambulatory Visit (HOSPITAL_COMMUNITY): Payer: No Typology Code available for payment source

## 2021-08-06 ENCOUNTER — Ambulatory Visit (HOSPITAL_COMMUNITY): Payer: No Typology Code available for payment source

## 2021-08-11 ENCOUNTER — Ambulatory Visit (HOSPITAL_COMMUNITY): Payer: No Typology Code available for payment source

## 2021-08-12 ENCOUNTER — Telehealth (HOSPITAL_COMMUNITY): Payer: Self-pay

## 2021-08-12 ENCOUNTER — Encounter (HOSPITAL_COMMUNITY): Payer: Self-pay

## 2021-08-12 NOTE — Telephone Encounter (Signed)
Attempted to call patient in regards to Pulmonary Rehab - LM on VM Mailed letter 

## 2021-08-13 ENCOUNTER — Ambulatory Visit (HOSPITAL_COMMUNITY): Payer: No Typology Code available for payment source

## 2021-08-18 ENCOUNTER — Ambulatory Visit (HOSPITAL_COMMUNITY): Payer: No Typology Code available for payment source

## 2021-08-20 ENCOUNTER — Ambulatory Visit (HOSPITAL_COMMUNITY): Payer: No Typology Code available for payment source

## 2021-08-21 ENCOUNTER — Telehealth (HOSPITAL_COMMUNITY): Payer: Self-pay

## 2021-08-21 NOTE — Telephone Encounter (Signed)
Pt returned PR phone call and stated she is interested in rescheduling for PR. ?

## 2021-08-25 ENCOUNTER — Ambulatory Visit (HOSPITAL_COMMUNITY): Payer: No Typology Code available for payment source

## 2021-08-27 ENCOUNTER — Other Ambulatory Visit: Payer: Self-pay

## 2021-08-27 ENCOUNTER — Other Ambulatory Visit: Payer: Self-pay | Admitting: Family Medicine

## 2021-08-27 ENCOUNTER — Ambulatory Visit (HOSPITAL_COMMUNITY): Payer: No Typology Code available for payment source

## 2021-08-27 MED ORDER — FUROSEMIDE 20 MG PO TABS
40.0000 mg | ORAL_TABLET | Freq: Every day | ORAL | 2 refills | Status: DC
  Filled 2021-08-27: qty 60, 30d supply, fill #0
  Filled 2021-09-28: qty 60, 30d supply, fill #1
  Filled 2021-11-02: qty 60, 30d supply, fill #2

## 2021-08-31 ENCOUNTER — Encounter: Payer: Self-pay | Admitting: Family Medicine

## 2021-08-31 ENCOUNTER — Ambulatory Visit: Payer: No Typology Code available for payment source | Attending: Family Medicine | Admitting: Family Medicine

## 2021-08-31 ENCOUNTER — Other Ambulatory Visit: Payer: Self-pay

## 2021-08-31 VITALS — BP 126/77 | HR 70 | Ht 65.0 in | Wt 242.4 lb

## 2021-08-31 DIAGNOSIS — I272 Pulmonary hypertension, unspecified: Secondary | ICD-10-CM

## 2021-08-31 DIAGNOSIS — I5032 Chronic diastolic (congestive) heart failure: Secondary | ICD-10-CM

## 2021-08-31 DIAGNOSIS — Z794 Long term (current) use of insulin: Secondary | ICD-10-CM

## 2021-08-31 DIAGNOSIS — G4733 Obstructive sleep apnea (adult) (pediatric): Secondary | ICD-10-CM | POA: Insufficient documentation

## 2021-08-31 DIAGNOSIS — E1159 Type 2 diabetes mellitus with other circulatory complications: Secondary | ICD-10-CM | POA: Diagnosis not present

## 2021-08-31 DIAGNOSIS — I152 Hypertension secondary to endocrine disorders: Secondary | ICD-10-CM | POA: Diagnosis not present

## 2021-08-31 LAB — POCT GLYCOSYLATED HEMOGLOBIN (HGB A1C): HbA1c, POC (controlled diabetic range): 8.2 % — AB (ref 0.0–7.0)

## 2021-08-31 LAB — GLUCOSE, POCT (MANUAL RESULT ENTRY): POC Glucose: 169 mg/dl — AB (ref 70–99)

## 2021-08-31 MED ORDER — OZEMPIC (0.25 OR 0.5 MG/DOSE) 2 MG/3ML ~~LOC~~ SOPN
0.2500 mg | PEN_INJECTOR | SUBCUTANEOUS | 1 refills | Status: DC
  Filled 2021-08-31: qty 3, 28d supply, fill #0

## 2021-08-31 MED ORDER — METOPROLOL TARTRATE 50 MG PO TABS
50.0000 mg | ORAL_TABLET | Freq: Two times a day (BID) | ORAL | 1 refills | Status: DC
  Filled 2021-08-31: qty 180, 90d supply, fill #0

## 2021-08-31 NOTE — Patient Instructions (Signed)
Semaglutide Injection ?What is this medication? ?SEMAGLUTIDE (SEM a GLOO tide) treats type 2 diabetes. It works by increasing insulin levels in your body, which decreases your blood sugar (glucose). It also reduces the amount of sugar released into the blood and slows down your digestion. It can also be used to lower the risk of heart attack and stroke in people with type 2 diabetes. Changes to diet and exercise are often combined with this medication. ?This medicine may be used for other purposes; ask your health care provider or pharmacist if you have questions. ?COMMON BRAND NAME(S): OZEMPIC ?What should I tell my care team before I take this medication? ?They need to know if you have any of these conditions: ?Endocrine tumors (MEN 2) or if someone in your family had these tumors ?Eye disease, vision problems ?History of pancreatitis ?Kidney disease ?Stomach problems ?Thyroid cancer or if someone in your family had thyroid cancer ?An unusual or allergic reaction to semaglutide, other medications, foods, dyes, or preservatives ?Pregnant or trying to get pregnant ?Breast-feeding ?How should I use this medication? ?This medication is for injection under the skin of your upper leg (thigh), stomach area, or upper arm. It is given once every week (every 7 days). You will be taught how to prepare and give this medication. Use exactly as directed. Take your medication at regular intervals. Do not take it more often than directed. ?If you use this medication with insulin, you should inject this medication and the insulin separately. Do not mix them together. Do not give the injections right next to each other. Change (rotate) injection sites with each injection. ?It is important that you put your used needles and syringes in a special sharps container. Do not put them in a trash can. If you do not have a sharps container, call your pharmacist or care team to get one. ?A special MedGuide will be given to you by the  pharmacist with each prescription and refill. Be sure to read this information carefully each time. ?This medication comes with INSTRUCTIONS FOR USE. Ask your pharmacist for directions on how to use this medication. Read the information carefully. Talk to your pharmacist or care team if you have questions. ?Talk to your care team about the use of this medication in children. Special care may be needed. ?Overdosage: If you think you have taken too much of this medicine contact a poison control center or emergency room at once. ?NOTE: This medicine is only for you. Do not share this medicine with others. ?What if I miss a dose? ?If you miss a dose, take it as soon as you can within 5 days after the missed dose. Then take your next dose at your regular weekly time. If it has been longer than 5 days after the missed dose, do not take the missed dose. Take the next dose at your regular time. Do not take double or extra doses. If you have questions about a missed dose, contact your care team for advice. ?What may interact with this medication? ?Other medications for diabetes ?Many medications may cause changes in blood sugar, these include: ?Alcohol containing beverages ?Antiviral medications for HIV or AIDS ?Aspirin and aspirin-like medications ?Certain medications for blood pressure, heart disease, irregular heart beat ?Chromium ?Diuretics ?Female hormones, such as estrogens or progestins, birth control pills ?Fenofibrate ?Gemfibrozil ?Isoniazid ?Lanreotide ?Female hormones or anabolic steroids ?MAOIs like Carbex, Eldepryl, Marplan, Nardil, and Parnate ?Medications for weight loss ?Medications for allergies, asthma, cold, or cough ?Medications for depression,   anxiety, or psychotic disturbances ?Niacin ?Nicotine ?NSAIDs, medications for pain and inflammation, like ibuprofen or naproxen ?Octreotide ?Pasireotide ?Pentamidine ?Phenytoin ?Probenecid ?Quinolone antibiotics such as ciprofloxacin, levofloxacin, ofloxacin ?Some  herbal dietary supplements ?Steroid medications such as prednisone or cortisone ?Sulfamethoxazole; trimethoprim ?Thyroid hormones ?Some medications can hide the warning symptoms of low blood sugar (hypoglycemia). You may need to monitor your blood sugar more closely if you are taking one of these medications. These include: ?Beta-blockers, often used for high blood pressure or heart problems (examples include atenolol, metoprolol, propranolol) ?Clonidine ?Guanethidine ?Reserpine ?This list may not describe all possible interactions. Give your health care provider a list of all the medicines, herbs, non-prescription drugs, or dietary supplements you use. Also tell them if you smoke, drink alcohol, or use illegal drugs. Some items may interact with your medicine. ?What should I watch for while using this medication? ?Visit your care team for regular checks on your progress. ?Drink plenty of fluids while taking this medication. Check with your care team if you get an attack of severe diarrhea, nausea, and vomiting. The loss of too much body fluid can make it dangerous for you to take this medication. ?A test called the HbA1C (A1C) will be monitored. This is a simple blood test. It measures your blood sugar control over the last 2 to 3 months. You will receive this test every 3 to 6 months. ?Learn how to check your blood sugar. Learn the symptoms of low and high blood sugar and how to manage them. ?Always carry a quick-source of sugar with you in case you have symptoms of low blood sugar. Examples include hard sugar candy or glucose tablets. Make sure others know that you can choke if you eat or drink when you develop serious symptoms of low blood sugar, such as seizures or unconsciousness. They must get medical help at once. ?Tell your care team if you have high blood sugar. You might need to change the dose of your medication. If you are sick or exercising more than usual, you might need to change the dose of your  medication. ?Do not skip meals. Ask your care team if you should avoid alcohol. Many nonprescription cough and cold products contain sugar or alcohol. These can affect blood sugar. ?Pens should never be shared. Even if the needle is changed, sharing may result in passing of viruses like hepatitis or HIV. ?Wear a medical ID bracelet or chain, and carry a card that describes your disease and details of your medication and dosage times. ?Do not become pregnant while taking this medication. Women should inform their care team if they wish to become pregnant or think they might be pregnant. There is a potential for serious side effects to an unborn child. Talk to your care team for more information. ?What side effects may I notice from receiving this medication? ?Side effects that you should report to your care team as soon as possible: ?Allergic reactions--skin rash, itching, hives, swelling of the face, lips, tongue, or throat ?Change in vision ?Dehydration--increased thirst, dry mouth, feeling faint or lightheaded, headache, dark yellow or brown urine ?Gallbladder problems--severe stomach pain, nausea, vomiting, fever ?Heart palpitations--rapid, pounding, or irregular heartbeat ?Kidney injury--decrease in the amount of urine, swelling of the ankles, hands, or feet ?Pancreatitis--severe stomach pain that spreads to your back or gets worse after eating or when touched, fever, nausea, vomiting ?Thyroid cancer--new mass or lump in the neck, pain or trouble swallowing, trouble breathing, hoarseness ?Side effects that usually do not require medical   attention (report to your care team if they continue or are bothersome): ?Diarrhea ?Loss of appetite ?Nausea ?Stomach pain ?Vomiting ?This list may not describe all possible side effects. Call your doctor for medical advice about side effects. You may report side effects to FDA at 1-800-FDA-1088. ?Where should I keep my medication? ?Keep out of the reach of children. ?Store  unopened pens in a refrigerator between 2 and 8 degrees C (36 and 46 degrees F). Do not freeze. Protect from light and heat. After you first use the pen, it can be stored for 56 days at room temperature between 15 and

## 2021-08-31 NOTE — Progress Notes (Signed)
? ?Subjective:  ?Patient ID: Deborah Coffey, female    DOB: 04-04-1956  Age: 66 y.o. MRN: [id number] ? ?CC: Diabetes ? ? ?HPI ?Deborah Coffey is a 66 y.o. year old female with a history of Type 2 DM (A1c 8.2), Diabetic neuropathy, Hypertension, CAD (s/p CABG), pulmonary hypertension with RV strain, history of COVID. here for a follow-up visit  ? ?Interval History: ?Her A1c is 8.2 and she endorses compliance with her diabetic regimen but does not exercise. She states her sugars at home have been in the 130s She has no hypoglycemic symptoms from diabetic neuropathy is stable on her current regimen. ?Last eye exam was in 07/21/2021 with [facility] ? ?She had a visit with cardiology in 07/08/21 and was referred to pulmonary rehab at that time. ?Denies presence of chest pain, dyspnea. ? ?Sleep study revealed mild obstructive sleep apnea, AHI of 5.5/h. She states she is not open to trying a CPAP machine since the report revealed mild sleep apnea. ?Past Medical History:  ?Diagnosis Date  ? Allergy   ? Anemia   ? CAD in native artery 02/18/2004  ? Mid LAD lesoin just after Large D1 --> CABG x 1 LIMA-LAD  ? CAP (community acquired pneumonia) 08/07/2014  ? Chronic kidney disease, stage 3b ([place])   ? Depression   ? High cholesterol   ? History of blood transfusion 04/20/2003  ? "related to [facility]"   ? Hypertension   ? Lung nodules   ? Mild carotid artery disease ([place])   ? Pulmonary hypertension ([place])   ? S/P CABG x 1 02/18/2004  ? LIMA-LAD; patent by cath in 2010 (LAD lesion actually improved. competitive flow  ? TIA (transient ischemic attack)   ? Type II diabetes mellitus ([place])   ? ? ?Past Surgical History:  ?Procedure Laterality Date  ? CARDIAC CATHETERIZATION  2010  ? Previous LAD 95% lesion - now ~30-40%; patent LIMA with competitive flow  ? [place]; 1989  ? CHOLECYSTECTOMY  ~ 2012  ? CORONARY ARTERY BYPASS GRAFT  02/2004  ? "CABG X1" (03/22/2013); LIMA-LAD  ? RIGHT/LEFT HEART CATH AND CORONARY/GRAFT  ANGIOGRAPHY N/A 04/16/2021  ? Procedure: RIGHT/LEFT HEART CATH AND CORONARY/GRAFT ANGIOGRAPHY;  Surgeon: Jettie Booze, MD;  Location: [facility];  Service: Cardiovascular;  Laterality: N/A;  ? TUBAL LIGATION  1989  ? ? ?Family History  ?Problem Relation Age of Onset  ? Heart disease Mother   ? Diabetes Mother   ? Cancer Mother   ?     cervical  ? Diabetes Father   ? Diabetes Sister   ? Heart disease Sister   ? Diabetes Brother   ? Heart attack Brother   ? Lupus Sister   ? Heart failure Sister   ? Hepatitis C Sister   ? Heart failure Brother   ? Diabetes Brother   ? Hypertension Daughter   ? Colon cancer Neg Hx   ? ? ?Social History  ? ?Socioeconomic History  ? Marital status: Divorced  ?  Spouse name: Not on file  ? Number of children: Not on file  ? Years of education: Not on file  ? Highest education level: Not on file  ?Occupational History  ? Not on file  ?Tobacco Use  ? Smoking status: Some Days  ?  Packs/day: 0.50  ?  Years: 42.00  ?  Pack years: 21.00  ?  Types: Cigarettes  ? Smokeless tobacco: Never  ?Vaping Use  ? Vaping Use:  Never used  ?Substance and Sexual Activity  ? Alcohol use: Yes  ?  Alcohol/week: 1.0 - 2.0 standard drink  ?  Types: 1 - 2 Glasses of wine per week  ?  Comment: 08/07/2014 "glass of wine maybe 2 times/yr"  ? Drug use: No  ?  Types: Marijuana  ?  Comment: smoked pot years ago but not currently  ? Sexual activity: Not Currently  ?Other Topics Concern  ? Not on file  ?Social History Narrative  ? Not on file  ? ?Social Determinants of Health  ? ?Financial Resource Strain: Not on file  ?Food Insecurity: Not on file  ?Transportation Needs: Not on file  ?Physical Activity: Not on file  ?Stress: Not on file  ?Social Connections: Not on file  ? ? ?Allergies  ?Allergen Reactions  ? Ciprofloxacin Rash  ? ? ?Outpatient Medications Prior to Visit  ?Medication Sig Dispense Refill  ? ACCU-CHEK FASTCLIX LANCETS MISC Use as directed to check blood glucose 3 times daily. E11.9 100 each 5   ? acetaminophen (TYLENOL) 500 MG tablet Take 1,000 mg by mouth every 6 (six) hours as needed for headache (pain).     ? Alcohol Swabs (B-D SINGLE USE SWABS REGULAR) PADS USE AS DIRECTED 100 each 0  ? aspirin EC 81 MG tablet Take 81 mg by mouth daily.    ? atorvastatin (LIPITOR) 10 MG tablet Take 1 tablet (10 mg total) by mouth daily. 90 tablet 0  ? Blood Glucose Monitoring Suppl (ACCU-CHEK NANO SMARTVIEW) w/Device KIT Use as directed to check blood glucose 3 times daily. E11.9 1 kit 0  ? clobetasol cream (TEMOVATE) 0.05 % Apply topically 2 (two) times daily. 60 g 0  ? Continuous Blood Gluc Sensor (DEXCOM G6 SENSOR) MISC Use to check blood sugar three times daily. Dx. E11.59 3 each 3  ? cyclobenzaprine (FLEXERIL) 10 MG tablet Take 1 tablet (10 mg total) by mouth 2 (two) times daily as needed for muscle spasms. 20 tablet 0  ? dapagliflozin propanediol (FARXIGA) 5 MG TABS tablet Take 1 tablet (5 mg total) by mouth daily before breakfast. 90 tablet 1  ? furosemide (LASIX) 20 MG tablet Take 2 tablets (40 mg total) by mouth daily. 60 tablet 2  ? gabapentin (NEURONTIN) 300 MG capsule Take orally 2 caps (600 mg) in the morning and 3 capsules (900 mg) in the evening 450 capsule 1  ? glucose blood (ACCU-CHEK SMARTVIEW) test strip Use as instructed to test blood glucose 3 times daily. E11.9 100 each 12  ? insulin glargine, 2 Unit Dial, (TOUJEO MAX SOLOSTAR) 300 UNIT/ML Solostar Pen Inject 55 Units into the skin daily. 30 mL 6  ? Insulin Pen Needle (DROPLET PEN NEEDLES) 31G X 8 MM MISC USE WITH TOUJEO SOLOSTAR PEN EVERY DAY 100 each 4  ? lisinopril (ZESTRIL) 20 MG tablet Take 0.5 tablets (10 mg total) by mouth daily. 30 tablet 6  ? metFORMIN (GLUCOPHAGE-XR) 500 MG 24 hr tablet Take 2 tablets (1,000 mg total) by mouth 2 (two) times daily. 360 tablet 1  ? Multiple Vitamin (MULTIVITAMIN WITH MINERALS) TABS tablet Take 1 tablet by mouth daily.    ? nitroGLYCERIN (NITROSTAT) 0.4 MG SL tablet Place 1 tablet (0.4 mg total) under the  tongue every 5 (five) minutes as needed for chest pain. 25 tablet 3  ? pantoprazole (PROTONIX) 40 MG tablet Take 1 tablet (40 mg total) by mouth 2 (two) times daily. 180 tablet 0  ? metoprolol tartrate (LOPRESSOR) 50 MG tablet  Take 1 tablet (50 mg total) by mouth 2 (two) times daily. 180 tablet 1  ? ?No facility-administered medications prior to visit.  ? ? ? ?ROS ?Review of Systems  ?Constitutional:  Negative for activity change, appetite change and fatigue.  ?HENT:  Negative for congestion, sinus pressure and sore throat.   ?Eyes:  Negative for visual disturbance.  ?Respiratory:  Negative for cough, chest tightness, shortness of breath and wheezing.   ?Cardiovascular:  Negative for chest pain and palpitations.  ?Gastrointestinal:  Negative for abdominal distention, abdominal pain and constipation.  ?Endocrine: Negative for polydipsia.  ?Genitourinary:  Negative for dysuria and frequency.  ?Musculoskeletal:  Negative for arthralgias and back pain.  ?Skin:  Negative for rash.  ?Neurological:  Negative for tremors, light-headedness and numbness.  ?Hematological:  Does not bruise/bleed easily.  ?Psychiatric/Behavioral:  Negative for agitation and behavioral problems.   ? ?Objective:  ?BP 126/77   Pulse 70   Ht $R'5\' 5"'gB$  (1.651 m)   Wt 242 lb 6.4 oz (110 kg)   LMP  (LMP Unknown)   SpO2 96%   BMI 40.34 kg/m?  ? ? ?  08/31/2021  ?  3:49 PM 07/05/2021  ?  8:00 PM 07/03/2021  ?  1:56 PM  ?BP/Weight  ?Systolic BP 361  224  ?Diastolic BP 77  80  ?Wt. (Lbs) 242.4 237 237.8  ?BMI 40.34 kg/m2 39.44 kg/m2 39.57 kg/m2  ? ? ? ? ?Physical Exam ?Constitutional:   ?   Appearance: She is well-developed. She is obese.  ?Cardiovascular:  ?   Rate and Rhythm: Normal rate.  ?   Heart sounds: Normal heart sounds. No murmur heard. ?Pulmonary:  ?   Effort: Pulmonary effort is normal.  ?   Breath sounds: Normal breath sounds. No wheezing or rales.  ?Chest:  ?   Chest wall: No tenderness.  ?Abdominal:  ?   General: Bowel sounds are normal.  There is no distension.  ?   Palpations: Abdomen is soft. There is no mass.  ?   Tenderness: There is no abdominal tenderness.  ?Musculoskeletal:     ?   General: Normal range of motion.  ?   Right lower leg:

## 2021-09-01 ENCOUNTER — Ambulatory Visit (HOSPITAL_COMMUNITY): Payer: No Typology Code available for payment source

## 2021-09-01 ENCOUNTER — Telehealth (HOSPITAL_COMMUNITY): Payer: Self-pay

## 2021-09-01 ENCOUNTER — Encounter (HOSPITAL_COMMUNITY): Payer: Self-pay

## 2021-09-01 LAB — CMP14+EGFR
ALT: 19 IU/L (ref 0–32)
AST: 24 IU/L (ref 0–40)
Albumin/Globulin Ratio: 1.4 (ref 1.2–2.2)
Albumin: 3.8 g/dL (ref 3.8–4.8)
Alkaline Phosphatase: 128 IU/L — ABNORMAL HIGH (ref 44–121)
BUN/Creatinine Ratio: 17 (ref 12–28)
BUN: 20 mg/dL (ref 8–27)
Bilirubin Total: <0.2 mg/dL (ref 0.0–1.2)
CO2: 20 mmol/L (ref 20–29)
Calcium: 9.6 mg/dL (ref 8.7–10.3)
Chloride: 108 mmol/L — ABNORMAL HIGH (ref 96–106)
Creatinine, Ser: 1.21 mg/dL — ABNORMAL HIGH (ref 0.57–1.00)
Globulin, Total: 2.8 g/dL (ref 1.5–4.5)
Glucose: 140 mg/dL — ABNORMAL HIGH (ref 70–99)
Potassium: 5.2 mmol/L (ref 3.5–5.2)
Sodium: 142 mmol/L (ref 134–144)
Total Protein: 6.6 g/dL (ref 6.0–8.5)
eGFR: 50 mL/min/{1.73_m2} — ABNORMAL LOW (ref >59)

## 2021-09-01 NOTE — Telephone Encounter (Signed)
Pt returned PR phone call and expressed interest. Patient will come in for orientation on 09/04/21 @ 1PM and will attend the 10:15AM exercise class. ?  ?Tourist information centre manager.  ?

## 2021-09-01 NOTE — Telephone Encounter (Signed)
Attempted to call patient in regards to Pulmonary Rehab - LM on VM Mailed letter 

## 2021-09-02 ENCOUNTER — Ambulatory Visit (HOSPITAL_COMMUNITY)
Admission: RE | Admit: 2021-09-02 | Discharge: 2021-09-02 | Disposition: A | Discharge status: Home / Self Care | Payer: No Typology Code available for payment source | Source: Ambulatory Visit | Attending: Family Medicine | Admitting: Family Medicine

## 2021-09-02 DIAGNOSIS — F17210 Nicotine dependence, cigarettes, uncomplicated: Secondary | ICD-10-CM | POA: Diagnosis not present

## 2021-09-03 ENCOUNTER — Ambulatory Visit (HOSPITAL_COMMUNITY): Payer: No Typology Code available for payment source

## 2021-09-04 ENCOUNTER — Encounter (HOSPITAL_COMMUNITY): Payer: Self-pay

## 2021-09-04 ENCOUNTER — Other Ambulatory Visit: Payer: Self-pay | Admitting: Family Medicine

## 2021-09-04 ENCOUNTER — Encounter (HOSPITAL_COMMUNITY)
Admission: RE | Admit: 2021-09-04 | Discharge: 2021-09-04 | Disposition: A | Discharge status: Home / Self Care | Payer: No Typology Code available for payment source | Source: Ambulatory Visit | Attending: Internal Medicine | Admitting: Internal Medicine

## 2021-09-04 VITALS — BP 130/64 | HR 71 | Ht 65.0 in | Wt 239.2 lb

## 2021-09-04 DIAGNOSIS — I272 Pulmonary hypertension, unspecified: Secondary | ICD-10-CM

## 2021-09-04 DIAGNOSIS — I2729 Other secondary pulmonary hypertension: Secondary | ICD-10-CM | POA: Diagnosis not present

## 2021-09-04 DIAGNOSIS — R739 Hyperglycemia, unspecified: Secondary | ICD-10-CM | POA: Insufficient documentation

## 2021-09-04 DIAGNOSIS — E1169 Type 2 diabetes mellitus with other specified complication: Secondary | ICD-10-CM

## 2021-09-04 NOTE — Telephone Encounter (Signed)
Medication Refill - Medication: Rx #: [id number]  atorvastatin (LIPITOR) 10 MG tablet [[id number]]   Has the patient contacted their pharmacy? Yes.    Preferred Pharmacy (with phone number or street name):  [facility] [address] [place] [address]  Phone: [phone] Fax: [phone]  Hours: M-F 7:30a-6:00p    Has the patient been seen for an appointment in the last year OR does the patient have an upcoming appointment? Yes.    Agent: Please be advised that RX refills may take up to 3 business days. We ask that you follow-up with your pharmacy.

## 2021-09-04 NOTE — Progress Notes (Signed)
Deborah Coffey 66 y.o. female Pulmonary Rehab Orientation Note This patient who was referred to Pulmonary Rehab by Dr. Haroldine Laws with the diagnosis of Pulmonary Hypertension arrived today in Cardiac and Pulmonary Rehab. She  arrived with ambulatory normal gait. She  does not carry portable oxygen. Per pt, Deborah Coffey uses oxygen never. Color good, skin warm and dry. Patient is oriented to time and place. Patient's medical history, psychosocial health, and medications reviewed. Psychosocial assessment reveals pt lives with family. Deborah Coffey is currently retired. Pt hobbies include watching tv and spending time with others. Pt reports her stress level is high. Areas of stress/anxiety include family . Pt does exhibit signs of depression. Deborah Coffey is on medication for her depression. PHQ2/9 score 0/0. Deborah Coffey shows good  coping skills with positive outlook on life. Offered emotional support and reassurance. Will continue to monitor and evaluate progress toward psychosocial goal(s) of eliminating/ decreasing stressors. Physical assessment performed by Deborah Small, RN. Please see their orientation physical assessment note. Deborah Coffey reports she does take medications as prescribed. Patient states she follows a low sodium  diet. The patient has been trying to lose weight through a healthy diet and exercise program.. Pt's weight will be monitored closely. Demonstration and practice of PLB using pulse oximeter. Deborah Coffey able to return demonstration satisfactorily. Safety and hand hygiene in the exercise area reviewed with patient. Deborah Coffey voices understanding of the information reviewed. Department expectations discussed with patient and achievable goals were set. The patient shows enthusiasm about attending the program and we look forward to working with Deborah Coffey. Deborah Coffey completed a 6 min walk test today and is scheduled to begin exercise on 09/10/21 at 10:15 am.   1230-1400 Deborah Plumber, Deborah Coffey, ACSM-CEP

## 2021-09-04 NOTE — Progress Notes (Signed)
Pulmonary Rehab Orientation Physical Assessment Note  Physical assessment reveals  Pt is alert and oriented x 3.  Heart rate is normal, breath sounds clear to auscultation, no wheezes, rales, or rhonchi, pt takes shallow breaths. Asked pt to take deeper breaths which she complied.  She was unaware that she breaths shallow. Reports no cought. Bowel sounds present.  Pt denies abdominal discomfort, nausea, vomiting or diarrhea. Grip strength equal, strong. Distal pulses palpable; no swelling to lower extremities.Cherre Huger, BSN Cardiac and Training and development officer

## 2021-09-04 NOTE — Progress Notes (Signed)
Pulmonary Individual Treatment Plan  Patient Details  Name: Deborah Coffey MRN: [id number] Date of Birth: 01-11-1956 Referring Provider:   April Manson Pulmonary Rehab Walk Test from 09/04/2021 in [place]  Referring Provider [place]       Initial Encounter Date:  Flowsheet Row Pulmonary Rehab Walk Test from 09/04/2021 in [place]  Date 09/04/21       Visit Diagnosis: Pulmonary hypertension ([place])  Patient's Home Medications on Admission:   Current Outpatient Medications:    ACCU-CHEK FASTCLIX LANCETS MISC, Use as directed to check blood glucose 3 times daily. E11.9, Disp: 100 each, Rfl: 5   acetaminophen (TYLENOL) 500 MG tablet, Take 1,000 mg by mouth every 6 (six) hours as needed for headache (pain). , Disp: , Rfl:    Alcohol Swabs (B-D SINGLE USE SWABS REGULAR) PADS, USE AS DIRECTED, Disp: 100 each, Rfl: 0   aspirin EC 81 MG tablet, Take 81 mg by mouth daily., Disp: , Rfl:    atorvastatin (LIPITOR) 10 MG tablet, Take 1 tablet (10 mg total) by mouth daily., Disp: 90 tablet, Rfl: 0   Blood Glucose Monitoring Suppl (ACCU-CHEK NANO SMARTVIEW) w/Device KIT, Use as directed to check blood glucose 3 times daily. E11.9, Disp: 1 kit, Rfl: 0   clobetasol cream (TEMOVATE) 0.05 %, Apply topically 2 (two) times daily., Disp: 60 g, Rfl: 0   Continuous Blood Gluc Sensor (DEXCOM G6 SENSOR) MISC, Use to check blood sugar three times daily. Dx. E11.59, Disp: 3 each, Rfl: 3   cyclobenzaprine (FLEXERIL) 10 MG tablet, Take 1 tablet (10 mg total) by mouth 2 (two) times daily as needed for muscle spasms., Disp: 20 tablet, Rfl: 0   furosemide (LASIX) 20 MG tablet, Take 2 tablets (40 mg total) by mouth daily., Disp: 60 tablet, Rfl: 2   gabapentin (NEURONTIN) 300 MG capsule, Take orally 2 caps (600 mg) in the morning and 3 capsules (900 mg) in the evening, Disp: 450 capsule, Rfl: 1   glucose blood (ACCU-CHEK SMARTVIEW) test strip,  Use as instructed to test blood glucose 3 times daily. E11.9, Disp: 100 each, Rfl: 12   insulin glargine, 2 Unit Dial, (TOUJEO MAX SOLOSTAR) 300 UNIT/ML Solostar Pen, Inject 55 Units into the skin daily., Disp: 30 mL, Rfl: 6   Insulin Pen Needle (DROPLET PEN NEEDLES) 31G X 8 MM MISC, USE WITH TOUJEO SOLOSTAR PEN EVERY DAY, Disp: 100 each, Rfl: 4   lisinopril (ZESTRIL) 20 MG tablet, Take 0.5 tablets (10 mg total) by mouth daily., Disp: 30 tablet, Rfl: 6   metFORMIN (GLUCOPHAGE-XR) 500 MG 24 hr tablet, Take 2 tablets (1,000 mg total) by mouth 2 (two) times daily., Disp: 360 tablet, Rfl: 1   metoprolol tartrate (LOPRESSOR) 50 MG tablet, Take 1 tablet (50 mg total) by mouth 2 (two) times daily., Disp: 180 tablet, Rfl: 1   Multiple Vitamin (MULTIVITAMIN WITH MINERALS) TABS tablet, Take 1 tablet by mouth daily., Disp: , Rfl:    nitroGLYCERIN (NITROSTAT) 0.4 MG SL tablet, Place 1 tablet (0.4 mg total) under the tongue every 5 (five) minutes as needed for chest pain., Disp: 25 tablet, Rfl: 3   pantoprazole (PROTONIX) 40 MG tablet, Take 1 tablet (40 mg total) by mouth 2 (two) times daily., Disp: 180 tablet, Rfl: 0   Semaglutide,0.25 or 0.5MG /DOS, (OZEMPIC, 0.25 OR 0.5 MG/DOSE,) 2 MG/3ML SOPN, Inject 0.25 mg into the skin once a week., Disp: 3 mL, Rfl: 1   dapagliflozin propanediol (FARXIGA) 5 MG  TABS tablet, Take 1 tablet (5 mg total) by mouth daily before breakfast. 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 competitive flow   TIA (transient ischemic attack)    Type II diabetes mellitus ([facility])     Tobacco Use: Social History   Tobacco Use  Smoking Status Some Days   Packs/day: 0.50   Years: 42.00   Pack years: 21.00   Types: Cigarettes  Smokeless Tobacco Never    Labs: Review Flowsheet        Latest Ref Rng & Units 07/22/2020 01/21/2021 04/16/2021 04/27/2021  Labs for ITP Cardiac and Pulmonary Rehab  Cholestrol 100 - 199 mg/dL  161      LDL (calc) 0 - 99 mg/dL  70      HDL-C >39 mg/dL  72      Trlycerides 0 - 149 mg/dL  104      Hemoglobin A1c 0.0 - 7.0 % 9.1   9.4    7.8    PH, Arterial 7.350 - 7.450   7.355     PCO2 arterial 32.0 - 48.0 mmHg   39.7     Bicarbonate 20.0 - 28.0 mmol/L   22.2     23.6     25.4     TCO2 22 - 32 mmol/L   _0 Acid-base deficit 0.0 - 2.0 mmol/L   3.0     2.0     O2 Saturation %   97.0     70.0     72.0        08/31/2021  Labs for ITP Cardiac and Pulmonary Rehab  Cholestrol   LDL (calc)   HDL-C   Trlycerides   Hemoglobin A1c 8.2    PH, Arterial   PCO2 arterial   Bicarbonate   TCO2   Acid-base deficit   O2 Saturation       Multiple values from one day are sorted in reverse-chronological order        Capillary Blood Glucose: Lab Results  Component Value Date   GLUCAP 97 01/23/2020   GLUCAP 177 (H) 09/03/2016   GLUCAP 310 (H) 09/03/2016   GLUCAP 343 (H) 09/02/2016   GLUCAP 183 (H) 09/02/2016     Pulmonary Assessment Scores:  Pulmonary Assessment Scores     Row Name 09/04/21 1321         ADL UCSD   ADL Phase Entry     SOB Score total 69       CAT Score   CAT Score 23       mMRC Score   mMRC Score 4             UCSD: Self-administered rating of dyspnea associated with  activities of daily living (ADLs) 6-point scale (0 = "not at all" to 5 = "maximal or unable to do because of breathlessness")  Scoring  Scores range from 0 to 120.  Minimally important difference is 5 units  CAT: CAT can identify the health impairment of COPD patients and is better correlated with disease progression.  CAT has a scoring range of zero to 40. The CAT score is classified into four groups of low (less than 10), medium (10 - 20), high (21-30) and very high (31-40) based on the impact level of disease on health status. A CAT score over 10 suggests significant symptoms.  A worsening CAT score could be explained by an exacerbation, poor medication adherence, poor inhaler technique, or progression of COPD or comorbid conditions.  CAT MCID is 2 points  mMRC: mMRC (Modified Medical Research Council) Dyspnea Scale is used to assess the degree of baseline functional disability in patients of respiratory disease due to dyspnea. No minimal important difference is established. A decrease in score of 1 point or greater is considered a positive change.   Pulmonary Function Assessment:  Pulmonary Function Assessment - 09/04/21 1441       Breath   Bilateral Breath Sounds Clear;Decreased    Shortness of Breath Yes;Limiting activity;Fear of Shortness of Breath             Exercise Target Goals: Exercise Program Goal: Individual exercise prescription set using results from initial 6 min walk test and THRR while considering  patient's activity barriers and safety.   Exercise Prescription Goal: Initial exercise prescription builds to 30-45 minutes a day of aerobic activity, 2-3 days per week.  Home exercise guidelines will be given to patient during program as part of exercise prescription that the participant will acknowledge.  Activity Barriers & Risk Stratification:  Activity Barriers & Cardiac Risk Stratification - 09/04/21 1309       Activity Barriers & Cardiac Risk Stratification   Activity Barriers Deconditioning;Muscular Weakness;Shortness of Breath             6 Minute Walk:  6 Minute Walk     Row  Name 09/04/21 1443         6 Minute Walk   Phase Initial     Distance 620 feet     Walk Time 6 minutes     # of Rest Breaks 2  1:20-2:20, 3:20-4:40     MPH 1.17     METS 1.6     RPE 13     Perceived Dyspnea  3     VO2 Peak 5.58     Symptoms No     Resting HR 71 bpm     Resting BP 130/64     Resting Oxygen Saturation  94 %     Exercise Oxygen Saturation  during 6 min walk 92 %     Max Ex. HR 114 bpm     Max Ex. BP 152/78     2 Minute Post BP 132/70       Interval HR   1 Minute HR 96     2 Minute HR 95     3 Minute HR 106     4 Minute HR 97     5 Minute HR 114     6 Minute HR 105     2 Minute Post HR 74     Interval Heart Rate? Yes       Interval Oxygen   Interval Oxygen? Yes  Baseline Oxygen Saturation % 94 %     1 Minute Oxygen Saturation % 94 %     1 Minute Liters of Oxygen 0 L     2 Minute Oxygen Saturation % 97 %     2 Minute Liters of Oxygen 0 L     3 Minute Oxygen Saturation % 92 %     3 Minute Liters of Oxygen 0 L     4 Minute Oxygen Saturation % 95 %     4 Minute Liters of Oxygen 0 L     5 Minute Oxygen Saturation % 92 %     5 Minute Liters of Oxygen 0 L     6 Minute Oxygen Saturation % 98 %     6 Minute Liters of Oxygen 0 L     2 Minute Post Oxygen Saturation % 96 %     2 Minute Post Liters of Oxygen 0 L              Oxygen Initial Assessment:  Oxygen Initial Assessment - 09/04/21 1308       Home Oxygen   Home Oxygen Device None    Sleep Oxygen Prescription None    Home Exercise Oxygen Prescription None    Home Resting Oxygen Prescription None      Initial 6 min Walk   Oxygen Used None      Program Oxygen Prescription   Program Oxygen Prescription None      Intervention   Short Term Goals To learn and exhibit compliance with exercise, home and travel O2 prescription;To learn and understand importance of monitoring SPO2 with pulse oximeter and demonstrate accurate use of the pulse oximeter.;To learn and understand importance of  maintaining oxygen saturations>88%;To learn and demonstrate proper pursed lip breathing techniques or other breathing techniques. ;To learn and demonstrate proper use of respiratory medications    Long  Term Goals Exhibits compliance with exercise, home  and travel O2 prescription;Verbalizes importance of monitoring SPO2 with pulse oximeter and return demonstration;Maintenance of O2 saturations>88%;Exhibits proper breathing techniques, such as pursed lip breathing or other method taught during program session;Compliance with respiratory medication;Demonstrates proper use of MDI's             Oxygen Re-Evaluation:   Oxygen Discharge (Final Oxygen Re-Evaluation):   Initial Exercise Prescription:  Initial Exercise Prescription - 09/04/21 1400       Date of Initial Exercise RX and Referring Provider   Date 09/04/21    Referring Provider Bensimhon    Expected Discharge Date 11/12/21      NuStep   Level 1    SPM 60    Minutes 30      Prescription Details   Frequency (times per week) 2    Duration Progress to 30 minutes of continuous aerobic without signs/symptoms of physical distress      Intensity   THRR 40-80% of Max Heartrate 62-125    Ratings of Perceived Exertion 11-13    Perceived Dyspnea 0-4      Progression   Progression Continue to progress workloads to maintain intensity without signs/symptoms of physical distress.      Resistance Training   Training Prescription Yes    Weight red bands    Reps 10-15             Perform Capillary Blood Glucose checks as needed.  Exercise Prescription Changes:   Exercise Comments:   Exercise Goals and Review:   Exercise Goals  [place] Name 09/04/21 1310             Exercise Goals   Increase Physical Activity Yes       Intervention Provide advice, education, support and counseling about physical activity/exercise needs.;Develop an individualized exercise prescription for aerobic and resistive training based on  initial evaluation findings, risk stratification, comorbidities and participant's personal goals.       Expected Outcomes Short Term: Attend rehab on a regular basis to increase amount of physical activity.;Long Term: Add in home exercise to make exercise part of routine and to increase amount of physical activity.;Long Term: Exercising regularly at least 3-5 days a week.       Increase Strength and Stamina Yes       Intervention Provide advice, education, support and counseling about physical activity/exercise needs.;Develop an individualized exercise prescription for aerobic and resistive training based on initial evaluation findings, risk stratification, comorbidities and participant's personal goals.       Expected Outcomes Short Term: Increase workloads from initial exercise prescription for resistance, speed, and METs.;Short Term: Perform resistance training exercises routinely during rehab and add in resistance training at home;Long Term: Improve cardiorespiratory fitness, muscular endurance and strength as measured by increased METs and functional capacity (6MWT)       Able to understand and use rate of perceived exertion (RPE) scale Yes       Intervention Provide education and explanation on how to use RPE scale       Expected Outcomes Short Term: Able to use RPE daily in rehab to express subjective intensity level;Long Term:  Able to use RPE to guide intensity level when exercising independently       Able to understand and use Dyspnea scale Yes       Intervention Provide education and explanation on how to use Dyspnea scale       Expected Outcomes Short Term: Able to use Dyspnea scale daily in rehab to express subjective sense of shortness of breath during exertion;Long Term: Able to use Dyspnea scale to guide intensity level when exercising independently       Knowledge and understanding of Target Heart Rate Range (THRR) Yes       Intervention Provide education and explanation of THRR  including how the numbers were predicted and where they are located for reference       Expected Outcomes Short Term: Able to state/look up THRR;Long Term: Able to use THRR to govern intensity when exercising independently;Short Term: Able to use daily as guideline for intensity in rehab       Understanding of Exercise Prescription Yes       Intervention Provide education, explanation, and written materials on patient's individual exercise prescription       Expected Outcomes Short Term: Able to explain program exercise prescription;Long Term: Able to explain home exercise prescription to exercise independently                Exercise Goals Re-Evaluation :   Discharge Exercise Prescription (Final Exercise Prescription Changes):   Nutrition:  Target Goals: Understanding of nutrition guidelines, daily intake of sodium <1547m, cholesterol <205m calories 30% from fat and 7% or less from saturated fats, daily to have 5 or more servings of fruits and vegetables.  Biometrics:  Pre Biometrics - 09/04/21 1241       Pre Biometrics   Grip Strength 18 kg              Nutrition Therapy Plan and Nutrition Goals:  Nutrition Assessments:  MEDIFICTS Score Key: ?70 Need to make dietary changes  40-70 Heart Healthy Diet ? 40 Therapeutic Level Cholesterol Diet   Picture Your Plate Scores: <16 Unhealthy dietary pattern with much room for improvement. 41-50 Dietary pattern unlikely to meet recommendations for good health and room for improvement. 51-60 More healthful dietary pattern, with some room for improvement.  >60 Healthy dietary pattern, although there may be some specific behaviors that could be improved.    Nutrition Goals Re-Evaluation:   Nutrition Goals Discharge (Final Nutrition Goals Re-Evaluation):   Psychosocial: Target Goals: Acknowledge presence or absence of significant depression and/or stress, maximize coping skills, provide positive support system.  Participant is able to verbalize types and ability to use techniques and skills needed for reducing stress and depression.  Initial Review & Psychosocial Screening:  Initial Psych Review & Screening - 09/04/21 1302       Initial Review   Current issues with Current Depression    Comments Is on medication for depression      Family Dynamics   Good Support System? Yes    Comments daughter and grandchildren      Barriers   Psychosocial barriers to participate in program The patient should benefit from training in stress management and relaxation.      Screening Interventions   Interventions Encouraged to exercise    Expected Outcomes Short Term goal: Utilizing psychosocial counselor, staff and physician to assist with identification of specific Stressors or current issues interfering with healing process. Setting desired goal for each stressor or current issue identified.;Long Term Goal: Stressors or current issues are controlled or eliminated.;Short Term goal: Identification and review with participant of any Quality of Life or Depression concerns found by scoring the questionnaire.             Quality of Life Scores:  Scores of 19 and below usually indicate a poorer quality of life in these areas.  A difference of  2-3 points is a clinically meaningful difference.  A difference of 2-3 points in the total score of the Quality of Life Index has been associated with significant improvement in overall quality of life, self-image, physical symptoms, and general health in studies assessing change in quality of life.  PHQ-9: Review Flowsheet        09/04/2021 06/01/2021 04/27/2021 01/21/2021 07/22/2020  Depression screen PHQ 2/9  Decreased Interest 0 0 0 1 2  Down, Depressed, Hopeless 0 0 0 1 2  PHQ - 2 Score 0 0 0 2 4  Altered sleeping 0  0 1 1  Tired, decreased energy 0  0 1 1  Change in appetite 0  0 1 2  Feeling bad or failure about yourself  0  0 1 0  Trouble concentrating 0  0 0 1   Moving slowly or fidgety/restless 0  0 0 1  Suicidal thoughts 0  0 0 0  PHQ-9 Score 0  0 6 10  Difficult doing work/chores Not difficult at all             Interpretation of Total Score  Total Score Depression Severity:  1-4 = Minimal depression, 5-9 = Mild depression, 10-14 = Moderate depression, 15-19 = Moderately severe depression, 20-27 = Severe depression   Psychosocial Evaluation and Intervention:  Psychosocial Evaluation - 09/04/21 1303       Psychosocial Evaluation & Interventions   Interventions Stress management education;Relaxation education;Encouraged to exercise with the program and follow exercise prescription    Comments Gwen  does not seem to want to speak to a counselor. She is dealing with her mental health in her own way.    Expected Outcomes For Gwen to participate in Pulmonary Rehab    Continue Psychosocial Services  Follow up required by staff             Psychosocial Re-Evaluation:   Psychosocial Discharge (Final Psychosocial Re-Evaluation):   Education: Education Goals: Education classes will be provided on a weekly basis, covering required topics. Participant will state understanding/return demonstration of topics presented.  Learning Barriers/Preferences:  Learning Barriers/Preferences - 09/04/21 1442       Learning Barriers/Preferences   Learning Barriers None    Learning Preferences Audio;Computer/Internet;Group Instruction;Individual Instruction;Pictoral;Skilled Demonstration;Verbal Instruction;Video;Written Material             Education Topics: Risk Factor Reduction:  -Group instruction that is supported by a PowerPoint presentation. Instructor discusses the definition of a risk factor, different risk factors for pulmonary disease, and how the heart and lungs work together.     Nutrition for Pulmonary Patient:  -Group instruction provided by PowerPoint slides, verbal discussion, and written materials to support subject matter.  The instructor gives an explanation and review of healthy diet recommendations, which includes a discussion on weight management, recommendations for fruit and vegetable consumption, as well as protein, fluid, caffeine, fiber, sodium, sugar, and alcohol. Tips for eating when patients are short of breath are discussed.   Pursed Lip Breathing:  -Group instruction that is supported by demonstration and informational handouts. Instructor discusses the benefits of pursed lip and diaphragmatic breathing and detailed demonstration on how to preform both.     Oxygen Safety:  -Group instruction provided by PowerPoint, verbal discussion, and written material to support subject matter. There is an overview of "What is Oxygen" and "Why do we need it".  Instructor also reviews how to create a safe environment for oxygen use, the importance of using oxygen as prescribed, and the risks of noncompliance. There is a brief discussion on traveling with oxygen and resources the patient may utilize.   Oxygen Equipment:  -Group instruction provided by [facility] Staff utilizing handouts, written materials, and equipment demonstrations.   Signs and Symptoms:  -Group instruction provided by written material and verbal discussion to support subject matter. Warning signs and symptoms of infection, stroke, and heart attack are reviewed and when to call the physician/911 reinforced. Tips for preventing the spread of infection discussed.   Advanced Directives:  -Group instruction provided by verbal instruction and written material to support subject matter. Instructor reviews Advanced Directive laws and proper instruction for filling out document.   Pulmonary Video:  -Group video education that reviews the importance of medication and oxygen compliance, exercise, good nutrition, pulmonary hygiene, and pursed lip and diaphragmatic breathing for the pulmonary patient.   Exercise for the Pulmonary Patient:  -Group  instruction that is supported by a PowerPoint presentation. Instructor discusses benefits of exercise, core components of exercise, frequency, duration, and intensity of an exercise routine, importance of utilizing pulse oximetry during exercise, safety while exercising, and options of places to exercise outside of rehab.     Pulmonary Medications:  -Verbally interactive group education provided by instructor with focus on inhaled medications and proper administration.   Anatomy and Physiology of the Respiratory System and Intimacy:  -Group instruction provided by PowerPoint, verbal discussion, and written material to support subject matter. Instructor reviews respiratory cycle and anatomical components of the respiratory system and their functions. Instructor also reviews differences  in obstructive and restrictive respiratory diseases with examples of each. Intimacy, Sex, and Sexuality differences are reviewed with a discussion on how relationships can change when diagnosed with pulmonary disease. Common sexual concerns are reviewed.   MD DAY -A group question and answer session with a medical doctor that allows participants to ask questions that relate to their pulmonary disease state.   OTHER EDUCATION -Group or individual verbal, written, or video instructions that support the educational goals of the pulmonary rehab program.   Holiday Eating Survival Tips:  -Group instruction provided by PowerPoint slides, verbal discussion, and written materials to support subject matter. The instructor gives patients tips, tricks, and techniques to help them not only survive but enjoy the holidays despite the onslaught of food that accompanies the holidays.   Knowledge Questionnaire Score:  Knowledge Questionnaire Score - 09/04/21 1314       Knowledge Questionnaire Score   Pre Score 15/18             Core Components/Risk Factors/Patient Goals at Admission:  Personal Goals and Risk Factors  at Admission - 09/04/21 1305       Core Components/Risk Factors/Patient Goals on Admission    Weight Management Yes;Weight Loss    Improve shortness of breath with ADL's Yes    Intervention Provide education, individualized exercise plan and daily activity instruction to help decrease symptoms of SOB with activities of daily living.    Expected Outcomes Short Term: Improve cardiorespiratory fitness to achieve a reduction of symptoms when performing ADLs;Long Term: Be able to perform more ADLs without symptoms or delay the onset of symptoms    Stress Yes    Intervention Offer individual and/or small group education and counseling on adjustment to heart disease, stress management and health-related lifestyle change. Teach and support self-help strategies.;Refer participants experiencing significant psychosocial distress to appropriate mental health specialists for further evaluation and treatment. When possible, include family members and significant others in education/counseling sessions.    Expected Outcomes Short Term: Participant demonstrates changes in health-related behavior, relaxation and other stress management skills, ability to obtain effective social support, and compliance with psychotropic medications if prescribed.;Long Term: Emotional wellbeing is indicated by absence of clinically significant psychosocial distress or social isolation.             Core Components/Risk Factors/Patient Goals Review:    Core Components/Risk Factors/Patient Goals at Discharge (Final Review):    ITP Comments: Dr. Rodman Pickle is Medical Director for Pulmonary Rehab at [facility].

## 2021-09-07 ENCOUNTER — Other Ambulatory Visit: Payer: Self-pay

## 2021-09-07 MED ORDER — ATORVASTATIN CALCIUM 10 MG PO TABS
10.0000 mg | ORAL_TABLET | Freq: Every day | ORAL | 0 refills | Status: DC
  Filled 2021-09-07 – 2021-09-28 (×2): qty 90, 90d supply, fill #0

## 2021-09-07 NOTE — Telephone Encounter (Signed)
Requested Prescriptions  Pending Prescriptions Disp Refills  . atorvastatin (LIPITOR) 10 MG tablet 90 tablet 0    Sig: Take 1 tablet (10 mg total) by mouth daily.     Cardiovascular:  Antilipid - Statins Failed - 09/04/2021  5:16 PM      Failed - Lipid Panel in normal range within the last 12 months    Cholesterol, Total  Date Value Ref Range Status  01/21/2021 161 100 - 199 mg/dL Final   LDL Chol Calc (NIH)  Date Value Ref Range Status  01/21/2021 70 0 - 99 mg/dL Final   HDL  Date Value Ref Range Status  01/21/2021 72 >39 mg/dL Final   Triglycerides  Date Value Ref Range Status  01/21/2021 104 0 - 149 mg/dL Final         Passed - Patient is not pregnant      Passed - Valid encounter within last 12 months    Recent Outpatient Visits          1 week ago Type 2 diabetes mellitus with other circulatory complication, with long-term current use of insulin ([place])   [place], Charlane Ferretti, MD   3 months ago Encounter for [employer] annual wellness exam   [place], Elsie, [place]   4 months ago Type 2 diabetes mellitus with other circulatory complication, with long-term current use of insulin ([place])   [place], [place], MD   7 months ago Type 2 diabetes mellitus with other circulatory complication, with long-term current use of insulin [facility])   [place], [place], [place]   1 year ago Type 2 diabetes mellitus with other circulatory complication, with long-term current use of insulin [facility])   [place], [place], MD      Future Appointments            In 2 weeks Daisy Blossom, Jarome Matin, [place]   In 2 months Charlott Rakes, MD [place]

## 2021-09-08 ENCOUNTER — Ambulatory Visit (HOSPITAL_COMMUNITY): Payer: No Typology Code available for payment source

## 2021-09-09 NOTE — Progress Notes (Signed)
Pulmonary Individual Treatment Plan  Patient Details  Name: Deborah Coffey MRN: [id number] Date of Birth: 03/25/1956 Referring Provider:   April Manson Pulmonary Rehab Walk Test from 09/04/2021 in [place]  Referring Provider [place]       Initial Encounter Date:  Flowsheet Row Pulmonary Rehab Walk Test from 09/04/2021 in [facility]  Date 09/04/21       Visit Diagnosis: Pulmonary hypertension ([place])  Patient's Home Medications on Admission:   Current Outpatient Medications:    ACCU-CHEK FASTCLIX LANCETS MISC, Use as directed to check blood glucose 3 times daily. E11.9, Disp: 100 each, Rfl: 5   acetaminophen (TYLENOL) 500 MG tablet, Take 1,000 mg by mouth every 6 (six) hours as needed for headache (pain). , Disp: , Rfl:    Alcohol Swabs (B-D SINGLE USE SWABS REGULAR) PADS, USE AS DIRECTED, Disp: 100 each, Rfl: 0   aspirin EC 81 MG tablet, Take 81 mg by mouth daily., Disp: , Rfl:    Blood Glucose Monitoring Suppl (ACCU-CHEK NANO SMARTVIEW) w/Device KIT, Use as directed to check blood glucose 3 times daily. E11.9, Disp: 1 kit, Rfl: 0   clobetasol cream (TEMOVATE) 0.05 %, Apply topically 2 (two) times daily., Disp: 60 g, Rfl: 0   Continuous Blood Gluc Sensor (DEXCOM G6 SENSOR) MISC, Use to check blood sugar three times daily. Dx. E11.59, Disp: 3 each, Rfl: 3   cyclobenzaprine (FLEXERIL) 10 MG tablet, Take 1 tablet (10 mg total) by mouth 2 (two) times daily as needed for muscle spasms., Disp: 20 tablet, Rfl: 0   furosemide (LASIX) 20 MG tablet, Take 2 tablets (40 mg total) by mouth daily., Disp: 60 tablet, Rfl: 2   gabapentin (NEURONTIN) 300 MG capsule, Take orally 2 caps (600 mg) in the morning and 3 capsules (900 mg) in the evening, Disp: 450 capsule, Rfl: 1   glucose blood (ACCU-CHEK SMARTVIEW) test strip, Use as instructed to test blood glucose 3 times daily. E11.9, Disp: 100 each, Rfl: 12   insulin glargine, 2  Unit Dial, (TOUJEO MAX SOLOSTAR) 300 UNIT/ML Solostar Pen, Inject 55 Units into the skin daily., Disp: 30 mL, Rfl: 6   Insulin Pen Needle (DROPLET PEN NEEDLES) 31G X 8 MM MISC, USE WITH TOUJEO SOLOSTAR PEN EVERY DAY, Disp: 100 each, Rfl: 4   lisinopril (ZESTRIL) 20 MG tablet, Take 0.5 tablets (10 mg total) by mouth daily., Disp: 30 tablet, Rfl: 6   metFORMIN (GLUCOPHAGE-XR) 500 MG 24 hr tablet, Take 2 tablets (1,000 mg total) by mouth 2 (two) times daily., Disp: 360 tablet, Rfl: 1   metoprolol tartrate (LOPRESSOR) 50 MG tablet, Take 1 tablet (50 mg total) by mouth 2 (two) times daily., Disp: 180 tablet, Rfl: 1   Multiple Vitamin (MULTIVITAMIN WITH MINERALS) TABS tablet, Take 1 tablet by mouth daily., Disp: , Rfl:    nitroGLYCERIN (NITROSTAT) 0.4 MG SL tablet, Place 1 tablet (0.4 mg total) under the tongue every 5 (five) minutes as needed for chest pain., Disp: 25 tablet, Rfl: 3   pantoprazole (PROTONIX) 40 MG tablet, Take 1 tablet (40 mg total) by mouth 2 (two) times daily., Disp: 180 tablet, Rfl: 0   Semaglutide,0.25 or 0.5MG /DOS, (OZEMPIC, 0.25 OR 0.5 MG/DOSE,) 2 MG/3ML SOPN, Inject 0.25 mg into the skin once a week., Disp: 3 mL, Rfl: 1   atorvastatin (LIPITOR) 10 MG tablet, Take 1 tablet (10 mg total) by mouth daily., Disp: 90 tablet, Rfl: 0   dapagliflozin propanediol (FARXIGA) 5 MG  TABS tablet, Take 1 tablet (5 mg total) by mouth daily before breakfast. 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 competitive flow   TIA (transient ischemic attack)    Type II diabetes mellitus ([facility])     Tobacco Use: Social History   Tobacco Use  Smoking Status Some Days   Packs/day: 0.50   Years: 42.00   Pack years: 21.00   Types: Cigarettes  Smokeless Tobacco Never    Labs: Review Flowsheet        Latest Ref Rng & Units 07/22/2020 01/21/2021 04/16/2021 04/27/2021  Labs for ITP Cardiac and Pulmonary Rehab  Cholestrol 100 - 199 mg/dL  161      LDL (calc) 0 - 99 mg/dL  70      HDL-C >39 mg/dL  72      Trlycerides 0 - 149 mg/dL  104      Hemoglobin A1c 0.0 - 7.0 % 9.1   9.4    7.8    PH, Arterial 7.350 - 7.450   7.355     PCO2 arterial 32.0 - 48.0 mmHg   39.7     Bicarbonate 20.0 - 28.0 mmol/L   22.2     23.6     25.4     TCO2 22 - 32 mmol/L   _0 Acid-base deficit 0.0 - 2.0 mmol/L   3.0     2.0     O2 Saturation %   97.0     70.0     72.0        08/31/2021  Labs for ITP Cardiac and Pulmonary Rehab  Cholestrol   LDL (calc)   HDL-C   Trlycerides   Hemoglobin A1c 8.2    PH, Arterial   PCO2 arterial   Bicarbonate   TCO2   Acid-base deficit   O2 Saturation       Multiple values from one day are sorted in reverse-chronological order        Capillary Blood Glucose: Lab Results  Component Value Date   GLUCAP 97 01/23/2020   GLUCAP 177 (H) 09/03/2016   GLUCAP 310 (H) 09/03/2016   GLUCAP 343 (H) 09/02/2016   GLUCAP 183 (H) 09/02/2016     Pulmonary Assessment Scores:  Pulmonary Assessment Scores     Row Name 09/04/21 1321         ADL UCSD   ADL Phase Entry     SOB Score total 69       CAT Score   CAT Score 23       mMRC Score   mMRC Score 4             UCSD: Self-administered rating of dyspnea associated with  activities of daily living (ADLs) 6-point scale (0 = "not at all" to 5 = "maximal or unable to do because of breathlessness")  Scoring  Scores range from 0 to 120.  Minimally important difference is 5 units  CAT: CAT can identify the health impairment of COPD patients and is better correlated with disease progression.  CAT has a scoring range of zero to 40. The CAT score is classified into four groups of low (less than 10), medium (10 - 20), high (21-30) and very high (31-40) based on the impact level of disease on health status. A CAT score over 10 suggests significant symptoms.  A worsening CAT score could be explained by an exacerbation, poor medication adherence, poor inhaler technique, or progression of COPD or comorbid conditions.  CAT MCID is 2 points  mMRC: mMRC (Modified Medical Research Council) Dyspnea Scale is used to assess the degree of baseline functional disability in patients of respiratory disease due to dyspnea. No minimal important difference is established. A decrease in score of 1 point or greater is considered a positive change.   Pulmonary Function Assessment:  Pulmonary Function Assessment - 09/04/21 1441       Breath   Bilateral Breath Sounds Clear;Decreased    Shortness of Breath Yes;Limiting activity;Fear of Shortness of Breath             Exercise Target Goals: Exercise Program Goal: Individual exercise prescription set using results from initial 6 min walk test and THRR while considering  patient's activity barriers and safety.   Exercise Prescription Goal: Initial exercise prescription builds to 30-45 minutes a day of aerobic activity, 2-3 days per week.  Home exercise guidelines will be given to patient during program as part of exercise prescription that the participant will acknowledge.  Activity Barriers & Risk Stratification:  Activity Barriers & Cardiac Risk Stratification - 09/04/21 1309       Activity Barriers & Cardiac Risk Stratification   Activity Barriers Deconditioning;Muscular Weakness;Shortness of Breath             6 Minute Walk:  6 Minute Walk     Row  Name 09/04/21 1443         6 Minute Walk   Phase Initial     Distance 620 feet     Walk Time 6 minutes     # of Rest Breaks 2  1:20-2:20, 3:20-4:40     MPH 1.17     METS 1.6     RPE 13     Perceived Dyspnea  3     VO2 Peak 5.58     Symptoms No     Resting HR 71 bpm     Resting BP 130/64     Resting Oxygen Saturation  94 %     Exercise Oxygen Saturation  during 6 min walk 92 %     Max Ex. HR 114 bpm     Max Ex. BP 152/78     2 Minute Post BP 132/70       Interval HR   1 Minute HR 96     2 Minute HR 95     3 Minute HR 106     4 Minute HR 97     5 Minute HR 114     6 Minute HR 105     2 Minute Post HR 74     Interval Heart Rate? Yes       Interval Oxygen   Interval Oxygen? Yes  Baseline Oxygen Saturation % 94 %     1 Minute Oxygen Saturation % 94 %     1 Minute Liters of Oxygen 0 L     2 Minute Oxygen Saturation % 97 %     2 Minute Liters of Oxygen 0 L     3 Minute Oxygen Saturation % 92 %     3 Minute Liters of Oxygen 0 L     4 Minute Oxygen Saturation % 95 %     4 Minute Liters of Oxygen 0 L     5 Minute Oxygen Saturation % 92 %     5 Minute Liters of Oxygen 0 L     6 Minute Oxygen Saturation % 98 %     6 Minute Liters of Oxygen 0 L     2 Minute Post Oxygen Saturation % 96 %     2 Minute Post Liters of Oxygen 0 L              Oxygen Initial Assessment:  Oxygen Initial Assessment - 09/04/21 1308       Home Oxygen   Home Oxygen Device None    Sleep Oxygen Prescription None    Home Exercise Oxygen Prescription None    Home Resting Oxygen Prescription None      Initial 6 min Walk   Oxygen Used None      Program Oxygen Prescription   Program Oxygen Prescription None      Intervention   Short Term Goals To learn and exhibit compliance with exercise, home and travel O2 prescription;To learn and understand importance of monitoring SPO2 with pulse oximeter and demonstrate accurate use of the pulse oximeter.;To learn and understand importance of  maintaining oxygen saturations>88%;To learn and demonstrate proper pursed lip breathing techniques or other breathing techniques. ;To learn and demonstrate proper use of respiratory medications    Long  Term Goals Exhibits compliance with exercise, home  and travel O2 prescription;Verbalizes importance of monitoring SPO2 with pulse oximeter and return demonstration;Maintenance of O2 saturations>88%;Exhibits proper breathing techniques, such as pursed lip breathing or other method taught during program session;Compliance with respiratory medication;Demonstrates proper use of MDI's             Oxygen Re-Evaluation:  Oxygen Re-Evaluation     Row Name 09/08/21 0914             Program Oxygen Prescription   Program Oxygen Prescription None         Home Oxygen   Home Oxygen Device None       Sleep Oxygen Prescription None       Home Exercise Oxygen Prescription None       Home Resting Oxygen Prescription None         Goals/Expected Outcomes   Short Term Goals To learn and exhibit compliance with exercise, home and travel O2 prescription;To learn and understand importance of monitoring SPO2 with pulse oximeter and demonstrate accurate use of the pulse oximeter.;To learn and understand importance of maintaining oxygen saturations>88%;To learn and demonstrate proper pursed lip breathing techniques or other breathing techniques. ;To learn and demonstrate proper use of respiratory medications       Long  Term Goals Exhibits compliance with exercise, home  and travel O2 prescription;Verbalizes importance of monitoring SPO2 with pulse oximeter and return demonstration;Maintenance of O2 saturations>88%;Exhibits proper breathing techniques, such as pursed lip breathing or other method taught during program session;Compliance with respiratory medication;Demonstrates proper use of MDI's  Goals/Expected Outcomes Compliance and understanding of oxygen saturation monitoring and breathing techniques to  decrease shortness of breath.                Oxygen Discharge (Final Oxygen Re-Evaluation):  Oxygen Re-Evaluation - 09/08/21 0914       Program Oxygen Prescription   Program Oxygen Prescription None      Home Oxygen   Home Oxygen Device None    Sleep Oxygen Prescription None    Home Exercise Oxygen Prescription None    Home Resting Oxygen Prescription None      Goals/Expected Outcomes   Short Term Goals To learn and exhibit compliance with exercise, home and travel O2 prescription;To learn and understand importance of monitoring SPO2 with pulse oximeter and demonstrate accurate use of the pulse oximeter.;To learn and understand importance of maintaining oxygen saturations>88%;To learn and demonstrate proper pursed lip breathing techniques or other breathing techniques. ;To learn and demonstrate proper use of respiratory medications    Long  Term Goals Exhibits compliance with exercise, home  and travel O2 prescription;Verbalizes importance of monitoring SPO2 with pulse oximeter and return demonstration;Maintenance of O2 saturations>88%;Exhibits proper breathing techniques, such as pursed lip breathing or other method taught during program session;Compliance with respiratory medication;Demonstrates proper use of MDI's    Goals/Expected Outcomes Compliance and understanding of oxygen saturation monitoring and breathing techniques to decrease shortness of breath.             Initial Exercise Prescription:  Initial Exercise Prescription - 09/04/21 1400       Date of Initial Exercise RX and Referring Provider   Date 09/04/21    Referring Provider Bensimhon    Expected Discharge Date 11/12/21      NuStep   Level 1    SPM 60    Minutes 30      Prescription Details   Frequency (times per week) 2    Duration Progress to 30 minutes of continuous aerobic without signs/symptoms of physical distress      Intensity   THRR 40-80% of Max Heartrate 62-125    Ratings of Perceived  Exertion 11-13    Perceived Dyspnea 0-4      Progression   Progression Continue to progress workloads to maintain intensity without signs/symptoms of physical distress.      Resistance Training   Training Prescription Yes    Weight red bands    Reps 10-15             Perform Capillary Blood Glucose checks as needed.  Exercise Prescription Changes:   Exercise Comments:   Exercise Goals and Review:   Exercise Goals     Row Name 09/04/21 1310 09/08/21 0913           Exercise Goals   Increase Physical Activity Yes Yes      Intervention Provide advice, education, support and counseling about physical activity/exercise needs.;Develop an individualized exercise prescription for aerobic and resistive training based on initial evaluation findings, risk stratification, comorbidities and participant's personal goals. Provide advice, education, support and counseling about physical activity/exercise needs.;Develop an individualized exercise prescription for aerobic and resistive training based on initial evaluation findings, risk stratification, comorbidities and participant's personal goals.      Expected Outcomes Short Term: Attend rehab on a regular basis to increase amount of physical activity.;Long Term: Add in home exercise to make exercise part of routine and to increase amount of physical activity.;Long Term: Exercising regularly at least 3-5 days a week. Short Term: Attend rehab  on a regular basis to increase amount of physical activity.;Long Term: Add in home exercise to make exercise part of routine and to increase amount of physical activity.;Long Term: Exercising regularly at least 3-5 days a week.      Increase Strength and Stamina Yes Yes      Intervention Provide advice, education, support and counseling about physical activity/exercise needs.;Develop an individualized exercise prescription for aerobic and resistive training based on initial evaluation findings, risk  stratification, comorbidities and participant's personal goals. Provide advice, education, support and counseling about physical activity/exercise needs.;Develop an individualized exercise prescription for aerobic and resistive training based on initial evaluation findings, risk stratification, comorbidities and participant's personal goals.      Expected Outcomes Short Term: Increase workloads from initial exercise prescription for resistance, speed, and METs.;Short Term: Perform resistance training exercises routinely during rehab and add in resistance training at home;Long Term: Improve cardiorespiratory fitness, muscular endurance and strength as measured by increased METs and functional capacity (6MWT) Short Term: Increase workloads from initial exercise prescription for resistance, speed, and METs.;Short Term: Perform resistance training exercises routinely during rehab and add in resistance training at home;Long Term: Improve cardiorespiratory fitness, muscular endurance and strength as measured by increased METs and functional capacity (6MWT)      Able to understand and use rate of perceived exertion (RPE) scale Yes Yes      Intervention Provide education and explanation on how to use RPE scale Provide education and explanation on how to use RPE scale      Expected Outcomes Short Term: Able to use RPE daily in rehab to express subjective intensity level;Long Term:  Able to use RPE to guide intensity level when exercising independently Short Term: Able to use RPE daily in rehab to express subjective intensity level;Long Term:  Able to use RPE to guide intensity level when exercising independently      Able to understand and use Dyspnea scale Yes Yes      Intervention Provide education and explanation on how to use Dyspnea scale Provide education and explanation on how to use Dyspnea scale      Expected Outcomes Short Term: Able to use Dyspnea scale daily in rehab to express subjective sense of  shortness of breath during exertion;Long Term: Able to use Dyspnea scale to guide intensity level when exercising independently Short Term: Able to use Dyspnea scale daily in rehab to express subjective sense of shortness of breath during exertion;Long Term: Able to use Dyspnea scale to guide intensity level when exercising independently      Knowledge and understanding of Target Heart Rate Range (THRR) Yes Yes      Intervention Provide education and explanation of THRR including how the numbers were predicted and where they are located for reference Provide education and explanation of THRR including how the numbers were predicted and where they are located for reference      Expected Outcomes Short Term: Able to state/look up THRR;Long Term: Able to use THRR to govern intensity when exercising independently;Short Term: Able to use daily as guideline for intensity in rehab Short Term: Able to state/look up THRR;Long Term: Able to use THRR to govern intensity when exercising independently;Short Term: Able to use daily as guideline for intensity in rehab      Understanding of Exercise Prescription Yes Yes      Intervention Provide education, explanation, and written materials on patient's individual exercise prescription Provide education, explanation, and written materials on patient's individual exercise prescription  Expected Outcomes Short Term: Able to explain program exercise prescription;Long Term: Able to explain home exercise prescription to exercise independently Short Term: Able to explain program exercise prescription;Long Term: Able to explain home exercise prescription to exercise independently               Exercise Goals Re-Evaluation :  Exercise Goals Re-Evaluation     [place] Name 09/08/21 0913             Exercise Goal Re-Evaluation   Exercise Goals Review Increase Physical Activity;Increase Strength and Stamina;Able to understand and use rate of perceived exertion (RPE)  scale;Able to understand and use Dyspnea scale;Knowledge and understanding of Target Heart Rate Range (THRR);Understanding of Exercise Prescription       Comments Pt is scheduled to begin exercise this week. Will continue to monitor and progress as able.       Expected Outcomes Through exercise at rehab and home, the patient will decrease shortness of breath with daily activities and feel confident in carrying out an exercise regimen at home.                Discharge Exercise Prescription (Final Exercise Prescription Changes):   Nutrition:  Target Goals: Understanding of nutrition guidelines, daily intake of sodium '1500mg'$ , cholesterol '200mg'$ , calories 30% from fat and 7% or less from saturated fats, daily to have 5 or more servings of fruits and vegetables.  Biometrics:  Pre Biometrics - 09/04/21 1241       Pre Biometrics   Grip Strength 18 kg              Nutrition Therapy Plan and Nutrition Goals:   Nutrition Assessments:  MEDIFICTS Score Key: ?70 Need to make dietary changes  40-70 Heart Healthy Diet ? 40 Therapeutic Level Cholesterol Diet   Picture Your Plate Scores: <19 Unhealthy dietary pattern with much room for improvement. 41-50 Dietary pattern unlikely to meet recommendations for good health and room for improvement. 51-60 More healthful dietary pattern, with some room for improvement.  >60 Healthy dietary pattern, although there may be some specific behaviors that could be improved.    Nutrition Goals Re-Evaluation:   Nutrition Goals Discharge (Final Nutrition Goals Re-Evaluation):   Psychosocial: Target Goals: Acknowledge presence or absence of significant depression and/or stress, maximize coping skills, provide positive support system. Participant is able to verbalize types and ability to use techniques and skills needed for reducing stress and depression.  Initial Review & Psychosocial Screening:  Initial Psych Review & Screening - 09/04/21  1302       Initial Review   Current issues with Current Depression    Comments Is on medication for depression      Family Dynamics   Good Support System? Yes    Comments daughter and grandchildren      Barriers   Psychosocial barriers to participate in program The patient should benefit from training in stress management and relaxation.      Screening Interventions   Interventions Encouraged to exercise    Expected Outcomes Short Term goal: Utilizing psychosocial counselor, staff and physician to assist with identification of specific Stressors or current issues interfering with healing process. Setting desired goal for each stressor or current issue identified.;Long Term Goal: Stressors or current issues are controlled or eliminated.;Short Term goal: Identification and review with participant of any Quality of Life or Depression concerns found by scoring the questionnaire.             Quality of Life Scores:  Scores of 19 and below usually indicate a poorer quality of life in these areas.  A difference of  2-3 points is a clinically meaningful difference.  A difference of 2-3 points in the total score of the Quality of Life Index has been associated with significant improvement in overall quality of life, self-image, physical symptoms, and general health in studies assessing change in quality of life.  PHQ-9: Review Flowsheet        09/04/2021 06/01/2021 04/27/2021 01/21/2021 07/22/2020  Depression screen PHQ 2/9  Decreased Interest 0 0 0 1 2  Down, Depressed, Hopeless 0 0 0 1 2  PHQ - 2 Score 0 0 0 2 4  Altered sleeping 0  0 1 1  Tired, decreased energy 0  0 1 1  Change in appetite 0  0 1 2  Feeling bad or failure about yourself  0  0 1 0  Trouble concentrating 0  0 0 1  Moving slowly or fidgety/restless 0  0 0 1  Suicidal thoughts 0  0 0 0  PHQ-9 Score 0  0 6 10  Difficult doing work/chores Not difficult at all             Interpretation of Total Score  Total Score  Depression Severity:  1-4 = Minimal depression, 5-9 = Mild depression, 10-14 = Moderate depression, 15-19 = Moderately severe depression, 20-27 = Severe depression   Psychosocial Evaluation and Intervention:  Psychosocial Evaluation - 09/04/21 1303       Psychosocial Evaluation & Interventions   Interventions Stress management education;Relaxation education;Encouraged to exercise with the program and follow exercise prescription    Comments Gwen does not seem to want to speak to a counselor. She is dealing with her mental health in her own way.    Expected Outcomes For Gwen to participate in Pulmonary Rehab    Continue Psychosocial Services  Follow up required by staff             Psychosocial Re-Evaluation:  Psychosocial Re-Evaluation     [place] Name 09/09/21 [phone]             Psychosocial Re-Evaluation   Current issues with History of Depression;Current Depression       Comments [name] has not started the PR program yet but  is scheduled to start 09/10/21 at 10:30 am.       Expected Outcomes Will continue to monitor.       Interventions Relaxation education;Encouraged to attend Pulmonary Rehabilitation for the exercise       Continue Psychosocial Services  Follow up required by staff                Psychosocial Discharge (Final Psychosocial Re-Evaluation):  Psychosocial Re-Evaluation - 09/09/21 0955       Psychosocial Re-Evaluation   Current issues with History of Depression;Current Depression    Comments [name] has not started the PR program yet but  is scheduled to start 09/10/21 at 10:30 am.    Expected Outcomes Will continue to monitor.    Interventions Relaxation education;Encouraged to attend Pulmonary Rehabilitation for the exercise    Continue Psychosocial Services  Follow up required by staff             Education: Education Goals: Education classes will be provided on a weekly basis, covering required topics. Participant will state understanding/return  demonstration of topics presented.  Learning Barriers/Preferences:  Learning Barriers/Preferences - 09/04/21 1442       Learning Barriers/Preferences   Learning  Barriers None    Learning Preferences Audio;Computer/Internet;Group Instruction;Individual Instruction;Pictoral;Skilled Demonstration;Verbal Instruction;Video;Written Material             Education Topics: Risk Factor Reduction:  -Group instruction that is supported by a PowerPoint presentation. Instructor discusses the definition of a risk factor, different risk factors for pulmonary disease, and how the heart and lungs work together.     Nutrition for Pulmonary Patient:  -Group instruction provided by PowerPoint slides, verbal discussion, and written materials to support subject matter. The instructor gives an explanation and review of healthy diet recommendations, which includes a discussion on weight management, recommendations for fruit and vegetable consumption, as well as protein, fluid, caffeine, fiber, sodium, sugar, and alcohol. Tips for eating when patients are short of breath are discussed.   Pursed Lip Breathing:  -Group instruction that is supported by demonstration and informational handouts. Instructor discusses the benefits of pursed lip and diaphragmatic breathing and detailed demonstration on how to preform both.     Oxygen Safety:  -Group instruction provided by PowerPoint, verbal discussion, and written material to support subject matter. There is an overview of "What is Oxygen" and "Why do we need it".  Instructor also reviews how to create a safe environment for oxygen use, the importance of using oxygen as prescribed, and the risks of noncompliance. There is a brief discussion on traveling with oxygen and resources the patient may utilize.   Oxygen Equipment:  -Group instruction provided by [facility] Staff utilizing handouts, written materials, and equipment demonstrations.   Signs and Symptoms:   -Group instruction provided by written material and verbal discussion to support subject matter. Warning signs and symptoms of infection, stroke, and heart attack are reviewed and when to call the physician/911 reinforced. Tips for preventing the spread of infection discussed.   Advanced Directives:  -Group instruction provided by verbal instruction and written material to support subject matter. Instructor reviews Advanced Directive laws and proper instruction for filling out document.   Pulmonary Video:  -Group video education that reviews the importance of medication and oxygen compliance, exercise, good nutrition, pulmonary hygiene, and pursed lip and diaphragmatic breathing for the pulmonary patient.   Exercise for the Pulmonary Patient:  -Group instruction that is supported by a PowerPoint presentation. Instructor discusses benefits of exercise, core components of exercise, frequency, duration, and intensity of an exercise routine, importance of utilizing pulse oximetry during exercise, safety while exercising, and options of places to exercise outside of rehab.     Pulmonary Medications:  -Verbally interactive group education provided by instructor with focus on inhaled medications and proper administration.   Anatomy and Physiology of the Respiratory System and Intimacy:  -Group instruction provided by PowerPoint, verbal discussion, and written material to support subject matter. Instructor reviews respiratory cycle and anatomical components of the respiratory system and their functions. Instructor also reviews differences in obstructive and restrictive respiratory diseases with examples of each. Intimacy, Sex, and Sexuality differences are reviewed with a discussion on how relationships can change when diagnosed with pulmonary disease. Common sexual concerns are reviewed.   MD DAY -A group question and answer session with a medical doctor that allows participants to ask questions  that relate to their pulmonary disease state.   OTHER EDUCATION -Group or individual verbal, written, or video instructions that support the educational goals of the pulmonary rehab program.   Holiday Eating Survival Tips:  -Group instruction provided by PowerPoint slides, verbal discussion, and written materials to support subject matter. The instructor gives patients tips,  tricks, and techniques to help them not only survive but enjoy the holidays despite the onslaught of food that accompanies the holidays.   Knowledge Questionnaire Score:  Knowledge Questionnaire Score - 09/04/21 1314       Knowledge Questionnaire Score   Pre Score 15/18             Core Components/Risk Factors/Patient Goals at Admission:  Personal Goals and Risk Factors at Admission - 09/04/21 1305       Core Components/Risk Factors/Patient Goals on Admission    Weight Management Yes;Weight Loss    Improve shortness of breath with ADL's Yes    Intervention Provide education, individualized exercise plan and daily activity instruction to help decrease symptoms of SOB with activities of daily living.    Expected Outcomes Short Term: Improve cardiorespiratory fitness to achieve a reduction of symptoms when performing ADLs;Long Term: Be able to perform more ADLs without symptoms or delay the onset of symptoms    Stress Yes    Intervention Offer individual and/or small group education and counseling on adjustment to heart disease, stress management and health-related lifestyle change. Teach and support self-help strategies.;Refer participants experiencing significant psychosocial distress to appropriate mental health specialists for further evaluation and treatment. When possible, include family members and significant others in education/counseling sessions.    Expected Outcomes Short Term: Participant demonstrates changes in health-related behavior, relaxation and other stress management skills, ability to obtain  effective social support, and compliance with psychotropic medications if prescribed.;Long Term: Emotional wellbeing is indicated by absence of clinically significant psychosocial distress or social isolation.             Core Components/Risk Factors/Patient Goals Review:   Goals and Risk Factor Review     Row Name 09/09/21 0957             Core Components/Risk Factors/Patient Goals Review   Personal Goals Review Weight Management/Obesity;Improve shortness of breath with ADL's;Increase knowledge of respiratory medications and ability to use respiratory devices properly.;Develop more efficient breathing techniques such as purse lipped breathing and diaphragmatic breathing and practicing self-pacing with activity.       Review [name] is starting the PR program 09/10/21 will continue to monitor core components.       Expected Outcomes See admission goals.                Core Components/Risk Factors/Patient Goals at Discharge (Final Review):   Goals and Risk Factor Review - 09/09/21 0957       Core Components/Risk Factors/Patient Goals Review   Personal Goals Review Weight Management/Obesity;Improve shortness of breath with ADL's;Increase knowledge of respiratory medications and ability to use respiratory devices properly.;Develop more efficient breathing techniques such as purse lipped breathing and diaphragmatic breathing and practicing self-pacing with activity.    Review [name] is starting the PR program 09/10/21 will continue to monitor core components.    Expected Outcomes See admission goals.             ITP Comments:   Comments: Pt is making expected progress toward Pulmonary Rehab goals after completing 0 sessions. Recommend continued exercise, life style modification, education, and utilization of breathing techniques to increase stamina and strength, while also decreasing shortness of breath with exertion.  Dr. Rodman Pickle is Medical Director for Pulmonary Rehab at  [facility].

## 2021-09-10 ENCOUNTER — Telehealth (HOSPITAL_COMMUNITY): Payer: Self-pay | Admitting: *Deleted

## 2021-09-10 ENCOUNTER — Ambulatory Visit (HOSPITAL_COMMUNITY): Payer: No Typology Code available for payment source

## 2021-09-10 ENCOUNTER — Encounter (HOSPITAL_COMMUNITY)
Admission: RE | Admit: 2021-09-10 | Discharge: 2021-09-10 | Disposition: A | Discharge status: Home / Self Care | Payer: No Typology Code available for payment source | Source: Ambulatory Visit | Attending: Internal Medicine | Admitting: Internal Medicine

## 2021-09-10 DIAGNOSIS — I272 Pulmonary hypertension, unspecified: Secondary | ICD-10-CM

## 2021-09-10 DIAGNOSIS — I2729 Other secondary pulmonary hypertension: Secondary | ICD-10-CM | POA: Diagnosis not present

## 2021-09-10 LAB — GLUCOSE, CAPILLARY: Glucose-Capillary: 80 mg/dL (ref 70–99)

## 2021-09-10 NOTE — Progress Notes (Signed)
Deborah Coffey arrived to pulmonary rehab today for her first day of exercise.  We checked her CBG which was 80 @ 1030.  She ate a egg and sausage sandwich @ 0815.  She reports not checking her CBG's at home because her meter does not work.  She did not stay to exercise d/t department rules, CBG needs to be above 110 to exercise.  Department dietician counselled on diet for diabetics.  Instructed to call her PCP for concerns of low CBG's, order for a new CBG meter, and evaluation of her diabetes medications.

## 2021-09-10 NOTE — Progress Notes (Signed)
Deborah Coffey 66 y.o. female Nutrition Note Deborah Coffey is motivated to make lifestyle changes to aid with cardiac/pulmonary rehab. Patient has medical history of DM2, CHF, pulmonary HTN, OSA, diabetic neurophathy, CAD s/p CABG.  Blood sugar today was 80 prior to exercise at ~10:15am; she did eat breakfast at 8:15am (white hamburger bun with egg, sausage). She was not allowed to exercise due to low blood sugar. She reports not taking Ozempic and not checking blood sugar. She is prescribed farxiga, Toujeo (55 units?), metformin. She quit smoking one week ago. She declined CPAP as sleep study revealed mild sleep apnea. She is receptive to checking blood sugar while she is participating in pulmonary rehab; however, she admits that she is unlikely to commit to this long term.   Labs: Cr. 1.21, GFR 50, A1c 8.2,   RMR: 1641, (x1.2, 1969kcals)  24 hour diet recall: Breakfast: white hamburger bun with sausage and egg Lunch: did not discuss Dinner: manwhich on white bun Beverages: Diet soda, water  Nutrition Diagnosis Food-and nutrition-related knowledge deficit related to lack of exposure to information as related to diagnosis of: DM2, CAD Obesity related to excessive energy intake as evidenced by a BMI 39.8  Nutrition Intervention Pt's individual nutrition plan reviewed with pt. Benefits of adopting Heart Healthy diet discussed.  Continue client-centered nutrition education by RD, as part of interdisciplinary care.  Monitor/Evaluation: Patient reports motivation to make lifestyle changes for adherence to heart healthy diet recommendation, blood sugar control, and weight management. We discussed the plate method as a guide for meal planning, whole grains, and adding in a carbohydrate + protein snack prior to exercise. Stressed the importance of checking blood sugar as she begins making dietary changes. Patient amicable to RD suggestions and verbalizes understanding. Will follow-up for ongoing support  for blood sugar control and heart healthy dietary changes/recommendations.   20 minutes spent in review of topics related to a heart healthy diet including sodium intake, blood sugar control, weight management, and fiber intake.  Goal(s) Pt to identify and limit food sources of saturated fat, trans fat, refined carbohydrates and sodium Pt to identify food quantities necessary to achieve weight loss of 6-24 lb at graduation from cardiac rehab.  Pt able to name foods that affect blood glucose. Continue to limit simple sugars, refined carbohydrates, sugary beverages, etc.  Pt to describe the benefit of including lean protein/plant proteins, fruits, vegetables, whole grains, nuts/seeds, and low-fat dairy products in a heart healthy meal plan. Pt to practice mindful and intuitive eating exercises  Plan:  Will provide client-centered nutrition education as part of interdisciplinary care Monitor and evaluate progress toward nutrition goal with team.   Deborah Coffey [place], MS, RDN, LDN

## 2021-09-15 ENCOUNTER — Encounter (HOSPITAL_COMMUNITY): Admission: RE | Admit: 2021-09-15 | Payer: No Typology Code available for payment source | Source: Ambulatory Visit

## 2021-09-15 ENCOUNTER — Ambulatory Visit (HOSPITAL_COMMUNITY): Payer: No Typology Code available for payment source

## 2021-09-15 ENCOUNTER — Other Ambulatory Visit: Payer: Self-pay | Admitting: Pharmacist

## 2021-09-15 ENCOUNTER — Telehealth (HOSPITAL_COMMUNITY): Payer: Self-pay | Admitting: *Deleted

## 2021-09-15 ENCOUNTER — Other Ambulatory Visit: Payer: Self-pay

## 2021-09-15 ENCOUNTER — Telehealth: Payer: Self-pay | Admitting: Family Medicine

## 2021-09-15 DIAGNOSIS — Z794 Long term (current) use of insulin: Secondary | ICD-10-CM

## 2021-09-15 DIAGNOSIS — E1159 Type 2 diabetes mellitus with other circulatory complications: Secondary | ICD-10-CM

## 2021-09-15 MED ORDER — ONETOUCH VERIO REFLECT W/DEVICE KIT
PACK | 0 refills | Status: DC
  Filled 2021-09-15: qty 1, 33d supply, fill #0

## 2021-09-15 MED ORDER — ONETOUCH VERIO VI STRP
ORAL_STRIP | 2 refills | Status: DC
  Filled 2021-09-15: qty 100, 33d supply, fill #0

## 2021-09-15 MED ORDER — ONETOUCH DELICA PLUS LANCET33G MISC
2 refills | Status: DC
  Filled 2021-09-15: qty 100, 33d supply, fill #0

## 2021-09-15 NOTE — Telephone Encounter (Signed)
I looked up pt's insurer. Plan is [employer]. Covered lancets and strips are OneTouch. Sent supplies to our pharmacy. Per her formulary, Dexcom is not covered.

## 2021-09-15 NOTE — Telephone Encounter (Signed)
Attempt to call patient to find out blood sugar readings. Patient noted to have Dexcom device.

## 2021-09-15 NOTE — Telephone Encounter (Signed)
Deborah Coffey called to say she has not been able to get and appt with her PCP re: getting a new glucose meter and reviewing her diabetes medications due to the holiday.  She will call today and will be absent from pulmonary rehab until her blood sugars are controlled.

## 2021-09-15 NOTE — Telephone Encounter (Signed)
Pt states the Pulmonary Rehab people want her to get a glucometer to check her blood sugar every day.  Her sugars have been running low.  She cannot return to them until her sugars are more managed.  Pt prefers to see Dr Margarita Rana. Please advise. They have notes in her chart supporting this information. Please advise  [place] [facility]

## 2021-09-16 NOTE — Telephone Encounter (Signed)
Left message on voicemail to inform patient supplies are at the pharmacy. Attempt to call to get reading of blood sugar to see if medication adjustment are needed by PCP.

## 2021-09-17 ENCOUNTER — Encounter (HOSPITAL_COMMUNITY): Payer: No Typology Code available for payment source

## 2021-09-17 ENCOUNTER — Ambulatory Visit (HOSPITAL_COMMUNITY): Payer: No Typology Code available for payment source

## 2021-09-18 ENCOUNTER — Other Ambulatory Visit: Payer: Self-pay

## 2021-09-22 ENCOUNTER — Encounter (HOSPITAL_COMMUNITY): Payer: No Typology Code available for payment source

## 2021-09-22 ENCOUNTER — Ambulatory Visit (HOSPITAL_COMMUNITY): Payer: No Typology Code available for payment source

## 2021-09-24 ENCOUNTER — Encounter (HOSPITAL_COMMUNITY): Payer: No Typology Code available for payment source

## 2021-09-24 ENCOUNTER — Ambulatory Visit (HOSPITAL_COMMUNITY): Payer: No Typology Code available for payment source

## 2021-09-24 ENCOUNTER — Telehealth (HOSPITAL_COMMUNITY): Payer: Self-pay | Admitting: *Deleted

## 2021-09-25 ENCOUNTER — Ambulatory Visit: Payer: No Typology Code available for payment source | Admitting: Pharmacist

## 2021-09-28 ENCOUNTER — Other Ambulatory Visit: Payer: Self-pay

## 2021-09-29 ENCOUNTER — Ambulatory Visit (HOSPITAL_COMMUNITY): Payer: No Typology Code available for payment source

## 2021-09-29 ENCOUNTER — Encounter (HOSPITAL_COMMUNITY): Payer: No Typology Code available for payment source

## 2021-09-29 ENCOUNTER — Telehealth (HOSPITAL_COMMUNITY): Payer: Self-pay | Admitting: *Deleted

## 2021-09-29 NOTE — Telephone Encounter (Addendum)
I called Deborah Coffey to find out how her glucose finger stick have been doing. I was not able to reach her mobile number due to the mailbox being full. I called her home phone and was able to leave a message there.I ask her to return my call. Deborah Coffey had her PR orientation appointment 09/04/1921 and she is yet to attend any exercise sessions. The day she came in to exercise 09/10/21 her blood sugar was 80 and we were unable to let her exercise. We have been trying to reach her and have left a message on her mobile number. Thus far have not received any return calls from her. If we soon do not receive a call from her we will plan to discharge her from the [facility] program.

## 2021-10-01 ENCOUNTER — Encounter (HOSPITAL_COMMUNITY): Payer: No Typology Code available for payment source

## 2021-10-01 ENCOUNTER — Ambulatory Visit (HOSPITAL_COMMUNITY): Payer: No Typology Code available for payment source

## 2021-10-06 ENCOUNTER — Encounter (HOSPITAL_COMMUNITY): Payer: Self-pay | Admitting: *Deleted

## 2021-10-06 ENCOUNTER — Encounter (HOSPITAL_COMMUNITY): Payer: No Typology Code available for payment source

## 2021-10-06 NOTE — Addendum Note (Signed)
Encounter addended by: Deon Pilling, RN on: 10/06/2021 9:34 AM  Actions taken: Clinical Note Signed, Episode resolved

## 2021-10-06 NOTE — Progress Notes (Signed)
We have called Deborah Coffey 6/18 and 09/29/21 and left messages for her to return our calls. We have not received any return calls from her. We will drop her from the PR program. I will also send her a letter in the mail.

## 2021-10-06 NOTE — Progress Notes (Signed)
Discharge Progress Report  Patient Details  Name: Deborah Coffey MRN: [id number] Date of Birth: January 27, 1956 Referring Provider:   April Coffey [facility] Walk Test from 09/04/2021 in [place]  Referring Provider [place]        Number of Visits: 0  Reason for Discharge:  Early Exit:  Deborah Coffey came the first day of her PR exercise 09/10/21 . Her glucose finger stick was 80 so we sent her home with instructions to call her PCP. She did not have a glucose meter and had not been checking her glucose. We had the dietician to discuss with her carb information and we strongly encouraged her to start checking her glucose. She stated that she has been having symptoms of low blood sugars.We encouraged her to inform her PCP of this and how often it is happening. She stated that she would call. We called her twice to follow up on her glucose checks. She did not return any of our calls. We discharged her from the PR program.I also mailed her a letter.  Smoking History:  Social History   Tobacco Use  Smoking Status Some Days   Packs/day: 0.50   Years: 42.00   Total pack years: 21.00   Types: Cigarettes  Smokeless Tobacco Never    Diagnosis:  Pulmonary hypertension ([place])  ADL [facility]:  Pulmonary Assessment Scores     Row Name 09/04/21 1321         ADL [facility]   ADL Phase Entry     SOB Score total 69       CAT Score   CAT Score 23       mMRC Score   mMRC Score 4              Initial Exercise Prescription:  Initial Exercise Prescription - 09/04/21 1400       Date of Initial Exercise RX and Referring Provider   Date 09/04/21    Referring Provider [email or web address]    Expected Discharge Date 11/12/21      NuStep   Level 1    SPM 60    Minutes 30      Prescription Details   Frequency (times per week) 2    Duration Progress to 30 minutes of continuous aerobic without signs/symptoms of physical distress      Intensity   THRR 40-80% of  Max Heartrate 62-125    Ratings of Perceived Exertion 11-13    Perceived Dyspnea 0-4      Progression   Progression Continue to progress workloads to maintain intensity without signs/symptoms of physical distress.      Resistance Training   Training Prescription Yes    Weight red bands    Reps 10-15             Discharge Exercise Prescription (Final Exercise Prescription Changes):   Functional Capacity:  6 Minute Walk     Row Name 09/04/21 1443         6 Minute Walk   Phase Initial     Distance 620 feet     Walk Time 6 minutes     # of Rest Breaks 2  1:20-2:20, 3:20-4:40     MPH 1.17     METS 1.6     RPE 13     Perceived Dyspnea  3     VO2 Peak 5.58     Symptoms No     Resting HR 71 bpm     Resting BP 130/64  Resting Oxygen Saturation  94 %     Exercise Oxygen Saturation  during 6 min walk 92 %     Max Ex. HR 114 bpm     Max Ex. BP 152/78     2 Minute Post BP 132/70       Interval HR   1 Minute HR 96     2 Minute HR 95     3 Minute HR 106     4 Minute HR 97     5 Minute HR 114     6 Minute HR 105     2 Minute Post HR 74     Interval Heart Rate? Yes       Interval Oxygen   Interval Oxygen? Yes     Baseline Oxygen Saturation % 94 %     1 Minute Oxygen Saturation % 94 %     1 Minute Liters of Oxygen 0 L     2 Minute Oxygen Saturation % 97 %     2 Minute Liters of Oxygen 0 L     3 Minute Oxygen Saturation % 92 %     3 Minute Liters of Oxygen 0 L     4 Minute Oxygen Saturation % 95 %     4 Minute Liters of Oxygen 0 L     5 Minute Oxygen Saturation % 92 %     5 Minute Liters of Oxygen 0 L     6 Minute Oxygen Saturation % 98 %     6 Minute Liters of Oxygen 0 L     2 Minute Post Oxygen Saturation % 96 %     2 Minute Post Liters of Oxygen 0 L              Psychological, QOL, Others - Outcomes: PHQ 2/9:    09/04/2021    1:00 PM 06/01/2021    3:07 PM 04/27/2021    1:43 PM 01/21/2021    4:02 PM 07/22/2020    1:54 PM  Depression screen PHQ 2/9   Decreased Interest 0 0 0 1 2  Down, Depressed, Hopeless 0 0 0 1 2  PHQ - 2 Score 0 0 0 2 4  Altered sleeping 0  0 1 1  Tired, decreased energy 0  0 1 1  Change in appetite 0  0 1 2  Feeling bad or failure about yourself  0  0 1 0  Trouble concentrating 0  0 0 1  Moving slowly or fidgety/restless 0  0 0 1  Suicidal thoughts 0  0 0 0  PHQ-9 Score 0  0 6 10  Difficult doing work/chores Not difficult at all        Quality of Life:   Personal Goals: Goals established at orientation with interventions provided to work toward goal.  Personal Goals and Risk Factors at Admission - 09/04/21 1305       Core Components/Risk Factors/Patient Goals on Admission    Weight Management Yes;Weight Loss    Improve shortness of breath with ADL's Yes    Intervention Provide education, individualized exercise plan and daily activity instruction to help decrease symptoms of SOB with activities of daily living.    Expected Outcomes Short Term: Improve cardiorespiratory fitness to achieve a reduction of symptoms when performing ADLs;Long Term: Be able to perform more ADLs without symptoms or delay the onset of symptoms    Stress Yes    Intervention Offer individual and/or small group  education and counseling on adjustment to heart disease, stress management and health-related lifestyle change. Teach and support self-help strategies.;Refer participants experiencing significant psychosocial distress to appropriate mental health specialists for further evaluation and treatment. When possible, include family members and significant others in education/counseling sessions.    Expected Outcomes Short Term: Participant demonstrates changes in health-related behavior, relaxation and other stress management skills, ability to obtain effective social support, and compliance with psychotropic medications if prescribed.;Long Term: Emotional wellbeing is indicated by absence of clinically significant psychosocial distress or  social isolation.              Personal Goals Discharge:  Goals and Risk Factor Review     Row Name 09/09/21 0957             Core Components/Risk Factors/Patient Goals Review   Personal Goals Review Weight Management/Obesity;Improve shortness of breath with ADL's;Increase knowledge of respiratory medications and ability to use respiratory devices properly.;Develop more efficient breathing techniques such as purse lipped breathing and diaphragmatic breathing and practicing self-pacing with activity.       Review [name] is starting the PR program 09/10/21 will continue to monitor core components.       Expected Outcomes See admission goals.                Exercise Goals and Review:  Exercise Goals     Row Name 09/04/21 1310 09/08/21 0913           Exercise Goals   Increase Physical Activity Yes Yes      Intervention Provide advice, education, support and counseling about physical activity/exercise needs.;Develop an individualized exercise prescription for aerobic and resistive training based on initial evaluation findings, risk stratification, comorbidities and participant's personal goals. Provide advice, education, support and counseling about physical activity/exercise needs.;Develop an individualized exercise prescription for aerobic and resistive training based on initial evaluation findings, risk stratification, comorbidities and participant's personal goals.      Expected Outcomes Short Term: Attend rehab on a regular basis to increase amount of physical activity.;Long Term: Add in home exercise to make exercise part of routine and to increase amount of physical activity.;Long Term: Exercising regularly at least 3-5 days a week. Short Term: Attend rehab on a regular basis to increase amount of physical activity.;Long Term: Add in home exercise to make exercise part of routine and to increase amount of physical activity.;Long Term: Exercising regularly at least 3-5 days a  week.      Increase Strength and Stamina Yes Yes      Intervention Provide advice, education, support and counseling about physical activity/exercise needs.;Develop an individualized exercise prescription for aerobic and resistive training based on initial evaluation findings, risk stratification, comorbidities and participant's personal goals. Provide advice, education, support and counseling about physical activity/exercise needs.;Develop an individualized exercise prescription for aerobic and resistive training based on initial evaluation findings, risk stratification, comorbidities and participant's personal goals.      Expected Outcomes Short Term: Increase workloads from initial exercise prescription for resistance, speed, and METs.;Short Term: Perform resistance training exercises routinely during rehab and add in resistance training at home;Long Term: Improve cardiorespiratory fitness, muscular endurance and strength as measured by increased METs and functional capacity (6MWT) Short Term: Increase workloads from initial exercise prescription for resistance, speed, and METs.;Short Term: Perform resistance training exercises routinely during rehab and add in resistance training at home;Long Term: Improve cardiorespiratory fitness, muscular endurance and strength as measured by increased METs and functional capacity (6MWT)  Able to understand and use rate of perceived exertion (RPE) scale Yes Yes      Intervention Provide education and explanation on how to use RPE scale Provide education and explanation on how to use RPE scale      Expected Outcomes Short Term: Able to use RPE daily in rehab to express subjective intensity level;Long Term:  Able to use RPE to guide intensity level when exercising independently Short Term: Able to use RPE daily in rehab to express subjective intensity level;Long Term:  Able to use RPE to guide intensity level when exercising independently      Able to understand and  use Dyspnea scale Yes Yes      Intervention Provide education and explanation on how to use Dyspnea scale Provide education and explanation on how to use Dyspnea scale      Expected Outcomes Short Term: Able to use Dyspnea scale daily in rehab to express subjective sense of shortness of breath during exertion;Long Term: Able to use Dyspnea scale to guide intensity level when exercising independently Short Term: Able to use Dyspnea scale daily in rehab to express subjective sense of shortness of breath during exertion;Long Term: Able to use Dyspnea scale to guide intensity level when exercising independently      Knowledge and understanding of Target Heart Rate Range (THRR) Yes Yes      Intervention Provide education and explanation of THRR including how the numbers were predicted and where they are located for reference Provide education and explanation of THRR including how the numbers were predicted and where they are located for reference      Expected Outcomes Short Term: Able to state/look up THRR;Long Term: Able to use THRR to govern intensity when exercising independently;Short Term: Able to use daily as guideline for intensity in rehab Short Term: Able to state/look up THRR;Long Term: Able to use THRR to govern intensity when exercising independently;Short Term: Able to use daily as guideline for intensity in rehab      Understanding of Exercise Prescription Yes Yes      Intervention Provide education, explanation, and written materials on patient's individual exercise prescription Provide education, explanation, and written materials on patient's individual exercise prescription      Expected Outcomes Short Term: Able to explain program exercise prescription;Long Term: Able to explain home exercise prescription to exercise independently Short Term: Able to explain program exercise prescription;Long Term: Able to explain home exercise prescription to exercise independently                Exercise Goals Re-Evaluation:  Exercise Goals Re-Evaluation     [place] Name 09/08/21 0913             Exercise Goal Re-Evaluation   Exercise Goals Review Increase Physical Activity;Increase Strength and Stamina;Able to understand and use rate of perceived exertion (RPE) scale;Able to understand and use Dyspnea scale;Knowledge and understanding of Target Heart Rate Range (THRR);Understanding of Exercise Prescription       Comments Pt is scheduled to begin exercise this week. Will continue to monitor and progress as able.       Expected Outcomes Through exercise at rehab and home, the patient will decrease shortness of breath with daily activities and feel confident in carrying out an exercise regimen at home.                Nutrition & Weight - Outcomes:  Pre Biometrics - 09/04/21 1241       Pre Biometrics   Grip Strength  18 kg              Nutrition:  Nutrition Therapy & Goals - 09/10/21 1223       Nutrition Therapy   Diet Heart Healthy/ Carbohydrate Consistent    Drug/Food Interactions Statins/Certain Fruits      Personal Nutrition Goals   Nutrition Goal Patient to implement whole grains, fruits/vegetables, lean protein/plant protein, and low fat dairy as part of heart healthy diet    Personal Goal #2 Patient to identify and limit food sources of saturated fat, trans fat, sodium, and refined carbohydrates      Intervention Plan   Intervention Prescribe, educate and counsel regarding individualized specific dietary modifications aiming towards targeted core components such as weight, hypertension, lipid management, diabetes, heart failure and other comorbidities.    Expected Outcomes Short Term Goal: Understand basic principles of dietary content, such as calories, fat, sodium, cholesterol and nutrients.;Short Term Goal: A plan has been developed with personal nutrition goals set during dietitian appointment.;Long Term Goal: Adherence to prescribed nutrition plan.              Nutrition Discharge:   Education Questionnaire Score:  Knowledge Questionnaire Score - 09/04/21 1314       Knowledge Questionnaire Score   Pre Score 15/18             Goals reviewed with patient; copy given to patient.

## 2021-10-08 ENCOUNTER — Encounter (HOSPITAL_COMMUNITY): Payer: No Typology Code available for payment source

## 2021-10-13 ENCOUNTER — Encounter (HOSPITAL_COMMUNITY): Payer: No Typology Code available for payment source

## 2021-10-15 ENCOUNTER — Encounter (HOSPITAL_COMMUNITY): Payer: No Typology Code available for payment source

## 2021-10-22 ENCOUNTER — Encounter (HOSPITAL_COMMUNITY): Payer: No Typology Code available for payment source

## 2021-10-27 ENCOUNTER — Encounter (HOSPITAL_COMMUNITY): Payer: No Typology Code available for payment source

## 2021-10-29 ENCOUNTER — Encounter (HOSPITAL_COMMUNITY): Payer: No Typology Code available for payment source

## 2021-11-02 ENCOUNTER — Other Ambulatory Visit: Payer: Self-pay

## 2021-11-03 ENCOUNTER — Encounter (HOSPITAL_COMMUNITY): Payer: No Typology Code available for payment source

## 2021-11-05 ENCOUNTER — Encounter (HOSPITAL_COMMUNITY): Payer: No Typology Code available for payment source

## 2021-11-10 ENCOUNTER — Encounter (HOSPITAL_COMMUNITY): Payer: No Typology Code available for payment source

## 2021-11-12 ENCOUNTER — Encounter (HOSPITAL_COMMUNITY): Payer: No Typology Code available for payment source

## 2021-11-30 ENCOUNTER — Ambulatory Visit: Payer: No Typology Code available for payment source | Admitting: Family Medicine

## 2021-12-08 ENCOUNTER — Other Ambulatory Visit: Payer: Self-pay

## 2021-12-08 ENCOUNTER — Other Ambulatory Visit: Payer: Self-pay | Admitting: Family Medicine

## 2021-12-09 ENCOUNTER — Other Ambulatory Visit: Payer: Self-pay

## 2021-12-09 ENCOUNTER — Other Ambulatory Visit: Payer: Self-pay | Admitting: Family Medicine

## 2021-12-09 DIAGNOSIS — L409 Psoriasis, unspecified: Secondary | ICD-10-CM

## 2021-12-09 MED ORDER — FUROSEMIDE 20 MG PO TABS
40.0000 mg | ORAL_TABLET | Freq: Every day | ORAL | 0 refills | Status: DC
  Filled 2021-12-09: qty 60, 30d supply, fill #0

## 2021-12-09 MED ORDER — CLOBETASOL PROPIONATE 0.05 % EX CREA
TOPICAL_CREAM | Freq: Two times a day (BID) | CUTANEOUS | 0 refills | Status: DC
  Filled 2021-12-09: qty 60, 30d supply, fill #0

## 2021-12-18 ENCOUNTER — Emergency Department (HOSPITAL_COMMUNITY)
Admission: EM | Admit: 2021-12-18 | Discharge: 2021-12-18 | Disposition: A | Discharge status: Home / Self Care | Payer: No Typology Code available for payment source | Attending: Emergency Medicine | Admitting: Emergency Medicine

## 2021-12-18 ENCOUNTER — Emergency Department (HOSPITAL_BASED_OUTPATIENT_CLINIC_OR_DEPARTMENT_OTHER): Payer: No Typology Code available for payment source

## 2021-12-18 ENCOUNTER — Other Ambulatory Visit: Payer: Self-pay

## 2021-12-18 ENCOUNTER — Encounter (HOSPITAL_COMMUNITY): Payer: Self-pay | Admitting: Emergency Medicine

## 2021-12-18 ENCOUNTER — Ambulatory Visit (HOSPITAL_COMMUNITY)
Admission: EM | Admit: 2021-12-18 | Discharge: 2021-12-18 | Disposition: A | Discharge status: Home / Self Care | Payer: No Typology Code available for payment source

## 2021-12-18 ENCOUNTER — Emergency Department (HOSPITAL_COMMUNITY): Payer: No Typology Code available for payment source

## 2021-12-18 DIAGNOSIS — Z7984 Long term (current) use of oral hypoglycemic drugs: Secondary | ICD-10-CM | POA: Diagnosis not present

## 2021-12-18 DIAGNOSIS — M79605 Pain in left leg: Secondary | ICD-10-CM | POA: Diagnosis not present

## 2021-12-18 DIAGNOSIS — I10 Essential (primary) hypertension: Secondary | ICD-10-CM | POA: Diagnosis not present

## 2021-12-18 DIAGNOSIS — Z79899 Other long term (current) drug therapy: Secondary | ICD-10-CM | POA: Diagnosis not present

## 2021-12-18 DIAGNOSIS — M7989 Other specified soft tissue disorders: Secondary | ICD-10-CM

## 2021-12-18 DIAGNOSIS — M79609 Pain in unspecified limb: Secondary | ICD-10-CM | POA: Diagnosis not present

## 2021-12-18 DIAGNOSIS — I2510 Atherosclerotic heart disease of native coronary artery without angina pectoris: Secondary | ICD-10-CM | POA: Insufficient documentation

## 2021-12-18 DIAGNOSIS — L03116 Cellulitis of left lower limb: Secondary | ICD-10-CM | POA: Diagnosis not present

## 2021-12-18 DIAGNOSIS — Z794 Long term (current) use of insulin: Secondary | ICD-10-CM | POA: Diagnosis not present

## 2021-12-18 DIAGNOSIS — R7989 Other specified abnormal findings of blood chemistry: Secondary | ICD-10-CM | POA: Insufficient documentation

## 2021-12-18 DIAGNOSIS — E119 Type 2 diabetes mellitus without complications: Secondary | ICD-10-CM | POA: Insufficient documentation

## 2021-12-18 DIAGNOSIS — Z7982 Long term (current) use of aspirin: Secondary | ICD-10-CM | POA: Diagnosis not present

## 2021-12-18 DIAGNOSIS — R079 Chest pain, unspecified: Secondary | ICD-10-CM | POA: Diagnosis not present

## 2021-12-18 DIAGNOSIS — M79672 Pain in left foot: Secondary | ICD-10-CM

## 2021-12-18 LAB — BRAIN NATRIURETIC PEPTIDE: B Natriuretic Peptide: 1545.2 pg/mL — ABNORMAL HIGH (ref 0.0–100.0)

## 2021-12-18 LAB — BASIC METABOLIC PANEL
Anion gap: 8 (ref 5–15)
BUN: 28 mg/dL — ABNORMAL HIGH (ref 8–23)
CO2: 25 mmol/L (ref 22–32)
Calcium: 9.3 mg/dL (ref 8.9–10.3)
Chloride: 108 mmol/L (ref 98–111)
Creatinine, Ser: 1.43 mg/dL — ABNORMAL HIGH (ref 0.44–1.00)
GFR, Estimated: 41 mL/min — ABNORMAL LOW (ref >60)
Glucose, Bld: 133 mg/dL — ABNORMAL HIGH (ref 70–99)
Potassium: 4.6 mmol/L (ref 3.5–5.1)
Sodium: 141 mmol/L (ref 135–145)

## 2021-12-18 LAB — CBC WITH DIFFERENTIAL/PLATELET
Abs Immature Granulocytes: 0.04 10*3/uL (ref 0.00–0.07)
Basophils Absolute: 0 10*3/uL (ref 0.0–0.1)
Basophils Relative: 1 %
Eosinophils Absolute: 0.1 10*3/uL (ref 0.0–0.5)
Eosinophils Relative: 1 %
HCT: 35.5 % — ABNORMAL LOW (ref 36.0–46.0)
Hemoglobin: 10.9 g/dL — ABNORMAL LOW (ref 12.0–15.0)
Immature Granulocytes: 1 %
Lymphocytes Relative: 18 %
Lymphs Abs: 1.5 10*3/uL (ref 0.7–4.0)
MCH: 27.4 pg (ref 26.0–34.0)
MCHC: 30.7 g/dL (ref 30.0–36.0)
MCV: 89.2 fL (ref 80.0–100.0)
Monocytes Absolute: 0.7 10*3/uL (ref 0.1–1.0)
Monocytes Relative: 8 %
Neutro Abs: 6.2 10*3/uL (ref 1.7–7.7)
Neutrophils Relative %: 71 %
Platelets: 266 10*3/uL (ref 150–400)
RBC: 3.98 MIL/uL (ref 3.87–5.11)
RDW: 16.9 % — ABNORMAL HIGH (ref 11.5–15.5)
WBC: 8.6 10*3/uL (ref 4.0–10.5)
nRBC: 0 % (ref 0.0–0.2)

## 2021-12-18 MED ORDER — DOXYCYCLINE HYCLATE 100 MG PO CAPS: 100.0000 mg | ORAL_CAPSULE | Freq: Two times a day (BID) | ORAL | 0 refills | Status: DC

## 2021-12-18 MED ORDER — TRAMADOL HCL 50 MG PO TABS: 50.0000 mg | ORAL_TABLET | Freq: Four times a day (QID) | ORAL | 0 refills | Status: DC | PRN

## 2021-12-18 MED ORDER — DOXYCYCLINE HYCLATE 100 MG PO TABS
100.0000 mg | ORAL_TABLET | Freq: Once | ORAL | Status: AC
  Administered 2021-12-18: 100 mg via ORAL
  Filled 2021-12-18: qty 1

## 2021-12-18 MED ORDER — TRAMADOL HCL 50 MG PO TABS
50.0000 mg | ORAL_TABLET | Freq: Once | ORAL | Status: AC
  Administered 2021-12-18: 50 mg via ORAL
  Filled 2021-12-18: qty 1

## 2021-12-18 NOTE — ED Provider Triage Note (Signed)
Emergency Medicine Provider Triage Evaluation Note  Mairen Wallenstein , a 66 y.o. female  was evaluated in triage.  Pt complains of swelling x 3 days.Seen at [facility] today, sent here for DVT study. Wearing a supportive boot for comfort. No Mat. No fever.  Review of Systems  Positive: Leg swelling,  Negative: Sob,cp  Physical Exam  BP 134/65 (BP Location: Right Arm)   Pulse 88   Temp 98.4 F (36.9 C) (Oral)   Resp 15   LMP  (LMP Unknown)   SpO2 98%  Gen:   Awake, no distress   Resp:  Normal effort  MSK:   Moves extremities without difficulty  Other:  2+ pitting edema to the left leg, with some erythema  Medical Decision Making  Medically screening exam initiated at 12:07 PM.  Appropriate orders placed.  [name] was informed that the remainder of the evaluation will be completed by another provider, this initial triage assessment does not replace that evaluation, and the importance of remaining in the ED until their evaluation is complete.  Labs and [place] ordered.    Janeece Fitting, PA-C 12/18/21 1209

## 2021-12-18 NOTE — ED Provider Notes (Signed)
[facility] Provider Note   CSN: [id number] Arrival date & time: 12/18/21  1113     History  Chief Complaint  Patient presents with   Leg Pain   Foot Pain    Deborah Coffey is a 66 y.o. female with a past medical history of type 2 diabetes, hypertension, CAD and pulmonary hypertension presenting today due to left foot swelling.  Reports that she woke up on Monday and could barely walk and was experiencing severe pain that was refractory to Tylenol.  Saw urgent care today who was concerned for DVT and she was sent here.  No recent travel or surgery.  No history of DVT.  No shortness of breath or hemoptysis.  She questions whether or not a spider may have bit her.  She occasionally sees spiders and ants around her house.   Leg Pain Foot Pain       Home Medications Prior to Admission medications   Medication Sig Start Date End Date Taking? Authorizing Provider  doxycycline (VIBRAMYCIN) 100 MG capsule Take 1 capsule (100 mg total) by mouth 2 (two) times daily. 12/18/21  Yes Redwine, Madison A, PA-C  traMADol (ULTRAM) 50 MG tablet Take 1 tablet (50 mg total) by mouth every 6 (six) hours as needed. 12/18/21  Yes Redwine, Madison A, PA-C  acetaminophen (TYLENOL) 500 MG tablet Take 1,000 mg by mouth every 6 (six) hours as needed for headache (pain).     [provider]  Alcohol Swabs (B-D SINGLE USE SWABS REGULAR) PADS USE AS DIRECTED 04/13/17   Tresa Garter, MD  aspirin EC 81 MG tablet Take 81 mg by mouth daily.    [provider]  atorvastatin (LIPITOR) 10 MG tablet Take 1 tablet (10 mg total) by mouth once daily. 09/07/21   Charlott Rakes, MD  Blood Glucose Monitoring Suppl (ONETOUCH VERIO REFLECT) w/Device KIT Use as instructed to test blood glucose 3 times daily. 09/15/21   Charlott Rakes, MD  clobetasol cream (TEMOVATE) 0.05 % Apply topically 2 (two) times daily. 12/09/21   Charlott Rakes, MD  Continuous Blood Gluc  Sensor (DEXCOM G6 SENSOR) MISC Use to check blood sugar three times daily. Dx. E11.59 04/29/21   Charlott Rakes, MD  cyclobenzaprine (FLEXERIL) 10 MG tablet Take 1 tablet (10 mg total) by mouth 2 (two) times daily as needed for muscle spasms. 09/19/19   Domenic Moras, PA-C  dapagliflozin propanediol (FARXIGA) 5 MG TABS tablet Take 1 tablet (5 mg total) by mouth daily before breakfast. Patient not taking: Reported on 09/04/2021 01/21/21   Argentina Donovan, PA-C  furosemide (LASIX) 20 MG tablet Take 2 tablets (40 mg total) by mouth daily. 12/09/21   Charlott Rakes, MD  gabapentin (NEURONTIN) 300 MG capsule Take orally 2 caps (600 mg) in the morning and 3 capsules (900 mg) in the evening 01/21/21   Argentina Donovan, PA-C  glucose blood (ONETOUCH VERIO) test strip Use as instructed to test blood glucose 3 times daily. 09/15/21   Charlott Rakes, MD  insulin glargine, 2 Unit Dial, (TOUJEO MAX SOLOSTAR) 300 UNIT/ML Solostar Pen Inject 55 Units into the skin daily. 04/27/21   Charlott Rakes, MD  Insulin Pen Needle (DROPLET PEN NEEDLES) 31G X 8 MM MISC USE WITH TOUJEO SOLOSTAR PEN EVERY DAY 01/21/21   McClung, Levada Dy M, PA-C  Lancets [facility] DELICA PLUS [id number]) MISC Use as instructed to test blood glucose 3 times daily. 09/15/21   Charlott Rakes, MD  lisinopril (ZESTRIL) 20 MG tablet  Take 0.5 tablets (10 mg total) by mouth daily. 04/27/21   Charlott Rakes, MD  metFORMIN (GLUCOPHAGE-XR) 500 MG 24 hr tablet Take 2 tablets (1,000 mg total) by mouth 2 (two) times daily. 04/18/21   Jettie Booze, MD  metoprolol tartrate (LOPRESSOR) 50 MG tablet Take 1 tablet (50 mg total) by mouth 2 (two) times daily. 08/31/21   Charlott Rakes, MD  Multiple Vitamin (MULTIVITAMIN WITH MINERALS) TABS tablet Take 1 tablet by mouth daily.    [provider]  nitroGLYCERIN (NITROSTAT) 0.4 MG SL tablet Place 1 tablet (0.4 mg total) under the tongue every 5 (five) minutes as needed for chest pain. 03/22/17   Isaiah Serge, NP   pantoprazole (PROTONIX) 40 MG tablet Take 1 tablet (40 mg total) by mouth 2 (two) times daily. 06/11/21   Charlott Rakes, MD  Semaglutide,0.25 or 0.5MG/DOS, (OZEMPIC, 0.25 OR 0.5 MG/DOSE,) 2 MG/3ML SOPN Inject 0.25 mg into the skin once a week. 08/31/21   Charlott Rakes, MD      Allergies    Ciprofloxacin    Review of Systems   Review of Systems  Physical Exam Updated Vital Signs BP 134/65 (BP Location: Right Arm)   Pulse 88   Temp 98.4 F (36.9 C) (Oral)   Resp 15   LMP  (LMP Unknown)   SpO2 98%  Physical Exam Vitals and nursing note reviewed.  Constitutional:      Appearance: Normal appearance.  HENT:     Head: Normocephalic and atraumatic.  Eyes:     General: No scleral icterus.    Conjunctiva/sclera: Conjunctivae normal.  Pulmonary:     Effort: Pulmonary effort is normal. No respiratory distress.  Musculoskeletal:     Comments: Warmth and swelling to the foot.  Nonpitting edema.  Dopplerable DP pulse.  Full range of motion of MTPs and ankle.  Skin:    Findings: No rash.  Neurological:     Mental Status: She is alert.  Psychiatric:        Mood and Affect: Mood normal.     ED Results / Procedures / Treatments   Labs (all labs ordered are listed, but only abnormal results are displayed) Labs Reviewed  CBC WITH DIFFERENTIAL/PLATELET - Abnormal; Notable for the following components:      Result Value   Hemoglobin 10.9 (*)    HCT 35.5 (*)    RDW 16.9 (*)    All other components within normal limits  BASIC METABOLIC PANEL - Abnormal; Notable for the following components:   Glucose, Bld 133 (*)    BUN 28 (*)    Creatinine, Ser 1.43 (*)    GFR, Estimated 41 (*)    All other components within normal limits  BRAIN NATRIURETIC PEPTIDE - Abnormal; Notable for the following components:   B Natriuretic Peptide 1,545.2 (*)    All other components within normal limits    EKG EKG Interpretation  Date/Time:  Friday December 18 2021 15:04:41 EDT Ventricular Rate:   73 PR Interval:  172 QRS Duration: 74 QT Interval:  396 QTC Calculation: 436 R Axis:   91 Text Interpretation: Normal sinus rhythm with sinus arrhythmia Rightward axis Nonspecific T wave abnormality Abnormal ECG When compared with ECG of 19-May-2021 14:45, PREVIOUS ECG IS PRESENT no significant change from previous Confirmed by Charlesetta Shanks [phone]) on 12/18/2021 3:07:53 PM  Radiology VAS [place] LOWER EXTREMITY VENOUS (DVT) (ONLY [facility] & [facility])  Result Date: 12/18/2021  Lower Venous DVT Study Patient Name:  ROBERTHA STAPLES  Date of Exam:   12/18/2021 Medical Rec #: [id number]           Accession #:    [id number] Date of Birth: 10-Oct-1955          Patient Gender: F Patient Age:   74 years Exam Location:  [facility] Procedure:      VAS [place] LOWER EXTREMITY VENOUS (DVT) Referring Phys: [facility] PFEIFFER --------------------------------------------------------------------------------  Indications: Pain and swelling left leg.  Risk Factors: CHF. Comparison Study: No prior studies. Performing Technologist: Darlin Coco RDMS, RVT  Examination Guidelines: A complete evaluation includes B-mode imaging, spectral Doppler, color Doppler, and power Doppler as needed of all accessible portions of each vessel. Bilateral testing is considered an integral part of a complete examination. Limited examinations for reoccurring indications may be performed as noted. The reflux portion of the exam is performed with the patient in reverse Trendelenburg.  +---------+---------------+---------+-----------+----------+--------------+ LEFT     CompressibilityPhasicitySpontaneityPropertiesThrombus Aging +---------+---------------+---------+-----------+----------+--------------+ CFV      Full           Yes      Yes                                 +---------+---------------+---------+-----------+----------+--------------+ SFJ      Full                                                         +---------+---------------+---------+-----------+----------+--------------+ FV Prox  Full                                                        +---------+---------------+---------+-----------+----------+--------------+ FV Mid   Full                                                        +---------+---------------+---------+-----------+----------+--------------+ FV DistalFull                                                        +---------+---------------+---------+-----------+----------+--------------+ PFV      Full                                                        +---------+---------------+---------+-----------+----------+--------------+ POP      Full           Yes      Yes                                 +---------+---------------+---------+-----------+----------+--------------+ PTV      Full                                                        +---------+---------------+---------+-----------+----------+--------------+  PERO     Full                                                        +---------+---------------+---------+-----------+----------+--------------+ Gastroc  Full                                                        +---------+---------------+---------+-----------+----------+--------------+     Summary: LEFT: - There is no evidence of deep vein thrombosis in the lower extremity.  - No cystic structure found in the popliteal fossa.  *See table(s) above for measurements and observations. Electronically signed by Servando Snare MD on 12/18/2021 at 3:07:58 PM.    Final    DG Chest 2 View  Result Date: 12/18/2021 CLINICAL DATA:  Pain.  Left foot/leg pain. EXAM: CHEST - 2 VIEW COMPARISON:  Chest radiograph 02/27/2021 and chest CT 09/02/2021 FINDINGS: Heart size is upper limits of normal but stable. Previous median sternotomy. Prior CT demonstrated small nodular densities that would be difficult to appreciate on this examination. No large  areas of airspace disease or lung consolidation. No overt pulmonary edema. No large pleural effusions. Degenerative changes in lower thoracic spine. IMPRESSION: 1. No acute cardiopulmonary disease. 2. Limited evaluation for small nodules as described. Electronically Signed   By: Markus Daft M.D.   On: 12/18/2021 14:48    Procedures Procedures   Medications Ordered in ED Medications  doxycycline (VIBRA-TABS) tablet 100 mg (100 mg Oral Given 12/18/21 1518)  traMADol (ULTRAM) tablet 50 mg (50 mg Oral Given 12/18/21 1518)    ED Course/ Medical Decision Making/ A&P                           Medical Decision Making Amount and/or Complexity of Data Reviewed Radiology: ordered.  Risk Prescription drug management.   This is a 66 year old female who presents to the ED for concern of left foot pain and swelling.  This started acutely.  Differential includes but is not limited to DVT, PAD, heart failure, cellulitis, gout, medication reaction.   This is not an exhaustive differential.    Past Medical History / Co-morbidities / Social History: Diabetes, hypertension   Additional history: Reviewed patient's urgent care chart.  They were unable to palpate pulses however patient has a dopplerable pulse.  They sent her for DVT study.   Physical Exam: Pertinent physical exam findings include Swelling and warmth to left lower extremity.  Some streaking up the medial ankle  Lab Tests: Labs are ordered in triage.  This includes a BNP.  This was reviewed and interpreted by me.  Pertinent results include BNP 1,545.  No history of heart failure.   Imaging Studies: I ordered and independently visualized and interpreted DVT study and I agree with the radiologist that there are no signs of DVT   Medications: I ordered medication including doxycycline and tramadol. Reevaluation of the patient after these medicines showed that the patient improved. I have reviewed the patients home medicines and have made  adjustments as needed.   MDM/Disposition: This is a 66 year old woman coming in with left foot pain.  Sent from urgent care  due to concern for DVT.  No risk factors for DVT.  DVT study was negative.  BNP was elevated however I doubt this is the cause of patient's foot and leg swelling as it is unilateral.  She will follow-up with her cardiologist about this.  EKG and chest x-ray were unremarkable for cardiac concerns.  I considered gout as a diagnosis however patient does have some degree of cellulitis as well as erythema surrounding an area where I believe she may have been by a bug.  She will be treated with doxycycline as she is concerned it could be a spider because she sees them in her home.  She is hemodynamically stable, neurovascularly intact and may be discharged at this time.   I discussed this case with my attending physician Dr. Vallery Ridge who cosigned this note including patient's presenting symptoms, physical exam, and planned diagnostics and interventions. Attending physician stated agreement with plan or made changes to plan which were implemented.     Final Clinical Impression(s) / ED Diagnoses Final diagnoses:  Cellulitis of left lower extremity    Rx / DC Orders ED Discharge Orders          Ordered    doxycycline (VIBRAMYCIN) 100 MG capsule  2 times daily        12/18/21 1345    traMADol (ULTRAM) 50 MG tablet  Every 6 hours PRN        12/18/21 1346           Results and diagnoses were explained to the patient. Return precautions discussed in full. Patient had no additional questions and expressed complete understanding.   This chart was dictated using voice recognition software.  Despite best efforts to proofread,  errors can occur which can change the documentation meaning.    Rhae Hammock, PA-C 12/18/21 [place], MD 12/20/21 [phone]

## 2021-12-18 NOTE — Discharge Instructions (Signed)
Please go to the nearest emergency department to rule out possible blood clot to your left foot/calf.

## 2021-12-18 NOTE — ED Provider Notes (Signed)
[facility]    CSN: [id number] Arrival date & time: 12/18/21  0944      History   Chief Complaint Chief Complaint  Patient presents with   Foot Swelling   Foot Pain    HPI Deborah Coffey is a 66 y.o. female.   Patient presents to urgent care for evaluation of unilateral left leg swelling and foot swelling that started upon waking on Monday, December 14, 2021.  States that the swelling has become worse to the point where she is unable to bear weight on the left foot.  Patient has been wearing a cam walker boot to the left foot that she had leftover from a previous injury to the right toe and states that this has not been helping with her pain or swelling.  Denies recent surgeries, long travel, chest pain, shortness of breath, fever/chills, headache, URI symptoms, injury/trauma to the left foot, falls, and dizziness.  She has a significant past medical history of type 2 diabetes, CKD stage III, pulmonary hypertension, CAD, TIA, and hypertension.  She does not use any blood thinning medications but states that she takes a baby aspirin once per day for her CAD.  She has never been diagnosed with gout in the past but was googling her symptoms and believes that this may be the cause of her left foot pain and swelling.  No history of DVT or clot to the legs or lungs.  The left foot is red, swollen, and warm to the touch.  Denies any symptoms to the right foot/calf.  She is currently sitting in a wheelchair in the exam room and is normally able to walk with a steady gait at baseline.  Denies previous injury to the left foot.  She has attempted use of Tylenol over-the-counter prior to arrival urgent care intermittently throughout the week for the pain without relief.  Pain to the left foot is currently a 10 on a scale of 0-10.   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 competitive flow   TIA (transient ischemic attack)    Type II diabetes mellitus ([place])     Patient Active Problem List   Diagnosis Date Noted   Obstructive sleep apnea 08/31/2021   Other secondary pulmonary hypertension ([place]) 04/10/2021   Facial numbness 09/03/2016   Chronic diastolic CHF (congestive heart failure) ([place]) 09/03/2016   Anxiety    Eczema 01/29/2016   History of adenomatous polyp of colon 10/01/2015   Floaters in visual field 09/04/2015   SOB (shortness of breath) 06/26/2015   Screening for colon cancer 06/26/2015   Type 2 diabetes mellitus with complication, with long-term current use of insulin ([place]) 02/27/2015   Diabetic mononeuropathy associated with type 2 diabetes mellitus ([place]) 02/27/2015   LLQ abdominal pain    CAP (community acquired pneumonia) 08/07/2014   AKI (acute kidney  injury) ([place]) 08/07/2014   Type 2 diabetes mellitus with hyperglycemia ([place]) 07/08/2014   Preventative health care 07/08/2014   Depression 07/08/2014   Acute upper respiratory infection 07/08/2014   Essential hypertension 01/10/2014   Depression (emotion) 01/10/2014   H/O TIA (transient ischemic attack) and stroke 01/10/2014   Visual changes 01/02/2014   Facial weakness 01/02/2014   Conversion disorder 01/01/2014   Tobacco use 06/20/2013   Diabetic retinopathy ([place]) 04/03/2013   Essential hypertension, benign 02/14/2013   Accelerated hypertension 09/19/2012   Uncontrolled diabetes mellitus 09/19/2012   Dyslipidemia 09/19/2012   CAD (coronary artery disease) - CABG x 1 (LIMA-LAD) 02/21/2004     Past Surgical History:  Procedure Laterality Date   CARDIAC CATHETERIZATION  2010   Previous LAD 95% lesion - now ~30-40%; patent LIMA with competitive flow   CESAREAN SECTION  1977; [place]  ~ 2012   CORONARY ARTERY BYPASS GRAFT  02/2004   "CABG X1" (03/22/2013); LIMA-LAD   RIGHT/LEFT HEART CATH AND CORONARY/GRAFT ANGIOGRAPHY N/A 04/16/2021   Procedure: RIGHT/LEFT HEART CATH AND CORONARY/GRAFT ANGIOGRAPHY;  Surgeon: Jettie Booze, MD;  Location: [facility];  Service: Cardiovascular;  Laterality: N/A;   TUBAL LIGATION  1989    OB History   No obstetric history on file.      Home Medications    Prior to Admission medications   Medication Sig Start Date End Date Taking? Authorizing Provider  acetaminophen (TYLENOL) 500 MG tablet Take 1,000 mg by mouth every 6 (six) hours as needed for headache (pain).     [provider]  Alcohol Swabs (B-D SINGLE USE SWABS REGULAR) PADS USE AS DIRECTED 04/13/17   Tresa Garter, MD  aspirin EC 81 MG tablet Take 81 mg by mouth daily.    [provider]  atorvastatin (LIPITOR) 10 MG tablet Take 1 tablet (10 mg total) by mouth once daily. 09/07/21   Charlott Rakes, MD  Blood Glucose Monitoring Suppl (ONETOUCH VERIO REFLECT) w/Device KIT Use as instructed to test blood glucose 3 times daily. 09/15/21   Charlott Rakes, MD  clobetasol cream (TEMOVATE) 0.05 % Apply topically 2 (two) times daily. 12/09/21   Charlott Rakes, MD  Continuous Blood Gluc Sensor (DEXCOM G6 SENSOR) MISC Use to check blood sugar three times daily. Dx. E11.59 04/29/21   Charlott Rakes, MD  cyclobenzaprine (FLEXERIL) 10 MG tablet Take 1 tablet (10 mg total) by mouth 2 (two) times daily as needed for muscle spasms. 09/19/19   Domenic Moras, PA-C  dapagliflozin propanediol (FARXIGA) 5 MG TABS tablet Take 1 tablet (5 mg total) by mouth daily before breakfast. Patient not taking: Reported on 09/04/2021 01/21/21   Argentina Donovan, PA-C   furosemide (LASIX) 20 MG tablet Take 2 tablets (40 mg total) by mouth daily. 12/09/21   Charlott Rakes, MD  gabapentin (NEURONTIN) 300 MG capsule Take orally 2 caps (600 mg) in the morning and 3 capsules (900 mg) in the evening 01/21/21   Argentina Donovan, PA-C  glucose blood (ONETOUCH VERIO) test strip Use as instructed to test blood glucose 3 times daily. 09/15/21   Charlott Rakes, MD  insulin glargine, 2 Unit Dial, (TOUJEO MAX SOLOSTAR) 300 UNIT/ML Solostar Pen Inject 55 Units into the skin daily. 04/27/21   Charlott Rakes, MD  Insulin Pen Needle (DROPLET PEN NEEDLES) 31G X 8 MM MISC USE WITH TOUJEO SOLOSTAR PEN EVERY DAY 01/21/21   McClung, Levada Dy M, PA-C  Lancets [facility] DELICA PLUS [id number]) MISC Use as instructed  to test blood glucose 3 times daily. 09/15/21   Charlott Rakes, MD  lisinopril (ZESTRIL) 20 MG tablet Take 0.5 tablets (10 mg total) by mouth daily. 04/27/21   Charlott Rakes, MD  metFORMIN (GLUCOPHAGE-XR) 500 MG 24 hr tablet Take 2 tablets (1,000 mg total) by mouth 2 (two) times daily. 04/18/21   Jettie Booze, MD  metoprolol tartrate (LOPRESSOR) 50 MG tablet Take 1 tablet (50 mg total) by mouth 2 (two) times daily. 08/31/21   Charlott Rakes, MD  Multiple Vitamin (MULTIVITAMIN WITH MINERALS) TABS tablet Take 1 tablet by mouth daily.    [provider]  nitroGLYCERIN (NITROSTAT) 0.4 MG SL tablet Place 1 tablet (0.4 mg total) under the tongue every 5 (five) minutes as needed for chest pain. 03/22/17   Isaiah Serge, NP  pantoprazole (PROTONIX) 40 MG tablet Take 1 tablet (40 mg total) by mouth 2 (two) times daily. 06/11/21   Charlott Rakes, MD  Semaglutide,0.25 or 0.5MG /DOS, (OZEMPIC, 0.25 OR 0.5 MG/DOSE,) 2 MG/3ML SOPN Inject 0.25 mg into the skin once a week. 08/31/21   Charlott Rakes, MD    Family History Family History  Problem Relation Age of Onset   Heart disease Mother    Diabetes Mother    Cancer Mother        cervical   Diabetes Father    Diabetes  Sister    Heart disease Sister    Diabetes Brother    Heart attack Brother    Lupus Sister    Heart failure Sister    Hepatitis C Sister    Heart failure Brother    Diabetes Brother    Hypertension Daughter    Colon cancer Neg Hx     Social History Social History   Tobacco Use   Smoking status: Some Days    Packs/day: 0.50    Years: 42.00    Total pack years: 21.00    Types: Cigarettes   Smokeless tobacco: Never  Vaping Use   Vaping Use: Never used  Substance Use Topics   Alcohol use: Yes    Alcohol/week: 1.0 - 2.0 standard drink of alcohol    Types: 1 - 2 Glasses of wine per week    Comment: 08/07/2014 "glass of wine maybe 2 times/yr"   Drug use: No    Types: Marijuana    Comment: smoked pot years ago but not currently     Allergies   Ciprofloxacin   Review of Systems Review of Systems As per HPI  Physical Exam Triage Vital Signs ED Triage Vitals  Enc Vitals Group     BP 12/18/21 1036 (!) 119/52     Pulse Rate 12/18/21 1036 89     Resp 12/18/21 1036 18     Temp 12/18/21 1036 98.4 F (36.9 C)     Temp Source 12/18/21 1036 Oral     SpO2 12/18/21 1036 100 %     Weight --      Height --      Head Circumference --      Peak Flow --      Pain Score 12/18/21 1033 10     Pain Loc --      Pain Edu? --      Excl. in [place]? --    No data found.  Updated Vital Signs BP (!) 119/52 (BP Location: Right Arm)   Pulse 89   Temp 98.4 F (36.9 C) (Oral)   Resp 18   LMP  (LMP Unknown)  SpO2 100%   Visual Acuity Right Eye Distance:   Left Eye Distance:   Bilateral Distance:    Right Eye Near:   Left Eye Near:    Bilateral Near:     Physical Exam Vitals and nursing note reviewed.  Constitutional:      Appearance: Normal appearance. She is not ill-appearing or toxic-appearing.     Comments: Very pleasant patient sitting on exam in position of comfort table in no acute distress.   HENT:     Head: Normocephalic and atraumatic.     Right Ear: Hearing and  external ear normal.     Left Ear: Hearing and external ear normal.     Nose: Nose normal.     Mouth/Throat:     Lips: Pink.     Mouth: Mucous membranes are moist.  Eyes:     General: Lids are normal. Vision grossly intact. Gaze aligned appropriately.     Extraocular Movements: Extraocular movements intact.     Conjunctiva/sclera: Conjunctivae normal.  Cardiovascular:     Rate and Rhythm: Normal rate and regular rhythm.     Heart sounds: Normal heart sounds, S1 normal and S2 normal.  Pulmonary:     Effort: Pulmonary effort is normal. No respiratory distress.     Breath sounds: Normal breath sounds and air entry.  Abdominal:     Palpations: Abdomen is soft.  Musculoskeletal:     Cervical back: Neck supple.     Right lower leg: No edema.     Left lower leg: Edema present.     Comments:  Patient wearing sock and cam walker boot to the left foot upon initial assessment. Significant +3 unilateral edema to the generalized dorsal aspect of the left foot with associated warmth, erythema, and tenderness present to physical exam.  Edema spreads proximally up the left calf approximately halfway between the left ankle and the left knee and is +2 swelling to the calf.  There is tenderness with palpation of the left calf.  Bevelyn Buckles' sign positive to the left side.  No erythema or significant warmth to the left calf present.  Capillary refill to the left lower extremity is less than 3.  Difficult to palpate dorsalis pedis or anterior tibialis pulse due to significant swelling of the left foot.  Patient unable to bear weight on the left foot without significant pain.  Skin:    General: Skin is warm and dry.     Capillary Refill: Capillary refill takes less than 2 seconds.     Findings: No rash.  Neurological:     General: No focal deficit present.     Mental Status: She is alert and oriented to person, place, and time. Mental status is at baseline.     Cranial Nerves: No dysarthria or facial asymmetry.      Gait: Gait is intact.  Psychiatric:        Mood and Affect: Mood normal.        Speech: Speech normal.        Behavior: Behavior normal.        Thought Content: Thought content normal.        Judgment: Judgment normal.      UC Treatments / Results  Labs (all labs ordered are listed, but only abnormal results are displayed) Labs Reviewed - No data to display  EKG   Radiology No results found.  Procedures Procedures (including critical care time)  Medications Ordered in UC Medications - No data to display  Initial Impression / Assessment and Plan / UC Course  I have reviewed the triage vital signs and the nursing notes.  Pertinent labs & imaging results that were available during my care of the patient were reviewed by me and considered in my medical decision making (see chart for details).   1.  Left leg swelling and left foot pain Unilateral left foot/calf swelling, erythema, warmth, and tenderness concerning for possible DVT.  Gout considered as possible cause of patient's symptoms, although this is unlikely as swelling is diffuse and not localized.  Advised patient to go to the nearest emergency department for imaging to rule out possible DVT to the left foot/calf due to ongoing and progressively worsening symptoms.  Patient verbalizes understanding and agreement with plan.  Plans to have her daughter come pick her up from urgent care and bring her to the nearest emergency department for evaluation.  Discussed risks of deferring emergency department visit at this time.  Patient discharged from urgent care and stable condition with hemodynamically stable vital signs.   Final Clinical Impressions(s) / UC Diagnoses   Final diagnoses:  Left leg swelling  Foot pain, left     Discharge Instructions      Please go to the nearest emergency department to rule out possible blood clot to your left foot/calf.   ED Prescriptions   None    PDMP not reviewed this  encounter.   Talbot Grumbling, [place] 12/18/21 1108

## 2021-12-18 NOTE — ED Provider Notes (Signed)
I provided a substantive portion of the care of this patient.  I personally performed the entirety of the exam for this encounter. {Remember to document shared critical care using "edcritical" dot phrase:1}    

## 2021-12-18 NOTE — ED Notes (Signed)
Patient is being discharged from the Urgent Care and sent to the Emergency Department via POV . Per  Sharyn Lull NP, patient is in need of higher level of care due to left leg swelling. Patient is aware and verbalizes understanding of plan of care.  Vitals:   12/18/21 1036  BP: (!) 119/52  Pulse: 89  Resp: 18  Temp: 98.4 F (36.9 C)  SpO2: 100%

## 2021-12-18 NOTE — ED Notes (Signed)
Discharge instructions reviewed with patient. Patient verbalized understanding of instructions. Follow-up care and medications were reviewed. Patient ambulatory with steady gait. VSS upon discharge.  ?

## 2021-12-18 NOTE — ED Triage Notes (Signed)
Pt reports left foot pain and swelling x 4 days. Denies any obvious injuries, states just woke up and noticed swelling and pain. States have been taking tylenol for pain.

## 2021-12-18 NOTE — ED Triage Notes (Signed)
Pt. Stated, I came from UC cause they think I have a blood clot. Im having pain in my left leg and foot , started on Monday.  Im wearing a boot cause I can get around easier.

## 2021-12-18 NOTE — ED Notes (Signed)
Patient transported to X-ray 

## 2021-12-18 NOTE — Progress Notes (Signed)
Lower extremity venous left study completed.  Preliminary results relayed to [place], [place].  See CV Proc for preliminary results report.   Darlin Coco, RDMS, RVT

## 2021-12-18 NOTE — Discharge Instructions (Addendum)
It appears as though something bit you to cause your swelling and pain.  I have sent doxycycline, the antibiotic to your pharmacy.  Start this tonight.  Additionally, your heart failure lab was elevated.  Please get in touch with your cardiologist about this for further evaluation and testing.  Return with any worsening symptoms and your doxycycline and tramadol are both at your pharmacy.

## 2021-12-22 ENCOUNTER — Other Ambulatory Visit: Payer: Self-pay

## 2021-12-23 ENCOUNTER — Other Ambulatory Visit: Payer: Self-pay

## 2021-12-24 ENCOUNTER — Other Ambulatory Visit: Payer: Self-pay | Admitting: Family Medicine

## 2021-12-24 ENCOUNTER — Other Ambulatory Visit: Payer: Self-pay

## 2021-12-24 DIAGNOSIS — E1169 Type 2 diabetes mellitus with other specified complication: Secondary | ICD-10-CM

## 2021-12-24 MED ORDER — ATORVASTATIN CALCIUM 10 MG PO TABS
10.0000 mg | ORAL_TABLET | Freq: Every day | ORAL | 0 refills | Status: DC
  Filled 2021-12-24 – 2022-01-01 (×2): qty 90, 90d supply, fill #0

## 2021-12-25 NOTE — Progress Notes (Signed)
ADVANCED [facility] CLINIC NOTE  Referring Physician:Mark Marlou Porch, MD Primary Care: Charlott Rakes, MD Primary Cardiologist: Candee Furbish, MD   HPI:  Deborah Coffey is a 66 y.o. with CAD s/p CABGx1 (LIMA-LAD) 2005, tobacco use, chronic diastolic CHF, anemia, depression, HLD, HTN, DM, CKD stage 3b, prior TIAs, pulmonary HTN (felt due to underlying lung disease/smoking), morbid obesity referred by Dr. Marlou Porch for further evaluation of her [place].    Echocardiogram 12/22 EF 65-70% RV moderately dilated with moderate HK. Flattened septum Moderate TR RVSP est 114mmHG    Chest CT scan 2/22. No PE   R/L cath on 04/16/21 - mLAD 75% LIMA to LAD is widely patent. pRCA 50 - EF 65% - RA 5 PA 79/18 (39) PCW 12  CO 6.4 L/min; CI PVR 4.2  Says she has been smoking since 66 y/o. Smokes ~ 1/2 ppd. 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 Lives with daughter and 3 grandkids.  Here for routine follow-up. Says she is getting out more. Has sleep study this Sunday. SOB with mild activity but feels it is improved. Has to take her time and get warmed. No CP. Does have pain in left shoulder that radiates to her fingers. No edema, orthopnea or PND. Complaint with meds.  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 competitive flow   TIA (transient ischemic attack)    Type II diabetes mellitus ([facility])     Current Outpatient Medications  Medication Sig Dispense Refill    acetaminophen (TYLENOL) 500 MG tablet Take 1,000 mg by mouth every 6 (six) hours as needed for headache (pain).      Alcohol Swabs (B-D SINGLE USE SWABS REGULAR) PADS USE AS DIRECTED 100 each 0   aspirin EC 81 MG tablet Take 81 mg by mouth daily.     atorvastatin (LIPITOR) 10 MG tablet Take 1 tablet (10 mg total) by mouth once daily. 90 tablet 0   Blood Glucose Monitoring Suppl (ONETOUCH VERIO REFLECT) w/Device KIT Use as instructed to test blood glucose 3 times daily. 1 kit 0   clobetasol cream (TEMOVATE) 0.05 % Apply topically 2 (two) times daily. 60 g 0   Continuous Blood Gluc Sensor (DEXCOM G6 SENSOR) MISC Use to check blood sugar three times daily. Dx. E11.59 3 each 3   cyclobenzaprine (FLEXERIL) 10 MG tablet Take 1 tablet (10 mg total) by mouth 2 (two) times daily as needed for muscle spasms. 20 tablet 0   dapagliflozin propanediol (FARXIGA) 5 MG TABS tablet Take 1 tablet (5 mg total) by mouth daily before breakfast. (Patient not taking: Reported on 09/04/2021) 90 tablet 1   doxycycline (VIBRAMYCIN) 100 MG capsule Take 1 capsule (100 mg total) by mouth 2 (two) times daily. 13 capsule 0   furosemide (LASIX) 20 MG tablet Take 2 tablets (40  mg total) by mouth daily. 60 tablet 0   gabapentin (NEURONTIN) 300 MG capsule Take orally 2 caps (600 mg) in the morning and 3 capsules (900 mg) in the evening 450 capsule 1   glucose blood (ONETOUCH VERIO) test strip Use as instructed to test blood glucose 3 times daily. 100 each 2   insulin glargine, 2 Unit Dial, (TOUJEO MAX SOLOSTAR) 300 UNIT/ML Solostar Pen Inject 55 Units into the skin daily. 30 mL 6   Insulin Pen Needle (DROPLET PEN NEEDLES) 31G X 8 MM MISC USE WITH TOUJEO SOLOSTAR PEN EVERY DAY 100 each 4   Lancets (ONETOUCH DELICA PLUS LANCET33G) MISC Use as instructed to test blood glucose 3 times daily. 100 each 2   lisinopril (ZESTRIL) 20 MG tablet Take 0.5 tablets (10 mg total) by mouth daily. 30 tablet 6   metFORMIN (GLUCOPHAGE-XR) 500 MG 24 hr  tablet Take 2 tablets (1,000 mg total) by mouth 2 (two) times daily. 360 tablet 1   metoprolol tartrate (LOPRESSOR) 50 MG tablet Take 1 tablet (50 mg total) by mouth 2 (two) times daily. 180 tablet 1   Multiple Vitamin (MULTIVITAMIN WITH MINERALS) TABS tablet Take 1 tablet by mouth daily.     nitroGLYCERIN (NITROSTAT) 0.4 MG SL tablet Place 1 tablet (0.4 mg total) under the tongue every 5 (five) minutes as needed for chest pain. 25 tablet 3   pantoprazole (PROTONIX) 40 MG tablet Take 1 tablet (40 mg total) by mouth 2 (two) times daily. 180 tablet 0   Semaglutide,0.25 or 0.5MG /DOS, (OZEMPIC, 0.25 OR 0.5 MG/DOSE,) 2 MG/3ML SOPN Inject 0.25 mg into the skin once a week. 3 mL 1   traMADol (ULTRAM) 50 MG tablet Take 1 tablet (50 mg total) by mouth every 6 (six) hours as needed. 5 tablet 0   No current facility-administered medications for this visit.    Allergies  Allergen Reactions   Ciprofloxacin Rash      Social History   Socioeconomic History   Marital status: Divorced    Spouse name: Not on file   Number of children: Not on file   Years of education: Not on file   Highest education level: Not on file  Occupational History   Not on file  Tobacco Use   Smoking status: Some Days    Packs/day: 0.50    Years: 42.00    Total pack years: 21.00    Types: Cigarettes   Smokeless tobacco: Never  Vaping Use   Vaping Use: Never used  Substance and Sexual Activity   Alcohol use: Yes    Alcohol/week: 1.0 - 2.0 standard drink of alcohol    Types: 1 - 2 Glasses of wine per week    Comment: 08/07/2014 "glass of wine maybe 2 times/yr"   Drug use: No    Types: Marijuana    Comment: smoked pot years ago but not currently   Sexual activity: Not Currently  Other Topics Concern   Not on file  Social History Narrative   Not on file   Social Determinants of Health   Financial Resource Strain: Not on file  Food Insecurity: Not on file  Transportation Needs: Not on file  Physical Activity:  Not on file  Stress: Not on file  Social Connections: Not on file  Intimate Partner Violence: Not on file      Family History  Problem Relation Age of Onset   Heart disease Mother    Diabetes Mother    Cancer Mother  cervical   Diabetes Father    Diabetes Sister    Heart disease Sister    Diabetes Brother    Heart attack Brother    Lupus Sister    Heart failure Sister    Hepatitis C Sister    Heart failure Brother    Diabetes Brother    Hypertension Daughter    Colon cancer Neg Hx     There were no vitals filed for this visit.  There were no vitals filed for this visit.    PHYSICAL EXAM: General:  Obese woman. No resp difficulty HEENT: normal Neck: supple. no JVD. Carotids 2+ bilat; no bruits. No lymphadenopathy or thryomegaly appreciated. Cor: PMI nondisplaced. Regular rate & rhythm. No rubs, gallops or murmurs. Lungs: clear but decreased throughout  Abdomen: soft, nontender, nondistended. No hepatosplenomegaly. No bruits or masses. Good bowel sounds. Extremities: no cyanosis, clubbing, rash, edema Neuro: alert & orientedx3, cranial nerves grossly intact. moves all 4 extremities w/o difficulty. Affect pleasant   ASSESSMENT & PLAN:  1. Pulmonary HTN with RV strain - Echo 12/22 EF 65-70% RV moderately dilated with moderate HK. Flattened septum Moderate TR RVSP est 19mmHG   - Chest CT scan 2/22. No PE - RHC 12/22 - RA 5 PA 79/18 (39) PCW 12  CO 6.4 L/min; CI PVR 4.2 - PFTs with significant restrictive/obstructive lung disease - Hall walk 95% -> 88% - ANA, ANCA, Leake-70, RF all negative - Sleep study pending - Suspect mostly WHO Group III. Given severity of lung disease would not try sildenafil given risk of shunting - Discussed need for weight loss and smoking cessation - Refer Pulmonary Rehab.  - I will see back in 6 months with echo   2. CAD - CABG 2005 LIMA-> LAD -cath on 04/16/21 - mLAD 75% LIMA to LAD is widely patent. pRCA 50 - No current  angina - Continue ASA/statin - Management per Dr. Marlou Porch  3. Morbid obesity - needs weight loss - Continue with [facility] - consider [place], FNP  3:56 PM

## 2021-12-28 ENCOUNTER — Encounter (HOSPITAL_COMMUNITY): Payer: Self-pay

## 2021-12-28 ENCOUNTER — Ambulatory Visit (HOSPITAL_COMMUNITY)
Admission: RE | Admit: 2021-12-28 | Discharge: 2021-12-28 | Disposition: A | Discharge status: Home / Self Care | Payer: No Typology Code available for payment source | Source: Ambulatory Visit | Attending: Family Medicine | Admitting: Family Medicine

## 2021-12-28 VITALS — BP 134/78 | HR 99 | Wt 237.6 lb

## 2021-12-28 DIAGNOSIS — N1832 Chronic kidney disease, stage 3b: Secondary | ICD-10-CM | POA: Diagnosis not present

## 2021-12-28 DIAGNOSIS — F17290 Nicotine dependence, other tobacco product, uncomplicated: Secondary | ICD-10-CM | POA: Insufficient documentation

## 2021-12-28 DIAGNOSIS — Z79899 Other long term (current) drug therapy: Secondary | ICD-10-CM | POA: Diagnosis not present

## 2021-12-28 DIAGNOSIS — E1122 Type 2 diabetes mellitus with diabetic chronic kidney disease: Secondary | ICD-10-CM | POA: Diagnosis not present

## 2021-12-28 DIAGNOSIS — F32A Depression, unspecified: Secondary | ICD-10-CM | POA: Diagnosis not present

## 2021-12-28 DIAGNOSIS — M7989 Other specified soft tissue disorders: Secondary | ICD-10-CM

## 2021-12-28 DIAGNOSIS — Z8673 Personal history of transient ischemic attack (TIA), and cerebral infarction without residual deficits: Secondary | ICD-10-CM | POA: Insufficient documentation

## 2021-12-28 DIAGNOSIS — E785 Hyperlipidemia, unspecified: Secondary | ICD-10-CM | POA: Diagnosis not present

## 2021-12-28 DIAGNOSIS — Z6839 Body mass index (BMI) 39.0-39.9, adult: Secondary | ICD-10-CM | POA: Insufficient documentation

## 2021-12-28 DIAGNOSIS — I272 Pulmonary hypertension, unspecified: Secondary | ICD-10-CM | POA: Diagnosis not present

## 2021-12-28 DIAGNOSIS — I130 Hypertensive heart and chronic kidney disease with heart failure and stage 1 through stage 4 chronic kidney disease, or unspecified chronic kidney disease: Secondary | ICD-10-CM | POA: Diagnosis not present

## 2021-12-28 DIAGNOSIS — Z7984 Long term (current) use of oral hypoglycemic drugs: Secondary | ICD-10-CM | POA: Diagnosis not present

## 2021-12-28 DIAGNOSIS — G4733 Obstructive sleep apnea (adult) (pediatric): Secondary | ICD-10-CM | POA: Insufficient documentation

## 2021-12-28 DIAGNOSIS — Z951 Presence of aortocoronary bypass graft: Secondary | ICD-10-CM | POA: Insufficient documentation

## 2021-12-28 DIAGNOSIS — I2510 Atherosclerotic heart disease of native coronary artery without angina pectoris: Secondary | ICD-10-CM | POA: Diagnosis not present

## 2021-12-28 DIAGNOSIS — D649 Anemia, unspecified: Secondary | ICD-10-CM | POA: Diagnosis not present

## 2021-12-28 DIAGNOSIS — I5032 Chronic diastolic (congestive) heart failure: Secondary | ICD-10-CM | POA: Diagnosis not present

## 2021-12-28 DIAGNOSIS — J449 Chronic obstructive pulmonary disease, unspecified: Secondary | ICD-10-CM | POA: Insufficient documentation

## 2021-12-28 LAB — BASIC METABOLIC PANEL
Anion gap: 9 (ref 5–15)
BUN: 22 mg/dL (ref 8–23)
CO2: 24 mmol/L (ref 22–32)
Calcium: 9.4 mg/dL (ref 8.9–10.3)
Chloride: 106 mmol/L (ref 98–111)
Creatinine, Ser: 1.27 mg/dL — ABNORMAL HIGH (ref 0.44–1.00)
GFR, Estimated: 47 mL/min — ABNORMAL LOW (ref >60)
Glucose, Bld: 219 mg/dL — ABNORMAL HIGH (ref 70–99)
Potassium: 5.3 mmol/L — ABNORMAL HIGH (ref 3.5–5.1)
Sodium: 139 mmol/L (ref 135–145)

## 2021-12-28 LAB — BRAIN NATRIURETIC PEPTIDE: B Natriuretic Peptide: 907.7 pg/mL — ABNORMAL HIGH (ref 0.0–100.0)

## 2021-12-28 LAB — URIC ACID: Uric Acid, Serum: 7.8 mg/dL — ABNORMAL HIGH (ref 2.5–7.1)

## 2021-12-28 NOTE — Patient Instructions (Addendum)
Thank you for coming in today  Labs were done today, if any labs are abnormal the clinic will call you No news is good news  Your physician recommends that you schedule a follow-up appointment in:  3 months with echocardiogram with Dr. Haroldine Laws  Your physician has requested that you have an echocardiogram. Echocardiography is a painless test that uses sound waves to create images of your heart. It provides your doctor with information about the size and shape of your heart and how well your heart's chambers and valves are working. This procedure takes approximately one hour. There are no restrictions for this procedure.   TAKE extra 40 mg of lasix for 2 days   You have been referred to Dr. Radford Pax for sleep apnea  You have been referred to pulmonary rehab they will contact you for further details    Do the following things EVERYDAY: Weigh yourself in the morning before breakfast. Write it down and keep it in a log. Take your medicines as prescribed Eat low salt foods--Limit salt (sodium) to 2000 mg per day.  Stay as active as you can everyday Limit all fluids for the day to less than 2 liters  At the [facility], you and your health needs are our priority. As part of our continuing mission to provide you with exceptional heart care, we have created designated Provider Care Teams. These Care Teams include your primary Cardiologist (physician) and Advanced Practice Providers (APPs- Physician Assistants and Nurse Practitioners) who all work together to provide you with the care you need, when you need it.   You may see any of the following providers on your designated Care Team at your next follow up: Dr Glori Bickers Dr Loralie Champagne Dr. Roxana Hires, NP Lyda Jester, [place] [facility] [place], [place] Forestine Na, NP Audry Riles, PharmD   Please be sure to bring in all your medications bottles to every appointment.   If you have any  questions or concerns before your next appointment please send [place] a message through [place] or call our office at [phone].    TO LEAVE A MESSAGE FOR THE NURSE SELECT OPTION 2, PLEASE LEAVE A MESSAGE INCLUDING: YOUR NAME DATE OF BIRTH CALL BACK NUMBER REASON FOR CALL**this is important as we prioritize the call backs  YOU WILL RECEIVE A CALL BACK THE SAME DAY AS LONG AS YOU CALL BEFORE 4:00 PM

## 2021-12-29 ENCOUNTER — Telehealth (HOSPITAL_COMMUNITY): Payer: Self-pay

## 2021-12-29 DIAGNOSIS — I5032 Chronic diastolic (congestive) heart failure: Secondary | ICD-10-CM

## 2021-12-29 NOTE — Telephone Encounter (Signed)
Patient advised and verbalized understanding,lab appointment scheduled,lab orders entered Orders Placed This Encounter  Procedures   Basic metabolic panel    Standing Status:   Future    Standing Expiration Date:   12/30/2022    Order Specific Question:   Release to patient    Answer:   Immediate

## 2021-12-29 NOTE — Telephone Encounter (Signed)
-----   Message from Rafael Bihari, [place] sent at 12/28/2021  1:24 PM EDT ----- BNP elevated. Keep torsemide at 40 mg bid. K is mildly elevated but increase in Lasix should correct this.  Repeat BMET in 10 days.  Uric acid level is mildly elevated. Diuresis will help with LE swelling but needs PCP follow up if LLE continues to hurt

## 2021-12-30 ENCOUNTER — Other Ambulatory Visit: Payer: Self-pay

## 2022-01-01 ENCOUNTER — Other Ambulatory Visit: Payer: Self-pay | Admitting: Physician Assistant

## 2022-01-01 ENCOUNTER — Other Ambulatory Visit: Payer: Self-pay

## 2022-01-01 DIAGNOSIS — E1165 Type 2 diabetes mellitus with hyperglycemia: Secondary | ICD-10-CM

## 2022-01-01 MED ORDER — DROPLET PEN NEEDLES 31G X 8 MM MISC
0 refills | Status: DC
  Filled 2022-01-01: qty 100, 100d supply, fill #0
  Filled 2022-01-08: qty 100, 90d supply, fill #0

## 2022-01-07 ENCOUNTER — Other Ambulatory Visit: Payer: Self-pay

## 2022-01-07 ENCOUNTER — Other Ambulatory Visit: Payer: Self-pay | Admitting: Family Medicine

## 2022-01-07 ENCOUNTER — Ambulatory Visit (HOSPITAL_COMMUNITY)
Admission: RE | Admit: 2022-01-07 | Discharge: 2022-01-07 | Disposition: A | Discharge status: Home / Self Care | Payer: No Typology Code available for payment source | Source: Ambulatory Visit | Attending: Internal Medicine | Admitting: Internal Medicine

## 2022-01-07 ENCOUNTER — Other Ambulatory Visit (HOSPITAL_COMMUNITY): Payer: Self-pay

## 2022-01-07 DIAGNOSIS — I5032 Chronic diastolic (congestive) heart failure: Secondary | ICD-10-CM

## 2022-01-07 DIAGNOSIS — I5022 Chronic systolic (congestive) heart failure: Secondary | ICD-10-CM | POA: Diagnosis not present

## 2022-01-07 LAB — BASIC METABOLIC PANEL
Anion gap: 8 (ref 5–15)
BUN: 28 mg/dL — ABNORMAL HIGH (ref 8–23)
CO2: 25 mmol/L (ref 22–32)
Calcium: 9.7 mg/dL (ref 8.9–10.3)
Chloride: 108 mmol/L (ref 98–111)
Creatinine, Ser: 1.34 mg/dL — ABNORMAL HIGH (ref 0.44–1.00)
GFR, Estimated: 44 mL/min — ABNORMAL LOW (ref >60)
Glucose, Bld: 143 mg/dL — ABNORMAL HIGH (ref 70–99)
Potassium: 4.3 mmol/L (ref 3.5–5.1)
Sodium: 141 mmol/L (ref 135–145)

## 2022-01-07 MED ORDER — FUROSEMIDE 20 MG PO TABS
40.0000 mg | ORAL_TABLET | Freq: Two times a day (BID) | ORAL | 3 refills | Status: DC
Start: 2022-01-07 — End: 2022-01-20
  Filled 2022-01-07: qty 360, 90d supply, fill #0

## 2022-01-08 ENCOUNTER — Other Ambulatory Visit: Payer: Self-pay

## 2022-01-20 ENCOUNTER — Ambulatory Visit: Payer: No Typology Code available for payment source | Attending: Family Medicine | Admitting: Physician Assistant

## 2022-01-20 ENCOUNTER — Encounter: Payer: Self-pay | Admitting: Physician Assistant

## 2022-01-20 VITALS — BP 102/64 | HR 111 | Temp 98.0°F | Ht 65.0 in | Wt 234.2 lb

## 2022-01-20 DIAGNOSIS — E1169 Type 2 diabetes mellitus with other specified complication: Secondary | ICD-10-CM

## 2022-01-20 DIAGNOSIS — E785 Hyperlipidemia, unspecified: Secondary | ICD-10-CM | POA: Diagnosis not present

## 2022-01-20 DIAGNOSIS — Z794 Long term (current) use of insulin: Secondary | ICD-10-CM | POA: Diagnosis not present

## 2022-01-20 DIAGNOSIS — E1142 Type 2 diabetes mellitus with diabetic polyneuropathy: Secondary | ICD-10-CM | POA: Diagnosis not present

## 2022-01-20 DIAGNOSIS — E860 Dehydration: Secondary | ICD-10-CM | POA: Diagnosis not present

## 2022-01-20 DIAGNOSIS — E1159 Type 2 diabetes mellitus with other circulatory complications: Secondary | ICD-10-CM

## 2022-01-20 DIAGNOSIS — I5032 Chronic diastolic (congestive) heart failure: Secondary | ICD-10-CM

## 2022-01-20 DIAGNOSIS — I152 Hypertension secondary to endocrine disorders: Secondary | ICD-10-CM

## 2022-01-20 LAB — GLUCOSE, POCT (MANUAL RESULT ENTRY): POC Glucose: 86 mg/dl (ref 70–99)

## 2022-01-20 LAB — POCT GLYCOSYLATED HEMOGLOBIN (HGB A1C): HbA1c, POC (controlled diabetic range): 7.1 % — AB (ref 0.0–7.0)

## 2022-01-20 MED ORDER — LISINOPRIL 20 MG PO TABS: 10.0000 mg | ORAL_TABLET | Freq: Every day | ORAL | 6 refills | Status: DC

## 2022-01-20 MED ORDER — FUROSEMIDE 20 MG PO TABS: 40.0000 mg | ORAL_TABLET | Freq: Two times a day (BID) | ORAL | 3 refills | Status: DC

## 2022-01-20 MED ORDER — METOPROLOL TARTRATE 50 MG PO TABS: 50.0000 mg | ORAL_TABLET | Freq: Two times a day (BID) | ORAL | 1 refills | Status: DC

## 2022-01-20 MED ORDER — TOUJEO MAX SOLOSTAR 300 UNIT/ML ~~LOC~~ SOPN: 55.0000 [IU] | PEN_INJECTOR | Freq: Every day | SUBCUTANEOUS | 6 refills | Status: DC

## 2022-01-20 MED ORDER — GABAPENTIN 300 MG PO CAPS: ORAL_CAPSULE | ORAL | 1 refills | Status: DC

## 2022-01-20 MED ORDER — DAPAGLIFLOZIN PROPANEDIOL 5 MG PO TABS: 5.0000 mg | ORAL_TABLET | Freq: Every day | ORAL | 1 refills | Status: DC

## 2022-01-20 MED ORDER — METFORMIN HCL ER 500 MG PO TB24: 1000.0000 mg | ORAL_TABLET | Freq: Two times a day (BID) | ORAL | 1 refills | Status: DC

## 2022-01-20 MED ORDER — ATORVASTATIN CALCIUM 10 MG PO TABS: 10.0000 mg | ORAL_TABLET | Freq: Every day | ORAL | 0 refills | Status: DC

## 2022-01-20 NOTE — Patient Instructions (Signed)
Your current fluid allowance is up to 2 L of fluid daily(66oz).  The majority of that should be water.    Do your daily weights.  Continue current diabetes regimen and resume farxiga(she was not taking)

## 2022-01-20 NOTE — Progress Notes (Signed)
Patient ID: Deborah Coffey, female   DOB: [birth year], 66 y.o.   MRN: [id number]   Deborah Coffey, is a 66 y.o. female  [id number]:[id number]  [id number]  DOB - [birth year]  Chief Complaint  Patient presents with   Diabetes    Diabetes f/u. Feeling more fatigue.       Subjective:   Deborah Coffey is a 66 y.o. female here today for diabetes check.  She has not been taking farxiga but is compliant with other meds.  She did not think she was supposed to still be taking it.  She has not taken ozempic "in a long time."  She feels good today.  Energy is good.  She does not drink any water and has drank a 1/2 diet soda today.  She is having an echo and being followed by cardiology closely currently.  She has not been weighing. No SOB/CP.  Foot has much improved.  She completed antibiotic.  On 9/11 she weighed 237.  Today she is 234.  No problems updated.  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 competitive flow   TIA (transient ischemic attack)    Type II diabetes mellitus ([place])     MEDICATIONS AT HOME: Prior to Admission medications   Medication Sig Start Date End Date Taking? Authorizing Provider  acetaminophen (TYLENOL) 500 MG tablet Take 1,000 mg by mouth every 6 (six) hours as needed for headache (pain).    Yes [provider]  Alcohol Swabs (B-D SINGLE USE SWABS REGULAR) PADS USE AS  DIRECTED 04/13/17  Yes Tresa Garter, MD  aspirin EC 81 MG tablet Take 81 mg by mouth daily.   Yes [provider]  Blood Glucose Monitoring Suppl (ONETOUCH VERIO REFLECT) w/Device KIT Use as instructed to test blood glucose 3 times daily. 09/15/21  Yes Charlott Rakes, MD  clobetasol cream (TEMOVATE) 0.05 % Apply topically 2 (two) times daily. 12/09/21  Yes Charlott Rakes, MD  Continuous Blood Gluc Sensor (DEXCOM G6 SENSOR) MISC Use to check blood sugar three times daily. Dx. E11.59 04/29/21  Yes Charlott Rakes, MD  cyclobenzaprine (FLEXERIL) 10 MG tablet Take 1 tablet (10 mg total) by mouth 2 (two) times daily as needed for muscle spasms. 09/19/19  Yes Domenic Moras, PA-C  glucose blood (ONETOUCH VERIO) test strip Use as instructed to test blood glucose 3 times daily. 09/15/21  Yes Newlin, Charlane Ferretti, MD  Insulin Pen Needle (DROPLET PEN NEEDLES) 31G X 8 MM MISC USE WITH TOUJEO SOLOSTAR PEN EVERY DAY 01/01/22  Yes Newlin, Charlane Ferretti, MD  Lancets (ONETOUCH DELICA PLUS RJJOAC16S) MISC Use as instructed to test blood glucose 3 times daily. 09/15/21  Yes Charlott Rakes, MD  Multiple Vitamin (MULTIVITAMIN WITH MINERALS) TABS tablet Take 1 tablet by mouth daily.   Yes [provider]  nitroGLYCERIN (NITROSTAT) 0.4 MG SL tablet Place 1 tablet (0.4 mg total) under the tongue every 5 (five) minutes as needed for chest pain. 03/22/17  Yes Isaiah Serge, NP  pantoprazole (PROTONIX) 40 MG tablet Take 1 tablet (40 mg total) by mouth 2 (two) times daily. 06/11/21  Yes Charlott Rakes, MD  atorvastatin (LIPITOR) 10 MG tablet Take 1 tablet (10 mg total) by mouth once daily. 01/20/22   Argentina Donovan, PA-C  dapagliflozin propanediol (FARXIGA) 5 MG TABS tablet Take 1 tablet (5 mg total) by mouth daily before breakfast. 01/20/22   Argentina Donovan, PA-C  doxycycline (VIBRAMYCIN) 100 MG capsule Take 1 capsule (100 mg total) by mouth 2 (two) times daily. Patient not taking: Reported on 01/20/2022 12/18/21    Redwine, Madison A, PA-C  furosemide (LASIX) 20 MG tablet Take 2 tablets (40 mg total) by mouth 2 (two) times daily. 01/20/22   Argentina Donovan, PA-C  gabapentin (NEURONTIN) 300 MG capsule Take orally 2 caps (600 mg) in the morning and 3 capsules (900 mg) in the evening 01/20/22   Argentina Donovan, PA-C  insulin glargine, 2 Unit Dial, (TOUJEO MAX SOLOSTAR) 300 UNIT/ML Solostar Pen Inject 55 Units into the skin daily. 01/20/22   Argentina Donovan, PA-C  lisinopril (ZESTRIL) 20 MG tablet Take 0.5 tablets (10 mg total) by mouth daily. 01/20/22   Argentina Donovan, PA-C  metFORMIN (GLUCOPHAGE-XR) 500 MG 24 hr tablet Take 2 tablets (1,000 mg total) by mouth 2 (two) times daily. 01/20/22   Argentina Donovan, PA-C  metoprolol tartrate (LOPRESSOR) 50 MG tablet Take 1 tablet (50 mg total) by mouth 2 (two) times daily. 01/20/22   Argentina Donovan, PA-C  traMADol (ULTRAM) 50 MG tablet Take 1 tablet (50 mg total) by mouth every 6 (six) hours as needed. Patient not taking: Reported on 01/20/2022 12/18/21   Redwine, Madison A, PA-C    ROS: Neg HEENT Neg GI Neg GU Neg MS Neg psych Neg neuro  Objective:   Vitals:   01/20/22 1402 01/20/22 1405  BP: 98/66 102/64  Pulse: (!) 111   Temp: 98 F (36.7 C)   TempSrc: Oral   SpO2: 99%   Weight: 234 lb 3.2 oz (106.2 kg)   Height: _0  (1.651 m)    Exam General appearance : Awake, alert, not in any distress. Speech Clear. Not toxic looking HEENT: Atraumatic and Normocephalic Neck: Supple, no JVD. No cervical lymphadenopathy.  Chest: Good air entry bilaterally, CTAB.  No rales/rhonchi/wheezing CVS: S1 S2 regular, no murmurs.  Extremities: B/L Lower Ext shows no edema, both legs are warm to touch Neurology: Awake alert, and oriented X 3, CN II-XII intact, Non focal Skin: No Rash  Data Review Lab Results  Component Value Date   HGBA1C 7.1 (A) 01/20/2022   HGBA1C 8.2 (A) 08/31/2021   HGBA1C 7.8 (A) 04/27/2021    Assessment & Plan   1. Type 2  diabetes mellitus with other circulatory complication, with long-term current use of insulin (HCC) Improved-continue current regimen and resume farxiga. - Glucose (CBG) - HgB A1c - insulin glargine, 2 Unit Dial, (TOUJEO MAX SOLOSTAR) 300 UNIT/ML Solostar Pen; Inject 55 Units into the skin daily.  Dispense: 30 mL; Refill: 6 - dapagliflozin propanediol (FARXIGA) 5 MG TABS tablet; Take 1 tablet (5 mg total) by mouth daily before breakfast.  Dispense: 90 tablet; Refill: 1 Your current fluid allowance is up to 2 L of fluid daily(66oz).  The majority of that should be  water.    Do your daily weights.  Continue current diabetes regimen and resume farxiga(she was not taking)  2. Hyperlipidemia associated with type 2 diabetes mellitus (HCC) - metFORMIN (GLUCOPHAGE-XR) 500 MG 24 hr tablet; Take 2 tablets (1,000 mg total) by mouth 2 (two) times daily.  Dispense: 360 tablet; Refill: 1 - atorvastatin (LIPITOR) 10 MG tablet; Take 1 tablet (10 mg total) by mouth once daily.  Dispense: 90 tablet; Refill: 0  3. Diabetic polyneuropathy associated with type 2 diabetes mellitus (HCC) - gabapentin (NEURONTIN) 300 MG capsule; Take orally 2 caps (600 mg) in the morning and 3 capsules (900 mg) in the evening  Dispense: 450 capsule; Refill: 1  4. Hypertension associated with diabetes ([place]) Bp on the low side today but she is asymptomatic.  She does not drink water and has only had 1/2 diet soda today so I believe BP is due to volume depletion with BID diuretics - lisinopril (ZESTRIL) 20 MG tablet; Take 0.5 tablets (10 mg total) by mouth daily.  Dispense: 30 tablet; Refill: 6 - metoprolol tartrate (LOPRESSOR) 50 MG tablet; Take 1 tablet (50 mg total) by mouth 2 (two) times daily.  Dispense: 180 tablet; Refill: 1  5. Chronic diastolic CHF (congestive heart failure) (HCC) See #4 - metoprolol tartrate (LOPRESSOR) 50 MG tablet; Take 1 tablet (50 mg total) by mouth 2 (two) times daily.  Dispense: 180 tablet; Refill:  1  6. Dehydration Your current fluid allowance is up to 2 L of fluid daily(66oz).  The majority of that should be water.   Do your daily weights.  Continue current diabetes regimen and resume farxiga(she was not taking)    Return in about 3 months (around 04/22/2022) for with PCP.  The patient was given clear instructions to go to ER or return to medical center if symptoms don't improve, worsen or new problems develop. The patient verbalized understanding. The patient was told to call to get lab results if they haven't heard anything in the next week.      Freeman Caldron, PA-C [facility] and [place], [place]   01/20/2022, 2:37 PM

## 2022-01-21 ENCOUNTER — Telehealth (HOSPITAL_COMMUNITY): Payer: Self-pay

## 2022-01-21 ENCOUNTER — Encounter (HOSPITAL_COMMUNITY): Payer: Self-pay

## 2022-01-21 NOTE — Telephone Encounter (Signed)
Attempted to call patient in regards to Cardiac Rehab - LM on VM Mailed letter 

## 2022-01-26 ENCOUNTER — Telehealth (HOSPITAL_COMMUNITY): Payer: Self-pay

## 2022-01-26 ENCOUNTER — Other Ambulatory Visit: Payer: Self-pay | Admitting: Family Medicine

## 2022-01-26 DIAGNOSIS — E1169 Type 2 diabetes mellitus with other specified complication: Secondary | ICD-10-CM

## 2022-01-26 DIAGNOSIS — Z794 Long term (current) use of insulin: Secondary | ICD-10-CM

## 2022-01-26 DIAGNOSIS — E1159 Type 2 diabetes mellitus with other circulatory complications: Secondary | ICD-10-CM

## 2022-01-26 DIAGNOSIS — E1142 Type 2 diabetes mellitus with diabetic polyneuropathy: Secondary | ICD-10-CM

## 2022-01-26 DIAGNOSIS — I5032 Chronic diastolic (congestive) heart failure: Secondary | ICD-10-CM

## 2022-01-26 NOTE — Telephone Encounter (Signed)
Copied from [place] [id number]. Topic: General - Other >> Jan 26, 2022  4:49 PM [name] wrote: Reason for CRM: Medication Refill - Medication: atorvastatin (LIPITOR) 10 MG tablet [[id number]]   dapagliflozin propanediol (FARXIGA) 5 MG TABS tablet [[id number]]   furosemide (LASIX) 20 MG tablet [[id number]]   gabapentin (NEURONTIN) 300 MG capsule [[id number]]   insulin glargine, 2 Unit Dial, (TOUJEO MAX SOLOSTAR) 300 UNIT/ML Solostar Pen [[id number]]   lisinopril (ZESTRIL) 20 MG tablet [[id number]]   metFORMIN (GLUCOPHAGE-XR) 500 MG 24 hr tablet [[id number]]   metoprolol tartrate (LOPRESSOR) 50 MG tablet [[id number]]   Has the patient contacted their pharmacy? Yes.   (Agent: If no, request that the patient contact the pharmacy for the refill. If patient does not wish to contact the pharmacy document the reason why and proceed with request.) (Agent: If yes, when and what did the pharmacy advise?)  Preferred Pharmacy (with phone number or street name): [facility] [place], [place] to [address] [place] [address] Phone: [phone] Fax: [phone] Hours: Not open 24 hours   Has the patient been seen for an appointment in the last year OR does the patient have an upcoming appointment? Yes.    Agent: Please be advised that RX refills may take up to 3 business days. We ask that you follow-up with your pharmacy.

## 2022-01-26 NOTE — Telephone Encounter (Signed)
Called patient to see if she was interested in participating in the Cardiac Rehab Program. Patient stated yes. Patient will come in for orientation on 02/04/22 @ 9AM and will attend the 10:15AM exercise class. Went over insurance, patient verbalized understanding.    Tourist information centre manager.

## 2022-01-26 NOTE — Telephone Encounter (Signed)
Pt insurance is active and benefits verified through [facility]. Co-pay $10.00, DED $0.00/$0.00 met, out of pocket $3,700.00/$115.00 met, co-insurance 0%. No pre-authorization required. Jasmine A., 01/26/22 @ 11:15AM, REF#[id number]   How many CR sessions are covered? (36 sessions for TCR, 72 sessions for ICR)72 Is this a lifetime maximum or an annual maximum? annual Has the member used any of these services to date? No Is there a time limit (weeks/months) on start of program and/or program completion? No

## 2022-01-27 NOTE — Telephone Encounter (Signed)
Requested medication (s) are due for refill today: yes to different pharmacy  Requested medication (s) are on the active medication list yes  Last refill:  01/20/22  Future visit scheduled:yes  Notes to clinic:  Unable to refill per protocol, last refill by provider 01/20/22. [facility]  [facility] and [facility]ill, routing for review.     Requested Prescriptions  Pending Prescriptions Disp Refills   atorvastatin (LIPITOR) 10 MG tablet 90 tablet 0    Sig: Take 1 tablet (10 mg total) by mouth once daily.     Cardiovascular:  Antilipid - Statins Failed - 01/26/2022  5:10 PM      Failed - Lipid Panel in normal range within the last 12 months    Cholesterol, Total  Date Value Ref Range Status  01/21/2021 161 100 - 199 mg/dL Final   LDL Chol Calc (NIH)  Date Value Ref Range Status  01/21/2021 70 0 - 99 mg/dL Final   HDL  Date Value Ref Range Status  01/21/2021 72 >39 mg/dL Final   Triglycerides  Date Value Ref Range Status  01/21/2021 104 0 - 149 mg/dL Final         Passed - Patient is not pregnant      Passed - Valid encounter within last 12 months    Recent Outpatient Visits           1 week ago Type 2 diabetes mellitus with other circulatory complication, with long-term current use of insulin ([place])   [place], [place], [place]   4 months ago Type 2 diabetes mellitus with other circulatory complication, with long-term current use of insulin ([place])   [place], [place], MD   8 months ago Encounter for [employer] annual wellness exam   [place], [place], MD   9 months ago Type 2 diabetes mellitus with other circulatory complication, with long-term current use of insulin ([place])   [place], Charlane Ferretti, MD   1 year ago Type 2 diabetes mellitus with other circulatory  complication, with long-term current use of insulin ([place])   [place], [place], [place]       Future Appointments             In 3 months Charlott Rakes, MD [place]             dapagliflozin propanediol (FARXIGA) 5 MG TABS tablet 90 tablet 1    Sig: Take 1 tablet (5 mg total) by mouth daily before breakfast.     Endocrinology:  Diabetes - SGLT2 Inhibitors Failed - 01/26/2022  5:10 PM      Failed - Cr in normal range and within 360 days    Creat  Date Value Ref Range Status  02/27/2015 1.02 0.50 - 1.05 mg/dL Final   Creatinine, Ser  Date Value Ref Range Status  01/07/2022 1.34 (H) 0.44 - 1.00 mg/dL Final   Creatinine, Urine  Date Value Ref Range Status  06/26/2015 115 20 - 320 mg/dL Final         Failed - eGFR in normal range and within 360 days    GFR, Est African American  Date Value Ref Range Status  02/27/2015 71 >=60 mL/min Final   GFR calc Af Amer  Date Value Ref Range Status  10/04/2019  46 (L) >59 mL/min/1.73 Final    Comment:    **[facility] currently reports eGFR in compliance with the current**   recommendations of the [employer]. [facility] will   update reporting as new guidelines are published from the NKF-ASN   Task force.    GFR, Est Non African American  Date Value Ref Range Status  02/27/2015 61 >=60 mL/min Final    Comment:      The estimated GFR is a calculation valid for adults (>=1 years old) that uses the CKD-EPI algorithm to adjust for age and sex. It is   not to be used for children, pregnant women, hospitalized patients,    patients on dialysis, or with rapidly changing kidney function. According to the NKDEP, eGFR >89 is normal, 60-89 shows mild impairment, 30-59 shows moderate impairment, 15-29 shows severe impairment and <15 is ESRD.      GFR, Estimated  Date Value Ref Range Status  01/07/2022 44 (L) >60 mL/min Final    Comment:     (NOTE) Calculated using the CKD-EPI Creatinine Equation (2021)    eGFR  Date Value Ref Range Status  08/31/2021 50 (L) >59 mL/min/1.73 Final         Passed - HBA1C is between 0 and 7.9 and within 180 days    HbA1c, POC (controlled diabetic range)  Date Value Ref Range Status  01/20/2022 7.1 (A) 0.0 - 7.0 % Final         Passed - Valid encounter within last 6 months    Recent Outpatient Visits           1 week ago Type 2 diabetes mellitus with other circulatory complication, with long-term current use of insulin [facility])   [place], [place], [place]   4 months ago Type 2 diabetes mellitus with other circulatory complication, with long-term current use of insulin ([place])   [place], Charlane Ferretti, MD   8 months ago Encounter for [employer] annual wellness exam   [place], [place], MD   9 months ago Type 2 diabetes mellitus with other circulatory complication, with long-term current use of insulin ([place])   [place], [place], MD   1 year ago Type 2 diabetes mellitus with other circulatory complication, with long-term current use of insulin [facility])   [place], [place], [place]       Future Appointments             In 3 months Charlott Rakes, MD [place]             furosemide (LASIX) 20 MG tablet 360 tablet 3    Sig: Take 2 tablets (40 mg total) by mouth 2 (two) times daily.     Cardiovascular:  Diuretics - Loop Failed - 01/26/2022  5:10 PM      Failed - Cr in normal range and within 180 days    Creat  Date Value Ref Range Status  02/27/2015 1.02 0.50 - 1.05 mg/dL Final   Creatinine, Ser  Date Value Ref Range Status  01/07/2022 1.34 (H) 0.44 - 1.00 mg/dL Final   Creatinine, Urine  Date Value Ref Range Status  06/26/2015 115 20 - 320 mg/dL Final          Failed - Mg Level in normal range and within 180 days    No  results found for: "MG"       Passed - K in normal range and within 180 days    Potassium  Date Value Ref Range Status  01/07/2022 4.3 3.5 - 5.1 mmol/L Final         Passed - Ca in normal range and within 180 days    Calcium  Date Value Ref Range Status  01/07/2022 9.7 8.9 - 10.3 mg/dL Final   Calcium, Ion  Date Value Ref Range Status  04/16/2021 1.26 1.15 - 1.40 mmol/L Final         Passed - Na in normal range and within 180 days    Sodium  Date Value Ref Range Status  01/07/2022 141 135 - 145 mmol/L Final  08/31/2021 142 134 - 144 mmol/L Final         Passed - Cl in normal range and within 180 days    Chloride  Date Value Ref Range Status  01/07/2022 108 98 - 111 mmol/L Final         Passed - Last BP in normal range    BP Readings from Last 1 Encounters:  01/20/22 102/64         Passed - Valid encounter within last 6 months    Recent Outpatient Visits           1 week ago Type 2 diabetes mellitus with other circulatory complication, with long-term current use of insulin [facility])   [facility] [place], [place], [place]   4 months ago Type 2 diabetes mellitus with other circulatory complication, with long-term current use of insulin ([place])   [place], [place], MD   8 months ago Encounter for [employer] annual wellness exam   [place], [place], MD   9 months ago Type 2 diabetes mellitus with other circulatory complication, with long-term current use of insulin ([place])   [place], Charlane Ferretti, MD   1 year ago Type 2 diabetes mellitus with other circulatory complication, with long-term current use of insulin ([place])   [place], [place], [place]       Future Appointments             In 3 months Charlott Rakes, MD [place]             gabapentin (NEURONTIN) 300 MG capsule 450 capsule 1    Sig: Take orally 2 caps (600 mg) in the morning and 3 capsules (900 mg) in the evening     Neurology: Anticonvulsants - gabapentin Failed - 01/26/2022  5:10 PM      Failed - Cr in normal range and within 360 days    Creat  Date Value Ref Range Status  02/27/2015 1.02 0.50 - 1.05 mg/dL Final   Creatinine, Ser  Date Value Ref Range Status  01/07/2022 1.34 (H) 0.44 - 1.00 mg/dL Final   Creatinine, Urine  Date Value Ref Range Status  06/26/2015 115 20 - 320 mg/dL Final         Passed - Completed PHQ-2 or PHQ-9 in the last 360 days      Passed - Valid encounter within last 12 months    Recent Outpatient Visits           1 week ago Type 2 diabetes mellitus with other circulatory complication, with long-term current use of insulin (  [facility])   [place], [place], [place]   4 months ago Type 2 diabetes mellitus with other circulatory complication, with long-term current use of insulin ([place])   [place], Charlane Ferretti, MD   8 months ago Encounter for [employer] annual wellness exam   [place], [place], MD   9 months ago Type 2 diabetes mellitus with other circulatory complication, with long-term current use of insulin ([place])   [place], [place], MD   1 year ago Type 2 diabetes mellitus with other circulatory complication, with long-term current use of insulin [facility])   [place], [place], [place]       Future Appointments             In 3 months Charlott Rakes, MD [place]             lisinopril (ZESTRIL) 20 MG tablet 30 tablet 6    Sig: Take 0.5 tablets (10 mg total) by mouth daily.     Cardiovascular:  ACE Inhibitors Failed - 01/26/2022  5:10 PM      Failed -  Cr in normal range and within 180 days    Creat  Date Value Ref Range Status  02/27/2015 1.02 0.50 - 1.05 mg/dL Final   Creatinine, Ser  Date Value Ref Range Status  01/07/2022 1.34 (H) 0.44 - 1.00 mg/dL Final   Creatinine, Urine  Date Value Ref Range Status  06/26/2015 115 20 - 320 mg/dL Final         Passed - K in normal range and within 180 days    Potassium  Date Value Ref Range Status  01/07/2022 4.3 3.5 - 5.1 mmol/L Final         Passed - Patient is not pregnant      Passed - Last BP in normal range    BP Readings from Last 1 Encounters:  01/20/22 102/64         Passed - Valid encounter within last 6 months    Recent Outpatient Visits           1 week ago Type 2 diabetes mellitus with other circulatory complication, with long-term current use of insulin [facility])   [place], [place], [place]   4 months ago Type 2 diabetes mellitus with other circulatory complication, with long-term current use of insulin ([place])   [place], Charlane Ferretti, MD   8 months ago Encounter for [employer] annual wellness exam   [place], [place], MD   9 months ago Type 2 diabetes mellitus with other circulatory complication, with long-term current use of insulin ([place])   [place], [place], MD   1 year ago Type 2 diabetes mellitus with other circulatory complication, with long-term current use of insulin [facility])   [place], [place], [place]       Future Appointments             In 3 months Charlott Rakes, MD [place]             metFORMIN (GLUCOPHAGE-XR) 500 MG 24 hr tablet 360 tablet 1    Sig: Take 2 tablets (1,000 mg total) by mouth 2 (two) times  daily.     Endocrinology:  Diabetes - Biguanides Failed - 01/26/2022  5:10 PM      Failed - Cr in normal range and  within 360 days    Creat  Date Value Ref Range Status  02/27/2015 1.02 0.50 - 1.05 mg/dL Final   Creatinine, Ser  Date Value Ref Range Status  01/07/2022 1.34 (H) 0.44 - 1.00 mg/dL Final   Creatinine, Urine  Date Value Ref Range Status  06/26/2015 115 20 - 320 mg/dL Final         Failed - eGFR in normal range and within 360 days    GFR, Est African American  Date Value Ref Range Status  02/27/2015 71 >=60 mL/min Final   GFR calc Af Amer  Date Value Ref Range Status  10/04/2019 46 (L) >59 mL/min/1.73 Final    Comment:    **[facility] currently reports eGFR in compliance with the current**   recommendations of the [employer]. [facility] will   update reporting as new guidelines are published from the NKF-ASN   Task force.    GFR, Est Non African American  Date Value Ref Range Status  02/27/2015 61 >=60 mL/min Final    Comment:      The estimated GFR is a calculation valid for adults (>=35 years old) that uses the CKD-EPI algorithm to adjust for age and sex. It is   not to be used for children, pregnant women, hospitalized patients,    patients on dialysis, or with rapidly changing kidney function. According to the NKDEP, eGFR >89 is normal, 60-89 shows mild impairment, 30-59 shows moderate impairment, 15-29 shows severe impairment and <15 is ESRD.      GFR, Estimated  Date Value Ref Range Status  01/07/2022 44 (L) >60 mL/min Final    Comment:    (NOTE) Calculated using the CKD-EPI Creatinine Equation (2021)    eGFR  Date Value Ref Range Status  08/31/2021 50 (L) >59 mL/min/1.73 Final         Failed - B12 Level in normal range and within 720 days    No results found for: "VITAMINB12"       Passed - HBA1C is between 0 and 7.9 and within 180 days    HbA1c, POC (controlled diabetic range)  Date Value Ref Range Status  01/20/2022 7.1 (A) 0.0 - 7.0 % Final         Passed - Valid encounter within last 6 months    Recent Outpatient Visits            1 week ago Type 2 diabetes mellitus with other circulatory complication, with long-term current use of insulin [facility])   [place], [place], [place]   4 months ago Type 2 diabetes mellitus with other circulatory complication, with long-term current use of insulin ([place])   [place], Charlane Ferretti, MD   8 months ago Encounter for [employer] annual wellness exam   [place], [place], MD   9 months ago Type 2 diabetes mellitus with other circulatory complication, with long-term current use of insulin ([place])   [place], [place], MD   1 year ago Type 2 diabetes mellitus with other circulatory complication, with long-term current use of insulin [facility])   [place], Dionne [place], [place]       Future Appointments  In 3 months Charlott Rakes, MD [place] - CBC within normal limits and completed in the last 12 months    WBC  Date Value Ref Range Status  12/18/2021 8.6 4.0 - 10.5 K/uL Final   RBC  Date Value Ref Range Status  12/18/2021 3.98 3.87 - 5.11 MIL/uL Final   Hemoglobin  Date Value Ref Range Status  12/18/2021 10.9 (L) 12.0 - 15.0 g/dL Final  04/10/2021 12.1 11.1 - 15.9 g/dL Final   HCT  Date Value Ref Range Status  12/18/2021 35.5 (L) 36.0 - 46.0 % Final   Hematocrit  Date Value Ref Range Status  04/10/2021 37.0 34.0 - 46.6 % Final   MCHC  Date Value Ref Range Status  12/18/2021 30.7 30.0 - 36.0 g/dL Final   [facility]  Date Value Ref Range Status  12/18/2021 27.4 26.0 - 34.0 pg Final   MCV  Date Value Ref Range Status  12/18/2021 89.2 80.0 - 100.0 fL Final  04/10/2021 83 79 - 97 fL Final   No results found for: "PLTCOUNTKUC", "LABPLAT", "POCPLA" RDW  Date Value Ref Range Status  12/18/2021 16.9 (H) 11.5 - 15.5 % Final   04/10/2021 13.7 11.7 - 15.4 % Final          metoprolol tartrate (LOPRESSOR) 50 MG tablet 180 tablet 1    Sig: Take 1 tablet (50 mg total) by mouth 2 (two) times daily.     Cardiovascular:  Beta Blockers Failed - 01/26/2022  5:10 PM      Failed - Last Heart Rate in normal range    Pulse Readings from Last 1 Encounters:  01/20/22 (!) 111         Passed - Last BP in normal range    BP Readings from Last 1 Encounters:  01/20/22 102/64         Passed - Valid encounter within last 6 months    Recent Outpatient Visits           1 week ago Type 2 diabetes mellitus with other circulatory complication, with long-term current use of insulin [facility])   [place], [place], [place]   4 months ago Type 2 diabetes mellitus with other circulatory complication, with long-term current use of insulin ([place])   [place], Charlane Ferretti, MD   8 months ago Encounter for [employer] annual wellness exam   [place], [place], MD   9 months ago Type 2 diabetes mellitus with other circulatory complication, with long-term current use of insulin ([place])   [place], [place], MD   1 year ago Type 2 diabetes mellitus with other circulatory complication, with long-term current use of insulin [facility])   [place], Dionne [place], [place]       Future Appointments             In 3 months Charlott Rakes, MD [facility] [facility]

## 2022-02-01 ENCOUNTER — Telehealth (HOSPITAL_COMMUNITY): Payer: Self-pay

## 2022-02-01 ENCOUNTER — Telehealth (HOSPITAL_COMMUNITY): Payer: Self-pay | Admitting: *Deleted

## 2022-02-01 NOTE — Telephone Encounter (Signed)
Appointment for Thursday's orientation confirmed. Health history completed.Harrell Gave RN BSN

## 2022-02-04 ENCOUNTER — Encounter (HOSPITAL_COMMUNITY): Payer: Self-pay

## 2022-02-04 ENCOUNTER — Telehealth: Payer: Self-pay | Admitting: Emergency Medicine

## 2022-02-04 ENCOUNTER — Encounter (HOSPITAL_COMMUNITY)
Admission: RE | Admit: 2022-02-04 | Discharge: 2022-02-04 | Disposition: A | Discharge status: Home / Self Care | Payer: No Typology Code available for payment source | Source: Ambulatory Visit | Attending: Internal Medicine | Admitting: Internal Medicine

## 2022-02-04 VITALS — BP 122/68 | HR 91 | Ht 64.75 in | Wt 233.2 lb

## 2022-02-04 DIAGNOSIS — N1832 Chronic kidney disease, stage 3b: Secondary | ICD-10-CM | POA: Insufficient documentation

## 2022-02-04 DIAGNOSIS — I5032 Chronic diastolic (congestive) heart failure: Secondary | ICD-10-CM | POA: Insufficient documentation

## 2022-02-04 DIAGNOSIS — Z5189 Encounter for other specified aftercare: Secondary | ICD-10-CM | POA: Insufficient documentation

## 2022-02-04 DIAGNOSIS — Z7951 Long term (current) use of inhaled steroids: Secondary | ICD-10-CM | POA: Insufficient documentation

## 2022-02-04 DIAGNOSIS — I130 Hypertensive heart and chronic kidney disease with heart failure and stage 1 through stage 4 chronic kidney disease, or unspecified chronic kidney disease: Secondary | ICD-10-CM | POA: Insufficient documentation

## 2022-02-04 DIAGNOSIS — I272 Pulmonary hypertension, unspecified: Secondary | ICD-10-CM | POA: Insufficient documentation

## 2022-02-04 DIAGNOSIS — E119 Type 2 diabetes mellitus without complications: Secondary | ICD-10-CM | POA: Insufficient documentation

## 2022-02-04 DIAGNOSIS — E1122 Type 2 diabetes mellitus with diabetic chronic kidney disease: Secondary | ICD-10-CM | POA: Insufficient documentation

## 2022-02-04 DIAGNOSIS — Z794 Long term (current) use of insulin: Secondary | ICD-10-CM

## 2022-02-04 DIAGNOSIS — I2510 Atherosclerotic heart disease of native coronary artery without angina pectoris: Secondary | ICD-10-CM | POA: Insufficient documentation

## 2022-02-04 LAB — GLUCOSE, CAPILLARY: Glucose-Capillary: 180 mg/dL — ABNORMAL HIGH (ref 70–99)

## 2022-02-04 NOTE — Telephone Encounter (Signed)
Can you send meter that her insurance will cover.

## 2022-02-04 NOTE — Telephone Encounter (Signed)
Copied from [place] [phone]. Topic: General - Other >> Feb 04, 2022  2:23 PM [name] wrote: Reason for CRM: Deborah Coffey with [facility] has called to request a prescription for a new CBG meter for blood sugar checking  The patient has shared with Deborah Coffey that they would like to continue checking their sugar regularly but their meter is currently broken  Please contact the patient further when possible regarding the rx req

## 2022-02-04 NOTE — Progress Notes (Signed)
Cardiac Rehab Medication Review by a Nurse  Does the patient  feel that his/her medications are working for him/her?  YES   Has the patient been experiencing any side effects to the medications prescribed?  NO  Does the patient measure his/her own blood pressure or blood glucose at home?   NO  Does the patient have any problems obtaining medications due to transportation or finances?    NO  Understanding of regimen: fair Understanding of indications: fair Potential of compliance: fair    Nurse  comments: Medications reviewed. [name] is not sure that she is taking farxiga she plans to check and will let me know next week.    Christa See Drew Lips 02/04/2022 2:24 PM

## 2022-02-04 NOTE — Progress Notes (Signed)
Cardiac Individual Treatment Plan  Patient Details  Name: Deborah Coffey MRN: [id number] Date of Birth: November 07, 1955 Referring Provider:   Flowsheet Row INTENSIVE CARDIAC REHAB ORIENT from 02/04/2022 in [facility]  Referring Provider Dr. Glori Bickers, MD       Initial Encounter Date:  [place] from 02/04/2022 in [facility]  Date 02/04/22       Visit Diagnosis: Heart failure, diastolic, chronic ([place])  Patient's Home Medications on Admission:  Current Outpatient Medications:    acetaminophen (TYLENOL) 500 MG tablet, Take 1,000 mg by mouth every 6 (six) hours as needed for headache (pain). , Disp: , Rfl:    aspirin EC 81 MG tablet, Take 81 mg by mouth daily., Disp: , Rfl:    atorvastatin (LIPITOR) 10 MG tablet, Take 1 tablet (10 mg total) by mouth once daily., Disp: 90 tablet, Rfl: 0   clobetasol cream (TEMOVATE) 0.05 %, Apply topically 2 (two) times daily. (Patient taking differently: Apply 1 Application topically daily as needed (Rash).), Disp: 60 g, Rfl: 0   diclofenac Sodium (VOLTAREN) 1 % GEL, Apply 2 g topically daily as needed (muscle pain)., Disp: , Rfl:    furosemide (LASIX) 20 MG tablet, Take 2 tablets (40 mg total) by mouth 2 (two) times daily., Disp: 360 tablet, Rfl: 3   gabapentin (NEURONTIN) 300 MG capsule, Take orally 2 caps (600 mg) in the morning and 3 capsules (900 mg) in the evening, Disp: 450 capsule, Rfl: 1   insulin glargine, 2 Unit Dial, (TOUJEO MAX SOLOSTAR) 300 UNIT/ML Solostar Pen, Inject 55 Units into the skin daily., Disp: 30 mL, Rfl: 6   lisinopril (ZESTRIL) 20 MG tablet, Take 0.5 tablets (10 mg total) by mouth daily., Disp: 30 tablet, Rfl: 6   metFORMIN (GLUCOPHAGE-XR) 500 MG 24 hr tablet, Take 2 tablets (1,000 mg total) by mouth 2 (two) times daily., Disp: 360 tablet, Rfl: 1   metoprolol tartrate (LOPRESSOR) 50 MG tablet, Take 1 tablet (50 mg total)  by mouth 2 (two) times daily., Disp: 180 tablet, Rfl: 1   Multiple Vitamin (MULTIVITAMIN WITH MINERALS) TABS tablet, Take 1 tablet by mouth daily., Disp: , Rfl:    nitroGLYCERIN (NITROSTAT) 0.4 MG SL tablet, Place 1 tablet (0.4 mg total) under the tongue every 5 (five) minutes as needed for chest pain., Disp: 25 tablet, Rfl: 3   pantoprazole (PROTONIX) 40 MG tablet, Take 1 tablet (40 mg total) by mouth 2 (two) times daily., Disp: 180 tablet, Rfl: 0   Alcohol Swabs (B-D SINGLE USE SWABS REGULAR) PADS, USE AS DIRECTED, Disp: 100 each, Rfl: 0   Blood Glucose Monitoring Suppl (ONETOUCH VERIO REFLECT) w/Device KIT, Use as instructed to test blood glucose 3 times daily. (Patient not taking: Reported on 02/04/2022), Disp: 1 kit, Rfl: 0   Continuous Blood Gluc Sensor (DEXCOM G6 SENSOR) MISC, Use to check blood sugar three times daily. Dx. E11.59, Disp: 3 each, Rfl: 3   dapagliflozin propanediol (FARXIGA) 5 MG TABS tablet, Take 1 tablet (5 mg total) by mouth daily before breakfast. (Patient not taking: Reported on 02/03/2022), Disp: 90 tablet, Rfl: 1   glucose blood (ONETOUCH VERIO) test strip, Use as instructed to test blood glucose 3 times daily., Disp: 100 each, Rfl: 2   Insulin Pen Needle (DROPLET PEN NEEDLES) 31G X 8 MM MISC, USE WITH TOUJEO SOLOSTAR PEN EVERY DAY, Disp: 100 each, Rfl: 0   Lancets (ONETOUCH DELICA PLUS [id number]) MISC, Use as  instructed to test blood glucose 3 times daily., Disp: 100 each, Rfl: 2  Past Medical History: Past Medical History:  Diagnosis Date   Allergy    Anemia    CAD in native artery 02/18/2004   Mid LAD lesoin just after Large D1 --> CABG x 1 LIMA-LAD   CAP (community acquired pneumonia) 08/07/2014   Chronic kidney disease, stage 3b ([facility])    Depression    High cholesterol    History of blood transfusion 04/20/2003   "related to [facility]"    Hypertension    Lung nodules    Mild carotid artery disease ([facility])    Pulmonary hypertension ([place])    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 competitive flow   TIA (transient ischemic attack)    Type II diabetes mellitus ([facility])     Tobacco Use: Social History   Tobacco Use  Smoking Status Some Days   Packs/day: 0.25   Years: 42.00   Total pack years: 10.50   Types: Cigarettes  Smokeless Tobacco Never  Tobacco Comments   Smokes 1-3 cigars/ black cigarettes a week. Agreed to referral to [facility] quit line. Referral faxed. Patient given cessation information    Labs: Review Flowsheet  More data exists      Latest Ref Rng & Units 01/21/2021 04/16/2021 04/27/2021 08/31/2021 01/20/2022  Labs for ITP Cardiac and Pulmonary Rehab  Cholestrol 100 - 199 mg/dL 161  - - - -  LDL (calc) 0 - 99 mg/dL 70  - - - -  HDL-C >39 mg/dL 72  - - - -  Trlycerides 0 - 149 mg/dL 104  - - - -  Hemoglobin A1c 0.0 - 7.0 % 9.4  - 7.8  8.2  7.1   PH, Arterial 7.350 - 7.450 - 7.355  - - -  PCO2 arterial 32.0 - 48.0 mmHg - 39.7  - - -  Bicarbonate 20.0 - 28.0 mmol/L - 22.2  23.6  25.4  - - -  TCO2 22 - 32 mmol/L - $Remove'23  25  27  'XfLqniy$ - - -  Acid-base deficit 0.0 - 2.0 mmol/L - 3.0  2.0  - - -  O2 Saturation % - 97.0  70.0  72.0  - - -    Capillary Blood Glucose: Lab Results  Component Value Date   GLUCAP 180 (H) 02/04/2022   GLUCAP 80 09/10/2021   GLUCAP 97 01/23/2020   GLUCAP 177 (H) 09/03/2016   GLUCAP 310 (H) 09/03/2016     Exercise Target Goals: Exercise Program Goal: Individual exercise prescription set using results from initial 6 min walk test and THRR while considering  patient's activity barriers and safety.   Exercise Prescription Goal: Initial exercise prescription builds to 30-45 minutes a day of aerobic activity, 2-3 days per week.  Home exercise guidelines will be given to patient during program as part of exercise prescription that the participant will acknowledge.  Activity Barriers & Risk Stratification:  Activity Barriers & Cardiac Risk Stratification - 02/04/22 1123        Activity Barriers & Cardiac Risk Stratification   Activity Barriers Decreased Ventricular Function;Back Problems;Balance Concerns;Deconditioning;History of Falls;Assistive Device;Shortness of Breath    Cardiac Risk Stratification High             6 Minute Walk:  6 Minute Walk     Row Name 02/04/22 1113         6 Minute Walk   Phase  Initial  Pt uses cane and did not bring it, Nustep test due to balance issues and previous falls     Distance 1017 feet  Nustep test     Walk Time 6 minutes     # of Rest Breaks 1  4.55-5.21 rest break due to fatigue and SOB     MPH 1.93     METS 1.79     RPE 11     Perceived Dyspnea  1     VO2 Peak 6.27     Symptoms Yes (comment)     Comments Rest break 4.55-5.21 due to fatigue and SOB (Dys 1) Nustep test info: AVG SPM: 65, AVG WATTS: 12, Total steps: 356, Dist Km: 0.31, AVG MET's: 1.4     Resting HR 91 bpm     Resting BP 122/68     Resting Oxygen Saturation  98 %     Exercise Oxygen Saturation  during 6 min walk 97 %     Max Ex. HR 95 bpm     Max Ex. BP 107/70     2 Minute Post BP 118/74              Oxygen Initial Assessment:   Oxygen Re-Evaluation:   Oxygen Discharge (Final Oxygen Re-Evaluation):   Initial Exercise Prescription:  Initial Exercise Prescription - 02/04/22 1100       Date of Initial Exercise RX and Referring Provider   Date 02/04/22    Referring Provider Dr. Glori Bickers, MD    Expected Discharge Date 04/02/22      NuStep   Level 1    SPM 60    Minutes 20    METs 1.6      Prescription Details   Frequency (times per week) 3    Duration Progress to 30 minutes of continuous aerobic without signs/symptoms of physical distress      Intensity   THRR 40-80% of Max Heartrate 62-125    Ratings of Perceived Exertion 11-13    Perceived Dyspnea 0-4      Progression   Progression Continue progressive overload as per policy without signs/symptoms or physical distress.      Resistance Training    Training Prescription Yes    Weight 2 lbs    Reps 10-15             Perform Capillary Blood Glucose checks as needed.  Exercise Prescription Changes:   Exercise Comments:   Exercise Goals and Review:   Exercise Goals     Row Name 02/04/22 1125             Exercise Goals   Increase Physical Activity Yes       Intervention Provide advice, education, support and counseling about physical activity/exercise needs.;Develop an individualized exercise prescription for aerobic and resistive training based on initial evaluation findings, risk stratification, comorbidities and participant's personal goals.       Expected Outcomes Short Term: Attend rehab on a regular basis to increase amount of physical activity.;Long Term: Add in home exercise to make exercise part of routine and to increase amount of physical activity.;Long Term: Exercising regularly at least 3-5 days a week.       Able to understand and use rate of perceived exertion (RPE) scale Yes       Intervention Provide education and explanation on how to use RPE scale       Expected Outcomes Short Term: Able to use RPE daily in rehab to express subjective  intensity level;Long Term:  Able to use RPE to guide intensity level when exercising independently       Able to understand and use Dyspnea scale Yes       Intervention Provide education and explanation on how to use Dyspnea scale       Expected Outcomes Short Term: Able to use Dyspnea scale daily in rehab to express subjective sense of shortness of breath during exertion;Long Term: Able to use Dyspnea scale to guide intensity level when exercising independently       Knowledge and understanding of Target Heart Rate Range (THRR) Yes       Intervention Provide education and explanation of THRR including how the numbers were predicted and where they are located for reference       Expected Outcomes Short Term: Able to state/look up THRR;Long Term: Able to use THRR to govern  intensity when exercising independently;Short Term: Able to use daily as guideline for intensity in rehab       Understanding of Exercise Prescription Yes       Intervention Provide education, explanation, and written materials on patient's individual exercise prescription       Expected Outcomes Short Term: Able to explain program exercise prescription;Long Term: Able to explain home exercise prescription to exercise independently                Exercise Goals Re-Evaluation :   Discharge Exercise Prescription (Final Exercise Prescription Changes):   Nutrition:  Target Goals: Understanding of nutrition guidelines, daily intake of sodium '1500mg'$ , cholesterol '200mg'$ , calories 30% from fat and 7% or less from saturated fats, daily to have 5 or more servings of fruits and vegetables.  Biometrics:  Pre Biometrics - 02/04/22 1112       Pre Biometrics   Waist Circumference 50 inches    Hip Circumference 52 inches    Waist to Hip Ratio 0.96 %    Triceps Skinfold 30 mm    % Body Fat 50.4 %    Grip Strength 26 kg    Flexibility 10 in    Single Leg Stand 1.6 seconds              Nutrition Therapy Plan and Nutrition Goals:   Nutrition Assessments:  MEDIFICTS Score Key: ?70 Need to make dietary changes  40-70 Heart Healthy Diet ? 40 Therapeutic Level Cholesterol Diet    Picture Your Plate Scores: <68 Unhealthy dietary pattern with much room for improvement. 41-50 Dietary pattern unlikely to meet recommendations for good health and room for improvement. 51-60 More healthful dietary pattern, with some room for improvement.  >60 Healthy dietary pattern, although there may be some specific behaviors that could be improved.    Nutrition Goals Re-Evaluation:   Nutrition Goals Re-Evaluation:   Nutrition Goals Discharge (Final Nutrition Goals Re-Evaluation):   Psychosocial: Target Goals: Acknowledge presence or absence of significant depression and/or stress, maximize  coping skills, provide positive support system. Participant is able to verbalize types and ability to use techniques and skills needed for reducing stress and depression.  Initial Review & Psychosocial Screening:  Initial Psych Review & Screening - 02/04/22 1056       Initial Review   Current issues with History of Depression;Current Depression   Gwen says she does feel depressed at times denies being depressed currently     [place]? Yes   Gwen lives with her daughter and 3 grandchildren who she has for support   Comments  Gwen denies being depressed currently. Gwen does not think that she needs couselling presently      Barriers   Psychosocial barriers to participate in program The patient should benefit from training in stress management and relaxation.      Screening Interventions   Interventions Encouraged to exercise;Provide feedback about the scores to participant    Expected Outcomes Long Term Goal: Stressors or current issues are controlled or eliminated.;Short Term goal: Identification and review with participant of any Quality of Life or Depression concerns found by scoring the questionnaire.;Long Term goal: The participant improves quality of Life and PHQ9 Scores as seen by post scores and/or verbalization of changes             Quality of Life Scores:  Quality of Life - 02/04/22 1136       Quality of Life   Select Quality of Life      Quality of Life Scores   Health/Function Pre 18.46 %    Socioeconomic Pre 20.17 %    Psych/Spiritual Pre 22 %    Family Pre 22.88 %    GLOBAL Pre 20.34 %            Scores of 19 and below usually indicate a poorer quality of life in these areas.  A difference of  2-3 points is a clinically meaningful difference.  A difference of 2-3 points in the total score of the Quality of Life Index has been associated with significant improvement in overall quality of life, self-image, physical symptoms, and  general health in studies assessing change in quality of life.  PHQ-9: Review Flowsheet  More data exists      02/04/2022 01/20/2022 09/04/2021 06/01/2021 04/27/2021  Depression screen PHQ 2/9  Decreased Interest 1 0 0 0 0  Down, Depressed, Hopeless 0 0 0 0 0  PHQ - 2 Score 1 0 0 0 0  Altered sleeping 0 - 0 - 0  Tired, decreased energy 1 - 0 - 0  Change in appetite 1 - 0 - 0  Feeling bad or failure about yourself  0 - 0 - 0  Trouble concentrating 1 - 0 - 0  Moving slowly or fidgety/restless 0 - 0 - 0  Suicidal thoughts 0 - 0 - 0  PHQ-9 Score 4 - 0 - 0  Difficult doing work/chores Not difficult at all - Not difficult at all - -   Interpretation of Total Score  Total Score Depression Severity:  1-4 = Minimal depression, 5-9 = Mild depression, 10-14 = Moderate depression, 15-19 = Moderately severe depression, 20-27 = Severe depression   Psychosocial Evaluation and Intervention:   Psychosocial Re-Evaluation:   Psychosocial Discharge (Final Psychosocial Re-Evaluation):   Vocational Rehabilitation: Provide vocational rehab assistance to qualifying candidates.   Vocational Rehab Evaluation & Intervention:  Vocational Rehab - 02/04/22 1559       Initial Vocational Rehab Evaluation & Intervention   Assessment shows need for Vocational Rehabilitation No   [name] is disabled and does not need vocational rehab at this time            Education: Education Goals: Education classes will be provided on a weekly basis, covering required topics. Participant will state understanding/return demonstration of topics presented.     Core Videos: Exercise    Move It!  Clinical staff conducted group or individual video education with verbal and written material and guidebook.  Patient learns the recommended Pritikin exercise program. Exercise with the goal of living a long,  healthy life. Some of the health benefits of exercise include controlled diabetes, healthier blood pressure levels,  improved cholesterol levels, improved heart and lung capacity, improved sleep, and better body composition. Everyone should speak with their doctor before starting or changing an exercise routine.  Biomechanical Limitations Clinical staff conducted group or individual video education with verbal and written material and guidebook.  Patient learns how biomechanical limitations can impact exercise and how we can mitigate and possibly overcome limitations to have an impactful and balanced exercise routine.  Body Composition Clinical staff conducted group or individual video education with verbal and written material and guidebook.  Patient learns that body composition (ratio of muscle mass to fat mass) is a key component to assessing overall fitness, rather than body weight alone. Increased fat mass, especially visceral belly fat, can put [place] at increased risk for metabolic syndrome, type 2 diabetes, heart disease, and even death. It is recommended to combine diet and exercise (cardiovascular and resistance training) to improve your body composition. Seek guidance from your physician and exercise physiologist before implementing an exercise routine.  Exercise Action Plan Clinical staff conducted group or individual video education with verbal and written material and guidebook.  Patient learns the recommended strategies to achieve and enjoy long-term exercise adherence, including variety, self-motivation, self-efficacy, and positive decision making. Benefits of exercise include fitness, good health, weight management, more energy, better sleep, less stress, and overall well-being.  Medical   Heart Disease Risk Reduction Clinical staff conducted group or individual video education with verbal and written material and guidebook.  Patient learns our heart is our most vital organ as it circulates oxygen, nutrients, white blood cells, and hormones throughout the entire body, and carries waste away. Data  supports a plant-based eating plan like the Pritikin Program for its effectiveness in slowing progression of and reversing heart disease. The video provides a number of recommendations to address heart disease.   Metabolic Syndrome and Belly Fat  Clinical staff conducted group or individual video education with verbal and written material and guidebook.  Patient learns what metabolic syndrome is, how it leads to heart disease, and how one can reverse it and keep it from coming back. You have metabolic syndrome if you have 3 of the following 5 criteria: abdominal obesity, high blood pressure, high triglycerides, low HDL cholesterol, and high blood sugar.  Hypertension and Heart Disease Clinical staff conducted group or individual video education with verbal and written material and guidebook.  Patient learns that high blood pressure, or hypertension, is very common in the [place]. Hypertension is largely due to excessive salt intake, but other important risk factors include being overweight, physical inactivity, drinking too much alcohol, smoking, and not eating enough potassium from fruits and vegetables. High blood pressure is a leading risk factor for heart attack, stroke, congestive heart failure, dementia, kidney failure, and premature death. Long-term effects of excessive salt intake include stiffening of the arteries and thickening of heart muscle and organ damage. Recommendations include ways to reduce hypertension and the risk of heart disease.  Diseases of Our Time - Focusing on Diabetes Clinical staff conducted group or individual video education with verbal and written material and guidebook.  Patient learns why the best way to stop diseases of our time is prevention, through food and other lifestyle changes. Medicine (such as prescription pills and surgeries) is often only a Band-Aid on the problem, not a long-term solution. Most common diseases of our time include obesity, type 2  diabetes, hypertension,  heart disease, and cancer. The Pritikin Program is recommended and has been proven to help reduce, reverse, and/or prevent the damaging effects of metabolic syndrome.  Nutrition   Overview of the Pritikin Eating Plan  Clinical staff conducted group or individual video education with verbal and written material and guidebook.  Patient learns about the [place] for disease risk reduction. The [place] emphasizes a wide variety of unrefined, minimally-processed carbohydrates, like fruits, vegetables, whole grains, and legumes. Go, Caution, and Stop food choices are explained. Plant-based and lean animal proteins are emphasized. Rationale provided for low sodium intake for blood pressure control, low added sugars for blood sugar stabilization, and low added fats and oils for coronary artery disease risk reduction and weight management.  Calorie Density  Clinical staff conducted group or individual video education with verbal and written material and guidebook.  Patient learns about calorie density and how it impacts the Pritikin Eating Plan. Knowing the characteristics of the food you choose will help you decide whether those foods will lead to weight gain or weight loss, and whether you want to consume more or less of them. Weight loss is usually a side effect of the Pritikin Eating Plan because of its focus on low calorie-dense foods.  Label Reading  Clinical staff conducted group or individual video education with verbal and written material and guidebook.  Patient learns about the Pritikin recommended label reading guidelines and corresponding recommendations regarding calorie density, added sugars, sodium content, and whole grains.  Dining Out - Part 1  Clinical staff conducted group or individual video education with verbal and written material and guidebook.  Patient learns that restaurant meals can be sabotaging because they can be so high in  calories, fat, sodium, and/or sugar. Patient learns recommended strategies on how to positively address this and avoid unhealthy pitfalls.  Facts on Fats  Clinical staff conducted group or individual video education with verbal and written material and guidebook.  Patient learns that lifestyle modifications can be just as effective, if not more so, as many medications for lowering your risk of heart disease. A Pritikin lifestyle can help to reduce your risk of inflammation and atherosclerosis (cholesterol build-up, or plaque, in the artery walls). Lifestyle interventions such as dietary choices and physical activity address the cause of atherosclerosis. A review of the types of fats and their impact on blood cholesterol levels, along with dietary recommendations to reduce fat intake is also included.  Nutrition Action Plan  Clinical staff conducted group or individual video education with verbal and written material and guidebook.  Patient learns how to incorporate Pritikin recommendations into their lifestyle. Recommendations include planning and keeping personal health goals in mind as an important part of their success.  Healthy Mind-Set    Healthy Minds, Bodies, Hearts  Clinical staff conducted group or individual video education with verbal and written material and guidebook.  Patient learns how to identify when they are stressed. Video will discuss the impact of that stress, as well as the many benefits of stress management. Patient will also be introduced to stress management techniques. The way we think, act, and feel has an impact on our hearts.  How Our Thoughts Can Heal Our Hearts  Clinical staff conducted group or individual video education with verbal and written material and guidebook.  Patient learns that negative thoughts can cause depression and anxiety. This can result in negative lifestyle behavior and serious health problems. Cognitive behavioral therapy is an effective method to  help  control our thoughts in order to change and improve our emotional outlook.  Additional Videos:  Exercise    Improving Performance  Clinical staff conducted group or individual video education with verbal and written material and guidebook.  Patient learns to use a non-linear approach by alternating intensity levels and lengths of time spent exercising to help burn more calories and lose more body fat. Cardiovascular exercise helps improve heart health, metabolism, hormonal balance, blood sugar control, and recovery from fatigue. Resistance training improves strength, endurance, balance, coordination, reaction time, metabolism, and muscle mass. Flexibility exercise improves circulation, posture, and balance. Seek guidance from your physician and exercise physiologist before implementing an exercise routine and learn your capabilities and proper form for all exercise.  Introduction to Yoga  Clinical staff conducted group or individual video education with verbal and written material and guidebook.  Patient learns about yoga, a discipline of the coming together of mind, breath, and body. The benefits of yoga include improved flexibility, improved range of motion, better posture and core strength, increased lung function, weight loss, and positive self-image. Yoga's heart health benefits include lowered blood pressure, healthier heart rate, decreased cholesterol and triglyceride levels, improved immune function, and reduced stress. Seek guidance from your physician and exercise physiologist before implementing an exercise routine and learn your capabilities and proper form for all exercise.  Medical   Aging: Enhancing Your Quality of Life  Clinical staff conducted group or individual video education with verbal and written material and guidebook.  Patient learns key strategies and recommendations to stay in good physical health and enhance quality of life, such as prevention strategies, having an  advocate, securing a [place], and keeping a list of medications and system for tracking them. It also discusses how to avoid risk for bone loss.  Biology of Weight Control  Clinical staff conducted group or individual video education with verbal and written material and guidebook.  Patient learns that weight gain occurs because we consume more calories than we burn (eating more, moving less). Even if your body weight is normal, you may have higher ratios of fat compared to muscle mass. Too much body fat puts you at increased risk for cardiovascular disease, heart attack, stroke, type 2 diabetes, and obesity-related cancers. In addition to exercise, following the [place] can help reduce your risk.  Decoding Lab Results  Clinical staff conducted group or individual video education with verbal and written material and guidebook.  Patient learns that lab test reflects one measurement whose values change over time and are influenced by many factors, including medication, stress, sleep, exercise, food, hydration, pre-existing medical conditions, and more. It is recommended to use the knowledge from this video to become more involved with your lab results and evaluate your numbers to speak with your doctor.   Diseases of Our Time - Overview  Clinical staff conducted group or individual video education with verbal and written material and guidebook.  Patient learns that according to the CDC, 50% to 70% of chronic diseases (such as obesity, type 2 diabetes, elevated lipids, hypertension, and heart disease) are avoidable through lifestyle improvements including healthier food choices, listening to satiety cues, and increased physical activity.  Sleep Disorders Clinical staff conducted group or individual video education with verbal and written material and guidebook.  Patient learns how good quality and duration of sleep are important to overall health and  well-being. Patient also learns about sleep disorders and how they impact health along with  recommendations to address them, including discussing with a physician.  Nutrition  Dining Out - Part 2 Clinical staff conducted group or individual video education with verbal and written material and guidebook.  Patient learns how to plan ahead and communicate in order to maximize their dining experience in a healthy and nutritious manner. Included are recommended food choices based on the type of restaurant the patient is visiting.   Fueling a Best boy conducted group or individual video education with verbal and written material and guidebook.  There is a strong connection between our food choices and our health. Diseases like obesity and type 2 diabetes are very prevalent and are in large-part due to lifestyle choices. The Pritikin Eating Plan provides plenty of food and hunger-curbing satisfaction. It is easy to follow, affordable, and helps reduce health risks.  Menu Workshop  Clinical staff conducted group or individual video education with verbal and written material and guidebook.  Patient learns that restaurant meals can sabotage health goals because they are often packed with calories, fat, sodium, and sugar. Recommendations include strategies to plan ahead and to communicate with the manager, chef, or server to help order a healthier meal.  Planning Your Eating Strategy  Clinical staff conducted group or individual video education with verbal and written material and guidebook.  Patient learns about the [place] and its benefit of reducing the risk of disease. The [place] does not focus on calories. Instead, it emphasizes high-quality, nutrient-rich foods. By knowing the characteristics of the foods, we choose, we can determine their calorie density and make informed decisions.  Targeting Your Nutrition Priorities  Clinical staff conducted group or  individual video education with verbal and written material and guidebook.  Patient learns that lifestyle habits have a tremendous impact on disease risk and progression. This video provides eating and physical activity recommendations based on your personal health goals, such as reducing LDL cholesterol, losing weight, preventing or controlling type 2 diabetes, and reducing high blood pressure.  Vitamins and Minerals  Clinical staff conducted group or individual video education with verbal and written material and guidebook.  Patient learns different ways to obtain key vitamins and minerals, including through a recommended healthy diet. It is important to discuss all supplements you take with your doctor.   Healthy Mind-Set    Smoking Cessation  Clinical staff conducted group or individual video education with verbal and written material and guidebook.  Patient learns that cigarette smoking and tobacco addiction pose a serious health risk which affects millions of people. Stopping smoking will significantly reduce the risk of heart disease, lung disease, and many forms of cancer. Recommended strategies for quitting are covered, including working with your doctor to develop a successful plan.  Culinary   Becoming a Financial trader conducted group or individual video education with verbal and written material and guidebook.  Patient learns that cooking at home can be healthy, cost-effective, quick, and puts them in control. Keys to cooking healthy recipes will include looking at your recipe, assessing your equipment needs, planning ahead, making it simple, choosing cost-effective seasonal ingredients, and limiting the use of added fats, salts, and sugars.  Cooking - Breakfast and Snacks  Clinical staff conducted group or individual video education with verbal and written material and guidebook.  Patient learns how important breakfast is to satiety and nutrition through the entire  day. Recommendations include key foods to eat during breakfast to help stabilize blood sugar levels and  to prevent overeating at meals later in the day. Planning ahead is also a key component.  Cooking - Human resources officer conducted group or individual video education with verbal and written material and guidebook.  Patient learns eating strategies to improve overall health, including an approach to cook more at home. Recommendations include thinking of animal protein as a side on your plate rather than center stage and focusing instead on lower calorie dense options like vegetables, fruits, whole grains, and plant-based proteins, such as beans. Making sauces in large quantities to freeze for later and leaving the skin on your vegetables are also recommended to maximize your experience.  Cooking - Healthy Salads and Dressing Clinical staff conducted group or individual video education with verbal and written material and guidebook.  Patient learns that vegetables, fruits, whole grains, and legumes are the foundations of the [place]. Recommendations include how to incorporate each of these in flavorful and healthy salads, and how to create homemade salad dressings. Proper handling of ingredients is also covered. Cooking - Soups and [employer] - Soups and Desserts Clinical staff conducted group or individual video education with verbal and written material and guidebook.  Patient learns that Pritikin soups and desserts make for easy, nutritious, and delicious snacks and meal components that are low in sodium, fat, sugar, and calorie density, while high in vitamins, minerals, and filling fiber. Recommendations include simple and healthy ideas for soups and desserts.   Overview     The Pritikin Solution Program Overview Clinical staff conducted group or individual video education with verbal and written material and guidebook.  Patient learns that the results of the  [place] Program have been documented in more than 100 articles published in peer-reviewed journals, and the benefits include reducing risk factors for (and, in some cases, even reversing) high cholesterol, high blood pressure, type 2 diabetes, obesity, and more! An overview of the three key pillars of the Pritikin Program will be covered: eating well, doing regular exercise, and having a healthy mind-set.  WORKSHOPS  Exercise: Exercise Basics: Building Your Action Plan Clinical staff led group instruction and group discussion with PowerPoint presentation and patient guidebook. To enhance the learning environment the use of posters, models and videos may be added. At the conclusion of this workshop, patients will comprehend the difference between physical activity and exercise, as well as the benefits of incorporating both, into their routine. Patients will understand the FITT (Frequency, Intensity, Time, and Type) principle and how to use it to build an exercise action plan. In addition, safety concerns and other considerations for exercise and cardiac rehab will be addressed by the presenter. The purpose of this lesson is to promote a comprehensive and effective weekly exercise routine in order to improve patients' overall level of fitness.   Managing Heart Disease: Your Path to a Healthier Heart Clinical staff led group instruction and group discussion with PowerPoint presentation and patient guidebook. To enhance the learning environment the use of posters, models and videos may be added.At the conclusion of this workshop, patients will understand the anatomy and physiology of the heart. Additionally, they will understand how Pritikin's three pillars impact the risk factors, the progression, and the management of heart disease.  The purpose of this lesson is to provide a high-level overview of the heart, heart disease, and how the Pritikin lifestyle positively impacts risk factors.  Exercise  Biomechanics Clinical staff led group instruction and group discussion with PowerPoint presentation and patient guidebook.  To enhance the learning environment the use of posters, models and videos may be added. Patients will learn how the structural parts of their bodies function and how these functions impact their daily activities, movement, and exercise. Patients will learn how to promote a neutral spine, learn how to manage pain, and identify ways to improve their physical movement in order to promote healthy living. The purpose of this lesson is to expose patients to common physical limitations that impact physical activity. Participants will learn practical ways to adapt and manage aches and pains, and to minimize their effect on regular exercise. Patients will learn how to maintain good posture while sitting, walking, and lifting.  Balance Training and Fall Prevention  Clinical staff led group instruction and group discussion with PowerPoint presentation and patient guidebook. To enhance the learning environment the use of posters, models and videos may be added. At the conclusion of this workshop, patients will understand the importance of their sensorimotor skills (vision, proprioception, and the vestibular system) in maintaining their ability to balance as they age. Patients will apply a variety of balancing exercises that are appropriate for their current level of function. Patients will understand the common causes for poor balance, possible solutions to these problems, and ways to modify their physical environment in order to minimize their fall risk. The purpose of this lesson is to teach patients about the importance of maintaining balance as they age and ways to minimize their risk of falling.  WORKSHOPS   Nutrition:  Fueling a Scientist, research (physical sciences) led group instruction and group discussion with PowerPoint presentation and patient guidebook. To enhance the learning  environment the use of posters, models and videos may be added. Patients will review the foundational principles of the [place] and understand what constitutes a serving size in each of the food groups. Patients will also learn Pritikin-friendly foods that are better choices when away from home and review make-ahead meal and snack options. Calorie density will be reviewed and applied to three nutrition priorities: weight maintenance, weight loss, and weight gain. The purpose of this lesson is to reinforce (in a group setting) the key concepts around what patients are recommended to eat and how to apply these guidelines when away from home by planning and selecting Pritikin-friendly options. Patients will understand how calorie density may be adjusted for different weight management goals.  Mindful Eating  Clinical staff led group instruction and group discussion with PowerPoint presentation and patient guidebook. To enhance the learning environment the use of posters, models and videos may be added. Patients will briefly review the concepts of the [place] and the importance of low-calorie dense foods. The concept of mindful eating will be introduced as well as the importance of paying attention to internal hunger signals. Triggers for non-hunger eating and techniques for dealing with triggers will be explored. The purpose of this lesson is to provide patients with the opportunity to review the basic principles of the [place], discuss the value of eating mindfully and how to measure internal cues of hunger and fullness using the Hunger Scale. Patients will also discuss reasons for non-hunger eating and learn strategies to use for controlling emotional eating.  Targeting Your Nutrition Priorities Clinical staff led group instruction and group discussion with PowerPoint presentation and patient guidebook. To enhance the learning environment the use of posters, models and  videos may be added. Patients will learn how to determine their genetic susceptibility to disease by reviewing their family  history. Patients will gain insight into the importance of diet as part of an overall healthy lifestyle in mitigating the impact of genetics and other environmental insults. The purpose of this lesson is to provide patients with the opportunity to assess their personal nutrition priorities by looking at their family history, their own health history and current risk factors. Patients will also be able to discuss ways of prioritizing and modifying the [place] for their highest risk areas  Menu  Clinical staff led group instruction and group discussion with PowerPoint presentation and patient guidebook. To enhance the learning environment the use of posters, models and videos may be added. Using menus brought in from [employer], or printed from [employer], patients will apply the [place] dining out guidelines that were presented in the [employer] video. Patients will also be able to practice these guidelines in a variety of provided scenarios. The purpose of this lesson is to provide patients with the opportunity to practice hands-on learning of the [place] with actual menus and practice scenarios.  Label Reading Clinical staff led group instruction and group discussion with PowerPoint presentation and patient guidebook. To enhance the learning environment the use of posters, models and videos may be added. Patients will review and discuss the Pritikin label reading guidelines presented in Pritikin's Label Reading Educational series video. Using fool labels brought in from local grocery stores and markets, patients will apply the label reading guidelines and determine if the packaged food meet the Pritikin guidelines. The purpose of this lesson is to provide patients with the opportunity to review, discuss, and practice  hands-on learning of the Pritikin Label Reading guidelines with actual packaged food labels. [place] Workshops are designed to teach patients ways to prepare quick, simple, and affordable recipes at home. The importance of nutrition's role in chronic disease risk reduction is reflected in its emphasis in the overall Pritikin program. By learning how to prepare essential core Pritikin Eating Plan recipes, patients will increase control over what they eat; be able to customize the flavor of foods without the use of added salt, sugar, or fat; and improve the quality of the food they consume. By learning a set of core recipes which are easily assembled, quickly prepared, and affordable, patients are more likely to prepare more healthy foods at home. These workshops focus on convenient breakfasts, simple entres, side dishes, and desserts which can be prepared with minimal effort and are consistent with nutrition recommendations for cardiovascular risk reduction. Cooking [employer] are taught by a Engineer, materials (RD) who has been trained by the [employer]. The chef or RD has a clear understanding of the importance of minimizing - if not completely eliminating - added fat, sugar, and sodium in recipes. Throughout the series of [place] Workshop sessions, patients will learn about healthy ingredients and efficient methods of cooking to build confidence in their capability to prepare    Cooking School weekly topics:  Adding Flavor- Sodium-Free  Fast and Healthy Breakfasts  Powerhouse Plant-Based Proteins  Satisfying Salads and Dressings  Simple Sides and Sauces  International Cuisine-Spotlight on the [place] Zones  Delicious Desserts  Savory Soups  Efficiency Cooking - Meals in a Agricultural consultant Appetizers and Snacks  Comforting Weekend Breakfasts  One-Pot Wonders   Fast Evening Meals  Contractor Your Pritikin  Plate  WORKSHOPS   Healthy Mindset (Psychosocial): New Thoughts, New Behaviors Clinical  staff led group instruction and group discussion with PowerPoint presentation and patient guidebook. To enhance the learning environment the use of posters, models and videos may be added. Patients will learn and practice techniques for developing effective health and lifestyle goals. Patients will be able to effectively apply the goal setting process learned to develop at least one new personal goal.  The purpose of this lesson is to expose patients to a new skill set of behavior modification techniques such as techniques setting SMART goals, overcoming barriers, and achieving new thoughts and new behaviors.  Managing Moods and Relationships Clinical staff led group instruction and group discussion with PowerPoint presentation and patient guidebook. To enhance the learning environment the use of posters, models and videos may be added. Patients will learn how emotional and chronic stress factors can impact their health and relationships. They will learn healthy ways to manage their moods and utilize positive coping mechanisms. In addition, ICR patients will learn ways to improve communication skills. The purpose of this lesson is to expose patients to ways of understanding how one's mood and health are intimately connected. Developing a healthy outlook can help build positive relationships and connections with others. Patients will understand the importance of utilizing effective communication skills that include actively listening and being heard. They will learn and understand the importance of the "4 Cs" and especially Connections in fostering of a Healthy Mind-Set.  Healthy Sleep for a Healthy Heart Clinical staff led group instruction and group discussion with PowerPoint presentation and patient guidebook. To enhance the learning environment the use of posters, models and videos may be added. At the conclusion  of this workshop, patients will be able to demonstrate knowledge of the importance of sleep to overall health, well-being, and quality of life. They will understand the symptoms of, and treatments for, common sleep disorders. Patients will also be able to identify daytime and nighttime behaviors which impact sleep, and they will be able to apply these tools to help manage sleep-related challenges. The purpose of this lesson is to provide patients with a general overview of sleep and outline the importance of quality sleep. Patients will learn about a few of the most common sleep disorders. Patients will also be introduced to the concept of "sleep hygiene," and discover ways to self-manage certain sleeping problems through simple daily behavior changes. Finally, the workshop will motivate patients by clarifying the links between quality sleep and their goals of heart-healthy living.   Recognizing and Reducing Stress Clinical staff led group instruction and group discussion with PowerPoint presentation and patient guidebook. To enhance the learning environment the use of posters, models and videos may be added. At the conclusion of this workshop, patients will be able to understand the types of stress reactions, differentiate between acute and chronic stress, and recognize the impact that chronic stress has on their health. They will also be able to apply different coping mechanisms, such as reframing negative self-talk. Patients will have the opportunity to practice a variety of stress management techniques, such as deep abdominal breathing, progressive muscle relaxation, and/or guided imagery.  The purpose of this lesson is to educate patients on the role of stress in their lives and to provide healthy techniques for coping with it.  Learning Barriers/Preferences:  Learning Barriers/Preferences - 02/04/22 1136       Learning Barriers/Preferences   Learning Barriers Sight;Exercise Concerns   Pt wears  glasses, balance concerns, previous falls   Learning Preferences Audio;Group Instruction;Individual Instruction;Pictoral;Skilled Demonstration;Verbal Instruction  Education Topics:  Knowledge Questionnaire Score:  Knowledge Questionnaire Score - 02/04/22 1137       Knowledge Questionnaire Score   Pre Score 19/28             Core Components/Risk Factors/Patient Goals at Admission:  Personal Goals and Risk Factors at Admission - 02/04/22 1140       Core Components/Risk Factors/Patient Goals on Admission    Weight Management Yes;Obesity;Weight Loss    Intervention Weight Management: Develop a combined nutrition and exercise program designed to reach desired caloric intake, while maintaining appropriate intake of nutrient and fiber, sodium and fats, and appropriate energy expenditure required for the weight goal.;Weight Management: Provide education and appropriate resources to help participant work on and attain dietary goals.;Weight Management/Obesity: Establish reasonable short term and long term weight goals.;Obesity: Provide education and appropriate resources to help participant work on and attain dietary goals.    Admit Weight 233 lb 4 oz (105.8 kg)    Expected Outcomes Short Term: Continue to assess and modify interventions until short term weight is achieved;Long Term: Adherence to nutrition and physical activity/exercise program aimed toward attainment of established weight goal;Weight Loss: Understanding of general recommendations for a balanced deficit meal plan, which promotes 1-2 lb weight loss per week and includes a negative energy balance of [phone] kcal/d;Understanding recommendations for meals to include 15-35% energy as protein, 25-35% energy from fat, 35-60% energy from carbohydrates, less than $RemoveB'200mg'[id number]$  of dietary cholesterol, 20-35 gm of total fiber daily;Understanding of distribution of calorie intake throughout the day with the consumption of 4-5  meals/snacks    Diabetes Yes    Intervention Provide education about signs/symptoms and action to take for hypo/hyperglycemia.;Provide education about proper nutrition, including hydration, and aerobic/resistive exercise prescription along with prescribed medications to achieve blood glucose in normal ranges: Fasting glucose 65-99 mg/dL    Expected Outcomes Short Term: Participant verbalizes understanding of the signs/symptoms and immediate care of hyper/hypoglycemia, proper foot care and importance of medication, aerobic/resistive exercise and nutrition plan for blood glucose control.;Long Term: Attainment of HbA1C < 7%.    Heart Failure Yes    Intervention Provide a combined exercise and nutrition program that is supplemented with education, support and counseling about heart failure. Directed toward relieving symptoms such as shortness of breath, decreased exercise tolerance, and extremity edema.    Expected Outcomes Improve functional capacity of life;Short term: Attendance in program 2-3 days a week with increased exercise capacity. Reported lower sodium intake. Reported increased fruit and vegetable intake. Reports medication compliance.;Short term: Daily weights obtained and reported for increase. Utilizing diuretic protocols set by physician.;Long term: Adoption of self-care skills and reduction of barriers for early signs and symptoms recognition and intervention leading to self-care maintenance.    Hypertension Yes    Intervention Provide education on lifestyle modifcations including regular physical activity/exercise, weight management, moderate sodium restriction and increased consumption of fresh fruit, vegetables, and low fat dairy, alcohol moderation, and smoking cessation.;Monitor prescription use compliance.    Expected Outcomes Short Term: Continued assessment and intervention until BP is < 140/109mm HG in hypertensive participants. < 130/46mm HG in hypertensive participants with diabetes,  heart failure or chronic kidney disease.;Long Term: Maintenance of blood pressure at goal levels.    Lipids Yes    Intervention Provide education and support for participant on nutrition & aerobic/resistive exercise along with prescribed medications to achieve LDL '70mg'$ , HDL >$Remo'40mg'lSFHL$ .    Expected Outcomes Short Term: Participant states understanding of desired cholesterol values and is compliant with  medications prescribed. Participant is following exercise prescription and nutrition guidelines.;Long Term: Cholesterol controlled with medications as prescribed, with individualized exercise RX and with personalized nutrition plan. Value goals: LDL < $Rem'70mg'sHPi$ , HDL > 40 mg.    Stress Yes    Intervention Offer individual and/or small group education and counseling on adjustment to heart disease, stress management and health-related lifestyle change. Teach and support self-help strategies.;Refer participants experiencing significant psychosocial distress to appropriate mental health specialists for further evaluation and treatment. When possible, include family members and significant others in education/counseling sessions.    Expected Outcomes Short Term: Participant demonstrates changes in health-related behavior, relaxation and other stress management skills, ability to obtain effective social support, and compliance with psychotropic medications if prescribed.;Long Term: Emotional wellbeing is indicated by absence of clinically significant psychosocial distress or social isolation.    Personal Goal Other Yes    Personal Goal Short term: get back to Ex, control SOB Long term: lose wt, better heart health    Intervention Will continue to monitor pt and progress workloads as tolerated without sign or symptom    Expected Outcomes Pt will achieve her goals             Core Components/Risk Factors/Patient Goals Review:    Core Components/Risk Factors/Patient Goals at Discharge (Final Review):    ITP  Comments:  ITP Comments     Row Name 02/04/22 1053           ITP Comments Dr Fransico Him MD, Medical Director. Introduction to [facility]/ Intensive Cardiac Rehab. Initial orientation packet reviewed with the patient                Comments: Participant attended orientation for the cardiac rehabilitation program on  02/04/2022  to perform initial intake and exercise walk test. Patient introduced to the [facility] education and orientation packet was reviewed. Completed 6-minute walk test, measurements, initial ITP, and exercise prescription. Vital signs stable. Telemetry-normal sinus rhythm, Gwen stopped due to mild shortness of breath and fatigue from 4:55-5:21 this resolved with rest. 6 minute nustep test was preformed as patient forgot her cane.Harrell Gave RN BSN .   Service time was from 0925 to 1150.

## 2022-02-05 MED ORDER — ONETOUCH VERIO W/DEVICE KIT: PACK | 0 refills | Status: AC

## 2022-02-05 MED ORDER — ONETOUCH DELICA PLUS LANCET33G MISC: 3 refills | Status: AC

## 2022-02-05 MED ORDER — ONETOUCH VERIO VI STRP: ORAL_STRIP | 3 refills | Status: AC

## 2022-02-05 NOTE — Addendum Note (Signed)
Addended by: Daisy Blossom, Annie Main L on: 02/05/2022 05:08 PM   Modules accepted: Orders

## 2022-02-05 NOTE — Telephone Encounter (Signed)
Yes ma'am, rx sent.  

## 2022-02-08 ENCOUNTER — Encounter (HOSPITAL_COMMUNITY)
Admission: RE | Admit: 2022-02-08 | Discharge: 2022-02-08 | Disposition: A | Discharge status: Home / Self Care | Payer: No Typology Code available for payment source | Source: Ambulatory Visit | Attending: Internal Medicine | Admitting: Internal Medicine

## 2022-02-08 DIAGNOSIS — I272 Pulmonary hypertension, unspecified: Secondary | ICD-10-CM

## 2022-02-08 DIAGNOSIS — I5032 Chronic diastolic (congestive) heart failure: Secondary | ICD-10-CM | POA: Diagnosis not present

## 2022-02-08 DIAGNOSIS — Z5189 Encounter for other specified aftercare: Secondary | ICD-10-CM | POA: Diagnosis not present

## 2022-02-08 LAB — GLUCOSE, CAPILLARY
Glucose-Capillary: 129 mg/dL — ABNORMAL HIGH (ref 70–99)
Glucose-Capillary: 182 mg/dL — ABNORMAL HIGH (ref 70–99)

## 2022-02-08 NOTE — Progress Notes (Signed)
Daily Session Note  Patient Details  Name: Deborah Coffey MRN: [id number] Date of Birth: 20-Jan-1956 Referring Provider:   Flowsheet Row INTENSIVE CARDIAC REHAB ORIENT from 02/04/2022 in [place]  Referring Provider Dr. Glori Bickers, MD       Encounter Date: 02/08/2022  Check In:  Session Check In - 02/08/22 1052       Check-In   Supervising physician immediately available to respond to emergencies [facility] MD immediately available    Physician(s) Dr. Debara Pickett    Location [facility]    Staff Present Barnet Pall, RN, Deland Pretty, MS, ACSM-CEP, Exercise Physiologist;Bailey Pearline Cables, MS, Exercise Physiologist;David Makemson, MS, ACSM-CEP, CCRP, Exercise Physiologist    Virtual Visit No    Medication changes reported     No    Fall or balance concerns reported    No    Tobacco Cessation No Change    Current number of cigarettes/nicotine per day     1    Warm-up and Cool-down Performed as group-led instruction    Resistance Training Performed Yes    VAD Patient? No    PAD/SET Patient? No      Pain Assessment   Currently in Pain? No/denies    Pain Score 0-No pain    Multiple Pain Sites No             Capillary Blood Glucose: Results for orders placed or performed during the hospital encounter of 02/08/22 (from the past 24 hour(s))  Glucose, capillary     Status: Abnormal   Collection Time: 02/08/22 10:25 AM  Result Value Ref Range   Glucose-Capillary 129 (H) 70 - 99 mg/dL  Glucose, capillary     Status: Abnormal   Collection Time: 02/08/22 11:13 AM  Result Value Ref Range   Glucose-Capillary 182 (H) 70 - 99 mg/dL     Exercise Prescription Changes - 02/08/22 1025       Response to Exercise   Blood Pressure (Admit) 130/80    Blood Pressure (Exercise) 118/82    Blood Pressure (Exit) 104/68    Heart Rate (Admit) 78 bpm    Heart Rate (Exercise) 98 bpm    Heart Rate (Exit) 77 bpm    Rating of Perceived  Exertion (Exercise) 11    Symptoms None    Comments Bilateral inner thigh pain with exercise.    Duration Progress to 30 minutes of  aerobic without signs/symptoms of physical distress    Intensity THRR unchanged      Progression   Progression Continue to progress workloads to maintain intensity without signs/symptoms of physical distress.    Average METs 1.5      Resistance Training   Weight 2 lbs    Reps 10-15    Time 10 Minutes      Interval Training   Interval Training No      NuStep   Level 1    SPM 64    Minutes 20    METs 1.5             Social History   Tobacco Use  Smoking Status Some Days   Packs/day: 0.25   Years: 42.00   Total pack years: 10.50   Types: Cigarettes  Smokeless Tobacco Never  Tobacco Comments   Smokes 1-3 cigars/ black cigarettes a week. Agreed to referral to [facility] quit line. Referral faxed. Patient given cessation information    Goals Met:  Exercise tolerated well No report of concerns or symptoms  today Strength training completed today  Goals Unmet:  Not Applicable  Comments: [name] started cardiac rehab today.  Pt tolerated light exercise without difficulty. VSS, telemetry-Sinus Rhythm, asymptomatic.  Medication list reconciled. Pt denies barriers to medicaiton compliance.  PSYCHOSOCIAL ASSESSMENT:  PHQ-4. Pt exhibits positive coping skills, hopeful outlook with supportive family.Gwen has a history of depression denies being currently depressed. As [place] lives with her daughter and 3 grandchildren No psychosocial needs identified at this time, no psychosocial interventions necessary.    Pt enjoys spending time with her grandchildren.   Pt oriented to exercise equipment and routine.    Understanding verbalized. Harrell Gave RN BSN    Dr. Fransico Him is Medical Director for Cardiac Rehab at [facility] RN BSN

## 2022-02-08 NOTE — Telephone Encounter (Signed)
Noted  

## 2022-02-10 ENCOUNTER — Encounter (HOSPITAL_COMMUNITY)
Admission: RE | Admit: 2022-02-10 | Discharge: 2022-02-10 | Disposition: A | Discharge status: Home / Self Care | Payer: No Typology Code available for payment source | Source: Ambulatory Visit | Attending: Internal Medicine | Admitting: Internal Medicine

## 2022-02-10 DIAGNOSIS — I272 Pulmonary hypertension, unspecified: Secondary | ICD-10-CM

## 2022-02-10 DIAGNOSIS — I5032 Chronic diastolic (congestive) heart failure: Secondary | ICD-10-CM

## 2022-02-10 DIAGNOSIS — Z5189 Encounter for other specified aftercare: Secondary | ICD-10-CM | POA: Diagnosis not present

## 2022-02-10 LAB — GLUCOSE, CAPILLARY
Glucose-Capillary: 235 mg/dL — ABNORMAL HIGH (ref 70–99)
Glucose-Capillary: 225 mg/dL — ABNORMAL HIGH (ref 70–99)

## 2022-02-10 NOTE — Progress Notes (Signed)
QUALITY OF LIFE SCORE REVIEW  Pt completed Quality of Life survey as a participant in Cardiac Rehab.  Scores 21.0 or below are considered low.  Pt score very low in several areas Overall 20.34, Health and Function 18.46, socioeconomic 20.17, physiological and spiritual 22.0, family 22.88. Patient quality of life slightly altered by physical constraints which limits ability to perform as prior to recent cardiac illness.Deborah Coffey is dissatisfied with her health due to her heart failure diagnosis and diabetes.  Offered emotional support and reassurance.  Will continue to monitor and intervene as necessary. Deborah Coffey denies being depressed currently and has the support of her daughter and grandchildren.Barnet Pall, RN,BSN 02/10/2022 11:22 AM

## 2022-02-12 ENCOUNTER — Encounter (HOSPITAL_COMMUNITY): Payer: No Typology Code available for payment source

## 2022-02-15 ENCOUNTER — Encounter (HOSPITAL_COMMUNITY): Payer: No Typology Code available for payment source

## 2022-02-17 ENCOUNTER — Telehealth (HOSPITAL_COMMUNITY): Payer: Self-pay | Admitting: Family Medicine

## 2022-02-17 ENCOUNTER — Encounter (HOSPITAL_COMMUNITY): Payer: No Typology Code available for payment source

## 2022-02-17 ENCOUNTER — Telehealth (HOSPITAL_COMMUNITY): Payer: Self-pay | Admitting: *Deleted

## 2022-02-17 NOTE — Telephone Encounter (Signed)
Spoke with the patient asked to take COVID 19 test before returning to cardiac rehab and to be symptom free for 48 hours prior to returning to exercise. Patient states understanding.Barnet Pall, RN,BSN 02/17/2022 9:35 AM

## 2022-02-19 ENCOUNTER — Encounter (HOSPITAL_COMMUNITY): Payer: No Typology Code available for payment source

## 2022-02-22 ENCOUNTER — Other Ambulatory Visit: Payer: Self-pay | Admitting: Family Medicine

## 2022-02-22 ENCOUNTER — Telehealth: Payer: Self-pay | Admitting: Family Medicine

## 2022-02-22 ENCOUNTER — Other Ambulatory Visit: Payer: Self-pay | Admitting: Pharmacist

## 2022-02-22 ENCOUNTER — Encounter (HOSPITAL_COMMUNITY): Payer: No Typology Code available for payment source

## 2022-02-22 DIAGNOSIS — I152 Hypertension secondary to endocrine disorders: Secondary | ICD-10-CM

## 2022-02-22 DIAGNOSIS — Z794 Long term (current) use of insulin: Secondary | ICD-10-CM

## 2022-02-22 DIAGNOSIS — E1169 Type 2 diabetes mellitus with other specified complication: Secondary | ICD-10-CM

## 2022-02-22 DIAGNOSIS — I5032 Chronic diastolic (congestive) heart failure: Secondary | ICD-10-CM

## 2022-02-22 MED ORDER — METFORMIN HCL ER 500 MG PO TB24: 1000.0000 mg | ORAL_TABLET | Freq: Two times a day (BID) | ORAL | 0 refills | Status: DC

## 2022-02-22 MED ORDER — METOPROLOL TARTRATE 50 MG PO TABS: 50.0000 mg | ORAL_TABLET | Freq: Two times a day (BID) | ORAL | 0 refills | Status: DC

## 2022-02-22 MED ORDER — DAPAGLIFLOZIN PROPANEDIOL 5 MG PO TABS: 5.0000 mg | ORAL_TABLET | Freq: Every day | ORAL | 0 refills | Status: DC

## 2022-02-22 MED ORDER — FUROSEMIDE 20 MG PO TABS: 40.0000 mg | ORAL_TABLET | Freq: Two times a day (BID) | ORAL | 0 refills | Status: DC

## 2022-02-22 MED ORDER — TOUJEO MAX SOLOSTAR 300 UNIT/ML ~~LOC~~ SOPN: 56.0000 [IU] | PEN_INJECTOR | Freq: Every day | SUBCUTANEOUS | 1 refills | Status: DC

## 2022-02-22 NOTE — Telephone Encounter (Signed)
Pt states she has been trying to get all her since 10/04 when she had a dr apt w/ [facility]. Pt states [facility] faxed Dr Margarita Rana an order that needs to be filled out before they can release her meds.  Pt never picked up her meds on 01/20/22 and she has been w/out her medication since.  meds atorvastatin (LIPITOR) 10 MG tablet  dapagliflozin propanediol (FARXIGA) 5 MG TABS tablet  furosemide (LASIX) 20 MG tablet  metFORMIN (GLUCOPHAGE-XR) 500 MG 24 hr tablet  metoprolol tartrate (LOPRESSOR) 50 MG tablet   Pt's lisinopril (ZESTRIL) 20 MG tablet needs to be changed to a 90 day with 1 refill. (Instead of 30 w/ [facility] needs 90 day scripts. Phone [phone] Fax [phone]

## 2022-02-22 NOTE — Telephone Encounter (Signed)
Requested medication (s) are due for refill today: Yes  Requested medication (s) are on the active medication list: Yes  Last refill:  01/20/22  Future visit scheduled: Yes  Notes to clinic:  Unable to refill per protocol due to failed labs, no updated results. Resend to mail order pharmacy, [facility] not [facility].     Requested Prescriptions  Pending Prescriptions Disp Refills   atorvastatin (LIPITOR) 10 MG tablet 90 tablet 0    Sig: Take 1 tablet (10 mg total) by mouth once daily.     Cardiovascular:  Antilipid - Statins Failed - 02/22/2022  4:07 PM      Failed - Lipid Panel in normal range within the last 12 months    Cholesterol, Total  Date Value Ref Range Status  01/21/2021 161 100 - 199 mg/dL Final   LDL Chol Calc (NIH)  Date Value Ref Range Status  01/21/2021 70 0 - 99 mg/dL Final   HDL  Date Value Ref Range Status  01/21/2021 72 >39 mg/dL Final   Triglycerides  Date Value Ref Range Status  01/21/2021 104 0 - 149 mg/dL Final         Passed - Patient is not pregnant      Passed - Valid encounter within last 12 months    Recent Outpatient Visits           1 month ago Type 2 diabetes mellitus with other circulatory complication, with long-term current use of insulin ([place])   [place], [place], [place]   5 months ago Type 2 diabetes mellitus with other circulatory complication, with long-term current use of insulin ([place])   [place], [place], MD   8 months ago Encounter for [employer] annual wellness exam   [place], [place], MD   10 months ago Type 2 diabetes mellitus with other circulatory complication, with long-term current use of insulin ([place])   [place], Charlane Ferretti, MD   1 year ago Type 2 diabetes mellitus with other circulatory complication, with long-term current use of insulin ([place])    [place], [place], [place]       Future Appointments             In 2 months Charlott Rakes, MD [place]            Signed Prescriptions Disp Refills   dapagliflozin propanediol (FARXIGA) 5 MG TABS tablet 90 tablet 0    Sig: Take 1 tablet (5 mg total) by mouth daily before breakfast.     Endocrinology:  Diabetes - SGLT2 Inhibitors Failed - 02/22/2022  4:07 PM      Failed - Cr in normal range and within 360 days    Creat  Date Value Ref Range Status  02/27/2015 1.02 0.50 - 1.05 mg/dL Final   Creatinine, Ser  Date Value Ref Range Status  01/07/2022 1.34 (H) 0.44 - 1.00 mg/dL Final   Creatinine, Urine  Date Value Ref Range Status  06/26/2015 115 20 - 320 mg/dL Final         Failed - eGFR in normal range and within 360 days    GFR, Est African American  Date Value Ref Range Status  02/27/2015 71 >=60 mL/min Final   GFR calc Af Amer  Date Value Ref Range Status  10/04/2019  46 (L) >59 mL/min/1.73 Final    Comment:    **[facility] currently reports eGFR in compliance with the current**   recommendations of the [employer]. [facility] will   update reporting as new guidelines are published from the NKF-ASN   Task force.    GFR, Est Non African American  Date Value Ref Range Status  02/27/2015 61 >=60 mL/min Final    Comment:      The estimated GFR is a calculation valid for adults (>=53 years old) that uses the CKD-EPI algorithm to adjust for age and sex. It is   not to be used for children, pregnant women, hospitalized patients,    patients on dialysis, or with rapidly changing kidney function. According to the NKDEP, eGFR >89 is normal, 60-89 shows mild impairment, 30-59 shows moderate impairment, 15-29 shows severe impairment and <15 is ESRD.      GFR, Estimated  Date Value Ref Range Status  01/07/2022 44 (L) >60 mL/min Final    Comment:    (NOTE) Calculated using the  CKD-EPI Creatinine Equation (2021)    eGFR  Date Value Ref Range Status  08/31/2021 50 (L) >59 mL/min/1.73 Final         Passed - HBA1C is between 0 and 7.9 and within 180 days    HbA1c, POC (controlled diabetic range)  Date Value Ref Range Status  01/20/2022 7.1 (A) 0.0 - 7.0 % Final         Passed - Valid encounter within last 6 months    Recent Outpatient Visits           1 month ago Type 2 diabetes mellitus with other circulatory complication, with long-term current use of insulin [facility])   [place], [place], [place]   5 months ago Type 2 diabetes mellitus with other circulatory complication, with long-term current use of insulin ([place])   [place], Charlane Ferretti, MD   8 months ago Encounter for [employer] annual wellness exam   [place], [place], MD   10 months ago Type 2 diabetes mellitus with other circulatory complication, with long-term current use of insulin ([place])   [place], [place], MD   1 year ago Type 2 diabetes mellitus with other circulatory complication, with long-term current use of insulin [facility])   [place], [place], [place]       Future Appointments             In 2 months Charlott Rakes, MD [place]             furosemide (LASIX) 20 MG tablet 360 tablet 0    Sig: Take 2 tablets (40 mg total) by mouth 2 (two) times daily.     Cardiovascular:  Diuretics - Loop Failed - 02/22/2022  4:07 PM      Failed - Cr in normal range and within 180 days    Creat  Date Value Ref Range Status  02/27/2015 1.02 0.50 - 1.05 mg/dL Final   Creatinine, Ser  Date Value Ref Range Status  01/07/2022 1.34 (H) 0.44 - 1.00 mg/dL Final   Creatinine, Urine  Date Value Ref Range Status  06/26/2015 115 20 - 320 mg/dL Final         Failed - Mg Level in  normal range and within 180 days    No  results found for: "MG"       Passed - K in normal range and within 180 days    Potassium  Date Value Ref Range Status  01/07/2022 4.3 3.5 - 5.1 mmol/L Final         Passed - Ca in normal range and within 180 days    Calcium  Date Value Ref Range Status  01/07/2022 9.7 8.9 - 10.3 mg/dL Final   Calcium, Ion  Date Value Ref Range Status  04/16/2021 1.26 1.15 - 1.40 mmol/L Final         Passed - Na in normal range and within 180 days    Sodium  Date Value Ref Range Status  01/07/2022 141 135 - 145 mmol/L Final  08/31/2021 142 134 - 144 mmol/L Final         Passed - Cl in normal range and within 180 days    Chloride  Date Value Ref Range Status  01/07/2022 108 98 - 111 mmol/L Final         Passed - Last BP in normal range    BP Readings from Last 1 Encounters:  02/04/22 122/68         Passed - Valid encounter within last 6 months    Recent Outpatient Visits           1 month ago Type 2 diabetes mellitus with other circulatory complication, with long-term current use of insulin [facility])   [place], [place], [place]   5 months ago Type 2 diabetes mellitus with other circulatory complication, with long-term current use of insulin ([place])   [place], Charlane Ferretti, MD   8 months ago Encounter for [employer] annual wellness exam   [place], [place], MD   10 months ago Type 2 diabetes mellitus with other circulatory complication, with long-term current use of insulin ([place])   [place], [place], MD   1 year ago Type 2 diabetes mellitus with other circulatory complication, with long-term current use of insulin [facility])   [place], [place], [place]       Future Appointments             In 2 months Charlott Rakes, MD [place]             metoprolol tartrate (LOPRESSOR) 50 MG tablet 180 tablet 0    Sig: Take 1 tablet (50 mg total) by mouth 2 (two) times daily.     Cardiovascular:  Beta Blockers Passed - 02/22/2022  4:07 PM      Passed - Last BP in normal range    BP Readings from Last 1 Encounters:  02/04/22 122/68         Passed - Last Heart Rate in normal range    Pulse Readings from Last 1 Encounters:  02/04/22 91         Passed - Valid encounter within last 6 months    Recent Outpatient Visits           1 month ago Type 2 diabetes mellitus with other circulatory complication, with long-term current use of insulin [facility])   [place], [place], [place]   5 months ago Type 2 diabetes mellitus with other circulatory complication, with long-term current use of insulin ([place])   [place]  [place], [place], MD   8 months ago Encounter for [employer] annual wellness exam   [place], [place], MD   10 months ago Type 2 diabetes mellitus with other circulatory complication, with long-term current use of insulin ([place])   [place], Charlane Ferretti, MD   1 year ago Type 2 diabetes mellitus with other circulatory complication, with long-term current use of insulin [facility])   [place], [place], [place]       Future Appointments             In 2 months Charlott Rakes, MD [place]             metFORMIN (GLUCOPHAGE-XR) 500 MG 24 hr tablet 360 tablet 0    Sig: Take 2 tablets (1,000 mg total) by mouth 2 (two) times daily.     Endocrinology:  Diabetes - Biguanides Failed - 02/22/2022  4:07 PM      Failed - Cr in normal range and within 360 days    Creat  Date Value Ref Range Status  02/27/2015 1.02 0.50 - 1.05 mg/dL Final   Creatinine, Ser  Date Value Ref Range Status  01/07/2022 1.34 (H)  0.44 - 1.00 mg/dL Final   Creatinine, Urine  Date Value Ref Range Status  06/26/2015 115 20 - 320 mg/dL Final         Failed - eGFR in normal range and within 360 days    GFR, Est African American  Date Value Ref Range Status  02/27/2015 71 >=60 mL/min Final   GFR calc Af Amer  Date Value Ref Range Status  10/04/2019 46 (L) >59 mL/min/1.73 Final    Comment:    **[facility] currently reports eGFR in compliance with the current**   recommendations of the [employer]. [facility] will   update reporting as new guidelines are published from the NKF-ASN   Task force.    GFR, Est Non African American  Date Value Ref Range Status  02/27/2015 61 >=60 mL/min Final    Comment:      The estimated GFR is a calculation valid for adults (>=58 years old) that uses the CKD-EPI algorithm to adjust for age and sex. It is   not to be used for children, pregnant women, hospitalized patients,    patients on dialysis, or with rapidly changing kidney function. According to the NKDEP, eGFR >89 is normal, 60-89 shows mild impairment, 30-59 shows moderate impairment, 15-29 shows severe impairment and <15 is ESRD.      GFR, Estimated  Date Value Ref Range Status  01/07/2022 44 (L) >60 mL/min Final    Comment:    (NOTE) Calculated using the CKD-EPI Creatinine Equation (2021)    eGFR  Date Value Ref Range Status  08/31/2021 50 (L) >59 mL/min/1.73 Final         Failed - B12 Level in normal range and within 720 days    No results found for: "VITAMINB12"       Passed - HBA1C is between 0 and 7.9 and within 180 days    HbA1c, POC (controlled diabetic range)  Date Value Ref Range Status  01/20/2022 7.1 (A) 0.0 - 7.0 % Final         Passed - Valid encounter within last 6 months    Recent Outpatient Visits           1  month ago Type 2 diabetes mellitus with other circulatory complication, with long-term current use of insulin [facility])   [place], [place], [place]   5 months ago Type 2 diabetes mellitus with other circulatory complication, with long-term current use of insulin ([place])   [place], Charlane Ferretti, MD   8 months ago Encounter for [employer] annual wellness exam   [place], Charlane Ferretti, MD   10 months ago Type 2 diabetes mellitus with other circulatory complication, with long-term current use of insulin ([place])   [place], Charlane Ferretti, MD   1 year ago Type 2 diabetes mellitus with other circulatory complication, with long-term current use of insulin ([place])   [place], [place], [place]       Future Appointments             In 2 months Charlott Rakes, MD [place] - CBC within normal limits and completed in the last 12 months    WBC  Date Value Ref Range Status  12/18/2021 8.6 4.0 - 10.5 K/uL Final   RBC  Date Value Ref Range Status  12/18/2021 3.98 3.87 - 5.11 MIL/uL Final   Hemoglobin  Date Value Ref Range Status  12/18/2021 10.9 (L) 12.0 - 15.0 g/dL Final  04/10/2021 12.1 11.1 - 15.9 g/dL Final   HCT  Date Value Ref Range Status  12/18/2021 35.5 (L) 36.0 - 46.0 % Final   Hematocrit  Date Value Ref Range Status  04/10/2021 37.0 34.0 - 46.6 % Final   MCHC  Date Value Ref Range Status  12/18/2021 30.7 30.0 - 36.0 g/dL Final   [facility]  Date Value Ref Range Status  12/18/2021 27.4 26.0 - 34.0 pg Final   MCV  Date Value Ref Range Status  12/18/2021 89.2 80.0 - 100.0 fL Final  04/10/2021 83 79 - 97 fL Final   No results found for: "PLTCOUNTKUC", "LABPLAT", "POCPLA" RDW  Date Value Ref Range Status  12/18/2021 16.9 (H) 11.5 - 15.5 % Final  04/10/2021 13.7 11.7 - 15.4 % Final

## 2022-02-22 NOTE — Telephone Encounter (Signed)
Rx resent for 56u daily.

## 2022-02-22 NOTE — Telephone Encounter (Signed)
Pharmacy called about the direction for insulin glargine, 2 Unit Dial, (TOUJEO MAX SOLOSTAR) 300 UNIT/ML Solostar Pen   She stated that this Can only be dispensed in even numbers but directions have to inject 55 units / call back number is [phone]/ ref# [id number]/ please advise

## 2022-02-22 NOTE — Telephone Encounter (Signed)
Resending to mail order pharmacy #90/0 refills.   Requested Prescriptions  Pending Prescriptions Disp Refills   atorvastatin (LIPITOR) 10 MG tablet 90 tablet 0    Sig: Take 1 tablet (10 mg total) by mouth once daily.     Cardiovascular:  Antilipid - Statins Failed - 02/22/2022  4:07 PM      Failed - Lipid Panel in normal range within the last 12 months    Cholesterol, Total  Date Value Ref Range Status  01/21/2021 161 100 - 199 mg/dL Final   LDL Chol Calc (NIH)  Date Value Ref Range Status  01/21/2021 70 0 - 99 mg/dL Final   HDL  Date Value Ref Range Status  01/21/2021 72 >39 mg/dL Final   Triglycerides  Date Value Ref Range Status  01/21/2021 104 0 - 149 mg/dL Final         Passed - Patient is not pregnant      Passed - Valid encounter within last 12 months    Recent Outpatient Visits           1 month ago Type 2 diabetes mellitus with other circulatory complication, with long-term current use of insulin ([place])   [place], [place], [place]   5 months ago Type 2 diabetes mellitus with other circulatory complication, with long-term current use of insulin ([place])   [place], [place], MD   8 months ago Encounter for [employer] annual wellness exam   [place], [place], MD   10 months ago Type 2 diabetes mellitus with other circulatory complication, with long-term current use of insulin ([place])   [place], Charlane Ferretti, MD   1 year ago Type 2 diabetes mellitus with other circulatory complication, with long-term current use of insulin ([place])   [place], [place], [place]       Future Appointments             In 2 months Charlott Rakes, MD [place]             dapagliflozin propanediol (FARXIGA) 5 MG TABS tablet 90 tablet 0    Sig: Take 1 tablet (5 mg  total) by mouth daily before breakfast.     Endocrinology:  Diabetes - SGLT2 Inhibitors Failed - 02/22/2022  4:07 PM      Failed - Cr in normal range and within 360 days    Creat  Date Value Ref Range Status  02/27/2015 1.02 0.50 - 1.05 mg/dL Final   Creatinine, Ser  Date Value Ref Range Status  01/07/2022 1.34 (H) 0.44 - 1.00 mg/dL Final   Creatinine, Urine  Date Value Ref Range Status  06/26/2015 115 20 - 320 mg/dL Final         Failed - eGFR in normal range and within 360 days    GFR, Est African American  Date Value Ref Range Status  02/27/2015 71 >=60 mL/min Final   GFR calc Af Amer  Date Value Ref Range Status  10/04/2019 46 (L) >59 mL/min/1.73 Final    Comment:    **[facility] currently reports eGFR in compliance with the current**   recommendations of the [employer]. [facility] will   update reporting as new guidelines are published from the NKF-ASN   Task force.    GFR, Est Non African American  Date Value Ref Range Status  02/27/2015 61 >=60 mL/min Final    Comment:      The estimated GFR is a calculation valid for adults (>=77 years old) that uses the CKD-EPI algorithm to adjust for age and sex. It is   not to be used for children, pregnant women, hospitalized patients,    patients on dialysis, or with rapidly changing kidney function. According to the NKDEP, eGFR >89 is normal, 60-89 shows mild impairment, 30-59 shows moderate impairment, 15-29 shows severe impairment and <15 is ESRD.      GFR, Estimated  Date Value Ref Range Status  01/07/2022 44 (L) >60 mL/min Final    Comment:    (NOTE) Calculated using the CKD-EPI Creatinine Equation (2021)    eGFR  Date Value Ref Range Status  08/31/2021 50 (L) >59 mL/min/1.73 Final         Passed - HBA1C is between 0 and 7.9 and within 180 days    HbA1c, POC (controlled diabetic range)  Date Value Ref Range Status  01/20/2022 7.1 (A) 0.0 - 7.0 % Final         Passed - Valid encounter within  last 6 months    Recent Outpatient Visits           1 month ago Type 2 diabetes mellitus with other circulatory complication, with long-term current use of insulin [facility])   [place], [place], [place]   5 months ago Type 2 diabetes mellitus with other circulatory complication, with long-term current use of insulin ([place])   [place], Charlane Ferretti, MD   8 months ago Encounter for [employer] annual wellness exam   [place], [place], MD   10 months ago Type 2 diabetes mellitus with other circulatory complication, with long-term current use of insulin ([place])   [place], [place], MD   1 year ago Type 2 diabetes mellitus with other circulatory complication, with long-term current use of insulin [facility])   [place], [place], [place]       Future Appointments             In 2 months Charlott Rakes, MD [place]             furosemide (LASIX) 20 MG tablet 360 tablet 0    Sig: Take 2 tablets (40 mg total) by mouth 2 (two) times daily.     Cardiovascular:  Diuretics - Loop Failed - 02/22/2022  4:07 PM      Failed - Cr in normal range and within 180 days    Creat  Date Value Ref Range Status  02/27/2015 1.02 0.50 - 1.05 mg/dL Final   Creatinine, Ser  Date Value Ref Range Status  01/07/2022 1.34 (H) 0.44 - 1.00 mg/dL Final   Creatinine, Urine  Date Value Ref Range Status  06/26/2015 115 20 - 320 mg/dL Final         Failed - Mg Level in normal range and within 180 days    No results found for: "MG"       Passed - K in normal range and within 180 days    Potassium  Date Value Ref Range Status  01/07/2022 4.3 3.5 - 5.1 mmol/L Final         Passed - Ca in normal range and within 180 days    Calcium  Date Value  Ref Range Status  01/07/2022 9.7 8.9 - 10.3 mg/dL Final    Calcium, Ion  Date Value Ref Range Status  04/16/2021 1.26 1.15 - 1.40 mmol/L Final         Passed - Na in normal range and within 180 days    Sodium  Date Value Ref Range Status  01/07/2022 141 135 - 145 mmol/L Final  08/31/2021 142 134 - 144 mmol/L Final         Passed - Cl in normal range and within 180 days    Chloride  Date Value Ref Range Status  01/07/2022 108 98 - 111 mmol/L Final         Passed - Last BP in normal range    BP Readings from Last 1 Encounters:  02/04/22 122/68         Passed - Valid encounter within last 6 months    Recent Outpatient Visits           1 month ago Type 2 diabetes mellitus with other circulatory complication, with long-term current use of insulin [facility])   [place], [place], [place]   5 months ago Type 2 diabetes mellitus with other circulatory complication, with long-term current use of insulin ([place])   [employer] [place], Charlane Ferretti, MD   8 months ago Encounter for [employer] annual wellness exam   [place], [place], MD   10 months ago Type 2 diabetes mellitus with other circulatory complication, with long-term current use of insulin ([place])   [place], [place], MD   1 year ago Type 2 diabetes mellitus with other circulatory complication, with long-term current use of insulin [facility])   [place], [place], [place]       Future Appointments             In 2 months Charlott Rakes, MD [place]             metoprolol tartrate (LOPRESSOR) 50 MG tablet 180 tablet 0    Sig: Take 1 tablet (50 mg total) by mouth 2 (two) times daily.     Cardiovascular:  Beta Blockers Passed - 02/22/2022  4:07 PM      Passed - Last BP in normal range    BP Readings from Last 1 Encounters:  02/04/22 122/68         Passed - Last Heart Rate  in normal range    Pulse Readings from Last 1 Encounters:  02/04/22 91         Passed - Valid encounter within last 6 months    Recent Outpatient Visits           1 month ago Type 2 diabetes mellitus with other circulatory complication, with long-term current use of insulin [facility])   [place], [place], [place]   5 months ago Type 2 diabetes mellitus with other circulatory complication, with long-term current use of insulin ([place])   [place], Charlane Ferretti, MD   8 months ago Encounter for [employer] annual wellness exam   [place], [place], MD   10 months ago Type 2 diabetes mellitus with other circulatory complication, with long-term current use of insulin ([place])   [facility] Charlott Rakes, MD   1  year ago Type 2 diabetes mellitus with other circulatory complication, with long-term current use of insulin [facility])   [place], [place], [place]       Future Appointments             In 2 months Charlott Rakes, MD [place]             metFORMIN (GLUCOPHAGE-XR) 500 MG 24 hr tablet 360 tablet 0    Sig: Take 2 tablets (1,000 mg total) by mouth 2 (two) times daily.     Endocrinology:  Diabetes - Biguanides Failed - 02/22/2022  4:07 PM      Failed - Cr in normal range and within 360 days    Creat  Date Value Ref Range Status  02/27/2015 1.02 0.50 - 1.05 mg/dL Final   Creatinine, Ser  Date Value Ref Range Status  01/07/2022 1.34 (H) 0.44 - 1.00 mg/dL Final   Creatinine, Urine  Date Value Ref Range Status  06/26/2015 115 20 - 320 mg/dL Final         Failed - eGFR in normal range and within 360 days    GFR, Est African American  Date Value Ref Range Status  02/27/2015 71 >=60 mL/min Final   GFR calc Af Amer  Date Value Ref Range Status  10/04/2019 46 (L) >59  mL/min/1.73 Final    Comment:    **[facility] currently reports eGFR in compliance with the current**   recommendations of the [employer]. [facility] will   update reporting as new guidelines are published from the NKF-ASN   Task force.    GFR, Est Non African American  Date Value Ref Range Status  02/27/2015 61 >=60 mL/min Final    Comment:      The estimated GFR is a calculation valid for adults (>=74 years old) that uses the CKD-EPI algorithm to adjust for age and sex. It is   not to be used for children, pregnant women, hospitalized patients,    patients on dialysis, or with rapidly changing kidney function. According to the NKDEP, eGFR >89 is normal, 60-89 shows mild impairment, 30-59 shows moderate impairment, 15-29 shows severe impairment and <15 is ESRD.      GFR, Estimated  Date Value Ref Range Status  01/07/2022 44 (L) >60 mL/min Final    Comment:    (NOTE) Calculated using the CKD-EPI Creatinine Equation (2021)    eGFR  Date Value Ref Range Status  08/31/2021 50 (L) >59 mL/min/1.73 Final         Failed - B12 Level in normal range and within 720 days    No results found for: "VITAMINB12"       Passed - HBA1C is between 0 and 7.9 and within 180 days    HbA1c, POC (controlled diabetic range)  Date Value Ref Range Status  01/20/2022 7.1 (A) 0.0 - 7.0 % Final         Passed - Valid encounter within last 6 months    Recent Outpatient Visits           1 month ago Type 2 diabetes mellitus with other circulatory complication, with long-term current use of insulin [facility])   [place], [place], [place]   5 months ago Type 2 diabetes mellitus with other circulatory complication, with long-term current use of insulin ([place])   [place] [facility] Charlott Rakes, MD  8 months ago Encounter for [employer] annual wellness exam   [place], [place], MD   10 months  ago Type 2 diabetes mellitus with other circulatory complication, with long-term current use of insulin ([place])   [place], Charlane Ferretti, MD   1 year ago Type 2 diabetes mellitus with other circulatory complication, with long-term current use of insulin ([place])   [place], [employer] [place], [place]       Future Appointments             In 2 months Charlott Rakes, MD [place] - CBC within normal limits and completed in the last 12 months    WBC  Date Value Ref Range Status  12/18/2021 8.6 4.0 - 10.5 K/uL Final   RBC  Date Value Ref Range Status  12/18/2021 3.98 3.87 - 5.11 MIL/uL Final   Hemoglobin  Date Value Ref Range Status  12/18/2021 10.9 (L) 12.0 - 15.0 g/dL Final  04/10/2021 12.1 11.1 - 15.9 g/dL Final   HCT  Date Value Ref Range Status  12/18/2021 35.5 (L) 36.0 - 46.0 % Final   Hematocrit  Date Value Ref Range Status  04/10/2021 37.0 34.0 - 46.6 % Final   MCHC  Date Value Ref Range Status  12/18/2021 30.7 30.0 - 36.0 g/dL Final   [facility]  Date Value Ref Range Status  12/18/2021 27.4 26.0 - 34.0 pg Final   MCV  Date Value Ref Range Status  12/18/2021 89.2 80.0 - 100.0 fL Final  04/10/2021 83 79 - 97 fL Final   No results found for: "PLTCOUNTKUC", "LABPLAT", "POCPLA" RDW  Date Value Ref Range Status  12/18/2021 16.9 (H) 11.5 - 15.5 % Final  04/10/2021 13.7 11.7 - 15.4 % Final

## 2022-02-23 ENCOUNTER — Telehealth: Payer: Self-pay | Admitting: Family Medicine

## 2022-02-23 DIAGNOSIS — Z794 Long term (current) use of insulin: Secondary | ICD-10-CM

## 2022-02-23 MED ORDER — ATORVASTATIN CALCIUM 10 MG PO TABS: 10.0000 mg | ORAL_TABLET | Freq: Every day | ORAL | 0 refills | Status: DC

## 2022-02-23 MED ORDER — TOUJEO MAX SOLOSTAR 300 UNIT/ML ~~LOC~~ SOPN: 56.0000 [IU] | PEN_INJECTOR | Freq: Every day | SUBCUTANEOUS | 2 refills | Status: DC

## 2022-02-23 NOTE — Telephone Encounter (Signed)
Rx sent for #[id number] (2 pens).

## 2022-02-23 NOTE — Telephone Encounter (Signed)
Deborah Coffey w/ [facility] needs clarification on the  insulin glargine, 2 Unit Dial, (TOUJEO MAX SOLOSTAR) 300 UNIT/ML Solostar Pen  [phone]  option 2 Ref [id number]   Deborah Coffey says the [facility] Max is dispensed only in even numbers and this Rx doesn't add up at 15 ml.

## 2022-02-23 NOTE — Progress Notes (Signed)
Cardiac Individual Treatment Plan  Patient Details  Name: Deborah Coffey MRN: [id number] Date of Birth: [birth year] Referring Provider:   Flowsheet Row INTENSIVE CARDIAC REHAB ORIENT from 02/04/2022 in [facility]  Referring Provider Dr. Glori Bickers, MD       Initial Encounter Date:  [facility] from 02/04/2022 in [facility]  Date 02/04/22       Visit Diagnosis: Heart failure, diastolic, chronic ([place])  Pulmonary hypertension ([place])  Patient's Home Medications on Admission:  Current Outpatient Medications:    acetaminophen (TYLENOL) 500 MG tablet, Take 1,000 mg by mouth every 6 (six) hours as needed for headache (pain). , Disp: , Rfl:    Alcohol Swabs (B-D SINGLE USE SWABS REGULAR) PADS, USE AS DIRECTED, Disp: 100 each, Rfl: 0   aspirin EC 81 MG tablet, Take 81 mg by mouth daily., Disp: , Rfl:    atorvastatin (LIPITOR) 10 MG tablet, Take 1 tablet (10 mg total) by mouth once daily., Disp: 90 tablet, Rfl: 0   Blood Glucose Monitoring Suppl (ONETOUCH VERIO) w/Device KIT, Use as instructed to test blood glucose 3 times daily., Disp: 1 kit, Rfl: 0   clobetasol cream (TEMOVATE) 0.05 %, Apply topically 2 (two) times daily. (Patient taking differently: Apply 1 Application topically daily as needed (Rash).), Disp: 60 g, Rfl: 0   Continuous Blood Gluc Sensor (DEXCOM G6 SENSOR) MISC, Use to check blood sugar three times daily. Dx. E11.59, Disp: 3 each, Rfl: 3   dapagliflozin propanediol (FARXIGA) 5 MG TABS tablet, Take 1 tablet (5 mg total) by mouth daily before breakfast., Disp: 90 tablet, Rfl: 0   diclofenac Sodium (VOLTAREN) 1 % GEL, Apply 2 g topically daily as needed (muscle pain)., Disp: , Rfl:    furosemide (LASIX) 20 MG tablet, Take 2 tablets (40 mg total) by mouth 2 (two) times daily., Disp: 360 tablet, Rfl: 0   gabapentin (NEURONTIN) 300 MG capsule, Take orally 2 caps (600 mg) in  the morning and 3 capsules (900 mg) in the evening, Disp: 450 capsule, Rfl: 1   glucose blood (ONETOUCH VERIO) test strip, Use as instructed to test blood glucose 3 times daily., Disp: 100 each, Rfl: 3   insulin glargine, 2 Unit Dial, (TOUJEO MAX SOLOSTAR) 300 UNIT/ML Solostar Pen, Inject 56 Units into the skin daily., Disp: 6 mL, Rfl: 2   Insulin Pen Needle (DROPLET PEN NEEDLES) 31G X 8 MM MISC, USE WITH TOUJEO SOLOSTAR PEN EVERY DAY, Disp: 100 each, Rfl: 0   Lancets (ONETOUCH DELICA PLUS [id number]) MISC, Use as instructed to test blood glucose 3 times daily., Disp: 100 each, Rfl: 3   lisinopril (ZESTRIL) 20 MG tablet, Take 0.5 tablets (10 mg total) by mouth daily., Disp: 30 tablet, Rfl: 6   metFORMIN (GLUCOPHAGE-XR) 500 MG 24 hr tablet, Take 2 tablets (1,000 mg total) by mouth 2 (two) times daily., Disp: 360 tablet, Rfl: 0   metoprolol tartrate (LOPRESSOR) 50 MG tablet, Take 1 tablet (50 mg total) by mouth 2 (two) times daily., Disp: 180 tablet, Rfl: 0   Multiple Vitamin (MULTIVITAMIN WITH MINERALS) TABS tablet, Take 1 tablet by mouth daily., Disp: , Rfl:    nitroGLYCERIN (NITROSTAT) 0.4 MG SL tablet, Place 1 tablet (0.4 mg total) under the tongue every 5 (five) minutes as needed for chest pain., Disp: 25 tablet, Rfl: 3   pantoprazole (PROTONIX) 40 MG tablet, Take 1 tablet (40 mg total) by mouth 2 (two) times daily., Disp:  180 tablet, Rfl: 0  Past Medical History: Past Medical History:  Diagnosis Date   Allergy    Anemia    CAD in native artery 02/18/2004   Mid LAD lesoin just after Large D1 --> CABG x 1 LIMA-LAD   CAP (community acquired pneumonia) 08/07/2014   Chronic kidney disease, stage 3b ([place])    Depression    High cholesterol    History of blood transfusion 04/20/2003   "related to [facility]"    Hypertension    Lung nodules    Mild carotid artery disease ([place])    Pulmonary hypertension ([place])    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  competitive flow   TIA (transient ischemic attack)    Type II diabetes mellitus ([facility])     Tobacco Use: Social History   Tobacco Use  Smoking Status Some Days   Packs/day: 0.25   Years: 42.00   Total pack years: 10.50   Types: Cigarettes  Smokeless Tobacco Never  Tobacco Comments   Smokes 1-3 cigars/ black cigarettes a week. Agreed to referral to [facility] quit line. Referral faxed. Patient given cessation information    Labs: Review Flowsheet  More data exists      Latest Ref Rng & Units 01/21/2021 04/16/2021 04/27/2021 08/31/2021 01/20/2022  Labs for ITP Cardiac and Pulmonary Rehab  Cholestrol 100 - 199 mg/dL 161  - - - -  LDL (calc) 0 - 99 mg/dL 70  - - - -  HDL-C >39 mg/dL 72  - - - -  Trlycerides 0 - 149 mg/dL 104  - - - -  Hemoglobin A1c 0.0 - 7.0 % 9.4  - 7.8  8.2  7.1   PH, Arterial 7.350 - 7.450 - 7.355  - - -  PCO2 arterial 32.0 - 48.0 mmHg - 39.7  - - -  Bicarbonate 20.0 - 28.0 mmol/L - 22.2  23.6  25.4  - - -  TCO2 22 - 32 mmol/L - _0 - - -  Acid-base deficit 0.0 - 2.0 mmol/L - 3.0  2.0  - - -  O2 Saturation % - 97.0  70.0  72.0  - - -    Capillary Blood Glucose: Lab Results  Component Value Date   GLUCAP 225 (H) 02/10/2022   GLUCAP 235 (H) 02/10/2022   GLUCAP 182 (H) 02/08/2022   GLUCAP 129 (H) 02/08/2022   GLUCAP 180 (H) 02/04/2022     Exercise Target Goals: Exercise Program Goal: Individual exercise prescription set using results from initial 6 min walk test and THRR while considering  patient's activity barriers and safety.   Exercise Prescription Goal: Initial exercise prescription builds to 30-45 minutes a day of aerobic activity, 2-3 days per week.  Home exercise guidelines will be given to patient during program as part of exercise prescription that the participant will acknowledge.  Activity Barriers & Risk Stratification:  Activity Barriers & Cardiac Risk Stratification - 02/04/22 1123       Activity Barriers & Cardiac Risk Stratification    Activity Barriers Decreased Ventricular Function;Back Problems;Balance Concerns;Deconditioning;History of Falls;Assistive Device;Shortness of Breath    Cardiac Risk Stratification High             6 Minute Walk:  6 Minute Walk     Row Name 02/04/22 1113         6 Minute Walk   Phase Initial  Pt uses cane and did  not bring it, Nustep test due to balance issues and previous falls     Distance 1017 feet  Nustep test     Walk Time 6 minutes     # of Rest Breaks 1  4.55-5.21 rest break due to fatigue and SOB     MPH 1.93     METS 1.79     RPE 11     Perceived Dyspnea  1     VO2 Peak 6.27     Symptoms Yes (comment)     Comments Rest break 4.55-5.21 due to fatigue and SOB (Dys 1) Nustep test info: AVG SPM: 65, AVG WATTS: 12, Total steps: 356, Dist Km: 0.31, AVG MET's: 1.4     Resting HR 91 bpm     Resting BP 122/68     Resting Oxygen Saturation  98 %     Exercise Oxygen Saturation  during 6 min walk 97 %     Max Ex. HR 95 bpm     Max Ex. BP 107/70     2 Minute Post BP 118/74              Oxygen Initial Assessment:   Oxygen Re-Evaluation:   Oxygen Discharge (Final Oxygen Re-Evaluation):   Initial Exercise Prescription:  Initial Exercise Prescription - 02/04/22 1100       Date of Initial Exercise RX and Referring Provider   Date 02/04/22    Referring Provider Dr. Glori Bickers, MD    Expected Discharge Date 04/02/22      NuStep   Level 1    SPM 60    Minutes 20    METs 1.6      Prescription Details   Frequency (times per week) 3    Duration Progress to 30 minutes of continuous aerobic without signs/symptoms of physical distress      Intensity   THRR 40-80% of Max Heartrate 62-125    Ratings of Perceived Exertion 11-13    Perceived Dyspnea 0-4      Progression   Progression Continue progressive overload as per policy without signs/symptoms or physical distress.      Resistance Training   Training Prescription Yes    Weight 2 lbs    Reps  10-15             Perform Capillary Blood Glucose checks as needed.  Exercise Prescription Changes:   Exercise Prescription Changes     Row Name 02/08/22 1025             Response to Exercise   Blood Pressure (Admit) 130/80       Blood Pressure (Exercise) 118/82       Blood Pressure (Exit) 104/68       Heart Rate (Admit) 78 bpm       Heart Rate (Exercise) 98 bpm       Heart Rate (Exit) 77 bpm       Rating of Perceived Exertion (Exercise) 11       Symptoms None       Comments Bilateral inner thigh pain with exercise.       Duration Progress to 30 minutes of  aerobic without signs/symptoms of physical distress       Intensity THRR unchanged         Progression   Progression Continue to progress workloads to maintain intensity without signs/symptoms of physical distress.       Average METs 1.5         Resistance Training  Weight 2 lbs       Reps 10-15       Time 10 Minutes         Interval Training   Interval Training No         NuStep   Level 1       SPM 64       Minutes 20       METs 1.5                Exercise Comments:   Exercise Comments     Row Name 02/08/22 1131           Exercise Comments Patient tolerated low intensity fair, c/o bilateral inner thigh pain, rest breaks taken. Oriented to warm-up and cool-down, some stretches modified for the chair.                Exercise Goals and Review:   Exercise Goals     Row Name 02/04/22 1125             Exercise Goals   Increase Physical Activity Yes       Intervention Provide advice, education, support and counseling about physical activity/exercise needs.;Develop an individualized exercise prescription for aerobic and resistive training based on initial evaluation findings, risk stratification, comorbidities and participant's personal goals.       Expected Outcomes Short Term: Attend rehab on a regular basis to increase amount of physical activity.;Long Term: Add in home exercise  to make exercise part of routine and to increase amount of physical activity.;Long Term: Exercising regularly at least 3-5 days a week.       Able to understand and use rate of perceived exertion (RPE) scale Yes       Intervention Provide education and explanation on how to use RPE scale       Expected Outcomes Short Term: Able to use RPE daily in rehab to express subjective intensity level;Long Term:  Able to use RPE to guide intensity level when exercising independently       Able to understand and use Dyspnea scale Yes       Intervention Provide education and explanation on how to use Dyspnea scale       Expected Outcomes Short Term: Able to use Dyspnea scale daily in rehab to express subjective sense of shortness of breath during exertion;Long Term: Able to use Dyspnea scale to guide intensity level when exercising independently       Knowledge and understanding of Target Heart Rate Range (THRR) Yes       Intervention Provide education and explanation of THRR including how the numbers were predicted and where they are located for reference       Expected Outcomes Short Term: Able to state/look up THRR;Long Term: Able to use THRR to govern intensity when exercising independently;Short Term: Able to use daily as guideline for intensity in rehab       Understanding of Exercise Prescription Yes       Intervention Provide education, explanation, and written materials on patient's individual exercise prescription       Expected Outcomes Short Term: Able to explain program exercise prescription;Long Term: Able to explain home exercise prescription to exercise independently                Exercise Goals Re-Evaluation :  Exercise Goals Re-Evaluation     Row Name 02/08/22 1131 02/23/22 1425           Exercise Goal Re-Evaluation  Exercise Goals Review Increase Physical Activity;Able to understand and use rate of perceived exertion (RPE) scale --      Comments Patient able to understand and  use RPE scale appropriately. Patient out sick x2 weeks. Unable to review goals at this time.      Expected Outcomes Progress workloads as tolerated. Increase duration to 30 minutes as tolerated. Review goals upon return to cardiac rehab.               Discharge Exercise Prescription (Final Exercise Prescription Changes):  Exercise Prescription Changes - 02/08/22 1025       Response to Exercise   Blood Pressure (Admit) 130/80    Blood Pressure (Exercise) 118/82    Blood Pressure (Exit) 104/68    Heart Rate (Admit) 78 bpm    Heart Rate (Exercise) 98 bpm    Heart Rate (Exit) 77 bpm    Rating of Perceived Exertion (Exercise) 11    Symptoms None    Comments Bilateral inner thigh pain with exercise.    Duration Progress to 30 minutes of  aerobic without signs/symptoms of physical distress    Intensity THRR unchanged      Progression   Progression Continue to progress workloads to maintain intensity without signs/symptoms of physical distress.    Average METs 1.5      Resistance Training   Weight 2 lbs    Reps 10-15    Time 10 Minutes      Interval Training   Interval Training No      NuStep   Level 1    SPM 64    Minutes 20    METs 1.5             Nutrition:  Target Goals: Understanding of nutrition guidelines, daily intake of sodium <1550m, cholesterol <2071m calories 30% from fat and 7% or less from saturated fats, daily to have 5 or more servings of fruits and vegetables.  Biometrics:  Pre Biometrics - 02/04/22 1112       Pre Biometrics   Waist Circumference 50 inches    Hip Circumference 52 inches    Waist to Hip Ratio 0.96 %    Triceps Skinfold 30 mm    % Body Fat 50.4 %    Grip Strength 26 kg    Flexibility 10 in    Single Leg Stand 1.6 seconds              Nutrition Therapy Plan and Nutrition Goals:  Nutrition Therapy & Goals - 02/08/22 1125       Nutrition Therapy   Diet Heart Healthy/ Carbohydrate Consistent Diet    Drug/Food  Interactions Statins/Certain Fruits      Personal Nutrition Goals   Nutrition Goal Patient to identify strategies for managing cardiovascular risk by attending the weekly Pritikin educaiton and nutrition courses    Personal Goal #2 Patient to limit sodium intake to <150073mer day    Comments Gwen remains motivated to improve heart health, improve blood sugar control, and lose weight. Her blood sugar has improved and most recent A2c is down to 7.1. Her PCP has recommended fluid restriction of 2L daily (66oz/day).      Intervention Plan   Intervention Prescribe, educate and counsel regarding individualized specific dietary modifications aiming towards targeted core components such as weight, hypertension, lipid management, diabetes, heart failure and other comorbidities.;Nutrition handout(s) given to patient.    Expected Outcomes Short Term Goal: Understand basic principles of dietary content, such as calories,  fat, sodium, cholesterol and nutrients.;Long Term Goal: Adherence to prescribed nutrition plan.             Nutrition Assessments:  MEDIFICTS Score Key: ?70 Need to make dietary changes  40-70 Heart Healthy Diet ? 40 Therapeutic Level Cholesterol Diet    Picture Your Plate Scores: <84 Unhealthy dietary pattern with much room for improvement. 41-50 Dietary pattern unlikely to meet recommendations for good health and room for improvement. 51-60 More healthful dietary pattern, with some room for improvement.  >60 Healthy dietary pattern, although there may be some specific behaviors that could be improved.    Nutrition Goals Re-Evaluation:  Nutrition Goals Re-Evaluation     [place] Name 02/08/22 1125             Goals   Current Weight 236 lb 1.8 oz (107.1 kg)       Comment A1c 7.1, GFR 44, Cr 1.34, lipids WNL       Expected Outcome Gwen remains motivated to improve heart health, improve blood sugar control, and lose weight. Her blood sugar has improved and most recent A2c  is down to 7.1. Her PCP has recommended fluid restriction of 2L daily (66oz/day).                Nutrition Goals Re-Evaluation:  Nutrition Goals Re-Evaluation     [place] Name 02/08/22 1125             Goals   Current Weight 236 lb 1.8 oz (107.1 kg)       Comment A1c 7.1, GFR 44, Cr 1.34, lipids WNL       Expected Outcome Gwen remains motivated to improve heart health, improve blood sugar control, and lose weight. Her blood sugar has improved and most recent A2c is down to 7.1. Her PCP has recommended fluid restriction of 2L daily (66oz/day).                Nutrition Goals Discharge (Final Nutrition Goals Re-Evaluation):  Nutrition Goals Re-Evaluation - 02/08/22 1125       Goals   Current Weight 236 lb 1.8 oz (107.1 kg)    Comment A1c 7.1, GFR 44, Cr 1.34, lipids WNL    Expected Outcome Gwen remains motivated to improve heart health, improve blood sugar control, and lose weight. Her blood sugar has improved and most recent A2c is down to 7.1. Her PCP has recommended fluid restriction of 2L daily (66oz/day).             Psychosocial: Target Goals: Acknowledge presence or absence of significant depression and/or stress, maximize coping skills, provide positive support system. Participant is able to verbalize types and ability to use techniques and skills needed for reducing stress and depression.  Initial Review & Psychosocial Screening:  Initial Psych Review & Screening - 02/04/22 1056       Initial Review   Current issues with History of Depression;Current Depression   Gwen says she does feel depressed at times denies being depressed currently     [place]? Yes   Gwen lives with her daughter and 3 grandchildren who she has for support   Comments [name] denies being depressed currently. Gwen does not think that she needs couselling presently      Barriers   Psychosocial barriers to participate in program The patient should benefit from  training in stress management and relaxation.      Screening Interventions   Interventions Encouraged to exercise;Provide feedback about the  scores to participant    Expected Outcomes Long Term Goal: Stressors or current issues are controlled or eliminated.;Short Term goal: Identification and review with participant of any Quality of Life or Depression concerns found by scoring the questionnaire.;Long Term goal: The participant improves quality of Life and PHQ9 Scores as seen by post scores and/or verbalization of changes             Quality of Life Scores:  Quality of Life - 02/04/22 1136       Quality of Life   Select Quality of Life      Quality of Life Scores   Health/Function Pre 18.46 %    Socioeconomic Pre 20.17 %    Psych/Spiritual Pre 22 %    Family Pre 22.88 %    GLOBAL Pre 20.34 %            Scores of 19 and below usually indicate a poorer quality of life in these areas.  A difference of  2-3 points is a clinically meaningful difference.  A difference of 2-3 points in the total score of the Quality of Life Index has been associated with significant improvement in overall quality of life, self-image, physical symptoms, and general health in studies assessing change in quality of life.  PHQ-9: Review Flowsheet  More data exists      02/04/2022 01/20/2022 09/04/2021 06/01/2021 04/27/2021  Depression screen PHQ 2/9  Decreased Interest 1 0 0 0 0  Down, Depressed, Hopeless 0 0 0 0 0  PHQ - 2 Score 1 0 0 0 0  Altered sleeping 0 - 0 - 0  Tired, decreased energy 1 - 0 - 0  Change in appetite 1 - 0 - 0  Feeling bad or failure about yourself  0 - 0 - 0  Trouble concentrating 1 - 0 - 0  Moving slowly or fidgety/restless 0 - 0 - 0  Suicidal thoughts 0 - 0 - 0  PHQ-9 Score 4 - 0 - 0  Difficult doing work/chores Not difficult at all - Not difficult at all - -   Interpretation of Total Score  Total Score Depression Severity:  1-4 = Minimal depression, 5-9 = Mild  depression, 10-14 = Moderate depression, 15-19 = Moderately severe depression, 20-27 = Severe depression   Psychosocial Evaluation and Intervention:   Psychosocial Re-Evaluation:  Psychosocial Re-Evaluation     Row Name 02/09/22 1413 02/23/22 1653           Psychosocial Re-Evaluation   Current issues with Current Depression;History of Depression Current Depression;History of Depression      Comments Will review quality of life questionnaire in the upcoming week Reviewed quality of life on 02/10/22. gwen has not returned to exercise since then      Expected Outcomes Gwen will have controlled or decreased depression upon completion of intensive cardiac rehab Gwen will have controlled or decreased depression upon completion of intensive cardiac rehab      Interventions Stress management education;Encouraged to attend Cardiac Rehabilitation for the exercise Stress management education;Encouraged to attend Cardiac Rehabilitation for the exercise      Continue Psychosocial Services  Follow up required by staff Follow up required by staff               Psychosocial Discharge (Final Psychosocial Re-Evaluation):  Psychosocial Re-Evaluation - 02/23/22 1653       Psychosocial Re-Evaluation   Current issues with Current Depression;History of Depression    Comments Reviewed quality of life on 02/10/22. gwen  has not returned to exercise since then    Expected Outcomes Gwen will have controlled or decreased depression upon completion of intensive cardiac rehab    Interventions Stress management education;Encouraged to attend Cardiac Rehabilitation for the exercise    Continue Psychosocial Services  Follow up required by staff             Vocational Rehabilitation: Provide vocational rehab assistance to qualifying candidates.   Vocational Rehab Evaluation & Intervention:  Vocational Rehab - 02/04/22 1559       Initial Vocational Rehab Evaluation & Intervention   Assessment shows  need for Vocational Rehabilitation No   [name] is disabled and does not need vocational rehab at this time            Education: Education Goals: Education classes will be provided on a weekly basis, covering required topics. Participant will state understanding/return demonstration of topics presented.    Education     Row Name 02/08/22 1200     Education   Cardiac Education Topics Pritikin   Environmental consultant Psychosocial   Psychosocial Workshop Recognizing and Reducing Stress   Instruction Review Code 1- Verbalizes Understanding   Class Start Time 1140   Class Stop Time 1230   Class Time Calculation (min) 50 min    [place] Name 02/10/22 1300     Education   Cardiac Education Topics Pritikin   Financial trader   Weekly Topic Fast and Healthy Breakfasts   Instruction Review Code 1- Verbalizes Understanding   Class Start Time 1138   Class Stop Time 1225   Class Time Calculation (min) 47 min            Core Videos: Exercise    Move It!  Clinical staff conducted group or individual video education with verbal and written material and guidebook.  Patient learns the recommended Pritikin exercise program. Exercise with the goal of living a long, healthy life. Some of the health benefits of exercise include controlled diabetes, healthier blood pressure levels, improved cholesterol levels, improved heart and lung capacity, improved sleep, and better body composition. Everyone should speak with their doctor before starting or changing an exercise routine.  Biomechanical Limitations Clinical staff conducted group or individual video education with verbal and written material and guidebook.  Patient learns how biomechanical limitations can impact exercise and how we can mitigate and possibly overcome limitations to have an impactful and balanced exercise routine.  Body  Composition Clinical staff conducted group or individual video education with verbal and written material and guidebook.  Patient learns that body composition (ratio of muscle mass to fat mass) is a key component to assessing overall fitness, rather than body weight alone. Increased fat mass, especially visceral belly fat, can put [place] at increased risk for metabolic syndrome, type 2 diabetes, heart disease, and even death. It is recommended to combine diet and exercise (cardiovascular and resistance training) to improve your body composition. Seek guidance from your physician and exercise physiologist before implementing an exercise routine.  Exercise Action Plan Clinical staff conducted group or individual video education with verbal and written material and guidebook.  Patient learns the recommended strategies to achieve and enjoy long-term exercise adherence, including variety, self-motivation, self-efficacy, and positive decision making. Benefits of exercise include fitness, good health, weight management, more energy, better sleep, less stress, and overall well-being.  Medical  Heart Disease Risk Reduction Clinical staff conducted group or individual video education with verbal and written material and guidebook.  Patient learns our heart is our most vital organ as it circulates oxygen, nutrients, white blood cells, and hormones throughout the entire body, and carries waste away. Data supports a plant-based eating plan like the Pritikin Program for its effectiveness in slowing progression of and reversing heart disease. The video provides a number of recommendations to address heart disease.   Metabolic Syndrome and Belly Fat  Clinical staff conducted group or individual video education with verbal and written material and guidebook.  Patient learns what metabolic syndrome is, how it leads to heart disease, and how one can reverse it and keep it from coming back. You have metabolic syndrome if  you have 3 of the following 5 criteria: abdominal obesity, high blood pressure, high triglycerides, low HDL cholesterol, and high blood sugar.  Hypertension and Heart Disease Clinical staff conducted group or individual video education with verbal and written material and guidebook.  Patient learns that high blood pressure, or hypertension, is very common in the [place]. Hypertension is largely due to excessive salt intake, but other important risk factors include being overweight, physical inactivity, drinking too much alcohol, smoking, and not eating enough potassium from fruits and vegetables. High blood pressure is a leading risk factor for heart attack, stroke, congestive heart failure, dementia, kidney failure, and premature death. Long-term effects of excessive salt intake include stiffening of the arteries and thickening of heart muscle and organ damage. Recommendations include ways to reduce hypertension and the risk of heart disease.  Diseases of Our Time - Focusing on Diabetes Clinical staff conducted group or individual video education with verbal and written material and guidebook.  Patient learns why the best way to stop diseases of our time is prevention, through food and other lifestyle changes. Medicine (such as prescription pills and surgeries) is often only a Band-Aid on the problem, not a long-term solution. Most common diseases of our time include obesity, type 2 diabetes, hypertension, heart disease, and cancer. The Pritikin Program is recommended and has been proven to help reduce, reverse, and/or prevent the damaging effects of metabolic syndrome.  Nutrition   Overview of the Pritikin Eating Plan  Clinical staff conducted group or individual video education with verbal and written material and guidebook.  Patient learns about the [place] for disease risk reduction. The [place] emphasizes a wide variety of unrefined, minimally-processed  carbohydrates, like fruits, vegetables, whole grains, and legumes. Go, Caution, and Stop food choices are explained. Plant-based and lean animal proteins are emphasized. Rationale provided for low sodium intake for blood pressure control, low added sugars for blood sugar stabilization, and low added fats and oils for coronary artery disease risk reduction and weight management.  Calorie Density  Clinical staff conducted group or individual video education with verbal and written material and guidebook.  Patient learns about calorie density and how it impacts the Pritikin Eating Plan. Knowing the characteristics of the food you choose will help you decide whether those foods will lead to weight gain or weight loss, and whether you want to consume more or less of them. Weight loss is usually a side effect of the Pritikin Eating Plan because of its focus on low calorie-dense foods.  Label Reading  Clinical staff conducted group or individual video education with verbal and written material and guidebook.  Patient learns about the Pritikin recommended label reading guidelines and corresponding recommendations  regarding calorie density, added sugars, sodium content, and whole grains.  Dining Out - Part 1  Clinical staff conducted group or individual video education with verbal and written material and guidebook.  Patient learns that restaurant meals can be sabotaging because they can be so high in calories, fat, sodium, and/or sugar. Patient learns recommended strategies on how to positively address this and avoid unhealthy pitfalls.  Facts on Fats  Clinical staff conducted group or individual video education with verbal and written material and guidebook.  Patient learns that lifestyle modifications can be just as effective, if not more so, as many medications for lowering your risk of heart disease. A Pritikin lifestyle can help to reduce your risk of inflammation and atherosclerosis (cholesterol  build-up, or plaque, in the artery walls). Lifestyle interventions such as dietary choices and physical activity address the cause of atherosclerosis. A review of the types of fats and their impact on blood cholesterol levels, along with dietary recommendations to reduce fat intake is also included.  Nutrition Action Plan  Clinical staff conducted group or individual video education with verbal and written material and guidebook.  Patient learns how to incorporate Pritikin recommendations into their lifestyle. Recommendations include planning and keeping personal health goals in mind as an important part of their success.  Healthy Mind-Set    Healthy Minds, Bodies, Hearts  Clinical staff conducted group or individual video education with verbal and written material and guidebook.  Patient learns how to identify when they are stressed. Video will discuss the impact of that stress, as well as the many benefits of stress management. Patient will also be introduced to stress management techniques. The way we think, act, and feel has an impact on our hearts.  How Our Thoughts Can Heal Our Hearts  Clinical staff conducted group or individual video education with verbal and written material and guidebook.  Patient learns that negative thoughts can cause depression and anxiety. This can result in negative lifestyle behavior and serious health problems. Cognitive behavioral therapy is an effective method to help control our thoughts in order to change and improve our emotional outlook.  Additional Videos:  Exercise    Improving Performance  Clinical staff conducted group or individual video education with verbal and written material and guidebook.  Patient learns to use a non-linear approach by alternating intensity levels and lengths of time spent exercising to help burn more calories and lose more body fat. Cardiovascular exercise helps improve heart health, metabolism, hormonal balance, blood sugar  control, and recovery from fatigue. Resistance training improves strength, endurance, balance, coordination, reaction time, metabolism, and muscle mass. Flexibility exercise improves circulation, posture, and balance. Seek guidance from your physician and exercise physiologist before implementing an exercise routine and learn your capabilities and proper form for all exercise.  Introduction to Yoga  Clinical staff conducted group or individual video education with verbal and written material and guidebook.  Patient learns about yoga, a discipline of the coming together of mind, breath, and body. The benefits of yoga include improved flexibility, improved range of motion, better posture and core strength, increased lung function, weight loss, and positive self-image. Yoga's heart health benefits include lowered blood pressure, healthier heart rate, decreased cholesterol and triglyceride levels, improved immune function, and reduced stress. Seek guidance from your physician and exercise physiologist before implementing an exercise routine and learn your capabilities and proper form for all exercise.  Medical   Aging: Enhancing Your Quality of Life  Clinical staff conducted group or individual video  education with verbal and written material and guidebook.  Patient learns key strategies and recommendations to stay in good physical health and enhance quality of life, such as prevention strategies, having an advocate, securing a [place], and keeping a list of medications and system for tracking them. It also discusses how to avoid risk for bone loss.  Biology of Weight Control  Clinical staff conducted group or individual video education with verbal and written material and guidebook.  Patient learns that weight gain occurs because we consume more calories than we burn (eating more, moving less). Even if your body weight is normal, you may have higher ratios of fat compared to  muscle mass. Too much body fat puts you at increased risk for cardiovascular disease, heart attack, stroke, type 2 diabetes, and obesity-related cancers. In addition to exercise, following the [place] can help reduce your risk.  Decoding Lab Results  Clinical staff conducted group or individual video education with verbal and written material and guidebook.  Patient learns that lab test reflects one measurement whose values change over time and are influenced by many factors, including medication, stress, sleep, exercise, food, hydration, pre-existing medical conditions, and more. It is recommended to use the knowledge from this video to become more involved with your lab results and evaluate your numbers to speak with your doctor.   Diseases of Our Time - Overview  Clinical staff conducted group or individual video education with verbal and written material and guidebook.  Patient learns that according to the CDC, 50% to 70% of chronic diseases (such as obesity, type 2 diabetes, elevated lipids, hypertension, and heart disease) are avoidable through lifestyle improvements including healthier food choices, listening to satiety cues, and increased physical activity.  Sleep Disorders Clinical staff conducted group or individual video education with verbal and written material and guidebook.  Patient learns how good quality and duration of sleep are important to overall health and well-being. Patient also learns about sleep disorders and how they impact health along with recommendations to address them, including discussing with a physician.  Nutrition  Dining Out - Part 2 Clinical staff conducted group or individual video education with verbal and written material and guidebook.  Patient learns how to plan ahead and communicate in order to maximize their dining experience in a healthy and nutritious manner. Included are recommended food choices based on the type of restaurant the patient  is visiting.   Fueling a Best boy conducted group or individual video education with verbal and written material and guidebook.  There is a strong connection between our food choices and our health. Diseases like obesity and type 2 diabetes are very prevalent and are in large-part due to lifestyle choices. The Pritikin Eating Plan provides plenty of food and hunger-curbing satisfaction. It is easy to follow, affordable, and helps reduce health risks.  Menu Workshop  Clinical staff conducted group or individual video education with verbal and written material and guidebook.  Patient learns that restaurant meals can sabotage health goals because they are often packed with calories, fat, sodium, and sugar. Recommendations include strategies to plan ahead and to communicate with the manager, chef, or server to help order a healthier meal.  Planning Your Eating Strategy  Clinical staff conducted group or individual video education with verbal and written material and guidebook.  Patient learns about the [place] and its benefit of reducing the risk of disease. The Pritikin Eating Plan does  not focus on calories. Instead, it emphasizes high-quality, nutrient-rich foods. By knowing the characteristics of the foods, we choose, we can determine their calorie density and make informed decisions.  Targeting Your Nutrition Priorities  Clinical staff conducted group or individual video education with verbal and written material and guidebook.  Patient learns that lifestyle habits have a tremendous impact on disease risk and progression. This video provides eating and physical activity recommendations based on your personal health goals, such as reducing LDL cholesterol, losing weight, preventing or controlling type 2 diabetes, and reducing high blood pressure.  Vitamins and Minerals  Clinical staff conducted group or individual video education with verbal and written material  and guidebook.  Patient learns different ways to obtain key vitamins and minerals, including through a recommended healthy diet. It is important to discuss all supplements you take with your doctor.   Healthy Mind-Set    Smoking Cessation  Clinical staff conducted group or individual video education with verbal and written material and guidebook.  Patient learns that cigarette smoking and tobacco addiction pose a serious health risk which affects millions of people. Stopping smoking will significantly reduce the risk of heart disease, lung disease, and many forms of cancer. Recommended strategies for quitting are covered, including working with your doctor to develop a successful plan.  Culinary   Becoming a Financial trader conducted group or individual video education with verbal and written material and guidebook.  Patient learns that cooking at home can be healthy, cost-effective, quick, and puts them in control. Keys to cooking healthy recipes will include looking at your recipe, assessing your equipment needs, planning ahead, making it simple, choosing cost-effective seasonal ingredients, and limiting the use of added fats, salts, and sugars.  Cooking - Breakfast and Snacks  Clinical staff conducted group or individual video education with verbal and written material and guidebook.  Patient learns how important breakfast is to satiety and nutrition through the entire day. Recommendations include key foods to eat during breakfast to help stabilize blood sugar levels and to prevent overeating at meals later in the day. Planning ahead is also a key component.  Cooking - Human resources officer conducted group or individual video education with verbal and written material and guidebook.  Patient learns eating strategies to improve overall health, including an approach to cook more at home. Recommendations include thinking of animal protein as a side on your plate rather  than center stage and focusing instead on lower calorie dense options like vegetables, fruits, whole grains, and plant-based proteins, such as beans. Making sauces in large quantities to freeze for later and leaving the skin on your vegetables are also recommended to maximize your experience.  Cooking - Healthy Salads and Dressing Clinical staff conducted group or individual video education with verbal and written material and guidebook.  Patient learns that vegetables, fruits, whole grains, and legumes are the foundations of the [place]. Recommendations include how to incorporate each of these in flavorful and healthy salads, and how to create homemade salad dressings. Proper handling of ingredients is also covered. Cooking - Soups and [employer] - Soups and Desserts Clinical staff conducted group or individual video education with verbal and written material and guidebook.  Patient learns that Pritikin soups and desserts make for easy, nutritious, and delicious snacks and meal components that are low in sodium, fat, sugar, and calorie density, while high in vitamins, minerals, and filling fiber. Recommendations include simple and healthy ideas  for soups and desserts.   Overview     The Pritikin Solution Program Overview Clinical staff conducted group or individual video education with verbal and written material and guidebook.  Patient learns that the results of the [place] Program have been documented in more than 100 articles published in peer-reviewed journals, and the benefits include reducing risk factors for (and, in some cases, even reversing) high cholesterol, high blood pressure, type 2 diabetes, obesity, and more! An overview of the three key pillars of the Pritikin Program will be covered: eating well, doing regular exercise, and having a healthy mind-set.  WORKSHOPS  Exercise: Exercise Basics: Building Your Action Plan Clinical staff led group instruction and  group discussion with PowerPoint presentation and patient guidebook. To enhance the learning environment the use of posters, models and videos may be added. At the conclusion of this workshop, patients will comprehend the difference between physical activity and exercise, as well as the benefits of incorporating both, into their routine. Patients will understand the FITT (Frequency, Intensity, Time, and Type) principle and how to use it to build an exercise action plan. In addition, safety concerns and other considerations for exercise and cardiac rehab will be addressed by the presenter. The purpose of this lesson is to promote a comprehensive and effective weekly exercise routine in order to improve patients' overall level of fitness.   Managing Heart Disease: Your Path to a Healthier Heart Clinical staff led group instruction and group discussion with PowerPoint presentation and patient guidebook. To enhance the learning environment the use of posters, models and videos may be added.At the conclusion of this workshop, patients will understand the anatomy and physiology of the heart. Additionally, they will understand how Pritikin's three pillars impact the risk factors, the progression, and the management of heart disease.  The purpose of this lesson is to provide a high-level overview of the heart, heart disease, and how the Pritikin lifestyle positively impacts risk factors.  Exercise Biomechanics Clinical staff led group instruction and group discussion with PowerPoint presentation and patient guidebook. To enhance the learning environment the use of posters, models and videos may be added. Patients will learn how the structural parts of their bodies function and how these functions impact their daily activities, movement, and exercise. Patients will learn how to promote a neutral spine, learn how to manage pain, and identify ways to improve their physical movement in order to  promote healthy living. The purpose of this lesson is to expose patients to common physical limitations that impact physical activity. Participants will learn practical ways to adapt and manage aches and pains, and to minimize their effect on regular exercise. Patients will learn how to maintain good posture while sitting, walking, and lifting.  Balance Training and Fall Prevention  Clinical staff led group instruction and group discussion with PowerPoint presentation and patient guidebook. To enhance the learning environment the use of posters, models and videos may be added. At the conclusion of this workshop, patients will understand the importance of their sensorimotor skills (vision, proprioception, and the vestibular system) in maintaining their ability to balance as they age. Patients will apply a variety of balancing exercises that are appropriate for their current level of function. Patients will understand the common causes for poor balance, possible solutions to these problems, and ways to modify their physical environment in order to minimize their fall risk. The purpose of this lesson is to teach patients about the importance of maintaining balance as they age and ways to minimize  their risk of falling.  WORKSHOPS   Nutrition:  Fueling a Scientist, research (physical sciences) led group instruction and group discussion with PowerPoint presentation and patient guidebook. To enhance the learning environment the use of posters, models and videos may be added. Patients will review the foundational principles of the [place] and understand what constitutes a serving size in each of the food groups. Patients will also learn Pritikin-friendly foods that are better choices when away from home and review make-ahead meal and snack options. Calorie density will be reviewed and applied to three nutrition priorities: weight maintenance, weight loss, and weight gain. The purpose of this lesson is to  reinforce (in a group setting) the key concepts around what patients are recommended to eat and how to apply these guidelines when away from home by planning and selecting Pritikin-friendly options. Patients will understand how calorie density may be adjusted for different weight management goals.  Mindful Eating  Clinical staff led group instruction and group discussion with PowerPoint presentation and patient guidebook. To enhance the learning environment the use of posters, models and videos may be added. Patients will briefly review the concepts of the [place] and the importance of low-calorie dense foods. The concept of mindful eating will be introduced as well as the importance of paying attention to internal hunger signals. Triggers for non-hunger eating and techniques for dealing with triggers will be explored. The purpose of this lesson is to provide patients with the opportunity to review the basic principles of the [place], discuss the value of eating mindfully and how to measure internal cues of hunger and fullness using the Hunger Scale. Patients will also discuss reasons for non-hunger eating and learn strategies to use for controlling emotional eating.  Targeting Your Nutrition Priorities Clinical staff led group instruction and group discussion with PowerPoint presentation and patient guidebook. To enhance the learning environment the use of posters, models and videos may be added. Patients will learn how to determine their genetic susceptibility to disease by reviewing their family history. Patients will gain insight into the importance of diet as part of an overall healthy lifestyle in mitigating the impact of genetics and other environmental insults. The purpose of this lesson is to provide patients with the opportunity to assess their personal nutrition priorities by looking at their family history, their own health history and current risk factors. Patients will  also be able to discuss ways of prioritizing and modifying the [place] for their highest risk areas  Menu  Clinical staff led group instruction and group discussion with PowerPoint presentation and patient guidebook. To enhance the learning environment the use of posters, models and videos may be added. Using menus brought in from [employer], or printed from [employer], patients will apply the [place] dining out guidelines that were presented in the [employer] video. Patients will also be able to practice these guidelines in a variety of provided scenarios. The purpose of this lesson is to provide patients with the opportunity to practice hands-on learning of the [place] with actual menus and practice scenarios.  Label Reading Clinical staff led group instruction and group discussion with PowerPoint presentation and patient guidebook. To enhance the learning environment the use of posters, models and videos may be added. Patients will review and discuss the Pritikin label reading guidelines presented in Pritikin's Label Reading Educational series video. Using fool labels brought in from local grocery stores and markets, patients will  apply the label reading guidelines and determine if the packaged food meet the Pritikin guidelines. The purpose of this lesson is to provide patients with the opportunity to review, discuss, and practice hands-on learning of the Pritikin Label Reading guidelines with actual packaged food labels. [place] Workshops are designed to teach patients ways to prepare quick, simple, and affordable recipes at home. The importance of nutrition's role in chronic disease risk reduction is reflected in its emphasis in the overall Pritikin program. By learning how to prepare essential core Pritikin Eating Plan recipes, patients will increase control over what they eat; be able to customize the  flavor of foods without the use of added salt, sugar, or fat; and improve the quality of the food they consume. By learning a set of core recipes which are easily assembled, quickly prepared, and affordable, patients are more likely to prepare more healthy foods at home. These workshops focus on convenient breakfasts, simple entres, side dishes, and desserts which can be prepared with minimal effort and are consistent with nutrition recommendations for cardiovascular risk reduction. Cooking [employer] are taught by a Engineer, materials (RD) who has been trained by the [employer]. The chef or RD has a clear understanding of the importance of minimizing - if not completely eliminating - added fat, sugar, and sodium in recipes. Throughout the series of [place] Workshop sessions, patients will learn about healthy ingredients and efficient methods of cooking to build confidence in their capability to prepare    Cooking School weekly topics:  Adding Flavor- Sodium-Free  Fast and Healthy Breakfasts  Powerhouse Plant-Based Proteins  Satisfying Salads and Dressings  Simple Sides and Sauces  International Cuisine-Spotlight on the [place] Zones  Delicious Desserts  Savory Soups  Efficiency Cooking - Meals in a Snap  Tasty Appetizers and Snacks  Comforting Weekend Breakfasts  One-Pot Wonders   Fast Evening Meals  Easy [place] (Psychosocial): New Thoughts, New Behaviors Clinical staff led group instruction and group discussion with PowerPoint presentation and patient guidebook. To enhance the learning environment the use of posters, models and videos may be added. Patients will learn and practice techniques for developing effective health and lifestyle goals. Patients will be able to effectively apply the goal setting process learned to develop at least one new personal goal.  The purpose of this  lesson is to expose patients to a new skill set of behavior modification techniques such as techniques setting SMART goals, overcoming barriers, and achieving new thoughts and new behaviors.  Managing Moods and Relationships Clinical staff led group instruction and group discussion with PowerPoint presentation and patient guidebook. To enhance the learning environment the use of posters, models and videos may be added. Patients will learn how emotional and chronic stress factors can impact their health and relationships. They will learn healthy ways to manage their moods and utilize positive coping mechanisms. In addition, ICR patients will learn ways to improve communication skills. The purpose of this lesson is to expose patients to ways of understanding how one's mood and health are intimately connected. Developing a healthy outlook can help build positive relationships and connections with others. Patients will understand the importance of utilizing effective communication skills that include actively listening and being heard. They will learn and understand the importance of the "4 Cs" and especially Connections in fostering of a Healthy Mind-Set.  Healthy Sleep for a Healthy Heart Clinical  staff led group instruction and group discussion with PowerPoint presentation and patient guidebook. To enhance the learning environment the use of posters, models and videos may be added. At the conclusion of this workshop, patients will be able to demonstrate knowledge of the importance of sleep to overall health, well-being, and quality of life. They will understand the symptoms of, and treatments for, common sleep disorders. Patients will also be able to identify daytime and nighttime behaviors which impact sleep, and they will be able to apply these tools to help manage sleep-related challenges. The purpose of this lesson is to provide patients with a general overview of sleep and outline the importance of quality  sleep. Patients will learn about a few of the most common sleep disorders. Patients will also be introduced to the concept of "sleep hygiene," and discover ways to self-manage certain sleeping problems through simple daily behavior changes. Finally, the workshop will motivate patients by clarifying the links between quality sleep and their goals of heart-healthy living.   Recognizing and Reducing Stress Clinical staff led group instruction and group discussion with PowerPoint presentation and patient guidebook. To enhance the learning environment the use of posters, models and videos may be added. At the conclusion of this workshop, patients will be able to understand the types of stress reactions, differentiate between acute and chronic stress, and recognize the impact that chronic stress has on their health. They will also be able to apply different coping mechanisms, such as reframing negative self-talk. Patients will have the opportunity to practice a variety of stress management techniques, such as deep abdominal breathing, progressive muscle relaxation, and/or guided imagery.  The purpose of this lesson is to educate patients on the role of stress in their lives and to provide healthy techniques for coping with it.  Learning Barriers/Preferences:  Learning Barriers/Preferences - 02/04/22 1136       Learning Barriers/Preferences   Learning Barriers Sight;Exercise Concerns   Pt wears glasses, balance concerns, previous falls   Learning Preferences Audio;Group Instruction;Individual Instruction;Pictoral;Skilled Demonstration;Verbal Instruction             Education Topics:  Knowledge Questionnaire Score:  Knowledge Questionnaire Score - 02/04/22 1137       Knowledge Questionnaire Score   Pre Score 19/28             Core Components/Risk Factors/Patient Goals at Admission:  Personal Goals and Risk Factors at Admission - 02/04/22 1140       Core Components/Risk Factors/Patient  Goals on Admission    Weight Management Yes;Obesity;Weight Loss    Intervention Weight Management: Develop a combined nutrition and exercise program designed to reach desired caloric intake, while maintaining appropriate intake of nutrient and fiber, sodium and fats, and appropriate energy expenditure required for the weight goal.;Weight Management: Provide education and appropriate resources to help participant work on and attain dietary goals.;Weight Management/Obesity: Establish reasonable short term and long term weight goals.;Obesity: Provide education and appropriate resources to help participant work on and attain dietary goals.    Admit Weight 233 lb 4 oz (105.8 kg)    Expected Outcomes Short Term: Continue to assess and modify interventions until short term weight is achieved;Long Term: Adherence to nutrition and physical activity/exercise program aimed toward attainment of established weight goal;Weight Loss: Understanding of general recommendations for a balanced deficit meal plan, which promotes 1-2 lb weight loss per week and includes a negative energy balance of [phone] kcal/d;Understanding recommendations for meals to include 15-35% energy as protein, 25-35% energy from fat,  35-60% energy from carbohydrates, less than 253m of dietary cholesterol, 20-35 gm of total fiber daily;Understanding of distribution of calorie intake throughout the day with the consumption of 4-5 meals/snacks    Diabetes Yes    Intervention Provide education about signs/symptoms and action to take for hypo/hyperglycemia.;Provide education about proper nutrition, including hydration, and aerobic/resistive exercise prescription along with prescribed medications to achieve blood glucose in normal ranges: Fasting glucose 65-99 mg/dL    Expected Outcomes Short Term: Participant verbalizes understanding of the signs/symptoms and immediate care of hyper/hypoglycemia, proper foot care and importance of medication,  aerobic/resistive exercise and nutrition plan for blood glucose control.;Long Term: Attainment of HbA1C < 7%.    Heart Failure Yes    Intervention Provide a combined exercise and nutrition program that is supplemented with education, support and counseling about heart failure. Directed toward relieving symptoms such as shortness of breath, decreased exercise tolerance, and extremity edema.    Expected Outcomes Improve functional capacity of life;Short term: Attendance in program 2-3 days a week with increased exercise capacity. Reported lower sodium intake. Reported increased fruit and vegetable intake. Reports medication compliance.;Short term: Daily weights obtained and reported for increase. Utilizing diuretic protocols set by physician.;Long term: Adoption of self-care skills and reduction of barriers for early signs and symptoms recognition and intervention leading to self-care maintenance.    Hypertension Yes    Intervention Provide education on lifestyle modifcations including regular physical activity/exercise, weight management, moderate sodium restriction and increased consumption of fresh fruit, vegetables, and low fat dairy, alcohol moderation, and smoking cessation.;Monitor prescription use compliance.    Expected Outcomes Short Term: Continued assessment and intervention until BP is < 140/982mHG in hypertensive participants. < 130/806mG in hypertensive participants with diabetes, heart failure or chronic kidney disease.;Long Term: Maintenance of blood pressure at goal levels.    Lipids Yes    Intervention Provide education and support for participant on nutrition & aerobic/resistive exercise along with prescribed medications to achieve LDL <78m45mDL >40mg57m Expected Outcomes Short Term: Participant states understanding of desired cholesterol values and is compliant with medications prescribed. Participant is following exercise prescription and nutrition guidelines.;Long Term: Cholesterol  controlled with medications as prescribed, with individualized exercise RX and with personalized nutrition plan. Value goals: LDL < 78mg,15m > 40 mg.    Stress Yes    Intervention Offer individual and/or small group education and counseling on adjustment to heart disease, stress management and health-related lifestyle change. Teach and support self-help strategies.;Refer participants experiencing significant psychosocial distress to appropriate mental health specialists for further evaluation and treatment. When possible, include family members and significant others in education/counseling sessions.    Expected Outcomes Short Term: Participant demonstrates changes in health-related behavior, relaxation and other stress management skills, ability to obtain effective social support, and compliance with psychotropic medications if prescribed.;Long Term: Emotional wellbeing is indicated by absence of clinically significant psychosocial distress or social isolation.    Personal Goal Other Yes    Personal Goal Short term: get back to Ex, control SOB Long term: lose wt, better heart health    Intervention Will continue to monitor pt and progress workloads as tolerated without sign or symptom    Expected Outcomes Pt will achieve her goals             Core Components/Risk Factors/Patient Goals Review:   Goals and Risk Factor Review     Row Name 02/09/22 1416 02/23/22 1654  Core Components/Risk Factors/Patient Goals Review   Personal Goals Review Heart Failure;Weight Management/Obesity;Stress;Hypertension;Lipids;Diabetes Heart Failure;Weight Management/Obesity;Stress;Hypertension;Lipids;Diabetes      Review Gwen started intensive cardiac rehab on 02/08/22. Gwen did well with exercise for her fitness level. Vital signs and CBG's were stable. Gwen started intensive cardiac rehab on 02/08/22. Gwen did well with exercise for her fitness level. Vital signs and CBG's were stable. Gwen's last  exercise session was on 02/10/22.      Expected Outcomes Gwen will continue to participate in intensive cardiac rehab for exercise, nutrition and lifestyle modifications Gwen will continue to participate in intensive cardiac rehab for exercise, nutrition and lifestyle modifications               Core Components/Risk Factors/Patient Goals at Discharge (Final Review):   Goals and Risk Factor Review - 02/23/22 1654       Core Components/Risk Factors/Patient Goals Review   Personal Goals Review Heart Failure;Weight Management/Obesity;Stress;Hypertension;Lipids;Diabetes    Review Gwen started intensive cardiac rehab on 02/08/22. Gwen did well with exercise for her fitness level. Vital signs and CBG's were stable. Gwen's last exercise session was on 02/10/22.    Expected Outcomes Gwen will continue to participate in intensive cardiac rehab for exercise, nutrition and lifestyle modifications             ITP Comments:  ITP Comments     Row Name 02/04/22 1053 02/09/22 1409 02/23/22 1652       ITP Comments Dr Fransico Him MD, Medical Director. Introduction to [facility]/ Intensive Cardiac Rehab. Initial orientation packet reviewed with the patient 30 Day ITP Review. Gwen started intensive cardiac rehab on 02/08/22 and did well with exercise. 30 Day ITP Review. Gwen started intensive cardiac rehab on 02/08/22 and did well with exercise. gwen's last day of participaiton was on 02/10/22              Comments: See ITP comments.Harrell Gave RN BSN

## 2022-02-24 ENCOUNTER — Encounter (HOSPITAL_COMMUNITY)
Admission: RE | Admit: 2022-02-24 | Discharge: 2022-02-24 | Disposition: A | Discharge status: Home / Self Care | Payer: No Typology Code available for payment source | Source: Ambulatory Visit | Attending: Internal Medicine | Admitting: Internal Medicine

## 2022-02-24 ENCOUNTER — Telehealth: Payer: Self-pay | Admitting: Family Medicine

## 2022-02-24 DIAGNOSIS — I272 Pulmonary hypertension, unspecified: Secondary | ICD-10-CM | POA: Diagnosis not present

## 2022-02-24 DIAGNOSIS — I5032 Chronic diastolic (congestive) heart failure: Secondary | ICD-10-CM | POA: Insufficient documentation

## 2022-02-24 LAB — GLUCOSE, CAPILLARY
Glucose-Capillary: 107 mg/dL — ABNORMAL HIGH (ref 70–99)
Glucose-Capillary: 161 mg/dL — ABNORMAL HIGH (ref 70–99)

## 2022-02-24 NOTE — Telephone Encounter (Signed)
Patient called in checking status of if samples of metoprolol tartrate (LOPRESSOR) 50 MG table  will be sent until she receives from mail order.

## 2022-02-24 NOTE — Telephone Encounter (Signed)
Call placed to pt and she was informed that we do not have any samples.

## 2022-02-24 NOTE — Progress Notes (Signed)
Gwen returned to exercise today. Pre exercise CBG 107. Patient ate a chicken biscuit recheck CBG 161. [name] says she is out of several of her medications as she says she is getting her medications filled through mail order. [name] [facility]. Patient is currently out of her atorvastatin,  metoprolol and pantoprazole. Spoke with Demetrius at Dr [employer] office who spoke with the patient over the phone.Harrell Gave RN BSN

## 2022-02-24 NOTE — Telephone Encounter (Signed)
Pt has been called and informed of medication being refilled.

## 2022-02-24 NOTE — Telephone Encounter (Signed)
Pt called also needing Atorvastatin and Metoprolol, she is waiting for her mail service delivery. She was told that they are waiting for correspondence from the clinic. She wants to know if the office can provide her samples or a short supply to last? Please advise pt wants a call back   [phone]  Medication Refill - Medication: pantoprazole (PROTONIX) 40 MG tablet   Has the patient contacted their pharmacy? Yes.   (Agent: If no, request that the patient contact the pharmacy for the refill. If patient does not wish to contact the pharmacy document the reason why and proceed with request.) (Agent: If yes, when and what did the pharmacy advise?)  Preferred Pharmacy (with phone number or street name):  [id number] [place] [address] [id number] Phone: [phone]: [phone]  Has the patient been seen for an appointment in the last year OR does the patient have an upcoming appointment? Yes.    Agent: Please be advised that RX refills may take up to 3 business days. We ask that you follow-up with your pharmacy..Marland Kitchen

## 2022-02-26 ENCOUNTER — Encounter (HOSPITAL_COMMUNITY): Payer: No Typology Code available for payment source

## 2022-03-01 ENCOUNTER — Encounter (HOSPITAL_COMMUNITY)
Admission: RE | Admit: 2022-03-01 | Discharge: 2022-03-01 | Disposition: A | Discharge status: Home / Self Care | Payer: No Typology Code available for payment source | Source: Ambulatory Visit | Attending: Internal Medicine | Admitting: Internal Medicine

## 2022-03-01 ENCOUNTER — Other Ambulatory Visit (HOSPITAL_COMMUNITY): Payer: Self-pay | Admitting: *Deleted

## 2022-03-01 DIAGNOSIS — I152 Hypertension secondary to endocrine disorders: Secondary | ICD-10-CM

## 2022-03-01 DIAGNOSIS — I5032 Chronic diastolic (congestive) heart failure: Secondary | ICD-10-CM

## 2022-03-01 MED ORDER — METOPROLOL TARTRATE 50 MG PO TABS: 50.0000 mg | ORAL_TABLET | Freq: Two times a day (BID) | ORAL | 0 refills | Status: DC

## 2022-03-01 NOTE — Progress Notes (Signed)
Resting heart rate noted at 100. Blood pressure 142/80. Gwen has been out of her metoprolol for over a week. Left a message with the heart failure clinic to see if a [facility] day supply [facility] [address].Harrell Gave RN BSN

## 2022-03-03 ENCOUNTER — Encounter (HOSPITAL_COMMUNITY)
Admission: RE | Admit: 2022-03-03 | Discharge: 2022-03-03 | Disposition: A | Discharge status: Home / Self Care | Payer: No Typology Code available for payment source | Source: Ambulatory Visit | Attending: Internal Medicine | Admitting: Internal Medicine

## 2022-03-03 DIAGNOSIS — I5032 Chronic diastolic (congestive) heart failure: Secondary | ICD-10-CM

## 2022-03-03 NOTE — Progress Notes (Signed)
Gwen said that she received her metoprolol in the mail and has started taking it again.Harrell Gave RN BSN

## 2022-03-03 NOTE — Progress Notes (Signed)
Reviewed home exercise guidelines with patient including endpoints, temperature precautions, target heart rate and rate of perceived exertion. Patient plans to walk at the park 10 minutes 1-2 days/week as her mode of home exercise. Patient will add a minute each session as tolerated with a goal of achieving 30 minutes 1-2 days/week. Patient voices understanding of instructions given.  Sol Passer, MS, ACSM CEP

## 2022-03-05 ENCOUNTER — Encounter (HOSPITAL_COMMUNITY)
Admission: RE | Admit: 2022-03-05 | Discharge: 2022-03-05 | Disposition: A | Discharge status: Home / Self Care | Payer: No Typology Code available for payment source | Source: Ambulatory Visit | Attending: Internal Medicine | Admitting: Internal Medicine

## 2022-03-05 DIAGNOSIS — I5032 Chronic diastolic (congestive) heart failure: Secondary | ICD-10-CM | POA: Diagnosis not present

## 2022-03-08 ENCOUNTER — Encounter (HOSPITAL_COMMUNITY)
Admission: RE | Admit: 2022-03-08 | Discharge: 2022-03-08 | Disposition: A | Discharge status: Home / Self Care | Payer: No Typology Code available for payment source | Source: Ambulatory Visit | Attending: Internal Medicine | Admitting: Internal Medicine

## 2022-03-08 DIAGNOSIS — I5032 Chronic diastolic (congestive) heart failure: Secondary | ICD-10-CM | POA: Diagnosis not present

## 2022-03-10 ENCOUNTER — Encounter (HOSPITAL_COMMUNITY)
Admission: RE | Admit: 2022-03-10 | Discharge: 2022-03-10 | Disposition: A | Discharge status: Home / Self Care | Payer: No Typology Code available for payment source | Source: Ambulatory Visit | Attending: Internal Medicine | Admitting: Internal Medicine

## 2022-03-10 DIAGNOSIS — I272 Pulmonary hypertension, unspecified: Secondary | ICD-10-CM

## 2022-03-10 DIAGNOSIS — I5032 Chronic diastolic (congestive) heart failure: Secondary | ICD-10-CM

## 2022-03-12 ENCOUNTER — Encounter (HOSPITAL_COMMUNITY): Payer: No Typology Code available for payment source

## 2022-03-15 ENCOUNTER — Encounter (HOSPITAL_COMMUNITY): Payer: No Typology Code available for payment source

## 2022-03-17 ENCOUNTER — Encounter (HOSPITAL_COMMUNITY): Payer: No Typology Code available for payment source

## 2022-03-19 ENCOUNTER — Encounter (HOSPITAL_COMMUNITY): Payer: No Typology Code available for payment source

## 2022-03-19 ENCOUNTER — Telehealth (HOSPITAL_COMMUNITY): Payer: Self-pay | Admitting: *Deleted

## 2022-03-19 NOTE — Telephone Encounter (Signed)
Left message to call cardiac rehab.Barnet Pall, RN,BSN 03/19/2022 10:31 AM

## 2022-03-22 ENCOUNTER — Ambulatory Visit: Payer: Self-pay | Admitting: *Deleted

## 2022-03-22 ENCOUNTER — Encounter (HOSPITAL_COMMUNITY)
Admission: RE | Admit: 2022-03-22 | Discharge: 2022-03-22 | Disposition: A | Discharge status: Home / Self Care | Payer: No Typology Code available for payment source | Source: Ambulatory Visit | Attending: Internal Medicine | Admitting: Internal Medicine

## 2022-03-22 DIAGNOSIS — F17210 Nicotine dependence, cigarettes, uncomplicated: Secondary | ICD-10-CM | POA: Diagnosis not present

## 2022-03-22 DIAGNOSIS — Z5189 Encounter for other specified aftercare: Secondary | ICD-10-CM | POA: Insufficient documentation

## 2022-03-22 DIAGNOSIS — I5032 Chronic diastolic (congestive) heart failure: Secondary | ICD-10-CM | POA: Diagnosis not present

## 2022-03-22 DIAGNOSIS — E785 Hyperlipidemia, unspecified: Secondary | ICD-10-CM | POA: Insufficient documentation

## 2022-03-22 DIAGNOSIS — I110 Hypertensive heart disease with heart failure: Secondary | ICD-10-CM | POA: Insufficient documentation

## 2022-03-22 DIAGNOSIS — Z951 Presence of aortocoronary bypass graft: Secondary | ICD-10-CM | POA: Insufficient documentation

## 2022-03-22 DIAGNOSIS — E119 Type 2 diabetes mellitus without complications: Secondary | ICD-10-CM | POA: Diagnosis not present

## 2022-03-22 NOTE — Telephone Encounter (Signed)
2nd call attempted [phone] to review sx right foot swelling. No answer, LVMTCB [phone].

## 2022-03-22 NOTE — Telephone Encounter (Signed)
Noted  

## 2022-03-22 NOTE — Telephone Encounter (Signed)
  Chief Complaint: Swollen right foot and ankle Symptoms: Above Frequency: has a hx of same. Started again last week Pertinent Negatives: Patient denies Fever, chest pain Disposition: '[]'$ ED /'[x]'$ Urgent Care (no appt availability in office) / '[]'$ Appointment(In office/virtual)/ '[]'$  [facility]/ '[]'$ Home Care/ '[]'$ Refused Recommended Disposition /'[]'$  Mobile Bus/ '[]'$  Follow-up with PCP Additional Notes: PT states that her foot swelled up in April and then mostly resolved. Pt was given water pills at that time. Swelling started again last week.  Pt states that she took 1 advil which brought the pain down to a 7/10.  Reason for Disposition  [1] SEVERE pain (e.g., excruciating, unable to walk) AND [2] not improved after 2 hours of pain medicine  Answer Assessment - Initial Assessment Questions 1. LOCATION: "Which ankle is swollen?" "Where is the swelling?"     Right foot to ankle 2. ONSET: "When did the swelling start?"     April - off and on since then. Got much worse last week.3. SWELLING: "How bad is the swelling?" Or, "How large is it?" (e.g., mild, moderate, severe; size of localized swelling)    - NONE: No joint swelling.   - LOCALIZED: Localized; small area of puffy or swollen skin (e.g., insect bite, skin irritation).   - MILD: Joint looks or feels mildly swollen or puffy.   - MODERATE: Swollen; interferes with normal activities (e.g., work or school); decreased range of movement; may be limping.   - SEVERE: Very swollen; can't move swollen joint at all; limping a lot or unable to walk.     Moderate 4. PAIN: "Is there any pain?" If Yes, ask: "How bad is it?" (Scale 1-10; or mild, moderate, severe)   - NONE (0): no pain.   - MILD (1-3): doesn't interfere with normal activities.    - MODERATE (4-7): interferes with normal activities (e.g., work or school) or awakens from sleep, limping.    - SEVERE (8-10): excruciating pain, unable to do any normal activities, unable to walk.       7/10 5. CAUSE: "What do you think caused the ankle swelling?"     Unsure - gout, arthritis, or something bit her 6. OTHER SYMPTOMS: "Do you have any other symptoms?" (e.g., fever, chest pain, difficulty breathing, calf pain)     no 7. PREGNANCY: "Is there any chance you are pregnant?" "When was your last menstrual period?"  Protocols used: Ankle Swelling-A-AH

## 2022-03-22 NOTE — Telephone Encounter (Signed)
Summary: Swelling in feet   Pt has swelling in her right foot, she is unsure whether this is gout or arthritis. Please advise, seeking appt. Nothing available  Best contact: [phone]      Called patient at [phone] to review sx of foot swelling. No answer, LVMTCB [phone].

## 2022-03-22 NOTE — Progress Notes (Signed)
Cardiac Individual Treatment Plan  Patient Details  Name: Deborah Coffey MRN: [id number] Date of Birth: 1955/06/20 Referring Provider:   Flowsheet Row INTENSIVE CARDIAC REHAB ORIENT from 02/04/2022 in [facility]  Referring Provider Dr. Glori Bickers, MD       Initial Encounter Date:  [place] from 02/04/2022 in [facility]  Date 02/04/22       Visit Diagnosis: Heart failure, diastolic, chronic ([place])  Patient's Home Medications on Admission:  Current Outpatient Medications:    acetaminophen (TYLENOL) 500 MG tablet, Take 1,000 mg by mouth every 6 (six) hours as needed for headache (pain). , Disp: , Rfl:    Alcohol Swabs (B-D SINGLE USE SWABS REGULAR) PADS, USE AS DIRECTED, Disp: 100 each, Rfl: 0   aspirin EC 81 MG tablet, Take 81 mg by mouth daily., Disp: , Rfl:    atorvastatin (LIPITOR) 10 MG tablet, Take 1 tablet (10 mg total) by mouth once daily. (Patient not taking: Reported on 02/24/2022), Disp: 90 tablet, Rfl: 0   Blood Glucose Monitoring Suppl (ONETOUCH VERIO) w/Device KIT, Use as instructed to test blood glucose 3 times daily., Disp: 1 kit, Rfl: 0   clobetasol cream (TEMOVATE) 0.05 %, Apply topically 2 (two) times daily. (Patient taking differently: Apply 1 Application topically daily as needed (Rash).), Disp: 60 g, Rfl: 0   Continuous Blood Gluc Sensor (DEXCOM G6 SENSOR) MISC, Use to check blood sugar three times daily. Dx. E11.59 (Patient not taking: Reported on 02/24/2022), Disp: 3 each, Rfl: 3   dapagliflozin propanediol (FARXIGA) 5 MG TABS tablet, Take 1 tablet (5 mg total) by mouth daily before breakfast. (Patient not taking: Reported on 02/24/2022), Disp: 90 tablet, Rfl: 0   diclofenac Sodium (VOLTAREN) 1 % GEL, Apply 2 g topically daily as needed (muscle pain)., Disp: , Rfl:    furosemide (LASIX) 20 MG tablet,  Take 2 tablets (40 mg total) by mouth 2 (two) times daily., Disp: 360 tablet, Rfl: 0   gabapentin (NEURONTIN) 300 MG capsule, Take orally 2 caps (600 mg) in the morning and 3 capsules (900 mg) in the evening, Disp: 450 capsule, Rfl: 1   glucose blood (ONETOUCH VERIO) test strip, Use as instructed to test blood glucose 3 times daily., Disp: 100 each, Rfl: 3   insulin glargine, 2 Unit Dial, (TOUJEO MAX SOLOSTAR) 300 UNIT/ML Solostar Pen, Inject 56 Units into the skin daily., Disp: 6 mL, Rfl: 2   Insulin Pen Needle (DROPLET PEN NEEDLES) 31G X 8 MM MISC, USE WITH TOUJEO SOLOSTAR PEN EVERY DAY, Disp: 100 each, Rfl: 0   Lancets (ONETOUCH DELICA PLUS [id number]) MISC, Use as instructed to test blood glucose 3 times daily., Disp: 100 each, Rfl: 3   lisinopril (ZESTRIL) 20 MG tablet, Take 0.5 tablets (10 mg total) by mouth daily., Disp: 30 tablet, Rfl: 6   metFORMIN (GLUCOPHAGE-XR) 500 MG 24 hr tablet, Take 2 tablets (1,000 mg total) by mouth 2 (two) times daily., Disp: 360 tablet, Rfl: 0   metoprolol tartrate (LOPRESSOR) 50 MG tablet, Take 1 tablet (50 mg total) by mouth 2 (two) times daily., Disp: 14 tablet, Rfl: 0   Multiple Vitamin (MULTIVITAMIN WITH MINERALS) TABS tablet, Take 1 tablet by mouth daily., Disp: , Rfl:    nitroGLYCERIN (NITROSTAT) 0.4 MG SL tablet, Place 1 tablet (0.4 mg total) under the tongue every 5 (five) minutes as needed for chest pain. (Patient  not taking: Reported on 02/24/2022), Disp: 25 tablet, Rfl: 3   pantoprazole (PROTONIX) 40 MG tablet, Take 1 tablet (40 mg total) by mouth 2 (two) times daily. 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 competitive flow   TIA (transient ischemic attack)    Type II diabetes mellitus (HCC)     Tobacco Use: Social History   Tobacco Use  Smoking Status Some Days   Packs/day: 0.25   Years: 42.00   Total pack years: 10.50   Types: Cigarettes  Smokeless Tobacco Never  Tobacco Comments   Smokes 1-3 cigars/ black cigarettes a week. Agreed to referral to [place] quit line. Referral faxed. Patient given cessation information    Labs: Review Flowsheet  More data exists      Latest Ref Rng & Units 01/21/2021 04/16/2021 04/27/2021 08/31/2021 01/20/2022  Labs for ITP Cardiac and Pulmonary Rehab  Cholestrol 100 - 199 mg/dL 161  - - - -  LDL (calc) 0 - 99 mg/dL 70  - - - -  HDL-C >39 mg/dL 72  - - - -  Trlycerides 0 - 149 mg/dL 104  - - - -  Hemoglobin A1c 0.0 - 7.0 % 9.4  - 7.8  8.2  7.1   PH, Arterial 7.350 - 7.450 - 7.355  - - -  PCO2 arterial 32.0 - 48.0 mmHg - 39.7  - - -  Bicarbonate 20.0 - 28.0 mmol/L - 22.2  23.6  25.4  - - -  TCO2 22 - 32 mmol/L - _0 - - -  Acid-base deficit 0.0 - 2.0 mmol/L - 3.0  2.0  - - -  O2 Saturation % - 97.0  70.0  72.0  - - -    Capillary Blood Glucose: Lab Results  Component Value Date   GLUCAP 161 (H) 02/24/2022   GLUCAP 107 (H) 02/24/2022   GLUCAP 225 (H) 02/10/2022   GLUCAP 235 (H) 02/10/2022   GLUCAP 182 (H) 02/08/2022     Exercise Target Goals: Exercise Program Goal: Individual exercise prescription set using results from initial 6 min walk test and THRR while considering  patient's activity barriers and safety.   Exercise Prescription Goal: Initial exercise prescription builds to 30-45 minutes a day of aerobic activity, 2-3 days per week.  Home exercise guidelines will be given to patient  during program as part of exercise prescription that the participant will acknowledge.  Activity Barriers & Risk Stratification:  Activity Barriers & Cardiac Risk Stratification - 02/04/22 1123       Activity Barriers & Cardiac Risk Stratification   Activity Barriers Decreased Ventricular Function;Back Problems;Balance Concerns;Deconditioning;History of Falls;Assistive Device;Shortness of Breath    Cardiac Risk Stratification High  6 Minute Walk:  6 Minute Walk     Row Name 02/04/22 1113         6 Minute Walk   Phase Initial  Pt uses cane and did not bring it, Nustep test due to balance issues and previous falls     Distance 1017 feet  Nustep test     Walk Time 6 minutes     # of Rest Breaks 1  4.55-5.21 rest break due to fatigue and SOB     MPH 1.93     METS 1.79     RPE 11     Perceived Dyspnea  1     VO2 Peak 6.27     Symptoms Yes (comment)     Comments Rest break 4.55-5.21 due to fatigue and SOB (Dys 1) Nustep test info: AVG SPM: 65, AVG WATTS: 12, Total steps: 356, Dist Km: 0.31, AVG MET's: 1.4     Resting HR 91 bpm     Resting BP 122/68     Resting Oxygen Saturation  98 %     Exercise Oxygen Saturation  during 6 min walk 97 %     Max Ex. HR 95 bpm     Max Ex. BP 107/70     2 Minute Post BP 118/74              Oxygen Initial Assessment:   Oxygen Re-Evaluation:   Oxygen Discharge (Final Oxygen Re-Evaluation):   Initial Exercise Prescription:  Initial Exercise Prescription - 02/04/22 1100       Date of Initial Exercise RX and Referring Provider   Date 02/04/22    Referring Provider Dr. Glori Bickers, MD    Expected Discharge Date 04/02/22      NuStep   Level 1    SPM 60    Minutes 20    METs 1.6      Prescription Details   Frequency (times per week) 3    Duration Progress to 30 minutes of continuous aerobic without signs/symptoms of physical distress      Intensity   THRR 40-80% of Max Heartrate 62-125    Ratings of  Perceived Exertion 11-13    Perceived Dyspnea 0-4      Progression   Progression Continue progressive overload as per policy without signs/symptoms or physical distress.      Resistance Training   Training Prescription Yes    Weight 2 lbs    Reps 10-15             Perform Capillary Blood Glucose checks as needed.  Exercise Prescription Changes:   Exercise Prescription Changes     Row Name 02/08/22 1025 02/24/22 1033 03/08/22 1034 03/22/22 1027       Response to Exercise   Blood Pressure (Admit) 130/80 160/70 108/70 124/78    Blood Pressure (Exercise) 118/82 134/70 130/64 136/70    Blood Pressure (Exit) 104/68 122/68 108/62 122/78    Heart Rate (Admit) 78 bpm 101 bpm 85 bpm 86 bpm    Heart Rate (Exercise) 98 bpm 125 bpm 85 bpm 91 bpm    Heart Rate (Exit) 77 bpm 112 bpm 76 bpm 70 bpm    Rating of Perceived Exertion (Exercise) _0 Symptoms None None None None    Comments Bilateral inner thigh pain with exercise. Patient returned to exercise today after being out sick and tolerated well. Increased WL on NuStep. Patient returned today after being out due to swelling in  left foot.    Duration Progress to 30 minutes of  aerobic without signs/symptoms of physical distress Progress to 30 minutes of  aerobic without signs/symptoms of physical distress Progress to 30 minutes of  aerobic without signs/symptoms of physical distress Progress to 30 minutes of  aerobic without signs/symptoms of physical distress    Intensity THRR unchanged THRR unchanged THRR unchanged THRR unchanged      Progression   Progression Continue to progress workloads to maintain intensity without signs/symptoms of physical distress. Continue to progress workloads to maintain intensity without signs/symptoms of physical distress. Continue to progress workloads to maintain intensity without signs/symptoms of physical distress. Continue to progress workloads to maintain intensity without signs/symptoms of  physical distress.    Average METs 1.5 1.6 1.5 1.6      Resistance Training   Training Prescription -- No  Relaxation day, no weights. Yes Yes    Weight 2 lbs -- 2 lbs 3 lbs    Reps 10-15 -- 10-15 10-15    Time 10 Minutes -- 10 Minutes 10 Minutes      Interval Training   Interval Training No No No No      NuStep   Level _0 SPM 64 69 68 71    Minutes _1 METs 1.5 1.6 1.5 1.6      Home Exercise Plan   Plans to continue exercise at -- -- Home (comment)  Walking Home (comment)  Walking    Frequency -- -- Add 2 additional days to program exercise sessions. Add 2 additional days to program exercise sessions.    Initial Home Exercises Provided -- -- 03/03/22 03/03/22             Exercise Comments:   Exercise Comments     Row Name 02/08/22 1131 02/24/22 1055 03/03/22 1054 03/08/22 1149 03/22/22 1027   Exercise Comments Patient tolerated low intensity fair, c/o bilateral inner thigh pain, rest breaks taken. Oriented to warm-up and cool-down, some stretches modified for the chair. Reviewed METs and goals with patient. Reviewed home exercise guidelines and goals with patient. Reviewed METs with patient. Increased WL on NuStep today and tolerated well. Reviewed METs and goals with patient. Increased hand weights from 2 to 3 lbs today.            Exercise Goals and Review:   Exercise Goals     Row Name 02/04/22 1125             Exercise Goals   Increase Physical Activity Yes       Intervention Provide advice, education, support and counseling about physical activity/exercise needs.;Develop an individualized exercise prescription for aerobic and resistive training based on initial evaluation findings, risk stratification, comorbidities and participant's personal goals.       Expected Outcomes Short Term: Attend rehab on a regular basis to increase amount of physical activity.;Long Term: Add in home exercise to make exercise part of routine and to increase  amount of physical activity.;Long Term: Exercising regularly at least 3-5 days a week.       Able to understand and use rate of perceived exertion (RPE) scale Yes       Intervention Provide education and explanation on how to use RPE scale       Expected Outcomes Short Term: Able to use RPE daily in rehab to express subjective intensity level;Long Term:  Able to use RPE to guide intensity level when exercising  independently       Able to understand and use Dyspnea scale Yes       Intervention Provide education and explanation on how to use Dyspnea scale       Expected Outcomes Short Term: Able to use Dyspnea scale daily in rehab to express subjective sense of shortness of breath during exertion;Long Term: Able to use Dyspnea scale to guide intensity level when exercising independently       Knowledge and understanding of Target Heart Rate Range (THRR) Yes       Intervention Provide education and explanation of THRR including how the numbers were predicted and where they are located for reference       Expected Outcomes Short Term: Able to state/look up THRR;Long Term: Able to use THRR to govern intensity when exercising independently;Short Term: Able to use daily as guideline for intensity in rehab       Understanding of Exercise Prescription Yes       Intervention Provide education, explanation, and written materials on patient's individual exercise prescription       Expected Outcomes Short Term: Able to explain program exercise prescription;Long Term: Able to explain home exercise prescription to exercise independently                Exercise Goals Re-Evaluation :  Exercise Goals Re-Evaluation     Row Name 02/08/22 1131 02/23/22 1425 02/24/22 1055 03/03/22 1054 03/05/22 1035     Exercise Goal Re-Evaluation   Exercise Goals Review Increase Physical Activity;Able to understand and use rate of perceived exertion (RPE) scale -- Increase Physical Activity;Able to understand and use rate of  perceived exertion (RPE) scale;Increase Strength and Stamina Increase Physical Activity;Able to understand and use rate of perceived exertion (RPE) scale;Increase Strength and Stamina;Knowledge and understanding of Target Heart Rate Range (THRR);Able to check pulse independently;Understanding of Exercise Prescription Increase Physical Activity;Able to understand and use rate of perceived exertion (RPE) scale;Increase Strength and Stamina;Knowledge and understanding of Target Heart Rate Range (THRR);Able to check pulse independently;Understanding of Exercise Prescription   Comments Patient able to understand and use RPE scale appropriately. Patient out sick x2 weeks. Unable to review goals at this time. Patient returned to exercise today after being out sick and tolerated exercise well. Patient's goal is to increase strength  and have less pain in her legs and feet. Reviewed exercisce prescription with Deborah Coffey. Deborah Coffey is not currently exercising at home but plans to start walking in the park. She will start walking ~48mnutes, 1-2 days/week and add 1 minute each session with a goal of achieving 30 minutes 1-2 days/week. Deborah Coffey expressed interest in using the treadmill. [facility] but that's been ~8 months ago. Will incoporate the TM into her exercise prescription when she's ready. She states that she has already seen a difference in the amount of shortness of breath she experiences. She would like to lose weight as one of her goals. The added walking at home will help in addition to adhering to a heart healhty nutrition plan. Patient knows how to count her pulse. Patient states she walked at the store yesterday and tolerated well taking rest breaks. Patient plans to walk again this week at the park. Patient will hold off on starting the treadmill until she builds up her stamina.Encouraged patient to increase steps/minute on the NuStep, and patient is amenable to this. Will  increase workload on NuStep next week.   Expected Outcomes Progress workloads as  tolerated. Increase duration to 30 minutes as tolerated. Review goals upon return to cardiac rehab. Progress workloads as tolerated to help increase strength. Patient will add walking 1-2 days/week at the park or in her house in addition to exercise at cardiac reahb to achieve 150 minutes of aerobic exercise/week. Increaes SPM and WL on the NuStep to increase stamina.    [place] Name 03/22/22 1027             Exercise Goal Re-Evaluation   Exercise Goals Review Increase Physical Activity;Able to understand and use rate of perceived exertion (RPE) scale;Increase Strength and Stamina;Knowledge and understanding of Target Heart Rate Range (THRR);Able to check pulse independently;Understanding of Exercise Prescription       Comments Patient returned to exercise today after being out with swelling in her left foot. Patient has also been unable to walk at home for exercise due to edema in foot. Patient tolerated low intensity exercise without symptoms. Increased hand weights from 2 to 3 lbs today.       Expected Outcomes Progress workloads as tolerated to regain strength and stamina.                Discharge Exercise Prescription (Final Exercise Prescription Changes):  Exercise Prescription Changes - 03/22/22 1027       Response to Exercise   Blood Pressure (Admit) 124/78    Blood Pressure (Exercise) 136/70    Blood Pressure (Exit) 122/78    Heart Rate (Admit) 86 bpm    Heart Rate (Exercise) 91 bpm    Heart Rate (Exit) 70 bpm    Rating of Perceived Exertion (Exercise) 10    Symptoms None    Comments Patient returned today after being out due to swelling in left foot.    Duration Progress to 30 minutes of  aerobic without signs/symptoms of physical distress    Intensity THRR unchanged      Progression   Progression Continue to progress workloads to maintain intensity without signs/symptoms of physical distress.     Average METs 1.6      Resistance Training   Training Prescription Yes    Weight 3 lbs    Reps 10-15    Time 10 Minutes      Interval Training   Interval Training No      NuStep   Level 2    SPM 71    Minutes 30    METs 1.6      Home Exercise Plan   Plans to continue exercise at Home (comment)   Walking   Frequency Add 2 additional days to program exercise sessions.    Initial Home Exercises Provided 03/03/22             Nutrition:  Target Goals: Understanding of nutrition guidelines, daily intake of sodium <1552m, cholesterol <2033m calories 30% from fat and 7% or less from saturated fats, daily to have 5 or more servings of fruits and vegetables.  Biometrics:  Pre Biometrics - 02/04/22 1112       Pre Biometrics   Waist Circumference 50 inches    Hip Circumference 52 inches    Waist to Hip Ratio 0.96 %    Triceps Skinfold 30 mm    % Body Fat 50.4 %    Grip Strength 26 kg    Flexibility 10 in    Single Leg Stand 1.6 seconds              Nutrition Therapy Plan and Nutrition Goals:  Nutrition Therapy &  Goals - 03/08/22 1200       Nutrition Therapy   Diet Heart Healthy/ Carbohydrate Consistent Diet    Drug/Food Interactions Statins/Certain Fruits      Personal Nutrition Goals   Nutrition Goal Patient to identify strategies for managing cardiovascular risk by attending the weekly [employer] educaiton and nutrition courses    Personal Goal #2 Patient to limit sodium intake to <1524m per day    Personal Goal #3 Patient to identify food sources and limit daily intake of saturated fat, sodium, trans fat, and refined carbohydrates    Personal Goal #4 Patient to identify strategies for blood sugar control to aid with A1c <7%.    Comments GMeredith Modyhas started attending the [employer] education series. She reports starting to make some changes including reducing processed meats such as bacon and sausage, reducing refined carbohydrates, and increasing high fiber  foods. She lives at home with her daughter who is not supportive of lifestyle changes. She is up 5.5# since starting with our program.      Intervention Plan   Intervention Prescribe, educate and counsel regarding individualized specific dietary modifications aiming towards targeted core components such as weight, hypertension, lipid management, diabetes, heart failure and other comorbidities.;Nutrition handout(s) given to patient.    Expected Outcomes Short Term Goal: Understand basic principles of dietary content, such as calories, fat, sodium, cholesterol and nutrients.;Long Term Goal: Adherence to prescribed nutrition plan.             Nutrition Assessments:  MEDIFICTS Score Key: ?70 Need to make dietary changes  40-70 Heart Healthy Diet ? 40 Therapeutic Level Cholesterol Diet    Picture Your Plate Scores: <<87Unhealthy dietary pattern with much room for improvement. 41-50 Dietary pattern unlikely to meet recommendations for good health and room for improvement. 51-60 More healthful dietary pattern, with some room for improvement.  >60 Healthy dietary pattern, although there may be some specific behaviors that could be improved.    Nutrition Goals Re-Evaluation:  Nutrition Goals Re-Evaluation     RWeedName 02/08/22 1125 03/08/22 1200           Goals   Current Weight 236 lb 1.8 oz (107.1 kg) 238 lb 12.1 oz (108.3 kg)      Comment A1c 7.1, GFR 44, Cr 1.34, lipids WNL No new labs at this time;  A1c 7.1, GFR 44, Cr 1.34, lipids WNL      Expected Outcome Deborah Coffey remains motivated to improve heart health, improve blood sugar control, and lose weight. Her blood sugar has improved and most recent A2c is down to 7.1. Her PCP has recommended fluid restriction of 2L daily (66oz/day). GMeredith Modyis contemplative of lifestyle changes. GMeredith Modyhas started attending the [employer] education series. She reports starting to make some changes including reducing processed meats such as bacon and sausage,  reducing refined carbohydrates, and increasing high fiber foods. She lives at home with her daughter who is not supportive of lifestyle changes. She is up 5.5# since starting with our program. Patient will continue to benefit from adhereance to the Pritikin eating plan including increased vegetable intake of 5 servings daily, reading food labels for sodium and sugar, and switching to whole grains from white/refined grains.               Nutrition Goals Re-Evaluation:  Nutrition Goals Re-Evaluation     RBoardmanName 02/08/22 1125 03/08/22 1200           Goals   Current Weight 236 lb 1.8  oz (107.1 kg) 238 lb 12.1 oz (108.3 kg)      Comment A1c 7.1, GFR 44, Cr 1.34, lipids WNL No new labs at this time;  A1c 7.1, GFR 44, Cr 1.34, lipids WNL      Expected Outcome Deborah Coffey remains motivated to improve heart health, improve blood sugar control, and lose weight. Her blood sugar has improved and most recent A2c is down to 7.1. Her PCP has recommended fluid restriction of 2L daily (66oz/day). Deborah Coffey is contemplative of lifestyle changes. Deborah Coffey has started attending the [employer] education series. She reports starting to make some changes including reducing processed meats such as bacon and sausage, reducing refined carbohydrates, and increasing high fiber foods. She lives at home with her daughter who is not supportive of lifestyle changes. She is up 5.5# since starting with our program. Patient will continue to benefit from adhereance to the Pritikin eating plan including increased vegetable intake of 5 servings daily, reading food labels for sodium and sugar, and switching to whole grains from white/refined grains.               Nutrition Goals Discharge (Final Nutrition Goals Re-Evaluation):  Nutrition Goals Re-Evaluation - 03/08/22 1200       Goals   Current Weight 238 lb 12.1 oz (108.3 kg)    Comment No new labs at this time;  A1c 7.1, GFR 44, Cr 1.34, lipids WNL    Expected Outcome Deborah Coffey is  contemplative of lifestyle changes. Deborah Coffey has started attending the [employer] education series. She reports starting to make some changes including reducing processed meats such as bacon and sausage, reducing refined carbohydrates, and increasing high fiber foods. She lives at home with her daughter who is not supportive of lifestyle changes. She is up 5.5# since starting with our program. Patient will continue to benefit from adhereance to the Pritikin eating plan including increased vegetable intake of 5 servings daily, reading food labels for sodium and sugar, and switching to whole grains from white/refined grains.             Psychosocial: Target Goals: Acknowledge presence or absence of significant depression and/or stress, maximize coping skills, provide positive support system. Participant is able to verbalize types and ability to use techniques and skills needed for reducing stress and depression.  Initial Review & Psychosocial Screening:  Initial Psych Review & Screening - 02/04/22 1056       Initial Review   Current issues with History of Depression;Current Depression   Deborah Coffey says she does feel depressed at times denies being depressed currently     [place]? Yes   Deborah Coffey lives with her daughter and 3 grandchildren who she has for support   Comments Deborah Coffey denies being depressed currently. Deborah Coffey does not think that she needs couselling presently      Barriers   Psychosocial barriers to participate in program The patient should benefit from training in stress management and relaxation.      Screening Interventions   Interventions Encouraged to exercise;Provide feedback about the scores to participant    Expected Outcomes Long Term Goal: Stressors or current issues are controlled or eliminated.;Short Term goal: Identification and review with participant of any Quality of Life or Depression concerns found by scoring the questionnaire.;Long Term goal: The  participant improves quality of Life and PHQ9 Scores as seen by post scores and/or verbalization of changes             Quality of Life  Scores:  Quality of Life - 02/04/22 1136       Quality of Life   Select Quality of Life      Quality of Life Scores   Health/Function Pre 18.46 %    Socioeconomic Pre 20.17 %    Psych/Spiritual Pre 22 %    Family Pre 22.88 %    GLOBAL Pre 20.34 %            Scores of 19 and below usually indicate a poorer quality of life in these areas.  A difference of  2-3 points is a clinically meaningful difference.  A difference of 2-3 points in the total score of the Quality of Life Index has been associated with significant improvement in overall quality of life, self-image, physical symptoms, and general health in studies assessing change in quality of life.  PHQ-9: Review Flowsheet  More data exists      02/04/2022 01/20/2022 09/04/2021 06/01/2021 04/27/2021  Depression screen PHQ 2/9  Decreased Interest 1 0 0 0 0  Down, Depressed, Hopeless 0 0 0 0 0  PHQ - 2 Score 1 0 0 0 0  Altered sleeping 0 - 0 - 0  Tired, decreased energy 1 - 0 - 0  Change in appetite 1 - 0 - 0  Feeling bad or failure about yourself  0 - 0 - 0  Trouble concentrating 1 - 0 - 0  Moving slowly or fidgety/restless 0 - 0 - 0  Suicidal thoughts 0 - 0 - 0  PHQ-9 Score 4 - 0 - 0  Difficult doing work/chores Not difficult at all - Not difficult at all - -   Interpretation of Total Score  Total Score Depression Severity:  1-4 = Minimal depression, 5-9 = Mild depression, 10-14 = Moderate depression, 15-19 = Moderately severe depression, 20-27 = Severe depression   Psychosocial Evaluation and Intervention:   Psychosocial Re-Evaluation:  Psychosocial Re-Evaluation     Row Name 02/09/22 1413 02/23/22 1653 03/22/22 1708         Psychosocial Re-Evaluation   Current issues with Current Depression;History of Depression Current Depression;History of Depression Current  Depression;History of Depression     Comments Will review quality of life questionnaire in the upcoming week Reviewed quality of life on 02/10/22. Deborah Coffey has not returned to exercise since then [facility] has voiced some health concerns regarding her chronic foot pain no other concerns voiced     Expected Outcomes Deborah Coffey will have controlled or decreased depression upon completion of intensive cardiac rehab Deborah Coffey will have controlled or decreased depression upon completion of intensive cardiac rehab Deborah Coffey will have controlled or decreased depression upon completion of intensive cardiac rehab     Interventions Stress management education;Encouraged to attend Cardiac Rehabilitation for the exercise Stress management education;Encouraged to attend Cardiac Rehabilitation for the exercise Stress management education;Encouraged to attend Cardiac Rehabilitation for the exercise     Continue Psychosocial Services  Follow up required by staff Follow up required by staff Follow up required by staff              Psychosocial Discharge (Final Psychosocial Re-Evaluation):  Psychosocial Re-Evaluation - 03/22/22 1708       Psychosocial Re-Evaluation   Current issues with Current Depression;History of Depression    Comments Deborah Coffey has voiced some health concerns regarding her chronic foot pain no other concerns voiced    Expected Outcomes Deborah Coffey will have controlled or decreased depression upon completion of intensive cardiac rehab    Interventions Stress management  education;Encouraged to attend Cardiac Rehabilitation for the exercise    Continue Psychosocial Services  Follow up required by staff             Vocational Rehabilitation: Provide vocational rehab assistance to qualifying candidates.   Vocational Rehab Evaluation & Intervention:  Vocational Rehab - 02/04/22 1559       Initial Vocational Rehab Evaluation & Intervention   Assessment shows need for Vocational Rehabilitation No   Deborah Coffey is disabled and  does not need vocational rehab at this time            Education: Education Goals: Education classes will be provided on a weekly basis, covering required topics. Participant will state understanding/return demonstration of topics presented.    Education     Row Name 02/08/22 1200     Education   Cardiac Education Topics Pritikin   Environmental consultant Psychosocial   Psychosocial Workshop Recognizing and Reducing Stress   Instruction Review Code 1- Verbalizes Understanding   Class Start Time 1140   Class Stop Time 1230   Class Time Calculation (min) 50 min    [place] Name 02/10/22 1300     Education   Cardiac Education Topics Pritikin   Financial trader   Weekly Topic Fast and Healthy Breakfasts   Instruction Review Code 1- Verbalizes Understanding   Class Start Time 1138   Class Stop Time 1225   Class Time Calculation (min) 47 min    [place] Name 02/24/22 1200     Education   Cardiac Education Topics Pritikin   Financial trader   Weekly Topic Tasty Appetizers and Snacks   Instruction Review Code 1- Verbalizes Understanding   Class Start Time 1145   Class Stop Time 1230   Class Time Calculation (min) 45 min    [place] Name 03/01/22 1200     Education   Cardiac Education Topics Pritikin   Environmental consultant Psychosocial   Psychosocial Workshop Healthy Sleep for a Healthy Heart   Instruction Review Code 1- Verbalizes Understanding   Class Start Time 1155   Class Stop Time 1245   Class Time Calculation (min) 50 min    [place] Name 03/03/22 1300     Education   Cardiac Education Topics [place]   Educator Dietitian   Weekly Topic Delicious Desserts   Instruction Review Code 1- Verbalizes Understanding   Class Start Time  1145   Class Stop Time 1226   Class Time Calculation (min) 41 min    [place] Name 03/05/22 1300     Education   Cardiac Education Topics Pritikin   Scientist, research (life sciences)   Educator Dietitian   Select Nutrition   Nutrition Calorie Density   Instruction Review Code 1- Verbalizes Understanding   Class Start Time 1145   Class Stop Time 1230   Class Time Calculation (min) 45 min    [place] Name 03/08/22 [place]   Environmental consultant Exercise   Exercise Workshop Exercise Basics: Environmental education officer  Review Code 1- Verbalizes Understanding   Class Start Time 1149   Class Stop Time 1244   Class Time Calculation (min) 55 min    [place] Name 03/10/22 1300     Education   Cardiac Education Topics Pritikin   Barista - Meals in a Snap  Stuffed Butternut Squash   Instruction Review Code 1- Verbalizes Understanding   Class Start Time 1145   Class Stop Time 1230   Class Time Calculation (min) 45 min            Core Videos: Exercise    Move It!  Clinical staff conducted group or individual video education with verbal and written material and guidebook.  Patient learns the recommended Pritikin exercise program. Exercise with the goal of living a long, healthy life. Some of the health benefits of exercise include controlled diabetes, healthier blood pressure levels, improved cholesterol levels, improved heart and lung capacity, improved sleep, and better body composition. Everyone should speak with their doctor before starting or changing an exercise routine.  Biomechanical Limitations Clinical staff conducted group or individual video education with verbal and written material and guidebook.  Patient learns how biomechanical limitations can impact exercise and how we  can mitigate and possibly overcome limitations to have an impactful and balanced exercise routine.  Body Composition Clinical staff conducted group or individual video education with verbal and written material and guidebook.  Patient learns that body composition (ratio of muscle mass to fat mass) is a key component to assessing overall fitness, rather than body weight alone. Increased fat mass, especially visceral belly fat, can put [place] at increased risk for metabolic syndrome, type 2 diabetes, heart disease, and even death. It is recommended to combine diet and exercise (cardiovascular and resistance training) to improve your body composition. Seek guidance from your physician and exercise physiologist before implementing an exercise routine.  Exercise Action Plan Clinical staff conducted group or individual video education with verbal and written material and guidebook.  Patient learns the recommended strategies to achieve and enjoy long-term exercise adherence, including variety, self-motivation, self-efficacy, and positive decision making. Benefits of exercise include fitness, good health, weight management, more energy, better sleep, less stress, and overall well-being.  Medical   Heart Disease Risk Reduction Clinical staff conducted group or individual video education with verbal and written material and guidebook.  Patient learns our heart is our most vital organ as it circulates oxygen, nutrients, white blood cells, and hormones throughout the entire body, and carries waste away. Data supports a plant-based eating plan like the Pritikin Program for its effectiveness in slowing progression of and reversing heart disease. The video provides a number of recommendations to address heart disease.   Metabolic Syndrome and Belly Fat  Clinical staff conducted group or individual video education with verbal and written material and guidebook.  Patient learns what metabolic syndrome is, how it leads  to heart disease, and how one can reverse it and keep it from coming back. You have metabolic syndrome if you have 3 of the following 5 criteria: abdominal obesity, high blood pressure, high triglycerides, low HDL cholesterol, and high blood sugar.  Hypertension and Heart Disease Clinical staff conducted group or individual video education with verbal and written material and guidebook.  Patient learns that high blood pressure, or hypertension, is very common in the [place]. Hypertension is largely due to excessive salt intake, but  other important risk factors include being overweight, physical inactivity, drinking too much alcohol, smoking, and not eating enough potassium from fruits and vegetables. High blood pressure is a leading risk factor for heart attack, stroke, congestive heart failure, dementia, kidney failure, and premature death. Long-term effects of excessive salt intake include stiffening of the arteries and thickening of heart muscle and organ damage. Recommendations include ways to reduce hypertension and the risk of heart disease.  Diseases of Our Time - Focusing on Diabetes Clinical staff conducted group or individual video education with verbal and written material and guidebook.  Patient learns why the best way to stop diseases of our time is prevention, through food and other lifestyle changes. Medicine (such as prescription pills and surgeries) is often only a Band-Aid on the problem, not a long-term solution. Most common diseases of our time include obesity, type 2 diabetes, hypertension, heart disease, and cancer. The Pritikin Program is recommended and has been proven to help reduce, reverse, and/or prevent the damaging effects of metabolic syndrome.  Nutrition   Overview of the Pritikin Eating Plan  Clinical staff conducted group or individual video education with verbal and written material and guidebook.  Patient learns about the [place] for disease risk  reduction. The [place] emphasizes a wide variety of unrefined, minimally-processed carbohydrates, like fruits, vegetables, whole grains, and legumes. Go, Caution, and Stop food choices are explained. Plant-based and lean animal proteins are emphasized. Rationale provided for low sodium intake for blood pressure control, low added sugars for blood sugar stabilization, and low added fats and oils for coronary artery disease risk reduction and weight management.  Calorie Density  Clinical staff conducted group or individual video education with verbal and written material and guidebook.  Patient learns about calorie density and how it impacts the Pritikin Eating Plan. Knowing the characteristics of the food you choose will help you decide whether those foods will lead to weight gain or weight loss, and whether you want to consume more or less of them. Weight loss is usually a side effect of the Pritikin Eating Plan because of its focus on low calorie-dense foods.  Label Reading  Clinical staff conducted group or individual video education with verbal and written material and guidebook.  Patient learns about the Pritikin recommended label reading guidelines and corresponding recommendations regarding calorie density, added sugars, sodium content, and whole grains.  Dining Out - Part 1  Clinical staff conducted group or individual video education with verbal and written material and guidebook.  Patient learns that restaurant meals can be sabotaging because they can be so high in calories, fat, sodium, and/or sugar. Patient learns recommended strategies on how to positively address this and avoid unhealthy pitfalls.  Facts on Fats  Clinical staff conducted group or individual video education with verbal and written material and guidebook.  Patient learns that lifestyle modifications can be just as effective, if not more so, as many medications for lowering your risk of heart disease. A  Pritikin lifestyle can help to reduce your risk of inflammation and atherosclerosis (cholesterol build-up, or plaque, in the artery walls). Lifestyle interventions such as dietary choices and physical activity address the cause of atherosclerosis. A review of the types of fats and their impact on blood cholesterol levels, along with dietary recommendations to reduce fat intake is also included.  Nutrition Action Plan  Clinical staff conducted group or individual video education with verbal and written material and guidebook.  Patient learns how to incorporate Pritikin  recommendations into their lifestyle. Recommendations include planning and keeping personal health goals in mind as an important part of their success.  Healthy Mind-Set    Healthy Minds, Bodies, Hearts  Clinical staff conducted group or individual video education with verbal and written material and guidebook.  Patient learns how to identify when they are stressed. Video will discuss the impact of that stress, as well as the many benefits of stress management. Patient will also be introduced to stress management techniques. The way we think, act, and feel has an impact on our hearts.  How Our Thoughts Can Heal Our Hearts  Clinical staff conducted group or individual video education with verbal and written material and guidebook.  Patient learns that negative thoughts can cause depression and anxiety. This can result in negative lifestyle behavior and serious health problems. Cognitive behavioral therapy is an effective method to help control our thoughts in order to change and improve our emotional outlook.  Additional Videos:  Exercise    Improving Performance  Clinical staff conducted group or individual video education with verbal and written material and guidebook.  Patient learns to use a non-linear approach by alternating intensity levels and lengths of time spent exercising to help burn more calories and lose more body fat.  Cardiovascular exercise helps improve heart health, metabolism, hormonal balance, blood sugar control, and recovery from fatigue. Resistance training improves strength, endurance, balance, coordination, reaction time, metabolism, and muscle mass. Flexibility exercise improves circulation, posture, and balance. Seek guidance from your physician and exercise physiologist before implementing an exercise routine and learn your capabilities and proper form for all exercise.  Introduction to Yoga  Clinical staff conducted group or individual video education with verbal and written material and guidebook.  Patient learns about yoga, a discipline of the coming together of mind, breath, and body. The benefits of yoga include improved flexibility, improved range of motion, better posture and core strength, increased lung function, weight loss, and positive self-image. Yoga's heart health benefits include lowered blood pressure, healthier heart rate, decreased cholesterol and triglyceride levels, improved immune function, and reduced stress. Seek guidance from your physician and exercise physiologist before implementing an exercise routine and learn your capabilities and proper form for all exercise.  Medical   Aging: Enhancing Your Quality of Life  Clinical staff conducted group or individual video education with verbal and written material and guidebook.  Patient learns key strategies and recommendations to stay in good physical health and enhance quality of life, such as prevention strategies, having an advocate, securing a [place], and keeping a list of medications and system for tracking them. It also discusses how to avoid risk for bone loss.  Biology of Weight Control  Clinical staff conducted group or individual video education with verbal and written material and guidebook.  Patient learns that weight gain occurs because we consume more calories than we burn (eating more,  moving less). Even if your body weight is normal, you may have higher ratios of fat compared to muscle mass. Too much body fat puts you at increased risk for cardiovascular disease, heart attack, stroke, type 2 diabetes, and obesity-related cancers. In addition to exercise, following the [place] can help reduce your risk.  Decoding Lab Results  Clinical staff conducted group or individual video education with verbal and written material and guidebook.  Patient learns that lab test reflects one measurement whose values change over time and are influenced by many factors, including medication, stress,  sleep, exercise, food, hydration, pre-existing medical conditions, and more. It is recommended to use the knowledge from this video to become more involved with your lab results and evaluate your numbers to speak with your doctor.   Diseases of Our Time - Overview  Clinical staff conducted group or individual video education with verbal and written material and guidebook.  Patient learns that according to the CDC, 50% to 70% of chronic diseases (such as obesity, type 2 diabetes, elevated lipids, hypertension, and heart disease) are avoidable through lifestyle improvements including healthier food choices, listening to satiety cues, and increased physical activity.  Sleep Disorders Clinical staff conducted group or individual video education with verbal and written material and guidebook.  Patient learns how good quality and duration of sleep are important to overall health and well-being. Patient also learns about sleep disorders and how they impact health along with recommendations to address them, including discussing with a physician.  Nutrition  Dining Out - Part 2 Clinical staff conducted group or individual video education with verbal and written material and guidebook.  Patient learns how to plan ahead and communicate in order to maximize their dining experience in a healthy and  nutritious manner. Included are recommended food choices based on the type of restaurant the patient is visiting.   Fueling a Best boy conducted group or individual video education with verbal and written material and guidebook.  There is a strong connection between our food choices and our health. Diseases like obesity and type 2 diabetes are very prevalent and are in large-part due to lifestyle choices. The Pritikin Eating Plan provides plenty of food and hunger-curbing satisfaction. It is easy to follow, affordable, and helps reduce health risks.  Menu Workshop  Clinical staff conducted group or individual video education with verbal and written material and guidebook.  Patient learns that restaurant meals can sabotage health goals because they are often packed with calories, fat, sodium, and sugar. Recommendations include strategies to plan ahead and to communicate with the manager, chef, or server to help order a healthier meal.  Planning Your Eating Strategy  Clinical staff conducted group or individual video education with verbal and written material and guidebook.  Patient learns about the [place] and its benefit of reducing the risk of disease. The [place] does not focus on calories. Instead, it emphasizes high-quality, nutrient-rich foods. By knowing the characteristics of the foods, we choose, we can determine their calorie density and make informed decisions.  Targeting Your Nutrition Priorities  Clinical staff conducted group or individual video education with verbal and written material and guidebook.  Patient learns that lifestyle habits have a tremendous impact on disease risk and progression. This video provides eating and physical activity recommendations based on your personal health goals, such as reducing LDL cholesterol, losing weight, preventing or controlling type 2 diabetes, and reducing high blood pressure.  Vitamins and  Minerals  Clinical staff conducted group or individual video education with verbal and written material and guidebook.  Patient learns different ways to obtain key vitamins and minerals, including through a recommended healthy diet. It is important to discuss all supplements you take with your doctor.   Healthy Mind-Set    Smoking Cessation  Clinical staff conducted group or individual video education with verbal and written material and guidebook.  Patient learns that cigarette smoking and tobacco addiction pose a serious health risk which affects millions of people. Stopping smoking will significantly reduce the risk of heart  disease, lung disease, and many forms of cancer. Recommended strategies for quitting are covered, including working with your doctor to develop a successful plan.  Culinary   Becoming a Financial trader conducted group or individual video education with verbal and written material and guidebook.  Patient learns that cooking at home can be healthy, cost-effective, quick, and puts them in control. Keys to cooking healthy recipes will include looking at your recipe, assessing your equipment needs, planning ahead, making it simple, choosing cost-effective seasonal ingredients, and limiting the use of added fats, salts, and sugars.  Cooking - Breakfast and Snacks  Clinical staff conducted group or individual video education with verbal and written material and guidebook.  Patient learns how important breakfast is to satiety and nutrition through the entire day. Recommendations include key foods to eat during breakfast to help stabilize blood sugar levels and to prevent overeating at meals later in the day. Planning ahead is also a key component.  Cooking - Human resources officer conducted group or individual video education with verbal and written material and guidebook.  Patient learns eating strategies to improve overall health, including an approach  to cook more at home. Recommendations include thinking of animal protein as a side on your plate rather than center stage and focusing instead on lower calorie dense options like vegetables, fruits, whole grains, and plant-based proteins, such as beans. Making sauces in large quantities to freeze for later and leaving the skin on your vegetables are also recommended to maximize your experience.  Cooking - Healthy Salads and Dressing Clinical staff conducted group or individual video education with verbal and written material and guidebook.  Patient learns that vegetables, fruits, whole grains, and legumes are the foundations of the [place]. Recommendations include how to incorporate each of these in flavorful and healthy salads, and how to create homemade salad dressings. Proper handling of ingredients is also covered. Cooking - Soups and [employer] - Soups and Desserts Clinical staff conducted group or individual video education with verbal and written material and guidebook.  Patient learns that Pritikin soups and desserts make for easy, nutritious, and delicious snacks and meal components that are low in sodium, fat, sugar, and calorie density, while high in vitamins, minerals, and filling fiber. Recommendations include simple and healthy ideas for soups and desserts.   Overview     The Pritikin Solution Program Overview Clinical staff conducted group or individual video education with verbal and written material and guidebook.  Patient learns that the results of the [place] Program have been documented in more than 100 articles published in peer-reviewed journals, and the benefits include reducing risk factors for (and, in some cases, even reversing) high cholesterol, high blood pressure, type 2 diabetes, obesity, and more! An overview of the three key pillars of the Pritikin Program will be covered: eating well, doing regular exercise, and having a healthy  mind-set.  WORKSHOPS  Exercise: Exercise Basics: Building Your Action Plan Clinical staff led group instruction and group discussion with PowerPoint presentation and patient guidebook. To enhance the learning environment the use of posters, models and videos may be added. At the conclusion of this workshop, patients will comprehend the difference between physical activity and exercise, as well as the benefits of incorporating both, into their routine. Patients will understand the FITT (Frequency, Intensity, Time, and Type) principle and how to use it to build an exercise action plan. In addition, safety concerns and other considerations for exercise  and cardiac rehab will be addressed by the presenter. The purpose of this lesson is to promote a comprehensive and effective weekly exercise routine in order to improve patients' overall level of fitness.   Managing Heart Disease: Your Path to a Healthier Heart Clinical staff led group instruction and group discussion with PowerPoint presentation and patient guidebook. To enhance the learning environment the use of posters, models and videos may be added.At the conclusion of this workshop, patients will understand the anatomy and physiology of the heart. Additionally, they will understand how Pritikin's three pillars impact the risk factors, the progression, and the management of heart disease.  The purpose of this lesson is to provide a high-level overview of the heart, heart disease, and how the Pritikin lifestyle positively impacts risk factors.  Exercise Biomechanics Clinical staff led group instruction and group discussion with PowerPoint presentation and patient guidebook. To enhance the learning environment the use of posters, models and videos may be added. Patients will learn how the structural parts of their bodies function and how these functions impact their daily activities, movement, and exercise. Patients will learn how to promote a  neutral spine, learn how to manage pain, and identify ways to improve their physical movement in order to promote healthy living. The purpose of this lesson is to expose patients to common physical limitations that impact physical activity. Participants will learn practical ways to adapt and manage aches and pains, and to minimize their effect on regular exercise. Patients will learn how to maintain good posture while sitting, walking, and lifting.  Balance Training and Fall Prevention  Clinical staff led group instruction and group discussion with PowerPoint presentation and patient guidebook. To enhance the learning environment the use of posters, models and videos may be added. At the conclusion of this workshop, patients will understand the importance of their sensorimotor skills (vision, proprioception, and the vestibular system) in maintaining their ability to balance as they age. Patients will apply a variety of balancing exercises that are appropriate for their current level of function. Patients will understand the common causes for poor balance, possible solutions to these problems, and ways to modify their physical environment in order to minimize their fall risk. The purpose of this lesson is to teach patients about the importance of maintaining balance as they age and ways to minimize their risk of falling.  WORKSHOPS   Nutrition:  Fueling a Scientist, research (physical sciences) led group instruction and group discussion with PowerPoint presentation and patient guidebook. To enhance the learning environment the use of posters, models and videos may be added. Patients will review the foundational principles of the [place] and understand what constitutes a serving size in each of the food groups. Patients will also learn Pritikin-friendly foods that are better choices when away from home and review make-ahead meal and snack options. Calorie density will be reviewed and applied to  three nutrition priorities: weight maintenance, weight loss, and weight gain. The purpose of this lesson is to reinforce (in a group setting) the key concepts around what patients are recommended to eat and how to apply these guidelines when away from home by planning and selecting Pritikin-friendly options. Patients will understand how calorie density may be adjusted for different weight management goals.  Mindful Eating  Clinical staff led group instruction and group discussion with PowerPoint presentation and patient guidebook. To enhance the learning environment the use of posters, models and videos may be added. Patients will briefly review the concepts of the  Pritikin Eating Plan and the importance of low-calorie dense foods. The concept of mindful eating will be introduced as well as the importance of paying attention to internal hunger signals. Triggers for non-hunger eating and techniques for dealing with triggers will be explored. The purpose of this lesson is to provide patients with the opportunity to review the basic principles of the [place], discuss the value of eating mindfully and how to measure internal cues of hunger and fullness using the Hunger Scale. Patients will also discuss reasons for non-hunger eating and learn strategies to use for controlling emotional eating.  Targeting Your Nutrition Priorities Clinical staff led group instruction and group discussion with PowerPoint presentation and patient guidebook. To enhance the learning environment the use of posters, models and videos may be added. Patients will learn how to determine their genetic susceptibility to disease by reviewing their family history. Patients will gain insight into the importance of diet as part of an overall healthy lifestyle in mitigating the impact of genetics and other environmental insults. The purpose of this lesson is to provide patients with the opportunity to assess their personal nutrition  priorities by looking at their family history, their own health history and current risk factors. Patients will also be able to discuss ways of prioritizing and modifying the [place] for their highest risk areas  Menu  Clinical staff led group instruction and group discussion with PowerPoint presentation and patient guidebook. To enhance the learning environment the use of posters, models and videos may be added. Using menus brought in from [employer], or printed from [employer], patients will apply the [place] dining out guidelines that were presented in the [employer] video. Patients will also be able to practice these guidelines in a variety of provided scenarios. The purpose of this lesson is to provide patients with the opportunity to practice hands-on learning of the [place] with actual menus and practice scenarios.  Label Reading Clinical staff led group instruction and group discussion with PowerPoint presentation and patient guidebook. To enhance the learning environment the use of posters, models and videos may be added. Patients will review and discuss the Pritikin label reading guidelines presented in Pritikin's Label Reading Educational series video. Using fool labels brought in from local grocery stores and markets, patients will apply the label reading guidelines and determine if the packaged food meet the Pritikin guidelines. The purpose of this lesson is to provide patients with the opportunity to review, discuss, and practice hands-on learning of the Pritikin Label Reading guidelines with actual packaged food labels. [place] Workshops are designed to teach patients ways to prepare quick, simple, and affordable recipes at home. The importance of nutrition's role in chronic disease risk reduction is reflected in its emphasis in the overall Pritikin program. By learning how to prepare essential  core Pritikin Eating Plan recipes, patients will increase control over what they eat; be able to customize the flavor of foods without the use of added salt, sugar, or fat; and improve the quality of the food they consume. By learning a set of core recipes which are easily assembled, quickly prepared, and affordable, patients are more likely to prepare more healthy foods at home. These workshops focus on convenient breakfasts, simple entres, side dishes, and desserts which can be prepared with minimal effort and are consistent with nutrition recommendations for cardiovascular risk reduction. Cooking [employer] are taught by a Engineer, materials (RD)  who has been trained by the [employer] team. The chef or RD has a clear understanding of the importance of minimizing - if not completely eliminating - added fat, sugar, and sodium in recipes. Throughout the series of [place] Workshop sessions, patients will learn about healthy ingredients and efficient methods of cooking to build confidence in their capability to prepare    Cooking School weekly topics:  Adding Flavor- Sodium-Free  Fast and Healthy Breakfasts  Powerhouse Plant-Based Proteins  Satisfying Salads and Dressings  Simple Sides and Sauces  International Cuisine-Spotlight on the [place] Zones  Delicious Desserts  Savory Soups  Efficiency Cooking - Meals in a Snap  Tasty Appetizers and Snacks  Comforting Weekend Breakfasts  One-Pot Wonders   Fast Evening Meals  Easy [place] (Psychosocial): New Thoughts, New Behaviors Clinical staff led group instruction and group discussion with PowerPoint presentation and patient guidebook. To enhance the learning environment the use of posters, models and videos may be added. Patients will learn and practice techniques for developing effective health and lifestyle goals. Patients will be able to  effectively apply the goal setting process learned to develop at least one new personal goal.  The purpose of this lesson is to expose patients to a new skill set of behavior modification techniques such as techniques setting SMART goals, overcoming barriers, and achieving new thoughts and new behaviors.  Managing Moods and Relationships Clinical staff led group instruction and group discussion with PowerPoint presentation and patient guidebook. To enhance the learning environment the use of posters, models and videos may be added. Patients will learn how emotional and chronic stress factors can impact their health and relationships. They will learn healthy ways to manage their moods and utilize positive coping mechanisms. In addition, ICR patients will learn ways to improve communication skills. The purpose of this lesson is to expose patients to ways of understanding how one's mood and health are intimately connected. Developing a healthy outlook can help build positive relationships and connections with others. Patients will understand the importance of utilizing effective communication skills that include actively listening and being heard. They will learn and understand the importance of the "4 Cs" and especially Connections in fostering of a Healthy Mind-Set.  Healthy Sleep for a Healthy Heart Clinical staff led group instruction and group discussion with PowerPoint presentation and patient guidebook. To enhance the learning environment the use of posters, models and videos may be added. At the conclusion of this workshop, patients will be able to demonstrate knowledge of the importance of sleep to overall health, well-being, and quality of life. They will understand the symptoms of, and treatments for, common sleep disorders. Patients will also be able to identify daytime and nighttime behaviors which impact sleep, and they will be able to apply these tools to help manage sleep-related challenges. The  purpose of this lesson is to provide patients with a general overview of sleep and outline the importance of quality sleep. Patients will learn about a few of the most common sleep disorders. Patients will also be introduced to the concept of "sleep hygiene," and discover ways to self-manage certain sleeping problems through simple daily behavior changes. Finally, the workshop will motivate patients by clarifying the links between quality sleep and their goals of heart-healthy living.   Recognizing and Reducing Stress Clinical staff led group instruction and group discussion with PowerPoint presentation and patient guidebook. To enhance the learning environment the use of  posters, models and videos may be added. At the conclusion of this workshop, patients will be able to understand the types of stress reactions, differentiate between acute and chronic stress, and recognize the impact that chronic stress has on their health. They will also be able to apply different coping mechanisms, such as reframing negative self-talk. Patients will have the opportunity to practice a variety of stress management techniques, such as deep abdominal breathing, progressive muscle relaxation, and/or guided imagery.  The purpose of this lesson is to educate patients on the role of stress in their lives and to provide healthy techniques for coping with it.  Learning Barriers/Preferences:  Learning Barriers/Preferences - 02/04/22 1136       Learning Barriers/Preferences   Learning Barriers Sight;Exercise Concerns   Pt wears glasses, balance concerns, previous falls   Learning Preferences Audio;Group Instruction;Individual Instruction;Pictoral;Skilled Demonstration;Verbal Instruction             Education Topics:  Knowledge Questionnaire Score:  Knowledge Questionnaire Score - 02/04/22 1137       Knowledge Questionnaire Score   Pre Score 19/28             Core Components/Risk Factors/Patient Goals at  Admission:  Personal Goals and Risk Factors at Admission - 02/04/22 1140       Core Components/Risk Factors/Patient Goals on Admission    Weight Management Yes;Obesity;Weight Loss    Intervention Weight Management: Develop a combined nutrition and exercise program designed to reach desired caloric intake, while maintaining appropriate intake of nutrient and fiber, sodium and fats, and appropriate energy expenditure required for the weight goal.;Weight Management: Provide education and appropriate resources to help participant work on and attain dietary goals.;Weight Management/Obesity: Establish reasonable short term and long term weight goals.;Obesity: Provide education and appropriate resources to help participant work on and attain dietary goals.    Admit Weight 233 lb 4 oz (105.8 kg)    Expected Outcomes Short Term: Continue to assess and modify interventions until short term weight is achieved;Long Term: Adherence to nutrition and physical activity/exercise program aimed toward attainment of established weight goal;Weight Loss: Understanding of general recommendations for a balanced deficit meal plan, which promotes 1-2 lb weight loss per week and includes a negative energy balance of [phone] kcal/d;Understanding recommendations for meals to include 15-35% energy as protein, 25-35% energy from fat, 35-60% energy from carbohydrates, less than 237m of dietary cholesterol, 20-35 gm of total fiber daily;Understanding of distribution of calorie intake throughout the day with the consumption of 4-5 meals/snacks    Diabetes Yes    Intervention Provide education about signs/symptoms and action to take for hypo/hyperglycemia.;Provide education about proper nutrition, including hydration, and aerobic/resistive exercise prescription along with prescribed medications to achieve blood glucose in normal ranges: Fasting glucose 65-99 mg/dL    Expected Outcomes Short Term: Participant verbalizes understanding of  the signs/symptoms and immediate care of hyper/hypoglycemia, proper foot care and importance of medication, aerobic/resistive exercise and nutrition plan for blood glucose control.;Long Term: Attainment of HbA1C < 7%.    Heart Failure Yes    Intervention Provide a combined exercise and nutrition program that is supplemented with education, support and counseling about heart failure. Directed toward relieving symptoms such as shortness of breath, decreased exercise tolerance, and extremity edema.    Expected Outcomes Improve functional capacity of life;Short term: Attendance in program 2-3 days a week with increased exercise capacity. Reported lower sodium intake. Reported increased fruit and vegetable intake. Reports medication compliance.;Short term: Daily weights obtained and reported for  increase. Utilizing diuretic protocols set by physician.;Long term: Adoption of self-care skills and reduction of barriers for early signs and symptoms recognition and intervention leading to self-care maintenance.    Hypertension Yes    Intervention Provide education on lifestyle modifcations including regular physical activity/exercise, weight management, moderate sodium restriction and increased consumption of fresh fruit, vegetables, and low fat dairy, alcohol moderation, and smoking cessation.;Monitor prescription use compliance.    Expected Outcomes Short Term: Continued assessment and intervention until BP is < 140/24m HG in hypertensive participants. < 130/846mHG in hypertensive participants with diabetes, heart failure or chronic kidney disease.;Long Term: Maintenance of blood pressure at goal levels.    Lipids Yes    Intervention Provide education and support for participant on nutrition & aerobic/resistive exercise along with prescribed medications to achieve LDL <7043mHDL >41m61m  Expected Outcomes Short Term: Participant states understanding of desired cholesterol values and is compliant with medications  prescribed. Participant is following exercise prescription and nutrition guidelines.;Long Term: Cholesterol controlled with medications as prescribed, with individualized exercise RX and with personalized nutrition plan. Value goals: LDL < 70mg30mL > 40 mg.    Stress Yes    Intervention Offer individual and/or small group education and counseling on adjustment to heart disease, stress management and health-related lifestyle change. Teach and support self-help strategies.;Refer participants experiencing significant psychosocial distress to appropriate mental health specialists for further evaluation and treatment. When possible, include family members and significant others in education/counseling sessions.    Expected Outcomes Short Term: Participant demonstrates changes in health-related behavior, relaxation and other stress management skills, ability to obtain effective social support, and compliance with psychotropic medications if prescribed.;Long Term: Emotional wellbeing is indicated by absence of clinically significant psychosocial distress or social isolation.    Personal Goal Other Yes    Personal Goal Short term: get back to Ex, control SOB Long term: lose wt, better heart health    Intervention Will continue to monitor pt and progress workloads as tolerated without sign or symptom    Expected Outcomes Pt will achieve her goals             Core Components/Risk Factors/Patient Goals Review:   Goals and Risk Factor Review     Row Name 02/09/22 1416 02/23/22 1654 03/03/22 1100 03/22/22 1709       Core Components/Risk Factors/Patient Goals Review   Personal Goals Review Heart Failure;Weight Management/Obesity;Stress;Hypertension;Lipids;Diabetes Heart Failure;Weight Management/Obesity;Stress;Hypertension;Lipids;Diabetes Heart Failure;Weight Management/Obesity;Stress;Hypertension;Lipids;Diabetes;Tobacco Cessation Heart Failure;Weight  Management/Obesity;Stress;Hypertension;Lipids;Diabetes;Tobacco Cessation    Review Deborah Coffey started intensive cardiac rehab on 02/08/22. Deborah Coffey did well with exercise for her fitness level. Vital signs and CBG's were stable. Deborah Coffey started intensive cardiac rehab on 02/08/22. Deborah Coffey did well with exercise for her fitness level. Vital signs and CBG's were stable. Deborah Coffey's last exercise session was on 02/10/22. Deborah Coffey stated that she quit cigarettes 6 months ago and cigars 13 days ago. Deborah Coffey shared that she [id number]. Deborah Coffey states that when she gets the urge to smoke she puts on a nicotine patch. I congratulated her on her success. Deborah Coffey reported that she has started smoking 2 cigarettes a day. Vital sign and reported home CBG were stable today. Deborah Coffey reported having chornic left foot pain. Encouraged patient to follow up with her primary care provdier regarding this.    Expected Outcomes Deborah Coffey will continue to participate in intensive cardiac rehab for exercise, nutrition and lifestyle modifications Deborah Coffey will continue to participate in intensive cardiac rehab for exercise, nutrition and lifestyle  modifications Deborah Coffey will maintain abstinence from tobacco products. Deborah Coffey will continue to participate in intesive caridac rehab for exercise, nutrition and lifestyle modifications. Will continue to encourage smoking cessation             Core Components/Risk Factors/Patient Goals at Discharge (Final Review):   Goals and Risk Factor Review - 03/22/22 1709       Core Components/Risk Factors/Patient Goals Review   Personal Goals Review Heart Failure;Weight Management/Obesity;Stress;Hypertension;Lipids;Diabetes;Tobacco Cessation    Review Deborah Coffey reported that she has started smoking 2 cigarettes a day. Vital sign and reported home CBG were stable today. Deborah Coffey reported having chornic left foot pain. Encouraged patient to follow up with her primary care provdier regarding this.    Expected Outcomes  Deborah Coffey will continue to participate in intesive caridac rehab for exercise, nutrition and lifestyle modifications. Will continue to encourage smoking cessation             ITP Comments:  ITP Comments     Row Name 02/04/22 1053 02/09/22 1409 02/23/22 1652 03/22/22 1706     ITP Comments Dr Fransico Him MD, Medical Director. Introduction to Pritikin Education Program/ Intensive Cardiac Rehab. Initial orientation packet reviewed with the patient 30 Day ITP Review. Deborah Coffey started intensive cardiac rehab on 02/08/22 and did well with exercise. 30 Day ITP Review. Deborah Coffey started intensive cardiac rehab on 02/08/22 and did well with exercise. Deborah Coffey's last day of participaiton was on 02/10/22 30 Day ITP Review, Deborah Coffey returned to exercise today and plans to complete the program on 04/02/22             Comments: See ITP Comments

## 2022-03-22 NOTE — Telephone Encounter (Signed)
3rd call to patient at  [phone] as best contact in note to review sx right foot swelling. No answer, LVMTCB [phone].

## 2022-03-24 ENCOUNTER — Encounter (HOSPITAL_COMMUNITY)
Admission: RE | Admit: 2022-03-24 | Discharge: 2022-03-24 | Disposition: A | Discharge status: Home / Self Care | Payer: No Typology Code available for payment source | Source: Ambulatory Visit | Attending: Internal Medicine | Admitting: Internal Medicine

## 2022-03-24 DIAGNOSIS — I5032 Chronic diastolic (congestive) heart failure: Secondary | ICD-10-CM

## 2022-03-24 NOTE — Progress Notes (Signed)
Incomplete Session Note  Patient Details  Name: Deborah Coffey MRN: [id number] Date of Birth: 02-07-1956 Referring Provider:   Flowsheet Row INTENSIVE CARDIAC REHAB ORIENT from 02/04/2022 in [facility]  Referring Provider Dr. Glori Bickers, MD       Deborah Coffey did not complete her rehab session.  Deborah Coffey reported this morning that her left foot continues to bother her. [name] says she went to [facility] and left because there were too many people waiting to be seen. I advised Deborah Coffey not to exercise today as her foot is still bothering her patient is limping. Deborah Coffey said she will go to the [facility] urgent care at [facility] to be evaluated.Harrell Gave RN BSN

## 2022-03-26 ENCOUNTER — Encounter (HOSPITAL_COMMUNITY): Payer: No Typology Code available for payment source

## 2022-03-29 ENCOUNTER — Encounter (HOSPITAL_COMMUNITY): Payer: No Typology Code available for payment source

## 2022-03-31 ENCOUNTER — Telehealth (HOSPITAL_COMMUNITY): Payer: Self-pay | Admitting: *Deleted

## 2022-03-31 ENCOUNTER — Encounter (HOSPITAL_COMMUNITY): Payer: No Typology Code available for payment source

## 2022-03-31 NOTE — Telephone Encounter (Signed)
Left message to call cardiac rehab.Barnet Pall, RN,BSN 03/31/2022 9:47 AM

## 2022-04-01 ENCOUNTER — Telehealth (HOSPITAL_COMMUNITY): Payer: Self-pay | Admitting: *Deleted

## 2022-04-01 NOTE — Telephone Encounter (Signed)
Spoke with [name] she will not be able to attend tomorrow. Will cancel appointment for tomorrow. Gwen says that her foot is feeling better. Harrell Gave RN BSN

## 2022-04-02 ENCOUNTER — Encounter (HOSPITAL_COMMUNITY): Payer: No Typology Code available for payment source

## 2022-04-05 ENCOUNTER — Encounter (HOSPITAL_COMMUNITY): Payer: No Typology Code available for payment source

## 2022-04-07 ENCOUNTER — Encounter (HOSPITAL_COMMUNITY)
Admission: RE | Admit: 2022-04-07 | Discharge: 2022-04-07 | Disposition: A | Discharge status: Home / Self Care | Payer: No Typology Code available for payment source | Source: Ambulatory Visit | Attending: Internal Medicine | Admitting: Internal Medicine

## 2022-04-07 DIAGNOSIS — Z951 Presence of aortocoronary bypass graft: Secondary | ICD-10-CM | POA: Diagnosis not present

## 2022-04-07 DIAGNOSIS — I5032 Chronic diastolic (congestive) heart failure: Secondary | ICD-10-CM

## 2022-04-09 ENCOUNTER — Encounter (HOSPITAL_COMMUNITY)
Admission: RE | Admit: 2022-04-09 | Discharge: 2022-04-09 | Disposition: A | Discharge status: Home / Self Care | Payer: No Typology Code available for payment source | Source: Ambulatory Visit | Attending: Internal Medicine | Admitting: Internal Medicine

## 2022-04-09 VITALS — BP 110/60 | HR 81 | Wt 238.1 lb

## 2022-04-09 DIAGNOSIS — I5032 Chronic diastolic (congestive) heart failure: Secondary | ICD-10-CM

## 2022-04-09 DIAGNOSIS — I272 Pulmonary hypertension, unspecified: Secondary | ICD-10-CM

## 2022-04-09 DIAGNOSIS — Z951 Presence of aortocoronary bypass graft: Secondary | ICD-10-CM | POA: Diagnosis not present

## 2022-04-14 ENCOUNTER — Ambulatory Visit (HOSPITAL_COMMUNITY): Payer: No Typology Code available for payment source

## 2022-04-16 ENCOUNTER — Ambulatory Visit (HOSPITAL_COMMUNITY): Payer: No Typology Code available for payment source

## 2022-04-16 NOTE — Progress Notes (Signed)
Discharge Progress Report  Patient Details  Name: Deborah Coffey MRN: [id number] Date of Birth: 1955/09/04 Referring Provider:   Flowsheet Row INTENSIVE CARDIAC REHAB ORIENT from 02/04/2022 in [facility]  Referring Provider Dr. Glori Bickers, MD        Number of Visits: 12 exercise and 10 education sessions  Reason for Discharge:  Patient reached a stable level of exercise.  Smoking History:  Social History   Tobacco Use  Smoking Status Some Days   Packs/day: 0.25   Years: 42.00   Total pack years: 10.50   Types: Cigarettes  Smokeless Tobacco Never  Tobacco Comments   Smokes 1-3 cigars/ black cigarettes a week. Agreed to referral to [facility] quit line. Referral faxed. Patient given cessation information    Diagnosis:  Heart failure, diastolic, chronic ([place])  Pulmonary hypertension (HCC)  ADL UCSD:   Initial Exercise Prescription:  Initial Exercise Prescription - 02/04/22 1100       Date of Initial Exercise RX and Referring Provider   Date 02/04/22    Referring Provider Dr. Glori Bickers, MD    Expected Discharge Date 04/02/22      NuStep   Level 1    SPM 60    Minutes 20    METs 1.6      Prescription Details   Frequency (times per week) 3    Duration Progress to 30 minutes of continuous aerobic without signs/symptoms of physical distress      Intensity   THRR 40-80% of Max Heartrate 62-125    Ratings of Perceived Exertion 11-13    Perceived Dyspnea 0-4      Progression   Progression Continue progressive overload as per policy without signs/symptoms or physical distress.      Resistance Training   Training Prescription Yes    Weight 2 lbs    Reps 10-15             Discharge Exercise Prescription (Final Exercise Prescription Changes):  Exercise Prescription Changes - 04/09/22 1026       Response to Exercise   Blood Pressure (Admit) 110/60    Blood Pressure (Exercise)  112/60    Blood Pressure (Exit) 102/64    Heart Rate (Admit) 81 bpm    Heart Rate (Exercise) 87 bpm    Heart Rate (Exit) 75 bpm    Rating of Perceived Exertion (Exercise) 13    Symptoms None    Duration Progress to 30 minutes of  aerobic without signs/symptoms of physical distress    Intensity THRR unchanged      Progression   Progression Continue to progress workloads to maintain intensity without signs/symptoms of physical distress.    Average METs 1.5      Resistance Training   Training Prescription Yes    Weight 3 lbs    Reps 10-15    Time 10 Minutes      Interval Training   Interval Training No      NuStep   Level 2    SPM 64    Minutes 30    METs 1.5      Home Exercise Plan   Plans to continue exercise at Home (comment)   Walking   Frequency Add 2 additional days to program exercise sessions.    Initial Home Exercises Provided 03/03/22             Functional Capacity:  6 Minute Walk     Row Name 02/04/22 1113  6 Minute Walk   Phase Initial  Pt uses cane and did not bring it, Nustep test due to balance issues and previous falls     Distance 1017 feet  Nustep test     Walk Time 6 minutes     # of Rest Breaks 1  4.55-5.21 rest break due to fatigue and SOB     MPH 1.93     METS 1.79     RPE 11     Perceived Dyspnea  1     VO2 Peak 6.27     Symptoms Yes (comment)     Comments Rest break 4.55-5.21 due to fatigue and SOB (Dys 1) Nustep test info: AVG SPM: 65, AVG WATTS: 12, Total steps: 356, Dist Km: 0.31, AVG MET's: 1.4     Resting HR 91 bpm     Resting BP 122/68     Resting Oxygen Saturation  98 %     Exercise Oxygen Saturation  during 6 min walk 97 %     Max Ex. HR 95 bpm     Max Ex. BP 107/70     2 Minute Post BP 118/74              Psychological, QOL, Others - Outcomes: PHQ 2/9:    02/04/2022    9:58 AM 01/20/2022    2:07 PM 09/04/2021    1:00 PM 06/01/2021    3:07 PM 04/27/2021    1:43 PM  Depression screen PHQ 2/9  Decreased  Interest 1 0 0 0 0  Down, Depressed, Hopeless 0 0 0 0 0  PHQ - 2 Score 1 0 0 0 0  Altered sleeping 0  0  0  Tired, decreased energy 1  0  0  Change in appetite 1  0  0  Feeling bad or failure about yourself  0  0  0  Trouble concentrating 1  0  0  Moving slowly or fidgety/restless 0  0  0  Suicidal thoughts 0  0  0  PHQ-9 Score 4  0  0  Difficult doing work/chores Not difficult at all  Not difficult at all      Quality of Life:  Quality of Life - 02/04/22 1136       Quality of Life   Select Quality of Life      Quality of Life Scores   Health/Function Pre 18.46 %    Socioeconomic Pre 20.17 %    Psych/Spiritual Pre 22 %    Family Pre 22.88 %    GLOBAL Pre 20.34 %             Personal Goals: Goals established at orientation with interventions provided to work toward goal.  Personal Goals and Risk Factors at Admission - 02/04/22 1140       Core Components/Risk Factors/Patient Goals on Admission    Weight Management Yes;Obesity;Weight Loss    Intervention Weight Management: Develop a combined nutrition and exercise program designed to reach desired caloric intake, while maintaining appropriate intake of nutrient and fiber, sodium and fats, and appropriate energy expenditure required for the weight goal.;Weight Management: Provide education and appropriate resources to help participant work on and attain dietary goals.;Weight Management/Obesity: Establish reasonable short term and long term weight goals.;Obesity: Provide education and appropriate resources to help participant work on and attain dietary goals.    Admit Weight 233 lb 4 oz (105.8 kg)    Expected Outcomes Short Term: Continue to assess and modify  interventions until short term weight is achieved;Long Term: Adherence to nutrition and physical activity/exercise program aimed toward attainment of established weight goal;Weight Loss: Understanding of general recommendations for a balanced deficit meal plan, which  promotes 1-2 lb weight loss per week and includes a negative energy balance of [id number] kcal/d;Understanding recommendations for meals to include 15-35% energy as protein, 25-35% energy from fat, 35-60% energy from carbohydrates, less than 275m of dietary cholesterol, 20-35 gm of total fiber daily;Understanding of distribution of calorie intake throughout the day with the consumption of 4-5 meals/snacks    Diabetes Yes    Intervention Provide education about signs/symptoms and action to take for hypo/hyperglycemia.;Provide education about proper nutrition, including hydration, and aerobic/resistive exercise prescription along with prescribed medications to achieve blood glucose in normal ranges: Fasting glucose 65-99 mg/dL    Expected Outcomes Short Term: Participant verbalizes understanding of the signs/symptoms and immediate care of hyper/hypoglycemia, proper foot care and importance of medication, aerobic/resistive exercise and nutrition plan for blood glucose control.;Long Term: Attainment of HbA1C < 7%.    Heart Failure Yes    Intervention Provide a combined exercise and nutrition program that is supplemented with education, support and counseling about heart failure. Directed toward relieving symptoms such as shortness of breath, decreased exercise tolerance, and extremity edema.    Expected Outcomes Improve functional capacity of life;Short term: Attendance in program 2-3 days a week with increased exercise capacity. Reported lower sodium intake. Reported increased fruit and vegetable intake. Reports medication compliance.;Short term: Daily weights obtained and reported for increase. Utilizing diuretic protocols set by physician.;Long term: Adoption of self-care skills and reduction of barriers for early signs and symptoms recognition and intervention leading to self-care maintenance.    Hypertension Yes    Intervention Provide education on lifestyle modifcations including regular physical  activity/exercise, weight management, moderate sodium restriction and increased consumption of fresh fruit, vegetables, and low fat dairy, alcohol moderation, and smoking cessation.;Monitor prescription use compliance.    Expected Outcomes Short Term: Continued assessment and intervention until BP is < 140/914mHG in hypertensive participants. < 130/8081mG in hypertensive participants with diabetes, heart failure or chronic kidney disease.;Long Term: Maintenance of blood pressure at goal levels.    Lipids Yes    Intervention Provide education and support for participant on nutrition & aerobic/resistive exercise along with prescribed medications to achieve LDL <72m68mDL >40mg59m Expected Outcomes Short Term: Participant states understanding of desired cholesterol values and is compliant with medications prescribed. Participant is following exercise prescription and nutrition guidelines.;Long Term: Cholesterol controlled with medications as prescribed, with individualized exercise RX and with personalized nutrition plan. Value goals: LDL < 72mg,58m > 40 mg.    Stress Yes    Intervention Offer individual and/or small group education and counseling on adjustment to heart disease, stress management and health-related lifestyle change. Teach and support self-help strategies.;Refer participants experiencing significant psychosocial distress to appropriate mental health specialists for further evaluation and treatment. When possible, include family members and significant others in education/counseling sessions.    Expected Outcomes Short Term: Participant demonstrates changes in health-related behavior, relaxation and other stress management skills, ability to obtain effective social support, and compliance with psychotropic medications if prescribed.;Long Term: Emotional wellbeing is indicated by absence of clinically significant psychosocial distress or social isolation.    Personal Goal Other Yes     Personal Goal Short term: get back to Ex, control SOB Long term: lose wt, better heart health    Intervention Will continue to  monitor pt and progress workloads as tolerated without sign or symptom    Expected Outcomes Pt will achieve her goals              Personal Goals Discharge:  Goals and Risk Factor Review     Row Name 02/09/22 1416 02/23/22 1654 03/03/22 1100 03/22/22 1709       Core Components/Risk Factors/Patient Goals Review   Personal Goals Review Heart Failure;Weight Management/Obesity;Stress;Hypertension;Lipids;Diabetes Heart Failure;Weight Management/Obesity;Stress;Hypertension;Lipids;Diabetes Heart Failure;Weight Management/Obesity;Stress;Hypertension;Lipids;Diabetes;Tobacco Cessation Heart Failure;Weight Management/Obesity;Stress;Hypertension;Lipids;Diabetes;Tobacco Cessation    Review Deborah Coffey started intensive cardiac rehab on 02/08/22. Deborah Coffey did well with exercise for her fitness level. Vital signs and CBG's were stable. Deborah Coffey started intensive cardiac rehab on 02/08/22. Deborah Coffey did well with exercise for her fitness level. Vital signs and CBG's were stable. Deborah Coffey's last exercise session was on 02/10/22. Deborah Coffey stated that she quit cigarettes 6 months ago and cigars 13 days ago. Deborah Coffey shared that she graduated from the [place] program yesterday. Deborah Coffey states that when she gets the urge to smoke she puts on a nicotine patch. I congratulated her on her success. Deborah Coffey reported that she has started smoking 2 cigarettes a day. Vital sign and reported home CBG were stable today. Deborah Coffey reported having chornic left foot pain. Encouraged patient to follow up with her primary care provdier regarding this.    Expected Outcomes Deborah Coffey will continue to participate in intensive cardiac rehab for exercise, nutrition and lifestyle modifications Deborah Coffey will continue to participate in intensive cardiac rehab for exercise, nutrition and lifestyle modifications Deborah Coffey will maintain abstinence from tobacco products.  Deborah Coffey will continue to participate in intesive caridac rehab for exercise, nutrition and lifestyle modifications. Will continue to encourage smoking cessation             Exercise Goals and Review:  Exercise Goals     Row Name 02/04/22 1125             Exercise Goals   Increase Physical Activity Yes       Intervention Provide advice, education, support and counseling about physical activity/exercise needs.;Develop an individualized exercise prescription for aerobic and resistive training based on initial evaluation findings, risk stratification, comorbidities and participant's personal goals.       Expected Outcomes Short Term: Attend rehab on a regular basis to increase amount of physical activity.;Long Term: Add in home exercise to make exercise part of routine and to increase amount of physical activity.;Long Term: Exercising regularly at least 3-5 days a week.       Able to understand and use rate of perceived exertion (RPE) scale Yes       Intervention Provide education and explanation on how to use RPE scale       Expected Outcomes Short Term: Able to use RPE daily in rehab to express subjective intensity level;Long Term:  Able to use RPE to guide intensity level when exercising independently       Able to understand and use Dyspnea scale Yes       Intervention Provide education and explanation on how to use Dyspnea scale       Expected Outcomes Short Term: Able to use Dyspnea scale daily in rehab to express subjective sense of shortness of breath during exertion;Long Term: Able to use Dyspnea scale to guide intensity level when exercising independently       Knowledge and understanding of Target Heart Rate Range (THRR) Yes       Intervention Provide education and explanation of THRR including how the numbers  were predicted and where they are located for reference       Expected Outcomes Short Term: Able to state/look up THRR;Long Term: Able to use THRR to govern intensity when  exercising independently;Short Term: Able to use daily as guideline for intensity in rehab       Understanding of Exercise Prescription Yes       Intervention Provide education, explanation, and written materials on patient's individual exercise prescription       Expected Outcomes Short Term: Able to explain program exercise prescription;Long Term: Able to explain home exercise prescription to exercise independently                Exercise Goals Re-Evaluation:  Exercise Goals Re-Evaluation     Row Name 02/08/22 1131 02/23/22 1425 02/24/22 1055 03/03/22 1054 03/05/22 1035     Exercise Goal Re-Evaluation   Exercise Goals Review Increase Physical Activity;Able to understand and use rate of perceived exertion (RPE) scale -- Increase Physical Activity;Able to understand and use rate of perceived exertion (RPE) scale;Increase Strength and Stamina Increase Physical Activity;Able to understand and use rate of perceived exertion (RPE) scale;Increase Strength and Stamina;Knowledge and understanding of Target Heart Rate Range (THRR);Able to check pulse independently;Understanding of Exercise Prescription Increase Physical Activity;Able to understand and use rate of perceived exertion (RPE) scale;Increase Strength and Stamina;Knowledge and understanding of Target Heart Rate Range (THRR);Able to check pulse independently;Understanding of Exercise Prescription   Comments Patient able to understand and use RPE scale appropriately. Patient out sick x2 weeks. Unable to review goals at this time. Patient returned to exercise today after being out sick and tolerated exercise well. Patient's goal is to increase strength  and have less pain in her legs and feet. Reviewed exercisce prescription with Deborah Coffey. Deborah Coffey is not currently exercising at home but plans to start walking in the park. She will start walking ~65mnutes, 1-2 days/week and add 1 minute each session with a goal of achieving 30 minutes 1-2 days/week. Deborah Coffey  expressed interest in using the tread[facility]. [facility] but that's been ~8 months ago. Will incoporate the TM into her exercise prescription when she's ready. She states that she has already seen a difference in the amount of shortness of breath she experiences. She would like to lose weight as one of her goals. The added walking at home will help in addition to adhering to a heart healhty nutrition plan. Patient knows how to count her pulse. Patient states she walked at the store yesterday and tolerated well taking rest breaks. Patient plans to walk again this week at the park. Patient will hold off on starting the treadmill until she builds up her stamina.Encouraged patient to increase steps/minute on the NuStep, and patient is amenable to this. Will increase workload on NuStep next week.   Expected Outcomes Progress workloads as tolerated. Increase duration to 30 minutes as tolerated. Review goals upon return to cardiac rehab. Progress workloads as tolerated to help increase strength. Patient will add walking 1-2 days/week at the park or in her house in addition to exercise at cardiac reahb to achieve 150 minutes of aerobic exercise/week. Increaes SPM and WL on the NuStep to increase stamina.    RHudsonName 03/22/22 1027 04/07/22 1106           Exercise Goal Re-Evaluation   Exercise Goals Review Increase Physical Activity;Able to understand and use rate of perceived exertion (RPE) scale;Increase Strength and Stamina;Knowledge and understanding of Target  Heart Rate Range (THRR);Able to check pulse independently;Understanding of Exercise Prescription Increase Physical Activity;Able to understand and use rate of perceived exertion (RPE) scale;Increase Strength and Stamina;Knowledge and understanding of Target Heart Rate Range (THRR);Able to check pulse independently;Understanding of Exercise Prescription      Comments Patient returned to exercise today after being  out with swelling in her left foot. Patient has also been unable to walk at home for exercise due to edema in foot. Patient tolerated low intensity exercise without symptoms. Increased hand weights from 2 to 3 lbs today. Patient returned to exercise today after being out sick and continued issues with foot. Patient tolerated low intensity exercise. Patient feels like it's her first day. Her goal is to build back up after being out sick. Initially scheduled to graduate this Friday 12/22, but patient request extension x1 week.      Expected Outcomes Progress workloads as tolerated to regain strength and stamina. Progress workloads as tolerated to regain strength and stamina.               Nutrition & Weight - Outcomes:  Pre Biometrics - 02/04/22 1112       Pre Biometrics   Waist Circumference 50 inches    Hip Circumference 52 inches    Waist to Hip Ratio 0.96 %    Triceps Skinfold 30 mm    % Body Fat 50.4 %    Grip Strength 26 kg    Flexibility 10 in    Single Leg Stand 1.6 seconds              Nutrition:  Nutrition Therapy & Goals - 04/07/22 1029       Nutrition Therapy   Diet Heart Healthy/ Carbohydrate Consistent Diet    Drug/Food Interactions Statins/Certain Fruits      Personal Nutrition Goals   Nutrition Goal Patient to identify strategies for managing cardiovascular risk by attending the weekly [employer] educaiton and nutrition courses    Personal Goal #2 Patient to limit sodium intake to <1554m per day    Personal Goal #3 Patient to identify food sources and limit daily intake of saturated fat, sodium, trans fat, and refined carbohydrates    Personal Goal #4 Patient to identify strategies for blood sugar control to aid with A1c <7%.    Comments Goals in progress. Patient's recent  attendance to cardiac rehab has been quite variable as she has not attended since 03/24/22. When in attendance she does attend the [employer] education series. She continues to work on reducing  saturated fat/processed meat intake, increasing high fiber foods, and reducing/eliminating refined carbohydrates and simple sugars.      Intervention Plan   Intervention Prescribe, educate and counsel regarding individualized specific dietary modifications aiming towards targeted core components such as weight, hypertension, lipid management, diabetes, heart failure and other comorbidities.;Nutrition handout(s) given to patient.    Expected Outcomes Short Term Goal: Understand basic principles of dietary content, such as calories, fat, sodium, cholesterol and nutrients.;Long Term Goal: Adherence to prescribed nutrition plan.             Nutrition Discharge:   Education Questionnaire Score:  Knowledge Questionnaire Score - 02/04/22 1137       Knowledge Questionnaire Score   Pre Score 19/28             Goals reviewed with patient; copy given to patient.Pt  completed  Intensive/Traditional cardiac rehab program today with completion of  12 exercise and  10 education sessions. Pt maintained  good attendance and progressed nicely during their participation in rehab as evidenced by increased MET level. Deborah Coffey was out due to problems with her foot. Deborah Coffey did return but did not come back to repeat her 6 minute nustep test. We hope that [name] will continue to exercise on her own.Harrell Gave RN BSN

## 2022-04-30 NOTE — Addendum Note (Signed)
Encounter addended by: Magda Kiel, RN on: 04/30/2022 8:51 AM  Actions taken: Pend clinical note, Clinical Note Signed, Episode resolved

## 2022-05-03 ENCOUNTER — Ambulatory Visit: Payer: No Typology Code available for payment source | Attending: Family Medicine | Admitting: Family Medicine

## 2022-05-03 ENCOUNTER — Encounter: Payer: Self-pay | Admitting: Family Medicine

## 2022-05-03 VITALS — BP 123/78 | HR 61 | Ht 65.0 in | Wt 235.6 lb

## 2022-05-03 DIAGNOSIS — R918 Other nonspecific abnormal finding of lung field: Secondary | ICD-10-CM | POA: Diagnosis not present

## 2022-05-03 DIAGNOSIS — I25118 Atherosclerotic heart disease of native coronary artery with other forms of angina pectoris: Secondary | ICD-10-CM | POA: Diagnosis not present

## 2022-05-03 DIAGNOSIS — E66813 Obesity, class 3: Secondary | ICD-10-CM | POA: Insufficient documentation

## 2022-05-03 DIAGNOSIS — E2839 Other primary ovarian failure: Secondary | ICD-10-CM | POA: Diagnosis not present

## 2022-05-03 DIAGNOSIS — R5383 Other fatigue: Secondary | ICD-10-CM

## 2022-05-03 DIAGNOSIS — E1169 Type 2 diabetes mellitus with other specified complication: Secondary | ICD-10-CM | POA: Diagnosis not present

## 2022-05-03 DIAGNOSIS — M1A9XX0 Chronic gout, unspecified, without tophus (tophi): Secondary | ICD-10-CM | POA: Diagnosis not present

## 2022-05-03 DIAGNOSIS — Z794 Long term (current) use of insulin: Secondary | ICD-10-CM | POA: Diagnosis not present

## 2022-05-03 DIAGNOSIS — M109 Gout, unspecified: Secondary | ICD-10-CM | POA: Insufficient documentation

## 2022-05-03 DIAGNOSIS — I5032 Chronic diastolic (congestive) heart failure: Secondary | ICD-10-CM | POA: Diagnosis not present

## 2022-05-03 DIAGNOSIS — E1159 Type 2 diabetes mellitus with other circulatory complications: Secondary | ICD-10-CM

## 2022-05-03 DIAGNOSIS — I129 Hypertensive chronic kidney disease with stage 1 through stage 4 chronic kidney disease, or unspecified chronic kidney disease: Secondary | ICD-10-CM

## 2022-05-03 DIAGNOSIS — E66812 Obesity, class 2: Secondary | ICD-10-CM | POA: Insufficient documentation

## 2022-05-03 DIAGNOSIS — E1142 Type 2 diabetes mellitus with diabetic polyneuropathy: Secondary | ICD-10-CM

## 2022-05-03 DIAGNOSIS — E785 Hyperlipidemia, unspecified: Secondary | ICD-10-CM

## 2022-05-03 DIAGNOSIS — E1122 Type 2 diabetes mellitus with diabetic chronic kidney disease: Secondary | ICD-10-CM

## 2022-05-03 LAB — POCT GLYCOSYLATED HEMOGLOBIN (HGB A1C): HbA1c, POC (controlled diabetic range): 6.9 % (ref 0.0–7.0)

## 2022-05-03 LAB — GLUCOSE, POCT (MANUAL RESULT ENTRY): POC Glucose: 108 mg/dl — AB (ref 70–99)

## 2022-05-03 MED ORDER — ATORVASTATIN CALCIUM 10 MG PO TABS: 10.0000 mg | ORAL_TABLET | Freq: Every day | ORAL | 1 refills | Status: DC

## 2022-05-03 MED ORDER — DAPAGLIFLOZIN PROPANEDIOL 5 MG PO TABS: 5.0000 mg | ORAL_TABLET | Freq: Every day | ORAL | 1 refills | Status: DC

## 2022-05-03 MED ORDER — METFORMIN HCL ER 500 MG PO TB24: 1000.0000 mg | ORAL_TABLET | Freq: Two times a day (BID) | ORAL | 1 refills | Status: DC

## 2022-05-03 MED ORDER — LISINOPRIL 10 MG PO TABS: 10.0000 mg | ORAL_TABLET | Freq: Every day | ORAL | 1 refills | Status: DC

## 2022-05-03 MED ORDER — GABAPENTIN 300 MG PO CAPS: ORAL_CAPSULE | ORAL | 1 refills | Status: DC

## 2022-05-03 MED ORDER — TOUJEO MAX SOLOSTAR 300 UNIT/ML ~~LOC~~ SOPN: 56.0000 [IU] | PEN_INJECTOR | Freq: Every day | SUBCUTANEOUS | 6 refills | Status: DC

## 2022-05-03 MED ORDER — METOPROLOL TARTRATE 50 MG PO TABS: 50.0000 mg | ORAL_TABLET | Freq: Two times a day (BID) | ORAL | 1 refills | Status: DC

## 2022-05-03 NOTE — Patient Instructions (Signed)
Calorie Counting for Weight Loss Calories are units of energy. Your body needs a certain number of calories from food to keep going throughout the day. When you eat or drink more calories than your body needs, your body stores the extra calories mostly as fat. When you eat or drink fewer calories than your body needs, your body burns fat to get the energy it needs. Calorie counting means keeping track of how many calories you eat and drink each day. Calorie counting can be helpful if you need to lose weight. If you eat fewer calories than your body needs, you should lose weight. Ask your health care provider what a healthy weight is for you. For calorie counting to work, you will need to eat the right number of calories each day to lose a healthy amount of weight per week. A dietitian can help you figure out how many calories you need in a day and will suggest ways to reach your calorie goal. A healthy amount of weight to lose each week is usually 1-2 lb (0.5-0.9 kg). This usually means that your daily calorie intake should be reduced by 500-750 calories. Eating 1,200-1,500 calories a day can help most women lose weight. Eating 1,500-1,800 calories a day can help most men lose weight. What do I need to know about calorie counting? Work with your health care provider or dietitian to determine how many calories you should get each day. To meet your daily calorie goal, you will need to: Find out how many calories are in each food that you would like to eat. Try to do this before you eat. Decide how much of the food you plan to eat. Keep a food log. Do this by writing down what you ate and how many calories it had. To successfully lose weight, it is important to balance calorie counting with a healthy lifestyle that includes regular activity. Where do I find calorie information?  The number of calories in a food can be found on a Nutrition Facts label. If a food does not have a Nutrition Facts label, try  to look up the calories online or ask your dietitian for help. Remember that calories are listed per serving. If you choose to have more than one serving of a food, you will have to multiply the calories per serving by the number of servings you plan to eat. For example, the label on a package of bread might say that a serving size is 1 slice and that there are 90 calories in a serving. If you eat 1 slice, you will have eaten 90 calories. If you eat 2 slices, you will have eaten 180 calories. How do I keep a food log? After each time that you eat, record the following in your food log as soon as possible: What you ate. Be sure to include toppings, sauces, and other extras on the food. How much you ate. This can be measured in cups, ounces, or number of items. How many calories were in each food and drink. The total number of calories in the food you ate. Keep your food log near you, such as in a pocket-sized notebook or on an app or website on your mobile phone. Some programs will calculate calories for you and show you how many calories you have left to meet your daily goal. What are some portion-control tips? Know how many calories are in a serving. This will help you know how many servings you can have of a certain   food. Use a measuring cup to measure serving sizes. You could also try weighing out portions on a kitchen scale. With time, you will be able to estimate serving sizes for some foods. Take time to put servings of different foods on your favorite plates or in your favorite bowls and cups so you know what a serving looks like. Try not to eat straight from a food's packaging, such as from a bag or box. Eating straight from the package makes it hard to see how much you are eating and can lead to overeating. Put the amount you would like to eat in a cup or on a plate to make sure you are eating the right portion. Use smaller plates, glasses, and bowls for smaller portions and to prevent  overeating. Try not to multitask. For example, avoid watching TV or using your computer while eating. If it is time to eat, sit down at a table and enjoy your food. This will help you recognize when you are full. It will also help you be more mindful of what and how much you are eating. What are tips for following this plan? Reading food labels Check the calorie count compared with the serving size. The serving size may be smaller than what you are used to eating. Check the source of the calories. Try to choose foods that are high in protein, fiber, and vitamins, and low in saturated fat, trans fat, and sodium. Shopping Read nutrition labels while you shop. This will help you make healthy decisions about which foods to buy. Pay attention to nutrition labels for low-fat or fat-free foods. These foods sometimes have the same number of calories or more calories than the full-fat versions. They also often have added sugar, starch, or salt to make up for flavor that was removed with the fat. Make a grocery list of lower-calorie foods and stick to it. Cooking Try to cook your favorite foods in a healthier way. For example, try baking instead of frying. Use low-fat dairy products. Meal planning Use more fruits and vegetables. One-half of your plate should be fruits and vegetables. Include lean proteins, such as chicken, turkey, and fish. Lifestyle Each week, aim to do one of the following: 150 minutes of moderate exercise, such as walking. 75 minutes of vigorous exercise, such as running. General information Know how many calories are in the foods you eat most often. This will help you calculate calorie counts faster. Find a way of tracking calories that works for you. Get creative. Try different apps or programs if writing down calories does not work for you. What foods should I eat?  Eat nutritious foods. It is better to have a nutritious, high-calorie food, such as an avocado, than a food with  few nutrients, such as a bag of potato chips. Use your calories on foods and drinks that will fill you up and will not leave you hungry soon after eating. Examples of foods that fill you up are nuts and nut butters, vegetables, lean proteins, and high-fiber foods such as whole grains. High-fiber foods are foods with more than 5 g of fiber per serving. Pay attention to calories in drinks. Low-calorie drinks include water and unsweetened drinks. The items listed above may not be a complete list of foods and beverages you can eat. Contact a dietitian for more information. What foods should I limit? Limit foods or drinks that are not good sources of vitamins, minerals, or protein or that are high in unhealthy fats. These   include: Candy. Other sweets. Sodas, specialty coffee drinks, alcohol, and juice. The items listed above may not be a complete list of foods and beverages you should avoid. Contact a dietitian for more information. How do I count calories when eating out? Pay attention to portions. Often, portions are much larger when eating out. Try these tips to keep portions smaller: Consider sharing a meal instead of getting your own. If you get your own meal, eat only half of it. Before you start eating, ask for a container and put half of your meal into it. When available, consider ordering smaller portions from the menu instead of full portions. Pay attention to your food and drink choices. Knowing the way food is cooked and what is included with the meal can help you eat fewer calories. If calories are listed on the menu, choose the lower-calorie options. Choose dishes that include vegetables, fruits, whole grains, low-fat dairy products, and lean proteins. Choose items that are boiled, broiled, grilled, or steamed. Avoid items that are buttered, battered, fried, or served with cream sauce. Items labeled as crispy are usually fried, unless stated otherwise. Choose water, low-fat milk,  unsweetened iced tea, or other drinks without added sugar. If you want an alcoholic beverage, choose a lower-calorie option, such as a glass of wine or light beer. Ask for dressings, sauces, and syrups on the side. These are usually high in calories, so you should limit the amount you eat. If you want a salad, choose a garden salad and ask for grilled meats. Avoid extra toppings such as bacon, cheese, or fried items. Ask for the dressing on the side, or ask for olive oil and vinegar or lemon to use as dressing. Estimate how many servings of a food you are given. Knowing serving sizes will help you be aware of how much food you are eating at restaurants. Where to find more information Centers for Disease Control and Prevention: www.cdc.gov U.S. Department of Agriculture: myplate.gov Summary Calorie counting means keeping track of how many calories you eat and drink each day. If you eat fewer calories than your body needs, you should lose weight. A healthy amount of weight to lose per week is usually 1-2 lb (0.5-0.9 kg). This usually means reducing your daily calorie intake by 500-750 calories. The number of calories in a food can be found on a Nutrition Facts label. If a food does not have a Nutrition Facts label, try to look up the calories online or ask your dietitian for help. Use smaller plates, glasses, and bowls for smaller portions and to prevent overeating. Use your calories on foods and drinks that will fill you up and not leave you hungry shortly after a meal. This information is not intended to replace advice given to you by your health care provider. Make sure you discuss any questions you have with your health care provider. Document Revised: 05/17/2019 Document Reviewed: 05/17/2019 Elsevier Patient Education  2023 Elsevier Inc.  

## 2022-05-03 NOTE — Progress Notes (Signed)
Subjective:  Patient ID: Deborah Coffey, female    DOB: 23-Aug-1955  Age: 67 y.o. MRN: [id number]  CC: Diabetes   HPI Deborah Coffey is a 67 y.o. year old female with a history of  Type 2 DM (A1c 6.9), Diabetic neuropathy, Hypertension, CAD (s/p CABG), pulmonary hypertension with RV strain, history of COVID. here for a follow-up visit    Interval History:  A1c 6.9 down from 8.2 previously.  She denies presence of hypoglycemia, numbness in extremities, visual concerns.  Endorses medication adherence. Doing well on her statin and antihypertensive.  She Complains of fatigue all the time and feels her energy is drained and this has been present 'for a while'.  She has not seen her cardiologist recently but has been undergoing cardiac rehab.  Denies presence of chest pain. Low-dose chest CT from 08/2021 had revealed presence of bilateral lung nodules with recommendation to repeat in 6 months. Past Medical History:  Diagnosis Date   Allergy    Anemia    CAD in native artery 02/18/2004   Mid LAD lesoin just after Large D1 --> CABG x 1 LIMA-LAD   CAP (community acquired pneumonia) 08/07/2014   Chronic kidney disease, stage 3b ([place])    Depression    High cholesterol    History of blood transfusion 04/20/2003   "related to [facility]"    Hypertension    Lung nodules    Mild carotid artery disease ([place])    Pulmonary hypertension ([place])    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 competitive flow   TIA (transient ischemic attack)    Type II diabetes mellitus ([place])     Past Surgical History:  Procedure Laterality Date   CARDIAC CATHETERIZATION  2010   Previous LAD 95% lesion - now ~30-40%; patent LIMA with competitive flow   CESAREAN SECTION  1977; 1989   CHOLECYSTECTOMY  ~ 2012   CORONARY ARTERY BYPASS GRAFT  02/2004   "CABG X1" (03/22/2013); LIMA-LAD   RIGHT/LEFT HEART CATH AND CORONARY/GRAFT ANGIOGRAPHY N/A 04/16/2021    Procedure: RIGHT/LEFT HEART CATH AND CORONARY/GRAFT ANGIOGRAPHY;  Surgeon: Jettie Booze, MD;  Location: [facility];  Service: Cardiovascular;  Laterality: N/A;   TUBAL LIGATION  1989    Family History  Problem Relation Age of Onset   Heart disease Mother    Diabetes Mother    Cancer Mother        cervical   Diabetes Father    Diabetes Sister    Heart disease Sister    Diabetes Brother    Heart attack Brother    Lupus Sister    Heart failure Sister    Hepatitis C Sister    Heart failure Brother    Diabetes Brother    Hypertension Daughter    Colon cancer Neg Hx     Social History   Socioeconomic History   Marital status: Divorced    Spouse name: Not on file   Number of children: Not on file   Years of education: 12   Highest education level: Some college, no degree  Occupational History   Occupation: Disabled  Tobacco Use   Smoking status: Some Days    Packs/day: 0.25    Years: 42.00    Total pack years: 10.50    Types: Cigarettes   Smokeless tobacco: Never   Tobacco comments:    Smokes 1-3 cigars/ black cigarettes a week. Agreed to referral to [facility] quit  line. Referral faxed. Patient given cessation information  Vaping Use   Vaping Use: Never used  Substance and Sexual Activity   Alcohol use: Not Currently   Drug use: No    Types: Marijuana    Comment: smoked pot years ago but not currently   Sexual activity: Not Currently  Other Topics Concern   Not on file  Social History Narrative   Lives with daughter and 3 grandchildren   Social Determinants of Health   Financial Resource Strain: Not on file  Food Insecurity: Not on file  Transportation Needs: Not on file  Physical Activity: Not on file  Stress: Not on file  Social Connections: Not on file    Allergies  Allergen Reactions   Ciprofloxacin Rash    Outpatient Medications Prior to Visit  Medication Sig Dispense Refill   acetaminophen (TYLENOL) 500 MG tablet Take 1,000 mg by mouth  every 6 (six) hours as needed for headache (pain).      Alcohol Swabs (B-D SINGLE USE SWABS REGULAR) PADS USE AS DIRECTED 100 each 0   aspirin EC 81 MG tablet Take 81 mg by mouth daily.     Blood Glucose Monitoring Suppl (ONETOUCH VERIO) w/Device KIT Use as instructed to test blood glucose 3 times daily. 1 kit 0   clobetasol cream (TEMOVATE) 0.05 % Apply topically 2 (two) times daily. (Patient taking differently: Apply 1 Application topically daily as needed (Rash).) 60 g 0   Continuous Blood Gluc Sensor (DEXCOM G6 SENSOR) MISC Use to check blood sugar three times daily. Dx. E11.59 3 each 3   diclofenac Sodium (VOLTAREN) 1 % GEL Apply 2 g topically daily as needed (muscle pain).     furosemide (LASIX) 20 MG tablet Take 2 tablets (40 mg total) by mouth 2 (two) times daily. 360 tablet 0   glucose blood (ONETOUCH VERIO) test strip Use as instructed to test blood glucose 3 times daily. 100 each 3   Insulin Pen Needle (DROPLET PEN NEEDLES) 31G X 8 MM MISC USE WITH TOUJEO SOLOSTAR PEN EVERY DAY 100 each 0   Lancets (ONETOUCH DELICA PLUS [id number]) MISC Use as instructed to test blood glucose 3 times daily. 100 each 3   Multiple Vitamin (MULTIVITAMIN WITH MINERALS) TABS tablet Take 1 tablet by mouth daily.     nitroGLYCERIN (NITROSTAT) 0.4 MG SL tablet Place 1 tablet (0.4 mg total) under the tongue every 5 (five) minutes as needed for chest pain. 25 tablet 3   pantoprazole (PROTONIX) 40 MG tablet Take 1 tablet (40 mg total) by mouth 2 (two) times daily. 180 tablet 0   atorvastatin (LIPITOR) 10 MG tablet Take 1 tablet (10 mg total) by mouth once daily. 90 tablet 0   dapagliflozin propanediol (FARXIGA) 5 MG TABS tablet Take 1 tablet (5 mg total) by mouth daily before breakfast. 90 tablet 0   gabapentin (NEURONTIN) 300 MG capsule Take orally 2 caps (600 mg) in the morning and 3 capsules (900 mg) in the evening 450 capsule 1   insulin glargine, 2 Unit Dial, (TOUJEO MAX SOLOSTAR) 300 UNIT/ML Solostar Pen Inject  56 Units into the skin daily. 6 mL 2   lisinopril (ZESTRIL) 20 MG tablet Take 0.5 tablets (10 mg total) by mouth daily. 30 tablet 6   metFORMIN (GLUCOPHAGE-XR) 500 MG 24 hr tablet Take 2 tablets (1,000 mg total) by mouth 2 (two) times daily. 360 tablet 0   metoprolol tartrate (LOPRESSOR) 50 MG tablet Take 1 tablet (50 mg total) by mouth 2 (  two) times daily. 14 tablet 0   No facility-administered medications prior to visit.     ROS Review of Systems  Constitutional:  Positive for fatigue. Negative for activity change and appetite change.  HENT:  Negative for sinus pressure and sore throat.   Respiratory:  Negative for chest tightness, shortness of breath and wheezing.   Cardiovascular:  Negative for chest pain and palpitations.  Gastrointestinal:  Negative for abdominal distention, abdominal pain and constipation.  Genitourinary: Negative.   Musculoskeletal: Negative.   Psychiatric/Behavioral:  Negative for behavioral problems and dysphoric mood.     Objective:  BP 123/78   Pulse 61   Ht '5\' 5"'$  (1.651 m)   Wt 235 lb 9.6 oz (106.9 kg)   LMP  (LMP Unknown)   SpO2 98%   BMI 39.21 kg/m      05/03/2022    2:34 PM 04/09/2022   10:22 AM 02/04/2022   11:12 AM  BP/Weight  Systolic BP 621 308 657  Diastolic BP 78 60 68  Wt. (Lbs) 235.6 238.1 233.25  BMI 39.21 kg/m2 39.93 kg/m2 39.11 kg/m2      Physical Exam Constitutional:      Appearance: She is well-developed.  Cardiovascular:     Rate and Rhythm: Normal rate.     Heart sounds: Normal heart sounds. No murmur heard. Pulmonary:     Effort: Pulmonary effort is normal.     Breath sounds: Normal breath sounds. No wheezing or rales.  Chest:     Chest wall: No tenderness.  Abdominal:     General: Bowel sounds are normal. There is no distension.     Palpations: Abdomen is soft. There is no mass.     Tenderness: There is no abdominal tenderness.  Musculoskeletal:        General: Normal range of motion.     Right lower leg:  No edema.     Left lower leg: Edema (1+ non pitting ankle edema) present.  Neurological:     Mental Status: She is alert and oriented to person, place, and time.  Psychiatric:        Mood and Affect: Mood normal.        Latest Ref Rng & Units 01/07/2022   10:40 AM 12/28/2021   11:19 AM 12/18/2021   12:30 PM  CMP  Glucose 70 - 99 mg/dL 143  219  133   BUN 8 - 23 mg/dL '28  22  28   '$ Creatinine 0.44 - 1.00 mg/dL 1.34  1.27  1.43   Sodium 135 - 145 mmol/L 141  139  141   Potassium 3.5 - 5.1 mmol/L 4.3  5.3  4.6   Chloride 98 - 111 mmol/L 108  106  108   CO2 22 - 32 mmol/L '25  24  25   '$ Calcium 8.9 - 10.3 mg/dL 9.7  9.4  9.3     Lipid Panel     Component Value Date/Time   CHOL 161 01/21/2021 1618   TRIG 104 01/21/2021 1618   HDL 72 01/21/2021 1618   CHOLHDL 2.2 01/21/2021 1618   CHOLHDL 2.2 09/02/2016 0533   VLDL 13 09/02/2016 0533   LDLCALC 70 01/21/2021 1618    CBC    Component Value Date/Time   WBC 8.6 12/18/2021 1230   RBC 3.98 12/18/2021 1230   HGB 10.9 (L) 12/18/2021 1230   HGB 12.1 04/10/2021 1248   HCT 35.5 (L) 12/18/2021 1230   HCT 37.0 04/10/2021 1248   PLT 266  12/18/2021 1230   PLT 261 04/10/2021 1248   MCV 89.2 12/18/2021 1230   MCV 83 04/10/2021 1248   MCH 27.4 12/18/2021 1230   MCHC 30.7 12/18/2021 1230   RDW 16.9 (H) 12/18/2021 1230   RDW 13.7 04/10/2021 1248   LYMPHSABS 1.5 12/18/2021 1230   LYMPHSABS 1.9 01/21/2021 1618   MONOABS 0.7 12/18/2021 1230   EOSABS 0.1 12/18/2021 1230   EOSABS 0.1 01/21/2021 1618   BASOSABS 0.0 12/18/2021 1230   BASOSABS 0.0 01/21/2021 1618    Lab Results  Component Value Date   HGBA1C 6.9 05/03/2022   Lab Results  Component Value Date   TSH 1.510 02/27/2021    Assessment & Plan:  1. Type 2 diabetes mellitus with other circulatory complication, with long-term current use of insulin (HCC) Controlled with A1c of 6.9 Commended on improvement Continue current regimen Counseled on Diabetic diet, my plate method,  643 minutes of moderate intensity exercise/week Blood sugar logs with fasting goals of 80-120 mg/dl, random of less than 180 and in the event of sugars less than 60 mg/dl or greater than 400 mg/dl encouraged to notify the clinic. Advised on the need for annual eye exams, annual foot exams, Pneumonia vaccine. - POCT glucose (manual entry) - POCT glycosylated hemoglobin (Hb A1C) - Microalbumin / creatinine urine ratio; Future - LP+Non-HDL Cholesterol; Future - CMP14+EGFR; Future - CBC with Differential/Platelet; Future - dapagliflozin propanediol (FARXIGA) 5 MG TABS tablet; Take 1 tablet (5 mg total) by mouth daily before breakfast.  Dispense: 90 tablet; Refill: 1 - insulin glargine, 2 Unit Dial, (TOUJEO MAX SOLOSTAR) 300 UNIT/ML Solostar Pen; Inject 56 Units into the skin daily.  Dispense: 6 mL; Refill: 6  2. Estrogen deficiency - DG Bone Density; Future  3. Chronic gout without tophus, unspecified cause, unspecified site History of gout in the past No acute flare at this time Counseled on low purine eating plan  4. Lung nodules - CT CHEST NODULE FOLLOW UP LOW DOSE W/O; Future  5. Hyperlipidemia associated with type 2 diabetes mellitus ([place]) Controlled Due for lipid panel which has been ordered Low-cholesterol diet - atorvastatin (LIPITOR) 10 MG tablet; Take 1 tablet (10 mg total) by mouth once daily.  Dispense: 90 tablet; Refill: 1 - metFORMIN (GLUCOPHAGE-XR) 500 MG 24 hr tablet; Take 2 tablets (1,000 mg total) by mouth 2 (two) times daily.  Dispense: 360 tablet; Refill: 1  6. Diabetic polyneuropathy associated with type 2 diabetes mellitus (HCC) Stable - gabapentin (NEURONTIN) 300 MG capsule; Take orally 2 caps (600 mg) in the morning and 3 capsules (900 mg) in the evening  Dispense: 450 capsule; Refill: 1  7. Hypertension associated with chronic kidney disease due to type 2 diabetes mellitus ([place]) Controlled Counseled on blood pressure goal of less than 130/80, low-sodium, DASH  diet, medication compliance, 150 minutes of moderate intensity exercise per week. Discussed medication compliance, adverse effects. - lisinopril (ZESTRIL) 10 MG tablet; Take 1 tablet (10 mg total) by mouth daily.  Dispense: 90 tablet; Refill: 1 - metoprolol tartrate (LOPRESSOR) 50 MG tablet; Take 1 tablet (50 mg total) by mouth 2 (two) times daily.  Dispense: 180 tablet; Refill: 1  8. Chronic diastolic CHF (congestive heart failure) (HCC) Euvolemic with EF of 65 to 70% from echo of 03/2021 Unilateral left ankle edema-advised to use compression stockings, limit fluid intake Undergoing cardiac rehab Follow-up with cardiology - metoprolol tartrate (LOPRESSOR) 50 MG tablet; Take 1 tablet (50 mg total) by mouth 2 (two) times daily.  Dispense: 180  tablet; Refill: 1  9. Morbid obesity ([place]) Counseled on weight loss, caloric restriction, continue exercise Consider GLP-1 RA at next visit  10. Atherosclerotic heart disease of native coronary artery with other forms of angina pectoris ([place]) Currently on statin Risk factor modification  11.  Fatigue We will check CBC, thyroid labs, vitamin D  Meds ordered this encounter  Medications   atorvastatin (LIPITOR) 10 MG tablet    Sig: Take 1 tablet (10 mg total) by mouth once daily.    Dispense:  90 tablet    Refill:  1   dapagliflozin propanediol (FARXIGA) 5 MG TABS tablet    Sig: Take 1 tablet (5 mg total) by mouth daily before breakfast.    Dispense:  90 tablet    Refill:  1   gabapentin (NEURONTIN) 300 MG capsule    Sig: Take orally 2 caps (600 mg) in the morning and 3 capsules (900 mg) in the evening    Dispense:  450 capsule    Refill:  1    Dose change   insulin glargine, 2 Unit Dial, (TOUJEO MAX SOLOSTAR) 300 UNIT/ML Solostar Pen    Sig: Inject 56 Units into the skin daily.    Dispense:  6 mL    Refill:  6   lisinopril (ZESTRIL) 10 MG tablet    Sig: Take 1 tablet (10 mg total) by mouth daily.    Dispense:  90 tablet    Refill:  1     Dose increase   metFORMIN (GLUCOPHAGE-XR) 500 MG 24 hr tablet    Sig: Take 2 tablets (1,000 mg total) by mouth 2 (two) times daily.    Dispense:  360 tablet    Refill:  1   metoprolol tartrate (LOPRESSOR) 50 MG tablet    Sig: Take 1 tablet (50 mg total) by mouth 2 (two) times daily.    Dispense:  180 tablet    Refill:  1    Follow-up: Return in about 6 months (around 11/01/2022) for Chronic medical conditions.    Visit required 46 minutes of patient care including median intraservice time, reviewing previous notes and test results, coordination of care, counseling the patient in addition to management of chronic medical conditions.Time also spent ordering medications, investigations and documenting in the chart.  All questions were answered to the patient's satisfaction    Charlott Rakes, MD, FAAFP. [facility] [place], [place]   05/03/2022, 6:20 PM

## 2022-05-07 ENCOUNTER — Other Ambulatory Visit: Payer: Self-pay | Admitting: Family Medicine

## 2022-05-07 DIAGNOSIS — Z1231 Encounter for screening mammogram for malignant neoplasm of breast: Secondary | ICD-10-CM

## 2022-06-18 ENCOUNTER — Encounter: Payer: Self-pay | Admitting: Family Medicine

## 2022-06-23 ENCOUNTER — Ambulatory Visit
Admission: RE | Admit: 2022-06-23 | Discharge: 2022-06-23 | Disposition: A | Discharge status: Home / Self Care | Payer: Medicare PPO | Source: Ambulatory Visit | Attending: Family Medicine | Admitting: Family Medicine

## 2022-06-23 DIAGNOSIS — R918 Other nonspecific abnormal finding of lung field: Secondary | ICD-10-CM

## 2022-06-24 ENCOUNTER — Other Ambulatory Visit: Payer: Self-pay | Admitting: Family Medicine

## 2022-06-24 DIAGNOSIS — R911 Solitary pulmonary nodule: Secondary | ICD-10-CM

## 2022-06-29 ENCOUNTER — Telehealth: Payer: Self-pay | Admitting: Family Medicine

## 2022-06-29 NOTE — Telephone Encounter (Signed)
Called patient to schedule Medicare Annual Wellness Visit (AWV). Left message for patient to call back and schedule Medicare Annual Wellness Visit (AWV).  Last date of AWV: 06/01/21   If any questions, please contact me at [phone].  Thank you ,  Barkley Boards AWV direct phone # [phone]

## 2022-07-22 ENCOUNTER — Other Ambulatory Visit (HOSPITAL_COMMUNITY): Payer: Self-pay

## 2022-07-22 LAB — HM DIABETES EYE EXAM

## 2022-07-22 MED ORDER — PREDNISOLONE ACETATE 1 % OP SUSP
1.0000 [drp] | Freq: Three times a day (TID) | OPHTHALMIC | 1 refills | Status: DC
  Filled 2022-07-22: qty 10, 67d supply, fill #0
  Filled 2022-07-22: qty 10, 10d supply, fill #0

## 2022-07-22 MED ORDER — OFLOXACIN 0.3 % OP SOLN
1.0000 [drp] | Freq: Three times a day (TID) | OPHTHALMIC | 1 refills | Status: DC
  Filled 2022-07-22: qty 10, 25d supply, fill #0

## 2022-07-22 MED ORDER — KETOROLAC TROMETHAMINE 0.5 % OP SOLN
1.0000 [drp] | Freq: Three times a day (TID) | OPHTHALMIC | 1 refills | Status: DC
  Filled 2022-07-22: qty 10, 10d supply, fill #0

## 2022-07-23 ENCOUNTER — Other Ambulatory Visit: Payer: Self-pay

## 2022-07-23 ENCOUNTER — Other Ambulatory Visit (HOSPITAL_COMMUNITY): Payer: Self-pay

## 2022-07-30 ENCOUNTER — Other Ambulatory Visit (HOSPITAL_COMMUNITY): Payer: Self-pay

## 2022-08-06 ENCOUNTER — Other Ambulatory Visit (HOSPITAL_COMMUNITY): Payer: Self-pay

## 2022-08-12 ENCOUNTER — Telehealth: Payer: Self-pay | Admitting: Family Medicine

## 2022-08-12 NOTE — Telephone Encounter (Signed)
Contacted Janyth Contes to schedule their annual wellness visit. Appointment made for 08/19/22.  Rudell Cobb AWV direct phone # [phone]

## 2022-08-19 ENCOUNTER — Ambulatory Visit: Payer: Medicare PPO | Attending: Family Medicine

## 2022-08-19 VITALS — Ht 65.0 in | Wt 233.0 lb

## 2022-08-19 DIAGNOSIS — Z0000 Encounter for general adult medical examination without abnormal findings: Secondary | ICD-10-CM

## 2022-08-19 NOTE — Progress Notes (Signed)
I connected with  Deborah Coffey on 08/19/22 by a audio enabled telemedicine application and verified that I am speaking with the correct person using two identifiers.  Patient Location: Home  Provider Location: Office/Clinic  I discussed the limitations of evaluation and management by telemedicine. The patient expressed understanding and agreed to proceed.  Subjective:   Deborah Coffey is a 67 y.o. female who presents for Medicare Annual (Subsequent) preventive examination.  Review of Systems     Cardiac Risk Factors include: advanced age (>49men, >20 women);diabetes mellitus;dyslipidemia;hypertension;obesity (BMI >30kg/m2)     Objective:    Today's Vitals   08/19/22 1032  Weight: 233 lb (105.7 kg)  Height: 5\' 5"  (1.651 m)   Body mass index is 38.77 kg/m.     08/19/2022   10:41 AM 12/18/2021   11:39 AM 09/04/2021   12:54 PM 07/05/2021    8:00 PM 06/01/2021    3:07 PM 04/16/2021    6:59 AM 10/04/2019    9:06 AM  Advanced Directives  Does Patient Have a Medical Advance Directive? No No No No No No No  Would patient like information on creating a medical advance directive?   No - Patient declined No - Patient declined  No - Patient declined     Current Medications (verified) Outpatient Encounter Medications as of 08/19/2022  Medication Sig   acetaminophen (TYLENOL) 500 MG tablet Take 1,000 mg by mouth every 6 (six) hours as needed for headache (pain).    Alcohol Swabs (B-D SINGLE USE SWABS REGULAR) PADS USE AS DIRECTED   aspirin EC 81 MG tablet Take 81 mg by mouth daily.   atorvastatin (LIPITOR) 10 MG tablet Take 1 tablet (10 mg total) by mouth once daily.   Blood Glucose Monitoring Suppl (ONETOUCH VERIO) w/Device KIT Use as instructed to test blood glucose 3 times daily.   clobetasol cream (TEMOVATE) 0.05 % Apply topically 2 (two) times daily. (Patient taking differently: Apply 1 Application topically daily as needed (Rash).)   dapagliflozin propanediol (FARXIGA) 5 MG  TABS tablet Take 1 tablet (5 mg total) by mouth daily before breakfast.   diclofenac Sodium (VOLTAREN) 1 % GEL Apply 2 g topically daily as needed (muscle pain).   furosemide (LASIX) 20 MG tablet Take 2 tablets (40 mg total) by mouth 2 (two) times daily.   gabapentin (NEURONTIN) 300 MG capsule Take orally 2 caps (600 mg) in the morning and 3 capsules (900 mg) in the evening   glucose blood (ONETOUCH VERIO) test strip Use as instructed to test blood glucose 3 times daily.   insulin glargine, 2 Unit Dial, (TOUJEO MAX SOLOSTAR) 300 UNIT/ML Solostar Pen Inject 56 Units into the skin daily.   Insulin Pen Needle (DROPLET PEN NEEDLES) 31G X 8 MM MISC USE WITH TOUJEO SOLOSTAR PEN EVERY DAY   Lancets (ONETOUCH DELICA PLUS LANCET33G) MISC Use as instructed to test blood glucose 3 times daily.   lisinopril (ZESTRIL) 10 MG tablet Take 1 tablet (10 mg total) by mouth daily.   metFORMIN (GLUCOPHAGE-XR) 500 MG 24 hr tablet Take 2 tablets (1,000 mg total) by mouth 2 (two) times daily.   metoprolol tartrate (LOPRESSOR) 50 MG tablet Take 1 tablet (50 mg total) by mouth 2 (two) times daily.   Multiple Vitamin (MULTIVITAMIN WITH MINERALS) TABS tablet Take 1 tablet by mouth daily.   nitroGLYCERIN (NITROSTAT) 0.4 MG SL tablet Place 1 tablet (0.4 mg total) under the tongue every 5 (five) minutes as needed for chest pain.   Continuous Blood Gluc  Sensor (DEXCOM G6 SENSOR) MISC Use to check blood sugar three times daily. Dx. E11.59 (Patient not taking: Reported on 08/19/2022)   pantoprazole (PROTONIX) 40 MG tablet Take 1 tablet (40 mg total) by mouth 2 (two) times daily. (Patient not taking: Reported on 08/19/2022)   No facility-administered encounter medications on file as of 08/19/2022.    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 competitive flow   TIA (transient ischemic attack)    Type II diabetes mellitus (HCC)    Past Surgical History:  Procedure Laterality Date   CARDIAC CATHETERIZATION  2010   Previous LAD 95% lesion - now ~30-40%; patent LIMA with competitive flow   CESAREAN SECTION  1977; 1989   CHOLECYSTECTOMY  ~ 2012   CORONARY ARTERY BYPASS GRAFT  02/2004   "CABG X1" (03/22/2013); LIMA-LAD   RIGHT/LEFT HEART CATH AND CORONARY/GRAFT ANGIOGRAPHY N/A 04/16/2021   Procedure: RIGHT/LEFT HEART CATH AND CORONARY/GRAFT ANGIOGRAPHY;  Surgeon: Corky Crafts, MD;  Location: [facility];  Service: Cardiovascular;  Laterality: N/A;   TUBAL LIGATION  1989   Family History  Problem Relation Age of Onset   Heart disease Mother    Diabetes Mother    Cancer Mother        cervical   Diabetes Father    Diabetes Sister    Heart disease Sister    Diabetes Brother    Heart attack Brother    Lupus Sister    Heart failure Sister    Hepatitis C Sister    Heart failure Brother    Diabetes Brother    Hypertension Daughter    Colon cancer Neg Hx    Social History   Socioeconomic History   Marital status: Divorced    Spouse name: Not on file   Number of children: Not on file   Years of education: 12   Highest education level: Some college, no degree  Occupational History   Occupation: Disabled  Tobacco Use   Smoking status: Former    Packs/day: 0.25    Years: 42.00    Additional pack years: 0.00    Total pack years: 10.50    Types: Cigarettes   Smokeless tobacco: Never   Tobacco comments:    Smokes 1-3 cigars/ black cigarettes a week.  Agreed to referral to [place] quit line. Referral faxed. Patient given cessation information  Vaping Use   Vaping Use: Never used  Substance and Sexual Activity   Alcohol use: Not Currently   Drug use: No    Types: Marijuana    Comment: smoked pot years ago but not currently   Sexual activity: Not Currently  Other Topics Concern   Not on file  Social History Narrative   Lives with daughter and 3 grandchildren   Social Determinants of Health   Financial Resource Strain: Low Risk  (  08/19/2022)   Overall Financial Resource Strain (CARDIA)    Difficulty of Paying Living Expenses: Not hard at all  Food Insecurity: No Food Insecurity (08/19/2022)   Hunger Vital Sign    Worried About Running Out of Food in the Last Year: Never true    Ran Out of Food in the Last Year: Never true  Transportation Needs: No Transportation Needs (08/19/2022)   PRAPARE - Administrator, Civil Service (Medical): No    Lack of Transportation (Non-Medical): No  Physical Activity: Inactive (08/19/2022)   Exercise Vital Sign    Days of Exercise per Week: 0 days    Minutes of Exercise per Session: 0 min  Stress: No Stress Concern Present (08/19/2022)   [employer] of Occupational Health - Occupational Stress Questionnaire    Feeling of Stress : Not at all  Social Connections: Not on file    Tobacco Counseling Counseling given: Not Answered Tobacco comments: Smokes 1-3 cigars/ black cigarettes a week. Agreed to referral to [place] quit line. Referral faxed. Patient given cessation information   Clinical Intake:  Pre-visit preparation completed: Yes  Pain : No/denies pain     Nutritional Status: BMI > 30  Obese Nutritional Risks: None Diabetes: Yes  How often do you need to have someone help you when you read instructions, pamphlets, or other written materials from your doctor or pharmacy?: 1 - Never  Diabetic? Yes Nutrition Risk Assessment:  Has the patient had any N/V/D within the last 2 months?   No  Does the patient have any non-healing wounds?  No  Has the patient had any unintentional weight loss or weight gain?  No   Diabetes:  Is the patient diabetic?  Yes  If diabetic, was a CBG obtained today?  No  Did the patient bring in their glucometer from home?  No  How often do you monitor your CBG's? Once weekly.   Financial Strains and Diabetes Management:  Are you having any financial strains with the device, your supplies or your medication? No .  Does the patient want to be seen by Chronic Care Management for management of their diabetes?  No  Would the patient like to be referred to a Nutritionist or for Diabetic Management?  No   Diabetic Exams:  Diabetic Eye Exam: Completed 07/21/2021 Diabetic Foot Exam: Completed 05/03/2022   Interpreter Needed?: No  Information entered by :: NAllen LPN   Activities of Daily Living    08/19/2022   10:43 AM  In your present state of health, do you have any difficulty performing the following activities:  Hearing? 1  Vision? 0  Difficulty concentrating or making decisions? 0  Walking or climbing stairs? 1  Dressing or bathing? 0  Doing errands, shopping? 0  Preparing Food and eating ? N  Using the Toilet? N  In the past six months, have you accidently leaked urine? Y  Do you have problems with loss of bowel control? Y  Managing your Medications? N  Managing your Finances? N  Housekeeping or managing your Housekeeping? N    Patient Care Team: Hoy Register, MD as PCP - General (Family Medicine) Jake Bathe, MD as PCP - Cardiology (Cardiology) Jena Gauss Gerrit Friends, MD as Consulting Physician (Gastroenterology)  Indicate any recent Medical Services you may have received from other than [facility] providers in the past year (date may be approximate).     Assessment:   This is a routine wellness examination for Deborah Coffey.  Hearing/Vision screen Vision  Screening - Comments:: Regular eye exams, Dr. Nile Riggs  Dietary issues and  exercise activities discussed: Current Exercise Habits: The patient does not participate in regular exercise at present   Goals Addressed             This Visit's Progress    Patient Stated       08/19/2022, wants to start silver sneakers       Depression Screen    08/19/2022   10:43 AM 05/03/2022    2:36 PM 02/04/2022    9:58 AM 01/20/2022    2:07 PM 09/04/2021    1:00 PM 06/01/2021    3:07 PM 04/27/2021    1:43 PM  PHQ 2/9 Scores  PHQ - 2 Score 0  1 0 0 0 0  PHQ- 9 Score   4  0  0  Exception Documentation  Patient refusal         Fall Risk    08/19/2022   10:42 AM 05/03/2022    2:36 PM 02/04/2022   11:43 AM 01/20/2022    2:08 PM 09/04/2021   12:54 PM  Fall Risk   Falls in the past year? 0 0 1 0 1  Number falls in past yr: 0 0 0 0 0  Injury with Fall? 0 0 0 0 1  Risk for fall due to : Medication side effect  Impaired balance/gait;Impaired mobility;History of fall(s) No Fall Risks History of fall(s)  Follow up Falls prevention discussed;Education provided;Falls evaluation completed  Falls evaluation completed  Education provided;Falls prevention discussed    FALL RISK PREVENTION PERTAINING TO THE HOME:  Any stairs in or around the home? No  If so, are there any without handrails? N/a Home free of loose throw rugs in walkways, pet beds, electrical cords, etc? Yes  Adequate lighting in your home to reduce risk of falls? Yes   ASSISTIVE DEVICES UTILIZED TO PREVENT FALLS:  Life alert? No  Use of a cane, walker or w/c? No  Grab bars in the bathroom? Yes  Shower chair or bench in shower? No  Elevated toilet seat or a handicapped toilet? No   TIMED UP AND GO:  Was the test performed? No .      Cognitive Function:    06/01/2021    3:09 PM  MMSE - Mini Mental State Exam  Orientation to time 5  Orientation to Place 5  Registration 3  Attention/ Calculation 5  Recall 3  Language- name 2 objects 2  Language- repeat 1  Language- follow 3 step command 3  Language-  read & follow direction 1  Write a sentence 1  Copy design 1  Total score 30        08/19/2022   10:44 AM  6CIT Screen  What Year? 0 points  What month? 0 points  What time? 0 points  Count back from 20 0 points  Months in reverse 0 points  Repeat phrase 2 points  Total Score 2 points    Immunizations Immunization History  Administered Date(s) Administered   Influenza,inj,Quad PF,6+ Mos 03/28/2015, 02/18/2019, 04/27/2021, 01/20/2022   PNEUMOCOCCAL CONJUGATE-20 04/27/2021   Pneumococcal Polysaccharide-23 01/31/2018, 06/23/2018   Zoster Recombinat (Shingrix) 06/13/2019    TDAP status: Due, Education has been provided regarding the importance of this vaccine. Advised may receive this vaccine at local pharmacy or Health Dept. Aware to provide a copy of the vaccination record if obtained from local pharmacy or Health Dept. Verbalized acceptance and understanding.  Flu Vaccine status: Up  to date  Pneumococcal vaccine status: Up to date  Covid-19 vaccine status: Declined, Education has been provided regarding the importance of this vaccine but patient still declined. Advised may receive this vaccine at local pharmacy or Health Dept.or vaccine clinic. Aware to provide a copy of the vaccination record if obtained from local pharmacy or Health Dept. Verbalized acceptance and understanding.  Qualifies for Shingles Vaccine? Yes   Zostavax completed No   Shingrix Completed?: needs second dose  Screening Tests Health Maintenance  Topic Date Due   COVID-19 Vaccine (1) Never done   DTaP/Tdap/Td (1 - Tdap) Never done   Diabetic kidney evaluation - Urine ACR  07/14/2017   Zoster Vaccines- Shingrix (2 of 2) 08/08/2019   DEXA SCAN  Never done   Medicare Annual Wellness (AWV)  06/01/2022   OPHTHALMOLOGY EXAM  07/22/2022   HEMOGLOBIN A1C  11/01/2022   INFLUENZA VACCINE  11/18/2022   Diabetic kidney evaluation - eGFR measurement  01/08/2023   FOOT EXAM  05/04/2023   MAMMOGRAM  05/27/2023    Lung Cancer Screening  06/23/2023   PAP SMEAR-Modifier  06/01/2024   COLONOSCOPY (Pts 45-65yrs Insurance coverage will need to be confirmed)  11/25/2026   Pneumonia Vaccine 69+ Years old  Completed   Hepatitis C Screening  Completed   HIV Screening  Completed   HPV VACCINES  Aged Out    Health Maintenance  Health Maintenance Due  Topic Date Due   COVID-19 Vaccine (1) Never done   DTaP/Tdap/Td (1 - Tdap) Never done   Diabetic kidney evaluation - Urine ACR  07/14/2017   Zoster Vaccines- Shingrix (2 of 2) 08/08/2019   DEXA SCAN  Never done   Medicare Annual Wellness (AWV)  06/01/2022   OPHTHALMOLOGY EXAM  07/22/2022    Colorectal cancer screening: Type of screening: Colonoscopy. Completed 11/24/2016. Repeat every 10 years  Mammogram status: scheduled for 10/18/2022  Bone Density status: scheduled for 10/18/2022  Lung Cancer Screening: (Low Dose CT Chest recommended if Age 21-80 years, 30 pack-year currently smoking OR have quit w/in 15years.) does not qualify.   Lung Cancer Screening Referral: no  Additional Screening:  Hepatitis C Screening: does qualify; Completed 02/10/2018  Vision Screening: Recommended annual ophthalmology exams for early detection of glaucoma and other disorders of the eye. Is the patient up to date with their annual eye exam?  Yes  Who is the provider or what is the name of the office in which the patient attends annual eye exams? Dr. Nile Riggs If pt is not established with a provider, would they like to be referred to a provider to establish care? No .   Dental Screening: Recommended annual dental exams for proper oral hygiene  Community Resource Referral / Chronic Care Management: CRR required this visit?  No   CCM required this visit?  No      Plan:     I have personally reviewed and noted the following in the patient's chart:   Medical and social history Use of alcohol, tobacco or illicit drugs  Current medications and supplements including  opioid prescriptions. Patient is not currently taking opioid prescriptions. Functional ability and status Nutritional status Physical activity Advanced directives List of other physicians Hospitalizations, surgeries, and ER visits in previous 12 months Vitals Screenings to include cognitive, depression, and falls Referrals and appointments  In addition, I have reviewed and discussed with patient certain preventive protocols, quality metrics, and best practice recommendations. A written personalized care plan for preventive services as well as general  preventive health recommendations were provided to patient.     Barb Merino, LPN   04/24/1094   Nurse Notes: none  Due to this being a virtual visit, the after visit summary with patients personalized plan was offered to patient via mail or my-chart. to pick up at office at next visit

## 2022-08-19 NOTE — Patient Instructions (Signed)
Deborah Coffey , Thank you for taking time to come for your Medicare Wellness Visit. I appreciate your ongoing commitment to your health goals. Please review the following plan we discussed and let me know if I can assist you in the future.   These are the goals we discussed:  Goals      Patient Stated     08/19/2022, wants to start silver sneakers        This is a list of the screening recommended for you and due dates:  Health Maintenance  Topic Date Due   COVID-19 Vaccine (1) Never done   DTaP/Tdap/Td vaccine (1 - Tdap) Never done   Yearly kidney health urinalysis for diabetes  07/14/2017   Zoster (Shingles) Vaccine (2 of 2) 08/08/2019   DEXA scan (bone density measurement)  Never done   Eye exam for diabetics  07/22/2022   Hemoglobin A1C  11/01/2022   Flu Shot  11/18/2022   Yearly kidney function blood test for diabetes  01/08/2023   Complete foot exam   05/04/2023   Mammogram  05/27/2023   Screening for Lung Cancer  06/23/2023   Medicare Annual Wellness Visit  08/19/2023   Pap Smear  06/01/2024   Colon Cancer Screening  11/25/2026   Pneumonia Vaccine  Completed   Hepatitis C Screening: USPSTF Recommendation to screen - Ages 18-79 yo.  Completed   HIV Screening  Completed   HPV Vaccine  Aged Out    Advanced directives: Advance directive discussed with you today.   Conditions/risks identified: none  Next appointment: Follow up in one year for your annual wellness visit    Preventive Care 65 Years and Older, Female Preventive care refers to lifestyle choices and visits with your health care provider that can promote health and wellness. What does preventive care include? A yearly physical exam. This is also called an annual well check. Dental exams once or twice a year. Routine eye exams. Ask your health care provider how often you should have your eyes checked. Personal lifestyle choices, including: Daily care of your teeth and gums. Regular physical  activity. Eating a healthy diet. Avoiding tobacco and drug use. Limiting alcohol use. Practicing safe sex. Taking low-dose aspirin every day. Taking vitamin and mineral supplements as recommended by your health care provider. What happens during an annual well check? The services and screenings done by your health care provider during your annual well check will depend on your age, overall health, lifestyle risk factors, and family history of disease. Counseling  Your health care provider may ask you questions about your: Alcohol use. Tobacco use. Drug use. Emotional well-being. Home and relationship well-being. Sexual activity. Eating habits. History of falls. Memory and ability to understand (cognition). Work and work Astronomer. Reproductive health. Screening  You may have the following tests or measurements: Height, weight, and BMI. Blood pressure. Lipid and cholesterol levels. These may be checked every 5 years, or more frequently if you are over 57 years old. Skin check. Lung cancer screening. You may have this screening every year starting at age 71 if you have a 30-pack-year history of smoking and currently smoke or have quit within the past 15 years. Fecal occult blood test (FOBT) of the stool. You may have this test every year starting at age 78. Flexible sigmoidoscopy or colonoscopy. You may have a sigmoidoscopy every 5 years or a colonoscopy every 10 years starting at age 65. Hepatitis C blood test. Hepatitis B blood test. Sexually transmitted disease (STD) testing.  Diabetes screening. This is done by checking your blood sugar (glucose) after you have not eaten for a while (fasting). You may have this done every 1-3 years. Bone density scan. This is done to screen for osteoporosis. You may have this done starting at age 28. Mammogram. This may be done every 1-2 years. Talk to your health care provider about how often you should have regular mammograms. Talk with your  health care provider about your test results, treatment options, and if necessary, the need for more tests. Vaccines  Your health care provider may recommend certain vaccines, such as: Influenza vaccine. This is recommended every year. Tetanus, diphtheria, and acellular pertussis (Tdap, Td) vaccine. You may need a Td booster every 10 years. Zoster vaccine. You may need this after age 46. Pneumococcal 13-valent conjugate (PCV13) vaccine. One dose is recommended after age 18. Pneumococcal polysaccharide (PPSV23) vaccine. One dose is recommended after age 60. Talk to your health care provider about which screenings and vaccines you need and how often you need them. This information is not intended to replace advice given to you by your health care provider. Make sure you discuss any questions you have with your health care provider. Document Released: 05/02/2015 Document Revised: 12/24/2015 Document Reviewed: 02/04/2015 Elsevier Interactive Patient Education  2017 [employer] Prevention in the Home Falls can cause injuries. They can happen to people of all ages. There are many things you can do to make your home safe and to help prevent falls. What can I do on the outside of my home? Regularly fix the edges of walkways and driveways and fix any cracks. Remove anything that might make you trip as you walk through a door, such as a raised step or threshold. Trim any bushes or trees on the path to your home. Use bright outdoor lighting. Clear any walking paths of anything that might make someone trip, such as rocks or tools. Regularly check to see if handrails are loose or broken. Make sure that both sides of any steps have handrails. Any raised decks and porches should have guardrails on the edges. Have any leaves, snow, or ice cleared regularly. Use sand or salt on walking paths during winter. Clean up any spills in your garage right away. This includes oil or grease spills. What can I  do in the bathroom? Use night lights. Install grab bars by the toilet and in the tub and shower. Do not use towel bars as grab bars. Use non-skid mats or decals in the tub or shower. If you need to sit down in the shower, use a plastic, non-slip stool. Keep the floor dry. Clean up any water that spills on the floor as soon as it happens. Remove soap buildup in the tub or shower regularly. Attach bath mats securely with double-sided non-slip rug tape. Do not have throw rugs and other things on the floor that can make you trip. What can I do in the bedroom? Use night lights. Make sure that you have a light by your bed that is easy to reach. Do not use any sheets or blankets that are too big for your bed. They should not hang down onto the floor. Have a firm chair that has side arms. You can use this for support while you get dressed. Do not have throw rugs and other things on the floor that can make you trip. What can I do in the kitchen? Clean up any spills right away. Avoid walking on wet floors.  Keep items that you use a lot in easy-to-reach places. If you need to reach something above you, use a strong step stool that has a grab bar. Keep electrical cords out of the way. Do not use floor polish or wax that makes floors slippery. If you must use wax, use non-skid floor wax. Do not have throw rugs and other things on the floor that can make you trip. What can I do with my stairs? Do not leave any items on the stairs. Make sure that there are handrails on both sides of the stairs and use them. Fix handrails that are broken or loose. Make sure that handrails are as long as the stairways. Check any carpeting to make sure that it is firmly attached to the stairs. Fix any carpet that is loose or worn. Avoid having throw rugs at the top or bottom of the stairs. If you do have throw rugs, attach them to the floor with carpet tape. Make sure that you have a light switch at the top of the stairs  and the bottom of the stairs. If you do not have them, ask someone to add them for you. What else can I do to help prevent falls? Wear shoes that: Do not have high heels. Have rubber bottoms. Are comfortable and fit you well. Are closed at the toe. Do not wear sandals. If you use a stepladder: Make sure that it is fully opened. Do not climb a closed stepladder. Make sure that both sides of the stepladder are locked into place. Ask someone to hold it for you, if possible. Clearly mark and make sure that you can see: Any grab bars or handrails. First and last steps. Where the edge of each step is. Use tools that help you move around (mobility aids) if they are needed. These include: Canes. Walkers. Scooters. Crutches. Turn on the lights when you go into a dark area. Replace any light bulbs as soon as they burn out. Set up your furniture so you have a clear path. Avoid moving your furniture around. If any of your floors are uneven, fix them. If there are any pets around you, be aware of where they are. Review your medicines with your doctor. Some medicines can make you feel dizzy. This can increase your chance of falling. Ask your doctor what other things that you can do to help prevent falls. This information is not intended to replace advice given to you by your health care provider. Make sure you discuss any questions you have with your health care provider. Document Released: 01/30/2009 Document Revised: 09/11/2015 Document Reviewed: 05/10/2014 Elsevier Interactive Patient Education  2017 [employer].

## 2022-10-18 ENCOUNTER — Ambulatory Visit
Admission: RE | Admit: 2022-10-18 | Discharge: 2022-10-18 | Disposition: A | Discharge status: Home / Self Care | Payer: Medicare PPO | Source: Ambulatory Visit | Attending: Family Medicine | Admitting: Family Medicine

## 2022-10-18 DIAGNOSIS — N958 Other specified menopausal and perimenopausal disorders: Secondary | ICD-10-CM | POA: Diagnosis not present

## 2022-10-18 DIAGNOSIS — Z1231 Encounter for screening mammogram for malignant neoplasm of breast: Secondary | ICD-10-CM

## 2022-10-18 DIAGNOSIS — E2839 Other primary ovarian failure: Secondary | ICD-10-CM

## 2022-10-18 DIAGNOSIS — E349 Endocrine disorder, unspecified: Secondary | ICD-10-CM | POA: Diagnosis not present

## 2022-10-20 DIAGNOSIS — I272 Pulmonary hypertension, unspecified: Secondary | ICD-10-CM | POA: Diagnosis not present

## 2022-10-20 DIAGNOSIS — I2510 Atherosclerotic heart disease of native coronary artery without angina pectoris: Secondary | ICD-10-CM | POA: Diagnosis not present

## 2022-10-20 DIAGNOSIS — I110 Hypertensive heart disease with heart failure: Secondary | ICD-10-CM | POA: Diagnosis not present

## 2022-10-20 DIAGNOSIS — E1140 Type 2 diabetes mellitus with diabetic neuropathy, unspecified: Secondary | ICD-10-CM | POA: Diagnosis not present

## 2022-10-20 DIAGNOSIS — I509 Heart failure, unspecified: Secondary | ICD-10-CM | POA: Diagnosis not present

## 2022-10-20 DIAGNOSIS — E785 Hyperlipidemia, unspecified: Secondary | ICD-10-CM | POA: Diagnosis not present

## 2022-10-20 DIAGNOSIS — F321 Major depressive disorder, single episode, moderate: Secondary | ICD-10-CM | POA: Diagnosis not present

## 2022-10-20 DIAGNOSIS — Z8249 Family history of ischemic heart disease and other diseases of the circulatory system: Secondary | ICD-10-CM | POA: Diagnosis not present

## 2022-10-22 ENCOUNTER — Other Ambulatory Visit: Payer: Self-pay | Admitting: Family Medicine

## 2022-10-22 DIAGNOSIS — R928 Other abnormal and inconclusive findings on diagnostic imaging of breast: Secondary | ICD-10-CM

## 2022-11-02 ENCOUNTER — Encounter: Payer: Self-pay | Admitting: Family Medicine

## 2022-11-02 ENCOUNTER — Ambulatory Visit: Payer: Medicare PPO | Attending: Family Medicine | Admitting: Family Medicine

## 2022-11-02 VITALS — BP 120/77 | HR 63 | Ht 65.0 in | Wt 237.2 lb

## 2022-11-02 DIAGNOSIS — Z794 Long term (current) use of insulin: Secondary | ICD-10-CM

## 2022-11-02 DIAGNOSIS — I5032 Chronic diastolic (congestive) heart failure: Secondary | ICD-10-CM

## 2022-11-02 DIAGNOSIS — Z6839 Body mass index (BMI) 39.0-39.9, adult: Secondary | ICD-10-CM

## 2022-11-02 DIAGNOSIS — Z7984 Long term (current) use of oral hypoglycemic drugs: Secondary | ICD-10-CM | POA: Diagnosis not present

## 2022-11-02 DIAGNOSIS — E1159 Type 2 diabetes mellitus with other circulatory complications: Secondary | ICD-10-CM | POA: Diagnosis not present

## 2022-11-02 DIAGNOSIS — I10 Essential (primary) hypertension: Secondary | ICD-10-CM | POA: Diagnosis not present

## 2022-11-02 DIAGNOSIS — E1169 Type 2 diabetes mellitus with other specified complication: Secondary | ICD-10-CM | POA: Diagnosis not present

## 2022-11-02 DIAGNOSIS — E785 Hyperlipidemia, unspecified: Secondary | ICD-10-CM | POA: Diagnosis not present

## 2022-11-02 DIAGNOSIS — I129 Hypertensive chronic kidney disease with stage 1 through stage 4 chronic kidney disease, or unspecified chronic kidney disease: Secondary | ICD-10-CM | POA: Diagnosis not present

## 2022-11-02 DIAGNOSIS — E1142 Type 2 diabetes mellitus with diabetic polyneuropathy: Secondary | ICD-10-CM | POA: Diagnosis not present

## 2022-11-02 DIAGNOSIS — E1122 Type 2 diabetes mellitus with diabetic chronic kidney disease: Secondary | ICD-10-CM | POA: Diagnosis not present

## 2022-11-02 DIAGNOSIS — R911 Solitary pulmonary nodule: Secondary | ICD-10-CM

## 2022-11-02 DIAGNOSIS — E669 Obesity, unspecified: Secondary | ICD-10-CM

## 2022-11-02 LAB — POCT GLYCOSYLATED HEMOGLOBIN (HGB A1C): HbA1c, POC (controlled diabetic range): 8.1 % — AB (ref 0.0–7.0)

## 2022-11-02 LAB — GLUCOSE, POCT (MANUAL RESULT ENTRY): POC Glucose: 86 mg/dl (ref 70–99)

## 2022-11-02 MED ORDER — DAPAGLIFLOZIN PROPANEDIOL 10 MG PO TABS
10.0000 mg | ORAL_TABLET | Freq: Every day | ORAL | 1 refills | Status: DC
Start: 2022-11-02 — End: 2022-11-26

## 2022-11-02 MED ORDER — TOUJEO MAX SOLOSTAR 300 UNIT/ML ~~LOC~~ SOPN
56.0000 [IU] | PEN_INJECTOR | Freq: Every day | SUBCUTANEOUS | 6 refills | Status: DC
Start: 2022-11-02 — End: 2022-11-26

## 2022-11-02 MED ORDER — METFORMIN HCL ER 500 MG PO TB24
1000.0000 mg | ORAL_TABLET | Freq: Two times a day (BID) | ORAL | 1 refills | Status: DC
Start: 2022-11-02 — End: 2022-11-26

## 2022-11-02 MED ORDER — OZEMPIC (0.25 OR 0.5 MG/DOSE) 2 MG/1.5ML ~~LOC~~ SOPN: 0.2500 mg | PEN_INJECTOR | SUBCUTANEOUS | 6 refills | Status: DC

## 2022-11-02 MED ORDER — ATORVASTATIN CALCIUM 10 MG PO TABS
10.0000 mg | ORAL_TABLET | Freq: Every day | ORAL | 1 refills | Status: DC
Start: 2022-11-02 — End: 2022-11-26

## 2022-11-02 MED ORDER — PANTOPRAZOLE SODIUM 40 MG PO TBEC
40.0000 mg | DELAYED_RELEASE_TABLET | Freq: Two times a day (BID) | ORAL | 1 refills | Status: DC
Start: 2022-11-02 — End: 2022-11-26

## 2022-11-02 MED ORDER — FUROSEMIDE 40 MG PO TABS: 40.0000 mg | ORAL_TABLET | Freq: Two times a day (BID) | ORAL | 1 refills | Status: DC

## 2022-11-02 MED ORDER — METOPROLOL TARTRATE 50 MG PO TABS
50.0000 mg | ORAL_TABLET | Freq: Two times a day (BID) | ORAL | 1 refills | Status: DC
Start: 2022-11-02 — End: 2022-11-26

## 2022-11-02 MED ORDER — LISINOPRIL 10 MG PO TABS
10.0000 mg | ORAL_TABLET | Freq: Every day | ORAL | 1 refills | Status: DC
Start: 2022-11-02 — End: 2022-11-26

## 2022-11-02 MED ORDER — GABAPENTIN 300 MG PO CAPS
ORAL_CAPSULE | ORAL | 1 refills | Status: DC
Start: 2022-11-02 — End: 2023-02-02

## 2022-11-02 NOTE — Progress Notes (Signed)
Subjective:  Patient ID: Deborah Coffey, female    DOB: 01-15-1956  Age: 67 y.o. MRN: [id number]  CC: Diabetes   HPI Deborah Coffey is a 67 y.o. year old female with a history of  Type 2 DM (A1c 8.1), Diabetic neuropathy, Hypertension, CAD (s/p CABG), pulmonary hypertension with RV strain. here for a follow-up visit    Interval History: Discussed the use of AI scribe software for clinical note transcription with the patient, who gave verbal consent to proceed.  She presents for a follow-up visit. She reports not having seen the lung specialist as previously referred due to misunderstanding the purpose of the referral. She expresses a fear of chemotherapy and a preference not to pursue it if diagnosed with cancer. She agrees to a new referral to the lung specialist for evaluation of the lung nodule. CT chest from 06/2022 revealed: IMPRESSION: 1. Solid pulmonary nodules which were new on prior exam have resolved, are stable, or decreased in size. However, a new solid pulmonary nodule is seen in the right upper lobe measuring 5.4 mm. Lung-RADS 3, probably benign findings. Short-term follow-up in 6 months is recommended with repeat low-dose chest CT without contrast (please use the following order, "CT CHEST LCS NODULE FOLLOW-UP W/O CM"). 2. 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 3. Aortic Atherosclerosis (ICD10-I70.0) and Emphysema (ICD10-J43.9).     Regarding her diabetes, she admits to recent dietary indiscretions, including cake and cupcakes, which may have contributed to an increase in her A1c from 6.9 to 8.1. She is currently on Farxiga, metformin, and Toujeo for diabetes management. She reports not regularly checking her blood glucose at home. She has started exercising at [employer] and expresses a desire for medication to assist with weight  loss.  The patient also mentions a need for Pantoprazole for gas and indigestion. She reports taking furosemide, metoprolol, and lisinopril for heart health and gabapentin for neuropathy. She has had a recent eye exam.        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 competitive flow   TIA (transient ischemic attack)    Type II diabetes mellitus (HCC)     Past Surgical History:  Procedure Laterality Date   CARDIAC CATHETERIZATION  2010   Previous LAD 95% lesion - now ~30-40%; patent LIMA with competitive flow   CESAREAN SECTION  1977; 1989   CHOLECYSTECTOMY  ~ 2012   CORONARY ARTERY BYPASS GRAFT  02/2004   "CABG X1" (03/22/2013); LIMA-LAD   RIGHT/LEFT HEART CATH AND CORONARY/GRAFT ANGIOGRAPHY N/A 04/16/2021   Procedure: RIGHT/LEFT HEART CATH AND CORONARY/GRAFT ANGIOGRAPHY;  Surgeon: Corky Crafts, MD;  Location: [facility];  Service: Cardiovascular;  Laterality: N/A;   TUBAL LIGATION  1989    Family History  Problem Relation Age of Onset   Heart  disease Mother    Diabetes Mother    Cancer Mother        cervical   Diabetes Father    Diabetes Sister    Heart disease Sister    Lupus Sister    Heart failure Sister    Hepatitis C Sister    Hypertension Daughter    Diabetes Brother    Heart attack Brother    Heart failure Brother    Diabetes Brother    Colon cancer Neg Hx    Breast cancer Neg Hx     Social History   Socioeconomic History   Marital status: Divorced     Spouse name: Not on file   Number of children: Not on file   Years of education: 12   Highest education level: Some college, no degree  Occupational History   Occupation: Disabled  Tobacco Use   Smoking status: Former    Current packs/day: 0.25    Average packs/day: 0.3 packs/day for 42.0 years (10.5 ttl pk-yrs)    Types: Cigarettes   Smokeless tobacco: Never   Tobacco comments:    Smokes 1-3 cigars/ black cigarettes a week. Agreed to referral to [facility] quit line. Referral faxed. Patient given cessation information  Vaping Use   Vaping status: Never Used  Substance and Sexual Activity   Alcohol use: Not Currently   Drug use: No    Types: Marijuana    Comment: smoked pot years ago but not currently   Sexual activity: Not Currently  Other Topics Concern   Not on file  Social History Narrative   Lives with daughter and 3 grandchildren   Social Determinants of Health   Financial Resource Strain: Low Risk  (08/19/2022)   Overall Financial Resource Strain (CARDIA)    Difficulty of Paying Living Expenses: Not hard at all  Food Insecurity: No Food Insecurity (08/19/2022)   Hunger Vital Sign    Worried About Running Out of Food in the Last Year: Never true    Ran Out of Food in the Last Year: Never true  Transportation Needs: No Transportation Needs (08/19/2022)   PRAPARE - Administrator, Civil Service (Medical): No    Lack of Transportation (Non-Medical): No  Physical Activity: Inactive (08/19/2022)   Exercise Vital Sign    Days of Exercise per Week: 0 days    Minutes of Exercise per Session: 0 min  Stress: No Stress Concern Present (08/19/2022)   [employer] of Occupational Health - Occupational Stress Questionnaire    Feeling of Stress : Not at all  Social Connections: Not on file    Allergies  Allergen Reactions   Ciprofloxacin Rash    Outpatient Medications Prior to Visit  Medication Sig Dispense Refill   acetaminophen (TYLENOL) 500 MG tablet Take 1,000 mg  by mouth every 6 (six) hours as needed for headache (pain).      Alcohol Swabs (B-D SINGLE USE SWABS REGULAR) PADS USE AS DIRECTED 100 each 0   aspirin EC 81 MG tablet Take 81 mg by mouth daily.     Blood Glucose Monitoring Suppl (ONETOUCH VERIO) w/Device KIT Use as instructed to test blood glucose 3 times daily. 1 kit 0   clobetasol cream (TEMOVATE) 0.05 % Apply topically 2 (two) times daily. (Patient taking differently: Apply 1 Application topically daily as needed (Rash).) 60 g 0   Continuous Blood Gluc Sensor (DEXCOM G6 SENSOR) MISC Use to check blood sugar three times daily. Dx. E11.59 3 each 3  diclofenac Sodium (VOLTAREN) 1 % GEL Apply 2 g topically daily as needed (muscle pain).     glucose blood (ONETOUCH VERIO) test strip Use as instructed to test blood glucose 3 times daily. 100 each 3   Insulin Pen Needle (DROPLET PEN NEEDLES) 31G X 8 MM MISC USE WITH TOUJEO SOLOSTAR PEN EVERY DAY 100 each 0   Lancets (ONETOUCH DELICA PLUS LANCET33G) MISC Use as instructed to test blood glucose 3 times daily. 100 each 3   Multiple Vitamin (MULTIVITAMIN WITH MINERALS) TABS tablet Take 1 tablet by mouth daily.     nitroGLYCERIN (NITROSTAT) 0.4 MG SL tablet Place 1 tablet (0.4 mg total) under the tongue every 5 (five) minutes as needed for chest pain. 25 tablet 3   atorvastatin (LIPITOR) 10 MG tablet Take 1 tablet (10 mg total) by mouth once daily. 90 tablet 1   dapagliflozin propanediol (FARXIGA) 5 MG TABS tablet Take 1 tablet (5 mg total) by mouth daily before breakfast. 90 tablet 1   furosemide (LASIX) 20 MG tablet Take 2 tablets (40 mg total) by mouth 2 (two) times daily. 360 tablet 0   gabapentin (NEURONTIN) 300 MG capsule Take orally 2 caps (600 mg) in the morning and 3 capsules (900 mg) in the evening 450 capsule 1   insulin glargine, 2 Unit Dial, (TOUJEO MAX SOLOSTAR) 300 UNIT/ML Solostar Pen Inject 56 Units into the skin daily. 6 mL 6   lisinopril (ZESTRIL) 10 MG tablet Take 1 tablet (10 mg total)  by mouth daily. 90 tablet 1   metFORMIN (GLUCOPHAGE-XR) 500 MG 24 hr tablet Take 2 tablets (1,000 mg total) by mouth 2 (two) times daily. 360 tablet 1   metoprolol tartrate (LOPRESSOR) 50 MG tablet Take 1 tablet (50 mg total) by mouth 2 (two) times daily. 180 tablet 1   pantoprazole (PROTONIX) 40 MG tablet Take 1 tablet (40 mg total) by mouth 2 (two) times daily. 180 tablet 0   No facility-administered medications prior to visit.     ROS Review of Systems  Constitutional:  Negative for activity change and appetite change.  HENT:  Negative for sinus pressure and sore throat.   Respiratory:  Negative for chest tightness, shortness of breath and wheezing.   Cardiovascular:  Negative for chest pain and palpitations.  Gastrointestinal:  Negative for abdominal distention, abdominal pain and constipation.  Genitourinary: Negative.   Musculoskeletal: Negative.   Psychiatric/Behavioral:  Negative for behavioral problems and dysphoric mood.     Objective:  BP 120/77   Pulse 63   Ht 5\' 5"  (1.651 m)   Wt 237 lb 3.2 oz (107.6 kg)   LMP  (LMP Unknown)   SpO2 97%   BMI 39.47 kg/m      11/02/2022    2:16 PM 08/19/2022   10:32 AM 05/03/2022    2:34 PM  BP/Weight  Systolic BP 120 -- 123  Diastolic BP 77 -- 78  Wt. (Lbs) 237.2 233 235.6  BMI 39.47 kg/m2 38.77 kg/m2 39.21 kg/m2      Physical Exam Constitutional:      Appearance: She is well-developed. She is obese.  Cardiovascular:     Rate and Rhythm: Normal rate.     Heart sounds: Normal heart sounds. No murmur heard. Pulmonary:     Effort: Pulmonary effort is normal.     Breath sounds: Normal breath sounds. No wheezing or rales.  Chest:     Chest wall: No tenderness.  Abdominal:     General: Bowel sounds are  normal. There is no distension.     Palpations: Abdomen is soft. There is no mass.     Tenderness: There is no abdominal tenderness.  Musculoskeletal:        General: Normal range of motion.     Right lower leg: No edema.      Left lower leg: No edema.  Neurological:     Mental Status: She is alert and oriented to person, place, and time.  Psychiatric:        Mood and Affect: Mood normal.        Latest Ref Rng & Units 01/07/2022   10:40 AM 12/28/2021   11:19 AM 12/18/2021   12:30 PM  CMP  Glucose 70 - 99 mg/dL 952  841  324   BUN 8 - 23 mg/dL 28  22  28    Creatinine 0.44 - 1.00 mg/dL 4.01  0.27  2.53   Sodium 135 - 145 mmol/L 141  139  141   Potassium 3.5 - 5.1 mmol/L 4.3  5.3  4.6   Chloride 98 - 111 mmol/L 108  106  108   CO2 22 - 32 mmol/L 25  24  25    Calcium 8.9 - 10.3 mg/dL 9.7  9.4  9.3     Lipid Panel     Component Value Date/Time   CHOL 161 01/21/2021 1618   TRIG 104 01/21/2021 1618   HDL 72 01/21/2021 1618   CHOLHDL 2.2 01/21/2021 1618   CHOLHDL 2.2 09/02/2016 0533   VLDL 13 09/02/2016 0533   LDLCALC 70 01/21/2021 1618    CBC    Component Value Date/Time   WBC 8.6 12/18/2021 1230   RBC 3.98 12/18/2021 1230   HGB 10.9 (L) 12/18/2021 1230   HGB 12.1 04/10/2021 1248   HCT 35.5 (L) 12/18/2021 1230   HCT 37.0 04/10/2021 1248   PLT 266 12/18/2021 1230   PLT 261 04/10/2021 1248   MCV 89.2 12/18/2021 1230   MCV 83 04/10/2021 1248   MCH 27.4 12/18/2021 1230   MCHC 30.7 12/18/2021 1230   RDW 16.9 (H) 12/18/2021 1230   RDW 13.7 04/10/2021 1248   LYMPHSABS 1.5 12/18/2021 1230   LYMPHSABS 1.9 01/21/2021 1618   MONOABS 0.7 12/18/2021 1230   EOSABS 0.1 12/18/2021 1230   EOSABS 0.1 01/21/2021 1618   BASOSABS 0.0 12/18/2021 1230   BASOSABS 0.0 01/21/2021 1618    Lab Results  Component Value Date   HGBA1C 8.1 (A) 11/02/2022    Assessment & Plan:      Lung Nodule:  -Patient did not attend initial referral to pulmonology for evaluation of lung nodule. Discussed the importance of evaluation to rule out malignancy. -Referred to pulmonology for evaluation.  Type 2 Diabetes Mellitus:  -Uncontrolled -A1c increased from 6.9 to 8.1, likely secondary to dietary indiscretions.  Patient is on Farxiga, Metformin, and Toujeo. -Increase Farxiga from 5mg  to 10mg  daily. -Add Ozempic for weight loss and improved glycemic control. -Encourage dietary modifications and continue exercise. -Check A1c in 3 months.  Gastroesophageal Reflux Disease: - Patient reports frequent gas and is on Pantoprazole. -Continue Pantoprazole.  Hypertension:  -Well controlled on current regimen of Metoprolol and Lisinopril. -Continue current regimen.  Hyperlipidemia: - Patient is on a statin. -Continue current regimen.  General Health Maintenance: -Encourage patient to complete Shingles vaccination at pharmacy. -Continue monitoring eye health with regular exams. -Check CMet and microalbumin level today. -Follow-up in 3 months.           Meds  ordered this encounter  Medications   dapagliflozin propanediol (FARXIGA) 10 MG TABS tablet    Sig: Take 1 tablet (10 mg total) by mouth daily before breakfast.    Dispense:  90 tablet    Refill:  1    Dose increase   Semaglutide,0.25 or 0.5MG /DOS, (OZEMPIC, 0.25 OR 0.5 MG/DOSE,) 2 MG/1.5ML SOPN    Sig: Inject 0.25 mg into the skin once a week.    Dispense:  2 mL    Refill:  6   atorvastatin (LIPITOR) 10 MG tablet    Sig: Take 1 tablet (10 mg total) by mouth once daily.    Dispense:  90 tablet    Refill:  1   furosemide (LASIX) 40 MG tablet    Sig: Take 1 tablet (40 mg total) by mouth 2 (two) times daily.    Dispense:  180 tablet    Refill:  1   gabapentin (NEURONTIN) 300 MG capsule    Sig: Take orally 2 caps (600 mg) in the morning and 3 capsules (900 mg) in the evening    Dispense:  450 capsule    Refill:  1   insulin glargine, 2 Unit Dial, (TOUJEO MAX SOLOSTAR) 300 UNIT/ML Solostar Pen    Sig: Inject 56 Units into the skin daily.    Dispense:  6 mL    Refill:  6   lisinopril (ZESTRIL) 10 MG tablet    Sig: Take 1 tablet (10 mg total) by mouth daily.    Dispense:  90 tablet    Refill:  1   metFORMIN (GLUCOPHAGE-XR) 500 MG  24 hr tablet    Sig: Take 2 tablets (1,000 mg total) by mouth 2 (two) times daily.    Dispense:  360 tablet    Refill:  1   metoprolol tartrate (LOPRESSOR) 50 MG tablet    Sig: Take 1 tablet (50 mg total) by mouth 2 (two) times daily.    Dispense:  180 tablet    Refill:  1   pantoprazole (PROTONIX) 40 MG tablet    Sig: Take 1 tablet (40 mg total) by mouth 2 (two) times daily.    Dispense:  180 tablet    Refill:  1    Follow-up: Return in about 3 months (around 02/02/2023) for Chronic medical conditions.       Hoy Register, MD, FAAFP. [facility] [place], [place] [phone]   11/02/2022, 6:03 PM

## 2022-11-02 NOTE — Patient Instructions (Signed)
Semaglutide Injection What is this medication? SEMAGLUTIDE (SEM a GLOO tide) treats type 2 diabetes. It works by increasing insulin levels in your body, which decreases your blood sugar (glucose). It also reduces the amount of sugar released into the blood and slows down your digestion. It can also be used to lower the risk of heart attack and stroke in people with type 2 diabetes. Changes to diet and exercise are often combined with this medication. This medicine may be used for other purposes; ask your health care provider or pharmacist if you have questions. COMMON BRAND NAME(S): OZEMPIC What should I tell my care team before I take this medication? They need to know if you have any of these conditions: Endocrine tumors (MEN 2) or if someone in your family had these tumors Eye disease, vision problems History of pancreatitis Kidney disease Stomach problems Thyroid cancer or if someone in your family had thyroid cancer An unusual or allergic reaction to semaglutide, other medications, foods, dyes, or preservatives Pregnant or trying to get pregnant Breast-feeding How should I use this medication? This medication is for injection under the skin of your upper leg (thigh), stomach area, or upper arm. It is given once every week (every 7 days). You will be taught how to prepare and give this medication. Use exactly as directed. Take your medication at regular intervals. Do not take it more often than directed. If you use this medication with insulin, you should inject this medication and the insulin separately. Do not mix them together. Do not give the injections right next to each other. Change (rotate) injection sites with each injection. It is important that you put your used needles and syringes in a special sharps container. Do not put them in a trash can. If you do not have a sharps container, call your pharmacist or care team to get one. A special MedGuide will be given to you by the  pharmacist with each prescription and refill. Be sure to read this information carefully each time. This medication comes with INSTRUCTIONS FOR USE. Ask your pharmacist for directions on how to use this medication. Read the information carefully. Talk to your pharmacist or care team if you have questions. Talk to your care team about the use of this medication in children. Special care may be needed. Overdosage: If you think you have taken too much of this medicine contact a poison control center or emergency room at once. NOTE: This medicine is only for you. Do not share this medicine with others. What if I miss a dose? If you miss a dose, take it as soon as you can within 5 days after the missed dose. Then take your next dose at your regular weekly time. If it has been longer than 5 days after the missed dose, do not take the missed dose. Take the next dose at your regular time. Do not take double or extra doses. If you have questions about a missed dose, contact your care team for advice. What may interact with this medication? Other medications for diabetes Many medications may cause changes in blood sugar, these include: Alcohol containing beverages Antiviral medications for HIV or AIDS Aspirin and aspirin-like medications Certain medications for blood pressure, heart disease, irregular heart beat Chromium Diuretics Female hormones, such as estrogens or progestins, birth control pills Fenofibrate Gemfibrozil Isoniazid Lanreotide Female hormones or anabolic steroids MAOIs like Carbex, Eldepryl, Marplan, Nardil, and Parnate Medications for weight loss Medications for allergies, asthma, cold, or cough Medications for depression,   anxiety, or psychotic disturbances Niacin Nicotine NSAIDs, medications for pain and inflammation, like ibuprofen or naproxen Octreotide Pasireotide Pentamidine Phenytoin Probenecid Quinolone antibiotics such as ciprofloxacin, levofloxacin, ofloxacin Some  herbal dietary supplements Steroid medications such as prednisone or cortisone Sulfamethoxazole; trimethoprim Thyroid hormones Some medications can hide the warning symptoms of low blood sugar (hypoglycemia). You may need to monitor your blood sugar more closely if you are taking one of these medications. These include: Beta-blockers, often used for high blood pressure or heart problems (examples include atenolol, metoprolol, propranolol) Clonidine Guanethidine Reserpine This list may not describe all possible interactions. Give your health care provider a list of all the medicines, herbs, non-prescription drugs, or dietary supplements you use. Also tell them if you smoke, drink alcohol, or use illegal drugs. Some items may interact with your medicine. What should I watch for while using this medication? Visit your care team for regular checks on your progress. Drink plenty of fluids while taking this medication. Check with your care team if you get an attack of severe diarrhea, nausea, and vomiting. The loss of too much body fluid can make it dangerous for you to take this medication. A test called the HbA1C (A1C) will be monitored. This is a simple blood test. It measures your blood sugar control over the last 2 to 3 months. You will receive this test every 3 to 6 months. Learn how to check your blood sugar. Learn the symptoms of low and high blood sugar and how to manage them. Always carry a quick-source of sugar with you in case you have symptoms of low blood sugar. Examples include hard sugar candy or glucose tablets. Make sure others know that you can choke if you eat or drink when you develop serious symptoms of low blood sugar, such as seizures or unconsciousness. They must get medical help at once. Tell your care team if you have high blood sugar. You might need to change the dose of your medication. If you are sick or exercising more than usual, you might need to change the dose of your  medication. Do not skip meals. Ask your care team if you should avoid alcohol. Many nonprescription cough and cold products contain sugar or alcohol. These can affect blood sugar. Pens should never be shared. Even if the needle is changed, sharing may result in passing of viruses like hepatitis or HIV. Wear a medical ID bracelet or chain, and carry a card that describes your disease and details of your medication and dosage times What side effects may I notice from receiving this medication? Side effects that you should report to your care team as soon as possible: Allergic reactions--skin rash, itching, hives, swelling of the face, lips, tongue, or throat Change in vision Dehydration--increased thirst, dry mouth, feeling faint or lightheaded, headache, dark yellow or brown urine Gallbladder problems--severe stomach pain, nausea, vomiting, fever Heart palpitations--rapid, pounding, or irregular heartbeat Kidney injury--decrease in the amount of urine, swelling of the ankles, hands, or feet Pancreatitis--severe stomach pain that spreads to your back or gets worse after eating or when touched, fever, nausea, vomiting Thoughts of suicide or self-harm, worsening mood, feelings of depression Thyroid cancer--new mass or lump in the neck, pain or trouble swallowing, trouble breathing, hoarseness Side effects that usually do not require medical attention (report these to your care team if they continue or are bothersome): Diarrhea Loss of appetite Nausea Upset stomach This list may not describe all possible side effects. Call your doctor for medical advice about   side effects. You may report side effects to FDA at 1-800-FDA-1088. Where should I keep my medication? Keep out of the reach of children. Store unopened pens in a refrigerator between 2 and 8 degrees C (36 and 46 degrees F). Do not freeze. Protect from light and heat. After you first use the pen, it can be stored for 56 days at room  temperature between 15 and 30 degrees C (59 and 86 degrees F) or in a refrigerator. Throw away your used pen after 56 days or after the expiration date, whichever comes first. Do not store your pen with the needle attached. If the needle is left on, medication may leak from the pen. NOTE: This sheet is a summary. It may not cover all possible information. If you have questions about this medicine, talk to your doctor, pharmacist, or health care provider.  2024 Elsevier/Gold Standard (2022-06-13 00:00:00)  

## 2022-11-03 ENCOUNTER — Other Ambulatory Visit: Payer: Self-pay | Admitting: Family Medicine

## 2022-11-03 DIAGNOSIS — R748 Abnormal levels of other serum enzymes: Secondary | ICD-10-CM

## 2022-11-03 LAB — CMP14+EGFR
ALT: 34 IU/L — ABNORMAL HIGH (ref 0–32)
AST: 37 IU/L (ref 0–40)
Albumin: 4.2 g/dL (ref 3.9–4.9)
Alkaline Phosphatase: 184 IU/L — ABNORMAL HIGH (ref 44–121)
BUN/Creatinine Ratio: 20 (ref 12–28)
BUN: 28 mg/dL — ABNORMAL HIGH (ref 8–27)
Bilirubin Total: 0.3 mg/dL (ref 0.0–1.2)
CO2: 21 mmol/L (ref 20–29)
Calcium: 10.2 mg/dL (ref 8.7–10.3)
Chloride: 103 mmol/L (ref 96–106)
Creatinine, Ser: 1.42 mg/dL — ABNORMAL HIGH (ref 0.57–1.00)
Globulin, Total: 3.2 g/dL (ref 1.5–4.5)
Glucose: 62 mg/dL — ABNORMAL LOW (ref 70–99)
Potassium: 4.8 mmol/L (ref 3.5–5.2)
Sodium: 140 mmol/L (ref 134–144)
Total Protein: 7.4 g/dL (ref 6.0–8.5)
eGFR: 41 mL/min/{1.73_m2} — ABNORMAL LOW (ref >59)

## 2022-11-03 LAB — MICROALBUMIN / CREATININE URINE RATIO
Creatinine, Urine: 37.6 mg/dL
Microalb/Creat Ratio: 136 mg/g creat — ABNORMAL HIGH (ref 0–29)
Microalbumin, Urine: 51.2 ug/mL

## 2022-11-12 ENCOUNTER — Other Ambulatory Visit: Payer: Medicare PPO

## 2022-11-19 ENCOUNTER — Other Ambulatory Visit: Payer: Self-pay | Admitting: Family Medicine

## 2022-11-19 ENCOUNTER — Other Ambulatory Visit: Payer: Medicare PPO

## 2022-11-19 ENCOUNTER — Ambulatory Visit
Admission: RE | Admit: 2022-11-19 | Discharge: 2022-11-19 | Disposition: A | Discharge status: Home / Self Care | Payer: Medicare PPO | Source: Ambulatory Visit | Attending: Family Medicine | Admitting: Family Medicine

## 2022-11-19 DIAGNOSIS — R928 Other abnormal and inconclusive findings on diagnostic imaging of breast: Secondary | ICD-10-CM

## 2022-11-19 DIAGNOSIS — N6489 Other specified disorders of breast: Secondary | ICD-10-CM

## 2022-11-26 ENCOUNTER — Other Ambulatory Visit: Payer: Self-pay

## 2022-11-26 DIAGNOSIS — I5032 Chronic diastolic (congestive) heart failure: Secondary | ICD-10-CM

## 2022-11-26 DIAGNOSIS — I129 Hypertensive chronic kidney disease with stage 1 through stage 4 chronic kidney disease, or unspecified chronic kidney disease: Secondary | ICD-10-CM

## 2022-11-26 DIAGNOSIS — I10 Essential (primary) hypertension: Secondary | ICD-10-CM

## 2022-11-26 DIAGNOSIS — E1169 Type 2 diabetes mellitus with other specified complication: Secondary | ICD-10-CM

## 2022-11-26 DIAGNOSIS — E1159 Type 2 diabetes mellitus with other circulatory complications: Secondary | ICD-10-CM

## 2022-11-26 MED ORDER — PANTOPRAZOLE SODIUM 40 MG PO TBEC
40.0000 mg | DELAYED_RELEASE_TABLET | Freq: Two times a day (BID) | ORAL | 1 refills | Status: DC
Start: 2022-11-26 — End: 2022-12-28

## 2022-11-26 MED ORDER — TOUJEO MAX SOLOSTAR 300 UNIT/ML ~~LOC~~ SOPN
56.0000 [IU] | PEN_INJECTOR | Freq: Every day | SUBCUTANEOUS | 6 refills | Status: DC
Start: 2022-11-26 — End: 2022-12-27

## 2022-11-26 MED ORDER — ATORVASTATIN CALCIUM 10 MG PO TABS
10.0000 mg | ORAL_TABLET | Freq: Every day | ORAL | 1 refills | Status: DC
Start: 2022-11-26 — End: 2022-12-27

## 2022-11-26 MED ORDER — OZEMPIC (0.25 OR 0.5 MG/DOSE) 2 MG/1.5ML ~~LOC~~ SOPN: 0.2500 mg | PEN_INJECTOR | SUBCUTANEOUS | 6 refills | Status: DC

## 2022-11-26 MED ORDER — DAPAGLIFLOZIN PROPANEDIOL 10 MG PO TABS
10.0000 mg | ORAL_TABLET | Freq: Every day | ORAL | 1 refills | Status: DC
Start: 2022-11-26 — End: 2022-12-27

## 2022-11-26 MED ORDER — LISINOPRIL 10 MG PO TABS
10.0000 mg | ORAL_TABLET | Freq: Every day | ORAL | 1 refills | Status: DC
Start: 2022-11-26 — End: 2022-12-27

## 2022-11-26 MED ORDER — METOPROLOL TARTRATE 50 MG PO TABS
50.0000 mg | ORAL_TABLET | Freq: Two times a day (BID) | ORAL | 1 refills | Status: DC
Start: 2022-11-26 — End: 2022-12-28

## 2022-11-26 MED ORDER — METFORMIN HCL ER 500 MG PO TB24
1000.0000 mg | ORAL_TABLET | Freq: Two times a day (BID) | ORAL | 1 refills | Status: DC
Start: 2022-11-26 — End: 2022-12-28

## 2022-11-26 MED ORDER — FUROSEMIDE 40 MG PO TABS: 40.0000 mg | ORAL_TABLET | Freq: Two times a day (BID) | ORAL | 1 refills | Status: DC

## 2022-12-23 ENCOUNTER — Institutional Professional Consult (permissible substitution): Payer: Medicare PPO | Admitting: Pulmonary Disease

## 2022-12-23 NOTE — Progress Notes (Deleted)
Synopsis: Referred in *** for *** by Hoy Register, MD  Subjective:   PATIENT ID: Deborah Coffey GENDER: female DOB: 1955/07/24, MRN: [id number]  No chief complaint on file.   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 competitive flow   TIA (transient ischemic attack)    Type II diabetes mellitus (HCC)      Family History  Problem Relation Age of Onset   Heart disease Mother    Diabetes Mother    Cancer Mother        cervical   Diabetes Father    Diabetes Sister    Heart disease Sister    Lupus Sister    Heart failure Sister    Hepatitis C Sister    Hypertension Daughter    Diabetes Brother    Heart attack Brother    Heart failure Brother    Diabetes Brother    Colon cancer Neg Hx    Breast cancer Neg Hx      Past Surgical History:  Procedure Laterality Date   CARDIAC CATHETERIZATION  2010   Previous LAD 95% lesion - now ~30-40%; patent LIMA with competitive flow   CESAREAN SECTION  1977; 1989   CHOLECYSTECTOMY  ~ 2012   CORONARY ARTERY BYPASS GRAFT  02/2004   "CABG X1" (03/22/2013); LIMA-LAD   RIGHT/LEFT HEART CATH AND CORONARY/GRAFT ANGIOGRAPHY N/A 04/16/2021   Procedure: RIGHT/LEFT HEART CATH AND CORONARY/GRAFT ANGIOGRAPHY;  Surgeon: Corky Crafts, MD;  Location: [facility];  Service: Cardiovascular;  Laterality: N/A;   TUBAL LIGATION  1989    Social History   Socioeconomic History   Marital status: Divorced    Spouse name: Not  on file   Number of children: Not on file   Years of education: 12   Highest education level: Some college, no degree  Occupational History   Occupation: Disabled  Tobacco Use   Smoking status: Former    Current packs/day: 0.25    Average packs/day: 0.3 packs/day for 42.0 years (10.5 ttl pk-yrs)    Types: Cigarettes   Smokeless tobacco: Never   Tobacco comments:    Smokes 1-3 cigars/ black cigarettes a week. Agreed to referral to [facility] quit line. Referral faxed. Patient given cessation information  Vaping Use   Vaping status: Never Used  Substance and Sexual Activity   Alcohol use: Not Currently   Drug use: No    Types: Marijuana    Comment: smoked pot years ago but not currently   Sexual activity: Not Currently  Other Topics Concern   Not on file  Social History Narrative   Lives with daughter and 3 grandchildren   Social Determinants of Health   Financial Resource Strain: Low Risk  (08/19/2022)   Overall Financial Resource Strain (CARDIA)    Difficulty of Paying Living Expenses: Not hard at all  Food  Insecurity: No Food Insecurity (08/19/2022)   Hunger Vital Sign    Worried About Running Out of Food in the Last Year: Never true    Ran Out of Food in the Last Year: Never true  Transportation Needs: No Transportation Needs (08/19/2022)   PRAPARE - Administrator, Civil Service (Medical): No    Lack of Transportation (Non-Medical): No  Physical Activity: Inactive (08/19/2022)   Exercise Vital Sign    Days of Exercise per Week: 0 days    Minutes of Exercise per Session: 0 min  Stress: No Stress Concern Present (08/19/2022)   [employer] of Occupational Health - Occupational Stress Questionnaire    Feeling of Stress : Not at all  Social Connections: Not on file  Intimate Partner Violence: Not on file     Allergies  Allergen Reactions   Ciprofloxacin Rash     Outpatient Medications Prior to Visit  Medication Sig Dispense Refill   acetaminophen (TYLENOL) 500  MG tablet Take 1,000 mg by mouth every 6 (six) hours as needed for headache (pain).      Alcohol Swabs (B-D SINGLE USE SWABS REGULAR) PADS USE AS DIRECTED 100 each 0   aspirin EC 81 MG tablet Take 81 mg by mouth daily.     atorvastatin (LIPITOR) 10 MG tablet Take 1 tablet (10 mg total) by mouth once daily. 90 tablet 1   Blood Glucose Monitoring Suppl (ONETOUCH VERIO) w/Device KIT Use as instructed to test blood glucose 3 times daily. 1 kit 0   clobetasol cream (TEMOVATE) 0.05 % Apply topically 2 (two) times daily. (Patient taking differently: Apply 1 Application topically daily as needed (Rash).) 60 g 0   Continuous Blood Gluc Sensor (DEXCOM G6 SENSOR) MISC Use to check blood sugar three times daily. Dx. E11.59 3 each 3   dapagliflozin propanediol (FARXIGA) 10 MG TABS tablet Take 1 tablet (10 mg total) by mouth daily before breakfast. 90 tablet 1   diclofenac Sodium (VOLTAREN) 1 % GEL Apply 2 g topically daily as needed (muscle pain).     furosemide (LASIX) 40 MG tablet Take 1 tablet (40 mg total) by mouth 2 (two) times daily. 180 tablet 1   gabapentin (NEURONTIN) 300 MG capsule Take orally 2 caps (600 mg) in the morning and 3 capsules (900 mg) in the evening 450 capsule 1   glucose blood (ONETOUCH VERIO) test strip Use as instructed to test blood glucose 3 times daily. 100 each 3   insulin glargine, 2 Unit Dial, (TOUJEO MAX SOLOSTAR) 300 UNIT/ML Solostar Pen Inject 56 Units into the skin daily. 6 mL 6   Insulin Pen Needle (DROPLET PEN NEEDLES) 31G X 8 MM MISC USE WITH TOUJEO SOLOSTAR PEN EVERY DAY 100 each 0   Lancets (ONETOUCH DELICA PLUS LANCET33G) MISC Use as instructed to test blood glucose 3 times daily. 100 each 3   lisinopril (ZESTRIL) 10 MG tablet Take 1 tablet (10 mg total) by mouth daily. 90 tablet 1   metFORMIN (GLUCOPHAGE-XR) 500 MG 24 hr tablet Take 2 tablets (1,000 mg total) by mouth 2 (two) times daily. 360 tablet 1   metoprolol tartrate (LOPRESSOR) 50 MG tablet Take 1 tablet (50 mg  total) by mouth 2 (two) times daily. 180 tablet 1   Multiple Vitamin (MULTIVITAMIN WITH MINERALS) TABS tablet Take 1 tablet by mouth daily.     nitroGLYCERIN (NITROSTAT) 0.4 MG SL tablet Place 1 tablet (0.4 mg total) under the tongue every 5 (five) minutes as needed for  chest pain. 25 tablet 3   pantoprazole (PROTONIX) 40 MG tablet Take 1 tablet (40 mg total) by mouth 2 (two) times daily. 180 tablet 1   Semaglutide,0.25 or 0.5MG /DOS, (OZEMPIC, 0.25 OR 0.5 MG/DOSE,) 2 MG/1.5ML SOPN Inject 0.25 mg into the skin once a week. 2 mL 6   No facility-administered medications prior to visit.    ROS   Objective:  Physical Exam   There were no vitals filed for this visit.   on *** LPM *** RA BMI Readings from Last 3 Encounters:  11/02/22 39.47 kg/m  08/19/22 38.77 kg/m  05/03/22 39.21 kg/m   Wt Readings from Last 3 Encounters:  11/02/22 237 lb 3.2 oz (107.6 kg)  08/19/22 233 lb (105.7 kg)  05/03/22 235 lb 9.6 oz (106.9 kg)     CBC    Component Value Date/Time   WBC 8.6 12/18/2021 1230   RBC 3.98 12/18/2021 1230   HGB 10.9 (L) 12/18/2021 1230   HGB 12.1 04/10/2021 1248   HCT 35.5 (L) 12/18/2021 1230   HCT 37.0 04/10/2021 1248   PLT 266 12/18/2021 1230   PLT 261 04/10/2021 1248   MCV 89.2 12/18/2021 1230   MCV 83 04/10/2021 1248   MCH 27.4 12/18/2021 1230   MCHC 30.7 12/18/2021 1230   RDW 16.9 (H) 12/18/2021 1230   RDW 13.7 04/10/2021 1248   LYMPHSABS 1.5 12/18/2021 1230   LYMPHSABS 1.9 01/21/2021 1618   MONOABS 0.7 12/18/2021 1230   EOSABS 0.1 12/18/2021 1230   EOSABS 0.1 01/21/2021 1618   BASOSABS 0.0 12/18/2021 1230   BASOSABS 0.0 01/21/2021 1618    ***  Chest Imaging: ***  Pulmonary Functions Testing Results:    Latest Ref Rng & Units 05/28/2021    9:53 AM  PFT Results  FVC-Pre L 1.63   FVC-Predicted Pre % 61   FVC-Post L 1.71   FVC-Predicted Post % 64   Pre FEV1/FVC % % 78   Post FEV1/FCV % % 81   FEV1-Pre L 1.27   FEV1-Predicted Pre % 61   FEV1-Post  L 1.39   DLCO uncorrected ml/min/mmHg 9.06   DLCO UNC% % 43   DLVA Predicted % 78   TLC L 3.33   TLC % Predicted % 63   RV % Predicted % 77     FeNO: ***  Pathology: ***  Echocardiogram: ***  Heart Catheterization: ***    Assessment & Plan:   No diagnosis found.  Discussion: ***   Current Outpatient Medications:    acetaminophen (TYLENOL) 500 MG tablet, Take 1,000 mg by mouth every 6 (six) hours as needed for headache (pain). , Disp: , Rfl:    Alcohol Swabs (B-D SINGLE USE SWABS REGULAR) PADS, USE AS DIRECTED, Disp: 100 each, Rfl: 0   aspirin EC 81 MG tablet, Take 81 mg by mouth daily., Disp: , Rfl:    atorvastatin (LIPITOR) 10 MG tablet, Take 1 tablet (10 mg total) by mouth once daily., Disp: 90 tablet, Rfl: 1   Blood Glucose Monitoring Suppl (ONETOUCH VERIO) w/Device KIT, Use as instructed to test blood glucose 3 times daily., Disp: 1 kit, Rfl: 0   clobetasol cream (TEMOVATE) 0.05 %, Apply topically 2 (two) times daily. (Patient taking differently: Apply 1 Application topically daily as needed (Rash).), Disp: 60 g, Rfl: 0   Continuous Blood Gluc Sensor (DEXCOM G6 SENSOR) MISC, Use to check blood sugar three times daily. Dx. E11.59, Disp: 3 each, Rfl: 3   dapagliflozin propanediol (FARXIGA) 10 MG TABS  tablet, Take 1 tablet (10 mg total) by mouth daily before breakfast., Disp: 90 tablet, Rfl: 1   diclofenac Sodium (VOLTAREN) 1 % GEL, Apply 2 g topically daily as needed (muscle pain)., Disp: , Rfl:    furosemide (LASIX) 40 MG tablet, Take 1 tablet (40 mg total) by mouth 2 (two) times daily., Disp: 180 tablet, Rfl: 1   gabapentin (NEURONTIN) 300 MG capsule, Take orally 2 caps (600 mg) in the morning and 3 capsules (900 mg) in the evening, Disp: 450 capsule, Rfl: 1   glucose blood (ONETOUCH VERIO) test strip, Use as instructed to test blood glucose 3 times daily., Disp: 100 each, Rfl: 3   insulin glargine, 2 Unit Dial, (TOUJEO MAX SOLOSTAR) 300 UNIT/ML Solostar Pen, Inject 56 Units  into the skin daily., Disp: 6 mL, Rfl: 6   Insulin Pen Needle (DROPLET PEN NEEDLES) 31G X 8 MM MISC, USE WITH TOUJEO SOLOSTAR PEN EVERY DAY, Disp: 100 each, Rfl: 0   Lancets (ONETOUCH DELICA PLUS LANCET33G) MISC, Use as instructed to test blood glucose 3 times daily., Disp: 100 each, Rfl: 3   lisinopril (ZESTRIL) 10 MG tablet, Take 1 tablet (10 mg total) by mouth daily., Disp: 90 tablet, Rfl: 1   metFORMIN (GLUCOPHAGE-XR) 500 MG 24 hr tablet, Take 2 tablets (1,000 mg total) by mouth 2 (two) times daily., Disp: 360 tablet, Rfl: 1   metoprolol tartrate (LOPRESSOR) 50 MG tablet, Take 1 tablet (50 mg total) by mouth 2 (two) times daily., Disp: 180 tablet, Rfl: 1   Multiple Vitamin (MULTIVITAMIN WITH MINERALS) TABS tablet, Take 1 tablet by mouth daily., Disp: , Rfl:    nitroGLYCERIN (NITROSTAT) 0.4 MG SL tablet, Place 1 tablet (0.4 mg total) under the tongue every 5 (five) minutes as needed for chest pain., Disp: 25 tablet, Rfl: 3   pantoprazole (PROTONIX) 40 MG tablet, Take 1 tablet (40 mg total) by mouth 2 (two) times daily., Disp: 180 tablet, Rfl: 1   Semaglutide,0.25 or 0.5MG /DOS, (OZEMPIC, 0.25 OR 0.5 MG/DOSE,) 2 MG/1.5ML SOPN, Inject 0.25 mg into the skin once a week., Disp: 2 mL, Rfl: 6  I spent *** minutes dedicated to the care of this patient on the date of this encounter to include pre-visit review of records, face-to-face time with the patient discussing conditions above, post visit ordering of testing, clinical documentation with the electronic health record, making appropriate referrals as documented, and communicating necessary findings to members of the patients care team.   Josephine Igo, DO [facility] 12/23/2022 8:04 AM

## 2022-12-24 ENCOUNTER — Telehealth: Payer: Self-pay | Admitting: Family Medicine

## 2022-12-24 NOTE — Telephone Encounter (Signed)
Copied from CRM [id number]. Topic: General - Other >> Dec 24, 2022 10:24 AM Phill Myron wrote: Reed Pandy with [facility] will be refaxing a document requesting patients Rx's .Marland Kitchen Please notate if received Fax# [phone]

## 2022-12-27 ENCOUNTER — Other Ambulatory Visit: Payer: Self-pay

## 2022-12-27 DIAGNOSIS — E1169 Type 2 diabetes mellitus with other specified complication: Secondary | ICD-10-CM

## 2022-12-27 DIAGNOSIS — E1159 Type 2 diabetes mellitus with other circulatory complications: Secondary | ICD-10-CM

## 2022-12-27 DIAGNOSIS — E1122 Type 2 diabetes mellitus with diabetic chronic kidney disease: Secondary | ICD-10-CM

## 2022-12-27 MED ORDER — FUROSEMIDE 40 MG PO TABS: 40.0000 mg | ORAL_TABLET | Freq: Two times a day (BID) | ORAL | 1 refills | Status: DC

## 2022-12-27 MED ORDER — TOUJEO MAX SOLOSTAR 300 UNIT/ML ~~LOC~~ SOPN
56.0000 [IU] | PEN_INJECTOR | Freq: Every day | SUBCUTANEOUS | 6 refills | Status: DC
Start: 2022-12-27 — End: 2023-02-02

## 2022-12-27 MED ORDER — LISINOPRIL 10 MG PO TABS: 10.0000 mg | ORAL_TABLET | Freq: Every day | ORAL | 1 refills | Status: DC

## 2022-12-27 MED ORDER — ATORVASTATIN CALCIUM 10 MG PO TABS
10.0000 mg | ORAL_TABLET | Freq: Every day | ORAL | 1 refills | Status: DC
Start: 2022-12-27 — End: 2023-02-02

## 2022-12-27 MED ORDER — DAPAGLIFLOZIN PROPANEDIOL 10 MG PO TABS
10.0000 mg | ORAL_TABLET | Freq: Every day | ORAL | 1 refills | Status: DC
Start: 2022-12-27 — End: 2023-02-02

## 2022-12-27 NOTE — Telephone Encounter (Signed)
Form has been received and medication has been sent

## 2022-12-28 ENCOUNTER — Other Ambulatory Visit: Payer: Self-pay

## 2022-12-28 DIAGNOSIS — E1122 Type 2 diabetes mellitus with diabetic chronic kidney disease: Secondary | ICD-10-CM

## 2022-12-28 DIAGNOSIS — E1169 Type 2 diabetes mellitus with other specified complication: Secondary | ICD-10-CM

## 2022-12-28 DIAGNOSIS — I5032 Chronic diastolic (congestive) heart failure: Secondary | ICD-10-CM

## 2022-12-28 DIAGNOSIS — I10 Essential (primary) hypertension: Secondary | ICD-10-CM

## 2022-12-28 MED ORDER — METOPROLOL TARTRATE 50 MG PO TABS: 50.0000 mg | ORAL_TABLET | Freq: Two times a day (BID) | ORAL | 1 refills | Status: DC

## 2022-12-28 MED ORDER — PANTOPRAZOLE SODIUM 40 MG PO TBEC
40.0000 mg | DELAYED_RELEASE_TABLET | Freq: Two times a day (BID) | ORAL | 1 refills | Status: DC
Start: 2022-12-28 — End: 2023-02-02

## 2022-12-28 MED ORDER — METFORMIN HCL ER 500 MG PO TB24: 1000.0000 mg | ORAL_TABLET | Freq: Two times a day (BID) | ORAL | 1 refills | Status: DC

## 2022-12-28 NOTE — Progress Notes (Unsigned)
Synopsis: Referred in September 2024 for pulmonary nodule by Hoy Register, MD  Subjective:   PATIENT ID: Deborah Coffey GENDER: female DOB: 1955-09-06, MRN: [id number]  No chief complaint on file.   This is a 67 year old female, past medical history of coronary disease, history of CABG, history of chronic kidney disease stage IIIb, history of obesity hypoventilation syndrome, hypertension, pulmonary hypertension, type 2 diabetes.  Patient is a former smoker has a small pack-year history however.Patient had CT chest lung nodule follow-up that was completed on 06/23/2022.  Patient was referred for evaluation having waxing and waning pulmonary nodules and a new solid pulmonary nodule in the right upper lobe measuring 5.4 mm.      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 competitive flow   TIA (transient ischemic attack)    Type II diabetes mellitus (HCC)      Family History  Problem Relation Age of Onset   Heart disease Mother    Diabetes Mother    Cancer Mother        cervical   Diabetes Father    Diabetes Sister    Heart disease Sister    Lupus Sister    Heart failure Sister    Hepatitis C Sister    Hypertension Daughter    Diabetes Brother    Heart attack Brother    Heart failure Brother    Diabetes Brother    Colon cancer Neg Hx    Breast cancer Neg Hx      Past Surgical History:  Procedure Laterality Date   CARDIAC CATHETERIZATION  2010   Previous LAD 95% lesion -  now ~30-40%; patent LIMA with competitive flow   CESAREAN SECTION  1977; 1989   CHOLECYSTECTOMY  ~ 2012   CORONARY ARTERY BYPASS GRAFT  02/2004   "CABG X1" (03/22/2013); LIMA-LAD   RIGHT/LEFT HEART CATH AND CORONARY/GRAFT ANGIOGRAPHY N/A 04/16/2021   Procedure: RIGHT/LEFT HEART CATH AND CORONARY/GRAFT ANGIOGRAPHY;  Surgeon: Corky Crafts, MD;  Location: [facility];  Service: Cardiovascular;  Laterality: N/A;   TUBAL LIGATION  1989    Social History   Socioeconomic History   Marital status: Divorced    Spouse name: Not on file   Number of children: Not on file   Years of education: 12   Highest education level: Some college, no degree  Occupational History   Occupation: Disabled  Tobacco Use   Smoking status: Former    Current packs/day: 0.25    Average packs/day: 0.3 packs/day for 42.0 years (10.5 ttl pk-yrs)    Types: Cigarettes   Smokeless tobacco: Never   Tobacco comments:    Smokes 1-3 cigars/ black cigarettes a week. Agreed to referral to [place] quit line. Referral faxed. Patient given cessation information  Vaping Use   Vaping status: Never Used  Substance and Sexual Activity   Alcohol use: Not Currently  Drug use: No    Types: Marijuana    Comment: smoked pot years ago but not currently   Sexual activity: Not Currently  Other Topics Concern   Not on file  Social History Narrative   Lives with daughter and 3 grandchildren   Social Determinants of Health   Financial Resource Strain: Low Risk  (08/19/2022)   Overall Financial Resource Strain (CARDIA)    Difficulty of Paying Living Expenses: Not hard at all  Food Insecurity: No Food Insecurity (08/19/2022)   Hunger Vital Sign    Worried About Running Out of Food in the Last Year: Never true    Ran Out of Food in the Last Year: Never true  Transportation Needs: No Transportation Needs (08/19/2022)   PRAPARE - Administrator, Civil Service (Medical): No    Lack of Transportation (Non-Medical): No   Physical Activity: Inactive (08/19/2022)   Exercise Vital Sign    Days of Exercise per Week: 0 days    Minutes of Exercise per Session: 0 min  Stress: No Stress Concern Present (08/19/2022)   [employer] of Occupational Health - Occupational Stress Questionnaire    Feeling of Stress : Not at all  Social Connections: Not on file  Intimate Partner Violence: Not on file     Allergies  Allergen Reactions   Ciprofloxacin Rash     Outpatient Medications Prior to Visit  Medication Sig Dispense Refill   acetaminophen (TYLENOL) 500 MG tablet Take 1,000 mg by mouth every 6 (six) hours as needed for headache (pain).      Alcohol Swabs (B-D SINGLE USE SWABS REGULAR) PADS USE AS DIRECTED 100 each 0   aspirin EC 81 MG tablet Take 81 mg by mouth daily.     atorvastatin (LIPITOR) 10 MG tablet Take 1 tablet (10 mg total) by mouth once daily. 90 tablet 1   Blood Glucose Monitoring Suppl (ONETOUCH VERIO) w/Device KIT Use as instructed to test blood glucose 3 times daily. 1 kit 0   clobetasol cream (TEMOVATE) 0.05 % Apply topically 2 (two) times daily. (Patient taking differently: Apply 1 Application topically daily as needed (Rash).) 60 g 0   Continuous Blood Gluc Sensor (DEXCOM G6 SENSOR) MISC Use to check blood sugar three times daily. Dx. E11.59 3 each 3   dapagliflozin propanediol (FARXIGA) 10 MG TABS tablet Take 1 tablet (10 mg total) by mouth daily before breakfast. 90 tablet 1   diclofenac Sodium (VOLTAREN) 1 % GEL Apply 2 g topically daily as needed (muscle pain).     furosemide (LASIX) 40 MG tablet Take 1 tablet (40 mg total) by mouth 2 (two) times daily. 180 tablet 1   gabapentin (NEURONTIN) 300 MG capsule Take orally 2 caps (600 mg) in the morning and 3 capsules (900 mg) in the evening 450 capsule 1   glucose blood (ONETOUCH VERIO) test strip Use as instructed to test blood glucose 3 times daily. 100 each 3   insulin glargine, 2 Unit Dial, (TOUJEO MAX SOLOSTAR) 300 UNIT/ML Solostar Pen  Inject 56 Units into the skin daily. 6 mL 6   Insulin Pen Needle (DROPLET PEN NEEDLES) 31G X 8 MM MISC USE WITH TOUJEO SOLOSTAR PEN EVERY DAY 100 each 0   Lancets (ONETOUCH DELICA PLUS LANCET33G) MISC Use as instructed to test blood glucose 3 times daily. 100 each 3   lisinopril (ZESTRIL) 10 MG tablet Take 1 tablet (10 mg total) by mouth daily. 90 tablet 1   metFORMIN (GLUCOPHAGE-XR) 500 MG  24 hr tablet Take 2 tablets (1,000 mg total) by mouth 2 (two) times daily. 360 tablet 1   metoprolol tartrate (LOPRESSOR) 50 MG tablet Take 1 tablet (50 mg total) by mouth 2 (two) times daily. 180 tablet 1   Multiple Vitamin (MULTIVITAMIN WITH MINERALS) TABS tablet Take 1 tablet by mouth daily.     nitroGLYCERIN (NITROSTAT) 0.4 MG SL tablet Place 1 tablet (0.4 mg total) under the tongue every 5 (five) minutes as needed for chest pain. 25 tablet 3   pantoprazole (PROTONIX) 40 MG tablet Take 1 tablet (40 mg total) by mouth 2 (two) times daily. 180 tablet 1   Semaglutide,0.25 or 0.5MG /DOS, (OZEMPIC, 0.25 OR 0.5 MG/DOSE,) 2 MG/1.5ML SOPN Inject 0.25 mg into the skin once a week. 2 mL 6   No facility-administered medications prior to visit.    ROS   Objective:  Physical Exam   There were no vitals filed for this visit.   on *** LPM *** RA BMI Readings from Last 3 Encounters:  11/02/22 39.47 kg/m  08/19/22 38.77 kg/m  05/03/22 39.21 kg/m   Wt Readings from Last 3 Encounters:  11/02/22 237 lb 3.2 oz (107.6 kg)  08/19/22 233 lb (105.7 kg)  05/03/22 235 lb 9.6 oz (106.9 kg)     CBC    Component Value Date/Time   WBC 8.6 12/18/2021 1230   RBC 3.98 12/18/2021 1230   HGB 10.9 (L) 12/18/2021 1230   HGB 12.1 04/10/2021 1248   HCT 35.5 (L) 12/18/2021 1230   HCT 37.0 04/10/2021 1248   PLT 266 12/18/2021 1230   PLT 261 04/10/2021 1248   MCV 89.2 12/18/2021 1230   MCV 83 04/10/2021 1248   MCH 27.4 12/18/2021 1230   MCHC 30.7 12/18/2021 1230   RDW 16.9 (H) 12/18/2021 1230   RDW 13.7 04/10/2021  1248   LYMPHSABS 1.5 12/18/2021 1230   LYMPHSABS 1.9 01/21/2021 1618   MONOABS 0.7 12/18/2021 1230   EOSABS 0.1 12/18/2021 1230   EOSABS 0.1 01/21/2021 1618   BASOSABS 0.0 12/18/2021 1230   BASOSABS 0.0 01/21/2021 1618    ***  Chest Imaging: ***  Pulmonary Functions Testing Results:    Latest Ref Rng & Units 05/28/2021    9:53 AM  PFT Results  FVC-Pre L 1.63   FVC-Predicted Pre % 61   FVC-Post L 1.71   FVC-Predicted Post % 64   Pre FEV1/FVC % % 78   Post FEV1/FCV % % 81   FEV1-Pre L 1.27   FEV1-Predicted Pre % 61   FEV1-Post L 1.39   DLCO uncorrected ml/min/mmHg 9.06   DLCO UNC% % 43   DLVA Predicted % 78   TLC L 3.33   TLC % Predicted % 63   RV % Predicted % 77     FeNO: ***  Pathology: ***  Echocardiogram: ***  Heart Catheterization: ***    Assessment & Plan:     ICD-10-CM   1. Multiple pulmonary nodules  R91.8       Discussion: ***   Current Outpatient Medications:    acetaminophen (TYLENOL) 500 MG tablet, Take 1,000 mg by mouth every 6 (six) hours as needed for headache (pain). , Disp: , Rfl:    Alcohol Swabs (B-D SINGLE USE SWABS REGULAR) PADS, USE AS DIRECTED, Disp: 100 each, Rfl: 0   aspirin EC 81 MG tablet, Take 81 mg by mouth daily., Disp: , Rfl:    atorvastatin (LIPITOR) 10 MG tablet, Take 1 tablet (10 mg total) by  mouth once daily., Disp: 90 tablet, Rfl: 1   Blood Glucose Monitoring Suppl (ONETOUCH VERIO) w/Device KIT, Use as instructed to test blood glucose 3 times daily., Disp: 1 kit, Rfl: 0   clobetasol cream (TEMOVATE) 0.05 %, Apply topically 2 (two) times daily. (Patient taking differently: Apply 1 Application topically daily as needed (Rash).), Disp: 60 g, Rfl: 0   Continuous Blood Gluc Sensor (DEXCOM G6 SENSOR) MISC, Use to check blood sugar three times daily. Dx. E11.59, Disp: 3 each, Rfl: 3   dapagliflozin propanediol (FARXIGA) 10 MG TABS tablet, Take 1 tablet (10 mg total) by mouth daily before breakfast., Disp: 90 tablet, Rfl: 1    diclofenac Sodium (VOLTAREN) 1 % GEL, Apply 2 g topically daily as needed (muscle pain)., Disp: , Rfl:    furosemide (LASIX) 40 MG tablet, Take 1 tablet (40 mg total) by mouth 2 (two) times daily., Disp: 180 tablet, Rfl: 1   gabapentin (NEURONTIN) 300 MG capsule, Take orally 2 caps (600 mg) in the morning and 3 capsules (900 mg) in the evening, Disp: 450 capsule, Rfl: 1   glucose blood (ONETOUCH VERIO) test strip, Use as instructed to test blood glucose 3 times daily., Disp: 100 each, Rfl: 3   insulin glargine, 2 Unit Dial, (TOUJEO MAX SOLOSTAR) 300 UNIT/ML Solostar Pen, Inject 56 Units into the skin daily., Disp: 6 mL, Rfl: 6   Insulin Pen Needle (DROPLET PEN NEEDLES) 31G X 8 MM MISC, USE WITH TOUJEO SOLOSTAR PEN EVERY DAY, Disp: 100 each, Rfl: 0   Lancets (ONETOUCH DELICA PLUS LANCET33G) MISC, Use as instructed to test blood glucose 3 times daily., Disp: 100 each, Rfl: 3   lisinopril (ZESTRIL) 10 MG tablet, Take 1 tablet (10 mg total) by mouth daily., Disp: 90 tablet, Rfl: 1   metFORMIN (GLUCOPHAGE-XR) 500 MG 24 hr tablet, Take 2 tablets (1,000 mg total) by mouth 2 (two) times daily., Disp: 360 tablet, Rfl: 1   metoprolol tartrate (LOPRESSOR) 50 MG tablet, Take 1 tablet (50 mg total) by mouth 2 (two) times daily., Disp: 180 tablet, Rfl: 1   Multiple Vitamin (MULTIVITAMIN WITH MINERALS) TABS tablet, Take 1 tablet by mouth daily., Disp: , Rfl:    nitroGLYCERIN (NITROSTAT) 0.4 MG SL tablet, Place 1 tablet (0.4 mg total) under the tongue every 5 (five) minutes as needed for chest pain., Disp: 25 tablet, Rfl: 3   pantoprazole (PROTONIX) 40 MG tablet, Take 1 tablet (40 mg total) by mouth 2 (two) times daily., Disp: 180 tablet, Rfl: 1   Semaglutide,0.25 or 0.5MG /DOS, (OZEMPIC, 0.25 OR 0.5 MG/DOSE,) 2 MG/1.5ML SOPN, Inject 0.25 mg into the skin once a week., Disp: 2 mL, Rfl: 6  I spent *** minutes dedicated to the care of this patient on the date of this encounter to include pre-visit review of records,  face-to-face time with the patient discussing conditions above, post visit ordering of testing, clinical documentation with the electronic health record, making appropriate referrals as documented, and communicating necessary findings to members of the patients care team.   Josephine Igo, DO [facility] 12/28/2022 4:34 PM

## 2022-12-29 ENCOUNTER — Ambulatory Visit (INDEPENDENT_AMBULATORY_CARE_PROVIDER_SITE_OTHER): Payer: Medicare PPO | Admitting: Pulmonary Disease

## 2022-12-29 ENCOUNTER — Encounter: Payer: Self-pay | Admitting: Pulmonary Disease

## 2022-12-29 VITALS — BP 112/60 | HR 84 | Ht 65.0 in | Wt 230.2 lb

## 2022-12-29 DIAGNOSIS — R918 Other nonspecific abnormal finding of lung field: Secondary | ICD-10-CM | POA: Diagnosis not present

## 2022-12-29 DIAGNOSIS — E669 Obesity, unspecified: Secondary | ICD-10-CM | POA: Diagnosis not present

## 2022-12-29 DIAGNOSIS — Z6838 Body mass index (BMI) 38.0-38.9, adult: Secondary | ICD-10-CM | POA: Diagnosis not present

## 2022-12-29 DIAGNOSIS — Z716 Tobacco abuse counseling: Secondary | ICD-10-CM | POA: Diagnosis not present

## 2022-12-29 DIAGNOSIS — R0609 Other forms of dyspnea: Secondary | ICD-10-CM

## 2022-12-29 DIAGNOSIS — F17200 Nicotine dependence, unspecified, uncomplicated: Secondary | ICD-10-CM

## 2022-12-29 MED ORDER — ALBUTEROL SULFATE HFA 108 (90 BASE) MCG/ACT IN AERS: 2.0000 | INHALATION_SPRAY | Freq: Four times a day (QID) | RESPIRATORY_TRACT | 6 refills | Status: DC | PRN

## 2022-12-29 NOTE — Patient Instructions (Signed)
Thank you for visiting Dr. Tonia Brooms at [facility]. Today we recommend the following:  Orders Placed This Encounter  Procedures   CT CHEST NODULE FOLLOW UP LOW DOSE W/O   Pulmonary Function Test   Meds ordered this encounter  Medications   albuterol (VENTOLIN HFA) 108 (90 Base) MCG/ACT inhaler    Sig: Inhale 2 puffs into the lungs every 6 (six) hours as needed for wheezing or shortness of breath.    Dispense:  8 g    Refill:  6   Return in about 8 weeks (around 02/23/2023) for with APP, after PFTs.    Please do your part to reduce the spread of COVID-19.

## 2023-01-13 ENCOUNTER — Ambulatory Visit (HOSPITAL_BASED_OUTPATIENT_CLINIC_OR_DEPARTMENT_OTHER)
Admission: RE | Admit: 2023-01-13 | Discharge: 2023-01-13 | Disposition: A | Discharge status: Home / Self Care | Payer: Medicare PPO | Source: Ambulatory Visit | Attending: Pulmonary Disease | Admitting: Pulmonary Disease

## 2023-01-13 DIAGNOSIS — R918 Other nonspecific abnormal finding of lung field: Secondary | ICD-10-CM | POA: Insufficient documentation

## 2023-01-13 DIAGNOSIS — Z87891 Personal history of nicotine dependence: Secondary | ICD-10-CM | POA: Diagnosis not present

## 2023-02-02 ENCOUNTER — Ambulatory Visit: Payer: Medicare PPO | Attending: Family Medicine | Admitting: Family Medicine

## 2023-02-02 ENCOUNTER — Encounter: Payer: Self-pay | Admitting: Family Medicine

## 2023-02-02 VITALS — BP 105/66 | HR 77 | Ht 65.0 in | Wt 234.4 lb

## 2023-02-02 DIAGNOSIS — I129 Hypertensive chronic kidney disease with stage 1 through stage 4 chronic kidney disease, or unspecified chronic kidney disease: Secondary | ICD-10-CM

## 2023-02-02 DIAGNOSIS — E1142 Type 2 diabetes mellitus with diabetic polyneuropathy: Secondary | ICD-10-CM

## 2023-02-02 DIAGNOSIS — I5032 Chronic diastolic (congestive) heart failure: Secondary | ICD-10-CM

## 2023-02-02 DIAGNOSIS — E1169 Type 2 diabetes mellitus with other specified complication: Secondary | ICD-10-CM

## 2023-02-02 DIAGNOSIS — E1159 Type 2 diabetes mellitus with other circulatory complications: Secondary | ICD-10-CM

## 2023-02-02 DIAGNOSIS — E1122 Type 2 diabetes mellitus with diabetic chronic kidney disease: Secondary | ICD-10-CM

## 2023-02-02 DIAGNOSIS — E1165 Type 2 diabetes mellitus with hyperglycemia: Secondary | ICD-10-CM | POA: Diagnosis not present

## 2023-02-02 DIAGNOSIS — Z794 Long term (current) use of insulin: Secondary | ICD-10-CM | POA: Diagnosis not present

## 2023-02-02 DIAGNOSIS — I10 Essential (primary) hypertension: Secondary | ICD-10-CM

## 2023-02-02 DIAGNOSIS — E785 Hyperlipidemia, unspecified: Secondary | ICD-10-CM

## 2023-02-02 LAB — POCT GLYCOSYLATED HEMOGLOBIN (HGB A1C): HbA1c, POC (controlled diabetic range): 8.2 % — AB (ref 0.0–7.0)

## 2023-02-02 MED ORDER — ATORVASTATIN CALCIUM 10 MG PO TABS
10.0000 mg | ORAL_TABLET | Freq: Every day | ORAL | 1 refills | Status: DC
Start: 2023-02-02 — End: 2023-10-17

## 2023-02-02 MED ORDER — GABAPENTIN 300 MG PO CAPS
ORAL_CAPSULE | ORAL | 1 refills | Status: DC
Start: 2023-02-02 — End: 2023-06-24

## 2023-02-02 MED ORDER — DROPLET PEN NEEDLES 31G X 8 MM MISC
0 refills | Status: DC
Start: 2023-02-02 — End: 2024-02-09

## 2023-02-02 MED ORDER — FUROSEMIDE 40 MG PO TABS: 40.0000 mg | ORAL_TABLET | Freq: Two times a day (BID) | ORAL | 1 refills | Status: DC

## 2023-02-02 MED ORDER — LISINOPRIL 10 MG PO TABS
10.0000 mg | ORAL_TABLET | Freq: Every day | ORAL | 1 refills | Status: DC
Start: 2023-02-02 — End: 2023-03-24

## 2023-02-02 MED ORDER — METFORMIN HCL ER 500 MG PO TB24
1000.0000 mg | ORAL_TABLET | Freq: Two times a day (BID) | ORAL | 1 refills | Status: DC
Start: 2023-02-02 — End: 2024-01-03

## 2023-02-02 MED ORDER — METOPROLOL TARTRATE 50 MG PO TABS
50.0000 mg | ORAL_TABLET | Freq: Two times a day (BID) | ORAL | 1 refills | Status: DC
Start: 2023-02-02 — End: 2023-10-11

## 2023-02-02 MED ORDER — DAPAGLIFLOZIN PROPANEDIOL 10 MG PO TABS
10.0000 mg | ORAL_TABLET | Freq: Every day | ORAL | 1 refills | Status: DC
Start: 2023-02-02 — End: 2023-10-17

## 2023-02-02 MED ORDER — PANTOPRAZOLE SODIUM 40 MG PO TBEC
40.0000 mg | DELAYED_RELEASE_TABLET | Freq: Two times a day (BID) | ORAL | 1 refills | Status: DC
Start: 2023-02-02 — End: 2023-10-17

## 2023-02-02 MED ORDER — DEXCOM G6 SENSOR MISC
3 refills | Status: DC
Start: 2023-02-02 — End: 2023-08-10

## 2023-02-02 MED ORDER — OZEMPIC (0.25 OR 0.5 MG/DOSE) 2 MG/1.5ML ~~LOC~~ SOPN: 0.2500 mg | PEN_INJECTOR | SUBCUTANEOUS | 6 refills | Status: DC

## 2023-02-02 MED ORDER — DICLOFENAC SODIUM 1 % EX GEL: 2.0000 g | Freq: Every day | CUTANEOUS | 1 refills | Status: AC | PRN

## 2023-02-02 MED ORDER — TOUJEO MAX SOLOSTAR 300 UNIT/ML ~~LOC~~ SOPN
64.0000 [IU] | PEN_INJECTOR | Freq: Every day | SUBCUTANEOUS | 6 refills | Status: DC
Start: 2023-02-02 — End: 2023-05-11

## 2023-02-02 NOTE — Patient Instructions (Signed)
Semaglutide Injection What is this medication? SEMAGLUTIDE (SEM a GLOO tide) treats type 2 diabetes. It works by increasing insulin levels in your body, which decreases your blood sugar (glucose).   It also reduces the amount of sugar released into your blood and slows down your digestion. It may also be used to lower the risk of heart attack and stroke in people with type 2 diabetes. Changes to diet and exercise are often combined with this medication. This medicine may be used for other purposes; ask your health care provider or pharmacist if you have questions. COMMON BRAND NAME(S): OZEMPIC What should I tell my care team before I take this medication? They need to know if you have any of these conditions: Endocrine tumors (MEN 2) or if someone in your family had these tumors Eye disease, vision problems History of pancreatitis Kidney disease Stomach problems Thyroid cancer or if someone in your family had thyroid cancer An unusual or allergic reaction to semaglutide, other medications, foods, dyes, or preservatives Pregnant or trying to get pregnant Breast-feeding How should I use this medication? This medication is for injection under the skin of your upper leg (thigh), stomach area, or upper arm. It is given once every week (every 7 days). You will be taught how to prepare and give this medication. Use exactly as directed. Take your medication at regular intervals. Do not take it more often than directed. If you use this medication with insulin, you should inject this medication and the insulin separately. Do not mix them together. Do not give the injections right next to each other. Change (rotate) injection sites with each injection. It is important that you put your used needles and syringes in a special sharps container. Do not put them in a trash can. If you do not have a sharps container, call your pharmacist or care team to get one. A special MedGuide will be given to you by the  pharmacist with each prescription and refill. Be sure to read this information carefully each time. This medication comes with INSTRUCTIONS FOR USE. Ask your pharmacist for directions on how to use this medication. Read the information carefully. Talk to your pharmacist or care team if you have questions. Talk to your care team about the use of this medication in children. Special care may be needed. Overdosage: If you think you have taken too much of this medicine contact a poison control center or emergency room at once. NOTE: This medicine is only for you. Do not share this medicine with others. What if I miss a dose? If you miss a dose, take it as soon as you can within 5 days after the missed dose. Then take your next dose at your regular weekly time. If it has been longer than 5 days after the missed dose, do not take the missed dose. Take the next dose at your regular time. Do not take double or extra doses. If you have questions about a missed dose, contact your care team for advice. What may interact with this medication? Other medications for diabetes Many medications may cause changes in blood sugar, these include: Alcohol containing beverages Antiviral medications for HIV or AIDS Aspirin and aspirin-like medications Certain medications for blood pressure, heart disease, irregular heart beat Chromium Diuretics Female hormones, such as estrogens or progestins, birth control pills Fenofibrate Gemfibrozil Isoniazid Lanreotide Female hormones or anabolic steroids MAOIs like Carbex, Eldepryl, Marplan, Nardil, and Parnate Medications for weight loss Medications for allergies, asthma, cold, or cough Medications  for depression, anxiety, or psychotic disturbances Niacin Nicotine NSAIDs, medications for pain and inflammation, like ibuprofen or naproxen Octreotide Pasireotide Pentamidine Phenytoin Probenecid Quinolone antibiotics such as ciprofloxacin, levofloxacin, ofloxacin Some  herbal dietary supplements Steroid medications such as prednisone or cortisone Sulfamethoxazole; trimethoprim Thyroid hormones Some medications can hide the warning symptoms of low blood sugar (hypoglycemia). You may need to monitor your blood sugar more closely if you are taking one of these medications. These include: Beta-blockers, often used for high blood pressure or heart problems (examples include atenolol, metoprolol, propranolol) Clonidine Guanethidine Reserpine This list may not describe all possible interactions. Give your health care provider a list of all the medicines, herbs, non-prescription drugs, or dietary supplements you use. Also tell them if you smoke, drink alcohol, or use illegal drugs. Some items may interact with your medicine. What should I watch for while using this medication? Visit your care team for regular checks on your progress. Drink plenty of fluids while taking this medication. Check with your care team if you get an attack of severe diarrhea, nausea, and vomiting. The loss of too much body fluid can make it dangerous for you to take this medication. A test called the HbA1C (A1C) will be monitored. This is a simple blood test. It measures your blood sugar control over the last 2 to 3 months. You will receive this test every 3 to 6 months. Learn how to check your blood sugar. Learn the symptoms of low and high blood sugar and how to manage them. Always carry a quick-source of sugar with you in case you have symptoms of low blood sugar. Examples include hard sugar candy or glucose tablets. Make sure others know that you can choke if you eat or drink when you develop serious symptoms of low blood sugar, such as seizures or unconsciousness. They must get medical help at once. Tell your care team if you have high blood sugar. You might need to change the dose of your medication. If you are sick or exercising more than usual, you might need to change the dose of your  medication. Do not skip meals. Ask your care team if you should avoid alcohol. Many nonprescription cough and cold products contain sugar or alcohol. These can affect blood sugar. Pens should never be shared. Even if the needle is changed, sharing may result in passing of viruses like hepatitis or HIV. Wear a medical ID bracelet or chain, and carry a card that describes your disease and details of your medication and dosage times. What side effects may I notice from receiving this medication? Side effects that you should report to your care team as soon as possible: Allergic reactions--skin rash, itching, hives, swelling of the face, lips, tongue, or throat Change in vision Dehydration--increased thirst, dry mouth, feeling faint or lightheaded, headache, dark yellow or brown urine Gallbladder problems--severe stomach pain, nausea, vomiting, fever Heart palpitations--rapid, pounding, or irregular heartbeat Kidney injury--decrease in the amount of urine, swelling of the ankles, hands, or feet Pancreatitis--severe stomach pain that spreads to your back or gets worse after eating or when touched, fever, nausea, vomiting Thoughts of suicide or self-harm, worsening mood, feelings of depression Thyroid cancer--new mass or lump in the neck, pain or trouble swallowing, trouble breathing, hoarseness Side effects that usually do not require medical attention (report these to your care team if they continue or are bothersome): Diarrhea Loss of appetite Nausea Upset stomach This list may not describe all possible side effects. Call your doctor for medical  advice about side effects. You may report side effects to FDA at 1-800-FDA-1088. Where should I keep my medication? Keep out of the reach of children. Store unopened pens in a refrigerator between 2 and 8 degrees C (36 and 46 degrees F). Do not freeze. Protect from light and heat. After you first use the pen, it can be stored for 56 days at room  temperature between 15 and 30 degrees C (59 and 86 degrees F) or in a refrigerator. Throw away your used pen after 56 days or after the expiration date, whichever comes first. Do not store your pen with the needle attached. If the needle is left on, medication may leak from the pen. NOTE: This sheet is a summary. It may not cover all possible information. If you have questions about this medicine, talk to your doctor, pharmacist, or health care provider.  2024 Elsevier/Gold Standard (2022-07-12 00:00:00)

## 2023-02-02 NOTE — Progress Notes (Signed)
Subjective:  Patient ID: Deborah Coffey, female    DOB: [birth year]  Age: 67 y.o. MRN: [id number]  CC: Medical Management of Chronic Issues   HPI Deborah Coffey is a 67 y.o. year old female with a history of Type 2 DM (A1c 8.2), Diabetic neuropathy, Hypertension, CAD (s/p CABG), pulmonary hypertension with RV strain. here for a follow-up visit    Interval History: Discussed the use of AI scribe software for clinical note transcription with the patient, who gave verbal consent to proceed.  She presents for follow-up after cardiac rehab and pulmonary consultation. She reports attending cardiac rehab classes, including cooking classes, and found them beneficial. She also saw a pulmonologist, Dr Tonia Brooms who has been monitoring her pulmonary nodules and she recently had a CT scan but states his office has not contacted her with results.  She denies any current chest pain or shortness of breath. With regards to her CAD she has not followed up with cardiology in over a year.  Her cardiologist is Dr. Gala Romney. Echo from 03/2021 revealed EF of 65 to 70%, no regional wall motion abnormality please, right ventricular pressure and volume overload, severely elevated PA pressure She endorses adherence with her medications and her blood pressure is controlled, she is on her statin.  The patient's diabetes management is a concern, with a recent HbA1c of 8.2, up from 8.1. She is currently on Farxiga 10mg  and Lantus insulin, with a reported dose of 64 units, although the chart indicates 56 units. The patient has been prescribed Ozempic but has been hesitant to start due to concerns about side effects heard on television.    Past Medical History:  Diagnosis Date   Allergy    Anemia    CAD in native artery 02/18/2004   Mid LAD lesoin just after Large D1 --> CABG x 1 LIMA-LAD   CAP (community acquired pneumonia) 08/07/2014   Chronic kidney disease, stage 3b (HCC)    Depression    High cholesterol     History of blood transfusion 04/20/2003   "related to [facility]"    Hypertension    Lung nodules    Mild carotid artery disease (HCC)    Pulmonary hypertension (HCC)    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 competitive flow   TIA (transient ischemic attack)    Type II diabetes mellitus (HCC)     Past Surgical History:  Procedure Laterality Date   CARDIAC CATHETERIZATION  2010   Previous LAD 95% lesion - now ~30-40%; patent LIMA with competitive flow   CESAREAN SECTION  1977; 1989   CHOLECYSTECTOMY  ~ 2012   CORONARY ARTERY BYPASS GRAFT  02/2004   "CABG X1" (03/22/2013); LIMA-LAD   RIGHT/LEFT HEART CATH AND CORONARY/GRAFT ANGIOGRAPHY N/A 04/16/2021   Procedure: RIGHT/LEFT HEART CATH AND CORONARY/GRAFT ANGIOGRAPHY;  Surgeon: Corky Crafts, MD;  Location: [facility];  Service: Cardiovascular;  Laterality: N/A;   TUBAL LIGATION  1989    Family History  Problem Relation Age of Onset   Heart disease Mother    Diabetes Mother    Cancer Mother        cervical   Diabetes Father    Diabetes Sister    Heart disease Sister    Lupus Sister    Heart failure Sister    Hepatitis C Sister    Hypertension Daughter    Diabetes Brother    Heart attack Brother    Heart  failure Brother    Diabetes Brother    Colon cancer Neg Hx    Breast cancer Neg Hx     Social History   Socioeconomic History   Marital status: Divorced    Spouse name: Not on file   Number of children: Not on file   Years of education: 12   Highest education level: Some college, no degree  Occupational History   Occupation: Disabled  Tobacco Use   Smoking status: Former    Current packs/day: 0.25    Average packs/day: 0.3 packs/day for 42.0 years (10.5 ttl pk-yrs)    Types: Cigarettes   Smokeless tobacco: Never   Tobacco comments:    Smokes 1-3 cigars/ black cigarettes a week. Agreed to referral to [place] quit line. Referral faxed. Patient given  cessation information  Vaping Use   Vaping status: Never Used  Substance and Sexual Activity   Alcohol use: Not Currently   Drug use: No    Types: Marijuana    Comment: smoked pot years ago but not currently   Sexual activity: Not Currently  Other Topics Concern   Not on file  Social History Narrative   Lives with daughter and 3 grandchildren   Social Determinants of Health   Financial Resource Strain: Low Risk  (08/19/2022)   Overall Financial Resource Strain (CARDIA)    Difficulty of Paying Living Expenses: Not hard at all  Food Insecurity: No Food Insecurity (08/19/2022)   Hunger Vital Sign    Worried About Running Out of Food in the Last Year: Never true    Ran Out of Food in the Last Year: Never true  Transportation Needs: No Transportation Needs (08/19/2022)   PRAPARE - Administrator, Civil Service (Medical): No    Lack of Transportation (Non-Medical): No  Physical Activity: Inactive (08/19/2022)   Exercise Vital Sign    Days of Exercise per Week: 0 days    Minutes of Exercise per Session: 0 min  Stress: No Stress Concern Present (08/19/2022)   [employer] of Occupational Health - Occupational Stress Questionnaire    Feeling of Stress : Not at all  Social Connections: Not on file    Allergies  Allergen Reactions   Ciprofloxacin Rash    Outpatient Medications Prior to Visit  Medication Sig Dispense Refill   acetaminophen (TYLENOL) 500 MG tablet Take 1,000 mg by mouth every 6 (six) hours as needed for headache (pain).      albuterol (VENTOLIN HFA) 108 (90 Base) MCG/ACT inhaler Inhale 2 puffs into the lungs every 6 (six) hours as needed for wheezing or shortness of breath. 8 g 6   Alcohol Swabs (B-D SINGLE USE SWABS REGULAR) PADS USE AS DIRECTED 100 each 0   aspirin EC 81 MG tablet Take 81 mg by mouth daily.     Blood Glucose Monitoring Suppl (ONETOUCH VERIO) w/Device KIT Use as instructed to test blood glucose 3 times daily. 1 kit 0   clobetasol cream  (TEMOVATE) 0.05 % Apply topically 2 (two) times daily. (Patient taking differently: Apply 1 Application topically daily as needed (Rash).) 60 g 0   glucose blood (ONETOUCH VERIO) test strip Use as instructed to test blood glucose 3 times daily. 100 each 3   Lancets (ONETOUCH DELICA PLUS LANCET33G) MISC Use as instructed to test blood glucose 3 times daily. 100 each 3   Multiple Vitamin (MULTIVITAMIN WITH MINERALS) TABS tablet Take 1 tablet by mouth daily.     nitroGLYCERIN (NITROSTAT) 0.4 MG SL  tablet Place 1 tablet (0.4 mg total) under the tongue every 5 (five) minutes as needed for chest pain. 25 tablet 3   atorvastatin (LIPITOR) 10 MG tablet Take 1 tablet (10 mg total) by mouth once daily. 90 tablet 1   Continuous Blood Gluc Sensor (DEXCOM G6 SENSOR) MISC Use to check blood sugar three times daily. Dx. E11.59 3 each 3   dapagliflozin propanediol (FARXIGA) 10 MG TABS tablet Take 1 tablet (10 mg total) by mouth daily before breakfast. 90 tablet 1   diclofenac Sodium (VOLTAREN) 1 % GEL Apply 2 g topically daily as needed (muscle pain).     furosemide (LASIX) 40 MG tablet Take 1 tablet (40 mg total) by mouth 2 (two) times daily. 180 tablet 1   gabapentin (NEURONTIN) 300 MG capsule Take orally 2 caps (600 mg) in the morning and 3 capsules (900 mg) in the evening 450 capsule 1   insulin glargine, 2 Unit Dial, (TOUJEO MAX SOLOSTAR) 300 UNIT/ML Solostar Pen Inject 56 Units into the skin daily. 6 mL 6   Insulin Pen Needle (DROPLET PEN NEEDLES) 31G X 8 MM MISC USE WITH TOUJEO SOLOSTAR PEN EVERY DAY 100 each 0   lisinopril (ZESTRIL) 10 MG tablet Take 1 tablet (10 mg total) by mouth daily. 90 tablet 1   metFORMIN (GLUCOPHAGE-XR) 500 MG 24 hr tablet Take 2 tablets (1,000 mg total) by mouth 2 (two) times daily. 360 tablet 1   metoprolol tartrate (LOPRESSOR) 50 MG tablet Take 1 tablet (50 mg total) by mouth 2 (two) times daily. 180 tablet 1   pantoprazole (PROTONIX) 40 MG tablet Take 1 tablet (40 mg total) by  mouth 2 (two) times daily. 180 tablet 1   Semaglutide,0.25 or 0.5MG /DOS, (OZEMPIC, 0.25 OR 0.5 MG/DOSE,) 2 MG/1.5ML SOPN Inject 0.25 mg into the skin once a week. (Patient not taking: Reported on 02/02/2023) 2 mL 6   No facility-administered medications prior to visit.     ROS Review of Systems  Constitutional:  Negative for activity change and appetite change.  HENT:  Negative for sinus pressure and sore throat.   Respiratory:  Negative for chest tightness, shortness of breath and wheezing.   Cardiovascular:  Negative for chest pain and palpitations.  Gastrointestinal:  Negative for abdominal distention, abdominal pain and constipation.  Genitourinary: Negative.   Musculoskeletal: Negative.   Psychiatric/Behavioral:  Negative for behavioral problems and dysphoric mood.     Objective:  BP 105/66   Pulse 77   Ht 5\' 5"  (1.651 m)   Wt 234 lb 6.4 oz (106.3 kg)   LMP  (LMP Unknown)   SpO2 96%   BMI 39.01 kg/m      02/02/2023    2:19 PM 12/29/2022    8:45 AM 11/02/2022    2:16 PM  BP/Weight  Systolic BP 105 112 120  Diastolic BP 66 60 77  Wt. (Lbs) 234.4 230.2 237.2  BMI 39.01 kg/m2 38.31 kg/m2 39.47 kg/m2      Physical Exam Constitutional:      Appearance: She is well-developed.  Cardiovascular:     Rate and Rhythm: Normal rate.     Heart sounds: Normal heart sounds. No murmur heard. Pulmonary:     Effort: Pulmonary effort is normal.     Breath sounds: Normal breath sounds. No wheezing or rales.  Chest:     Chest wall: No tenderness.  Abdominal:     General: Bowel sounds are normal. There is no distension.     Palpations: Abdomen is  soft. There is no mass.     Tenderness: There is no abdominal tenderness.  Musculoskeletal:        General: Normal range of motion.     Right lower leg: No edema.     Left lower leg: No edema.  Neurological:     Mental Status: She is alert and oriented to person, place, and time.  Psychiatric:        Mood and Affect: Mood normal.         Latest Ref Rng & Units 11/02/2022    3:00 PM 01/07/2022   10:40 AM 12/28/2021   11:19 AM  CMP  Glucose 70 - 99 mg/dL 62  161  096   BUN 8 - 27 mg/dL 28  28  22    Creatinine 0.57 - 1.00 mg/dL 0.45  4.09  8.11   Sodium 134 - 144 mmol/L 140  141  139   Potassium 3.5 - 5.2 mmol/L 4.8  4.3  5.3   Chloride 96 - 106 mmol/L 103  108  106   CO2 20 - 29 mmol/L 21  25  24    Calcium 8.7 - 10.3 mg/dL 91.4  9.7  9.4   Total Protein 6.0 - 8.5 g/dL 7.4     Total Bilirubin 0.0 - 1.2 mg/dL 0.3     Alkaline Phos 44 - 121 IU/L 184     AST 0 - 40 IU/L 37     ALT 0 - 32 IU/L 34       Lipid Panel     Component Value Date/Time   CHOL 161 01/21/2021 1618   TRIG 104 01/21/2021 1618   HDL 72 01/21/2021 1618   CHOLHDL 2.2 01/21/2021 1618   CHOLHDL 2.2 09/02/2016 0533   VLDL 13 09/02/2016 0533   LDLCALC 70 01/21/2021 1618    CBC    Component Value Date/Time   WBC 8.6 12/18/2021 1230   RBC 3.98 12/18/2021 1230   HGB 10.9 (L) 12/18/2021 1230   HGB 12.1 04/10/2021 1248   HCT 35.5 (L) 12/18/2021 1230   HCT 37.0 04/10/2021 1248   PLT 266 12/18/2021 1230   PLT 261 04/10/2021 1248   MCV 89.2 12/18/2021 1230   MCV 83 04/10/2021 1248   MCH 27.4 12/18/2021 1230   MCHC 30.7 12/18/2021 1230   RDW 16.9 (H) 12/18/2021 1230   RDW 13.7 04/10/2021 1248   LYMPHSABS 1.5 12/18/2021 1230   LYMPHSABS 1.9 01/21/2021 1618   MONOABS 0.7 12/18/2021 1230   EOSABS 0.1 12/18/2021 1230   EOSABS 0.1 01/21/2021 1618   BASOSABS 0.0 12/18/2021 1230   BASOSABS 0.0 01/21/2021 1618    Lab Results  Component Value Date   HGBA1C 8.2 (A) 02/02/2023    Assessment & Plan:      Type II Diabetes Mellitus A1c 8.2, up from 8.1, indicating suboptimal control. Currently on Farxiga 10mg  daily, Metformin, and Lantus 64 units daily. Patient has been hesitant to start Ozempic due to concerns about side effects. -Increase Lantus to 64 units daily as per patient's report. -Start Ozempic once weekly, with patient  education about potential benefits and side effects. -Check A1c in 3 months to assess response to medication adjustments.  CAD Patient has completed cardiac rehab and reports no current chest pain.  -Recommend follow-up with cardiologist Dr. Gala Romney.  Diastolic CHF EF 65 to 70% from echo 2022 with right ventricular overload, pulmonary artery hypertension Currently on SGLT2, Lasix, ACE inhibitor Advised to schedule appointment with the CHF clinic  Pulmonary nodules Patient has seen pulmonologist Dr. Tonia Brooms and had a CT scan of the lungs,  -Advise patient to contact Dr. Marjory Lies office to discuss CT scan results. -Annual CT scan recommended  Hypertension Controlled Continue current regimen -Counseled on blood pressure goal of less than 130/80, low-sodium, DASH diet, medication compliance, 150 minutes of moderate intensity exercise per week. Discussed medication compliance, adverse effects.  Hyperlipidemia Controlled Due repeat lipid panel Doing well on statin  General Health Maintenance -Refer to ophthalmologist for routine diabetic eye exam. -Confirm receipt of flu shot, pneumonia vaccine, and shingles vaccine from pharmacy.          Meds ordered this encounter  Medications   Continuous Glucose Sensor (DEXCOM G6 SENSOR) MISC    Sig: Use to check blood sugar three times daily. Dx. E11.59    Dispense:  3 each    Refill:  3   diclofenac Sodium (VOLTAREN) 1 % GEL    Sig: Apply 2 g topically daily as needed (muscle pain).    Dispense:  150 g    Refill:  1   Insulin Pen Needle (DROPLET PEN NEEDLES) 31G X 8 MM MISC    Sig: USE WITH TOUJEO SOLOSTAR PEN EVERY DAY    Dispense:  100 each    Refill:  0   insulin glargine, 2 Unit Dial, (TOUJEO MAX SOLOSTAR) 300 UNIT/ML Solostar Pen    Sig: Inject 64 Units into the skin daily.    Dispense:  6 mL    Refill:  6   atorvastatin (LIPITOR) 10 MG tablet    Sig: Take 1 tablet (10 mg total) by mouth once daily.    Dispense:  90 tablet     Refill:  1   dapagliflozin propanediol (FARXIGA) 10 MG TABS tablet    Sig: Take 1 tablet (10 mg total) by mouth daily before breakfast.    Dispense:  90 tablet    Refill:  1   furosemide (LASIX) 40 MG tablet    Sig: Take 1 tablet (40 mg total) by mouth 2 (two) times daily.    Dispense:  180 tablet    Refill:  1   gabapentin (NEURONTIN) 300 MG capsule    Sig: Take orally 2 caps (600 mg) in the morning and 3 capsules (900 mg) in the evening    Dispense:  450 capsule    Refill:  1   lisinopril (ZESTRIL) 10 MG tablet    Sig: Take 1 tablet (10 mg total) by mouth daily.    Dispense:  90 tablet    Refill:  1   metFORMIN (GLUCOPHAGE-XR) 500 MG 24 hr tablet    Sig: Take 2 tablets (1,000 mg total) by mouth 2 (two) times daily.    Dispense:  360 tablet    Refill:  1   metoprolol tartrate (LOPRESSOR) 50 MG tablet    Sig: Take 1 tablet (50 mg total) by mouth 2 (two) times daily.    Dispense:  180 tablet    Refill:  1   pantoprazole (PROTONIX) 40 MG tablet    Sig: Take 1 tablet (40 mg total) by mouth 2 (two) times daily.    Dispense:  180 tablet    Refill:  1   Semaglutide,0.25 or 0.5MG /DOS, (OZEMPIC, 0.25 OR 0.5 MG/DOSE,) 2 MG/1.5ML SOPN    Sig: Inject 0.25 mg into the skin once a week.    Dispense:  2 mL    Refill:  6    Follow-up: Return in about  3 months (around 05/05/2023) for Chronic medical conditions.       Hoy Register, MD, FAAFP. [facility] [place], [place] [phone]   02/02/2023, 5:58 PM

## 2023-02-08 NOTE — Progress Notes (Signed)
Deborah Coffey, did we send a letter for the nodule follow up? If not you can do that and ensure annual scan is ordered.   Can we get a ECHO ordered and complete to eval for possible pulmonary hypertension prior to appt with [facility] so she can review the echo results with her.   Thanks,  BLI  Josephine Igo, DO [facility] 02/08/2023 3:32 PM

## 2023-02-09 ENCOUNTER — Encounter: Payer: Self-pay | Admitting: Emergency Medicine

## 2023-02-09 ENCOUNTER — Telehealth: Payer: Self-pay | Admitting: Pulmonary Disease

## 2023-02-09 DIAGNOSIS — R0609 Other forms of dyspnea: Secondary | ICD-10-CM

## 2023-02-09 DIAGNOSIS — Z122 Encounter for screening for malignant neoplasm of respiratory organs: Secondary | ICD-10-CM

## 2023-02-09 DIAGNOSIS — Z87891 Personal history of nicotine dependence: Secondary | ICD-10-CM

## 2023-02-09 NOTE — Telephone Encounter (Signed)
PT calling stating she is ret a call from someone who was calling her w/CT results, she said. Pls call @ [phone]

## 2023-02-10 NOTE — Telephone Encounter (Signed)
Lm x1 for patient.  

## 2023-02-10 NOTE — Telephone Encounter (Signed)
Patient is trying to return missed call.

## 2023-02-11 NOTE — Telephone Encounter (Signed)
Josephine Igo, DO 02/08/2023  3:33 PM EDT     Robynn Pane, did we send a letter for the nodule follow up? If not you can do that and ensure annual scan is ordered.   Can we get a ECHO ordered and complete to eval for possible pulmonary hypertension prior to appt with [facility] so she can review the echo results with her.   Thanks,   BLI   Josephine Igo, DO [facility] 02/08/2023 3:32 PM    Patient is aware of results and voiced her understanding.  She agrees with Echo. Order has been placed.  Routing to nodule pool to f/u on letter?

## 2023-02-14 ENCOUNTER — Encounter: Payer: Self-pay | Admitting: Emergency Medicine

## 2023-02-14 NOTE — Telephone Encounter (Signed)
Letter sent for patient relaying Lung-Rads 2 result. Annual scan ordered. Patient will need to have SDMV prior to next years LDCT. Referral order placed. LDCT ordered placed.

## 2023-02-25 ENCOUNTER — Ambulatory Visit (HOSPITAL_COMMUNITY)
Admission: RE | Admit: 2023-02-25 | Discharge: 2023-02-25 | Disposition: A | Discharge status: Home / Self Care | Payer: Medicare PPO | Source: Ambulatory Visit | Attending: Pulmonary Disease | Admitting: Pulmonary Disease

## 2023-02-25 DIAGNOSIS — E785 Hyperlipidemia, unspecified: Secondary | ICD-10-CM | POA: Insufficient documentation

## 2023-02-25 DIAGNOSIS — I361 Nonrheumatic tricuspid (valve) insufficiency: Secondary | ICD-10-CM | POA: Insufficient documentation

## 2023-02-25 DIAGNOSIS — R600 Localized edema: Secondary | ICD-10-CM | POA: Insufficient documentation

## 2023-02-25 DIAGNOSIS — I371 Nonrheumatic pulmonary valve insufficiency: Secondary | ICD-10-CM | POA: Diagnosis not present

## 2023-02-25 DIAGNOSIS — R0609 Other forms of dyspnea: Secondary | ICD-10-CM | POA: Diagnosis not present

## 2023-02-25 DIAGNOSIS — F17200 Nicotine dependence, unspecified, uncomplicated: Secondary | ICD-10-CM | POA: Insufficient documentation

## 2023-02-25 DIAGNOSIS — I2510 Atherosclerotic heart disease of native coronary artery without angina pectoris: Secondary | ICD-10-CM | POA: Insufficient documentation

## 2023-02-25 DIAGNOSIS — I110 Hypertensive heart disease with heart failure: Secondary | ICD-10-CM | POA: Insufficient documentation

## 2023-02-25 DIAGNOSIS — G4730 Sleep apnea, unspecified: Secondary | ICD-10-CM | POA: Diagnosis not present

## 2023-02-25 DIAGNOSIS — Z8673 Personal history of transient ischemic attack (TIA), and cerebral infarction without residual deficits: Secondary | ICD-10-CM | POA: Insufficient documentation

## 2023-02-25 DIAGNOSIS — E119 Type 2 diabetes mellitus without complications: Secondary | ICD-10-CM | POA: Insufficient documentation

## 2023-02-25 DIAGNOSIS — I272 Pulmonary hypertension, unspecified: Secondary | ICD-10-CM

## 2023-02-25 DIAGNOSIS — Z951 Presence of aortocoronary bypass graft: Secondary | ICD-10-CM | POA: Diagnosis not present

## 2023-02-25 DIAGNOSIS — I509 Heart failure, unspecified: Secondary | ICD-10-CM | POA: Diagnosis not present

## 2023-02-25 LAB — ECHOCARDIOGRAM COMPLETE
AR max vel: 2.19 cm2
AV Area VTI: 2.32 cm2
AV Area mean vel: 2.09 cm2
AV Mean grad: 2.5 mm[Hg]
AV Peak grad: 5.3 mm[Hg]
Ao pk vel: 1.15 m/s
Area-P 1/2: 4.21 cm2
S' Lateral: 2.8 cm

## 2023-03-01 NOTE — Telephone Encounter (Signed)
Care team updated and last eye exam visit

## 2023-03-03 ENCOUNTER — Encounter (HOSPITAL_COMMUNITY): Payer: Self-pay

## 2023-03-03 ENCOUNTER — Ambulatory Visit (HOSPITAL_COMMUNITY): Payer: Medicare PPO

## 2023-03-03 ENCOUNTER — Ambulatory Visit (HOSPITAL_COMMUNITY)
Admission: EM | Admit: 2023-03-03 | Discharge: 2023-03-03 | Disposition: A | Discharge status: Home / Self Care | Payer: Medicare PPO | Attending: Family Medicine | Admitting: Family Medicine

## 2023-03-03 DIAGNOSIS — R002 Palpitations: Secondary | ICD-10-CM | POA: Diagnosis not present

## 2023-03-03 DIAGNOSIS — R0600 Dyspnea, unspecified: Secondary | ICD-10-CM | POA: Insufficient documentation

## 2023-03-03 DIAGNOSIS — I10 Essential (primary) hypertension: Secondary | ICD-10-CM | POA: Insufficient documentation

## 2023-03-03 DIAGNOSIS — J069 Acute upper respiratory infection, unspecified: Secondary | ICD-10-CM | POA: Insufficient documentation

## 2023-03-03 DIAGNOSIS — R072 Precordial pain: Secondary | ICD-10-CM | POA: Insufficient documentation

## 2023-03-03 DIAGNOSIS — R0602 Shortness of breath: Secondary | ICD-10-CM | POA: Diagnosis not present

## 2023-03-03 LAB — CBC
HCT: 41.4 % (ref 36.0–46.0)
Hemoglobin: 13 g/dL (ref 12.0–15.0)
MCH: 28 pg (ref 26.0–34.0)
MCHC: 31.4 g/dL (ref 30.0–36.0)
MCV: 89.2 fL (ref 80.0–100.0)
Platelets: 282 10*3/uL (ref 150–400)
RBC: 4.64 MIL/uL (ref 3.87–5.11)
RDW: 15.9 % — ABNORMAL HIGH (ref 11.5–15.5)
WBC: 7 10*3/uL (ref 4.0–10.5)
nRBC: 0 % (ref 0.0–0.2)

## 2023-03-03 LAB — BASIC METABOLIC PANEL
Anion gap: 9 (ref 5–15)
BUN: 24 mg/dL — ABNORMAL HIGH (ref 8–23)
CO2: 24 mmol/L (ref 22–32)
Calcium: 9.3 mg/dL (ref 8.9–10.3)
Chloride: 107 mmol/L (ref 98–111)
Creatinine, Ser: 1.85 mg/dL — ABNORMAL HIGH (ref 0.44–1.00)
GFR, Estimated: 30 mL/min — ABNORMAL LOW (ref >60)
Glucose, Bld: 78 mg/dL (ref 70–99)
Potassium: 4.6 mmol/L (ref 3.5–5.1)
Sodium: 140 mmol/L (ref 135–145)

## 2023-03-03 LAB — POC COVID19/FLU A&B COMBO
Covid Antigen, POC: NEGATIVE
Influenza A Antigen, POC: NEGATIVE
Influenza B Antigen, POC: NEGATIVE

## 2023-03-03 MED ORDER — BENZONATATE 100 MG PO CAPS: 100.0000 mg | ORAL_CAPSULE | Freq: Three times a day (TID) | ORAL | 0 refills | Status: DC | PRN

## 2023-03-03 MED ORDER — NITROGLYCERIN 0.4 MG SL SUBL: 0.4000 mg | SUBLINGUAL_TABLET | SUBLINGUAL | 0 refills | Status: AC | PRN

## 2023-03-03 NOTE — ED Provider Notes (Addendum)
[facility]    CSN: [id number] Arrival date & time: 03/03/23  1141      History   Chief Complaint Chief Complaint  Patient presents with   Shortness of Breath   arm numbness   Palpitations    HPI Deborah Coffey is a 67 y.o. female.    Shortness of Breath Palpitations Associated symptoms: shortness of breath   Here for several symptoms.  For one, she has begun having productive cough with white sputum in the last few days and her shortness of breath has been going on for a year or more has been worse in the last 3 weeks.  She has not measured a fever at home but her temperature here is 99.4.  No vomiting or diarrhea  She has had some intermittent heart palpitations for 2 or 3 weeks.  Also in the last few weeks she has had some intermittent left arm pain.  Initially staff understood her to say numbness, but to me she states that she hurts in her left upper chest and it radiates into her left arm.  She had it last for a "while" last night, and had lasted longer than usual.  She denies other associated symptoms with this pain, but feels like her sugar drops.  She has not checked her sugar during these episodes.  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 competitive flow   TIA (transient ischemic attack)    Type II diabetes mellitus (HCC)     Patient Active Problem List   Diagnosis Date Noted   Gout 05/03/2022   Morbid obesity (HCC) 05/03/2022   Atherosclerotic  heart disease of native coronary artery with other forms of angina pectoris (HCC) 05/03/2022   Obstructive sleep apnea 08/31/2021   Other secondary pulmonary hypertension (HCC) 04/10/2021   Facial numbness 09/03/2016   Chronic diastolic CHF (congestive heart failure) (HCC) 09/03/2016   Anxiety    Eczema 01/29/2016   History of adenomatous polyp of colon 10/01/2015   Floaters in visual field 09/04/2015   SOB (shortness of breath) 06/26/2015   Screening for colon cancer 06/26/2015   Type 2 diabetes mellitus with complication, with long-term current use of insulin (HCC) 02/27/2015   Diabetic mononeuropathy associated with type 2 diabetes mellitus (HCC) 02/27/2015   LLQ abdominal pain    CAP (community acquired pneumonia) 08/07/2014   AKI (acute kidney injury) (HCC) 08/07/2014   Type 2 diabetes mellitus with hyperglycemia (HCC) 07/08/2014   Preventative health care 07/08/2014   Depression 07/08/2014   Acute upper respiratory infection 07/08/2014   Essential hypertension 01/10/2014   Depression 01/10/2014   H/O TIA (transient ischemic attack) and stroke 01/10/2014   Visual changes 01/02/2014   Facial weakness 01/02/2014   Conversion disorder 01/01/2014   Tobacco use 06/20/2013   Diabetic retinopathy (HCC) 04/03/2013   Essential hypertension, benign 02/14/2013   Accelerated hypertension 09/19/2012  Uncontrolled diabetes mellitus 09/19/2012   Dyslipidemia 09/19/2012   CAD (coronary artery disease) - CABG x 1 (LIMA-LAD) 02/21/2004    Past Surgical History:  Procedure Laterality Date   CARDIAC CATHETERIZATION  2010   Previous LAD 95% lesion - now ~30-40%; patent LIMA with competitive flow   CESAREAN SECTION  1977; 1989   CHOLECYSTECTOMY  ~ 2012   CORONARY ARTERY BYPASS GRAFT  02/2004   "CABG X1" (03/22/2013); LIMA-LAD   RIGHT/LEFT HEART CATH AND CORONARY/GRAFT ANGIOGRAPHY N/A 04/16/2021   Procedure: RIGHT/LEFT HEART CATH AND CORONARY/GRAFT ANGIOGRAPHY;  Surgeon: Corky Crafts, MD;  Location: [facility];  Service: Cardiovascular;  Laterality: N/A;   TUBAL LIGATION  1989    OB History   No obstetric history on file.      Home Medications    Prior to Admission medications   Medication Sig Start Date End Date Taking? Authorizing Provider  benzonatate (TESSALON) 100 MG capsule Take 1 capsule (100 mg total) by mouth 3 (three) times daily as needed for cough. 03/03/23  Yes Zenia Resides, MD  acetaminophen (TYLENOL) 500 MG tablet Take 1,000 mg by mouth every 6 (six) hours as needed for headache (pain).     [provider]  albuterol (VENTOLIN HFA) 108 (90 Base) MCG/ACT inhaler Inhale 2 puffs into the lungs every 6 (six) hours as needed for wheezing or shortness of breath. 12/29/22   Audie Box L, DO  Alcohol Swabs (B-D SINGLE USE SWABS REGULAR) PADS USE AS DIRECTED 04/13/17   Quentin Angst, MD  aspirin EC 81 MG tablet Take 81 mg by mouth daily.    [provider]  atorvastatin (LIPITOR) 10 MG tablet Take 1 tablet (10 mg total) by mouth once daily. 02/02/23   Hoy Register, MD  Blood Glucose Monitoring Suppl (ONETOUCH VERIO) w/Device KIT Use as instructed to test blood glucose 3 times daily. 02/05/22   Hoy Register, MD  clobetasol cream (TEMOVATE) 0.05 % Apply topically 2 (two) times daily. Patient taking differently: Apply 1 Application topically daily as needed (Rash). 12/09/21   Hoy Register, MD  Continuous Glucose Sensor (DEXCOM G6 SENSOR) MISC Use to check blood sugar three times daily. Dx. E11.59 02/02/23   Hoy Register, MD  dapagliflozin propanediol (FARXIGA) 10 MG TABS tablet Take 1 tablet (10 mg total) by mouth daily before breakfast. 02/02/23   Hoy Register, MD  diclofenac Sodium (VOLTAREN) 1 % GEL Apply 2 g topically daily as needed (muscle pain). 02/02/23   Hoy Register, MD  furosemide (LASIX) 40 MG tablet Take 1 tablet (40 mg total) by mouth 2 (two) times daily. 02/02/23   Hoy Register, MD   gabapentin (NEURONTIN) 300 MG capsule Take orally 2 caps (600 mg) in the morning and 3 capsules (900 mg) in the evening 02/02/23   Hoy Register, MD  glucose blood (ONETOUCH VERIO) test strip Use as instructed to test blood glucose 3 times daily. 02/05/22   Hoy Register, MD  insulin glargine, 2 Unit Dial, (TOUJEO MAX SOLOSTAR) 300 UNIT/ML Solostar Pen Inject 64 Units into the skin daily. 02/02/23   Hoy Register, MD  Insulin Pen Needle (DROPLET PEN NEEDLES) 31G X 8 MM MISC USE WITH TOUJEO SOLOSTAR PEN EVERY DAY 02/02/23   Hoy Register, MD  Lancets (ONETOUCH DELICA PLUS LANCET33G) MISC Use as instructed to test blood glucose 3 times daily. 02/05/22   Hoy Register, MD  lisinopril (ZESTRIL) 10 MG tablet Take 1 tablet (10 mg total) by mouth daily. 02/02/23  Hoy Register, MD  metFORMIN (GLUCOPHAGE-XR) 500 MG 24 hr tablet Take 2 tablets (1,000 mg total) by mouth 2 (two) times daily. 02/02/23   Hoy Register, MD  metoprolol tartrate (LOPRESSOR) 50 MG tablet Take 1 tablet (50 mg total) by mouth 2 (two) times daily. 02/02/23   Hoy Register, MD  Multiple Vitamin (MULTIVITAMIN WITH MINERALS) TABS tablet Take 1 tablet by mouth daily.    [provider]  nitroGLYCERIN (NITROSTAT) 0.4 MG SL tablet Place 1 tablet (0.4 mg total) under the tongue every 5 (five) minutes as needed for chest pain. 03/03/23   Zenia Resides, MD  pantoprazole (PROTONIX) 40 MG tablet Take 1 tablet (40 mg total) by mouth 2 (two) times daily. 02/02/23   Hoy Register, MD  Semaglutide,0.25 or 0.5MG /DOS, (OZEMPIC, 0.25 OR 0.5 MG/DOSE,) 2 MG/1.5ML SOPN Inject 0.25 mg into the skin once a week. 02/02/23   Hoy Register, MD    Family History Family History  Problem Relation Age of Onset   Heart disease Mother    Diabetes Mother    Cancer Mother        cervical   Diabetes Father    Diabetes Sister    Heart disease Sister    Lupus Sister    Heart failure Sister    Hepatitis C Sister     Hypertension Daughter    Diabetes Brother    Heart attack Brother    Heart failure Brother    Diabetes Brother    Colon cancer Neg Hx    Breast cancer Neg Hx     Social History Social History   Tobacco Use   Smoking status: Former    Current packs/day: 0.25    Average packs/day: 0.3 packs/day for 42.0 years (10.5 ttl pk-yrs)    Types: Cigarettes   Smokeless tobacco: Never   Tobacco comments:    Smokes 1-3 cigars/ black cigarettes a week. Agreed to referral to [place] quit line. Referral faxed. Patient given cessation information  Vaping Use   Vaping status: Some Days   Substances: Flavoring  Substance Use Topics   Alcohol use: Not Currently   Drug use: No    Types: Marijuana    Comment: smoked pot years ago but not currently     Allergies   Ciprofloxacin   Review of Systems Review of Systems  Respiratory:  Positive for shortness of breath.   Cardiovascular:  Positive for palpitations.     Physical Exam Triage Vital Signs ED Triage Vitals  Encounter Vitals Group     BP 03/03/23 1152 118/79     Systolic BP Percentile --      Diastolic BP Percentile --      Pulse Rate 03/03/23 1152 87     Resp 03/03/23 1152 16     Temp 03/03/23 1152 99.4 F (37.4 C)     Temp Source 03/03/23 1152 Oral     SpO2 03/03/23 1152 96 %     Weight --      Height --      Head Circumference --      Peak Flow --      Pain Score 03/03/23 1155 0     Pain Loc --      Pain Education --      Exclude from Growth Chart --    No data found.  Updated Vital Signs BP 118/79 (BP Location: Left Arm)   Pulse 87   Temp 99.4 F (37.4 C) (Oral)   Resp 16  LMP  (LMP Unknown)   SpO2 96%   Visual Acuity Right Eye Distance:   Left Eye Distance:   Bilateral Distance:    Right Eye Near:   Left Eye Near:    Bilateral Near:     Physical Exam Vitals reviewed.  Constitutional:      General: She is not in acute distress.    Appearance: She is not ill-appearing, toxic-appearing or diaphoretic.   HENT:     Nose: Nose normal.     Mouth/Throat:     Mouth: Mucous membranes are moist.     Pharynx: No oropharyngeal exudate or posterior oropharyngeal erythema.  Eyes:     Extraocular Movements: Extraocular movements intact.     Conjunctiva/sclera: Conjunctivae normal.     Pupils: Pupils are equal, round, and reactive to light.  Cardiovascular:     Rate and Rhythm: Normal rate and regular rhythm.     Heart sounds: No murmur heard. Pulmonary:     Effort: No respiratory distress.     Breath sounds: No stridor. No wheezing, rhonchi or rales.  Musculoskeletal:     Cervical back: Neck supple.  Lymphadenopathy:     Cervical: No cervical adenopathy.  Skin:    Capillary Refill: Capillary refill takes less than 2 seconds.     Coloration: Skin is not jaundiced or pale.  Neurological:     General: No focal deficit present.     Mental Status: She is alert and oriented to person, place, and time.  Psychiatric:        Behavior: Behavior normal.      UC Treatments / Results  Labs (all labs ordered are listed, but only abnormal results are displayed) Labs Reviewed  CBC  BASIC METABOLIC PANEL  POC COVID19/FLU A&B COMBO    EKG   Radiology No results found.  Procedures Procedures (including critical care time)  Medications Ordered in UC Medications - No data to display  Initial Impression / Assessment and Plan / UC Course  I have reviewed the triage vital signs and the nursing notes.  Pertinent labs & imaging results that were available during my care of the patient were reviewed by me and considered in my medical decision making (see chart for details).     Flu and COVID tests are negative. Chest x-ray is stable compared to an x-ray from September 2023.  She is advised of radiology overread.  EKG is unrevealing without any ST segment changes and according to cardiology reading it is stable from previous.  Since she is having some intermittent palpitations, BMP and CBC  are drawn today.  Will notify her of any significant abnormalities. I am concerned that she might be having some angina that she is describing at night.  Have given her the contact information for cardiology who she is seeing last time.  She is to call 911 if she has persistent chest pain.  Final Clinical Impressions(s) / UC Diagnoses   Final diagnoses:  Palpitations  Viral URI  Dyspnea, unspecified type  Precordial pain     Discharge Instructions      The flu and COVID test was negative  Your chest x-ray is unchanged from previous x-rays. The radiologist will also read your x-ray, and if their interpretation differs significantly from mine, we will call you.  The EKG is also without any serious changes  .  We have drawn blood to check your electrolytes and blood counts.  Staff will notify if anything is significantly different.  I  have sent you in some nitroglycerin pills to use if you have further chest pain.  You would take 1 and put it under your tongue every 5 minutes as needed for chest pain, up to 3 doses.  If your pain persists, that he should call 911 to be seen in the emergency room on an urgent basis.  Your general cardiologist is Dr. Anne Fu.  Please make a follow-up appointment with him.  Take benzonatate 100 mg, 1 tab every 8 hours as needed for cough.       ED Prescriptions     Medication Sig Dispense Auth. Provider   nitroGLYCERIN (NITROSTAT) 0.4 MG SL tablet Place 1 tablet (0.4 mg total) under the tongue every 5 (five) minutes as needed for chest pain. 25 tablet Kmarion Rawl, Janace Aris, MD   benzonatate (TESSALON) 100 MG capsule Take 1 capsule (100 mg total) by mouth 3 (three) times daily as needed for cough. 21 capsule Zenia Resides, MD      PDMP not reviewed this encounter.   Zenia Resides, MD 03/03/23 1300    Zenia Resides, MD 03/03/23 [phone]

## 2023-03-03 NOTE — Discharge Instructions (Addendum)
The flu and COVID test was negative  Your chest x-ray is unchanged from previous x-rays. The radiologist will also read your x-ray, and if their interpretation differs significantly from mine, we will call you.  The EKG is also without any serious changes  .  We have drawn blood to check your electrolytes and blood counts.  Staff will notify if anything is significantly different.  I have sent you in some nitroglycerin pills to use if you have further chest pain.  You would take 1 and put it under your tongue every 5 minutes as needed for chest pain, up to 3 doses.  If your pain persists, that he should call 911 to be seen in the emergency room on an urgent basis.  Your general cardiologist is Dr. Anne Fu.  Please make a follow-up appointment with him.  Take benzonatate 100 mg, 1 tab every 8 hours as needed for cough.

## 2023-03-03 NOTE — ED Triage Notes (Addendum)
Patient reports that she has had intermittent left arm numbness for weeks and states last night the numbness lasted longer.  Patient also states she has had intermittent heart palpitations x 2-3 weeks.  Patient reports SOB x 1 year and states has become worse in the past 3 weeks.  Patient c/o a productive cough with white sputum x 2 days.   Patient states she has been taking Tylenol cold and Sinus.

## 2023-03-06 ENCOUNTER — Emergency Department (HOSPITAL_COMMUNITY)
Admission: EM | Admit: 2023-03-06 | Discharge: 2023-03-07 | Disposition: A | Discharge status: Home / Self Care | Payer: Medicare PPO | Attending: Emergency Medicine | Admitting: Emergency Medicine

## 2023-03-06 ENCOUNTER — Encounter (HOSPITAL_COMMUNITY): Payer: Self-pay | Admitting: Emergency Medicine

## 2023-03-06 DIAGNOSIS — E1122 Type 2 diabetes mellitus with diabetic chronic kidney disease: Secondary | ICD-10-CM | POA: Diagnosis not present

## 2023-03-06 DIAGNOSIS — Z7982 Long term (current) use of aspirin: Secondary | ICD-10-CM | POA: Insufficient documentation

## 2023-03-06 DIAGNOSIS — R799 Abnormal finding of blood chemistry, unspecified: Secondary | ICD-10-CM | POA: Diagnosis present

## 2023-03-06 DIAGNOSIS — I129 Hypertensive chronic kidney disease with stage 1 through stage 4 chronic kidney disease, or unspecified chronic kidney disease: Secondary | ICD-10-CM | POA: Insufficient documentation

## 2023-03-06 DIAGNOSIS — N289 Disorder of kidney and ureter, unspecified: Secondary | ICD-10-CM | POA: Diagnosis not present

## 2023-03-06 DIAGNOSIS — N1832 Chronic kidney disease, stage 3b: Secondary | ICD-10-CM | POA: Insufficient documentation

## 2023-03-06 DIAGNOSIS — Z7984 Long term (current) use of oral hypoglycemic drugs: Secondary | ICD-10-CM | POA: Diagnosis not present

## 2023-03-06 DIAGNOSIS — Z794 Long term (current) use of insulin: Secondary | ICD-10-CM | POA: Diagnosis not present

## 2023-03-06 DIAGNOSIS — Z79899 Other long term (current) drug therapy: Secondary | ICD-10-CM | POA: Diagnosis not present

## 2023-03-06 DIAGNOSIS — Z951 Presence of aortocoronary bypass graft: Secondary | ICD-10-CM | POA: Diagnosis not present

## 2023-03-06 DIAGNOSIS — I2510 Atherosclerotic heart disease of native coronary artery without angina pectoris: Secondary | ICD-10-CM | POA: Diagnosis not present

## 2023-03-06 LAB — CBC
HCT: 42.3 % (ref 36.0–46.0)
Hemoglobin: 12.9 g/dL (ref 12.0–15.0)
MCH: 28.2 pg (ref 26.0–34.0)
MCHC: 30.5 g/dL (ref 30.0–36.0)
MCV: 92.6 fL (ref 80.0–100.0)
Platelets: 279 10*3/uL (ref 150–400)
RBC: 4.57 MIL/uL (ref 3.87–5.11)
RDW: 16 % — ABNORMAL HIGH (ref 11.5–15.5)
WBC: 6.9 10*3/uL (ref 4.0–10.5)
nRBC: 0 % (ref 0.0–0.2)

## 2023-03-06 LAB — COMPREHENSIVE METABOLIC PANEL
ALT: 29 U/L (ref 0–44)
AST: 34 U/L (ref 15–41)
Albumin: 3.1 g/dL — ABNORMAL LOW (ref 3.5–5.0)
Alkaline Phosphatase: 144 U/L — ABNORMAL HIGH (ref 38–126)
Anion gap: 10 (ref 5–15)
BUN: 28 mg/dL — ABNORMAL HIGH (ref 8–23)
CO2: 23 mmol/L (ref 22–32)
Calcium: 9.1 mg/dL (ref 8.9–10.3)
Chloride: 107 mmol/L (ref 98–111)
Creatinine, Ser: 1.69 mg/dL — ABNORMAL HIGH (ref 0.44–1.00)
GFR, Estimated: 33 mL/min — ABNORMAL LOW (ref >60)
Glucose, Bld: 82 mg/dL (ref 70–99)
Potassium: 5 mmol/L (ref 3.5–5.1)
Sodium: 140 mmol/L (ref 135–145)
Total Bilirubin: 0.3 mg/dL (ref <1.2)
Total Protein: 6.8 g/dL (ref 6.5–8.1)

## 2023-03-06 NOTE — ED Triage Notes (Signed)
Pt here from home called to com eto the ED for possible IV fluids , due to creatinine of 1.8 up from 1.4 ,

## 2023-03-07 ENCOUNTER — Other Ambulatory Visit: Payer: Self-pay

## 2023-03-07 NOTE — ED Provider Notes (Signed)
[place] [facility] AT [facility] Provider Note   CSN: [id number] Arrival date & time: 03/06/23  1707     History  Chief complaint-abnormal labs  Deborah Coffey is a 67 y.o. female.  The history is provided by the patient and a relative.  Patient history of CAD, hypertension, diabetes presents with abnormal labs.  Patient reports she was at home watching television when she received a phone call from a nurse told her to go to ER for abnormal kidney function. She was seen in urgent care a few days ago and had labs drawn at that time  Patient has cough but no other acute complaints.  No chest pain or shortness of breath.  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 competitive flow   TIA (transient ischemic attack)    Type II diabetes mellitus (HCC)     Home Medications Prior to Admission medications   Medication Sig Start Date End Date Taking? Authorizing Provider  acetaminophen (TYLENOL) 500 MG tablet Take 1,000 mg by mouth every 6 (six) hours as needed for headache (pain).     [provider]  albuterol (VENTOLIN HFA) 108 (90 Base) MCG/ACT inhaler Inhale 2 puffs into the lungs every 6 (six) hours as needed for wheezing or shortness of breath. 12/29/22   Audie Box L, DO  Alcohol Swabs (B-D SINGLE USE SWABS REGULAR) PADS USE AS DIRECTED 04/13/17   Quentin Angst, MD  aspirin EC 81 MG tablet Take 81  mg by mouth daily.    [provider]  atorvastatin (LIPITOR) 10 MG tablet Take 1 tablet (10 mg total) by mouth once daily. 02/02/23   Hoy Register, MD  Blood Glucose Monitoring Suppl (ONETOUCH VERIO) w/Device KIT Use as instructed to test blood glucose 3 times daily. 02/05/22   Hoy Register, MD  clobetasol cream (TEMOVATE) 0.05 % Apply topically 2 (two) times daily. Patient taking differently: Apply 1 Application topically daily as needed (Rash). 12/09/21   Hoy Register, MD  Continuous Glucose Sensor (DEXCOM G6 SENSOR) MISC Use to check blood sugar three times daily. Dx. E11.59 02/02/23   Hoy Register, MD  dapagliflozin propanediol (FARXIGA) 10 MG TABS tablet Take 1 tablet (10 mg total) by mouth daily before breakfast. 02/02/23   Hoy Register, MD  diclofenac Sodium (VOLTAREN) 1 % GEL Apply 2 g topically daily as needed (muscle pain). 02/02/23   Hoy Register, MD  furosemide (LASIX) 40 MG tablet Take 1 tablet (40 mg total) by mouth 2 (two) times daily. 02/02/23   Hoy Register, MD  gabapentin (NEURONTIN) 300 MG capsule Take orally 2 caps (600 mg) in the morning and 3 capsules (900 mg) in the evening 02/02/23   Hoy Register, MD  glucose blood (ONETOUCH VERIO) test strip Use as instructed to test blood glucose 3 times  daily. 02/05/22   Hoy Register, MD  insulin glargine, 2 Unit Dial, (TOUJEO MAX SOLOSTAR) 300 UNIT/ML Solostar Pen Inject 64 Units into the skin daily. 02/02/23   Hoy Register, MD  Insulin Pen Needle (DROPLET PEN NEEDLES) 31G X 8 MM MISC USE WITH TOUJEO SOLOSTAR PEN EVERY DAY 02/02/23   Hoy Register, MD  Lancets (ONETOUCH DELICA PLUS LANCET33G) MISC Use as instructed to test blood glucose 3 times daily. 02/05/22   Hoy Register, MD  lisinopril (ZESTRIL) 10 MG tablet Take 1 tablet (10 mg total) by mouth daily. 02/02/23   Hoy Register, MD  metFORMIN (GLUCOPHAGE-XR) 500 MG 24 hr tablet Take 2 tablets (1,000 mg total) by mouth 2 (two) times daily.  02/02/23   Hoy Register, MD  metoprolol tartrate (LOPRESSOR) 50 MG tablet Take 1 tablet (50 mg total) by mouth 2 (two) times daily. 02/02/23   Hoy Register, MD  Multiple Vitamin (MULTIVITAMIN WITH MINERALS) TABS tablet Take 1 tablet by mouth daily.    [provider]  nitroGLYCERIN (NITROSTAT) 0.4 MG SL tablet Place 1 tablet (0.4 mg total) under the tongue every 5 (five) minutes as needed for chest pain. 03/03/23   Zenia Resides, MD  pantoprazole (PROTONIX) 40 MG tablet Take 1 tablet (40 mg total) by mouth 2 (two) times daily. 02/02/23   Hoy Register, MD  Semaglutide,0.25 or 0.5MG /DOS, (OZEMPIC, 0.25 OR 0.5 MG/DOSE,) 2 MG/1.5ML SOPN Inject 0.25 mg into the skin once a week. 02/02/23   Hoy Register, MD      Allergies    Ciprofloxacin    Review of Systems   Review of Systems  Respiratory:  Positive for cough. Negative for shortness of breath.   Cardiovascular:  Negative for chest pain.  Gastrointestinal:  Negative for diarrhea and vomiting.  Genitourinary:  Negative for decreased urine volume and dysuria.    Physical Exam Updated Vital Signs BP 106/69 (BP Location: Left Arm)   Pulse 87   Temp 98.6 F (37 C)   Resp 19   LMP  (LMP Unknown)   SpO2 90%  Physical Exam recheck pulse ox 97% CONSTITUTIONAL: Well developed/well nourished HEAD: Normocephalic/atraumatic CV: S1/S2 noted, no murmurs/rubs/gallops noted LUNGS: Lungs are clear to auscultation bilaterally, no apparent distress ABDOMEN: soft, NEURO: Pt is awake/alert/appropriate, moves all extremitiesx4.   EXTREMITIES: pulses normal/equal, full ROM SKIN: warm, color normal   ED Results / Procedures / Treatments   Labs (all labs ordered are listed, but only abnormal results are displayed) Labs Reviewed  CBC - Abnormal; Notable for the following components:      Result Value   RDW 16.0 (*)    All other components within normal limits  COMPREHENSIVE METABOLIC PANEL - Abnormal; Notable for the  following components:   BUN 28 (*)    Creatinine, Ser 1.69 (*)    Albumin 3.1 (*)    Alkaline Phosphatase 144 (*)    GFR, Estimated 33 (*)    All other components within normal limits    EKG None  Radiology No results found.  Procedures Procedures    Medications Ordered in ED Medications - No data to display  ED Course/ Medical Decision Making/ A&P Clinical Course as of 03/07/23 0011  Wynelle Link Mar 06, 2023  2353 Creatinine(!): 1.69 Renal insufficiency [DW]  Mon Mar 07, 2023  0009 Patient reports she was at home in no distress received a phone call from a nurse in urgent care they told her her kidney numbers were off.  Today her creatinine is  1.6 which is improved from several days ago.  Patient was seen in the urgent care a few days ago.  Patient has had no vomiting or diarrhea.  No new chest pain or shortness of breath.  She is in no acute distress [DW]  0010 Medication review reveals patient is on lisinopril.  I have asked patient and her daughter to hold this medication for 1 week and have a recheck of her kidney numbers with her PCP [DW]    Clinical Course User Index [DW] Zadie Rhine, MD                                 Medical Decision Making Amount and/or Complexity of Data Reviewed Labs: ordered. Decision-making details documented in ED Course.   Labs have been reviewed which reveal essentially chronic renal insufficiency. Potassium is at 5, no other abnormal findings. Will have patient hold lisinopril for 1 week and have a recheck with her PCP No indication for IV fluids or further workup at this time        Final Clinical Impression(s) / ED Diagnoses Final diagnoses:  Renal insufficiency    Rx / DC Orders ED Discharge Orders     None         Zadie Rhine, MD 03/07/23 0013

## 2023-03-07 NOTE — Discharge Instructions (Addendum)
Please increase your fluid intake over the next several days Stop the medication lisinopril for 1 week. Follow-up with your doctor next week for recheck of your kidney numbers

## 2023-03-07 NOTE — ED Notes (Signed)
Pt in NAD at d/c from ED. A&O. Ambulatory. Respirations even & unlabored. Skin warm & dry. Pt verbalized understanding of d/c teaching including follow up care, medications and reasons to return to the ED. No needs or questions expressed at d/c.

## 2023-03-11 NOTE — Telephone Encounter (Signed)
NFN

## 2023-03-21 ENCOUNTER — Ambulatory Visit: Payer: Medicare PPO | Admitting: Pulmonary Disease

## 2023-03-21 DIAGNOSIS — F17200 Nicotine dependence, unspecified, uncomplicated: Secondary | ICD-10-CM | POA: Diagnosis not present

## 2023-03-21 LAB — PULMONARY FUNCTION TEST
DL/VA % pred: 90 %
DL/VA: 3.74 ml/min/mmHg/L
DLCO cor % pred: 48 %
DLCO cor: 9.95 ml/min/mmHg
DLCO unc % pred: 47 %
DLCO unc: 9.8 ml/min/mmHg
FEF 25-75 Post: 1.22 L/s
FEF 25-75 Pre: 0.7 L/s
FEF2575-%Change-Post: 74 %
FEF2575-%Pred-Post: 55 %
FEF2575-%Pred-Pre: 32 %
FEV1-%Change-Post: 12 %
FEV1-%Pred-Post: 56 %
FEV1-%Pred-Pre: 50 %
FEV1-Post: 1.43 L
FEV1-Pre: 1.27 L
FEV1FVC-%Change-Post: 11 %
FEV1FVC-%Pred-Pre: 89 %
FEV6-%Change-Post: 3 %
FEV6-%Pred-Post: 59 %
FEV6-%Pred-Pre: 56 %
FEV6-Post: 1.86 L
FEV6-Pre: 1.79 L
FEV6FVC-%Change-Post: 1 %
FEV6FVC-%Pred-Post: 104 %
FEV6FVC-%Pred-Pre: 102 %
FVC-%Change-Post: 1 %
FVC-%Pred-Post: 56 %
FVC-%Pred-Pre: 55 %
FVC-Post: 1.86 L
FVC-Pre: 1.84 L
Post FEV1/FVC ratio: 77 %
Post FEV6/FVC ratio: 100 %
Pre FEV1/FVC ratio: 69 %
Pre FEV6/FVC Ratio: 98 %
RV % pred: 87 %
RV: 1.86 L
TLC % pred: 70 %
TLC: 3.68 L

## 2023-03-21 NOTE — Progress Notes (Signed)
Full PFT performed today. °

## 2023-03-21 NOTE — Patient Instructions (Signed)
Full PFT performed today. °

## 2023-03-23 ENCOUNTER — Ambulatory Visit (INDEPENDENT_AMBULATORY_CARE_PROVIDER_SITE_OTHER): Payer: Medicare PPO | Admitting: Nurse Practitioner

## 2023-03-23 ENCOUNTER — Encounter: Payer: Self-pay | Admitting: Nurse Practitioner

## 2023-03-23 VITALS — BP 122/70 | HR 88 | Ht 65.0 in | Wt 234.0 lb

## 2023-03-23 DIAGNOSIS — F17210 Nicotine dependence, cigarettes, uncomplicated: Secondary | ICD-10-CM

## 2023-03-23 DIAGNOSIS — J4530 Mild persistent asthma, uncomplicated: Secondary | ICD-10-CM

## 2023-03-23 DIAGNOSIS — I272 Pulmonary hypertension, unspecified: Secondary | ICD-10-CM | POA: Diagnosis not present

## 2023-03-23 DIAGNOSIS — I5032 Chronic diastolic (congestive) heart failure: Secondary | ICD-10-CM | POA: Diagnosis not present

## 2023-03-23 DIAGNOSIS — R911 Solitary pulmonary nodule: Secondary | ICD-10-CM

## 2023-03-23 DIAGNOSIS — J84115 Respiratory bronchiolitis interstitial lung disease: Secondary | ICD-10-CM | POA: Diagnosis not present

## 2023-03-23 DIAGNOSIS — Z720 Tobacco use: Secondary | ICD-10-CM

## 2023-03-23 DIAGNOSIS — J302 Other seasonal allergic rhinitis: Secondary | ICD-10-CM

## 2023-03-23 DIAGNOSIS — R0609 Other forms of dyspnea: Secondary | ICD-10-CM

## 2023-03-23 MED ORDER — SYMBICORT 80-4.5 MCG/ACT IN AERO: 2.0000 | INHALATION_SPRAY | Freq: Two times a day (BID) | RESPIRATORY_TRACT | 5 refills | Status: DC

## 2023-03-23 MED ORDER — BUPROPION HCL ER (SR) 150 MG PO TB12: 150.0000 mg | ORAL_TABLET | Freq: Two times a day (BID) | ORAL | 5 refills | Status: DC

## 2023-03-23 NOTE — Patient Instructions (Addendum)
Continue Albuterol inhaler 2 puffs or 3 mL neb every 6 hours as needed for shortness of breath or wheezing. Notify if symptoms persist despite rescue inhaler/neb use.  Trial Symbicort 2 puffs Twice daily. Brush tongue and rinse mouth afterwards. We will see if this helps with your breathing  We will check some allergy testing on you   Your lung function testing showed a moderate to severe restriction in your lung function, which is likely due to volume overload and possibly a component of your weight too. You also have worsening right heart strain and elevated pulmonary artery pressure. I may adjust your lasix after we get your labs back  You need to go back to see Dr. Gala Romney to discuss your pulmonary hypertension - call his office to make an appt [phone]  Walk test today   Overnight oxygen study - someone will contact you for scheduling   Labs today   Start Wellbutrin - 1 tab (150 mg) daily for 3 days then increase to 1 tab twice daily. Notify of any mood changes immediately. Do not drink alcohol while you are on this. This should help with quitting smoking. Gradually cut back with aim quit date in 4-6 weeks   Follow up in 4-6 weeks with Dr. Tonia Brooms or Philis Nettle. If symptoms do not improve or worsen, please contact office for sooner follow up or seek emergency care.

## 2023-03-23 NOTE — Progress Notes (Signed)
@Patient  ID: Deborah Coffey, female    DOB: [birth year], 67 y.o.   MRN: [id number]  Chief Complaint  Patient presents with   Follow-up    PFT F/U visit    Referring provider: Hoy Register, MD  HPI: 67 year old female, active smoker followed for lung nodules and DOE. She is a patient of Dr. Myrlene Broker and last seen in office 12/29/2022. Past medical history significant for CHF, PH, HTN, CAD s/p CABG, DM insulin dependent, eczema, anxiety, depression, HLD, hx of TIA.  TEST/EVENTS:  04/16/2021 cardiac cath: no significant obstructive disease. LV function nl. Moderate pulm HTN.  01/13/2023 CT chest nodule follow-up: Mild cardiomegaly with prior CABG.  Pulmonary artery enlargement.  Mild centrilobular emphysema.  Diffuse upper lobe predominant ill-defined micro nodularity, smoking-related respiratory bronchiolitis.  Right upper lobe pulmonary nodule on prior exam has resolved.  Other pulmonary nodules not significantly changed, up to 4.3 mm.  Lung RADS 2. 02/25/2023 echo: EF 60-65%. Interventricular septum flattening, consistent with volume overload. RV function moderately reduced and severely enlarged. Severely elevated PASP. RA severely dilated. Moderate TR. PR moderate.  03/21/2023 PFT: FVC 55, FEV1 50, ratio 77, TLC 70, DLCOcor 48. Moderately severe restriction with reversibility (12% change) and severe diffusing defect   12/29/2022: OV with Dr. Tonia Brooms. Former smoker with small pack year hx. CT chest lung nodule follow up that was completed 06/23/2022. Referred for evaluation having waxing and waning nodules and a new solid nodule in RUL, measuring 5.4 mm. Repeat CT chest ordered. Heavily counseled on smoking cessation. Given albuterol to trial for DOE. Will obtain PFT.   03/23/2023: Today - follow up Patient presents today for follow up after PFT which revealed a moderate restrictive defect with moderately severe diffusing defect. She did have some reversibility in her lung function following  administration of bronchodilators; however, no formal obstruction based on FEV1/FVC ratio >70. She has used the albuterol but it is infrequent. Maybe 1-2 times a month. She's not sure if it helps or not. She does get winded when she first starts walking but once she's up and moving, she tends to feel better. She still has to rest after longer distances or uphill climbing. She does not currently have a cough but tends to get one with changes of the season, especially the fall and spring. She does have allergy symptoms, especially in the fall, with sinus congestion/drainage. She does not take a daily allergy pill. She does not have a history of asthma but has had issues with bronchitis in the past. She has not required any treatment recently for her breathing or cough with steroids or antibiotics. She denies fevers, chills, wheezing, chest congestion, hemoptysis.  She had an enlarged pulmonary artery on her CT scan and evidence of smoking related respiratory bronchiolitis. No IPF. She is an active smoker; 6 cigarettes a day. She does have a known history of pulmonary hypertension and CHF. She is followed by Dr. Gala Romney but has not been seen in quite some time. She had a sleep study in 2023, which revealed very mild sleep apnea; unlikely cause of her PH. She does have some occasional leg swelling, usually in her ankles. She has not noticed any recent weight gain at home but dry weight is usually 230 lb. No orthopnea, PND, CP.   Allergies  Allergen Reactions   Ciprofloxacin Rash    Immunization History  Administered Date(s) Administered   Influenza,inj,Quad PF,6+ Mos 03/28/2015, 02/18/2019, 04/27/2021, 01/20/2022   PNEUMOCOCCAL CONJUGATE-20 04/27/2021   Pneumococcal Polysaccharide-23  01/31/2018, 06/23/2018   Zoster Recombinant(Shingrix) 06/13/2019    Past Medical History:  Diagnosis Date   Allergy    Anemia    CAD in native artery 02/18/2004   Mid LAD lesoin just after Large D1 --> CABG x 1  LIMA-LAD   CAP (community acquired pneumonia) 08/07/2014   Chronic kidney disease, stage 3b (HCC)    Depression    High cholesterol    History of blood transfusion 04/20/2003   "related to [facility]"    Hypertension    Lung nodules    Mild carotid artery disease (HCC)    Pulmonary hypertension (HCC)    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 competitive flow   TIA (transient ischemic attack)    Type II diabetes mellitus (HCC)     Tobacco History: Social History   Tobacco Use  Smoking Status Former   Current packs/day: 0.25   Average packs/day: 0.3 packs/day for 42.0 years (10.5 ttl pk-yrs)   Types: Cigarettes  Smokeless Tobacco Never  Tobacco Comments   Smokes 1-3 cigars/ black cigarettes a week. Agreed to referral to [facility]. Referral faxed. Patient given cessation information   Counseling given: Not Answered Tobacco comments: Smokes 1-3 cigars/ black cigarettes a week. Agreed to referral to [facility] quit line. Referral faxed. Patient given cessation information   Outpatient Medications Prior to Visit  Medication Sig Dispense Refill   acetaminophen (TYLENOL) 500 MG tablet Take 1,000 mg by mouth every 6 (six) hours as needed for headache (pain).      albuterol (VENTOLIN HFA) 108 (90 Base) MCG/ACT inhaler Inhale 2 puffs into the lungs every 6 (six) hours as needed for wheezing or shortness of breath. 8 g 6   Alcohol Swabs (B-D SINGLE USE SWABS REGULAR) PADS USE AS DIRECTED 100 each 0   aspirin EC 81 MG tablet Take 81 mg by mouth daily.     atorvastatin (LIPITOR) 10 MG tablet Take 1 tablet (10 mg total) by mouth once daily. 90 tablet 1   Blood Glucose Monitoring Suppl (ONETOUCH VERIO) w/Device KIT Use as instructed to test blood glucose 3 times daily. 1 kit 0   clobetasol cream (TEMOVATE) 0.05 % Apply topically 2 (two) times daily. (Patient taking differently: Apply 1 Application topically daily as needed (Rash).) 60 g 0   Continuous  Glucose Sensor (DEXCOM G6 SENSOR) MISC Use to check blood sugar three times daily. Dx. E11.59 (Patient not taking: Reported on 03/24/2023) 3 each 3   dapagliflozin propanediol (FARXIGA) 10 MG TABS tablet Take 1 tablet (10 mg total) by mouth daily before breakfast. 90 tablet 1   diclofenac Sodium (VOLTAREN) 1 % GEL Apply 2 g topically daily as needed (muscle pain). 150 g 1   gabapentin (NEURONTIN) 300 MG capsule Take orally 2 caps (600 mg) in the morning and 3 capsules (900 mg) in the evening 450 capsule 1   glucose blood (ONETOUCH VERIO) test strip Use as instructed to test blood glucose 3 times daily. 100 each 3   insulin glargine, 2 Unit Dial, (TOUJEO MAX SOLOSTAR) 300 UNIT/ML Solostar Pen Inject 64 Units into the skin daily. 6 mL 6   Insulin Pen Needle (DROPLET PEN NEEDLES) 31G X 8 MM MISC USE WITH TOUJEO SOLOSTAR PEN EVERY DAY 100 each 0   Lancets (ONETOUCH DELICA PLUS LANCET33G) MISC Use as instructed to test blood glucose 3 times daily. 100 each 3   metFORMIN (GLUCOPHAGE-XR) 500 MG 24 hr  tablet Take 2 tablets (1,000 mg total) by mouth 2 (two) times daily. 360 tablet 1   metoprolol tartrate (LOPRESSOR) 50 MG tablet Take 1 tablet (50 mg total) by mouth 2 (two) times daily. 180 tablet 1   Multiple Vitamin (MULTIVITAMIN WITH MINERALS) TABS tablet Take 1 tablet by mouth daily.     nitroGLYCERIN (NITROSTAT) 0.4 MG SL tablet Place 1 tablet (0.4 mg total) under the tongue every 5 (five) minutes as needed for chest pain. 25 tablet 0   pantoprazole (PROTONIX) 40 MG tablet Take 1 tablet (40 mg total) by mouth 2 (two) times daily. (Patient taking differently: Take 40 mg by mouth 2 (two) times daily. As needed) 180 tablet 1   Semaglutide,0.25 or 0.5MG /DOS, (OZEMPIC, 0.25 OR 0.5 MG/DOSE,) 2 MG/1.5ML SOPN Inject 0.25 mg into the skin once a week. (Patient taking differently: Inject 0.25 mg into the skin once a week. Thursadys) 2 mL 6   furosemide (LASIX) 40 MG tablet Take 1 tablet (40 mg total) by mouth 2 (two)  times daily. 180 tablet 1   lisinopril (ZESTRIL) 10 MG tablet Take 1 tablet (10 mg total) by mouth daily. 90 tablet 1   No facility-administered medications prior to visit.     Review of Systems:   Constitutional: No weight loss or gain, night sweats, fevers, chills, or lassitude. +fatigue  HEENT: No headaches, difficulty swallowing, tooth/dental problems, or sore throat. No sneezing, itching, ear ache. +seasonal allergies with nasal congestion CV:  +swelling in lower extremities. No chest pain, orthopnea, PND, anasarca, dizziness, palpitations, syncope Resp: +shortness of breath with exertion. No excess mucus or change in color of mucus. No productive or non-productive. No hemoptysis. No wheezing.  No chest wall deformity GI:  No heartburn, indigestion, abdominal pain, nausea, vomiting, diarrhea, change in bowel habits, loss of appetite, bloody stools.  GU: No dysuria, change in color of urine, urgency or frequency.  No flank pain, no hematuria  Skin: No rash, lesions, ulcerations MSK:  No joint pain or swelling.  No decreased range of motion.  No back pain. Neuro: No dizziness or lightheadedness.  Psych: No depression or anxiety. Mood stable.     Physical Exam:  BP 122/70 (BP Location: Right Arm, Cuff Size: Large)   Pulse 88   Ht 5\' 5"  (1.651 m)   Wt 234 lb (106.1 kg)   LMP  (LMP Unknown)   SpO2 95%   BMI 38.94 kg/m   GEN: Pleasant, interactive, well-appearing; obese; in no acute distress HEENT:  Normocephalic and atraumatic. PERRLA. Sclera white. Nasal turbinates pink, moist and patent bilaterally. No rhinorrhea present. Oropharynx pink and moist, without exudate or edema. No lesions, ulcerations, or postnasal drip.  NECK:  Supple w/ fair ROM. No JVD present. Normal carotid impulses w/o bruits. Thyroid symmetrical with no goiter or nodules palpated. No lymphadenopathy.   CV: RRR, no m/r/g, +1 BLE pedal edema. Pulses intact, +2 bilaterally. No cyanosis, pallor or  clubbing. PULMONARY:  Unlabored, regular breathing. Clear bilaterally A&P w/o wheezes/rales/rhonchi. No accessory muscle use.  GI: BS present and normoactive. Soft, non-tender to palpation. No organomegaly or masses detected.  MSK: No erythema, warmth or tenderness. Cap refil <2 sec all extrem. No deformities or joint swelling noted.  Neuro: A/Ox3. No focal deficits noted.   Skin: Warm, no lesions or rashe Psych: Normal affect and behavior. Judgement and thought content appropriate.     Lab Results:  CBC    Component Value Date/Time   WBC 6.9 03/06/2023 1719  RBC 4.57 03/06/2023 1719   HGB 12.9 03/06/2023 1719   HGB 12.1 04/10/2021 1248   HCT 42.3 03/06/2023 1719   HCT 37.0 04/10/2021 1248   PLT 279 03/06/2023 1719   PLT 261 04/10/2021 1248   MCV 92.6 03/06/2023 1719   MCV 83 04/10/2021 1248   MCH 28.2 03/06/2023 1719   MCHC 30.5 03/06/2023 1719   RDW 16.0 (H) 03/06/2023 1719   RDW 13.7 04/10/2021 1248   LYMPHSABS 1.5 12/18/2021 1230   LYMPHSABS 1.9 01/21/2021 1618   MONOABS 0.7 12/18/2021 1230   EOSABS 0.1 12/18/2021 1230   EOSABS 0.1 01/21/2021 1618   BASOSABS 0.0 12/18/2021 1230   BASOSABS 0.0 01/21/2021 1618    BMET    Component Value Date/Time   NA 142 03/24/2023 1144   NA 140 11/02/2022 1500   K 4.4 03/24/2023 1144   CL 105 03/24/2023 1144   CO2 27 03/24/2023 1144   GLUCOSE 52 (L) 03/24/2023 1144   BUN 22 03/24/2023 1144   BUN 28 (H) 11/02/2022 1500   CREATININE 1.51 (H) 03/24/2023 1144   CREATININE 1.02 02/27/2015 1236   CALCIUM 9.7 03/24/2023 1144   GFRNONAA 38 (L) 03/24/2023 1144   GFRNONAA 61 02/27/2015 1236   GFRAA 46 (L) 10/04/2019 0940   GFRAA 71 02/27/2015 1236    BNP    Component Value Date/Time   BNP 810.9 (H) 03/24/2023 1144     Imaging:  DG Chest 2 View  Result Date: 03/03/2023 CLINICAL DATA:  Shortness of breath. EXAM: CHEST - 2 VIEW COMPARISON:  December 18, 2021. FINDINGS: Stable cardiomediastinal silhouette. Sternotomy wires  are noted. Both lungs are clear. The visualized skeletal structures are unremarkable. IMPRESSION: No active cardiopulmonary disease. Electronically Signed   By: Lupita Raider M.D.   On: 03/03/2023 15:50   ECHOCARDIOGRAM COMPLETE  Result Date: 02/25/2023    ECHOCARDIOGRAM REPORT   Patient Name:   Deborah Coffey Date of Exam: 02/25/2023 Medical Rec #:  [id number]          Height:       65.0 in Accession #:    [id number]         Weight:       234.4 lb Date of Birth:  Oct 26, 1955         BSA:          2.116 m Patient Age:    66 years           BP:           105/66 mmHg Patient Gender: F                  HR:           92 bpm. Exam Location:  Outpatient Procedure: 2D Echo, Cardiac Doppler and Color Doppler Indications:    I27.20 Pulmonary Hypertension; R06.9 DOE; R60.0 Lower extremity                 edema  History:        Patient has prior history of Echocardiogram examinations, most                 recent 03/26/2021. CHF, CAD, Prior CABG, Pulmonary HTN and TIA,                 Signs/Symptoms:Dyspnea and Edema; Risk Factors:Hypertension,                 Sleep Apnea, Diabetes, Dyslipidemia and Current Smoker. Patient  denies chest pain. She does have DOE with leg edema.  Sonographer:    Carlos American RVT, RDCS (AE), RDMS Referring Phys: [id number] BRADLEY L ICARD  Sonographer Comments: Suboptimal apical window and patient is obese. Image acquisition challenging due to respiratory motion and Image acquisition challenging due to patient body habitus. IMPRESSIONS  1. Left ventricular ejection fraction, by estimation, is 60 to 65%. The left ventricle has normal function. The left ventricle has no regional wall motion abnormalities. Left ventricular diastolic parameters are indeterminate. There is the interventricular septum is flattened in systole and diastole, consistent with right ventricular pressure and volume overload.  2. Right ventricular systolic function is moderately reduced. The right ventricular  size is severely enlarged. There is severely elevated pulmonary artery systolic pressure. The estimated right ventricular systolic pressure is 92.5 mmHg.  3. Right atrial size was severely dilated.  4. The mitral valve is normal in structure. No evidence of mitral valve regurgitation. No evidence of mitral stenosis.  5. Tricuspid valve regurgitation is moderate.  6. The aortic valve is normal in structure. Aortic valve regurgitation is not visualized. No aortic stenosis is present.  7. Pulmonic valve regurgitation is moderate.  8. The inferior vena cava is normal in size with greater than 50% respiratory variability, suggesting right atrial pressure of 3 mmHg. Comparison(s): EF 65%, RVSP 111 mmHg. FINDINGS  Left Ventricle: Left ventricular ejection fraction, by estimation, is 60 to 65%. The left ventricle has normal function. The left ventricle has no regional wall motion abnormalities. The left ventricular internal cavity size was normal in size. There is  no left ventricular hypertrophy. The interventricular septum is flattened in systole and diastole, consistent with right ventricular pressure and volume overload. Left ventricular diastolic parameters are indeterminate. Right Ventricle: The right ventricular size is severely enlarged. No increase in right ventricular wall thickness. Right ventricular systolic function is moderately reduced. There is severely elevated pulmonary artery systolic pressure. The tricuspid regurgitant velocity is 4.73 m/s, and with an assumed right atrial pressure of 3 mmHg, the estimated right ventricular systolic pressure is 92.5 mmHg. Left Atrium: Left atrial size was normal in size. Right Atrium: Right atrial size was severely dilated. Pericardium: There is no evidence of pericardial effusion. Mitral Valve: The mitral valve is normal in structure. No evidence of mitral valve regurgitation. No evidence of mitral valve stenosis. Tricuspid Valve: The tricuspid valve is normal in  structure. Tricuspid valve regurgitation is moderate . No evidence of tricuspid stenosis. Aortic Valve: The aortic valve is normal in structure. Aortic valve regurgitation is not visualized. No aortic stenosis is present. Aortic valve mean gradient measures 2.5 mmHg. Aortic valve peak gradient measures 5.3 mmHg. Aortic valve area, by VTI measures 2.32 cm. Pulmonic Valve: The pulmonic valve was normal in structure. Pulmonic valve regurgitation is moderate. No evidence of pulmonic stenosis. Aorta: The aortic root is normal in size and structure. Venous: The inferior vena cava is normal in size with greater than 50% respiratory variability, suggesting right atrial pressure of 3 mmHg. IAS/Shunts: No atrial level shunt detected by color flow Doppler.  LEFT VENTRICLE PLAX 2D LVIDd:         4.30 cm   Diastology LVIDs:         2.80 cm   LV e' medial:    11.40 cm/s LV PW:         1.00 cm   LV E/e' medial:  5.7 LV IVS:        0.90 cm   LV  e' lateral:   13.20 cm/s LVOT diam:     2.00 cm   LV E/e' lateral: 4.9 LV SV:         41 LV SV Index:   19 LVOT Area:     3.14 cm  RIGHT VENTRICLE RV S prime:     5.91 cm/s TAPSE (M-mode): 1.0 cm LEFT ATRIUM             Index        RIGHT ATRIUM           Index LA diam:        3.70 cm 1.75 cm/m   RA Area:     25.30 cm LA Vol (A2C):   44.0 ml 20.79 ml/m  RA Volume:   97.70 ml  46.17 ml/m LA Vol (A4C):   34.5 ml 16.30 ml/m LA Biplane Vol: 41.2 ml 19.47 ml/m  AORTIC VALVE                    PULMONIC VALVE AV Area (Vmax):    2.19 cm     PV Vmax:          0.74 m/s AV Area (Vmean):   2.09 cm     PV Peak grad:     2.2 mmHg AV Area (VTI):     2.32 cm     PR End Diast Vel: 11.29 msec AV Vmax:           115.00 cm/s AV Vmean:          76.000 cm/s AV VTI:            0.178 m AV Peak Grad:      5.3 mmHg AV Mean Grad:      2.5 mmHg LVOT Vmax:         80.00 cm/s LVOT Vmean:        50.600 cm/s LVOT VTI:          0.131 m LVOT/AV VTI ratio: 0.74  AORTA Ao Root diam: 3.20 cm Ao Asc diam:  3.20 cm Ao  Arch diam: 3.1 cm MITRAL VALVE               TRICUSPID VALVE MV Area (PHT): 4.21 cm    TR Peak grad:   89.5 mmHg MV Decel Time: 180 msec    TR Vmax:        473.00 cm/s MV E velocity: 64.50 cm/s MV A velocity: 63.30 cm/s  SHUNTS MV E/A ratio:  1.02        Systemic VTI:  0.13 m                            Systemic Diam: 2.00 cm Donato Schultz MD Electronically signed by Donato Schultz MD Signature Date/Time: 02/25/2023/10:59:21 AM    Final     Administration History     None          Latest Ref Rng & Units 03/21/2023    3:59 PM 05/28/2021    9:53 AM  PFT Results  FVC-Pre L 1.84  P 1.63   FVC-Predicted Pre % 55  P 61   FVC-Post L 1.86  P 1.71   FVC-Predicted Post % 56  P 64   Pre FEV1/FVC % % 69  P 78   Post FEV1/FCV % % 77  P 81   FEV1-Pre L 1.27  P 1.27   FEV1-Predicted Pre % 50  P 61   FEV1-Post L 1.43  P 1.39   DLCO uncorrected ml/min/mmHg 9.80  P 9.06   DLCO UNC% % 47  P 43   DLCO corrected ml/min/mmHg 9.95  P   DLCO COR %Predicted % 48  P   DLVA Predicted % 90  P 78   TLC L 3.68  P 3.33   TLC % Predicted % 70  P 63   RV % Predicted % 87  P 77     P Preliminary result    No results found for: "NITRICOXIDE"      Assessment & Plan:   DOE (dyspnea on exertion) Likely multifactorial with asthma, deconditioning, obesity, CHF and PH. She has restriction on PFTs, likely due to volume overload/body habitus. She does have some smoking related bronchiolitis and mild emphysema on imaging. No formal obstruction on PFTs. Check labs today. Walk test without desaturations on room air but only able to complete 1 lap (250 ft) due to dyspnea.   Patient Instructions  Continue Albuterol inhaler 2 puffs or 3 mL neb every 6 hours as needed for shortness of breath or wheezing. Notify if symptoms persist despite rescue inhaler/neb use.  Trial Symbicort 2 puffs Twice daily. Brush tongue and rinse mouth afterwards. We will see if this helps with your breathing  We will check some allergy testing on  you   Your lung function testing showed a moderate to severe restriction in your lung function, which is likely due to volume overload and possibly a component of your weight too. You also have worsening right heart strain and elevated pulmonary artery pressure. I may adjust your lasix after we get your labs back  You need to go back to see Dr. Gala Romney to discuss your pulmonary hypertension - call his office to make an appt [phone]  Walk test today   Overnight oxygen study - someone will contact you for scheduling   Labs today   Start Wellbutrin - 1 tab (150 mg) daily for 3 days then increase to 1 tab twice daily. Notify of any mood changes immediately. Do not drink alcohol while you are on this. This should help with quitting smoking. Gradually cut back with aim quit date in 4-6 weeks   Follow up in 4-6 weeks with Dr. Tonia Brooms or Philis Nettle. If symptoms do not improve or worsen, please contact office for sooner follow up or seek emergency care.    Mild persistent asthma See above. Reversibility on PFTs, consistent with underlying asthma. Will trial her on ICS/LABA for asthma and reassess at follow up. Check allergen markers. Action plan in place  Pulmonary hypertension (HCC) Severely elevated pulmonary artery pressures on echo. Appears to have worsening right sided heart strain as well. She is on 40 mg lasix Twice daily, which she is compliant with. She appears euvolemic on exam. Will check BNP/BMET today. May consider adjusting her lasix. She may need additional therapies for management of PH. ONO on room air ordered to evaluate for nocturnal hypoxia. Will have her follow up with HF clinic ASAP.  Chronic diastolic CHF (congestive heart failure) (HCC) Unable to evaluate diastolic parameters on most recent echo. EF nl. RV failure present. See above  Tobacco use Smoking cessation strongly advised. Will start her on Wellbutrin for smoking cessation. Side effect profile reviewed.  Understands proper use of medication and to monitor for any mood changes.   The patient's current tobacco use: 1/4 ppd The patient was advised to quit and impact of  smoking on their health.  I assessed the patient's willingness to attempt to quit. I provided methods and skills for cessation. We reviewed medication management of smoking session drugs if appropriate. Resources to help quit smoking were provided. A smoking cessation quit date was set: 04/22/2022 Follow-up was arranged in our clinic.  The amount of time spent counseling patient was 5 mins    Respiratory bronchiolitis interstitial lung disease (HCC) Needs to quit smoking. See above.   Lung nodule Resolved RUL nodule. Stable scattered nodules. Lung RADS 2. Continue annual screening - due 12/2023   Advised if symptoms do not improve or worsen, to please contact office for sooner follow up or seek emergency care.   I spent 50 minutes of dedicated to the care of this patient on the date of this encounter to include pre-visit review of records, face-to-face time with the patient discussing conditions above, post visit ordering of testing, clinical documentation with the electronic health record, making appropriate referrals as documented, and communicating necessary findings to members of the patients care team.  Noemi Chapel, NP 03/25/2023  Pt aware and understands NP's role.

## 2023-03-24 ENCOUNTER — Ambulatory Visit (HOSPITAL_COMMUNITY)
Admission: RE | Admit: 2023-03-24 | Discharge: 2023-03-24 | Disposition: A | Discharge status: Home / Self Care | Payer: Medicare PPO | Source: Ambulatory Visit | Attending: Cardiology | Admitting: Cardiology

## 2023-03-24 ENCOUNTER — Encounter (HOSPITAL_COMMUNITY): Payer: Self-pay

## 2023-03-24 VITALS — BP 128/70 | HR 95 | Wt 227.0 lb

## 2023-03-24 DIAGNOSIS — F32A Depression, unspecified: Secondary | ICD-10-CM | POA: Diagnosis not present

## 2023-03-24 DIAGNOSIS — Z7951 Long term (current) use of inhaled steroids: Secondary | ICD-10-CM | POA: Insufficient documentation

## 2023-03-24 DIAGNOSIS — Z951 Presence of aortocoronary bypass graft: Secondary | ICD-10-CM | POA: Diagnosis not present

## 2023-03-24 DIAGNOSIS — Z7984 Long term (current) use of oral hypoglycemic drugs: Secondary | ICD-10-CM | POA: Diagnosis not present

## 2023-03-24 DIAGNOSIS — R918 Other nonspecific abnormal finding of lung field: Secondary | ICD-10-CM | POA: Insufficient documentation

## 2023-03-24 DIAGNOSIS — E7800 Pure hypercholesterolemia, unspecified: Secondary | ICD-10-CM | POA: Insufficient documentation

## 2023-03-24 DIAGNOSIS — Z716 Tobacco abuse counseling: Secondary | ICD-10-CM | POA: Insufficient documentation

## 2023-03-24 DIAGNOSIS — J449 Chronic obstructive pulmonary disease, unspecified: Secondary | ICD-10-CM | POA: Insufficient documentation

## 2023-03-24 DIAGNOSIS — F17290 Nicotine dependence, other tobacco product, uncomplicated: Secondary | ICD-10-CM | POA: Insufficient documentation

## 2023-03-24 DIAGNOSIS — I2510 Atherosclerotic heart disease of native coronary artery without angina pectoris: Secondary | ICD-10-CM | POA: Diagnosis not present

## 2023-03-24 DIAGNOSIS — Z7985 Long-term (current) use of injectable non-insulin antidiabetic drugs: Secondary | ICD-10-CM | POA: Diagnosis not present

## 2023-03-24 DIAGNOSIS — I5032 Chronic diastolic (congestive) heart failure: Secondary | ICD-10-CM | POA: Insufficient documentation

## 2023-03-24 DIAGNOSIS — Z713 Dietary counseling and surveillance: Secondary | ICD-10-CM | POA: Insufficient documentation

## 2023-03-24 DIAGNOSIS — Z794 Long term (current) use of insulin: Secondary | ICD-10-CM | POA: Insufficient documentation

## 2023-03-24 DIAGNOSIS — Z7982 Long term (current) use of aspirin: Secondary | ICD-10-CM | POA: Insufficient documentation

## 2023-03-24 DIAGNOSIS — Z862 Personal history of diseases of the blood and blood-forming organs and certain disorders involving the immune mechanism: Secondary | ICD-10-CM | POA: Insufficient documentation

## 2023-03-24 DIAGNOSIS — I272 Pulmonary hypertension, unspecified: Secondary | ICD-10-CM | POA: Insufficient documentation

## 2023-03-24 DIAGNOSIS — I130 Hypertensive heart and chronic kidney disease with heart failure and stage 1 through stage 4 chronic kidney disease, or unspecified chronic kidney disease: Secondary | ICD-10-CM | POA: Diagnosis not present

## 2023-03-24 DIAGNOSIS — Z8673 Personal history of transient ischemic attack (TIA), and cerebral infarction without residual deficits: Secondary | ICD-10-CM | POA: Diagnosis not present

## 2023-03-24 DIAGNOSIS — G4733 Obstructive sleep apnea (adult) (pediatric): Secondary | ICD-10-CM | POA: Insufficient documentation

## 2023-03-24 DIAGNOSIS — Z6837 Body mass index (BMI) 37.0-37.9, adult: Secondary | ICD-10-CM | POA: Insufficient documentation

## 2023-03-24 DIAGNOSIS — N1832 Chronic kidney disease, stage 3b: Secondary | ICD-10-CM | POA: Diagnosis not present

## 2023-03-24 DIAGNOSIS — E1122 Type 2 diabetes mellitus with diabetic chronic kidney disease: Secondary | ICD-10-CM | POA: Diagnosis not present

## 2023-03-24 DIAGNOSIS — Z79899 Other long term (current) drug therapy: Secondary | ICD-10-CM | POA: Diagnosis not present

## 2023-03-24 LAB — BASIC METABOLIC PANEL
Anion gap: 10 (ref 5–15)
BUN: 22 mg/dL (ref 8–23)
CO2: 27 mmol/L (ref 22–32)
Calcium: 9.7 mg/dL (ref 8.9–10.3)
Chloride: 105 mmol/L (ref 98–111)
Creatinine, Ser: 1.51 mg/dL — ABNORMAL HIGH (ref 0.44–1.00)
GFR, Estimated: 38 mL/min — ABNORMAL LOW (ref >60)
Glucose, Bld: 52 mg/dL — ABNORMAL LOW (ref 70–99)
Potassium: 4.4 mmol/L (ref 3.5–5.1)
Sodium: 142 mmol/L (ref 135–145)

## 2023-03-24 LAB — BRAIN NATRIURETIC PEPTIDE: B Natriuretic Peptide: 810.9 pg/mL — ABNORMAL HIGH (ref 0.0–100.0)

## 2023-03-24 NOTE — Progress Notes (Signed)
ADVANCED HF CLINIC NOTE  PCP: Hoy Register, MD Primary Cardiologist: Donato Schultz, MD HF Cardiologist: Dr. Gala Romney  HPI: Deborah Coffey is a 67 y.o. with CAD s/p CABG x1 (LIMA-LAD) 2005, tobacco use, chronic diastolic CHF, anemia, depression, HLD, HTN, DM, CKD stage 3b, prior TIAs, pulmonary HTN (felt due to underlying lung disease/smoking), morbid obesity referred by Dr. Anne Fu for further evaluation of her PAH.    Echocardiogram 12/22 EF 65-70% RV moderately dilated with moderate HK. Flattened septum Moderate TR RVSP est    Chest CT scan 2/22. No PE   R/L cath on 04/16/21: - mLAD 75% LIMA to LAD is widely patent. pRCA 50% - EF 65% - RA 5 PA 79/18 (39) PCW 12  CO 6.4 L/min; CI PVR 4.2  Sleep study 2023 only c/w mild OSA   Echo 11/24 EF 60-65%, IVC flattened in systole and diastole, c/w RV pressure and volume overload.  RV severely enlarged w/ severely reduced systolic function. The estimated RVSP is 92.5 mmHg (comparred to 111 mmHg previous study). RA severely dilated. Moderate TR    PFTs 12/24: FVC 55, FEV1 50, ratio 77, TLC 70, DLCOcor 48. Moderately severe restriction with reversibility (12% change) and severe diffusing defect. She is being followed by [facility] (Dr. Tonia Brooms). He has also being following her for pulmonary nodules.   She presents to [facility] today for f/u. Has been over a year since her last visit, 9/23. Denies resting dyspnea but reports dyspnea w/ exertion. NYHA Class II-early III. Reports full med compliance and good UOP w/ lasix. Her only cardiac med change recently was discontinuation of lisinopril. This was stopped last month. She was sent to the ED by PCP for abnormal labs (elevated SCr and low BP). SCr was elevated up to 1.8. BP in the low 100s systolic. Also seen by pulmonology yesterday and started on a new inhaler and prescribed Wellbutrin to help w/ smoking cessation. She denies chest pain. No palpitations. No LEE, orthopnea or PND. Her  BP is ok today off of lisinopril at 128/70.   Cardiac Studies:  - PFTs 2/23 FEV1 1.27 (61%) FVC 1.63 (61%) DLCO 43%  - R/LHC (12/22): EF 65%, mLAD 75% LIMA to LAD is widely patent. pRCA 50; RA 5 PA 79/18 (39) PCW 12  CO 6.4 L/min; CI PVR 4.2  - Echo (12/22): EF 65-70% RV moderately dilated with moderate HK. Flattened septum Moderate TR RVSP est   - Chest CT scan 2/22. No PE  -Echo 11/24 1. Left ventricular ejection fraction, by estimation, is 60 to 65%. The  left ventricle has normal function. The left ventricle has no regional  wall motion abnormalities. Left ventricular diastolic parameters are  indeterminate. There is the  interventricular septum is flattened in systole and diastole, consistent  with right ventricular pressure and volume overload.   2. Right ventricular systolic function is moderately reduced. The right  ventricular size is severely enlarged. There is severely elevated  pulmonary artery systolic pressure. The estimated right ventricular  systolic pressure is 92.5 mmHg.   3. Right atrial size was severely dilated.   4. The mitral valve is normal in structure. No evidence of mitral valve  regurgitation. No evidence of mitral stenosis.   5. Tricuspid valve regurgitation is moderate.   6. The aortic valve is normal in structure. Aortic valve regurgitation is  not visualized. No aortic stenosis is present.   7. Pulmonic valve regurgitation is moderate.   8. The inferior vena cava is  normal in size with greater than 50%  respiratory variability, suggesting right atrial pressure of 3 mmHg.   Past Medical History:  Diagnosis Date   Allergy    Anemia    CAD in native artery 02/18/2004   Mid LAD lesoin just after Large D1 --> CABG x 1 LIMA-LAD   CAP (community acquired pneumonia) 08/07/2014   Chronic kidney disease, stage 3b (HCC)    Depression    High cholesterol    History of blood transfusion 04/20/2003   "related to OHS"    Hypertension    Lung  nodules    Mild carotid artery disease (HCC)    Pulmonary hypertension (HCC)    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 competitive flow   TIA (transient ischemic attack)    Type II diabetes mellitus (HCC)     Current Outpatient Medications  Medication Sig Dispense Refill   acetaminophen (TYLENOL) 500 MG tablet Take 1,000 mg by mouth every 6 (six) hours as needed for headache (pain).      albuterol (VENTOLIN HFA) 108 (90 Base) MCG/ACT inhaler Inhale 2 puffs into the lungs every 6 (six) hours as needed for wheezing or shortness of breath. 8 g 6   Alcohol Swabs (B-D SINGLE USE SWABS REGULAR) PADS USE AS DIRECTED 100 each 0   aspirin EC 81 MG tablet Take 81 mg by mouth daily.     atorvastatin (LIPITOR) 10 MG tablet Take 1 tablet (10 mg total) by mouth once daily. 90 tablet 1   Blood Glucose Monitoring Suppl (ONETOUCH VERIO) w/Device KIT Use as instructed to test blood glucose 3 times daily. 1 kit 0   budesonide-formoterol (SYMBICORT) 80-4.5 MCG/ACT inhaler Inhale 2 puffs into the lungs in the morning and at bedtime. 20.4 each 5   buPROPion (WELLBUTRIN SR) 150 MG 12 hr tablet Take 1 tablet (150 mg total) by mouth 2 (two) times daily. 60 tablet 5   clobetasol cream (TEMOVATE) 0.05 % Apply topically 2 (two) times daily. (Patient taking differently: Apply 1 Application topically daily as needed (Rash).) 60 g 0   dapagliflozin propanediol (FARXIGA) 10 MG TABS tablet Take 1 tablet (10 mg total) by mouth daily before breakfast. 90 tablet 1   diclofenac Sodium (VOLTAREN) 1 % GEL Apply 2 g topically daily as needed (muscle pain). 150 g 1   furosemide (LASIX) 40 MG tablet Take 1 tablet (40 mg total) by mouth 2 (two) times daily. 180 tablet 1   gabapentin (NEURONTIN) 300 MG capsule Take orally 2 caps (600 mg) in the morning and 3 capsules (900 mg) in the evening 450 capsule 1   glucose blood (ONETOUCH VERIO) test strip Use as instructed to test blood  glucose 3 times daily. 100 each 3   insulin glargine, 2 Unit Dial, (TOUJEO MAX SOLOSTAR) 300 UNIT/ML Solostar Pen Inject 64 Units into the skin daily. 6 mL 6   Insulin Pen Needle (DROPLET PEN NEEDLES) 31G X 8 MM MISC USE WITH TOUJEO SOLOSTAR PEN EVERY DAY 100 each 0   Lancets (ONETOUCH DELICA PLUS LANCET33G) MISC Use as instructed to test blood glucose 3 times daily. 100 each 3   lisinopril (ZESTRIL) 10 MG tablet Take 1 tablet (10 mg total) by mouth daily. 90 tablet 1   metFORMIN (GLUCOPHAGE-XR) 500 MG 24 hr tablet Take 2 tablets (1,000 mg total) by mouth 2 (two) times daily. 360 tablet 1   metoprolol tartrate (LOPRESSOR) 50 MG tablet Take  1 tablet (50 mg total) by mouth 2 (two) times daily. 180 tablet 1   Multiple Vitamin (MULTIVITAMIN WITH MINERALS) TABS tablet Take 1 tablet by mouth daily.     nitroGLYCERIN (NITROSTAT) 0.4 MG SL tablet Place 1 tablet (0.4 mg total) under the tongue every 5 (five) minutes as needed for chest pain. 25 tablet 0   pantoprazole (PROTONIX) 40 MG tablet Take 1 tablet (40 mg total) by mouth 2 (two) times daily. (Patient taking differently: Take 40 mg by mouth 2 (two) times daily. As needed) 180 tablet 1   Semaglutide,0.25 or 0.5MG /DOS, (OZEMPIC, 0.25 OR 0.5 MG/DOSE,) 2 MG/1.5ML SOPN Inject 0.25 mg into the skin once a week. (Patient taking differently: Inject 0.25 mg into the skin once a week. Thursadys) 2 mL 6   Continuous Glucose Sensor (DEXCOM G6 SENSOR) MISC Use to check blood sugar three times daily. Dx. E11.59 (Patient not taking: Reported on 03/24/2023) 3 each 3   No current facility-administered medications for this encounter.   Allergies  Allergen Reactions   Ciprofloxacin Rash   Social History   Socioeconomic History   Marital status: Divorced    Spouse name: Not on file   Number of children: Not on file   Years of education: 12   Highest education level: Some college, no degree  Occupational History   Occupation: Disabled  Tobacco Use   Smoking  status: Former    Current packs/day: 0.25    Average packs/day: 0.3 packs/day for 42.0 years (10.5 ttl pk-yrs)    Types: Cigarettes   Smokeless tobacco: Never   Tobacco comments:    Smokes 1-3 cigars/ black cigarettes a week. Agreed to referral to [place] quit line. Referral faxed. Patient given cessation information  Vaping Use   Vaping status: Some Days   Substances: Flavoring  Substance and Sexual Activity   Alcohol use: Not Currently   Drug use: No    Types: Marijuana    Comment: smoked pot years ago but not currently   Sexual activity: Not Currently  Other Topics Concern   Not on file  Social History Narrative   Lives with daughter and 3 grandchildren   Social Determinants of Health   Financial Resource Strain: Low Risk  (08/19/2022)   Overall Financial Resource Strain (CARDIA)    Difficulty of Paying Living Expenses: Not hard at all  Food Insecurity: No Food Insecurity (08/19/2022)   Hunger Vital Sign    Worried About Running Out of Food in the Last Year: Never true    Ran Out of Food in the Last Year: Never true  Transportation Needs: No Transportation Needs (08/19/2022)   PRAPARE - Administrator, Civil Service (Medical): No    Lack of Transportation (Non-Medical): No  Physical Activity: Inactive (08/19/2022)   Exercise Vital Sign    Days of Exercise per Week: 0 days    Minutes of Exercise per Session: 0 min  Stress: No Stress Concern Present (08/19/2022)   [employer] of Occupational Health - Occupational Stress Questionnaire    Feeling of Stress : Not at all  Social Connections: Not on file  Intimate Partner Violence: Not At Risk (02/03/2023)   Humiliation, Afraid, Rape, and Kick questionnaire    Fear of Current or Ex-Partner: No    Emotionally Abused: No    Physically Abused: No    Sexually Abused: No   Family History  Problem Relation Age of Onset   Heart disease Mother    Diabetes Mother  Cancer Mother        cervical   Diabetes Father     Diabetes Sister    Heart disease Sister    Lupus Sister    Heart failure Sister    Hepatitis C Sister    Hypertension Daughter    Diabetes Brother    Heart attack Brother    Heart failure Brother    Diabetes Brother    Colon cancer Neg Hx    Breast cancer Neg Hx    BP 128/70   Pulse 95   Wt 103 kg (227 lb)   LMP  (LMP Unknown)   SpO2 94%   BMI 37.77 kg/m   Wt Readings from Last 3 Encounters:  03/24/23 103 kg (227 lb)  03/23/23 106.1 kg (234 lb)  02/02/23 106.3 kg (234 lb 6.4 oz)   PHYSICAL EXAM: General:  Well appearing, obese. No respiratory difficulty HEENT: normal Neck: supple. no JVD. Carotids 2+ bilat; no bruits. No lymphadenopathy or thyromegaly appreciated. Cor: PMI nondisplaced. Regular rate & rhythm. No rubs, gallops or murmurs. Lungs: clear Abdomen: soft, nontender, obese, mildly distended. No hepatosplenomegaly. No bruits or masses. Good bowel sounds. Extremities: no cyanosis, clubbing, rash, edema Neuro: alert & oriented x 3, cranial nerves grossly intact. moves all 4 extremities w/o difficulty. Affect pleasant.   ASSESSMENT & PLAN: 1. Pulmonary HTN with RV strain - Echo (12/22): EF 65-70% RV moderately dilated with moderate HK, flattened septum, moderate TR, RVSP est 110 mmHG   - Chest CT scan (2/22): No PE - RHC (12/22): RA 5 PA 79/18 (39) PCW 12  CO 6.4 L/min; CI PVR 4.2 - PFTs with significant restrictive/obstructive lung disease - ANA, ANCA, [place]-70, RF all negative. - Previous Hall walk (3/23) 95% -> 88%. Defer today with LLE swelling/pain. - Sleep study with mild OSA.  - Suspect mostly WHO Group III. Given severity of lung disease, would not try sildenafil given risk of shunting - NYHA Class II-early III, also confounded by obesity/deconditioning. Obesity/body habitus also makes volume assessment difficult. Concern for possible volume overload>>will check BNP level to help guide diuresis. Suspect she may need up titration of lasix regimen to BID,  currently taking 80 mg daily  - Continue Farxiga 10 mg daily. - Continue metoprolol tartrate 50 mg bid. - off lisinopril given recent AKI and hypotension  - Discussed need for weight loss and smoking cessation. She is now on Ozempic and just prescribed Wellbutrin to help w/ smoking cessation.   2. CAD - CABG 2005 LIMA-> LAD - Cath (04/16/21): mLAD 75% LIMA to LAD is widely patent. pRCA 50 - stable w/o CP  - Continue ASA/statin. - Management per Dr. Anne Fu.  3. Morbid obesity - Body mass index is 37.77 kg/m. - Needs weight loss. - She is now on Ozempic.  4. OSA - Mild on sleep study. - Refer to Dr. Mayford Knife to discuss CPAP.  F/u in 3 months w/ Dr. Aquilla Solian, PA-C  11:26 AM

## 2023-03-24 NOTE — Patient Instructions (Signed)
Labs done today. We will contact you only if your labs are abnormal.  No other medication changes were made. Please continue all current medications as prescribed.  Your physician recommends that you schedule a follow-up appointment in: 3-4 months with Dr. Gala Romney. Please contact our office in January 2025 to schedule a March 2025 appointment.   If you have any questions or concerns before your next appointment please send [place] a message through [place] or call our office at [phone].    TO LEAVE A MESSAGE FOR THE NURSE SELECT OPTION 2, PLEASE LEAVE A MESSAGE INCLUDING: YOUR NAME DATE OF BIRTH CALL BACK NUMBER REASON FOR CALL**this is important as we prioritize the call backs  YOU WILL RECEIVE A CALL BACK THE SAME DAY AS LONG AS YOU CALL BEFORE 4:00 PM   Do the following things EVERYDAY: Weigh yourself in the morning before breakfast. Write it down and keep it in a log. Take your medicines as prescribed Eat low salt foods--Limit salt (sodium) to 2000 mg per day.  Stay as active as you can everyday Limit all fluids for the day to less than 2 liters   At the [facility], you and your health needs are our priority. As part of our continuing mission to provide you with exceptional heart care, we have created designated Provider Care Teams. These Care Teams include your primary Cardiologist (physician) and Advanced Practice Providers (APPs- Physician Assistants and Nurse Practitioners) who all work together to provide you with the care you need, when you need it.   You may see any of the following providers on your designated Care Team at your next follow up: Dr Arvilla Meres Dr Marca Ancona Dr. Marcos Eke, NP Robbie Lis, [place] [facility] [place], [place] Brynda Peon, NP Karle Plumber, PharmD   Please be sure to bring in all your medications bottles to every appointment.    Thank you for choosing [facility]

## 2023-03-25 ENCOUNTER — Encounter: Payer: Self-pay | Admitting: Nurse Practitioner

## 2023-03-25 ENCOUNTER — Telehealth (HOSPITAL_COMMUNITY): Payer: Self-pay

## 2023-03-25 DIAGNOSIS — I5032 Chronic diastolic (congestive) heart failure: Secondary | ICD-10-CM

## 2023-03-25 DIAGNOSIS — J4530 Mild persistent asthma, uncomplicated: Secondary | ICD-10-CM | POA: Insufficient documentation

## 2023-03-25 DIAGNOSIS — R911 Solitary pulmonary nodule: Secondary | ICD-10-CM | POA: Insufficient documentation

## 2023-03-25 DIAGNOSIS — J84115 Respiratory bronchiolitis interstitial lung disease: Secondary | ICD-10-CM | POA: Insufficient documentation

## 2023-03-25 MED ORDER — FUROSEMIDE 80 MG PO TABS: ORAL_TABLET | ORAL | 11 refills | Status: DC

## 2023-03-25 NOTE — Telephone Encounter (Signed)
-----   Message from [place] sent at 03/24/2023  2:28 PM EST ----- Fluid marker (BNP) elevated. Renal fx improved. Increase lasix to 80 mg qam and 40 mg qpm. Repeat BMP and BNP in 1 wk.

## 2023-03-25 NOTE — Assessment & Plan Note (Addendum)
Severely elevated pulmonary artery pressures on echo. Appears to have worsening right sided heart strain as well. She is on 40 mg lasix Twice daily, which she is compliant with. She appears euvolemic on exam. Will check BNP/BMET today. May consider adjusting her lasix. She may need additional therapies for management of PH. ONO on room air ordered to evaluate for nocturnal hypoxia. Will have her follow up with HF clinic ASAP.

## 2023-03-25 NOTE — Assessment & Plan Note (Signed)
Unable to evaluate diastolic parameters on most recent echo. EF nl. RV failure present. See above

## 2023-03-25 NOTE — Telephone Encounter (Signed)
Patient's lasix medication has been changed and updated in pt's chart. In addition, pt's labs has been ordered and appointment scheduled. Pt aware, agreeable, and verbalized understanding.

## 2023-03-25 NOTE — Assessment & Plan Note (Signed)
Smoking cessation strongly advised. Will start her on Wellbutrin for smoking cessation. Side effect profile reviewed. Understands proper use of medication and to monitor for any mood changes.   The patient's current tobacco use: 1/4 ppd The patient was advised to quit and impact of smoking on their health.  I assessed the patient's willingness to attempt to quit. I provided methods and skills for cessation. We reviewed medication management of smoking session drugs if appropriate. Resources to help quit smoking were provided. A smoking cessation quit date was set: 04/22/2022 Follow-up was arranged in our clinic.  The amount of time spent counseling patient was 5 mins

## 2023-03-25 NOTE — Assessment & Plan Note (Signed)
Needs to quit smoking. See above.

## 2023-03-25 NOTE — Assessment & Plan Note (Signed)
See above. Reversibility on PFTs, consistent with underlying asthma. Will trial her on ICS/LABA for asthma and reassess at follow up. Check allergen markers. Action plan in place

## 2023-03-25 NOTE — Assessment & Plan Note (Signed)
Resolved RUL nodule. Stable scattered nodules. Lung RADS 2. Continue annual screening - due 12/2023

## 2023-03-25 NOTE — Assessment & Plan Note (Addendum)
Likely multifactorial with asthma, deconditioning, obesity, CHF and PH. She has restriction on PFTs, likely due to volume overload/body habitus. She does have some smoking related bronchiolitis and mild emphysema on imaging. No formal obstruction on PFTs. Check labs today. Walk test without desaturations on room air but only able to complete 1 lap (250 ft) due to dyspnea.   Patient Instructions  Continue Albuterol inhaler 2 puffs or 3 mL neb every 6 hours as needed for shortness of breath or wheezing. Notify if symptoms persist despite rescue inhaler/neb use.  Trial Symbicort 2 puffs Twice daily. Brush tongue and rinse mouth afterwards. We will see if this helps with your breathing  We will check some allergy testing on you   Your lung function testing showed a moderate to severe restriction in your lung function, which is likely due to volume overload and possibly a component of your weight too. You also have worsening right heart strain and elevated pulmonary artery pressure. I may adjust your lasix after we get your labs back  You need to go back to see Dr. Gala Romney to discuss your pulmonary hypertension - call his office to make an appt [phone]  Walk test today   Overnight oxygen study - someone will contact you for scheduling   Labs today   Start Wellbutrin - 1 tab (150 mg) daily for 3 days then increase to 1 tab twice daily. Notify of any mood changes immediately. Do not drink alcohol while you are on this. This should help with quitting smoking. Gradually cut back with aim quit date in 4-6 weeks   Follow up in 4-6 weeks with Dr. Tonia Brooms or Philis Nettle. If symptoms do not improve or worsen, please contact office for sooner follow up or seek emergency care.

## 2023-04-01 ENCOUNTER — Ambulatory Visit (HOSPITAL_COMMUNITY)
Admission: RE | Admit: 2023-04-01 | Discharge: 2023-04-01 | Disposition: A | Discharge status: Home / Self Care | Payer: Medicare PPO | Source: Ambulatory Visit | Attending: Cardiology | Admitting: Cardiology

## 2023-04-01 DIAGNOSIS — I5032 Chronic diastolic (congestive) heart failure: Secondary | ICD-10-CM | POA: Insufficient documentation

## 2023-04-01 LAB — BASIC METABOLIC PANEL
Anion gap: 8 (ref 5–15)
BUN: 33 mg/dL — ABNORMAL HIGH (ref 8–23)
CO2: 26 mmol/L (ref 22–32)
Calcium: 9.4 mg/dL (ref 8.9–10.3)
Chloride: 103 mmol/L (ref 98–111)
Creatinine, Ser: 1.72 mg/dL — ABNORMAL HIGH (ref 0.44–1.00)
GFR, Estimated: 33 mL/min — ABNORMAL LOW (ref >60)
Glucose, Bld: 257 mg/dL — ABNORMAL HIGH (ref 70–99)
Potassium: 4.7 mmol/L (ref 3.5–5.1)
Sodium: 137 mmol/L (ref 135–145)

## 2023-04-01 LAB — BRAIN NATRIURETIC PEPTIDE: B Natriuretic Peptide: 1206.8 pg/mL — ABNORMAL HIGH (ref 0.0–100.0)

## 2023-04-11 ENCOUNTER — Telehealth (HOSPITAL_COMMUNITY): Payer: Self-pay

## 2023-04-11 DIAGNOSIS — I5032 Chronic diastolic (congestive) heart failure: Secondary | ICD-10-CM

## 2023-04-11 MED ORDER — FUROSEMIDE 80 MG PO TABS: ORAL_TABLET | ORAL | 6 refills | Status: DC

## 2023-04-11 NOTE — Telephone Encounter (Signed)
-----   Message from [place] sent at 04/04/2023  7:14 AM EST ----- BNP (fluid marker) has increased despite lasix increase. Please call to confirm that she is taking Lasix 80 mg bid and check to see if any increase in urine output and if any changes in wt

## 2023-04-11 NOTE — Telephone Encounter (Signed)
Patient's lasix medication has been increased to 80 mg twice a day and her med list updated. In addition, pt's App clinic follow up has been scheduled. Pt aware, agreeable, and verbalized understanding.

## 2023-04-29 NOTE — Progress Notes (Signed)
 ADVANCED HF CLINIC NOTE  PCP: Joaquin Mulberry, MD Primary Cardiologist: Dorothye Gathers, MD HF Cardiologist: Dr. Julane Ny  CC: HF follow up  HPI: Deborah Coffey is a 68 y.o. with CAD s/p CABG x1 (LIMA-LAD) 2005, tobacco use, chronic diastolic CHF, anemia, depression, HLD, HTN, DM, CKD stage 3b, prior TIAs, pulmonary HTN (felt due to underlying lung disease/smoking), and morbid obesity.    Echo12/22 EF 65-70% RV moderately dilated with moderate HK. Flattened septum Moderate TR RVSP est 110 mmHG    Chest CT scan 2/22. No PE   R/L cath on 04/16/21: - mLAD 75% LIMA to LAD is widely patent. pRCA 50% - EF 65% - RA 5 PA 79/18 (39) PCW 12  CO 6.4 L/min; CI PVR 4.2  Sleep study 2023 only c/w mild OSA   Echo 11/24 EF 60-65%, IVC flattened in systole and diastole, c/w RV pressure and volume overload.  RV severely enlarged w/ severely reduced systolic function. The estimated RVSP is 92.5 mmHg (comparred to 111 mmHg previous study). RA severely dilated. Moderate TR    PFTs 12/24: FVC 55, FEV1 50, ratio 77, TLC 70, DLCOcor 48. Moderately severe restriction with reversibility (12% change) and severe diffusing defect. She is being followed by [facility] (Dr. Thelda Finney). He has also being following her for pulmonary nodules.   Today she returns for HF follow up. Overall feeling fine. No SOB walking on flat ground or with ADLs, otherwise not very physically active.. Denies palpitations, CP, dizziness, edema, or PND/Orthopnea. Appetite ok. No fever or chills. Not weighing at home. Taking all medications. Smoking 5-6 cigs/day. On Ozempic , down 15 lbs. BP at home 120-140/80s, has been off ACEi x months with low BP.  Cardiac Studies:  - Echo 11/24 EF 60-65%, IVC flattened in systole and diastole, c/w RV pressure and volume overload,  RV severely enlarged w/ severely reduced systolic function.   - PFTs 2/23 FEV1 1.27 (61%) FVC 1.63 (61%) DLCO 43%  - R/LHC (12/22): EF 65%, mLAD 75% LIMA to LAD  is widely patent. pRCA 50; RA 5 PA 79/18 (39) PCW 12  CO 6.4 L/min; CI PVR 4.2  - Echo (12/22): EF 65-70% RV moderately dilated with moderate HK. Flattened septum Moderate TR RVSP est    - Chest CT scan 2/22. No PE  Past Medical History:  Diagnosis Date   Allergy    Anemia    CAD in native artery 02/18/2004   Mid LAD lesoin just after Large D1 --> CABG x 1 LIMA-LAD   CAP (community acquired pneumonia) 08/07/2014   Chronic kidney disease, stage 3b (HCC)    Depression    High cholesterol    History of blood transfusion 04/20/2003   "related to OHS"    Hypertension    Lung nodules    Mild carotid artery disease (HCC)    Pulmonary hypertension (HCC)    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 competitive flow   TIA (transient ischemic attack)    Type II diabetes mellitus (HCC)    Current Outpatient Medications  Medication Sig Dispense Refill   acetaminophen  (TYLENOL ) 500 MG tablet Take 1,000 mg by mouth every 6 (six) hours as needed for headache (pain).      Alcohol Swabs (B-D SINGLE USE SWABS REGULAR) PADS USE AS DIRECTED 100 each 0   aspirin  EC 81 MG tablet Take 81 mg by mouth daily.     Blood Glucose Monitoring Suppl [facility]  VERIO) w/Device KIT Use as instructed to test blood glucose 3 times daily. 1 kit 0   budesonide-formoterol (SYMBICORT ) 80-4.5 MCG/ACT inhaler Inhale 2 puffs into the lungs in the morning and at bedtime. 20.4 each 5   buPROPion  (WELLBUTRIN  SR) 150 MG 12 hr tablet Take 1 tablet (150 mg total) by mouth 2 (two) times daily. 60 tablet 5   clobetasol  cream (TEMOVATE ) 0.05 % Apply topically 2 (two) times daily. (Patient taking differently: Apply 1 Application topically daily as needed (Rash).) 60 g 0   dapagliflozin  propanediol (FARXIGA ) 10 MG TABS tablet Take 1 tablet (10 mg total) by mouth daily before breakfast. 90 tablet 1   diclofenac  Sodium (VOLTAREN ) 1 % GEL Apply 2 g topically daily as needed (muscle  pain). 150 g 1   furosemide  (LASIX ) 80 MG tablet Take 1 tablet by mouth twice a day 60 tablet 6   gabapentin  (NEURONTIN ) 300 MG capsule Take orally 2 caps (600 mg) in the morning and 3 capsules (900 mg) in the evening 450 capsule 1   glucose blood (ONETOUCH VERIO) test strip Use as instructed to test blood glucose 3 times daily. 100 each 3   insulin  glargine, 2 Unit Dial , (TOUJEO  MAX SOLOSTAR) 300 UNIT/ML Solostar Pen Inject 64 Units into the skin daily. 6 mL 6   Insulin  Pen Needle (DROPLET PEN NEEDLES) 31G X 8 MM MISC USE WITH TOUJEO  SOLOSTAR PEN EVERY DAY 100 each 0   Lancets (ONETOUCH DELICA PLUS LANCET33G) MISC Use as instructed to test blood glucose 3 times daily. 100 each 3   metFORMIN  (GLUCOPHAGE -XR) 500 MG 24 hr tablet Take 2 tablets (1,000 mg total) by mouth 2 (two) times daily. 360 tablet 1   metoprolol  tartrate (LOPRESSOR ) 50 MG tablet Take 1 tablet (50 mg total) by mouth 2 (two) times daily. 180 tablet 1   Multiple Vitamin (MULTIVITAMIN WITH MINERALS) TABS tablet Take 1 tablet by mouth daily.     nitroGLYCERIN  (NITROSTAT ) 0.4 MG SL tablet Place 1 tablet (0.4 mg total) under the tongue every 5 (five) minutes as needed for chest pain. 25 tablet 0   Semaglutide ,0.25 or 0.5MG /DOS, (OZEMPIC , 0.25 OR 0.5 MG/DOSE,) 2 MG/1.5ML SOPN Inject 0.25 mg into the skin once a week. (Patient taking differently: Inject 0.25 mg into the skin once a week. Thursadys) 2 mL 6   albuterol  (VENTOLIN  HFA) 108 (90 Base) MCG/ACT inhaler Inhale 2 puffs into the lungs every 6 (six) hours as needed for wheezing or shortness of breath. (Patient not taking: Reported on 05/04/2023) 8 g 6   atorvastatin  (LIPITOR) 10 MG tablet Take 1 tablet (10 mg total) by mouth once daily. (Patient not taking: Reported on 05/04/2023) 90 tablet 1   Continuous Glucose Sensor (DEXCOM G6 SENSOR) MISC Use to check blood sugar three times daily. Dx. E11.59 (Patient not taking: Reported on 05/04/2023) 3 each 3   pantoprazole  (PROTONIX ) 40 MG tablet  Take 1 tablet (40 mg total) by mouth 2 (two) times daily. (Patient not taking: Reported on 05/04/2023) 180 tablet 1   No current facility-administered medications for this encounter.   Allergies  Allergen Reactions   Ciprofloxacin Rash   Social History   Socioeconomic History   Marital status: Divorced    Spouse name: Not on file   Number of children: Not on file   Years of education: 12   Highest education level: Some college, no degree  Occupational History   Occupation: Disabled  Tobacco Use   Smoking status: Former  Current packs/day: 0.25    Average packs/day: 0.3 packs/day for 42.0 years (10.5 ttl pk-yrs)    Types: Cigarettes   Smokeless tobacco: Never   Tobacco comments:    Smokes 1-3 cigars/ black cigarettes a week. Agreed to referral to [facility] quit line. Referral faxed. Patient given cessation information  Vaping Use   Vaping status: Some Days   Substances: Flavoring  Substance and Sexual Activity   Alcohol use: Not Currently   Drug use: No    Types: Marijuana    Comment: smoked pot years ago but not currently   Sexual activity: Not Currently  Other Topics Concern   Not on file  Social History Narrative   Lives with daughter and 3 grandchildren   Social Drivers of Health   Financial Resource Strain: Low Risk  (08/19/2022)   Overall Financial Resource Strain (CARDIA)    Difficulty of Paying Living Expenses: Not hard at all  Food Insecurity: No Food Insecurity (08/19/2022)   Hunger Vital Sign    Worried About Running Out of Food in the Last Year: Never true    Ran Out of Food in the Last Year: Never true  Transportation Needs: No Transportation Needs (08/19/2022)   PRAPARE - Administrator, Civil Service (Medical): No    Lack of Transportation (Non-Medical): No  Physical Activity: Inactive (08/19/2022)   Exercise Vital Sign    Days of Exercise per Week: 0 days    Minutes of Exercise per Session: 0 min  Stress: No Stress Concern Present (08/19/2022)    [employer] of Occupational Health - Occupational Stress Questionnaire    Feeling of Stress : Not at all  Social Connections: Not on file  Intimate Partner Violence: Not At Risk (02/03/2023)   Humiliation, Afraid, Rape, and Kick questionnaire    Fear of Current or Ex-Partner: No    Emotionally Abused: No    Physically Abused: No    Sexually Abused: No   Family History  Problem Relation Age of Onset   Heart disease Mother    Diabetes Mother    Cancer Mother        cervical   Diabetes Father    Diabetes Sister    Heart disease Sister    Lupus Sister    Heart failure Sister    Hepatitis C Sister    Hypertension Daughter    Diabetes Brother    Heart attack Brother    Heart failure Brother    Diabetes Brother    Colon cancer Neg Hx    Breast cancer Neg Hx    BP (!) 142/88 (BP Location: Left Arm, Patient Position: Sitting)   Pulse 73   Ht 5\' 4"  (1.626 m)   Wt 99.8 kg (220 lb)   LMP  (LMP Unknown)   SpO2 97%   BMI 37.76 kg/m   Wt Readings from Last 3 Encounters:  05/04/23 99.8 kg (220 lb)  03/24/23 103 kg (227 lb)  03/23/23 106.1 kg (234 lb)   PHYSICAL EXAM: General:  NAD. No resp difficulty, walked into clinic HEENT: Normal Neck: Supple. No JVD. Carotids 2+ bilat; no bruits. No lymphadenopathy or thryomegaly appreciated. Cor: PMI nondisplaced. Regular rate & rhythm. No rubs, gallops or murmurs. Lungs: Clear Abdomen: Soft, obese, nontender, nondistended. No hepatosplenomegaly. No bruits or masses. Good bowel sounds. Extremities: No cyanosis, clubbing, rash, edema Neuro: Alert & oriented x 3, cranial nerves grossly intact. Moves all 4 extremities w/o difficulty. Affect pleasant.  ASSESSMENT & PLAN: 1. Pulmonary  HTN with RV strain - Echo (12/22): EF 65-70% RV moderately dilated with moderate HK, flattened septum, moderate TR, RVSP est 110 mmHG   - Chest CT scan (2/22): No PE - RHC (12/22): RA 5 PA 79/18 (39) PCW 12  CO 6.4 L/min; CI PVR 4.2 - PFTs with  significant restrictive/obstructive lung disease - ANA, ANCA, [place]-70, RF all negative. - Previous Hall walk (3/23) 95% -> 88%.  - Sleep study with mild OSA.  - Suspect mostly WHO Group III. Given severity of lung disease, would not try sildenafil  given risk of shunting. - NYHA II, also confounded by obesity/deconditioning. Volume looks OK today  - Continue Lasix  80 mg bid. - Continue Farxiga  10 mg daily. - Continue metoprolol  tartrate 50 mg bid. - off lisinopril  given recent AKI and hypotension  - Labs today.  2. CAD - CABG 2005 LIMA-> LAD - Cath (04/16/21): mLAD 75% LIMA to LAD is widely patent. pRCA 50 - No chest pain - Continue ASA/statin. - Management per Dr. Renna Cary.  3. Morbid obesity - Body mass index is 37.76 kg/m.. - She is now on Ozempic , down 15 lbs  4. OSA - Mild on sleep study. - Has follow up with Pulm  5. HTN - BP up today, but generally well-controlled at home - I asked her to check BP and log - May need to add back low-dose lisinopril   Follow up in 3 months with Dr. Bensimohn  Plez [place] M Rafiel Mecca, FNP  3:23 PM

## 2023-05-04 ENCOUNTER — Ambulatory Visit (HOSPITAL_COMMUNITY)
Admission: RE | Admit: 2023-05-04 | Discharge: 2023-05-04 | Disposition: A | Discharge status: Home / Self Care | Payer: Medicare PPO | Source: Ambulatory Visit | Attending: Family Medicine | Admitting: Family Medicine

## 2023-05-04 ENCOUNTER — Encounter (HOSPITAL_COMMUNITY): Payer: Self-pay

## 2023-05-04 VITALS — BP 142/88 | HR 73 | Ht 64.0 in | Wt 220.0 lb

## 2023-05-04 DIAGNOSIS — I5032 Chronic diastolic (congestive) heart failure: Secondary | ICD-10-CM | POA: Insufficient documentation

## 2023-05-04 DIAGNOSIS — J449 Chronic obstructive pulmonary disease, unspecified: Secondary | ICD-10-CM | POA: Insufficient documentation

## 2023-05-04 DIAGNOSIS — I959 Hypotension, unspecified: Secondary | ICD-10-CM | POA: Diagnosis not present

## 2023-05-04 DIAGNOSIS — I130 Hypertensive heart and chronic kidney disease with heart failure and stage 1 through stage 4 chronic kidney disease, or unspecified chronic kidney disease: Secondary | ICD-10-CM | POA: Insufficient documentation

## 2023-05-04 DIAGNOSIS — Z6837 Body mass index (BMI) 37.0-37.9, adult: Secondary | ICD-10-CM | POA: Diagnosis not present

## 2023-05-04 DIAGNOSIS — N1832 Chronic kidney disease, stage 3b: Secondary | ICD-10-CM | POA: Diagnosis not present

## 2023-05-04 DIAGNOSIS — E669 Obesity, unspecified: Secondary | ICD-10-CM

## 2023-05-04 DIAGNOSIS — I2510 Atherosclerotic heart disease of native coronary artery without angina pectoris: Secondary | ICD-10-CM | POA: Insufficient documentation

## 2023-05-04 DIAGNOSIS — I272 Pulmonary hypertension, unspecified: Secondary | ICD-10-CM | POA: Diagnosis not present

## 2023-05-04 DIAGNOSIS — G4733 Obstructive sleep apnea (adult) (pediatric): Secondary | ICD-10-CM | POA: Diagnosis not present

## 2023-05-04 DIAGNOSIS — I10 Essential (primary) hypertension: Secondary | ICD-10-CM

## 2023-05-04 DIAGNOSIS — Z951 Presence of aortocoronary bypass graft: Secondary | ICD-10-CM | POA: Insufficient documentation

## 2023-05-04 DIAGNOSIS — Z79899 Other long term (current) drug therapy: Secondary | ICD-10-CM | POA: Diagnosis not present

## 2023-05-04 DIAGNOSIS — Z7985 Long-term (current) use of injectable non-insulin antidiabetic drugs: Secondary | ICD-10-CM | POA: Diagnosis not present

## 2023-05-04 LAB — BASIC METABOLIC PANEL
Anion gap: 11 (ref 5–15)
BUN: 33 mg/dL — ABNORMAL HIGH (ref 8–23)
CO2: 25 mmol/L (ref 22–32)
Calcium: 9.8 mg/dL (ref 8.9–10.3)
Chloride: 101 mmol/L (ref 98–111)
Creatinine, Ser: 2 mg/dL — ABNORMAL HIGH (ref 0.44–1.00)
GFR, Estimated: 27 mL/min — ABNORMAL LOW (ref >60)
Glucose, Bld: 130 mg/dL — ABNORMAL HIGH (ref 70–99)
Potassium: 4.1 mmol/L (ref 3.5–5.1)
Sodium: 137 mmol/L (ref 135–145)

## 2023-05-04 LAB — BRAIN NATRIURETIC PEPTIDE: B Natriuretic Peptide: 759.2 pg/mL — ABNORMAL HIGH (ref 0.0–100.0)

## 2023-05-04 NOTE — Patient Instructions (Addendum)
 Thank you for coming in today  If you had labs drawn today, any labs that are abnormal the clinic will call you No news is good news  Medications: No changes  Follow up appointments:  Your physician recommends that you schedule a follow-up appointment in:  06/23/2023 11:40 am With Dr. Julane Ny    Do the following things EVERYDAY: Weigh yourself in the morning before breakfast. Write it down and keep it in a log. Take your medicines as prescribed Eat low salt foods--Limit salt (sodium) to 2000 mg per day.  Stay as active as you can everyday Limit all fluids for the day to less than 2 liters   At the [facility], you and your health needs are our priority. As part of our continuing mission to provide you with exceptional heart care, we have created designated Provider Care Teams. These Care Teams include your primary Cardiologist (physician) and Advanced Practice Providers (APPs- Physician Assistants and Nurse Practitioners) who all work together to provide you with the care you need, when you need it.   You may see any of the following providers on your designated Care Team at your next follow up: Dr Jules Oar Dr Peder Bourdon Dr. Mimi Alt, NP Ruddy Corral, [place] [facility] [place], [place] Dennise Fitz, NP Luster Salters, PharmD   Please be sure to bring in all your medications bottles to every appointment.    Thank you for choosing [facility]  If you have any questions or concerns before your next appointment please send us  a message through [facility] or call our office at [phone].    TO LEAVE A MESSAGE FOR THE NURSE SELECT OPTION 2, PLEASE LEAVE A MESSAGE INCLUDING: YOUR NAME DATE OF BIRTH CALL BACK NUMBER REASON FOR CALL**this is important as we prioritize the call backs  YOU WILL RECEIVE A CALL BACK THE SAME DAY AS LONG AS YOU CALL BEFORE 4:00 PM

## 2023-05-05 ENCOUNTER — Telehealth (HOSPITAL_COMMUNITY): Payer: Self-pay

## 2023-05-05 DIAGNOSIS — I5032 Chronic diastolic (congestive) heart failure: Secondary | ICD-10-CM

## 2023-05-05 NOTE — Telephone Encounter (Signed)
-----   Message from Jacklynn Ganong sent at 05/04/2023  4:59 PM EST ----- Renal function up a bit from baseline.  Repeat BMET in 2 weeks to follow

## 2023-05-05 NOTE — Telephone Encounter (Signed)
Patient's lab order placed and appointment scheduled. Pt aware, agreeable, and verbalized understanding.

## 2023-05-09 ENCOUNTER — Telehealth: Payer: Self-pay

## 2023-05-09 NOTE — Telephone Encounter (Signed)
Patient contacted to schedule appointment for Follow-up A1C,.  Appointment for 02/242025.  patient to bring BS monitor and or log to appointment.

## 2023-05-11 ENCOUNTER — Ambulatory Visit: Payer: Medicare PPO | Attending: Family Medicine | Admitting: Family Medicine

## 2023-05-11 ENCOUNTER — Ambulatory Visit: Payer: Medicare PPO | Admitting: Family Medicine

## 2023-05-11 ENCOUNTER — Encounter: Payer: Self-pay | Admitting: Family Medicine

## 2023-05-11 VITALS — BP 118/73 | HR 78 | Ht 64.0 in | Wt 230.6 lb

## 2023-05-11 DIAGNOSIS — Z7984 Long term (current) use of oral hypoglycemic drugs: Secondary | ICD-10-CM | POA: Diagnosis not present

## 2023-05-11 DIAGNOSIS — E785 Hyperlipidemia, unspecified: Secondary | ICD-10-CM

## 2023-05-11 DIAGNOSIS — L304 Erythema intertrigo: Secondary | ICD-10-CM

## 2023-05-11 DIAGNOSIS — Z794 Long term (current) use of insulin: Secondary | ICD-10-CM | POA: Diagnosis not present

## 2023-05-11 DIAGNOSIS — E1169 Type 2 diabetes mellitus with other specified complication: Secondary | ICD-10-CM

## 2023-05-11 DIAGNOSIS — J438 Other emphysema: Secondary | ICD-10-CM

## 2023-05-11 DIAGNOSIS — I5032 Chronic diastolic (congestive) heart failure: Secondary | ICD-10-CM

## 2023-05-11 DIAGNOSIS — E1159 Type 2 diabetes mellitus with other circulatory complications: Secondary | ICD-10-CM | POA: Diagnosis not present

## 2023-05-11 DIAGNOSIS — Z7985 Long-term (current) use of injectable non-insulin antidiabetic drugs: Secondary | ICD-10-CM

## 2023-05-11 LAB — POCT GLYCOSYLATED HEMOGLOBIN (HGB A1C): HbA1c, POC (controlled diabetic range): 6.4 % (ref 0.0–7.0)

## 2023-05-11 MED ORDER — SEMAGLUTIDE (2 MG/DOSE) 8 MG/3ML ~~LOC~~ SOPN: 2.0000 mg | PEN_INJECTOR | SUBCUTANEOUS | 1 refills | Status: DC

## 2023-05-11 MED ORDER — OZEMPIC (0.25 OR 0.5 MG/DOSE) 2 MG/3ML ~~LOC~~ SOPN: 0.5000 mg | PEN_INJECTOR | SUBCUTANEOUS | 0 refills | Status: DC

## 2023-05-11 MED ORDER — TOUJEO MAX SOLOSTAR 300 UNIT/ML ~~LOC~~ SOPN: 50.0000 [IU] | PEN_INJECTOR | Freq: Every day | SUBCUTANEOUS | 6 refills | Status: DC

## 2023-05-11 MED ORDER — CLOTRIMAZOLE 1 % EX CREA: 1.0000 | TOPICAL_CREAM | Freq: Two times a day (BID) | CUTANEOUS | 1 refills | Status: DC

## 2023-05-11 MED ORDER — SEMAGLUTIDE (1 MG/DOSE) 4 MG/3ML ~~LOC~~ SOPN: 1.0000 mg | PEN_INJECTOR | SUBCUTANEOUS | 0 refills | Status: DC

## 2023-05-11 NOTE — Progress Notes (Signed)
Subjective:  Patient ID: Deborah Coffey, female    DOB: 13-Dec-1955  Age: 68 y.o. MRN: [id number]  CC: Medical Management of Chronic Issues   HPI Deborah Coffey is a 68 y.o. year old female with a history of Type 2 DM (A1c 6.4), Diabetic neuropathy, Hypertension, CAD (s/p CABG), pulmonary hypertension with RV strain (EF 60 to 65%, enlarged right RV, severely elevated PA pressure, severely dilated RA from echo of 02/2023). here for a follow-up visit.  Interval History: Discussed the use of AI scribe software for clinical note transcription with the patient, who gave verbal consent to proceed.  She presents for a follow-up visit. She reports no significant weight loss despite using Ozempic, but notes that her blood sugar levels have improved. Her most recent A1c is 6.4, down from 8.2. She has not experienced any adverse effects from the Ozempic.  The patient also mentions a rash under her breasts, which she has not experienced in years. She requests a refill of her cream, which she uses for the rash.  She denies any difficulty breathing and does not believe she has asthma, despite being prescribed Symbicort by a pulmonologist.  Review of her CT scan indicates presence of emphysema noted per the nurse practitioner from pulmonary's note she was placed on Symbicort and albuterol. She had a visit with the cardiology NP last week at the CHF clinic last week.  Lastly, the patient admits to smoking, but not nicotine. She expresses a desire to quit and has been prescribed Wellbutrin to assist with this but has not been taking it.       Past Medical History:  Diagnosis Date   Allergy    Anemia    CAD in native artery 02/18/2004   Mid LAD lesoin just after Large D1 --> CABG x 1 LIMA-LAD   CAP (community acquired pneumonia) 08/07/2014   Chronic kidney disease, stage 3b (HCC)    Depression    High cholesterol    History of blood transfusion 04/20/2003   "related to [facility]"     Hypertension    Lung nodules    Mild carotid artery disease (HCC)    Pulmonary hypertension (HCC)    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 competitive flow   TIA (transient ischemic attack)    Type II diabetes mellitus (HCC)     Past Surgical History:  Procedure Laterality Date   CARDIAC CATHETERIZATION  2010   Previous LAD 95% lesion - now ~30-40%; patent LIMA with competitive flow   CESAREAN SECTION  1977; 1989   CHOLECYSTECTOMY  ~ 2012   CORONARY ARTERY BYPASS GRAFT  02/2004   "CABG X1" (03/22/2013); LIMA-LAD   RIGHT/LEFT HEART CATH AND CORONARY/GRAFT ANGIOGRAPHY N/A 04/16/2021   Procedure: RIGHT/LEFT HEART CATH AND CORONARY/GRAFT ANGIOGRAPHY;  Surgeon: Corky Crafts, MD;  Location: [facility];  Service: Cardiovascular;  Laterality: N/A;   TUBAL LIGATION  1989    Family History  Problem Relation Age of Onset   Heart disease Mother    Diabetes Mother    Cancer Mother        cervical   Diabetes Father    Diabetes Sister    Heart disease Sister    Lupus Sister    Heart failure Sister    Hepatitis C Sister    Hypertension Daughter    Diabetes Brother    Heart attack Brother    Heart failure Brother  Diabetes Brother    Colon cancer Neg Hx    Breast cancer Neg Hx     Social History   Socioeconomic History   Marital status: Divorced    Spouse name: Not on file   Number of children: Not on file   Years of education: 12   Highest education level: Some college, no degree  Occupational History   Occupation: Disabled  Tobacco Use   Smoking status: Former    Current packs/day: 0.25    Average packs/day: 0.3 packs/day for 42.0 years (10.5 ttl pk-yrs)    Types: Cigarettes   Smokeless tobacco: Never   Tobacco comments:    Smokes 1-3 cigars/ black cigarettes a week. Agreed to referral to [facility] quit line. Referral faxed. Patient given cessation information  Vaping Use   Vaping status: Some Days    Substances: Flavoring  Substance and Sexual Activity   Alcohol use: Not Currently   Drug use: No    Types: Marijuana    Comment: smoked pot years ago but not currently   Sexual activity: Not Currently  Other Topics Concern   Not on file  Social History Narrative   Lives with daughter and 3 grandchildren   Social Drivers of Health   Financial Resource Strain: Low Risk  (08/19/2022)   Overall Financial Resource Strain (CARDIA)    Difficulty of Paying Living Expenses: Not hard at all  Food Insecurity: No Food Insecurity (08/19/2022)   Hunger Vital Sign    Worried About Running Out of Food in the Last Year: Never true    Ran Out of Food in the Last Year: Never true  Transportation Needs: No Transportation Needs (08/19/2022)   PRAPARE - Administrator, Civil Service (Medical): No    Lack of Transportation (Non-Medical): No  Physical Activity: Inactive (08/19/2022)   Exercise Vital Sign    Days of Exercise per Week: 0 days    Minutes of Exercise per Session: 0 min  Stress: No Stress Concern Present (08/19/2022)   [employer] of Occupational Health - Occupational Stress Questionnaire    Feeling of Stress : Not at all  Social Connections: Not on file    Allergies  Allergen Reactions   Ciprofloxacin Rash    Outpatient Medications Prior to Visit  Medication Sig Dispense Refill   acetaminophen (TYLENOL) 500 MG tablet Take 1,000 mg by mouth every 6 (six) hours as needed for headache (pain).      Alcohol Swabs (B-D SINGLE USE SWABS REGULAR) PADS USE AS DIRECTED 100 each 0   aspirin EC 81 MG tablet Take 81 mg by mouth daily.     atorvastatin (LIPITOR) 10 MG tablet Take 1 tablet (10 mg total) by mouth once daily. 90 tablet 1   Blood Glucose Monitoring Suppl (ONETOUCH VERIO) w/Device KIT Use as instructed to test blood glucose 3 times daily. 1 kit 0   dapagliflozin propanediol (FARXIGA) 10 MG TABS tablet Take 1 tablet (10 mg total) by mouth daily before breakfast. 90 tablet 1    diclofenac Sodium (VOLTAREN) 1 % GEL Apply 2 g topically daily as needed (muscle pain). 150 g 1   furosemide (LASIX) 80 MG tablet Take 1 tablet by mouth twice a day 60 tablet 6   gabapentin (NEURONTIN) 300 MG capsule Take orally 2 caps (600 mg) in the morning and 3 capsules (900 mg) in the evening 450 capsule 1   glucose blood (ONETOUCH VERIO) test strip Use as instructed to test blood glucose 3 times  daily. 100 each 3   Insulin Pen Needle (DROPLET PEN NEEDLES) 31G X 8 MM MISC USE WITH TOUJEO SOLOSTAR PEN EVERY DAY 100 each 0   Lancets (ONETOUCH DELICA PLUS LANCET33G) MISC Use as instructed to test blood glucose 3 times daily. 100 each 3   metFORMIN (GLUCOPHAGE-XR) 500 MG 24 hr tablet Take 2 tablets (1,000 mg total) by mouth 2 (two) times daily. 360 tablet 1   metoprolol tartrate (LOPRESSOR) 50 MG tablet Take 1 tablet (50 mg total) by mouth 2 (two) times daily. 180 tablet 1   Multiple Vitamin (MULTIVITAMIN WITH MINERALS) TABS tablet Take 1 tablet by mouth daily.     nitroGLYCERIN (NITROSTAT) 0.4 MG SL tablet Place 1 tablet (0.4 mg total) under the tongue every 5 (five) minutes as needed for chest pain. 25 tablet 0   pantoprazole (PROTONIX) 40 MG tablet Take 1 tablet (40 mg total) by mouth 2 (two) times daily. 180 tablet 1   insulin glargine, 2 Unit Dial, (TOUJEO MAX SOLOSTAR) 300 UNIT/ML Solostar Pen Inject 64 Units into the skin daily. 6 mL 6   Semaglutide,0.25 or 0.5MG /DOS, (OZEMPIC, 0.25 OR 0.5 MG/DOSE,) 2 MG/1.5ML SOPN Inject 0.25 mg into the skin once a week. (Patient taking differently: Inject 0.25 mg into the skin once a week. Thursadys) 2 mL 6   albuterol (VENTOLIN HFA) 108 (90 Base) MCG/ACT inhaler Inhale 2 puffs into the lungs every 6 (six) hours as needed for wheezing or shortness of breath. (Patient not taking: Reported on 05/11/2023) 8 g 6   budesonide-formoterol (SYMBICORT) 80-4.5 MCG/ACT inhaler Inhale 2 puffs into the lungs in the morning and at bedtime. (Patient not taking: Reported on  05/11/2023) 20.4 each 5   buPROPion (WELLBUTRIN SR) 150 MG 12 hr tablet Take 1 tablet (150 mg total) by mouth 2 (two) times daily. (Patient not taking: Reported on 05/11/2023) 60 tablet 5   Continuous Glucose Sensor (DEXCOM G6 SENSOR) MISC Use to check blood sugar three times daily. Dx. E11.59 (Patient not taking: Reported on 05/11/2023) 3 each 3   clobetasol cream (TEMOVATE) 0.05 % Apply topically 2 (two) times daily. (Patient not taking: Reported on 05/11/2023) 60 g 0   No facility-administered medications prior to visit.     ROS Review of Systems  Constitutional:  Negative for activity change and appetite change.  HENT:  Negative for sinus pressure and sore throat.   Respiratory:  Negative for chest tightness, shortness of breath and wheezing.   Cardiovascular:  Negative for chest pain and palpitations.  Gastrointestinal:  Negative for abdominal distention, abdominal pain and constipation.  Genitourinary: Negative.   Musculoskeletal: Negative.   Psychiatric/Behavioral:  Negative for behavioral problems and dysphoric mood.     Objective:  BP 118/73   Pulse 78   Ht 5\' 4"  (1.626 m)   Wt 230 lb 9.6 oz (104.6 kg)   LMP  (LMP Unknown)   SpO2 99%   BMI 39.58 kg/m      05/11/2023    1:44 PM 05/04/2023    2:56 PM 03/24/2023   10:59 AM  BP/Weight  Systolic BP 118 142 128  Diastolic BP 73 88 70  Wt. (Lbs) 230.6 220 227  BMI 39.58 kg/m2 37.76 kg/m2 37.77 kg/m2      Physical Exam Constitutional:      Appearance: She is well-developed.  Cardiovascular:     Rate and Rhythm: Normal rate.     Heart sounds: Normal heart sounds. No murmur heard. Pulmonary:     Effort: Pulmonary  effort is normal.     Breath sounds: Normal breath sounds. No wheezing or rales.  Chest:     Chest wall: No tenderness.  Abdominal:     General: Bowel sounds are normal. There is no distension.     Palpations: Abdomen is soft. There is no mass.     Tenderness: There is no abdominal tenderness.   Musculoskeletal:        General: Normal range of motion.     Right lower leg: No edema.     Left lower leg: No edema.  Neurological:     Mental Status: She is alert and oriented to person, place, and time.  Psychiatric:        Mood and Affect: Mood normal.        Latest Ref Rng & Units 05/04/2023    3:45 PM 04/01/2023   12:18 PM 03/24/2023   11:44 AM  CMP  Glucose 70 - 99 mg/dL 564  332  52   BUN 8 - 23 mg/dL 33  33  22   Creatinine 0.44 - 1.00 mg/dL 9.51  8.84  1.66   Sodium 135 - 145 mmol/L 137  137  142   Potassium 3.5 - 5.1 mmol/L 4.1  4.7  4.4   Chloride 98 - 111 mmol/L 101  103  105   CO2 22 - 32 mmol/L 25  26  27    Calcium 8.9 - 10.3 mg/dL 9.8  9.4  9.7     Lipid Panel     Component Value Date/Time   CHOL 161 01/21/2021 1618   TRIG 104 01/21/2021 1618   HDL 72 01/21/2021 1618   CHOLHDL 2.2 01/21/2021 1618   CHOLHDL 2.2 09/02/2016 0533   VLDL 13 09/02/2016 0533   LDLCALC 70 01/21/2021 1618    CBC    Component Value Date/Time   WBC 6.9 03/06/2023 1719   RBC 4.57 03/06/2023 1719   HGB 12.9 03/06/2023 1719   HGB 12.1 04/10/2021 1248   HCT 42.3 03/06/2023 1719   HCT 37.0 04/10/2021 1248   PLT 279 03/06/2023 1719   PLT 261 04/10/2021 1248   MCV 92.6 03/06/2023 1719   MCV 83 04/10/2021 1248   MCH 28.2 03/06/2023 1719   MCHC 30.5 03/06/2023 1719   RDW 16.0 (H) 03/06/2023 1719   RDW 13.7 04/10/2021 1248   LYMPHSABS 1.5 12/18/2021 1230   LYMPHSABS 1.9 01/21/2021 1618   MONOABS 0.7 12/18/2021 1230   EOSABS 0.1 12/18/2021 1230   EOSABS 0.1 01/21/2021 1618   BASOSABS 0.0 12/18/2021 1230   BASOSABS 0.0 01/21/2021 1618    Lab Results  Component Value Date   HGBA1C 6.4 05/11/2023    Assessment & Plan:      Type 2 Diabetes Mellitus Improved glycemic control with A1c of 6.4, down from 8.2. Patient reports no significant weight loss despite Ozempic use. No adverse effects reported. -Increase Ozempic to 0.5mg  weekly for 4 weeks, then to 1mg  weekly for 4  weeks, and finally to 2mg  weekly. -Reduce Lantus from 54 units to 50 units daily as Ozempic dose increases and advised to decrease further to 45 units when she goes up to 1 mg of Ozempic and then to 40 units when she goes up to 2 mg of Ozempic. -Check blood glucose levels regularly to monitor response to medication changes.  Emphysema Patient reports no difficulty breathing. CT scan shows emphysema. Patient continues to smoke. -Discuss inhaler use and effectiveness at next pulmonology appointment. -Encourage smoking cessation.  Rash under breasts/ intertrigo Patient reports occasional rash under breasts. No current rash present. -Prescribe antifungal cream for use as needed.  Hyperlipidemia Control -Continue statin  CHF/pulmonary hypertension -Euvolemic with a EF 60 to 65% from echo of 02/2023 -Continue to follow-up with cardiology -Continue SGLT2i, furosemide, beta-blocker  Follow-up in 3 months to assess response to medication changes and overall health status.          Meds ordered this encounter  Medications   insulin glargine, 2 Unit Dial, (TOUJEO MAX SOLOSTAR) 300 UNIT/ML Solostar Pen    Sig: Inject 50 Units into the skin daily.    Dispense:  6 mL    Refill:  6    Dose decrease   Semaglutide, 1 MG/DOSE, 4 MG/3ML SOPN    Sig: Inject 1 mg as directed once a week. For 4 weeks then increase to 2 mg.    Dispense:  3 mL    Refill:  0   Semaglutide, 2 MG/DOSE, 8 MG/3ML SOPN    Sig: Inject 2 mg as directed once a week.    Dispense:  3 mL    Refill:  1   Semaglutide,0.25 or 0.5MG /DOS, (OZEMPIC, 0.25 OR 0.5 MG/DOSE,) 2 MG/3ML SOPN    Sig: Inject 0.5 mg into the skin once a week. For 4 weeks then increase to 1mg     Dispense:  3 mL    Refill:  0   clotrimazole (LOTRIMIN) 1 % cream    Sig: Apply 1 Application topically 2 (two) times daily.    Dispense:  85 g    Refill:  1    Follow-up: Return in about 3 months (around 08/09/2023) for Chronic medical conditions.        Hoy Register, MD, FAAFP. [facility] [place], [place] [phone]   05/11/2023, 4:04 PM

## 2023-05-11 NOTE — Patient Instructions (Addendum)
VISIT SUMMARY:  During your follow-up visit, we discussed your diabetes management, a rash under your breasts, your experience with lisinopril, and your smoking habits. Your blood sugar levels have improved significantly, and we have adjusted your medications accordingly. We also addressed your concerns about the rash and your desire to quit smoking.  YOUR PLAN:  -TYPE 2 DIABETES MELLITUS: Type 2 Diabetes Mellitus is a condition where your body does not use insulin properly, leading to high blood sugar levels. Your A1c has improved to 6.4 from 8.2. We will increase your Ozempic dose gradually over the next 8 weeks and reduce your Lantus dose to 50 units daily. Please check your blood glucose levels regularly to monitor your response to these changes.  When you increase Ozempic to 1mg  please decrease your Lantus from 50 units to 45 units and when you go up to 2 mg on Ozempic decrease down to 35 units of Lantus.  -EMPHYSEMA: Emphysema is a lung condition that causes shortness of breath. Although you are not experiencing difficulty breathing, it is important to discuss your inhaler use and effectiveness at your next pulmonology appointment. We strongly encourage you to quit smoking to prevent further lung damage.  -RASH UNDER BREASTS: The rash under your breasts is likely a fungal infection. We have prescribed an antifungal cream for you to use as needed.  INSTRUCTIONS:  Please follow up in 3 months to assess your response to the medication changes and your overall health status.

## 2023-05-16 ENCOUNTER — Ambulatory Visit: Payer: Medicare PPO | Admitting: Nurse Practitioner

## 2023-05-19 ENCOUNTER — Ambulatory Visit (HOSPITAL_COMMUNITY)
Admission: RE | Admit: 2023-05-19 | Discharge: 2023-05-19 | Disposition: A | Discharge status: Home / Self Care | Payer: Medicare PPO | Source: Ambulatory Visit | Attending: Internal Medicine | Admitting: Internal Medicine

## 2023-05-19 DIAGNOSIS — I5032 Chronic diastolic (congestive) heart failure: Secondary | ICD-10-CM | POA: Diagnosis present

## 2023-05-19 LAB — BASIC METABOLIC PANEL
Anion gap: 14 (ref 5–15)
BUN: 37 mg/dL — ABNORMAL HIGH (ref 8–23)
CO2: 26 mmol/L (ref 22–32)
Calcium: 9.6 mg/dL (ref 8.9–10.3)
Chloride: 99 mmol/L (ref 98–111)
Creatinine, Ser: 1.96 mg/dL — ABNORMAL HIGH (ref 0.44–1.00)
GFR, Estimated: 28 mL/min — ABNORMAL LOW (ref >60)
Glucose, Bld: 60 mg/dL — ABNORMAL LOW (ref 70–99)
Potassium: 4 mmol/L (ref 3.5–5.1)
Sodium: 139 mmol/L (ref 135–145)

## 2023-05-20 ENCOUNTER — Telehealth (HOSPITAL_COMMUNITY): Payer: Self-pay | Admitting: Cardiology

## 2023-05-20 DIAGNOSIS — I5032 Chronic diastolic (congestive) heart failure: Secondary | ICD-10-CM

## 2023-05-20 MED ORDER — FUROSEMIDE 80 MG PO TABS: ORAL_TABLET | ORAL | 6 refills | Status: DC

## 2023-05-20 NOTE — Telephone Encounter (Signed)
 Patient called.  Patient aware.

## 2023-05-20 NOTE — Telephone Encounter (Signed)
-----   Message from Derusha [place] [facility] sent at 05/20/2023 12:49 PM EST ----- Renal function remains elevated.  Decrease Lasix to 80/40 (previously on 80 bid).  Repeat BMET in 10 days

## 2023-05-23 ENCOUNTER — Ambulatory Visit
Admission: RE | Admit: 2023-05-23 | Discharge: 2023-05-23 | Disposition: A | Discharge status: Home / Self Care | Payer: Medicare PPO | Source: Ambulatory Visit | Attending: Family Medicine | Admitting: Family Medicine

## 2023-05-23 ENCOUNTER — Other Ambulatory Visit: Payer: Self-pay | Admitting: Family Medicine

## 2023-05-23 DIAGNOSIS — N6489 Other specified disorders of breast: Secondary | ICD-10-CM

## 2023-05-24 ENCOUNTER — Encounter: Payer: Self-pay | Admitting: Family Medicine

## 2023-05-30 ENCOUNTER — Ambulatory Visit (HOSPITAL_COMMUNITY)
Admission: RE | Admit: 2023-05-30 | Discharge: 2023-05-30 | Disposition: A | Discharge status: Home / Self Care | Payer: Medicare PPO | Source: Ambulatory Visit | Attending: Cardiology | Admitting: Cardiology

## 2023-05-30 DIAGNOSIS — I5032 Chronic diastolic (congestive) heart failure: Secondary | ICD-10-CM | POA: Insufficient documentation

## 2023-05-30 LAB — BASIC METABOLIC PANEL
Anion gap: 16 — ABNORMAL HIGH (ref 5–15)
BUN: 28 mg/dL — ABNORMAL HIGH (ref 8–23)
CO2: 22 mmol/L (ref 22–32)
Calcium: 10 mg/dL (ref 8.9–10.3)
Chloride: 101 mmol/L (ref 98–111)
Creatinine, Ser: 1.91 mg/dL — ABNORMAL HIGH (ref 0.44–1.00)
GFR, Estimated: 29 mL/min — ABNORMAL LOW (ref >60)
Glucose, Bld: 82 mg/dL (ref 70–99)
Potassium: 4.1 mmol/L (ref 3.5–5.1)
Sodium: 139 mmol/L (ref 135–145)

## 2023-06-13 ENCOUNTER — Ambulatory Visit: Payer: Medicare PPO | Admitting: Pharmacist

## 2023-06-23 ENCOUNTER — Encounter: Payer: Self-pay | Admitting: Nurse Practitioner

## 2023-06-23 ENCOUNTER — Ambulatory Visit: Payer: Medicare PPO | Admitting: Nurse Practitioner

## 2023-06-23 ENCOUNTER — Inpatient Hospital Stay (HOSPITAL_COMMUNITY): Admission: RE | Admit: 2023-06-23 | Payer: Medicare PPO | Source: Ambulatory Visit | Admitting: Internal Medicine

## 2023-06-24 ENCOUNTER — Ambulatory Visit (HOSPITAL_COMMUNITY)
Admission: RE | Admit: 2023-06-24 | Discharge: 2023-06-24 | Disposition: A | Discharge status: Home / Self Care | Source: Ambulatory Visit | Attending: Internal Medicine | Admitting: Internal Medicine

## 2023-06-24 ENCOUNTER — Other Ambulatory Visit (HOSPITAL_COMMUNITY): Payer: Self-pay

## 2023-06-24 ENCOUNTER — Encounter (HOSPITAL_COMMUNITY): Payer: Self-pay | Admitting: Internal Medicine

## 2023-06-24 VITALS — BP 124/80 | HR 83 | Wt 223.0 lb

## 2023-06-24 DIAGNOSIS — Z951 Presence of aortocoronary bypass graft: Secondary | ICD-10-CM | POA: Diagnosis not present

## 2023-06-24 DIAGNOSIS — I10 Essential (primary) hypertension: Secondary | ICD-10-CM | POA: Diagnosis not present

## 2023-06-24 DIAGNOSIS — J449 Chronic obstructive pulmonary disease, unspecified: Secondary | ICD-10-CM | POA: Diagnosis not present

## 2023-06-24 DIAGNOSIS — Z7985 Long-term (current) use of injectable non-insulin antidiabetic drugs: Secondary | ICD-10-CM | POA: Diagnosis not present

## 2023-06-24 DIAGNOSIS — N1832 Chronic kidney disease, stage 3b: Secondary | ICD-10-CM | POA: Diagnosis not present

## 2023-06-24 DIAGNOSIS — I5032 Chronic diastolic (congestive) heart failure: Secondary | ICD-10-CM | POA: Insufficient documentation

## 2023-06-24 DIAGNOSIS — R918 Other nonspecific abnormal finding of lung field: Secondary | ICD-10-CM | POA: Diagnosis not present

## 2023-06-24 DIAGNOSIS — I2781 Cor pulmonale (chronic): Secondary | ICD-10-CM | POA: Insufficient documentation

## 2023-06-24 DIAGNOSIS — Z6838 Body mass index (BMI) 38.0-38.9, adult: Secondary | ICD-10-CM | POA: Insufficient documentation

## 2023-06-24 DIAGNOSIS — Z8673 Personal history of transient ischemic attack (TIA), and cerebral infarction without residual deficits: Secondary | ICD-10-CM | POA: Insufficient documentation

## 2023-06-24 DIAGNOSIS — F32A Depression, unspecified: Secondary | ICD-10-CM | POA: Insufficient documentation

## 2023-06-24 DIAGNOSIS — I272 Pulmonary hypertension, unspecified: Secondary | ICD-10-CM | POA: Diagnosis not present

## 2023-06-24 DIAGNOSIS — Z79899 Other long term (current) drug therapy: Secondary | ICD-10-CM | POA: Diagnosis not present

## 2023-06-24 DIAGNOSIS — I130 Hypertensive heart and chronic kidney disease with heart failure and stage 1 through stage 4 chronic kidney disease, or unspecified chronic kidney disease: Secondary | ICD-10-CM | POA: Insufficient documentation

## 2023-06-24 DIAGNOSIS — E1122 Type 2 diabetes mellitus with diabetic chronic kidney disease: Secondary | ICD-10-CM | POA: Insufficient documentation

## 2023-06-24 DIAGNOSIS — Z7984 Long term (current) use of oral hypoglycemic drugs: Secondary | ICD-10-CM | POA: Insufficient documentation

## 2023-06-24 DIAGNOSIS — F17290 Nicotine dependence, other tobacco product, uncomplicated: Secondary | ICD-10-CM | POA: Insufficient documentation

## 2023-06-24 DIAGNOSIS — Z794 Long term (current) use of insulin: Secondary | ICD-10-CM | POA: Diagnosis not present

## 2023-06-24 DIAGNOSIS — G4733 Obstructive sleep apnea (adult) (pediatric): Secondary | ICD-10-CM | POA: Diagnosis not present

## 2023-06-24 DIAGNOSIS — I2510 Atherosclerotic heart disease of native coronary artery without angina pectoris: Secondary | ICD-10-CM | POA: Diagnosis not present

## 2023-06-24 DIAGNOSIS — E782 Mixed hyperlipidemia: Secondary | ICD-10-CM | POA: Insufficient documentation

## 2023-06-24 LAB — CBC
HCT: 43.3 % (ref 36.0–46.0)
Hemoglobin: 13.7 g/dL (ref 12.0–15.0)
MCH: 28.1 pg (ref 26.0–34.0)
MCHC: 31.6 g/dL (ref 30.0–36.0)
MCV: 88.9 fL (ref 80.0–100.0)
Platelets: 261 10*3/uL (ref 150–400)
RBC: 4.87 MIL/uL (ref 3.87–5.11)
RDW: 16.8 % — ABNORMAL HIGH (ref 11.5–15.5)
WBC: 6.7 10*3/uL (ref 4.0–10.5)
nRBC: 0 % (ref 0.0–0.2)

## 2023-06-24 LAB — BASIC METABOLIC PANEL
Anion gap: 12 (ref 5–15)
BUN: 26 mg/dL — ABNORMAL HIGH (ref 8–23)
CO2: 24 mmol/L (ref 22–32)
Calcium: 9.7 mg/dL (ref 8.9–10.3)
Chloride: 103 mmol/L (ref 98–111)
Creatinine, Ser: 1.57 mg/dL — ABNORMAL HIGH (ref 0.44–1.00)
GFR, Estimated: 36 mL/min — ABNORMAL LOW (ref >60)
Glucose, Bld: 67 mg/dL — ABNORMAL LOW (ref 70–99)
Potassium: 4.1 mmol/L (ref 3.5–5.1)
Sodium: 139 mmol/L (ref 135–145)

## 2023-06-24 NOTE — Progress Notes (Signed)
 Patient did hallway walk oxygen saturation started @ 93% on room air and ended @90 %. HR 81-123

## 2023-06-24 NOTE — Progress Notes (Signed)
 ADVANCED HF CLINIC NOTE  PCP: Hoy Register, MD Primary Cardiologist: Donato Schultz, MD HF Cardiologist: Dr. Gala Romney  CC: HF follow up  HPI: Deborah Coffey is a 68 y.o. with CAD s/p CABG x1 (LIMA-LAD) 2005, tobacco use, chronic diastolic CHF, anemia, depression, HLD, HTN, DM, CKD stage 3b, prior TIAs, pulmonary HTN (felt due to underlying lung disease/smoking), and morbid obesity.    Echo12/22 EF 65-70% RV moderately dilated with moderate HK. Flattened septum Moderate TR RVSP est 110 mmHG    Chest CT scan 2/22. No PE   R/L cath on 04/16/21: - mLAD 75% LIMA to LAD is widely patent. pRCA 50% - EF 65% - RA 5 PA 79/18 (39) PCW 12  CO 6.4 L/min; CI PVR 4.2  Sleep study 06/2021 only c/w mild OSA AHI 5.5   Echo 11/24 EF 60-65%, IVC flattened in systole and diastole, c/w RV pressure and volume overload.  RV severely enlarged w/ severely reduced systolic function. The estimated RVSP is 92.5 mmHg (comparred to 111 mmHg previous study). RA severely dilated. Moderate TR    PFTs 12/24: FEV1 1.27 (50%) FVC 1.84 (55),ratio 77, DLCOcor 48. Moderately severe restriction with reversibility (12% change) and severe diffusion defect. She is being followed by [facility] (Dr. Tonia Brooms). He has also being following her for pulmonary nodules.   Echo 11/24 EF 60-65% RV mod reduced RVSP 92 Personally reviewed  Returns for HF follow-up. Says she feels pretty good but does have SOB with mild exertion. Able to do ADLs if she takes her time. Occasional presyncope. Mild ankle edema. On Ozempic but not losing much weight. Not on CPAP.   Cardiac Studies:  - Echo 11/24 EF 60-65%, IVC flattened in systole and diastole, c/w RV pressure and volume overload,  RV severely enlarged w/ severely reduced systolic function.   - PFTs 2/23 FEV1 1.27 (61%) FVC 1.63 (61%) DLCO 43%  - R/LHC (12/22): EF 65%, mLAD 75% LIMA to LAD is widely patent. pRCA 50; RA 5 PA 79/18 (39) PCW 12  CO 6.4 L/min; CI PVR 4.2  -  Echo (12/22): EF 65-70% RV moderately dilated with moderate HK. Flattened septum Moderate TR RVSP est    - Chest CT scan 2/22. No PE  Past Medical History:  Diagnosis Date   Allergy    Anemia    CAD in native artery 02/18/2004   Mid LAD lesoin just after Large D1 --> CABG x 1 LIMA-LAD   CAP (community acquired pneumonia) 08/07/2014   Chronic kidney disease, stage 3b (HCC)    Depression    High cholesterol    History of blood transfusion 04/20/2003   "related to OHS"    Hypertension    Lung nodules    Mild carotid artery disease (HCC)    Pulmonary hypertension (HCC)    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 competitive flow   TIA (transient ischemic attack)    Type II diabetes mellitus (HCC)    Current Outpatient Medications  Medication Sig Dispense Refill   acetaminophen (TYLENOL) 500 MG tablet Take 1,000 mg by mouth every 6 (six) hours as needed for headache (pain).      Alcohol Swabs (B-D SINGLE USE SWABS REGULAR) PADS USE AS DIRECTED 100 each 0   aspirin EC 81 MG tablet Take 81 mg by mouth daily.     atorvastatin (LIPITOR) 10 MG tablet Take 1 tablet (10 mg total) by mouth once daily. 90  tablet 1   Blood Glucose Monitoring Suppl (ONETOUCH VERIO) w/Device KIT Use as instructed to test blood glucose 3 times daily. 1 kit 0   budesonide-formoterol (SYMBICORT) 80-4.5 MCG/ACT inhaler Inhale 2 puffs into the lungs in the morning and at bedtime. 20.4 each 5   buPROPion (WELLBUTRIN SR) 150 MG 12 hr tablet Take 1 tablet (150 mg total) by mouth 2 (two) times daily. 60 tablet 5   Continuous Glucose Sensor (DEXCOM G6 SENSOR) MISC Use to check blood sugar three times daily. Dx. E11.59 3 each 3   cyanocobalamin (VITAMIN B12) 1000 MCG tablet Take 1,000 mcg by mouth daily.     dapagliflozin propanediol (FARXIGA) 10 MG TABS tablet Take 1 tablet (10 mg total) by mouth daily before breakfast. 90 tablet 1   diclofenac Sodium (VOLTAREN) 1 %  GEL Apply 2 g topically daily as needed (muscle pain). 150 g 1   furosemide (LASIX) 80 MG tablet Take 80 mg by mouth 2 (two) times daily.     gabapentin (NEURONTIN) 300 MG capsule Take 600 mg by mouth 2 (two) times daily.     glucose blood (ONETOUCH VERIO) test strip Use as instructed to test blood glucose 3 times daily. 100 each 3   Insulin Glargine (TOUJEO SOLOSTAR Rosebud) Inject 40 Units into the skin daily.     Insulin Pen Needle (DROPLET PEN NEEDLES) 31G X 8 MM MISC USE WITH TOUJEO SOLOSTAR PEN EVERY DAY 100 each 0   Lancets (ONETOUCH DELICA PLUS LANCET33G) MISC Use as instructed to test blood glucose 3 times daily. 100 each 3   metFORMIN (GLUCOPHAGE-XR) 500 MG 24 hr tablet Take 2 tablets (1,000 mg total) by mouth 2 (two) times daily. 360 tablet 1   metoprolol tartrate (LOPRESSOR) 50 MG tablet Take 1 tablet (50 mg total) by mouth 2 (two) times daily. 180 tablet 1   Multiple Vitamin (MULTIVITAMIN WITH MINERALS) TABS tablet Take 1 tablet by mouth daily.     nitroGLYCERIN (NITROSTAT) 0.4 MG SL tablet Place 1 tablet (0.4 mg total) under the tongue every 5 (five) minutes as needed for chest pain. 25 tablet 0   pantoprazole (PROTONIX) 40 MG tablet Take 1 tablet (40 mg total) by mouth 2 (two) times daily. 180 tablet 1   Semaglutide, 1 MG/DOSE, 4 MG/3ML SOPN Inject 1 mg as directed once a week. For 4 weeks then increase to 2 mg. 3 mL 0   VITAMIN D, CHOLECALCIFEROL, PO Take 1 tablet by mouth daily.     albuterol (VENTOLIN HFA) 108 (90 Base) MCG/ACT inhaler Inhale 2 puffs into the lungs every 6 (six) hours as needed for wheezing or shortness of breath. (Patient not taking: Reported on 05/04/2023) 8 g 6   clotrimazole (LOTRIMIN) 1 % cream Apply 1 Application topically 2 (two) times daily. (Patient not taking: Reported on 06/24/2023) 85 g 1   Semaglutide, 2 MG/DOSE, 8 MG/3ML SOPN Inject 2 mg as directed once a week. (Patient not taking: Reported on 06/24/2023) 3 mL 1   No current facility-administered medications  for this encounter.   Allergies  Allergen Reactions   Ciprofloxacin Rash   Social History   Socioeconomic History   Marital status: Divorced    Spouse name: Not on file   Number of children: Not on file   Years of education: 12   Highest education level: Some college, no degree  Occupational History   Occupation: Disabled  Tobacco Use   Smoking status: Former    Current packs/day: 0.25  Average packs/day: 0.3 packs/day for 42.0 years (10.5 ttl pk-yrs)    Types: Cigarettes   Smokeless tobacco: Never   Tobacco comments:    Smokes 1-3 cigars/ black cigarettes a week. Agreed to referral to [place] quit line. Referral faxed. Patient given cessation information  Vaping Use   Vaping status: Some Days   Substances: Flavoring  Substance and Sexual Activity   Alcohol use: Not Currently   Drug use: No    Types: Marijuana    Comment: smoked pot years ago but not currently   Sexual activity: Not Currently  Other Topics Concern   Not on file  Social History Narrative   Lives with daughter and 3 grandchildren   Social Drivers of Health   Financial Resource Strain: Low Risk  (08/19/2022)   Overall Financial Resource Strain (CARDIA)    Difficulty of Paying Living Expenses: Not hard at all  Food Insecurity: No Food Insecurity (08/19/2022)   Hunger Vital Sign    Worried About Running Out of Food in the Last Year: Never true    Ran Out of Food in the Last Year: Never true  Transportation Needs: No Transportation Needs (08/19/2022)   PRAPARE - Administrator, Civil Service (Medical): No    Lack of Transportation (Non-Medical): No  Physical Activity: Inactive (08/19/2022)   Exercise Vital Sign    Days of Exercise per Week: 0 days    Minutes of Exercise per Session: 0 min  Stress: No Stress Concern Present (08/19/2022)   [employer] of Occupational Health - Occupational Stress Questionnaire    Feeling of Stress : Not at all  Social Connections: Not on file  Intimate Partner  Violence: Not At Risk (02/03/2023)   Humiliation, Afraid, Rape, and Kick questionnaire    Fear of Current or Ex-Partner: No    Emotionally Abused: No    Physically Abused: No    Sexually Abused: No   Family History  Problem Relation Age of Onset   Heart disease Mother    Diabetes Mother    Cancer Mother        cervical   Diabetes Father    Diabetes Sister    Heart disease Sister    Lupus Sister    Heart failure Sister    Hepatitis C Sister    Hypertension Daughter    Diabetes Brother    Heart attack Brother    Heart failure Brother    Diabetes Brother    Colon cancer Neg Hx    Breast cancer Neg Hx    BP 124/80   Pulse 83   Wt 101.2 kg (223 lb)   LMP  (LMP Unknown)   SpO2 92%   BMI 38.28 kg/m   Wt Readings from Last 3 Encounters:  06/24/23 101.2 kg (223 lb)  05/11/23 104.6 kg (230 lb 9.6 oz)  05/04/23 99.8 kg (220 lb)   PHYSICAL EXAM: General:  Elderly woman No resp difficulty HEENT: normal Neck: supple. JVP hard to see suspect elevated Carotids 2+ bilat; no bruits. No lymphadenopathy or thryomegaly appreciated. Cor: PMI nondisplaced. Regular rate & rhythm. No rubs, gallops or murmurs. Lungs: reduced throughout Abdomen: obese soft, nontender, nondistended. No hepatosplenomegaly. No bruits or masses. Good bowel sounds. Extremities: no cyanosis, clubbing, rash, edema Neuro: alert & orientedx3, cranial nerves grossly intact. moves all 4 extremities w/o difficulty. Affect pleasant   NSR 81 No ST-T wave abnormalities. Personally reviewed  ASSESSMENT & PLAN:  1. Pulmonary HTN with RV failure/cor pulmonale - Echo (  12/22): EF 65-70% RV moderately dilated with moderate HK, flattened septum, moderate TR, RVSP est 110 mmHG   - Chest CT scan (2/22): No PE - RHC (12/22): RA 5 PA 79/18 (39) PCW 12  CO 6.4 L/min; CI PVR 4.2 - PFTs with significant restrictive/obstructive lung disease - ANA, ANCA, [place]-70, RF all negative. - Sleep study with mild OSA.  - Suspect mostly WHO  Group III. - Echo 11/24 EF 60-65% RV mod reduced RVSP 92 Personally reviewed - NYHA II-III - Volume status ok - Continue Lasix 80 mg bid. - Continue Farxiga 10 mg daily. - Consider weaning metoprolol - Off lisinopril with previous AKI and hypotension  - Echo shows persistent severe PH with RV strain. Will repeat RHC - I continue to feel that she is high risk for OSA and nocturnal hypoxia. Previous sleep study with just mild OSE. Check ONOX - Hall walk today sat >= 90% without O2 - Base on results of RHC may consider trial of selective pulmonary vasodialtors  2. CAD - CABG 2005 LIMA-> LAD - Cath (04/16/21): mLAD 75% LIMA to LAD is widely patent. pRCA 50 - No s/s anginan - Continue ASA/statin. - Management per Dr. Anne Fu.  3. Morbid obesity - Body mass index is 38.28 kg/m.. - Continue weight loss efforts with Ozempic per PCP  4. OSA - Mild on sleep study. Not on CPAP - Has follow up with Pulm - Check ONOX  5. HTN - BP controlled  I spent a total of 48 minutes today: 1) reviewing the patient's medical records including previous charts, labs and recent notes from other providers; 2) examining the patient and counseling them on their medical issues/explaining the plan of care; 3) adjusting meds as needed and 4) ordering lab work or other needed tests.    Arvilla Meres, MD  3:00 PM

## 2023-06-24 NOTE — H&P (View-Only) (Signed)
 ADVANCED HF CLINIC NOTE  PCP: Hoy Register, MD Primary Cardiologist: Donato Schultz, MD HF Cardiologist: Dr. Gala Romney  CC: HF follow up  HPI: Deborah Coffey is a 68 y.o. with CAD s/p CABG x1 (LIMA-LAD) 2005, tobacco use, chronic diastolic CHF, anemia, depression, HLD, HTN, DM, CKD stage 3b, prior TIAs, pulmonary HTN (felt due to underlying lung disease/smoking), and morbid obesity.    Echo12/22 EF 65-70% RV moderately dilated with moderate HK. Flattened septum Moderate TR RVSP est 110 mmHG    Chest CT scan 2/22. No PE   R/L cath on 04/16/21: - mLAD 75% LIMA to LAD is widely patent. pRCA 50% - EF 65% - RA 5 PA 79/18 (39) PCW 12  CO 6.4 L/min; CI PVR 4.2  Sleep study 06/2021 only c/w mild OSA AHI 5.5   Echo 11/24 EF 60-65%, IVC flattened in systole and diastole, c/w RV pressure and volume overload.  RV severely enlarged w/ severely reduced systolic function. The estimated RVSP is 92.5 mmHg (comparred to 111 mmHg previous study). RA severely dilated. Moderate TR    PFTs 12/24: FEV1 1.27 (50%) FVC 1.84 (55),ratio 77, DLCOcor 48. Moderately severe restriction with reversibility (12% change) and severe diffusion defect. She is being followed by [facility] (Dr. Tonia Brooms). He has also being following her for pulmonary nodules.   Echo 11/24 EF 60-65% RV mod reduced RVSP 92 Personally reviewed  Returns for HF follow-up. Says she feels pretty good but does have SOB with mild exertion. Able to do ADLs if she takes her time. Occasional presyncope. Mild ankle edema. On Ozempic but not losing much weight. Not on CPAP.   Cardiac Studies:  - Echo 11/24 EF 60-65%, IVC flattened in systole and diastole, c/w RV pressure and volume overload,  RV severely enlarged w/ severely reduced systolic function.   - PFTs 2/23 FEV1 1.27 (61%) FVC 1.63 (61%) DLCO 43%  - R/LHC (12/22): EF 65%, mLAD 75% LIMA to LAD is widely patent. pRCA 50; RA 5 PA 79/18 (39) PCW 12  CO 6.4 L/min; CI PVR 4.2  -  Echo (12/22): EF 65-70% RV moderately dilated with moderate HK. Flattened septum Moderate TR RVSP est    - Chest CT scan 2/22. No PE  Past Medical History:  Diagnosis Date   Allergy    Anemia    CAD in native artery 02/18/2004   Mid LAD lesoin just after Large D1 --> CABG x 1 LIMA-LAD   CAP (community acquired pneumonia) 08/07/2014   Chronic kidney disease, stage 3b (HCC)    Depression    High cholesterol    History of blood transfusion 04/20/2003   "related to OHS"    Hypertension    Lung nodules    Mild carotid artery disease (HCC)    Pulmonary hypertension (HCC)    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 competitive flow   TIA (transient ischemic attack)    Type II diabetes mellitus (HCC)    Current Outpatient Medications  Medication Sig Dispense Refill   acetaminophen (TYLENOL) 500 MG tablet Take 1,000 mg by mouth every 6 (six) hours as needed for headache (pain).      Alcohol Swabs (B-D SINGLE USE SWABS REGULAR) PADS USE AS DIRECTED 100 each 0   aspirin EC 81 MG tablet Take 81 mg by mouth daily.     atorvastatin (LIPITOR) 10 MG tablet Take 1 tablet (10 mg total) by mouth once daily. 90  tablet 1   Blood Glucose Monitoring Suppl (ONETOUCH VERIO) w/Device KIT Use as instructed to test blood glucose 3 times daily. 1 kit 0   budesonide-formoterol (SYMBICORT) 80-4.5 MCG/ACT inhaler Inhale 2 puffs into the lungs in the morning and at bedtime. 20.4 each 5   buPROPion (WELLBUTRIN SR) 150 MG 12 hr tablet Take 1 tablet (150 mg total) by mouth 2 (two) times daily. 60 tablet 5   Continuous Glucose Sensor (DEXCOM G6 SENSOR) MISC Use to check blood sugar three times daily. Dx. E11.59 3 each 3   cyanocobalamin (VITAMIN B12) 1000 MCG tablet Take 1,000 mcg by mouth daily.     dapagliflozin propanediol (FARXIGA) 10 MG TABS tablet Take 1 tablet (10 mg total) by mouth daily before breakfast. 90 tablet 1   diclofenac Sodium (VOLTAREN) 1 %  GEL Apply 2 g topically daily as needed (muscle pain). 150 g 1   furosemide (LASIX) 80 MG tablet Take 80 mg by mouth 2 (two) times daily.     gabapentin (NEURONTIN) 300 MG capsule Take 600 mg by mouth 2 (two) times daily.     glucose blood (ONETOUCH VERIO) test strip Use as instructed to test blood glucose 3 times daily. 100 each 3   Insulin Glargine (TOUJEO SOLOSTAR Rosebud) Inject 40 Units into the skin daily.     Insulin Pen Needle (DROPLET PEN NEEDLES) 31G X 8 MM MISC USE WITH TOUJEO SOLOSTAR PEN EVERY DAY 100 each 0   Lancets (ONETOUCH DELICA PLUS LANCET33G) MISC Use as instructed to test blood glucose 3 times daily. 100 each 3   metFORMIN (GLUCOPHAGE-XR) 500 MG 24 hr tablet Take 2 tablets (1,000 mg total) by mouth 2 (two) times daily. 360 tablet 1   metoprolol tartrate (LOPRESSOR) 50 MG tablet Take 1 tablet (50 mg total) by mouth 2 (two) times daily. 180 tablet 1   Multiple Vitamin (MULTIVITAMIN WITH MINERALS) TABS tablet Take 1 tablet by mouth daily.     nitroGLYCERIN (NITROSTAT) 0.4 MG SL tablet Place 1 tablet (0.4 mg total) under the tongue every 5 (five) minutes as needed for chest pain. 25 tablet 0   pantoprazole (PROTONIX) 40 MG tablet Take 1 tablet (40 mg total) by mouth 2 (two) times daily. 180 tablet 1   Semaglutide, 1 MG/DOSE, 4 MG/3ML SOPN Inject 1 mg as directed once a week. For 4 weeks then increase to 2 mg. 3 mL 0   VITAMIN D, CHOLECALCIFEROL, PO Take 1 tablet by mouth daily.     albuterol (VENTOLIN HFA) 108 (90 Base) MCG/ACT inhaler Inhale 2 puffs into the lungs every 6 (six) hours as needed for wheezing or shortness of breath. (Patient not taking: Reported on 05/04/2023) 8 g 6   clotrimazole (LOTRIMIN) 1 % cream Apply 1 Application topically 2 (two) times daily. (Patient not taking: Reported on 06/24/2023) 85 g 1   Semaglutide, 2 MG/DOSE, 8 MG/3ML SOPN Inject 2 mg as directed once a week. (Patient not taking: Reported on 06/24/2023) 3 mL 1   No current facility-administered medications  for this encounter.   Allergies  Allergen Reactions   Ciprofloxacin Rash   Social History   Socioeconomic History   Marital status: Divorced    Spouse name: Not on file   Number of children: Not on file   Years of education: 12   Highest education level: Some college, no degree  Occupational History   Occupation: Disabled  Tobacco Use   Smoking status: Former    Current packs/day: 0.25  Average packs/day: 0.3 packs/day for 42.0 years (10.5 ttl pk-yrs)    Types: Cigarettes   Smokeless tobacco: Never   Tobacco comments:    Smokes 1-3 cigars/ black cigarettes a week. Agreed to referral to [place] quit line. Referral faxed. Patient given cessation information  Vaping Use   Vaping status: Some Days   Substances: Flavoring  Substance and Sexual Activity   Alcohol use: Not Currently   Drug use: No    Types: Marijuana    Comment: smoked pot years ago but not currently   Sexual activity: Not Currently  Other Topics Concern   Not on file  Social History Narrative   Lives with daughter and 3 grandchildren   Social Drivers of Health   Financial Resource Strain: Low Risk  (08/19/2022)   Overall Financial Resource Strain (CARDIA)    Difficulty of Paying Living Expenses: Not hard at all  Food Insecurity: No Food Insecurity (08/19/2022)   Hunger Vital Sign    Worried About Running Out of Food in the Last Year: Never true    Ran Out of Food in the Last Year: Never true  Transportation Needs: No Transportation Needs (08/19/2022)   PRAPARE - Administrator, Civil Service (Medical): No    Lack of Transportation (Non-Medical): No  Physical Activity: Inactive (08/19/2022)   Exercise Vital Sign    Days of Exercise per Week: 0 days    Minutes of Exercise per Session: 0 min  Stress: No Stress Concern Present (08/19/2022)   [employer] of Occupational Health - Occupational Stress Questionnaire    Feeling of Stress : Not at all  Social Connections: Not on file  Intimate Partner  Violence: Not At Risk (02/03/2023)   Humiliation, Afraid, Rape, and Kick questionnaire    Fear of Current or Ex-Partner: No    Emotionally Abused: No    Physically Abused: No    Sexually Abused: No   Family History  Problem Relation Age of Onset   Heart disease Mother    Diabetes Mother    Cancer Mother        cervical   Diabetes Father    Diabetes Sister    Heart disease Sister    Lupus Sister    Heart failure Sister    Hepatitis C Sister    Hypertension Daughter    Diabetes Brother    Heart attack Brother    Heart failure Brother    Diabetes Brother    Colon cancer Neg Hx    Breast cancer Neg Hx    BP 124/80   Pulse 83   Wt 101.2 kg (223 lb)   LMP  (LMP Unknown)   SpO2 92%   BMI 38.28 kg/m   Wt Readings from Last 3 Encounters:  06/24/23 101.2 kg (223 lb)  05/11/23 104.6 kg (230 lb 9.6 oz)  05/04/23 99.8 kg (220 lb)   PHYSICAL EXAM: General:  Elderly woman No resp difficulty HEENT: normal Neck: supple. JVP hard to see suspect elevated Carotids 2+ bilat; no bruits. No lymphadenopathy or thryomegaly appreciated. Cor: PMI nondisplaced. Regular rate & rhythm. No rubs, gallops or murmurs. Lungs: reduced throughout Abdomen: obese soft, nontender, nondistended. No hepatosplenomegaly. No bruits or masses. Good bowel sounds. Extremities: no cyanosis, clubbing, rash, edema Neuro: alert & orientedx3, cranial nerves grossly intact. moves all 4 extremities w/o difficulty. Affect pleasant   NSR 81 No ST-T wave abnormalities. Personally reviewed  ASSESSMENT & PLAN:  1. Pulmonary HTN with RV failure/cor pulmonale - Echo (  12/22): EF 65-70% RV moderately dilated with moderate HK, flattened septum, moderate TR, RVSP est 110 mmHG   - Chest CT scan (2/22): No PE - RHC (12/22): RA 5 PA 79/18 (39) PCW 12  CO 6.4 L/min; CI PVR 4.2 - PFTs with significant restrictive/obstructive lung disease - ANA, ANCA, [place]-70, RF all negative. - Sleep study with mild OSA.  - Suspect mostly WHO  Group III. - Echo 11/24 EF 60-65% RV mod reduced RVSP 92 Personally reviewed - NYHA II-III - Volume status ok - Continue Lasix 80 mg bid. - Continue Farxiga 10 mg daily. - Consider weaning metoprolol - Off lisinopril with previous AKI and hypotension  - Echo shows persistent severe PH with RV strain. Will repeat RHC - I continue to feel that she is high risk for OSA and nocturnal hypoxia. Previous sleep study with just mild OSE. Check ONOX - Hall walk today sat >= 90% without O2 - Base on results of RHC may consider trial of selective pulmonary vasodialtors  2. CAD - CABG 2005 LIMA-> LAD - Cath (04/16/21): mLAD 75% LIMA to LAD is widely patent. pRCA 50 - No s/s anginan - Continue ASA/statin. - Management per Dr. Anne Fu.  3. Morbid obesity - Body mass index is 38.28 kg/m.. - Continue weight loss efforts with Ozempic per PCP  4. OSA - Mild on sleep study. Not on CPAP - Has follow up with Pulm - Check ONOX  5. HTN - BP controlled  I spent a total of 48 minutes today: 1) reviewing the patient's medical records including previous charts, labs and recent notes from other providers; 2) examining the patient and counseling them on their medical issues/explaining the plan of care; 3) adjusting meds as needed and 4) ordering lab work or other needed tests.    Arvilla Meres, MD  3:00 PM

## 2023-06-24 NOTE — Patient Instructions (Addendum)
 There has been no changes to your medications  Labs done today, your results will be available in MyChart, we will contact you for abnormal readings.  You will be called to have your overnight pulse oximetry test done   You are scheduled for a Cardiac Catheterization on Thursday, March 27 with Dr. Arvilla Meres.  1. Please arrive at the [facility] (Main Entrance A) at [facility]: [address] [place], [place] [address] at 7:00 AM (This time is 2 hour(s) before your procedure to ensure your preparation).   Free valet parking service is available. You will check in at ADMITTING. The support person will be asked to wait in the waiting room.  It is OK to have someone drop you off and come back when you are ready to be discharged.    Special note: Every effort is made to have your procedure done on time. Please understand that emergencies sometimes delay scheduled procedures.  2. Diet: Do not eat solid foods after midnight.  The patient may have clear liquids until 5am upon the day of the procedure.  3. Medication instructions in preparation for your procedure:   Contrast Allergy: No  HOLD LASIX AND FARXIGA THE MORNING OF YOUR PROCEDURE.  Take only 20 units of insulin the night before your procedure. Do not take any insulin on the day of the procedure.  Do not take Diabetes Med Glucophage (Metformin) on the day of the procedure and HOLD 48 HOURS AFTER THE PROCEDURE.  On the morning of your procedure, take  any morning medicines NOT listed above.  You may use sips of water.  5. Plan to go home the same day, you will only stay overnight if medically necessary. 6. Bring a current list of your medications and current insurance cards. 7. You MUST have a responsible person to drive you home. 8. Someone MUST be with you the first 24 hours after you arrive home or your discharge will be delayed. 9. Please wear clothes that are easy to get on and off and wear slip-on shoes.  Your  physician recommends that you schedule a follow-up appointment in: 4 MONTHS (July) ** PLEASE CALL THE OFFICE IN MAY TO ARRANGE YOUR FOLLOW UP APPOINTMENT.*8  If you have any questions or concerns before your next appointment please send [place] a message through [place] or call our office at [phone].    TO LEAVE A MESSAGE FOR THE NURSE SELECT OPTION 2, PLEASE LEAVE A MESSAGE INCLUDING: YOUR NAME DATE OF BIRTH CALL BACK NUMBER REASON FOR CALL**this is important as we prioritize the call backs  YOU WILL RECEIVE A CALL BACK THE SAME DAY AS LONG AS YOU CALL BEFORE 4:00 PM  At the [facility], you and your health needs are our priority. As part of our continuing mission to provide you with exceptional heart care, we have created designated Provider Care Teams. These Care Teams include your primary Cardiologist (physician) and Advanced Practice Providers (APPs- Physician Assistants and Nurse Practitioners) who all work together to provide you with the care you need, when you need it.   You may see any of the following providers on your designated Care Team at your next follow up: Dr Arvilla Meres Dr Marca Ancona Dr. Dorthula Nettles Dr. Clearnce Hasten Amy Filbert Schilder, NP Robbie Lis, [place] [facility] [place], [place] Brynda Peon, NP [place] Lee, NP Clarisa Kindred, NP Karle Plumber, PharmD Enos Fling, PharmD   Please be sure to bring in all your medications  bottles to every appointment.    Thank you for choosing [facility]

## 2023-07-05 ENCOUNTER — Telehealth (HOSPITAL_COMMUNITY): Payer: Self-pay

## 2023-07-14 ENCOUNTER — Encounter (HOSPITAL_COMMUNITY)
Admission: RE | Disposition: A | Discharge status: Home / Self Care | Payer: Self-pay | Source: Home / Self Care | Attending: Internal Medicine

## 2023-07-14 ENCOUNTER — Ambulatory Visit (HOSPITAL_COMMUNITY)
Admission: RE | Admit: 2023-07-14 | Discharge: 2023-07-14 | Disposition: A | Discharge status: Home / Self Care | Attending: Internal Medicine | Admitting: Internal Medicine

## 2023-07-14 ENCOUNTER — Other Ambulatory Visit (HOSPITAL_COMMUNITY): Payer: Self-pay

## 2023-07-14 ENCOUNTER — Encounter (HOSPITAL_COMMUNITY): Payer: Self-pay | Admitting: Internal Medicine

## 2023-07-14 ENCOUNTER — Other Ambulatory Visit: Payer: Self-pay

## 2023-07-14 ENCOUNTER — Telehealth (HOSPITAL_COMMUNITY): Payer: Self-pay | Admitting: Pharmacist

## 2023-07-14 DIAGNOSIS — I130 Hypertensive heart and chronic kidney disease with heart failure and stage 1 through stage 4 chronic kidney disease, or unspecified chronic kidney disease: Secondary | ICD-10-CM | POA: Diagnosis not present

## 2023-07-14 DIAGNOSIS — I2781 Cor pulmonale (chronic): Secondary | ICD-10-CM | POA: Insufficient documentation

## 2023-07-14 DIAGNOSIS — Z6838 Body mass index (BMI) 38.0-38.9, adult: Secondary | ICD-10-CM | POA: Diagnosis not present

## 2023-07-14 DIAGNOSIS — I5032 Chronic diastolic (congestive) heart failure: Secondary | ICD-10-CM | POA: Diagnosis not present

## 2023-07-14 DIAGNOSIS — N1832 Chronic kidney disease, stage 3b: Secondary | ICD-10-CM | POA: Diagnosis not present

## 2023-07-14 DIAGNOSIS — Z79899 Other long term (current) drug therapy: Secondary | ICD-10-CM | POA: Diagnosis not present

## 2023-07-14 DIAGNOSIS — I272 Pulmonary hypertension, unspecified: Secondary | ICD-10-CM

## 2023-07-14 DIAGNOSIS — J449 Chronic obstructive pulmonary disease, unspecified: Secondary | ICD-10-CM | POA: Diagnosis not present

## 2023-07-14 DIAGNOSIS — E1122 Type 2 diabetes mellitus with diabetic chronic kidney disease: Secondary | ICD-10-CM | POA: Diagnosis not present

## 2023-07-14 DIAGNOSIS — Z7985 Long-term (current) use of injectable non-insulin antidiabetic drugs: Secondary | ICD-10-CM | POA: Insufficient documentation

## 2023-07-14 DIAGNOSIS — I2729 Other secondary pulmonary hypertension: Secondary | ICD-10-CM | POA: Insufficient documentation

## 2023-07-14 DIAGNOSIS — Z951 Presence of aortocoronary bypass graft: Secondary | ICD-10-CM | POA: Insufficient documentation

## 2023-07-14 DIAGNOSIS — Z7984 Long term (current) use of oral hypoglycemic drugs: Secondary | ICD-10-CM | POA: Diagnosis not present

## 2023-07-14 DIAGNOSIS — Z794 Long term (current) use of insulin: Secondary | ICD-10-CM | POA: Diagnosis not present

## 2023-07-14 DIAGNOSIS — Z8249 Family history of ischemic heart disease and other diseases of the circulatory system: Secondary | ICD-10-CM | POA: Diagnosis not present

## 2023-07-14 DIAGNOSIS — G4733 Obstructive sleep apnea (adult) (pediatric): Secondary | ICD-10-CM | POA: Diagnosis not present

## 2023-07-14 DIAGNOSIS — F17290 Nicotine dependence, other tobacco product, uncomplicated: Secondary | ICD-10-CM | POA: Insufficient documentation

## 2023-07-14 DIAGNOSIS — I2510 Atherosclerotic heart disease of native coronary artery without angina pectoris: Secondary | ICD-10-CM | POA: Insufficient documentation

## 2023-07-14 HISTORY — PX: RIGHT HEART CATH: CATH118263

## 2023-07-14 LAB — POCT I-STAT EG7
Acid-Base Excess: 1 mmol/L (ref 0.0–2.0)
Acid-base deficit: 1 mmol/L (ref 0.0–2.0)
Bicarbonate: 24.2 mmol/L (ref 20.0–28.0)
Bicarbonate: 26.3 mmol/L (ref 20.0–28.0)
Calcium, Ion: 1.06 mmol/L — ABNORMAL LOW (ref 1.15–1.40)
Calcium, Ion: 1.22 mmol/L (ref 1.15–1.40)
HCT: 36 % (ref 36.0–46.0)
HCT: 39 % (ref 36.0–46.0)
Hemoglobin: 12.2 g/dL (ref 12.0–15.0)
Hemoglobin: 13.3 g/dL (ref 12.0–15.0)
O2 Saturation: 57 %
O2 Saturation: 58 %
Potassium: 3.3 mmol/L — ABNORMAL LOW (ref 3.5–5.1)
Potassium: 3.7 mmol/L (ref 3.5–5.1)
Sodium: 143 mmol/L (ref 135–145)
Sodium: 146 mmol/L — ABNORMAL HIGH (ref 135–145)
TCO2: 25 mmol/L (ref 22–32)
TCO2: 28 mmol/L (ref 22–32)
pCO2, Ven: 41.4 mmHg — ABNORMAL LOW (ref 44–60)
pCO2, Ven: 44 mmHg (ref 44–60)
pH, Ven: 7.374 (ref 7.25–7.43)
pH, Ven: 7.383 (ref 7.25–7.43)
pO2, Ven: 31 mmHg — CL (ref 32–45)
pO2, Ven: 31 mmHg — CL (ref 32–45)

## 2023-07-14 LAB — GLUCOSE, CAPILLARY: Glucose-Capillary: 99 mg/dL (ref 70–99)

## 2023-07-14 SURGERY — RIGHT HEART CATH
Anesthesia: LOCAL

## 2023-07-14 MED ORDER — LIDOCAINE HCL (PF) 1 % IJ SOLN
INTRAMUSCULAR | Status: AC
  Filled 2023-07-14: qty 30

## 2023-07-14 MED ORDER — ACETAMINOPHEN 325 MG PO TABS: 650.0000 mg | ORAL_TABLET | ORAL | Status: DC | PRN

## 2023-07-14 MED ORDER — SODIUM CHLORIDE 0.9 % IV SOLN: 250.0000 mL | INTRAVENOUS | Status: DC | PRN

## 2023-07-14 MED ORDER — SODIUM CHLORIDE 0.9% FLUSH: 3.0000 mL | INTRAVENOUS | Status: DC | PRN

## 2023-07-14 MED ORDER — LABETALOL HCL 5 MG/ML IV SOLN: 10.0000 mg | INTRAVENOUS | Status: DC | PRN

## 2023-07-14 MED ORDER — HYDRALAZINE HCL 20 MG/ML IJ SOLN: 10.0000 mg | INTRAMUSCULAR | Status: DC | PRN

## 2023-07-14 MED ORDER — LIDOCAINE HCL (PF) 1 % IJ SOLN
INTRAMUSCULAR | Status: DC | PRN
  Administered 2023-07-14: 2 mL

## 2023-07-14 MED ORDER — SODIUM CHLORIDE 0.9 % IV SOLN
INTRAVENOUS | Status: DC
Start: 2023-07-14 — End: 2023-07-14

## 2023-07-14 MED ORDER — SILDENAFIL CITRATE 20 MG PO TABS
20.0000 mg | ORAL_TABLET | Freq: Three times a day (TID) | ORAL | 3 refills | Status: DC
Start: 2023-07-14 — End: 2023-12-11

## 2023-07-14 MED ORDER — ONDANSETRON HCL 4 MG/2ML IJ SOLN: 4.0000 mg | Freq: Four times a day (QID) | INTRAMUSCULAR | Status: DC | PRN

## 2023-07-14 MED ORDER — HEPARIN (PORCINE) IN NACL 1000-0.9 UT/500ML-% IV SOLN
INTRAVENOUS | Status: DC | PRN
  Administered 2023-07-14: 500 mL

## 2023-07-14 MED ORDER — SODIUM CHLORIDE 0.9% FLUSH: 3.0000 mL | Freq: Two times a day (BID) | INTRAVENOUS | Status: DC

## 2023-07-14 SURGICAL SUPPLY — 6 items
CATH SWAN GANZ 7F STRAIGHT (CATHETERS) IMPLANT
GLIDESHEATH SLENDER 7FR .021G (SHEATH) IMPLANT
GUIDEWIRE .025 260CM (WIRE) IMPLANT
PACK CARDIAC CATHETERIZATION (CUSTOM PROCEDURE TRAY) ×2 IMPLANT
TRANSDUCER W/STOPCOCK (MISCELLANEOUS) IMPLANT
TUBING ART PRESS 72 MALE/FEM (TUBING) IMPLANT

## 2023-07-14 NOTE — Telephone Encounter (Signed)
 Patient Advocate Encounter   Received notification from [facility] that prior authorization for sildenafil is required.   PA submitted on CoverMyMeds Key [id number] Status is pending   Will continue to follow.   Karle Plumber, PharmD, BCPS, BCCP, CPP Heart Failure Clinic Pharmacist [phone]

## 2023-07-14 NOTE — Interval H&P Note (Signed)
 History and Physical Interval Note:  07/14/2023 10:43 AM  Deborah Coffey  has presented today for surgery, with the diagnosis of pulmonary hypertension, heart failure.  The various methods of treatment have been discussed with the patient and family. After consideration of risks, benefits and other options for treatment, the patient has consented to  Procedure(s): RIGHT HEART CATH (N/A) as a surgical intervention.  The patient's history has been reviewed, patient examined, no change in status, stable for surgery.  I have reviewed the patient's chart and labs.  Questions were answered to the patient's satisfaction.     Jaylin Roundy

## 2023-07-14 NOTE — Telephone Encounter (Signed)
 Advanced Heart Failure Patient Advocate Encounter  Prior Authorization for sildenafil has been approved.     Effective dates: 04/20/23 through 04/18/24  Karle Plumber, PharmD, BCPS, BCCP, CPP Heart Failure Clinic Pharmacist [phone]

## 2023-07-14 NOTE — Discharge Instructions (Signed)

## 2023-07-21 DIAGNOSIS — Z961 Presence of intraocular lens: Secondary | ICD-10-CM | POA: Diagnosis not present

## 2023-07-21 DIAGNOSIS — H25812 Combined forms of age-related cataract, left eye: Secondary | ICD-10-CM | POA: Diagnosis not present

## 2023-07-21 DIAGNOSIS — H40033 Anatomical narrow angle, bilateral: Secondary | ICD-10-CM | POA: Diagnosis not present

## 2023-07-21 DIAGNOSIS — H02206 Unspecified lagophthalmos left eye, unspecified eyelid: Secondary | ICD-10-CM | POA: Diagnosis not present

## 2023-07-21 DIAGNOSIS — E113593 Type 2 diabetes mellitus with proliferative diabetic retinopathy without macular edema, bilateral: Secondary | ICD-10-CM | POA: Diagnosis not present

## 2023-07-21 DIAGNOSIS — Z794 Long term (current) use of insulin: Secondary | ICD-10-CM | POA: Diagnosis not present

## 2023-07-25 DIAGNOSIS — H3342 Traction detachment of retina, left eye: Secondary | ICD-10-CM | POA: Diagnosis not present

## 2023-07-25 DIAGNOSIS — H35043 Retinal micro-aneurysms, unspecified, bilateral: Secondary | ICD-10-CM | POA: Diagnosis not present

## 2023-07-25 DIAGNOSIS — H4313 Vitreous hemorrhage, bilateral: Secondary | ICD-10-CM | POA: Diagnosis not present

## 2023-07-25 DIAGNOSIS — H2513 Age-related nuclear cataract, bilateral: Secondary | ICD-10-CM | POA: Diagnosis not present

## 2023-07-25 DIAGNOSIS — E113513 Type 2 diabetes mellitus with proliferative diabetic retinopathy with macular edema, bilateral: Secondary | ICD-10-CM | POA: Diagnosis not present

## 2023-07-25 DIAGNOSIS — E113512 Type 2 diabetes mellitus with proliferative diabetic retinopathy with macular edema, left eye: Secondary | ICD-10-CM | POA: Diagnosis not present

## 2023-08-08 NOTE — Anesthesia Preprocedure Evaluation (Signed)
 Anesthesia Evaluation    Airway        Dental   Pulmonary former smoker          Cardiovascular hypertension,      Neuro/Psych    GI/Hepatic   Endo/Other  diabetes    Renal/GU      Musculoskeletal   Abdominal   Peds  Hematology   Anesthesia Other Findings   Reproductive/Obstetrics                              Anesthesia Physical Anesthesia Plan  ASA:   Anesthesia Plan:    Post-op Pain Management:    Induction:   PONV Risk Score and Plan:   Airway Management Planned:   Additional Equipment:   Intra-op Plan:   Post-operative Plan:   Informed Consent:   Plan Discussed with:   Anesthesia Plan Comments: (PAT note by Rudy Costain, PA-C: 68 y.o. year old female with a history of CKD3b, prior TIAs, IDDM2 (A1c 6.4 on 05/11/23), Diabetic neuropathy, Hypertension, CAD (s/p CABG LIMA-LAD in 2005, patent by cath 03/2021), pulmonary hypertension with RV strain (Echo 11/24 EF 60-65%, IVC flattened in systole and diastole, c/w RV pressure and volume overload.  RV severely enlarged w/ severely reduced systolic function. The estimated RVSP is 92.5 mmHg (comparred to 111 mmHg previous study). RA severely dilated. Moderate TR).   PFTs 12/24: FEV1 1.27 (50%) FVC 1.84 (55),ratio 77, DLCOcor 48. Moderately severe restriction with reversibility (12% change) and severe diffusion defect.  Last seen in the advanced HF clinic by Dr. Julane Ny on 06/24/23. NYHA II-III at that time, volume status ok. Recommended repeat RHC due to persistent severe PH with RV strain on echo. Also discussed concern for untreated OSA/nocturnal hypoxia despite prior sleep study 05/2021 showing only mild OSA - recommended ONOX study.  Subsequent RHC on 07/14/22 showed severe PAH with cor pulmonale - she was started on sildenafil  20mg  tid.   Pt will need DOS labs and eval.   EKG 06/24/23: NSR. Rate 81.   RHC 07/14/23: Findings:   RA =  4 RV = 77/6 PA =  78/19 (42) PCW = 6 Fick cardiac output/index = 3.9/1.9 Thermo CO/CI = 3.6/1.7 PVR = 9.3 (Fick) 10.0 (TD) Ao sat = 96% PA sat = 57%, 58% PAPi = 14.8   Assessment: 1. Severe PAH with cor pulmonale - suspect Group I and possibly Group 3   Plan/Discussion:   Start sildenafil  20 tid.   TTE 02/25/23:  1. Left ventricular ejection fraction, by estimation, is 60 to 65%. The  left ventricle has normal function. The left ventricle has no regional  wall motion abnormalities. Left ventricular diastolic parameters are  indeterminate. There is the  interventricular septum is flattened in systole and diastole, consistent  with right ventricular pressure and volume overload.   2. Right ventricular systolic function is moderately reduced. The right  ventricular size is severely enlarged. There is severely elevated  pulmonary artery systolic pressure. The estimated right ventricular  systolic pressure is 92.5 mmHg.   3. Right atrial size was severely dilated.   4. The mitral valve is normal in structure. No evidence of mitral valve  regurgitation. No evidence of mitral stenosis.   5. Tricuspid valve regurgitation is moderate.   6. The aortic valve is normal in structure. Aortic valve regurgitation is  not visualized. No aortic stenosis is present.   7. Pulmonic valve regurgitation is moderate.   8. The  inferior vena cava is normal in size with greater than 50%  respiratory variability, suggesting right atrial pressure of 3 mmHg.   )         Anesthesia Quick Evaluation

## 2023-08-08 NOTE — Progress Notes (Signed)
 Anesthesia Chart Review: Same day workup  68 y.o. year old female with a history of CKD3b, prior TIAs, IDDM2 (A1c 6.4 on 05/11/23), Diabetic neuropathy, Hypertension, CAD (s/p CABG LIMA-LAD in 2005, patent by cath 03/2021), pulmonary hypertension with RV strain (Echo 11/24 EF 60-65%, IVC flattened in systole and diastole, c/w RV pressure and volume overload.  RV severely enlarged w/ severely reduced systolic function. The estimated RVSP is 92.5 mmHg (comparred to 111 mmHg previous study). RA severely dilated. Moderate TR).   PFTs 12/24: FEV1 1.27 (50%) FVC 1.84 (55),ratio 77, DLCOcor 48. Moderately severe restriction with reversibility (12% change) and severe diffusion defect.  Last seen in the advanced HF clinic by Dr. Julane Ny on 06/24/23. NYHA II-III at that time, volume status ok. Recommended repeat RHC due to persistent severe PH with RV strain on echo. Also discussed concern for untreated OSA/nocturnal hypoxia despite prior sleep study 05/2021 showing only mild OSA - recommended ONOX study.  Subsequent RHC on 07/14/22 showed severe PAH with cor pulmonale - she was started on sildenafil  20mg  tid.   Pt will need DOS labs and eval.   EKG 06/24/23: NSR. Rate 81.   RHC 07/14/23: Findings:   RA = 4 RV = 77/6 PA =  78/19 (42) PCW = 6 Fick cardiac output/index = 3.9/1.9 Thermo CO/CI = 3.6/1.7 PVR = 9.3 (Fick) 10.0 (TD) Ao sat = 96% PA sat = 57%, 58% PAPi = 14.8   Assessment: 1. Severe PAH with cor pulmonale - suspect Group I and possibly Group 3   Plan/Discussion:   Start sildenafil  20 tid.   TTE 02/25/23:  1. Left ventricular ejection fraction, by estimation, is 60 to 65%. The  left ventricle has normal function. The left ventricle has no regional  wall motion abnormalities. Left ventricular diastolic parameters are  indeterminate. There is the  interventricular septum is flattened in systole and diastole, consistent  with right ventricular pressure and volume overload.   2. Right  ventricular systolic function is moderately reduced. The right  ventricular size is severely enlarged. There is severely elevated  pulmonary artery systolic pressure. The estimated right ventricular  systolic pressure is 92.5 mmHg.   3. Right atrial size was severely dilated.   4. The mitral valve is normal in structure. No evidence of mitral valve  regurgitation. No evidence of mitral stenosis.   5. Tricuspid valve regurgitation is moderate.   6. The aortic valve is normal in structure. Aortic valve regurgitation is  not visualized. No aortic stenosis is present.   7. Pulmonic valve regurgitation is moderate.   8. The inferior vena cava is normal in size with greater than 50%  respiratory variability, suggesting right atrial pressure of 3 mmHg.     Edilia Gordon [facility] Short Stay Center/Anesthesiology Phone [phone] 08/08/2023 11:24 AM

## 2023-08-09 ENCOUNTER — Encounter (HOSPITAL_COMMUNITY): Admission: RE | Payer: Self-pay | Source: Home / Self Care

## 2023-08-09 ENCOUNTER — Ambulatory Visit (HOSPITAL_COMMUNITY): Admission: RE | Admit: 2023-08-09 | Source: Home / Self Care | Admitting: Ophthalmology

## 2023-08-09 ENCOUNTER — Encounter (HOSPITAL_COMMUNITY): Payer: Self-pay | Admitting: Physician Assistant

## 2023-08-09 SURGERY — PARS PLANA VITRECTOMY WITH 25 GAUGE WITH REMOVAL OF INTERNAL LIMITING MEMBRANE OF RETINA WITH INTRAOCULAR TAMPONADE
Anesthesia: Monitor Anesthesia Care | Laterality: Right

## 2023-08-10 ENCOUNTER — Encounter: Payer: Self-pay | Admitting: Family Medicine

## 2023-08-10 ENCOUNTER — Ambulatory Visit: Payer: Medicare PPO | Attending: Family Medicine | Admitting: Family Medicine

## 2023-08-10 VITALS — BP 98/65 | HR 80 | Ht 64.5 in | Wt 223.6 lb

## 2023-08-10 DIAGNOSIS — E785 Hyperlipidemia, unspecified: Secondary | ICD-10-CM

## 2023-08-10 DIAGNOSIS — E1122 Type 2 diabetes mellitus with diabetic chronic kidney disease: Secondary | ICD-10-CM | POA: Diagnosis not present

## 2023-08-10 DIAGNOSIS — I5032 Chronic diastolic (congestive) heart failure: Secondary | ICD-10-CM

## 2023-08-10 DIAGNOSIS — Z7984 Long term (current) use of oral hypoglycemic drugs: Secondary | ICD-10-CM | POA: Diagnosis not present

## 2023-08-10 DIAGNOSIS — Z7985 Long-term (current) use of injectable non-insulin antidiabetic drugs: Secondary | ICD-10-CM

## 2023-08-10 DIAGNOSIS — E1159 Type 2 diabetes mellitus with other circulatory complications: Secondary | ICD-10-CM

## 2023-08-10 DIAGNOSIS — E1169 Type 2 diabetes mellitus with other specified complication: Secondary | ICD-10-CM

## 2023-08-10 DIAGNOSIS — I272 Pulmonary hypertension, unspecified: Secondary | ICD-10-CM

## 2023-08-10 DIAGNOSIS — Z794 Long term (current) use of insulin: Secondary | ICD-10-CM

## 2023-08-10 DIAGNOSIS — I129 Hypertensive chronic kidney disease with stage 1 through stage 4 chronic kidney disease, or unspecified chronic kidney disease: Secondary | ICD-10-CM

## 2023-08-10 LAB — POCT GLYCOSYLATED HEMOGLOBIN (HGB A1C): HbA1c, POC (controlled diabetic range): 5.7 % (ref 0.0–7.0)

## 2023-08-10 MED ORDER — DEXCOM G6 SENSOR MISC: 11 refills | Status: DC

## 2023-08-10 MED ORDER — SEMAGLUTIDE (2 MG/DOSE) 8 MG/3ML ~~LOC~~ SOPN: 2.0000 mg | PEN_INJECTOR | SUBCUTANEOUS | 1 refills | Status: DC

## 2023-08-10 NOTE — Patient Instructions (Signed)
 VISIT SUMMARY:  You came in today due to a swollen foot, which has led to the postponement of your scheduled eye surgery. We also discussed your recent cardiac catheterization and your diabetes management.  YOUR PLAN:  -CONGESTIVE HEART FAILURE: Congestive heart failure means your heart is not pumping blood as well as it should. Your recent cardiac catheterization showed high pressure in your pulmonary artery, which needs to be managed carefully. Please make sure to attend your cardiology appointment on Monday to discuss the results and your management plan with the cardiology team.  -PULMONARY HYPERTENSION: Pulmonary hypertension is high blood pressure in the arteries of your lungs. You have been prescribed a new medication for this condition. Please continue taking this medication and discuss your management plan with the cardiology team at your upcoming appointment.  -TYPE 2 DIABETES MELLITUS: Type 2 diabetes means your body does not use insulin  properly. Your A1C level is 5.7%, which indicates excellent control. Continue taking Ozempic  2 mg weekly and metformin  500 mg twice daily. We will send a prescription for your Dexcom sensors to [facility] and for Ozempic  to the pharmacy. Please try to increase your physical activity.  INSTRUCTIONS:  Please attend your cardiology appointment on Monday to discuss the results of your cardiac catheterization and your management plan. Continue taking your medications as prescribed. We will send the necessary prescriptions for your Dexcom sensors and Ozempic . Try to increase your physical activity by attending [employer] or engaging in other forms of exercise.

## 2023-08-10 NOTE — Progress Notes (Signed)
 Subjective:  Patient ID: Deborah Coffey, female    DOB: February 19, 1956  Age: 68 y.o. MRN: [id number]  CC: Medical Management of Chronic Issues     Discussed the use of AI scribe software for clinical note transcription with the patient, who gave verbal consent to proceed.  History of Present Illness The patient, with a history of Type 2 DM (A1c 6.4), Diabetic neuropathy, Hypertension, CAD (s/p CABG), pulmonary hypertension with RV strain (EF 60 to 65%, enlarged right RV, severely elevated PA pressure, severely dilated RA from echo of 02/2023) ,CKD presents for follow-up visit.  She was previously scheduled for vitrectomy.  The surgery was to address a retinal issue, but due to the patient's heart condition, the anesthesiologist was concerned about applications in an outpatient setting.  The surgery was rescheduled to be performed at a hospital.  The patient also underwent a cardiac catheterization last month due to pulmonary hypertension which revealed severe PAH with cor pulmonale.  She was placed on Revatio  by cardiology and has an upcoming appointment next Monday. Endorses adherence with her cardiac medications, has statin and antihypertensive.  Regarding her diabetes, the patient's A1C is 5.7, indicating excellent control. She is on Ozempic  2mg , metformin  500mg  twice daily, and insulin  40 units at night. The patient reports slow weight loss and decreased appetite, but admits to a lack of physical activity. She is signed up with [employer] but has not been attending. The patient also uses a Dexcom device for diabetes management and needs a prescription for the sensors.  Her kidney function has improved, with a decrease from 1.91 to 1.57.    Past Medical History:  Diagnosis Date   Allergy    Anemia    CAD in native artery 02/18/2004   Mid LAD lesoin just after Large D1 --> CABG x 1 LIMA-LAD   CAP (community acquired pneumonia) 08/07/2014   Chronic kidney disease, stage 3b  (HCC)    Depression    High cholesterol    History of blood transfusion 04/20/2003   "related to OHS"    Hypertension    Lung nodules    Mild carotid artery disease (HCC)    Pulmonary hypertension (HCC)    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 competitive flow   TIA (transient ischemic attack)    Type II diabetes mellitus (HCC)     Past Surgical History:  Procedure Laterality Date   CARDIAC CATHETERIZATION  2010   Previous LAD 95% lesion - now ~30-40%; patent LIMA with competitive flow   CESAREAN SECTION  1977; 1989   CHOLECYSTECTOMY  ~ 2012   CORONARY ARTERY BYPASS GRAFT  02/2004   "CABG X1" (03/22/2013); LIMA-LAD   RIGHT HEART CATH N/A 07/14/2023   Procedure: RIGHT HEART CATH;  Surgeon: Mardell Shade, MD;  Location: [facility] INVASIVE CV LAB;  Service: Cardiovascular;  Laterality: N/A;   RIGHT/LEFT HEART CATH AND CORONARY/GRAFT ANGIOGRAPHY N/A 04/16/2021   Procedure: RIGHT/LEFT HEART CATH AND CORONARY/GRAFT ANGIOGRAPHY;  Surgeon: Lucendia Rusk, MD;  Location: [facility] INVASIVE CV LAB;  Service: Cardiovascular;  Laterality: N/A;   TUBAL LIGATION  1989    Family History  Problem Relation Age of Onset   Heart disease Mother    Diabetes Mother    Cancer Mother        cervical   Diabetes Father    Diabetes Sister    Heart disease Sister    Lupus Sister  Heart failure Sister    Hepatitis C Sister    Hypertension Daughter    Diabetes Brother    Heart attack Brother    Heart failure Brother    Diabetes Brother    Colon cancer Neg Hx    Breast cancer Neg Hx     Social History   Socioeconomic History   Marital status: Divorced    Spouse name: Not on file   Number of children: Not on file   Years of education: 12   Highest education level: Some college, no degree  Occupational History   Occupation: Disabled  Tobacco Use   Smoking status: Former    Current packs/day: 0.25    Average packs/day: 0.3 packs/day for  42.0 years (10.5 ttl pk-yrs)    Types: Cigarettes   Smokeless tobacco: Never   Tobacco comments:    Smokes 1-3 cigars/ black cigarettes a week. Agreed to referral to [place] quit line. Referral faxed. Patient given cessation information  Vaping Use   Vaping status: Some Days   Substances: Flavoring  Substance and Sexual Activity   Alcohol use: Not Currently   Drug use: No    Types: Marijuana    Comment: smoked pot years ago but not currently   Sexual activity: Not Currently  Other Topics Concern   Not on file  Social History Narrative   Lives with daughter and 3 grandchildren   Social Drivers of Health   Financial Resource Strain: Low Risk  (08/19/2022)   Overall Financial Resource Strain (CARDIA)    Difficulty of Paying Living Expenses: Not hard at all  Food Insecurity: No Food Insecurity (08/19/2022)   Hunger Vital Sign    Worried About Running Out of Food in the Last Year: Never true    Ran Out of Food in the Last Year: Never true  Transportation Needs: No Transportation Needs (08/19/2022)   PRAPARE - Administrator, Civil Service (Medical): No    Lack of Transportation (Non-Medical): No  Physical Activity: Inactive (08/19/2022)   Exercise Vital Sign    Days of Exercise per Week: 0 days    Minutes of Exercise per Session: 0 min  Stress: No Stress Concern Present (08/19/2022)   [employer] of Occupational Health - Occupational Stress Questionnaire    Feeling of Stress : Not at all  Social Connections: Not on file    Allergies  Allergen Reactions   Ciprofloxacin Rash    Outpatient Medications Prior to Visit  Medication Sig Dispense Refill   acetaminophen  (TYLENOL ) 500 MG tablet Take 1,000 mg by mouth every 6 (six) hours as needed for headache or moderate pain (pain score 4-6).     Alcohol Swabs (B-D SINGLE USE SWABS REGULAR) PADS USE AS DIRECTED 100 each 0   aspirin  EC 81 MG tablet Take 81 mg by mouth daily.     atorvastatin  (LIPITOR) 10 MG tablet Take 1 tablet  (10 mg total) by mouth once daily. (Patient taking differently: Take 10 mg by mouth at bedtime.) 90 tablet 1   Blood Glucose Monitoring Suppl (ONETOUCH VERIO) w/Device KIT Use as instructed to test blood glucose 3 times daily. 1 kit 0   cyanocobalamin (VITAMIN B12) 1000 MCG tablet Take 1,000 mcg by mouth daily.     dapagliflozin  propanediol (FARXIGA ) 10 MG TABS tablet Take 1 tablet (10 mg total) by mouth daily before breakfast. 90 tablet 1   diclofenac  Sodium (VOLTAREN ) 1 % GEL Apply 2 g topically daily as needed (muscle pain). 150  g 1   furosemide  (LASIX ) 40 MG tablet Take 80 mg by mouth 2 (two) times daily.     gabapentin  (NEURONTIN ) 300 MG capsule Take 600 mg by mouth 2 (two) times daily.     glucose blood (ONETOUCH VERIO) test strip Use as instructed to test blood glucose 3 times daily. 100 each 3   insulin  glargine, 1 Unit Dial , (TOUJEO ) 300 UNIT/ML Solostar Pen Inject 40 Units into the skin daily.     Insulin  Pen Needle (DROPLET PEN NEEDLES) 31G X 8 MM MISC USE WITH TOUJEO  SOLOSTAR PEN EVERY DAY 100 each 0   Lancets (ONETOUCH DELICA PLUS LANCET33G) MISC Use as instructed to test blood glucose 3 times daily. 100 each 3   metFORMIN  (GLUCOPHAGE -XR) 500 MG 24 hr tablet Take 2 tablets (1,000 mg total) by mouth 2 (two) times daily. (Patient taking differently: Take 500 mg by mouth 2 (two) times daily.) 360 tablet 1   metoprolol  tartrate (LOPRESSOR ) 50 MG tablet Take 1 tablet (50 mg total) by mouth 2 (two) times daily. 180 tablet 1   Multiple Vitamin (MULTIVITAMIN WITH MINERALS) TABS tablet Take 1 tablet by mouth daily.     nitroGLYCERIN  (NITROSTAT ) 0.4 MG SL tablet Place 1 tablet (0.4 mg total) under the tongue every 5 (five) minutes as needed for chest pain. 25 tablet 0   pantoprazole  (PROTONIX ) 40 MG tablet Take 1 tablet (40 mg total) by mouth 2 (two) times daily. 180 tablet 1   Vitamin D , Cholecalciferol, 25 MCG (1000 UT) TABS Take 1,000 mcg by mouth daily.     Continuous Glucose Sensor (DEXCOM  G6 SENSOR) MISC Use to check blood sugar three times daily. Dx. E11.59 3 each 3   Semaglutide , 2 MG/DOSE, 8 MG/3ML SOPN Inject 2 mg as directed once a week. 3 mL 1   sildenafil  (REVATIO ) 20 MG tablet Take 1 tablet (20 mg total) by mouth 3 (three) times daily. (Patient not taking: Reported on 08/10/2023) 270 tablet 3   No facility-administered medications prior to visit.     ROS Review of Systems  Constitutional:  Negative for activity change and appetite change.  HENT:  Negative for sinus pressure and sore throat.   Respiratory:  Negative for chest tightness, shortness of breath and wheezing.   Cardiovascular:  Negative for chest pain and palpitations.  Gastrointestinal:  Negative for abdominal distention, abdominal pain and constipation.  Genitourinary: Negative.   Musculoskeletal: Negative.   Psychiatric/Behavioral:  Negative for behavioral problems and dysphoric mood.     Objective:  BP 98/65   Pulse 80   Ht 5' 4.5" (1.638 m)   Wt 223 lb 9.6 oz (101.4 kg)   LMP  (LMP Unknown)   SpO2 94%   BMI 37.79 kg/m      08/10/2023    1:42 PM 07/14/2023   12:00 PM 07/14/2023   11:45 AM  BP/Weight  Systolic BP 98 113 92  Diastolic BP 65 68 65  Wt. (Lbs) 223.6    BMI 37.79 kg/m2      Wt Readings from Last 3 Encounters:  08/10/23 223 lb 9.6 oz (101.4 kg)  07/14/23 220 lb (99.8 kg)  06/24/23 223 lb (101.2 kg)      Physical Exam Constitutional:      Appearance: She is well-developed.  Cardiovascular:     Rate and Rhythm: Normal rate.     Heart sounds: Normal heart sounds. No murmur heard. Pulmonary:     Effort: Pulmonary effort is normal.  Breath sounds: Normal breath sounds. No wheezing or rales.  Chest:     Chest wall: No tenderness.  Abdominal:     General: Bowel sounds are normal. There is no distension.     Palpations: Abdomen is soft. There is no mass.     Tenderness: There is no abdominal tenderness.  Musculoskeletal:        General: Normal range of motion.      Right lower leg: No edema.     Left lower leg: No edema.  Neurological:     Mental Status: She is alert and oriented to person, place, and time.  Psychiatric:        Mood and Affect: Mood normal.        Latest Ref Rng & Units 07/14/2023   10:58 AM 06/24/2023   11:50 AM 05/30/2023    1:51 PM  CMP  Glucose 70 - 99 mg/dL  67  82   BUN 8 - 23 mg/dL  26  28   Creatinine 1.61 - 1.00 mg/dL  0.96  0.45   Sodium 409 - 145 mmol/L 135 - 145 mmol/L 143    146  139  139   Potassium 3.5 - 5.1 mmol/L 3.5 - 5.1 mmol/L 3.7    3.3  4.1  4.1   Chloride 98 - 111 mmol/L  103  101   CO2 22 - 32 mmol/L  24  22   Calcium  8.9 - 10.3 mg/dL  9.7  81.1     Lipid Panel     Component Value Date/Time   CHOL 161 01/21/2021 1618   TRIG 104 01/21/2021 1618   HDL 72 01/21/2021 1618   CHOLHDL 2.2 01/21/2021 1618   CHOLHDL 2.2 09/02/2016 0533   VLDL 13 09/02/2016 0533   LDLCALC 70 01/21/2021 1618    CBC    Component Value Date/Time   WBC 6.7 06/24/2023 1150   RBC 4.87 06/24/2023 1150   HGB 13.3 07/14/2023 1058   HGB 12.2 07/14/2023 1058   HGB 12.1 04/10/2021 1248   HCT 39.0 07/14/2023 1058   HCT 36.0 07/14/2023 1058   HCT 37.0 04/10/2021 1248   PLT 261 06/24/2023 1150   PLT 261 04/10/2021 1248   MCV 88.9 06/24/2023 1150   MCV 83 04/10/2021 1248   MCH 28.1 06/24/2023 1150   MCHC 31.6 06/24/2023 1150   RDW 16.8 (H) 06/24/2023 1150   RDW 13.7 04/10/2021 1248   LYMPHSABS 1.5 12/18/2021 1230   LYMPHSABS 1.9 01/21/2021 1618   MONOABS 0.7 12/18/2021 1230   EOSABS 0.1 12/18/2021 1230   EOSABS 0.1 01/21/2021 1618   BASOSABS 0.0 12/18/2021 1230   BASOSABS 0.0 01/21/2021 1618    Lab Results  Component Value Date   HGBA1C 5.7 08/10/2023       Assessment & Plan Congestive Heart Failure EF of 60-65% from echo 02/2023 Euvolemic Continue GDMT Continue to follow-up with cardiology.   Pulmonary Hypertension Severe from right heart cath - Continue Revatio  for pulmonary hypertension. -  Discuss management plan with cardiology team at upcoming appointment.  Type 2 Diabetes Mellitus Well-controlled with A1c of 5.7%. Slow weight loss despite maximum Ozempic  dose. No hypoglycemia. Insulin  glargine at 40 units nightly. - Continue Ozempic  2 mg weekly. - Adjust metformin  to 500 mg twice daily as her med list reveals 1000 mg twice daily but she has been taking 500 mg twice daily. - Send prescription for Dexcom sensors to [facility]. - Encourage increased physical activity. - Send prescription for  Ozempic  to pharmacy.   Hyperlipidemia - Controlled - Continue statin - Low-cholesterol diet   Hypertension in stage II CKD -Hypertensive and diabetic nephropathy - Blood pressure is controlled - Continue antihypertensive Avoid nephrotoxins Renal function showed improvement with creatinine of 1.57 down from 1.91.  Meds ordered this encounter  Medications   Semaglutide , 2 MG/DOSE, 8 MG/3ML SOPN    Sig: Inject 2 mg as directed once a week.    Dispense:  6 mL    Refill:  1   Continuous Glucose Sensor (DEXCOM G6 SENSOR) MISC    Sig: Use to check blood sugar as directed. Dx. E11.59    Dispense:  3 each    Refill:  11    Follow-up: Return in about 6 months (around 02/09/2024) for Chronic medical conditions.       Joaquin Mulberry, MD, FAAFP. [facility] [place], [place] [phone]   08/10/2023, 2:43 PM

## 2023-08-11 ENCOUNTER — Encounter (HOSPITAL_COMMUNITY)

## 2023-08-12 ENCOUNTER — Telehealth (HOSPITAL_COMMUNITY): Payer: Self-pay

## 2023-08-12 NOTE — Telephone Encounter (Signed)
 Called to confirm/remind patient of their appointment at [facility] on 5/28/205 3:00.   Appointment:   [x] Confirmed  [] Left mess   [] No answer/No voice mail  [] VM Full/unable to leave message  [] Phone not in service  Patient reminded to bring all medications and/or complete list.  Confirmed patient has transportation. Gave directions, instructed to utilize valet parking.

## 2023-08-14 NOTE — Progress Notes (Signed)
 [facility]IN[facility] NOTE  PCP: Joaquin Mulberry, MD Primary Cardiologist: Dorothye Gathers, MD HF Cardiologist: Dr. Julane Ny  CC: HF follow up  HPI: Deborah Coffey is a 68 y.o. with CAD s/p CABG x1 (LIMA-LAD) 2005, tobacco use, chronic diastolic CHF, anemia, depression, HLD, HTN, DM, CKD stage 3b, prior TIAs, pulmonary HTN (felt due to underlying lung disease/smoking), and morbid obesity.    Echo12/22 EF 65-70% RV moderately dilated with moderate HK. Flattened septum Moderate TR RVSP est 110 mmHG    Chest CT scan 2/22. No PE   R/L cath on 04/16/21: - mLAD 75% LIMA to LAD is widely patent. pRCA 50% - EF 65% - RA 5 PA 79/18 (39) PCW 12  CO 6.4 L/min; CI PVR 4.2  Sleep study 06/2021 only c/w mild OSA AHI 5.5   Echo 11/24 EF 60-65%, IVC flattened in systole and diastole, c/w RV pressure and volume overload.  RV severely enlarged w/ severely reduced systolic function. The estimated RVSP is 92.5 mmHg (comparred to 111 mmHg previous study). RA severely dilated. Moderate TR    PFTs 12/24: FEV1 1.27 (50%) FVC 1.84 (55),ratio 77, DLCOcor 48. Moderately severe restriction with reversibility (12% change) and severe diffusion defect. She is being followed by [facility] (Dr. Thelda Finney). He has also being following her for pulmonary nodules.   Echo 11/24 EF 60-65% RV mod reduced RVSP 92   RHC 3/25 demonstrated severe PAH w/ cor pulmonale, suspect Group 1 and possibly Group 3. She was started on Sildenafil  20 tid.  RA = 4, RV = 77/6, PA =  78/19 (42), PCW = 6, Fick CO/CI = 3.9/1.9, Thermo CO/CI = 3.6/1.7, PVR = 9.3 (Fick) 10.0 (TD), Ao sat = 96%, PA sat = 57%, 58%, PAPi = 14.8   Today she returns for AHF follow up. Overall feeling ***. Denies palpitations, CP, dizziness, edema, or PND/Orthopnea. *** SOB. Appetite ok. No fever or chills. Weight at home *** pounds. Taking all medications. Denies ETOH, tobacco or drug use.    Cardiac Studies: - Echo 11/24 EF 60-65%, IVC flattened in systole and  diastole, c/w RV pressure and volume overload,  RV severely enlarged w/ severely reduced systolic function.   - PFTs 2/23 FEV1 1.27 (61%) FVC 1.63 (61%) DLCO 43%  - R/LHC (12/22): EF 65%, mLAD 75% LIMA to LAD is widely patent. pRCA 50; RA 5 PA 79/18 (39) PCW 12  CO 6.4 L/min; CI PVR 4.2  - Echo (12/22): EF 65-70% RV moderately dilated with moderate HK. Flattened septum Moderate TR RVSP est    - Chest CT scan 2/22. No PE  Past Medical History:  Diagnosis Date   Allergy    Anemia    CAD in native artery 02/18/2004   Mid LAD lesoin just after Large D1 --> CABG x 1 LIMA-LAD   CAP (community acquired pneumonia) 08/07/2014   Chronic kidney disease, stage 3b (HCC)    Depression    High cholesterol    History of blood transfusion 04/20/2003   "related to OHS"    Hypertension    Lung nodules    Mild carotid artery disease (HCC)    Pulmonary hypertension (HCC)    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 competitive flow   TIA (transient ischemic attack)    Type II diabetes mellitus (HCC)    Current Outpatient Medications  Medication Sig Dispense Refill   acetaminophen  (TYLENOL ) 500 MG tablet Take 1,000 mg  by mouth every 6 (six) hours as needed for headache or moderate pain (pain score 4-6).     Alcohol Swabs (B-D SINGLE USE SWABS REGULAR) PADS USE AS DIRECTED 100 each 0   aspirin  EC 81 MG tablet Take 81 mg by mouth daily.     atorvastatin  (LIPITOR) 10 MG tablet Take 1 tablet (10 mg total) by mouth once daily. (Patient taking differently: Take 10 mg by mouth at bedtime.) 90 tablet 1   Blood Glucose Monitoring Suppl (ONETOUCH VERIO) w/Device KIT Use as instructed to test blood glucose 3 times daily. 1 kit 0   Continuous Glucose Sensor (DEXCOM G6 SENSOR) MISC Use to check blood sugar as directed. Dx. E11.59 3 each 11   cyanocobalamin (VITAMIN B12) 1000 MCG tablet Take 1,000 mcg by mouth daily.     dapagliflozin  propanediol  (FARXIGA ) 10 MG TABS tablet Take 1 tablet (10 mg total) by mouth daily before breakfast. 90 tablet 1   diclofenac  Sodium (VOLTAREN ) 1 % GEL Apply 2 g topically daily as needed (muscle pain). 150 g 1   furosemide  (LASIX ) 40 MG tablet Take 80 mg by mouth 2 (two) times daily.     gabapentin  (NEURONTIN ) 300 MG capsule Take 600 mg by mouth 2 (two) times daily.     glucose blood (ONETOUCH VERIO) test strip Use as instructed to test blood glucose 3 times daily. 100 each 3   insulin  glargine, 1 Unit Dial , (TOUJEO ) 300 UNIT/ML Solostar Pen Inject 40 Units into the skin daily.     Insulin  Pen Needle (DROPLET PEN NEEDLES) 31G X 8 MM MISC USE WITH TOUJEO  SOLOSTAR PEN EVERY DAY 100 each 0   Lancets (ONETOUCH DELICA PLUS LANCET33G) MISC Use as instructed to test blood glucose 3 times daily. 100 each 3   metFORMIN  (GLUCOPHAGE -XR) 500 MG 24 hr tablet Take 2 tablets (1,000 mg total) by mouth 2 (two) times daily. (Patient taking differently: Take 500 mg by mouth 2 (two) times daily.) 360 tablet 1   metoprolol  tartrate (LOPRESSOR ) 50 MG tablet Take 1 tablet (50 mg total) by mouth 2 (two) times daily. 180 tablet 1   Multiple Vitamin (MULTIVITAMIN WITH MINERALS) TABS tablet Take 1 tablet by mouth daily.     nitroGLYCERIN  (NITROSTAT ) 0.4 MG SL tablet Place 1 tablet (0.4 mg total) under the tongue every 5 (five) minutes as needed for chest pain. 25 tablet 0   pantoprazole  (PROTONIX ) 40 MG tablet Take 1 tablet (40 mg total) by mouth 2 (two) times daily. 180 tablet 1   Semaglutide , 2 MG/DOSE, 8 MG/3ML SOPN Inject 2 mg as directed once a week. 6 mL 1   sildenafil  (REVATIO ) 20 MG tablet Take 1 tablet (20 mg total) by mouth 3 (three) times daily. (Patient not taking: Reported on 08/10/2023) 270 tablet 3   Vitamin D , Cholecalciferol, 25 MCG (1000 UT) TABS Take 1,000 mcg by mouth daily.     No current facility-administered medications for this visit.   Allergies  Allergen Reactions   Ciprofloxacin Rash   Social History    Socioeconomic History   Marital status: Divorced    Spouse name: Not on file   Number of children: Not on file   Years of education: 12   Highest education level: Some college, no degree  Occupational History   Occupation: Disabled  Tobacco Use   Smoking status: Former    Current packs/day: 0.25    Average packs/day: 0.3 packs/day for 42.0 years (10.5 ttl pk-yrs)    Types: Cigarettes  Smokeless tobacco: Never   Tobacco comments:    Smokes 1-3 cigars/ black cigarettes a week. Agreed to referral to [place] quit line. Referral faxed. Patient given cessation information  Vaping Use   Vaping status: Some Days   Substances: Flavoring  Substance and Sexual Activity   Alcohol use: Not Currently   Drug use: No    Types: Marijuana    Comment: smoked pot years ago but not currently   Sexual activity: Not Currently  Other Topics Concern   Not on file  Social History Narrative   Lives with daughter and 3 grandchildren   Social Drivers of Health   Financial Resource Strain: Low Risk  (08/19/2022)   Overall Financial Resource Strain (CARDIA)    Difficulty of Paying Living Expenses: Not hard at all  Food Insecurity: No Food Insecurity (08/19/2022)   Hunger Vital Sign    Worried About Running Out of Food in the Last Year: Never true    Ran Out of Food in the Last Year: Never true  Transportation Needs: No Transportation Needs (08/19/2022)   PRAPARE - Administrator, Civil Service (Medical): No    Lack of Transportation (Non-Medical): No  Physical Activity: Inactive (08/19/2022)   Exercise Vital Sign    Days of Exercise per Week: 0 days    Minutes of Exercise per Session: 0 min  Stress: No Stress Concern Present (08/19/2022)   [employer] of Occupational Health - Occupational Stress Questionnaire    Feeling of Stress : Not at all  Social Connections: Not on file  Intimate Partner Violence: Not At Risk (02/03/2023)   Humiliation, Afraid, Rape, and Kick questionnaire    Fear  of Current or Ex-Partner: No    Emotionally Abused: No    Physically Abused: No    Sexually Abused: No   Family History  Problem Relation Age of Onset   Heart disease Mother    Diabetes Mother    Cancer Mother        cervical   Diabetes Father    Diabetes Sister    Heart disease Sister    Lupus Sister    Heart failure Sister    Hepatitis C Sister    Hypertension Daughter    Diabetes Brother    Heart attack Brother    Heart failure Brother    Diabetes Brother    Colon cancer Neg Hx    Breast cancer Neg Hx    LMP  (LMP Unknown)   Wt Readings from Last 3 Encounters:  08/10/23 101.4 kg (223 lb 9.6 oz)  07/14/23 99.8 kg (220 lb)  06/24/23 101.2 kg (223 lb)   PHYSICAL EXAM: General:  *** appearing.  No respiratory difficulty HEENT: normal Neck: supple. JVD *** cm.  Cor: PMI nondisplaced. Regular rate & rhythm. No rubs, gallops or murmurs. Lungs: clear Abdomen: soft, nontender, nondistended. Good bowel sounds. Extremities: no cyanosis, clubbing, rash, edema  Neuro: alert & oriented x 3. Moves all 4 extremities w/o difficulty. Affect pleasant.   EKG: NSR 80s (3/25)  Personally reviewed  ASSESSMENT & PLAN:  1. Pulmonary HTN with RV failure/cor pulmonale - Echo (12/22): EF 65-70% RV moderately dilated with moderate HK, flattened septum, moderate TR, RVSP est 110 mmHG   - Chest CT scan (2/22): No PE - RHC (12/22): RA 5 PA 79/18 (39) PCW 12  CO 6.4 L/min; CI PVR 4.2 - PFTs with significant restrictive/obstructive lung disease - ANA, ANCA, Hopkins-70, RF all negative. - Sleep study with mild  OSA.  - Suspect mostly WHO Group III. - Echo 11/24 EF 60-65% RV mod reduced RVSP 92  - RHC 3/25: RA 4, PA 78/19, PCW 6, FICK 3.9/1.9, TD 3.6/1.7 PVR 10, Ao sat 95%, PA sat 57%, PAPi 14.8 c/w severe PAH w/ cor pulmonale, suspect Group 1 and possibly Group 3. She was started on Sildenafil  20 tid  - NYHA II-III - Volume status ok - Continue Lasix  80 mg bid. - Continue Farxiga  10 mg daily. -  Consider weaning metoprolol   *** - Off lisinopril  with previous AKI and hypotension  - I continue to feel that she is high risk for OSA and nocturnal hypoxia. Previous sleep study with just mild OSE. Check ONOX *** ? - Hall walk today sat >= 90% without O2 ***  2. CAD - CABG 2005 LIMA-> LAD - Cath (04/16/21): mLAD 75% LIMA to LAD is widely patent. pRCA 50 - No s/s angina  - Continue ASA/statin. - Management per Dr. Renna Cary.  3. Morbid obesity - There is no height or weight on file to calculate BMI.. - Continue weight loss efforts with Ozempic  per PCP  4. OSA - Mild on sleep study. Not on CPAP - Has follow up with Pulm  5. HTN - BP controlled   Follow up ***  Sheryl Donna, NP  8:41 PM

## 2023-08-15 ENCOUNTER — Encounter (HOSPITAL_COMMUNITY): Payer: Self-pay

## 2023-08-15 ENCOUNTER — Ambulatory Visit (HOSPITAL_COMMUNITY)
Admission: RE | Admit: 2023-08-15 | Discharge: 2023-08-15 | Disposition: A | Discharge status: Home / Self Care | Source: Ambulatory Visit | Attending: Internal Medicine | Admitting: Internal Medicine

## 2023-08-15 ENCOUNTER — Telehealth: Payer: Self-pay | Admitting: Family Medicine

## 2023-08-15 VITALS — BP 132/64 | HR 90 | Ht 64.0 in | Wt 220.6 lb

## 2023-08-15 DIAGNOSIS — Z951 Presence of aortocoronary bypass graft: Secondary | ICD-10-CM | POA: Diagnosis not present

## 2023-08-15 DIAGNOSIS — J449 Chronic obstructive pulmonary disease, unspecified: Secondary | ICD-10-CM | POA: Diagnosis not present

## 2023-08-15 DIAGNOSIS — M1A0710 Idiopathic chronic gout, right ankle and foot, without tophus (tophi): Secondary | ICD-10-CM

## 2023-08-15 DIAGNOSIS — Z794 Long term (current) use of insulin: Secondary | ICD-10-CM | POA: Insufficient documentation

## 2023-08-15 DIAGNOSIS — I2781 Cor pulmonale (chronic): Secondary | ICD-10-CM | POA: Insufficient documentation

## 2023-08-15 DIAGNOSIS — F17210 Nicotine dependence, cigarettes, uncomplicated: Secondary | ICD-10-CM | POA: Insufficient documentation

## 2023-08-15 DIAGNOSIS — I130 Hypertensive heart and chronic kidney disease with heart failure and stage 1 through stage 4 chronic kidney disease, or unspecified chronic kidney disease: Secondary | ICD-10-CM | POA: Insufficient documentation

## 2023-08-15 DIAGNOSIS — Z7984 Long term (current) use of oral hypoglycemic drugs: Secondary | ICD-10-CM | POA: Diagnosis not present

## 2023-08-15 DIAGNOSIS — Z7985 Long-term (current) use of injectable non-insulin antidiabetic drugs: Secondary | ICD-10-CM | POA: Diagnosis not present

## 2023-08-15 DIAGNOSIS — E669 Obesity, unspecified: Secondary | ICD-10-CM | POA: Diagnosis not present

## 2023-08-15 DIAGNOSIS — Z7982 Long term (current) use of aspirin: Secondary | ICD-10-CM | POA: Diagnosis not present

## 2023-08-15 DIAGNOSIS — R918 Other nonspecific abnormal finding of lung field: Secondary | ICD-10-CM | POA: Diagnosis not present

## 2023-08-15 DIAGNOSIS — I2510 Atherosclerotic heart disease of native coronary artery without angina pectoris: Secondary | ICD-10-CM | POA: Insufficient documentation

## 2023-08-15 DIAGNOSIS — G4733 Obstructive sleep apnea (adult) (pediatric): Secondary | ICD-10-CM | POA: Insufficient documentation

## 2023-08-15 DIAGNOSIS — I5032 Chronic diastolic (congestive) heart failure: Secondary | ICD-10-CM | POA: Diagnosis not present

## 2023-08-15 DIAGNOSIS — E785 Hyperlipidemia, unspecified: Secondary | ICD-10-CM | POA: Insufficient documentation

## 2023-08-15 DIAGNOSIS — N1832 Chronic kidney disease, stage 3b: Secondary | ICD-10-CM | POA: Diagnosis not present

## 2023-08-15 DIAGNOSIS — Z720 Tobacco use: Secondary | ICD-10-CM

## 2023-08-15 DIAGNOSIS — I10 Essential (primary) hypertension: Secondary | ICD-10-CM

## 2023-08-15 DIAGNOSIS — E1122 Type 2 diabetes mellitus with diabetic chronic kidney disease: Secondary | ICD-10-CM | POA: Insufficient documentation

## 2023-08-15 DIAGNOSIS — Z8673 Personal history of transient ischemic attack (TIA), and cerebral infarction without residual deficits: Secondary | ICD-10-CM | POA: Insufficient documentation

## 2023-08-15 DIAGNOSIS — F17290 Nicotine dependence, other tobacco product, uncomplicated: Secondary | ICD-10-CM | POA: Diagnosis not present

## 2023-08-15 DIAGNOSIS — Z6837 Body mass index (BMI) 37.0-37.9, adult: Secondary | ICD-10-CM | POA: Insufficient documentation

## 2023-08-15 DIAGNOSIS — I272 Pulmonary hypertension, unspecified: Secondary | ICD-10-CM | POA: Diagnosis not present

## 2023-08-15 DIAGNOSIS — M79671 Pain in right foot: Secondary | ICD-10-CM | POA: Diagnosis not present

## 2023-08-15 DIAGNOSIS — I50810 Right heart failure, unspecified: Secondary | ICD-10-CM | POA: Diagnosis not present

## 2023-08-15 DIAGNOSIS — Z79899 Other long term (current) drug therapy: Secondary | ICD-10-CM | POA: Diagnosis not present

## 2023-08-15 LAB — BASIC METABOLIC PANEL WITH GFR
Anion gap: 10 (ref 5–15)
BUN: 35 mg/dL — ABNORMAL HIGH (ref 8–23)
CO2: 27 mmol/L (ref 22–32)
Calcium: 9.6 mg/dL (ref 8.9–10.3)
Chloride: 104 mmol/L (ref 98–111)
Creatinine, Ser: 2.15 mg/dL — ABNORMAL HIGH (ref 0.44–1.00)
GFR, Estimated: 25 mL/min — ABNORMAL LOW (ref >60)
Glucose, Bld: 95 mg/dL (ref 70–99)
Potassium: 3.8 mmol/L (ref 3.5–5.1)
Sodium: 141 mmol/L (ref 135–145)

## 2023-08-15 LAB — BRAIN NATRIURETIC PEPTIDE: B Natriuretic Peptide: 566.5 pg/mL — ABNORMAL HIGH (ref 0.0–100.0)

## 2023-08-15 MED ORDER — COLCHICINE 0.6 MG PO TABS: 0.6000 mg | ORAL_TABLET | Freq: Every day | ORAL | 0 refills | Status: DC

## 2023-08-15 NOTE — Patient Instructions (Addendum)
 Medication Changes:  START: COLCHICINE 0.6MG  ONCE DAILY FOR 3 DAYS ONLY   Lab Work:  Labs done today, your results will be available in MyChart, we will contact you for abnormal readings.  PLEASE HAVE YOUR EYE DOCTOR FAX US  PAPERWORK FOR YOUR CLEARANCE THE FAX NUMBER IS [phone]  Follow-Up in: AS SCHEDULED WITH DR. Julane Ny   At the [facility], you and your health needs are our priority. We have a designated team specialized in the treatment of Heart Failure. This Care Team includes your primary Heart Failure Specialized Cardiologist (physician), Advanced Practice Providers (APPs- Physician Assistants and Nurse Practitioners), and Pharmacist who all work together to provide you with the care you need, when you need it.   You may see any of the following providers on your designated Care Team at your next follow up:  Dr. Jules Oar Dr. Peder Bourdon Dr. Alwin Baars Dr. Judyth Nunnery Nieves Bars, NP Ruddy Corral, [place] [facility], [place] Dennise Fitz, NP [place] Lee, NP Luster Salters, PharmD   Please be sure to bring in all your medications bottles to every appointment.   Need to Contact Us :  If you have any questions or concerns before your next appointment please send us  a message through [place] or call our office at [phone].    TO LEAVE A MESSAGE FOR THE NURSE SELECT OPTION 2, PLEASE LEAVE A MESSAGE INCLUDING: YOUR NAME DATE OF BIRTH CALL BACK NUMBER REASON FOR CALL**this is important as we prioritize the call backs  YOU WILL RECEIVE A CALL BACK THE SAME DAY AS LONG AS YOU CALL BEFORE 4:00 PM

## 2023-08-15 NOTE — Telephone Encounter (Signed)
 Copied from CRM [id number]. Topic: Clinical - Prescription Issue  >> Aug 15, 2023 12:48 PM Star [place] wrote: Reason for CRM: Solidad with [facility] pharmacy needs a prescription for  [facility] G6 receiver and transmitter- please send to [facility] - [place], [place] - [address] [place] [address] Phone: [phone] Fax: [phone]

## 2023-08-15 NOTE — Telephone Encounter (Signed)
 Deborah Coffey is this something you can help me with

## 2023-08-22 ENCOUNTER — Encounter: Payer: Self-pay | Admitting: Nurse Practitioner

## 2023-08-22 ENCOUNTER — Ambulatory Visit: Admitting: Nurse Practitioner

## 2023-08-22 VITALS — BP 110/62 | HR 89 | Ht 64.0 in | Wt 222.2 lb

## 2023-08-22 DIAGNOSIS — I272 Pulmonary hypertension, unspecified: Secondary | ICD-10-CM

## 2023-08-22 DIAGNOSIS — J984 Other disorders of lung: Secondary | ICD-10-CM

## 2023-08-22 DIAGNOSIS — R911 Solitary pulmonary nodule: Secondary | ICD-10-CM

## 2023-08-22 DIAGNOSIS — Z720 Tobacco use: Secondary | ICD-10-CM

## 2023-08-22 DIAGNOSIS — F17210 Nicotine dependence, cigarettes, uncomplicated: Secondary | ICD-10-CM | POA: Diagnosis not present

## 2023-08-22 DIAGNOSIS — J84115 Respiratory bronchiolitis interstitial lung disease: Secondary | ICD-10-CM

## 2023-08-22 MED ORDER — BUPROPION HCL ER (SR) 150 MG PO TB12: ORAL_TABLET | ORAL | 2 refills | Status: DC

## 2023-08-22 NOTE — Assessment & Plan Note (Addendum)
 Severely elevated pulmonary artery pressures on RHC. Group II/III. Prior sleep study with very mild OSA. Euvolemic on exam today. Compliant with sildenafil , lasix , and farxiga . Follow up with Dr. Bensimhon as scheduled.

## 2023-08-22 NOTE — Patient Instructions (Addendum)
 Continue Albuterol  inhaler 2 puffs every 6 hours as needed for shortness of breath or wheezing. Notify if symptoms persist despite rescue inhaler/neb use.    Start Wellbutrin  - 1 tab (150 mg) daily for 3 days then increase to 1 tab twice daily. Notify of any mood changes immediately. Do not drink alcohol while you are on this. This should help with quitting smoking. Gradually cut back with aim quit date in 4-6 weeks  Monitor your blood pressure closely while on the wellbutrin  (bupropion ) and taking metoprolol . If you're noticing blood pressures <100/60 or getting dizzy/lightheaded, notify us  or Dr. Newlin immediately so we can address this and adjust medications if needed   Follow up with Dr. Julane Ny as scheduled Monitor your weights and notify if you gain 2-3 lb overnight or 5 lb in a week  Glad you are doing well!   Follow up in 6 weeks with Dr. Marygrace Snellen (new pt slot) or Katie Devlin Brink,NP. If symptoms do not improve or worsen, please contact office for sooner follow up or seek emergency care.

## 2023-08-22 NOTE — Assessment & Plan Note (Signed)
 Lung RADS 2. Next LDCT chest 12/2023

## 2023-08-22 NOTE — Assessment & Plan Note (Signed)
 Smoking cessation strongly advised. Will start her on Wellbutrin  for smoking cessation. Side effect profile reviewed. Understands proper use of medication and to monitor for any mood changes.    The patient's current tobacco use: 1/4 ppd The patient was advised to quit and impact of smoking on their health.  I assessed the patient's willingness to attempt to quit. I provided methods and skills for cessation. We reviewed medication management of smoking session drugs if appropriate. Resources to help quit smoking were provided. A smoking cessation quit date was set: 09/22/2023 Follow-up was arranged in our clinic.  The amount of time spent counseling patient was 5 mins

## 2023-08-22 NOTE — Progress Notes (Signed)
 @Patient  ID: Deborah Coffey, female    DOB: 04-May-1955, 68 y.o.   MRN: [id number]  Chief Complaint  Patient presents with   Follow-up    Patient states she doing well    Referring provider: Joaquin Mulberry, MD  HPI: 68 year old female, active smoker followed for lung nodules and restrictive lung disease. She is a patient of Dr. Glenis Langdon and last seen in office 03/23/2023 by [facility] NP. Past medical history significant for CHF, PH, HTN, CAD s/p CABG, DM insulin  dependent, eczema, anxiety, depression, HLD, hx of TIA.  TEST/EVENTS:  04/16/2021 cardiac cath: no significant obstructive disease. LV function nl. Moderate pulm HTN.  01/13/2023 CT chest nodule follow-up: Mild cardiomegaly with prior CABG.  Pulmonary artery enlargement.  Mild centrilobular emphysema.  Diffuse upper lobe predominant ill-defined micro nodularity, smoking-related respiratory bronchiolitis.  Right upper lobe pulmonary nodule on prior exam has resolved.  Other pulmonary nodules not significantly changed, up to 4.3 mm.  Lung RADS 2. 02/25/2023 echo: EF 60-65%. Interventricular septum flattening, consistent with volume overload. RV function moderately reduced and severely enlarged. Severely elevated PASP. RA severely dilated. Moderate TR. PR moderate.  03/21/2023 PFT: FVC 55, FEV1 50, ratio 77, TLC 70, DLCOcor 48. Moderately severe restriction with reversibility (12% change) and severe diffusing defect   12/29/2022: OV with Dr. Thelda Finney. Former smoker with small pack year hx. CT chest lung nodule follow up that was completed 06/23/2022. Referred for evaluation having waxing and waning nodules and a new solid nodule in RUL, measuring 5.4 mm. Repeat CT chest ordered. Heavily counseled on smoking cessation. Given albuterol  to trial for DOE. Will obtain PFT.   03/23/2023: OV with Jakyrie Totherow NP for follow up after PFT which revealed a moderate restrictive defect with moderately severe diffusing defect. She did have some reversibility in her lung  function following administration of bronchodilators; however, no formal obstruction based on FEV1/FVC ratio >70. She has used the albuterol  but it is infrequent. Maybe 1-2 times a month. She's not sure if it helps or not. She does get winded when she first starts walking but once she's up and moving, she tends to feel better. She still has to rest after longer distances or uphill climbing. She does not currently have a cough but tends to get one with changes of the season, especially the fall and spring. She does have allergy symptoms, especially in the fall, with sinus congestion/drainage. She does not take a daily allergy pill. She does not have a history of asthma but has had issues with bronchitis in the past. She has not required any treatment recently for her breathing or cough with steroids or antibiotics. She denies fevers, chills, wheezing, chest congestion, hemoptysis.  She had an enlarged pulmonary artery on her CT scan and evidence of smoking related respiratory bronchiolitis. No IPF. She is an active smoker; 6 cigarettes a day. She does have a known history of pulmonary hypertension and CHF. She is followed by Dr. Bensimhon but has not been seen in quite some time. She had a sleep study in 2023, which revealed very mild sleep apnea; unlikely cause of her PH. She does have some occasional leg swelling, usually in her ankles. She has not noticed any recent weight gain at home but dry weight is usually 230 lb. No orthopnea, PND, CP.   08/22/2023: Today - follow up Patient presents today for overdue follow up. At her last visit, she had PFTs which showed a restriction in lung function felt to be secondary to  volume overload/body habitus. No formal obstruction but did have a some smoking related bronchiolitis and emphysema on prior imaging. We started Symbicort ; however, she tells me she never used this. She did get back in with Dr. Bensimhon, who adjusted her HF/PH medications. She also had a right heart  catheterization that revealed severe PH. Since then, her breathing has been doing well she feels. She's not having any issues with cough, chest congestion, wheezing. No leg swelling, orthopnea, PND, CP, palpitations. She's not using any inhalers right now.  She is still smoking a couple of cigarettes a day. She never started the Wellbutrin  after our last visit. She does want help quitting smoking. She is willing to retry the Wellbutrin . Her depression has been stable. No SI/HI.   Allergies  Allergen Reactions   Ciprofloxacin Rash    Immunization History  Administered Date(s) Administered   Influenza,inj,Quad PF,6+ Mos 03/28/2015, 02/18/2019, 04/27/2021, 01/20/2022   PNEUMOCOCCAL CONJUGATE-20 04/27/2021   Pneumococcal Polysaccharide-23 01/31/2018, 06/23/2018   Zoster Recombinant(Shingrix) 06/13/2019    Past Medical History:  Diagnosis Date   Allergy    Anemia    CAD in native artery 02/18/2004   Mid LAD lesoin just after Large D1 --> CABG x 1 LIMA-LAD   CAP (community acquired pneumonia) 08/07/2014   Chronic kidney disease, stage 3b (HCC)    Depression    High cholesterol    History of blood transfusion 04/20/2003   "related to OHS"    Hypertension    Lung nodules    Mild carotid artery disease (HCC)    Pulmonary hypertension (HCC)    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 competitive flow   TIA (transient ischemic attack)    Type II diabetes mellitus (HCC)     Tobacco History: Social History   Tobacco Use  Smoking Status Every Day   Current packs/day: 0.25   Average packs/day: 0.3 packs/day for 42.0 years (10.5 ttl pk-yrs)   Types: Cigarettes  Smokeless Tobacco Current  Tobacco Comments   5 Cigarettes a day. 08/22/2023   Ready to quit: Not Answered Counseling given: Not Answered Tobacco comments: 5 Cigarettes a day. 08/22/2023   Outpatient Medications Prior to Visit  Medication Sig Dispense Refill   acetaminophen   (TYLENOL ) 500 MG tablet Take 1,000 mg by mouth every 6 (six) hours as needed for headache or moderate pain (pain score 4-6).     Alcohol Swabs (B-D SINGLE USE SWABS REGULAR) PADS USE AS DIRECTED 100 each 0   aspirin  EC 81 MG tablet Take 81 mg by mouth daily.     atorvastatin  (LIPITOR) 10 MG tablet Take 1 tablet (10 mg total) by mouth once daily. 90 tablet 1   Blood Glucose Monitoring Suppl (ONETOUCH VERIO) w/Device KIT Use as instructed to test blood glucose 3 times daily. 1 kit 0   colchicine  0.6 MG tablet Take 1 tablet (0.6 mg total) by mouth daily. FOR 3 DAYS 3 tablet 0   Continuous Glucose Sensor (DEXCOM G6 SENSOR) MISC Use to check blood sugar as directed. Dx. E11.59 3 each 11   cyanocobalamin (VITAMIN B12) 1000 MCG tablet Take 1,000 mcg by mouth daily.     dapagliflozin  propanediol (FARXIGA ) 10 MG TABS tablet Take 1 tablet (10 mg total) by mouth daily before breakfast. 90 tablet 1   diclofenac  Sodium (VOLTAREN ) 1 % GEL Apply 2 g topically daily as needed (muscle pain). 150 g 1   furosemide  (LASIX ) 40 MG tablet  Take 80 mg by mouth 2 (two) times daily.     gabapentin  (NEURONTIN ) 300 MG capsule Take 600 mg by mouth 2 (two) times daily.     glucose blood (ONETOUCH VERIO) test strip Use as instructed to test blood glucose 3 times daily. 100 each 3   insulin  glargine, 1 Unit Dial , (TOUJEO ) 300 UNIT/ML Solostar Pen Inject 40 Units into the skin daily.     Insulin  Pen Needle (DROPLET PEN NEEDLES) 31G X 8 MM MISC USE WITH TOUJEO  SOLOSTAR PEN EVERY DAY 100 each 0   Lancets (ONETOUCH DELICA PLUS LANCET33G) MISC Use as instructed to test blood glucose 3 times daily. 100 each 3   metFORMIN  (GLUCOPHAGE -XR) 500 MG 24 hr tablet Take 2 tablets (1,000 mg total) by mouth 2 (two) times daily. 360 tablet 1   metoprolol  tartrate (LOPRESSOR ) 50 MG tablet Take 1 tablet (50 mg total) by mouth 2 (two) times daily. 180 tablet 1   Multiple Vitamin (MULTIVITAMIN WITH MINERALS) TABS tablet Take 1 tablet by mouth daily.      nitroGLYCERIN  (NITROSTAT ) 0.4 MG SL tablet Place 1 tablet (0.4 mg total) under the tongue every 5 (five) minutes as needed for chest pain. 25 tablet 0   pantoprazole  (PROTONIX ) 40 MG tablet Take 1 tablet (40 mg total) by mouth 2 (two) times daily. 180 tablet 1   Semaglutide , 2 MG/DOSE, 8 MG/3ML SOPN Inject 2 mg as directed once a week. 6 mL 1   sildenafil  (REVATIO ) 20 MG tablet Take 1 tablet (20 mg total) by mouth 3 (three) times daily. 270 tablet 3   Vitamin D , Cholecalciferol, 25 MCG (1000 UT) TABS Take 1,000 mcg by mouth daily.     No facility-administered medications prior to visit.     Review of Systems:   Constitutional: No weight loss or gain, night sweats, fevers, chills, fatigue, or lassitude.  HEENT: No headaches, difficulty swallowing, tooth/dental problems, or sore throat. No sneezing, itching, ear ache +occasional allergies  CV:  No chest pain, orthopnea, swelling in lower extremities, PND, anasarca, dizziness, palpitations, syncope Resp: +shortness of breath with exertion (improved). No excess mucus or change in color of mucus. No productive or non-productive. No hemoptysis. No wheezing.  No chest wall deformity GI:  No heartburn, indigestion, abdominal pain, nausea, vomiting, diarrhea, change in bowel habits, loss of appetite, bloody stools.  GU: No dysuria, change in color of urine, urgency or frequency.   Skin: No rash, lesions, ulcerations MSK:  No joint pain or swelling.   Neuro: No dizziness or lightheadedness.  Psych: No depression or anxiety. Mood stable.     Physical Exam:  BP 110/62 (BP Location: Right Arm, Patient Position: Sitting, Cuff Size: Large)   Pulse 89   Ht 5\' 4"  (1.626 m)   Wt 222 lb 3.2 oz (100.8 kg)   LMP  (LMP Unknown)   SpO2 98%   BMI 38.14 kg/m   GEN: Pleasant, interactive, well-appearing; obese; in no acute distress HEENT:  Normocephalic and atraumatic. PERRLA. Sclera white. Nasal turbinates pink, moist and patent bilaterally. No  rhinorrhea present. Oropharynx pink and moist, without exudate or edema. No lesions, ulcerations, or postnasal drip.  NECK:  Supple w/ fair ROM. No JVD present. Normal carotid impulses w/o bruits. Thyroid  symmetrical with no goiter or nodules palpated. No lymphadenopathy.   CV: RRR, no m/r/g, no peripheral edema. Pulses intact, +2 bilaterally. No cyanosis, pallor or clubbing. PULMONARY:  Unlabored, regular breathing. Clear bilaterally A&P w/o wheezes/rales/rhonchi. No accessory muscle use.  GI:  BS present and normoactive. Soft, non-tender to palpation. No organomegaly or masses detected.  MSK: No erythema, warmth or tenderness. Cap refil <2 sec all extrem. No deformities or joint swelling noted.  Neuro: A/Ox3. No focal deficits noted.   Skin: Warm, no lesions or rashe Psych: Normal affect and behavior. Judgement and thought content appropriate.     Lab Results:  CBC    Component Value Date/Time   WBC 6.7 06/24/2023 1150   RBC 4.87 06/24/2023 1150   HGB 13.3 07/14/2023 1058   HGB 12.2 07/14/2023 1058   HGB 12.1 04/10/2021 1248   HCT 39.0 07/14/2023 1058   HCT 36.0 07/14/2023 1058   HCT 37.0 04/10/2021 1248   PLT 261 06/24/2023 1150   PLT 261 04/10/2021 1248   MCV 88.9 06/24/2023 1150   MCV 83 04/10/2021 1248   MCH 28.1 06/24/2023 1150   MCHC 31.6 06/24/2023 1150   RDW 16.8 (H) 06/24/2023 1150   RDW 13.7 04/10/2021 1248   LYMPHSABS 1.5 12/18/2021 1230   LYMPHSABS 1.9 01/21/2021 1618   MONOABS 0.7 12/18/2021 1230   EOSABS 0.1 12/18/2021 1230   EOSABS 0.1 01/21/2021 1618   BASOSABS 0.0 12/18/2021 1230   BASOSABS 0.0 01/21/2021 1618    BMET    Component Value Date/Time   NA 141 08/15/2023 1610   NA 140 11/02/2022 1500   K 3.8 08/15/2023 1610   CL 104 08/15/2023 1610   CO2 27 08/15/2023 1610   GLUCOSE 95 08/15/2023 1610   BUN 35 (H) 08/15/2023 1610   BUN 28 (H) 11/02/2022 1500   CREATININE 2.15 (H) 08/15/2023 1610   CREATININE 1.02 02/27/2015 1236   CALCIUM  9.6  08/15/2023 1610   GFRNONAA 25 (L) 08/15/2023 1610   GFRNONAA 61 02/27/2015 1236   GFRAA 46 (L) 10/04/2019 0940   GFRAA 71 02/27/2015 1236    BNP    Component Value Date/Time   BNP 566.5 (H) 08/15/2023 1606     Imaging:  No results found.  Administration History     None          Latest Ref Rng & Units 03/21/2023    3:59 PM 05/28/2021    9:53 AM  PFT Results  FVC-Pre L 1.84  1.63   FVC-Predicted Pre % 55  61   FVC-Post L 1.86  1.71   FVC-Predicted Post % 56  64   Pre FEV1/FVC % % 69  78   Post FEV1/FCV % % 77  81   FEV1-Pre L 1.27  1.27   FEV1-Predicted Pre % 50  61   FEV1-Post L 1.43  1.39   DLCO uncorrected ml/min/mmHg 9.80  9.06   DLCO UNC% % 47  43   DLCO corrected ml/min/mmHg 9.95    DLCO COR %Predicted % 48    DLVA Predicted % 90  78   TLC L 3.68  3.33   TLC % Predicted % 70  63   RV % Predicted % 87  77     No results found for: "NITRICOXIDE"      Assessment & Plan:   Restrictive lung disease Likely multifactorial. Volume status improved with improved dyspnea. Encouraged to work on healthy weight loss and smoking cessation. Reversibility on PFTs, consistent with possibility of asthma. No significant change with SABA in the past. Never tried ICS/LABA. No respiratory symptoms reported today. Continue to monitor.  Patient Instructions  Continue Albuterol  inhaler 2 puffs every 6 hours as needed for shortness of breath or wheezing. Notify  if symptoms persist despite rescue inhaler/neb use.    Start Wellbutrin  - 1 tab (150 mg) daily for 3 days then increase to 1 tab twice daily. Notify of any mood changes immediately. Do not drink alcohol while you are on this. This should help with quitting smoking. Gradually cut back with aim quit date in 4-6 weeks  Monitor your blood pressure closely while on the wellbutrin  (bupropion ) and taking metoprolol . If you're noticing blood pressures <100/60 or getting dizzy/lightheaded, notify us  or Dr. Newlin immediately so  we can address this and adjust medications if needed   Follow up with Dr. Julane Ny as scheduled Monitor your weights and notify if you gain 2-3 lb overnight or 5 lb in a week  Glad you are doing well!   Follow up in 6 weeks with Dr. Marygrace Snellen (new pt slot) or Katie Daja Shuping,NP. If symptoms do not improve or worsen, please contact office for sooner follow up or seek emergency care.    Pulmonary hypertension (HCC) Severely elevated pulmonary artery pressures on RHC. Group II/III. Prior sleep study with very mild OSA. Euvolemic on exam today. Compliant with sildenafil , lasix , and farxiga . Follow up with Dr. Julane Ny as scheduled.   Tobacco use Smoking cessation strongly advised. Will start her on Wellbutrin  for smoking cessation. Side effect profile reviewed. Understands proper use of medication and to monitor for any mood changes.    The patient's current tobacco use: 1/4 ppd The patient was advised to quit and impact of smoking on their health.  I assessed the patient's willingness to attempt to quit. I provided methods and skills for cessation. We reviewed medication management of smoking session drugs if appropriate. Resources to help quit smoking were provided. A smoking cessation quit date was set: 09/22/2023 Follow-up was arranged in our clinic.  The amount of time spent counseling patient was 5 mins   Respiratory bronchiolitis interstitial lung disease (HCC) See above. Smoking cessation advised.   Lung nodule Lung RADS 2. Next LDCT chest 12/2023    Advised if symptoms do not improve or worsen, to please contact office for sooner follow up or seek emergency care.   I spent 35 minutes of dedicated to the care of this patient on the date of this encounter to include pre-visit review of records, face-to-face time with the patient discussing conditions above, post visit ordering of testing, clinical documentation with the electronic health record, making appropriate referrals as  documented, and communicating necessary findings to members of the patients care team.  Roetta Clarke, NP 08/22/2023  Pt aware and understands NP's role.

## 2023-08-22 NOTE — Assessment & Plan Note (Signed)
 See above. Smoking cessation advised.

## 2023-08-22 NOTE — Assessment & Plan Note (Addendum)
 Likely multifactorial. Volume status improved with improved dyspnea. Encouraged to work on healthy weight loss and smoking cessation. Reversibility on PFTs, consistent with possibility of asthma. No significant change with SABA in the past. Never tried ICS/LABA. No respiratory symptoms reported today. Continue to monitor.  Patient Instructions  Continue Albuterol  inhaler 2 puffs every 6 hours as needed for shortness of breath or wheezing. Notify if symptoms persist despite rescue inhaler/neb use.    Start Wellbutrin  - 1 tab (150 mg) daily for 3 days then increase to 1 tab twice daily. Notify of any mood changes immediately. Do not drink alcohol while you are on this. This should help with quitting smoking. Gradually cut back with aim quit date in 4-6 weeks  Monitor your blood pressure closely while on the wellbutrin  (bupropion ) and taking metoprolol . If you're noticing blood pressures <100/60 or getting dizzy/lightheaded, notify us  or Dr. Newlin immediately so we can address this and adjust medications if needed   Follow up with Dr. Julane Ny as scheduled Monitor your weights and notify if you gain 2-3 lb overnight or 5 lb in a week  Glad you are doing well!   Follow up in 6 weeks with Dr. Marygrace Snellen (new pt slot) or Katie Rodd Heft,NP. If symptoms do not improve or worsen, please contact office for sooner follow up or seek emergency care.

## 2023-08-23 ENCOUNTER — Ambulatory Visit: Payer: Medicare PPO | Attending: Family Medicine

## 2023-08-23 VITALS — Ht 65.0 in | Wt 220.0 lb

## 2023-08-23 DIAGNOSIS — Z0000 Encounter for general adult medical examination without abnormal findings: Secondary | ICD-10-CM | POA: Diagnosis not present

## 2023-08-23 NOTE — Progress Notes (Signed)
 Because this visit was a virtual/telehealth visit,  certain criteria was not obtained, such a blood pressure, CBG if applicable, and timed get up and go. Any medications not marked as "taking" were not mentioned during the medication reconciliation part of the visit. Any vitals not documented were not able to be obtained due to this being a telehealth visit or patient was unable to self-report a recent blood pressure reading due to a lack of equipment at home via telehealth. Vitals that have been documented are verbally provided by the patient.   Subjective:   Deborah Coffey is a 68 y.o. who presents for a Medicare Wellness preventive visit.  Visit Complete: Virtual I connected with  Joud Rix on 08/23/23 by a audio enabled telemedicine application and verified that I am speaking with the correct person using two identifiers.  Patient Location: Home  Provider Location: Office/Clinic  I discussed the limitations of evaluation and management by telemedicine. The patient expressed understanding and agreed to proceed.  Vital Signs: Because this visit was a virtual/telehealth visit, some criteria may be missing or patient reported. Any vitals not documented were not able to be obtained and vitals that have been documented are patient reported.  VideoDeclined- This patient declined Librarian, academic. Therefore the visit was completed with audio only.  Persons Participating in Visit: Patient.  AWV Questionnaire: No: Patient Medicare AWV questionnaire was not completed prior to this visit.  Cardiac Risk Factors include: advanced age (>79men, >70 women);diabetes mellitus;dyslipidemia;family history of premature cardiovascular disease;hypertension;obesity (BMI >30kg/m2);sedentary lifestyle;smoking/ tobacco exposure     Objective:    Today's Vitals   08/23/23 1001  Weight: 220 lb (99.8 kg)  Height: 5\' 5"  (1.651 m)  PainSc: 0-No pain   Body mass index  is 36.61 kg/m.     08/23/2023   10:03 AM 07/14/2023    7:51 AM 03/07/2023   12:09 AM 08/19/2022   10:41 AM 12/18/2021   11:39 AM 09/04/2021   12:54 PM 07/05/2021    8:00 PM  Advanced Directives  Does Patient Have a Medical Advance Directive? No No No No No No No  Would patient like information on creating a medical advance directive? No - Patient declined     No - Patient declined No - Patient declined    Current Medications (verified) Outpatient Encounter Medications as of 08/23/2023  Medication Sig   acetaminophen  (TYLENOL ) 500 MG tablet Take 1,000 mg by mouth every 6 (six) hours as needed for headache or moderate pain (pain score 4-6).   Alcohol Swabs (B-D SINGLE USE SWABS REGULAR) PADS USE AS DIRECTED   aspirin  EC 81 MG tablet Take 81 mg by mouth daily.   atorvastatin  (LIPITOR) 10 MG tablet Take 1 tablet (10 mg total) by mouth once daily.   Blood Glucose Monitoring Suppl (ONETOUCH VERIO) w/Device KIT Use as instructed to test blood glucose 3 times daily.   buPROPion  (WELLBUTRIN  SR) 150 MG 12 hr tablet Take 1 tablet (150 mg total) by mouth daily for 3 days, THEN 1 tablet (150 mg total) 2 (two) times daily.   colchicine  0.6 MG tablet Take 1 tablet (0.6 mg total) by mouth daily. FOR 3 DAYS   Continuous Glucose Sensor (DEXCOM G6 SENSOR) MISC Use to check blood sugar as directed. Dx. E11.59   cyanocobalamin (VITAMIN B12) 1000 MCG tablet Take 1,000 mcg by mouth daily.   dapagliflozin  propanediol (FARXIGA ) 10 MG TABS tablet Take 1 tablet (10 mg total) by mouth daily before breakfast.   diclofenac   Sodium (VOLTAREN ) 1 % GEL Apply 2 g topically daily as needed (muscle pain).   furosemide  (LASIX ) 40 MG tablet Take 80 mg by mouth 2 (two) times daily.   gabapentin  (NEURONTIN ) 300 MG capsule Take 600 mg by mouth 2 (two) times daily.   glucose blood (ONETOUCH VERIO) test strip Use as instructed to test blood glucose 3 times daily.   insulin  glargine, 1 Unit Dial , (TOUJEO ) 300 UNIT/ML Solostar Pen Inject  40 Units into the skin daily.   Insulin  Pen Needle (DROPLET PEN NEEDLES) 31G X 8 MM MISC USE WITH TOUJEO  SOLOSTAR PEN EVERY DAY   Lancets (ONETOUCH DELICA PLUS LANCET33G) MISC Use as instructed to test blood glucose 3 times daily.   metFORMIN  (GLUCOPHAGE -XR) 500 MG 24 hr tablet Take 2 tablets (1,000 mg total) by mouth 2 (two) times daily.   metoprolol  tartrate (LOPRESSOR ) 50 MG tablet Take 1 tablet (50 mg total) by mouth 2 (two) times daily.   Multiple Vitamin (MULTIVITAMIN WITH MINERALS) TABS tablet Take 1 tablet by mouth daily.   nitroGLYCERIN  (NITROSTAT ) 0.4 MG SL tablet Place 1 tablet (0.4 mg total) under the tongue every 5 (five) minutes as needed for chest pain.   pantoprazole  (PROTONIX ) 40 MG tablet Take 1 tablet (40 mg total) by mouth 2 (two) times daily.   Semaglutide , 2 MG/DOSE, 8 MG/3ML SOPN Inject 2 mg as directed once a week.   sildenafil  (REVATIO ) 20 MG tablet Take 1 tablet (20 mg total) by mouth 3 (three) times daily.   Vitamin D , Cholecalciferol, 25 MCG (1000 UT) TABS Take 1,000 mcg by mouth daily.   No facility-administered encounter medications on file as of 08/23/2023.    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 competitive flow   TIA (transient ischemic attack)    Type II diabetes mellitus (HCC)    Past Surgical History:  Procedure Laterality Date   CARDIAC CATHETERIZATION  2010   Previous LAD 95% lesion - now ~30-40%; patent LIMA  with competitive flow   CESAREAN SECTION  1977; 1989   CHOLECYSTECTOMY  ~ 2012   CORONARY ARTERY BYPASS GRAFT  02/2004   "CABG X1" (03/22/2013); LIMA-LAD   RIGHT HEART CATH N/A 07/14/2023   Procedure: RIGHT HEART CATH;  Surgeon: Mardell Shade, MD;  Location: [facility] INVASIVE CV LAB;  Service: Cardiovascular;  Laterality: N/A;   RIGHT/LEFT HEART CATH AND CORONARY/GRAFT ANGIOGRAPHY N/A 04/16/2021   Procedure: RIGHT/LEFT HEART CATH AND CORONARY/GRAFT ANGIOGRAPHY;  Surgeon: Lucendia Rusk, MD;  Location: [facility] INVASIVE CV LAB;  Service: Cardiovascular;  Laterality: N/A;   TUBAL LIGATION  1989   Family History  Problem Relation Age of Onset   Heart disease Mother    Diabetes Mother    Cancer Mother        cervical   Diabetes Father    Diabetes Sister  Heart disease Sister    Lupus Sister    Heart failure Sister    Hepatitis C Sister    Hypertension Daughter    Diabetes Brother    Heart attack Brother    Heart failure Brother    Diabetes Brother    Colon cancer Neg Hx    Breast cancer Neg Hx    Social History   Socioeconomic History   Marital status: Divorced    Spouse name: Not on file   Number of children: Not on file   Years of education: 12   Highest education level: Some college, no degree  Occupational History   Occupation: Disabled  Tobacco Use   Smoking status: Every Day    Current packs/day: 0.25    Average packs/day: 0.3 packs/day for 42.0 years (10.5 ttl pk-yrs)    Types: Cigarettes   Smokeless tobacco: Current   Tobacco comments:    5 Cigarettes a day. 08/22/2023  Vaping Use   Vaping status: Some Days   Substances: Flavoring  Substance and Sexual Activity   Alcohol use: Not Currently   Drug use: No    Types: Marijuana    Comment: smoked pot years ago but not currently   Sexual activity: Not Currently  Other Topics Concern   Not on file  Social History Narrative   Lives with daughter and 3 grandchildren   Social Drivers of Health   Financial  Resource Strain: Low Risk  (08/23/2023)   Overall Financial Resource Strain (CARDIA)    Difficulty of Paying Living Expenses: Not hard at all  Food Insecurity: No Food Insecurity (08/23/2023)   Hunger Vital Sign    Worried About Running Out of Food in the Last Year: Never true    Ran Out of Food in the Last Year: Never true  Transportation Needs: No Transportation Needs (08/23/2023)   PRAPARE - Administrator, Civil Service (Medical): No    Lack of Transportation (Non-Medical): No  Physical Activity: Inactive (08/23/2023)   Exercise Vital Sign    Days of Exercise per Week: 0 days    Minutes of Exercise per Session: 0 min  Stress: No Stress Concern Present (08/23/2023)   [employer] of Occupational Health - Occupational Stress Questionnaire    Feeling of Stress : Not at all  Social Connections: Unknown (08/23/2023)   Social Connection and Isolation Panel [NHANES]    Frequency of Communication with Friends and Family: More than three times a week    Frequency of Social Gatherings with Friends and Family: More than three times a week    Attends Religious Services: More than 4 times per year    Active Member of [employer] or Organizations: Yes    Attends Engineer, structural: More than 4 times per year    Marital Status: Not on file    Tobacco Counseling Ready to quit: Not Answered Counseling given: Not Answered Tobacco comments: 5 Cigarettes a day. 08/22/2023    Clinical Intake:  Pre-visit preparation completed: Yes  Pain : No/denies pain Pain Score: 0-No pain     BMI - recorded: 36.61 Nutritional Status: BMI > 30  Obese Nutritional Risks: None Diabetes: Yes CBG done?: No Did pt. bring in CBG monitor from home?: No  Lab Results  Component Value Date   HGBA1C 5.7 08/10/2023   HGBA1C 6.4 05/11/2023   HGBA1C 8.2 (A) 02/02/2023     How often do you need to have someone help you when you read instructions, pamphlets,  or other written materials from your doctor  or pharmacy?: 1 - Never  Interpreter Needed?: No  Information entered by :: Trenell Moxey N. Keontre Defino, LPN.   Activities of Daily Living     08/23/2023   10:06 AM  In your present state of health, do you have any difficulty performing the following activities:  Hearing? 0  Vision? 0  Difficulty concentrating or making decisions? 0  Comment BSE: GAMES ON PHONE, READING THE BIBLE, AUDIO BOOKS  Walking or climbing stairs? 0  Dressing or bathing? 0  Doing errands, shopping? 0  Preparing Food and eating ? N  Using the Toilet? N  In the past six months, have you accidently leaked urine? N  Do you have problems with loss of bowel control? N  Managing your Medications? N  Managing your Finances? N  Housekeeping or managing your Housekeeping? N    Patient Care Team: Joaquin Mulberry, MD as PCP - General (Family Medicine) Hugh Madura, MD as PCP - Cardiology (Cardiology) Riley Cheadle Windsor Hatcher, MD as Consulting Physician (Gastroenterology) [facility] Jearline Minder, MD as Consulting Physician (Ophthalmology)  Indicate any recent Medical Services you may have received from other than [facility] providers in the past year (date may be approximate).     Assessment:   This is a routine wellness examination for Deborah.  Hearing/Vision screen Hearing Screening - Comments:: Denies hearing difficulties.  Vision Screening - Comments:: Wears rx glasses - up to date with routine eye exams with [facility] and Dr. Lydia Sams    Goals Addressed             This Visit's Progress    Patient Stated       08/23/23: Get back into the gym and do water aerobics.       Depression Screen     08/23/2023   10:06 AM 05/11/2023    1:45 PM 11/02/2022    2:18 PM 08/19/2022   10:43 AM 05/03/2022    2:36 PM 02/04/2022    9:58 AM 01/20/2022    2:07 PM  PHQ 2/9 Scores  PHQ - 2 Score 0 4 2 0  1 0  PHQ- 9 Score 0 8 5   4    Exception Documentation     Patient refusal      Fall Risk      08/23/2023   10:05 AM 08/10/2023    1:43 PM 05/11/2023    1:45 PM 11/02/2022    2:17 PM 08/19/2022   10:42 AM  Fall Risk   Falls in the past year? 0 0 0 0 0  Number falls in past yr: 0 0 0 0 0  Injury with Fall? 0 0 0 0 0  Risk for fall due to : No Fall Risks No Fall Risks No Fall Risks No Fall Risks Medication side effect  Follow up Falls prevention discussed;Falls evaluation completed Falls evaluation completed Falls evaluation completed  Falls prevention discussed;Education provided;Falls evaluation completed    MEDICARE RISK AT HOME:  Medicare Risk at Home Any stairs in or around the home?: No If so, are there any without handrails?: No Home free of loose throw rugs in walkways, pet beds, electrical cords, etc?: Yes Adequate lighting in your home to reduce risk of falls?: Yes Life alert?: No Use of a cane, walker or w/c?: Yes Grab bars in the bathroom?: Yes Shower chair or bench in shower?: No Elevated toilet seat or a handicapped toilet?: No  TIMED UP AND GO:  Was the test performed?  No  Cognitive Function: 6CIT completed    08/23/2023   10:06 AM 06/01/2021    3:09 PM  MMSE - Mini Mental State Exam  Not completed: Unable to complete   Orientation to time  5  Orientation to Place  5  Registration  3  Attention/ Calculation  5  Recall  3  Language- name 2 objects  2  Language- repeat  1  Language- follow 3 step command  3  Language- read & follow direction  1  Write a sentence  1  Copy design  1  Total score  30        08/23/2023   10:20 AM 08/19/2022   10:44 AM  6CIT Screen  What Year? 0 points 0 points  What month? 0 points 0 points  What time? 0 points 0 points  Count back from 20 0 points 0 points  Months in reverse 0 points 0 points  Repeat phrase 0 points 2 points  Total Score 0 points 2 points    Immunizations Immunization History  Administered Date(s) Administered   Influenza,inj,Quad PF,6+ Mos 03/28/2015, 02/18/2019, 04/27/2021, 01/20/2022    Influenza-Unspecified 12/29/2022   PNEUMOCOCCAL CONJUGATE-20 04/27/2021   Pneumococcal Conjugate (Pcv15) 12/29/2022   Pneumococcal Polysaccharide-23 01/31/2018, 06/23/2018   Zoster Recombinant(Shingrix) 06/13/2019, 12/03/2022    Screening Tests Health Maintenance  Topic Date Due   COVID-19 Vaccine (1) Never done   OPHTHALMOLOGY EXAM  07/22/2023   DTaP/Tdap/Td (1 - Tdap) 08/09/2024 (Originally 04/02/1975)   Diabetic kidney evaluation - Urine ACR  11/02/2023   INFLUENZA VACCINE  11/18/2023   Lung Cancer Screening  01/13/2024   HEMOGLOBIN A1C  02/09/2024   FOOT EXAM  08/09/2024   Diabetic kidney evaluation - eGFR measurement  08/14/2024   Medicare Annual Wellness (AWV)  08/22/2024   MAMMOGRAM  10/17/2024   Colonoscopy  11/25/2026   Pneumonia Vaccine 56+ Years old  Completed   DEXA SCAN  Completed   Hepatitis C Screening  Completed   Zoster Vaccines- Shingrix  Completed   HPV VACCINES  Aged Out   Meningococcal B Vaccine  Aged Out    Health Maintenance  Health Maintenance Due  Topic Date Due   COVID-19 Vaccine (1) Never done   OPHTHALMOLOGY EXAM  07/22/2023   Health Maintenance Items Addressed: Yes Eye exam completed by N. Lydia Sams, MD.  Additional Screening:  Vision Screening: Recommended annual ophthalmology exams for early detection of glaucoma and other disorders of the eye.  Dental Screening: Recommended annual dental exams for proper oral hygiene  Community Resource Referral / Chronic Care Management: CRR required this visit?  No   CCM required this visit?  No     Plan:     I have personally reviewed and noted the following in the patient's chart:   Medical and social history Use of alcohol, tobacco or illicit drugs  Current medications and supplements including opioid prescriptions. Patient is not currently taking opioid prescriptions. Functional ability and status Nutritional status Physical activity Advanced directives List of other  physicians Hospitalizations, surgeries, and ER visits in previous 12 months Vitals Screenings to include cognitive, depression, and falls Referrals and appointments  In addition, I have reviewed and discussed with patient certain preventive protocols, quality metrics, and best practice recommendations. A written personalized care plan for preventive services as well as general preventive health recommendations were provided to patient.     Margette Sheldon, LPN   12/18/4780   After Visit Summary: (MyChart)  Due to this being a telephonic visit, the after visit summary with patients personalized plan was offered to patient via MyChart   Notes: Nothing significant to report at this time.

## 2023-08-23 NOTE — Patient Instructions (Signed)
 Ms. Deborah Coffey , Thank you for taking time to come for your Medicare Wellness Visit. I appreciate your ongoing commitment to your health goals. Please review the following plan we discussed and let me know if I can assist you in the future.   Referrals/Orders/Follow-Ups/Clinician Recommendations: Yes, keep maintaining your health by keeping your appointments with Dr.  Newlin and any specialists that you may see.  Call us  if you need anything.  Have a great year!!!!  This is a list of the screening recommended for you and due dates:  Health Maintenance  Topic Date Due   COVID-19 Vaccine (1) Never done   Eye exam for diabetics  07/22/2023   DTaP/Tdap/Td vaccine (1 - Tdap) 08/09/2024*   Yearly kidney health urinalysis for diabetes  11/02/2023   Flu Shot  11/18/2023   Screening for Lung Cancer  01/13/2024   Hemoglobin A1C  02/09/2024   Complete foot exam   08/09/2024   Yearly kidney function blood test for diabetes  08/14/2024   Medicare Annual Wellness Visit  08/22/2024   Mammogram  10/17/2024   Colon Cancer Screening  11/25/2026   Pneumonia Vaccine  Completed   DEXA scan (bone density measurement)  Completed   Hepatitis C Screening  Completed   Zoster (Shingles) Vaccine  Completed   HPV Vaccine  Aged Out   Meningitis B Vaccine  Aged Out  *Topic was postponed. The date shown is not the original due date.    Advanced directives: (Declined) Advance directive discussed with you today. Even though you declined this today, please call our office should you change your mind, and we can give you the proper paperwork for you to fill out.  Next Medicare Annual Wellness Visit scheduled for next year: Yes, 08/28/2024 at 9:50 a.m. phone visit with Nurse Health Advisor  Have you seen your provider in the last 6 months (3 months if uncontrolled diabetes)? Yes, 02/08/2024 at 2:10 p.m. with Dr. Newlin

## 2023-08-29 ENCOUNTER — Other Ambulatory Visit (HOSPITAL_COMMUNITY)

## 2023-08-30 ENCOUNTER — Ambulatory Visit (HOSPITAL_COMMUNITY): Payer: Self-pay

## 2023-09-05 ENCOUNTER — Ambulatory Visit (HOSPITAL_COMMUNITY): Payer: Self-pay | Admitting: Cardiology

## 2023-09-05 ENCOUNTER — Ambulatory Visit (HOSPITAL_COMMUNITY)
Admission: RE | Admit: 2023-09-05 | Discharge: 2023-09-05 | Disposition: A | Discharge status: Home / Self Care | Source: Ambulatory Visit | Attending: Cardiology | Admitting: Cardiology

## 2023-09-05 DIAGNOSIS — I5032 Chronic diastolic (congestive) heart failure: Secondary | ICD-10-CM | POA: Diagnosis not present

## 2023-09-05 LAB — BASIC METABOLIC PANEL WITH GFR
Anion gap: 13 (ref 5–15)
BUN: 31 mg/dL — ABNORMAL HIGH (ref 8–23)
CO2: 25 mmol/L (ref 22–32)
Calcium: 9.8 mg/dL (ref 8.9–10.3)
Chloride: 104 mmol/L (ref 98–111)
Creatinine, Ser: 2.06 mg/dL — ABNORMAL HIGH (ref 0.44–1.00)
GFR, Estimated: 26 mL/min — ABNORMAL LOW (ref >60)
Glucose, Bld: 76 mg/dL (ref 70–99)
Potassium: 4.2 mmol/L (ref 3.5–5.1)
Sodium: 142 mmol/L (ref 135–145)

## 2023-09-20 ENCOUNTER — Telehealth: Payer: Self-pay | Admitting: Acute Care

## 2023-09-20 NOTE — Telephone Encounter (Signed)
 Patient was having LDCT ordered by Dr. Thelda Finney, per pt's chart she has not had SDMV. Patient is due for annual LDCT but will still need a SDMV prior to her annual. Called and left VM for pt to schedule her SDVM and annual LDCT for 12/2023.

## 2023-10-10 ENCOUNTER — Telehealth (HOSPITAL_COMMUNITY): Payer: Self-pay | Admitting: Internal Medicine

## 2023-10-10 NOTE — Telephone Encounter (Signed)
 Called to confirm/remind patient of their appointment at [facility] on 10/10/23.   Appointment:   [x] Confirmed  [] Left mess   [] No answer/No voice mail  [] VM Full/unable to leave message  [] Phone not in service  Patient reminded to bring all medications and/or complete list.  Confirmed patient has transportation. Gave directions, instructed to utilize valet parking.

## 2023-10-11 ENCOUNTER — Encounter (HOSPITAL_COMMUNITY): Payer: Self-pay | Admitting: Internal Medicine

## 2023-10-11 ENCOUNTER — Ambulatory Visit (HOSPITAL_COMMUNITY)
Admission: RE | Admit: 2023-10-11 | Discharge: 2023-10-11 | Disposition: A | Discharge status: Home / Self Care | Source: Ambulatory Visit | Attending: Internal Medicine | Admitting: Internal Medicine

## 2023-10-11 ENCOUNTER — Telehealth (HOSPITAL_COMMUNITY): Payer: Self-pay

## 2023-10-11 ENCOUNTER — Other Ambulatory Visit (HOSPITAL_COMMUNITY): Payer: Self-pay

## 2023-10-11 VITALS — BP 122/70 | HR 83 | Ht 65.0 in | Wt 219.6 lb

## 2023-10-11 DIAGNOSIS — I272 Pulmonary hypertension, unspecified: Secondary | ICD-10-CM | POA: Diagnosis not present

## 2023-10-11 DIAGNOSIS — D631 Anemia in chronic kidney disease: Secondary | ICD-10-CM | POA: Insufficient documentation

## 2023-10-11 DIAGNOSIS — I2510 Atherosclerotic heart disease of native coronary artery without angina pectoris: Secondary | ICD-10-CM | POA: Diagnosis not present

## 2023-10-11 DIAGNOSIS — I2729 Other secondary pulmonary hypertension: Secondary | ICD-10-CM | POA: Diagnosis not present

## 2023-10-11 DIAGNOSIS — E785 Hyperlipidemia, unspecified: Secondary | ICD-10-CM | POA: Insufficient documentation

## 2023-10-11 DIAGNOSIS — I5032 Chronic diastolic (congestive) heart failure: Secondary | ICD-10-CM | POA: Insufficient documentation

## 2023-10-11 DIAGNOSIS — G4733 Obstructive sleep apnea (adult) (pediatric): Secondary | ICD-10-CM | POA: Diagnosis not present

## 2023-10-11 DIAGNOSIS — Z794 Long term (current) use of insulin: Secondary | ICD-10-CM | POA: Insufficient documentation

## 2023-10-11 DIAGNOSIS — Z8673 Personal history of transient ischemic attack (TIA), and cerebral infarction without residual deficits: Secondary | ICD-10-CM | POA: Insufficient documentation

## 2023-10-11 DIAGNOSIS — I129 Hypertensive chronic kidney disease with stage 1 through stage 4 chronic kidney disease, or unspecified chronic kidney disease: Secondary | ICD-10-CM | POA: Diagnosis not present

## 2023-10-11 DIAGNOSIS — Z6836 Body mass index (BMI) 36.0-36.9, adult: Secondary | ICD-10-CM | POA: Diagnosis not present

## 2023-10-11 DIAGNOSIS — Z7985 Long-term (current) use of injectable non-insulin antidiabetic drugs: Secondary | ICD-10-CM | POA: Diagnosis not present

## 2023-10-11 DIAGNOSIS — F17210 Nicotine dependence, cigarettes, uncomplicated: Secondary | ICD-10-CM | POA: Diagnosis not present

## 2023-10-11 DIAGNOSIS — Z79899 Other long term (current) drug therapy: Secondary | ICD-10-CM | POA: Insufficient documentation

## 2023-10-11 DIAGNOSIS — Z720 Tobacco use: Secondary | ICD-10-CM | POA: Diagnosis not present

## 2023-10-11 DIAGNOSIS — I130 Hypertensive heart and chronic kidney disease with heart failure and stage 1 through stage 4 chronic kidney disease, or unspecified chronic kidney disease: Secondary | ICD-10-CM | POA: Insufficient documentation

## 2023-10-11 DIAGNOSIS — Z7984 Long term (current) use of oral hypoglycemic drugs: Secondary | ICD-10-CM | POA: Insufficient documentation

## 2023-10-11 DIAGNOSIS — I50812 Chronic right heart failure: Secondary | ICD-10-CM | POA: Insufficient documentation

## 2023-10-11 DIAGNOSIS — F32A Depression, unspecified: Secondary | ICD-10-CM | POA: Diagnosis not present

## 2023-10-11 DIAGNOSIS — I10 Essential (primary) hypertension: Secondary | ICD-10-CM | POA: Diagnosis not present

## 2023-10-11 DIAGNOSIS — E1122 Type 2 diabetes mellitus with diabetic chronic kidney disease: Secondary | ICD-10-CM | POA: Insufficient documentation

## 2023-10-11 DIAGNOSIS — Z951 Presence of aortocoronary bypass graft: Secondary | ICD-10-CM | POA: Diagnosis not present

## 2023-10-11 DIAGNOSIS — N1831 Chronic kidney disease, stage 3a: Secondary | ICD-10-CM | POA: Diagnosis not present

## 2023-10-11 DIAGNOSIS — I2781 Cor pulmonale (chronic): Secondary | ICD-10-CM | POA: Insufficient documentation

## 2023-10-11 DIAGNOSIS — E669 Obesity, unspecified: Secondary | ICD-10-CM | POA: Diagnosis not present

## 2023-10-11 MED ORDER — METOPROLOL TARTRATE 50 MG PO TABS
50.0000 mg | ORAL_TABLET | Freq: Every day | ORAL | 5 refills | Status: DC
Start: 2023-10-11 — End: 2023-12-15

## 2023-10-11 NOTE — Patient Instructions (Addendum)
 Medication Changes:  WHEN YOU START THE OPSYNVI--- STOP THE SILDENAFIL    YOU WILL TAKE OPSYNVI 10/20MG  ONCE DAILY FOR 1 WEEK   THEN INCREASE TO 10/40MG  THEREAFTER-  PHARMACY WILL CALL YOU WITH MORE INFORMATION REGARDING THIS--- IT WILL COME FROM A SPECIALTY PHARMACY  DECREASE THE METOPROLOL  TO 50MG  ONCE DAILY   Testing/Procedures:  OVERNIGHT OXIMETRY--- THEY WILL REACH OUT WITH THE EQUIPMENT AND INSTRUCTIONS FOR THIS  Follow-Up in: 3 MONTHS WITH ECHO AS SCHEDULED   At the [facility], you and your health needs are our priority. We have a designated team specialized in the treatment of Heart Failure. This Care Team includes your primary Heart Failure Specialized Cardiologist (physician), Advanced Practice Providers (APPs- Physician Assistants and Nurse Practitioners), and Pharmacist who all work together to provide you with the care you need, when you need it.   You may see any of the following providers on your designated Care Team at your next follow up:  Dr. Toribio Fuel Dr. Ezra Shuck Dr. Ria Commander Dr. Odis Brownie Greig Mosses, NP Caffie Shed, [place] [facility], [place] Beckey Coe, NP [place] Lee, NP Tinnie Redman, PharmD   Please be sure to bring in all your medications bottles to every appointment.   Need to Contact Us :  If you have any questions or concerns before your next appointment please send us  a message through [place] or call our office at [phone].    TO LEAVE A MESSAGE FOR THE NURSE SELECT OPTION 2, PLEASE LEAVE A MESSAGE INCLUDING: YOUR NAME DATE OF BIRTH CALL BACK NUMBER REASON FOR CALL**this is important as we prioritize the call backs  YOU WILL RECEIVE A CALL BACK THE SAME DAY AS LONG AS YOU CALL BEFORE 4:00 PM

## 2023-10-11 NOTE — Progress Notes (Signed)
 ADVANCED HF CLINIC NOTE  PCP: [name], MD Primary Cardiologist: Oneil Parchment, MD HF Cardiologist: Dr. Cherrie  CC: HF follow up  HPI: Deborah Coffey is a 68 y.o. with CAD s/p CABG x1 (LIMA-LAD) 2005, tobacco use, chronic diastolic CHF, anemia, depression, HLD, HTN, DM, CKD stage 3b, prior TIAs, pulmonary HTN (felt due to underlying lung disease/smoking), and morbid obesity.    Echo12/22 EF 65-70% RV moderately dilated with moderate HK. Flattened septum Moderate TR RVSP est 110 mmHG    Chest CT scan 2/22. No PE   R/L cath on 04/16/21: - mLAD 75% LIMA to LAD is widely patent. pRCA 50% - EF 65% - RA 5 PA 79/18 (39) PCW 12  CO 6.4 L/min; CI PVR 4.2  Sleep study 06/2021 only c/w mild OSA AHI 5.5   Echo 11/24 EF 60-65%, IVC flattened in systole and diastole, c/w RV pressure and volume overload.  RV severely enlarged w/ severely reduced systolic function. The estimated RVSP is 92.5 mmHg (comparred to 111 mmHg previous study). RA severely dilated. Moderate TR    PFTs 12/24: FEV1 1.27 (50%) FVC 1.84 (55),ratio 77, DLCOcor 48. Moderately severe restriction with reversibility (12% change) and severe diffusion defect. She is being followed by [facility] (Dr. Brenna). He has also being following her for pulmonary nodules.   Echo 11/24 EF 60-65% RV mod reduced RVSP 92   RHC 3/25 demonstrated severe PAH w/ cor pulmonale, suspect Group 1 and possibly Group 3. She was started on Sildenafil  20 tid.  RA = 4, RV = 77/6, PA =  78/19 (42), PCW = 6, Fick CO/CI = 3.9/1.9, Thermo CO/CI = 3.6/1.7, PVR = 9.3 (Fick) 10.0 (TD), Ao sat = 96%, PA sat = 57%, 58%, PAPi = 14.8   Today she returns for AHF follow up with her granddaughter. Overall feeling well still c/o right foot pain thinks it may be gout. Denies CP. Denies SOB, edema, orthopnea or PND. Does all ADLs without difficulty (as long as my foot is ok) Still smoking 1/2 ppd.   Cardiac Studies: - Echo 11/24 EF 60-65%, IVC flattened  in systole and diastole, c/w RV pressure and volume overload,  RV severely enlarged w/ severely reduced systolic function.  - PFTs 2/23 FEV1 1.27 (61%) FVC 1.63 (61%) DLCO 43%  - R/LHC (12/22): EF 65%, mLAD 75% LIMA to LAD is widely patent. pRCA 50; RA 5 PA 79/18 (39) PCW 12  CO 6.4 L/min; CI PVR 4.2  - Echo (12/22): EF 65-70% RV moderately dilated with moderate HK. Flattened septum Moderate TR RVSP est 110mmHG    - Chest CT scan 2/22. No PE  Past Medical History:  Diagnosis Date   Allergy    Anemia    CAD in native artery 02/18/2004   Mid LAD lesoin just after Large D1 --> CABG x 1 LIMA-LAD   CAP (community acquired pneumonia) 08/07/2014   Chronic kidney disease, stage 3b (HCC)    Depression    High cholesterol    History of blood transfusion 04/20/2003   related to OHS    Hypertension    Lung nodules    Mild carotid artery disease (HCC)    Pulmonary hypertension (HCC)    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 competitive flow   TIA (transient ischemic attack)    Type II diabetes mellitus (HCC)    Current Outpatient Medications  Medication Sig Dispense Refill   acetaminophen  (TYLENOL )  500 MG tablet Take 1,000 mg by mouth every 6 (six) hours as needed for headache or moderate pain (pain score 4-6).     Alcohol Swabs (B-D SINGLE USE SWABS REGULAR) PADS USE AS DIRECTED 100 each 0   aspirin  EC 81 MG tablet Take 81 mg by mouth daily.     atorvastatin  (LIPITOR) 10 MG tablet Take 1 tablet (10 mg total) by mouth once daily. 90 tablet 1   Blood Glucose Monitoring Suppl (ONETOUCH VERIO) w/Device KIT Use as instructed to test blood glucose 3 times daily. 1 kit 0   Continuous Glucose Sensor (DEXCOM G6 SENSOR) MISC Use to check blood sugar as directed. Dx. E11.59 3 each 11   cyanocobalamin (VITAMIN B12) 1000 MCG tablet Take 1,000 mcg by mouth daily.     dapagliflozin  propanediol (FARXIGA ) 10 MG TABS tablet Take 1 tablet (10 mg total)  by mouth daily before breakfast. 90 tablet 1   diclofenac  Sodium (VOLTAREN ) 1 % GEL Apply 2 g topically daily as needed (muscle pain). 150 g 1   furosemide  (LASIX ) 40 MG tablet Take 60 mg by mouth 2 (two) times daily.     gabapentin  (NEURONTIN ) 300 MG capsule Take 600 mg by mouth 2 (two) times daily.     glucose blood (ONETOUCH VERIO) test strip Use as instructed to test blood glucose 3 times daily. 100 each 3   insulin  glargine, 1 Unit Dial , (TOUJEO ) 300 UNIT/ML Solostar Pen Inject 40 Units into the skin daily.     Insulin  Pen Needle (DROPLET PEN NEEDLES) 31G X 8 MM MISC USE WITH TOUJEO  SOLOSTAR PEN EVERY DAY 100 each 0   Lancets (ONETOUCH DELICA PLUS LANCET33G) MISC Use as instructed to test blood glucose 3 times daily. 100 each 3   metFORMIN  (GLUCOPHAGE -XR) 500 MG 24 hr tablet Take 2 tablets (1,000 mg total) by mouth 2 (two) times daily. 360 tablet 1   metoprolol  tartrate (LOPRESSOR ) 50 MG tablet Take 1 tablet (50 mg total) by mouth 2 (two) times daily. 180 tablet 1   Multiple Vitamin (MULTIVITAMIN WITH MINERALS) TABS tablet Take 1 tablet by mouth daily.     nitroGLYCERIN  (NITROSTAT ) 0.4 MG SL tablet Place 1 tablet (0.4 mg total) under the tongue every 5 (five) minutes as needed for chest pain. 25 tablet 0   pantoprazole  (PROTONIX ) 40 MG tablet Take 1 tablet (40 mg total) by mouth 2 (two) times daily. 180 tablet 1   Semaglutide , 2 MG/DOSE, 8 MG/3ML SOPN Inject 2 mg as directed once a week. 6 mL 1   sildenafil  (REVATIO ) 20 MG tablet Take 1 tablet (20 mg total) by mouth 3 (three) times daily. 270 tablet 3   Vitamin D , Cholecalciferol, 25 MCG (1000 UT) TABS Take 1,000 mcg by mouth daily.     No current facility-administered medications for this encounter.   Allergies  Allergen Reactions   Ciprofloxacin Rash   Social History   Socioeconomic History   Marital status: Divorced    Spouse name: Not on file   Number of children: Not on file   Years of education: 12   Highest education level:  Some college, no degree  Occupational History   Occupation: Disabled  Tobacco Use   Smoking status: Every Day    Current packs/day: 0.25    Average packs/day: 0.3 packs/day for 42.0 years (10.5 ttl pk-yrs)    Types: Cigarettes   Smokeless tobacco: Current   Tobacco comments:    5 Cigarettes a day. 08/22/2023  Vaping Use  Vaping status: Some Days   Substances: Flavoring  Substance and Sexual Activity   Alcohol use: Not Currently   Drug use: No    Types: Marijuana    Comment: smoked pot years ago but not currently   Sexual activity: Not Currently  Other Topics Concern   Not on file  Social History Narrative   Lives with daughter and 3 grandchildren   Social Drivers of Health   Financial Resource Strain: Low Risk  (08/23/2023)   Overall Financial Resource Strain (CARDIA)    Difficulty of Paying Living Expenses: Not hard at all  Food Insecurity: No Food Insecurity (08/23/2023)   Hunger Vital Sign    Worried About Running Out of Food in the Last Year: Never true    Ran Out of Food in the Last Year: Never true  Transportation Needs: No Transportation Needs (08/23/2023)   PRAPARE - Administrator, Civil Service (Medical): No    Lack of Transportation (Non-Medical): No  Physical Activity: Inactive (08/23/2023)   Exercise Vital Sign    Days of Exercise per Week: 0 days    Minutes of Exercise per Session: 0 min  Stress: No Stress Concern Present (08/23/2023)   [employer] of Occupational Health - Occupational Stress Questionnaire    Feeling of Stress : Not at all  Social Connections: Unknown (08/23/2023)   Social Connection and Isolation Panel    Frequency of Communication with Friends and Family: More than three times a week    Frequency of Social Gatherings with Friends and Family: More than three times a week    Attends Religious Services: More than 4 times per year    Active Member of [employer] or Organizations: Yes    Attends Banker Meetings: More than 4  times per year    Marital Status: Not on file  Intimate Partner Violence: Not At Risk (08/23/2023)   Humiliation, Afraid, Rape, and Kick questionnaire    Fear of Current or Ex-Partner: No    Emotionally Abused: No    Physically Abused: No    Sexually Abused: No   Family History  Problem Relation Age of Onset   Heart disease Mother    Diabetes Mother    Cancer Mother        cervical   Diabetes Father    Diabetes Sister    Heart disease Sister    Lupus Sister    Heart failure Sister    Hepatitis C Sister    Hypertension Daughter    Diabetes Brother    Heart attack Brother    Heart failure Brother    Diabetes Brother    Colon cancer Neg Hx    Breast cancer Neg Hx    BP 122/70   Pulse 83   Ht 5' 5 (1.651 m)   Wt 99.6 kg (219 lb 9.6 oz)   LMP  (LMP Unknown)   SpO2 97%   BMI 36.54 kg/m   Wt Readings from Last 3 Encounters:  10/11/23 99.6 kg (219 lb 9.6 oz)  08/23/23 99.8 kg (220 lb)  08/22/23 100.8 kg (222 lb 3.2 oz)   PHYSICAL EXAM: General:  obese woman No resp difficulty HEENT: normal Neck: supple. no JVD. Carotids 2+ bilat; no bruits. No lymphadenopathy or thryomegaly appreciated. Cor: PMI nondisplaced. Regular rate & rhythm. No rubs, gallops or murmurs. Lungs: decreased throughout Abdomen: obese soft, nontender, nondistended. No hepatosplenomegaly. No bruits or masses. Good bowel sounds. Extremities: no cyanosis, clubbing, rash, edema Neuro: alert &  orientedx3, cranial nerves grossly intact. moves all 4 extremities w/o difficulty. Affect pleasant   ASSESSMENT & PLAN:  1. Pulmonary HTN with RV failure/cor pulmonale - Echo (12/22): EF 65-70% RV moderately dilated with moderate HK, flattened septum, moderate TR, RVSP est 110 mmHG   - Chest CT scan (2/22): No PE - RHC (12/22): RA 5 PA 79/18 (39) PCW 12  CO 6.4 L/min; CI PVR 4.2 - PFTs with significant restrictive/obstructive lung disease - ANA, ANCA, [place]-70, RF all negative. - Sleep study with mild OSA.  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 She was started on Sildenafil  20 tid  - Stable NYHA II-III - Switch sildenafil  to Opsynvi  - Repeat echo and RHC in 3 months  - Continue Lasix  80 mg bid. - Continue Farxiga  10 mg daily. - Wean metoprolol  to 50 daily. Stop at next visit - Continue to feel that she is high risk for OSA and nocturnal hypoxia. Previous sleep study with just mild OSE. Place ONOX  2. CAD - CABG 2005 LIMA-> LAD - Cath (04/16/21): mLAD 75% LIMA to LAD is widely patent. pRCA 50 - No s/s angina - Continue ASA/statin. - Management per Dr. Jeffrie.  3. Morbid obesity - Body mass index is 36.54 kg/m.. - On ozempic   4. OSA - Mild on sleep study. Not on CPAP - Has follow up with Pulm - Pending ONOX  5. HTN - BP ok   6. Smoking - Discussed cessation   Jamirra Curnow, MD  10:04 AM

## 2023-10-11 NOTE — Telephone Encounter (Signed)
 Advanced Heart Failure Patient Advocate Encounter  Prior authorization for Opsynvi has been submitted and approved. Test billing returns $0 for 30 day supply.  Key: [id number] Effective: 04/20/2023 to 04/18/2024  Rachel DEL, CPhT Rx Patient Advocate Phone: [phone]

## 2023-10-12 ENCOUNTER — Other Ambulatory Visit (HOSPITAL_COMMUNITY): Payer: Self-pay

## 2023-10-13 ENCOUNTER — Telehealth (HOSPITAL_COMMUNITY): Payer: Self-pay

## 2023-10-13 NOTE — Telephone Encounter (Signed)
 Spoke to patient by phone, obtained consent to sign and send enrollment forms for Opsynvi. Confirmed address and contact information for patient, advised pt to be expecting further calls to arrange delivery. Opsynvi enrollment form faxed 10/13/2023

## 2023-10-13 NOTE — Telephone Encounter (Signed)
 Advanced Heart Failure Patient Advocate Encounter  Prior auth for Opsynvi has been approved. Attempted to contact patient about submitting enrollment form. Someone answered and stated the patient is not available and disconnected. Will continue attempts to contact patient.  Rachel DEL, CPhT Rx Patient Advocate Phone: [phone]

## 2023-10-15 ENCOUNTER — Other Ambulatory Visit: Payer: Self-pay | Admitting: Family Medicine

## 2023-10-15 DIAGNOSIS — E785 Hyperlipidemia, unspecified: Secondary | ICD-10-CM

## 2023-10-15 DIAGNOSIS — E1122 Type 2 diabetes mellitus with diabetic chronic kidney disease: Secondary | ICD-10-CM

## 2023-10-15 DIAGNOSIS — I10 Essential (primary) hypertension: Secondary | ICD-10-CM

## 2023-10-15 DIAGNOSIS — Z794 Long term (current) use of insulin: Secondary | ICD-10-CM

## 2023-10-18 ENCOUNTER — Telehealth: Payer: Self-pay

## 2023-10-18 NOTE — Telephone Encounter (Signed)
 The pt called and provided three patient identifiers for herself. The pt states she had been called from this phone number and was returning the call. I informed the patient I would look further and notify her if I saw anything we needed to call her about. I sent her chart to the call back nurse, Alfonso Bailey RN, and neither of us  could see any recent urgent care visits or phone calls.

## 2023-10-20 ENCOUNTER — Encounter: Payer: Self-pay | Admitting: Nurse Practitioner

## 2023-10-20 ENCOUNTER — Ambulatory Visit: Admitting: Nurse Practitioner

## 2023-10-20 VITALS — BP 134/62 | HR 89 | Ht 64.0 in | Wt 221.2 lb

## 2023-10-20 DIAGNOSIS — I272 Pulmonary hypertension, unspecified: Secondary | ICD-10-CM | POA: Diagnosis not present

## 2023-10-20 DIAGNOSIS — Z720 Tobacco use: Secondary | ICD-10-CM

## 2023-10-20 DIAGNOSIS — F17210 Nicotine dependence, cigarettes, uncomplicated: Secondary | ICD-10-CM | POA: Diagnosis not present

## 2023-10-20 DIAGNOSIS — J984 Other disorders of lung: Secondary | ICD-10-CM

## 2023-10-20 DIAGNOSIS — J84115 Respiratory bronchiolitis interstitial lung disease: Secondary | ICD-10-CM

## 2023-10-20 DIAGNOSIS — R911 Solitary pulmonary nodule: Secondary | ICD-10-CM

## 2023-10-20 NOTE — Assessment & Plan Note (Signed)
 Likely multifactorial. Volume status stable with improved dyspnea. Encouraged to work on healthy weight loss and smoking cessation. Reversibility on PFTs, consistent with possibility of asthma. No significant change with SABA in the past. Never tried ICS/LABA. No respiratory symptoms reported today. Continue to monitor.   Patient Instructions  Continue Albuterol  inhaler 2 puffs every 6 hours as needed for shortness of breath or wheezing. Notify if symptoms persist despite rescue inhaler/neb use.    Start Wellbutrin  - 1 tab (150 mg) daily for 3 days then increase to 1 tab twice daily. Notify of any mood changes immediately. Do not drink alcohol while you are on this. This should help with quitting smoking. Gradually cut back with aim quit date in 4-6 weeks  Monitor your blood pressure closely while on the wellbutrin  (bupropion ) and taking metoprolol . If you're noticing blood pressures <100/60 or getting dizzy/lightheaded, notify us  or Dr. Newlin immediately so we can address this and adjust medications if needed    Follow up with Dr. Bensimhon as scheduled Monitor your weights and notify if you gain 2-3 lb overnight or 5 lb in a week  Glad you are doing well!    Follow up in 6-8 weeks with Dr. Annella (new pt slot) or Katie Tanner Yeley,NP. If symptoms do not improve or worsen, please contact office for sooner follow up or seek emergency care.

## 2023-10-20 NOTE — Assessment & Plan Note (Signed)
 Lung RADS 2. Due Sept 2025

## 2023-10-20 NOTE — Assessment & Plan Note (Signed)
 Smoking cessation strongly advised. Advised to start the Wellbutrin  for smoking cessation. Side effect profile reviewed. Understands proper use of medication and to monitor for any mood changes.    The patient's current tobacco use: 1/4 ppd The patient was advised to quit and impact of smoking on their health.  I assessed the patient's willingness to attempt to quit. I provided methods and skills for cessation. We reviewed medication management of smoking session drugs if appropriate. Resources to help quit smoking were provided. A smoking cessation quit date was set: 09/22/2023 Follow-up was arranged in our clinic.  The amount of time spent counseling patient was 5 mins

## 2023-10-20 NOTE — Assessment & Plan Note (Signed)
 Smoking cessation advised.

## 2023-10-20 NOTE — Assessment & Plan Note (Signed)
 Severely elevated pulmonary artery pressures on RHC. Group II/III. Prior sleep study with very mild OSA. Euvolemic on exam today. Compliant with sildenafil , lasix , and farxiga . Plan to switch to Opsynvi. Follow up with Dr. Bensimhon as scheduled.

## 2023-10-20 NOTE — Progress Notes (Signed)
 @Patient  ID: Deborah Coffey, female    DOB: 10-26-1955, 68 y.o.   MRN: [id number]  Chief Complaint  Patient presents with   Follow-up    Referring provider: [name], MD  HPI: 68 year old female, active smoker followed for lung nodules and restrictive lung disease. She is a patient of Dr. Harriet and last seen in office 08/22/2023 by [facility] NP. Past medical history significant for CHF, PH, HTN, CAD s/p CABG, DM insulin  dependent, eczema, anxiety, depression, HLD, hx of TIA.  TEST/EVENTS:  04/16/2021 cardiac cath: no significant obstructive disease. LV function nl. Moderate pulm HTN.  01/13/2023 CT chest nodule follow-up: Mild cardiomegaly with prior CABG.  Pulmonary artery enlargement.  Mild centrilobular emphysema.  Diffuse upper lobe predominant ill-defined micro nodularity, smoking-related respiratory bronchiolitis.  Right upper lobe pulmonary nodule on prior exam has resolved.  Other pulmonary nodules not significantly changed, up to 4.3 mm.  Lung RADS 2. 02/25/2023 echo: EF 60-65%. Interventricular septum flattening, consistent with volume overload. RV function moderately reduced and severely enlarged. Severely elevated PASP. RA severely dilated. Moderate TR. PR moderate.  03/21/2023 PFT: FVC 55, FEV1 50, ratio 77, TLC 70, DLCOcor 48. Moderately severe restriction with reversibility (12% change) and severe diffusing defect   12/29/2022: OV with Dr. Brenna. Former smoker with small pack year hx. CT chest lung nodule follow up that was completed 06/23/2022. Referred for evaluation having waxing and waning nodules and a new solid nodule in RUL, measuring 5.4 mm. Repeat CT chest ordered. Heavily counseled on smoking cessation. Given albuterol  to trial for DOE. Will obtain PFT.   03/23/2023: OV with Elasia Furnish NP for follow up after PFT which revealed a moderate restrictive defect with moderately severe diffusing defect. She did have some reversibility in her lung function following administration  of bronchodilators; however, no formal obstruction based on FEV1/FVC ratio >70. She has used the albuterol  but it is infrequent. Maybe 1-2 times a month. She's not sure if it helps or not. She does get winded when she first starts walking but once she's up and moving, she tends to feel better. She still has to rest after longer distances or uphill climbing. She does not currently have a cough but tends to get one with changes of the season, especially the fall and spring. She does have allergy symptoms, especially in the fall, with sinus congestion/drainage. She does not take a daily allergy pill. She does not have a history of asthma but has had issues with bronchitis in the past. She has not required any treatment recently for her breathing or cough with steroids or antibiotics. She denies fevers, chills, wheezing, chest congestion, hemoptysis.  She had an enlarged pulmonary artery on her CT scan and evidence of smoking related respiratory bronchiolitis. No IPF. She is an active smoker; 6 cigarettes a day. She does have a known history of pulmonary hypertension and CHF. She is followed by Dr. Bensimhon but has not been seen in quite some time. She had a sleep study in 2023, which revealed very mild sleep apnea; unlikely cause of her PH. She does have some occasional leg swelling, usually in her ankles. She has not noticed any recent weight gain at home but dry weight is usually 230 lb. No orthopnea, PND, CP.   08/22/2023: OV with Kiylah Loyer NP for overdue follow up. At her last visit, she had PFTs which showed a restriction in lung function felt to be secondary to volume overload/body habitus. No formal obstruction but did have a some  smoking related bronchiolitis and emphysema on prior imaging. We started Symbicort ; however, she tells me she never used this. She did get back in with Dr. Bensimhon, who adjusted her HF/PH medications. She also had a right heart catheterization that revealed severe PH. Since then, her  breathing has been doing well she feels. She's not having any issues with cough, chest congestion, wheezing. No leg swelling, orthopnea, PND, CP, palpitations. She's not using any inhalers right now.  She is still smoking a couple of cigarettes a day. She never started the Wellbutrin  after our last visit. She does want help quitting smoking. She is willing to retry the Wellbutrin . Her depression has been stable. No SI/HI.   10/20/2023: Today - follow up Discussed the use of AI scribe software for clinical note transcription with the patient, who gave verbal consent to proceed.  History of Present Illness   Deborah Coffey is a 68 year old female who presents for follow up.  She is currently managing pulmonary hypertension with sildenafil , taken three times daily. Recently, her cardiologist prescribed a Opsynvi, but she has not yet received it due to processing delays.  Breathing feels stable. No issues with cough. No significant leg swelling or weight gain.   She has not started Wellbutrin  yet but she is planning to for smoking cessation, as she is concerned about smoking while on her new medication regimen. No current issues with breathing.       Allergies  Allergen Reactions   Ciprofloxacin Rash    Immunization History  Administered Date(s) Administered   Influenza,inj,Quad PF,6+ Mos 03/28/2015, 02/18/2019, 04/27/2021, 01/20/2022   Influenza-Unspecified 12/29/2022   PNEUMOCOCCAL CONJUGATE-20 04/27/2021   Pneumococcal Conjugate (Pcv15) 12/29/2022   Pneumococcal Polysaccharide-23 01/31/2018, 06/23/2018   Zoster Recombinant(Shingrix) 06/13/2019, 12/03/2022    Past Medical History:  Diagnosis Date   Allergy    Anemia    CAD in native artery 02/18/2004   Mid LAD lesoin just after Large D1 --> CABG x 1 LIMA-LAD   CAP (community acquired pneumonia) 08/07/2014   Chronic kidney disease, stage 3b (HCC)    Depression    High cholesterol    History of blood transfusion 04/20/2003    related to [facility]    Hypertension    Lung nodules    Mild carotid artery disease (HCC)    Pulmonary hypertension (HCC)    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 competitive flow   TIA (transient ischemic attack)    Type II diabetes mellitus (HCC)     Tobacco History: Social History   Tobacco Use  Smoking Status Every Day   Current packs/day: 0.25   Average packs/day: 0.3 packs/day for 42.0 years (10.5 ttl pk-yrs)   Types: Cigarettes  Smokeless Tobacco Current  Tobacco Comments   10 Cigarettes a day. 08/22/2023   Ready to quit: Not Answered Counseling given: Not Answered Tobacco comments: 10 Cigarettes a day. 08/22/2023   Outpatient Medications Prior to Visit  Medication Sig Dispense Refill   acetaminophen  (TYLENOL ) 500 MG tablet Take 1,000 mg by mouth every 6 (six) hours as needed for headache or moderate pain (pain score 4-6).     Alcohol Swabs (B-D SINGLE USE SWABS REGULAR) PADS USE AS DIRECTED 100 each 0   aspirin  EC 81 MG tablet Take 81 mg by mouth daily.     atorvastatin  (LIPITOR) 10 MG tablet TAKE 1 TABLET ONE TIME DAILY 90 tablet 1   Blood Glucose Monitoring Suppl (  ONETOUCH VERIO) w/Device KIT Use as instructed to test blood glucose 3 times daily. 1 kit 0   Continuous Glucose Sensor (DEXCOM G6 SENSOR) MISC Use to check blood sugar as directed. Dx. E11.59 3 each 11   cyanocobalamin (VITAMIN B12) 1000 MCG tablet Take 1,000 mcg by mouth daily.     dapagliflozin  propanediol (FARXIGA ) 10 MG TABS tablet TAKE 1 TABLET EVERY DAY BEFORE BREAKFAST 90 tablet 1   diclofenac  Sodium (VOLTAREN ) 1 % GEL Apply 2 g topically daily as needed (muscle pain). 150 g 1   furosemide  (LASIX ) 40 MG tablet Take 60 mg by mouth 2 (two) times daily.     gabapentin  (NEURONTIN ) 300 MG capsule Take 600 mg by mouth 2 (two) times daily.     glucose blood (ONETOUCH VERIO) test strip Use as instructed to test blood glucose 3 times daily. 100 each 3   Insulin   Pen Needle (DROPLET PEN NEEDLES) 31G X 8 MM MISC USE WITH TOUJEO  SOLOSTAR PEN EVERY DAY 100 each 0   Lancets (ONETOUCH DELICA PLUS LANCET33G) MISC Use as instructed to test blood glucose 3 times daily. 100 each 3   metFORMIN  (GLUCOPHAGE -XR) 500 MG 24 hr tablet Take 2 tablets (1,000 mg total) by mouth 2 (two) times daily. 360 tablet 1   metoprolol  tartrate (LOPRESSOR ) 50 MG tablet Take 1 tablet (50 mg total) by mouth daily. 30 tablet 5   Multiple Vitamin (MULTIVITAMIN WITH MINERALS) TABS tablet Take 1 tablet by mouth daily.     nitroGLYCERIN  (NITROSTAT ) 0.4 MG SL tablet Place 1 tablet (0.4 mg total) under the tongue every 5 (five) minutes as needed for chest pain. 25 tablet 0   pantoprazole  (PROTONIX ) 40 MG tablet TAKE 1 TABLET TWICE DAILY 180 tablet 1   Semaglutide , 2 MG/DOSE, 8 MG/3ML SOPN Inject 2 mg as directed once a week. 6 mL 1   sildenafil  (REVATIO ) 20 MG tablet Take 1 tablet (20 mg total) by mouth 3 (three) times daily. 270 tablet 3   TOUJEO  MAX SOLOSTAR 300 UNIT/ML Solostar Pen INJECT 50 UNITS INTO THE SKIN DAILY (DOSE DECREASE) 18 mL 3   Vitamin D , Cholecalciferol, 25 MCG (1000 UT) TABS Take 1,000 mcg by mouth daily.     No facility-administered medications prior to visit.     Review of Systems:   Constitutional: No weight loss or gain, night sweats, fevers, chills, fatigue, or lassitude.  HEENT: No headaches, difficulty swallowing, tooth/dental problems, or sore throat. No sneezing, itching, ear ache +occasional allergies  CV:  No chest pain, orthopnea, swelling in lower extremities, PND, anasarca, dizziness, palpitations, syncope Resp: +shortness of breath with exertion (stable). No excess mucus or change in color of mucus. No productive or non-productive. No hemoptysis. No wheezing.  No chest wall deformity GI:  No heartburn, indigestion, abdominal pain, nausea, vomiting, diarrhea, change in bowel habits, loss of appetite, bloody stools.  GU: No dysuria, change in color of urine,  urgency or frequency.   Skin: No rash, lesions, ulcerations MSK:  No joint pain or swelling.   Neuro: No dizziness or lightheadedness.  Psych: No depression or anxiety. Mood stable.     Physical Exam:  BP 134/62 (BP Location: Right Arm, Patient Position: Sitting)   Pulse 89   Ht 5' 4 (1.626 m)   Wt 221 lb 3.2 oz (100.3 kg)   LMP  (LMP Unknown)   SpO2 96%   BMI 37.97 kg/m   GEN: Pleasant, interactive, well-appearing; obese; in no acute distress HEENT:  Normocephalic and  atraumatic. PERRLA. Sclera white. Nasal turbinates pink, moist and patent bilaterally. No rhinorrhea present. Oropharynx pink and moist, without exudate or edema. No lesions, ulcerations, or postnasal drip.  NECK:  Supple w/ fair ROM. Thyroid  symmetrical with no goiter or nodules palpated. No lymphadenopathy.   CV: RRR, no m/r/g, no peripheral edema. Pulses intact, +2 bilaterally. No cyanosis, pallor or clubbing. PULMONARY:  Unlabored, regular breathing. Clear bilaterally A&P w/o wheezes/rales/rhonchi. No accessory muscle use.  GI: BS present and normoactive. Soft, non-tender to palpation. No organomegaly or masses detected.  MSK: No erythema, warmth or tenderness. Cap refil <2 sec all extrem. No deformities or joint swelling noted.  Neuro: A/Ox3. No focal deficits noted.   Skin: Warm, no lesions or rashe Psych: Normal affect and behavior. Judgement and thought content appropriate.     Lab Results:  CBC    Component Value Date/Time   WBC 6.7 06/24/2023 1150   RBC 4.87 06/24/2023 1150   HGB 13.3 07/14/2023 1058   HGB 12.2 07/14/2023 1058   HGB 12.1 04/10/2021 1248   HCT 39.0 07/14/2023 1058   HCT 36.0 07/14/2023 1058   HCT 37.0 04/10/2021 1248   PLT 261 06/24/2023 1150   PLT 261 04/10/2021 1248   MCV 88.9 06/24/2023 1150   MCV 83 04/10/2021 1248   MCH 28.1 06/24/2023 1150   MCHC 31.6 06/24/2023 1150   RDW 16.8 (H) 06/24/2023 1150   RDW 13.7 04/10/2021 1248   LYMPHSABS 1.5 12/18/2021 1230    LYMPHSABS 1.9 01/21/2021 1618   MONOABS 0.7 12/18/2021 1230   EOSABS 0.1 12/18/2021 1230   EOSABS 0.1 01/21/2021 1618   BASOSABS 0.0 12/18/2021 1230   BASOSABS 0.0 01/21/2021 1618    BMET    Component Value Date/Time   NA 142 09/05/2023 1020   NA 140 11/02/2022 1500   K 4.2 09/05/2023 1020   CL 104 09/05/2023 1020   CO2 25 09/05/2023 1020   GLUCOSE 76 09/05/2023 1020   BUN 31 (H) 09/05/2023 1020   BUN 28 (H) 11/02/2022 1500   CREATININE 2.06 (H) 09/05/2023 1020   CREATININE 1.02 02/27/2015 1236   CALCIUM  9.8 09/05/2023 1020   GFRNONAA 26 (L) 09/05/2023 1020   GFRNONAA 61 02/27/2015 1236   GFRAA 46 (L) 10/04/2019 0940   GFRAA 71 02/27/2015 1236    BNP    Component Value Date/Time   BNP 566.5 (H) 08/15/2023 1606     Imaging:  No results found.  Administration History     None          Latest Ref Rng & Units 03/21/2023    3:59 PM 05/28/2021    9:53 AM  PFT Results  FVC-Pre L 1.84  1.63   FVC-Predicted Pre % 55  61   FVC-Post L 1.86  1.71   FVC-Predicted Post % 56  64   Pre FEV1/FVC % % 69  78   Post FEV1/FCV % % 77  81   FEV1-Pre L 1.27  1.27   FEV1-Predicted Pre % 50  61   FEV1-Post L 1.43  1.39   DLCO uncorrected ml/min/mmHg 9.80  9.06   DLCO UNC% % 47  43   DLCO corrected ml/min/mmHg 9.95    DLCO COR %Predicted % 48    DLVA Predicted % 90  78   TLC L 3.68  3.33   TLC % Predicted % 70  63   RV % Predicted % 87  77     No results found for: NITRICOXIDE  Assessment & Plan:   Restrictive lung disease Likely multifactorial. Volume status stable with improved dyspnea. Encouraged to work on healthy weight loss and smoking cessation. Reversibility on PFTs, consistent with possibility of asthma. No significant change with SABA in the past. Never tried ICS/LABA. No respiratory symptoms reported today. Continue to monitor.   Patient Instructions  Continue Albuterol  inhaler 2 puffs every 6 hours as needed for shortness of breath or wheezing.  Notify if symptoms persist despite rescue inhaler/neb use.    Start Wellbutrin  - 1 tab (150 mg) daily for 3 days then increase to 1 tab twice daily. Notify of any mood changes immediately. Do not drink alcohol while you are on this. This should help with quitting smoking. Gradually cut back with aim quit date in 4-6 weeks  Monitor your blood pressure closely while on the wellbutrin  (bupropion ) and taking metoprolol . If you're noticing blood pressures <100/60 or getting dizzy/lightheaded, notify us  or Dr. Newlin immediately so we can address this and adjust medications if needed    Follow up with Dr. Cherrie as scheduled Monitor your weights and notify if you gain 2-3 lb overnight or 5 lb in a week  Glad you are doing well!    Follow up in 6-8 weeks with Dr. Annella (new pt slot) or Katie Winslow Ederer,NP. If symptoms do not improve or worsen, please contact office for sooner follow up or seek emergency care.    Pulmonary hypertension (HCC) Severely elevated pulmonary artery pressures on RHC. Group II/III. Prior sleep study with very mild OSA. Euvolemic on exam today. Compliant with sildenafil , lasix , and farxiga . Plan to switch to Opsynvi. Follow up with Dr. Bensimhon as scheduled.   Tobacco use Smoking cessation strongly advised. Advised to start the Wellbutrin  for smoking cessation. Side effect profile reviewed. Understands proper use of medication and to monitor for any mood changes.    The patient's current tobacco use: 1/4 ppd The patient was advised to quit and impact of smoking on their health.  I assessed the patient's willingness to attempt to quit. I provided methods and skills for cessation. We reviewed medication management of smoking session drugs if appropriate. Resources to help quit smoking were provided. A smoking cessation quit date was set: 09/22/2023 Follow-up was arranged in our clinic.  The amount of time spent counseling patient was 5 mins  Respiratory bronchiolitis  interstitial lung disease (HCC) Smoking cessation advised  Lung nodule Lung RADS 2. Due Sept 2025     Advised if symptoms do not improve or worsen, to please contact office for sooner follow up or seek emergency care.   I spent 25 minutes of dedicated to the care of this patient on the date of this encounter to include pre-visit review of records, face-to-face time with the patient discussing conditions above, post visit ordering of testing, clinical documentation with the electronic health record, making appropriate referrals as documented, and communicating necessary findings to members of the patients care team.  Comer LULLA Rouleau, NP 10/20/2023  Pt aware and understands NP's role.

## 2023-10-20 NOTE — Patient Instructions (Addendum)
 Continue Albuterol  inhaler 2 puffs every 6 hours as needed for shortness of breath or wheezing. Notify if symptoms persist despite rescue inhaler/neb use.    Start Wellbutrin  - 1 tab (150 mg) daily for 3 days then increase to 1 tab twice daily. Notify of any mood changes immediately. Do not drink alcohol while you are on this. This should help with quitting smoking. Gradually cut back with aim quit date in 4-6 weeks  Monitor your blood pressure closely while on the wellbutrin  (bupropion ) and taking metoprolol . If you're noticing blood pressures <100/60 or getting dizzy/lightheaded, notify us  or Dr. Newlin immediately so we can address this and adjust medications if needed    Follow up with Dr. Cherrie as scheduled Monitor your weights and notify if you gain 2-3 lb overnight or 5 lb in a week  Glad you are doing well!    Follow up in 6-8 weeks with Dr. Annella (new pt slot) or Katie Jago Carton,NP. If symptoms do not improve or worsen, please contact office for sooner follow up or seek emergency care.

## 2023-11-28 ENCOUNTER — Ambulatory Visit
Admission: RE | Admit: 2023-11-28 | Discharge: 2023-11-28 | Disposition: A | Discharge status: Home / Self Care | Payer: Medicare PPO | Source: Ambulatory Visit | Attending: Family Medicine | Admitting: Family Medicine

## 2023-11-28 ENCOUNTER — Ambulatory Visit: Payer: Self-pay | Admitting: Family Medicine

## 2023-11-28 DIAGNOSIS — R928 Other abnormal and inconclusive findings on diagnostic imaging of breast: Secondary | ICD-10-CM | POA: Diagnosis not present

## 2023-11-28 DIAGNOSIS — N6489 Other specified disorders of breast: Secondary | ICD-10-CM

## 2023-12-05 ENCOUNTER — Encounter: Payer: Self-pay | Admitting: Emergency Medicine

## 2023-12-11 ENCOUNTER — Inpatient Hospital Stay (HOSPITAL_COMMUNITY)
Admission: EM | Admit: 2023-12-11 | Discharge: 2023-12-15 | DRG: 682 | Disposition: A | Discharge status: Home / Self Care | Attending: Student in an Organized Health Care Education/Training Program | Admitting: Student in an Organized Health Care Education/Training Program

## 2023-12-11 ENCOUNTER — Emergency Department (HOSPITAL_COMMUNITY)

## 2023-12-11 ENCOUNTER — Encounter (HOSPITAL_COMMUNITY): Payer: Self-pay

## 2023-12-11 ENCOUNTER — Other Ambulatory Visit: Payer: Self-pay

## 2023-12-11 DIAGNOSIS — R5383 Other fatigue: Secondary | ICD-10-CM | POA: Diagnosis not present

## 2023-12-11 DIAGNOSIS — I2781 Cor pulmonale (chronic): Secondary | ICD-10-CM | POA: Diagnosis present

## 2023-12-11 DIAGNOSIS — R0602 Shortness of breath: Secondary | ICD-10-CM | POA: Diagnosis not present

## 2023-12-11 DIAGNOSIS — Z7984 Long term (current) use of oral hypoglycemic drugs: Secondary | ICD-10-CM | POA: Diagnosis not present

## 2023-12-11 DIAGNOSIS — F32A Depression, unspecified: Secondary | ICD-10-CM | POA: Diagnosis present

## 2023-12-11 DIAGNOSIS — T65221A Toxic effect of tobacco cigarettes, accidental (unintentional), initial encounter: Secondary | ICD-10-CM | POA: Diagnosis present

## 2023-12-11 DIAGNOSIS — E669 Obesity, unspecified: Secondary | ICD-10-CM | POA: Diagnosis not present

## 2023-12-11 DIAGNOSIS — J684 Chronic respiratory conditions due to chemicals, gases, fumes and vapors: Secondary | ICD-10-CM | POA: Diagnosis present

## 2023-12-11 DIAGNOSIS — Z66 Do not resuscitate: Secondary | ICD-10-CM | POA: Diagnosis not present

## 2023-12-11 DIAGNOSIS — I5021 Acute systolic (congestive) heart failure: Secondary | ICD-10-CM | POA: Diagnosis not present

## 2023-12-11 DIAGNOSIS — D631 Anemia in chronic kidney disease: Secondary | ICD-10-CM | POA: Diagnosis not present

## 2023-12-11 DIAGNOSIS — I2721 Secondary pulmonary arterial hypertension: Secondary | ICD-10-CM | POA: Diagnosis not present

## 2023-12-11 DIAGNOSIS — I2723 Pulmonary hypertension due to lung diseases and hypoxia: Secondary | ICD-10-CM | POA: Diagnosis present

## 2023-12-11 DIAGNOSIS — J439 Emphysema, unspecified: Secondary | ICD-10-CM | POA: Diagnosis present

## 2023-12-11 DIAGNOSIS — I2510 Atherosclerotic heart disease of native coronary artery without angina pectoris: Secondary | ICD-10-CM | POA: Diagnosis present

## 2023-12-11 DIAGNOSIS — N179 Acute kidney failure, unspecified: Secondary | ICD-10-CM | POA: Diagnosis not present

## 2023-12-11 DIAGNOSIS — Z9049 Acquired absence of other specified parts of digestive tract: Secondary | ICD-10-CM

## 2023-12-11 DIAGNOSIS — Z8673 Personal history of transient ischemic attack (TIA), and cerebral infarction without residual deficits: Secondary | ICD-10-CM

## 2023-12-11 DIAGNOSIS — I5033 Acute on chronic diastolic (congestive) heart failure: Secondary | ICD-10-CM | POA: Diagnosis not present

## 2023-12-11 DIAGNOSIS — K219 Gastro-esophageal reflux disease without esophagitis: Secondary | ICD-10-CM | POA: Diagnosis present

## 2023-12-11 DIAGNOSIS — E66812 Obesity, class 2: Secondary | ICD-10-CM | POA: Diagnosis present

## 2023-12-11 DIAGNOSIS — Z794 Long term (current) use of insulin: Secondary | ICD-10-CM | POA: Diagnosis not present

## 2023-12-11 DIAGNOSIS — Z7982 Long term (current) use of aspirin: Secondary | ICD-10-CM

## 2023-12-11 DIAGNOSIS — Z881 Allergy status to other antibiotic agents status: Secondary | ICD-10-CM

## 2023-12-11 DIAGNOSIS — Z951 Presence of aortocoronary bypass graft: Secondary | ICD-10-CM

## 2023-12-11 DIAGNOSIS — E7800 Pure hypercholesterolemia, unspecified: Secondary | ICD-10-CM | POA: Diagnosis present

## 2023-12-11 DIAGNOSIS — Z79899 Other long term (current) drug therapy: Secondary | ICD-10-CM

## 2023-12-11 DIAGNOSIS — D649 Anemia, unspecified: Principal | ICD-10-CM | POA: Diagnosis present

## 2023-12-11 DIAGNOSIS — N1832 Chronic kidney disease, stage 3b: Secondary | ICD-10-CM | POA: Diagnosis present

## 2023-12-11 DIAGNOSIS — F17290 Nicotine dependence, other tobacco product, uncomplicated: Secondary | ICD-10-CM | POA: Diagnosis present

## 2023-12-11 DIAGNOSIS — Z6837 Body mass index (BMI) 37.0-37.9, adult: Secondary | ICD-10-CM

## 2023-12-11 DIAGNOSIS — J4489 Other specified chronic obstructive pulmonary disease: Secondary | ICD-10-CM | POA: Diagnosis present

## 2023-12-11 DIAGNOSIS — K5900 Constipation, unspecified: Secondary | ICD-10-CM | POA: Diagnosis not present

## 2023-12-11 DIAGNOSIS — I517 Cardiomegaly: Secondary | ICD-10-CM | POA: Diagnosis not present

## 2023-12-11 DIAGNOSIS — I071 Rheumatic tricuspid insufficiency: Secondary | ICD-10-CM | POA: Diagnosis present

## 2023-12-11 DIAGNOSIS — G4733 Obstructive sleep apnea (adult) (pediatric): Secondary | ICD-10-CM | POA: Diagnosis present

## 2023-12-11 DIAGNOSIS — I5023 Acute on chronic systolic (congestive) heart failure: Secondary | ICD-10-CM | POA: Diagnosis not present

## 2023-12-11 DIAGNOSIS — Z8701 Personal history of pneumonia (recurrent): Secondary | ICD-10-CM

## 2023-12-11 DIAGNOSIS — R0989 Other specified symptoms and signs involving the circulatory and respiratory systems: Secondary | ICD-10-CM | POA: Diagnosis not present

## 2023-12-11 DIAGNOSIS — Z8269 Family history of other diseases of the musculoskeletal system and connective tissue: Secondary | ICD-10-CM

## 2023-12-11 DIAGNOSIS — E1122 Type 2 diabetes mellitus with diabetic chronic kidney disease: Secondary | ICD-10-CM | POA: Diagnosis not present

## 2023-12-11 DIAGNOSIS — Z8249 Family history of ischemic heart disease and other diseases of the circulatory system: Secondary | ICD-10-CM

## 2023-12-11 DIAGNOSIS — I272 Pulmonary hypertension, unspecified: Secondary | ICD-10-CM | POA: Diagnosis not present

## 2023-12-11 DIAGNOSIS — F17210 Nicotine dependence, cigarettes, uncomplicated: Secondary | ICD-10-CM | POA: Diagnosis present

## 2023-12-11 DIAGNOSIS — J90 Pleural effusion, not elsewhere classified: Secondary | ICD-10-CM | POA: Diagnosis not present

## 2023-12-11 DIAGNOSIS — I10 Essential (primary) hypertension: Secondary | ICD-10-CM | POA: Diagnosis not present

## 2023-12-11 DIAGNOSIS — I130 Hypertensive heart and chronic kidney disease with heart failure and stage 1 through stage 4 chronic kidney disease, or unspecified chronic kidney disease: Secondary | ICD-10-CM | POA: Diagnosis not present

## 2023-12-11 DIAGNOSIS — N183 Chronic kidney disease, stage 3 unspecified: Secondary | ICD-10-CM | POA: Diagnosis not present

## 2023-12-11 DIAGNOSIS — T502X5A Adverse effect of carbonic-anhydrase inhibitors, benzothiadiazides and other diuretics, initial encounter: Secondary | ICD-10-CM | POA: Diagnosis not present

## 2023-12-11 DIAGNOSIS — Z833 Family history of diabetes mellitus: Secondary | ICD-10-CM

## 2023-12-11 HISTORY — DX: Heart failure, unspecified: I50.9

## 2023-12-11 LAB — POC OCCULT BLOOD, ED: Fecal Occult Bld: NEGATIVE

## 2023-12-11 LAB — HEMOGLOBIN A1C
Hgb A1c MFr Bld: 6.1 % — ABNORMAL HIGH (ref 4.8–5.6)
Mean Plasma Glucose: 128.37 mg/dL

## 2023-12-11 LAB — CBC
HCT: 30.9 % — ABNORMAL LOW (ref 36.0–46.0)
HCT: 30.4 % — ABNORMAL LOW (ref 36.0–46.0)
Hemoglobin: 9.5 g/dL — ABNORMAL LOW (ref 12.0–15.0)
Hemoglobin: 9.5 g/dL — ABNORMAL LOW (ref 12.0–15.0)
MCH: 28.9 pg (ref 26.0–34.0)
MCH: 28.5 pg (ref 26.0–34.0)
MCHC: 30.7 g/dL (ref 30.0–36.0)
MCHC: 31.3 g/dL (ref 30.0–36.0)
MCV: 93.9 fL (ref 80.0–100.0)
MCV: 91.3 fL (ref 80.0–100.0)
Platelets: 252 K/uL (ref 150–400)
Platelets: 239 K/uL (ref 150–400)
RBC: 3.29 MIL/uL — ABNORMAL LOW (ref 3.87–5.11)
RBC: 3.33 MIL/uL — ABNORMAL LOW (ref 3.87–5.11)
RDW: 16.5 % — ABNORMAL HIGH (ref 11.5–15.5)
RDW: 16.3 % — ABNORMAL HIGH (ref 11.5–15.5)
WBC: 4.9 K/uL (ref 4.0–10.5)
WBC: 5.4 K/uL (ref 4.0–10.5)
nRBC: 0 % (ref 0.0–0.2)
nRBC: 0 % (ref 0.0–0.2)

## 2023-12-11 LAB — TROPONIN I (HIGH SENSITIVITY)
Troponin I (High Sensitivity): 13 ng/L (ref <18)
Troponin I (High Sensitivity): 13 ng/L (ref <18)

## 2023-12-11 LAB — BASIC METABOLIC PANEL WITH GFR
Anion gap: 11 (ref 5–15)
BUN: 16 mg/dL (ref 8–23)
CO2: 19 mmol/L — ABNORMAL LOW (ref 22–32)
Calcium: 9.3 mg/dL (ref 8.9–10.3)
Chloride: 110 mmol/L (ref 98–111)
Creatinine, Ser: 1.33 mg/dL — ABNORMAL HIGH (ref 0.44–1.00)
GFR, Estimated: 44 mL/min — ABNORMAL LOW (ref >60)
Glucose, Bld: 106 mg/dL — ABNORMAL HIGH (ref 70–99)
Potassium: 3.7 mmol/L (ref 3.5–5.1)
Sodium: 140 mmol/L (ref 135–145)

## 2023-12-11 LAB — CREATININE, SERUM
Creatinine, Ser: 1.38 mg/dL — ABNORMAL HIGH (ref 0.44–1.00)
GFR, Estimated: 42 mL/min — ABNORMAL LOW (ref >60)

## 2023-12-11 LAB — RETICULOCYTES
Immature Retic Fract: 22.4 % — ABNORMAL HIGH (ref 2.3–15.9)
RBC.: 3.3 MIL/uL — ABNORMAL LOW (ref 3.87–5.11)
Retic Count, Absolute: 91.4 K/uL (ref 19.0–186.0)
Retic Ct Pct: 2.8 % (ref 0.4–3.1)

## 2023-12-11 LAB — IRON AND TIBC
Iron: 51 ug/dL (ref 28–170)
Saturation Ratios: 13 % (ref 10.4–31.8)
TIBC: 402 ug/dL (ref 250–450)
UIBC: 351 ug/dL

## 2023-12-11 LAB — FERRITIN: Ferritin: 14 ng/mL (ref 11–307)

## 2023-12-11 LAB — RESP PANEL BY RT-PCR (RSV, FLU A&B, COVID)  RVPGX2
Influenza A by PCR: NEGATIVE
Influenza B by PCR: NEGATIVE
Resp Syncytial Virus by PCR: NEGATIVE
SARS Coronavirus 2 by RT PCR: NEGATIVE

## 2023-12-11 LAB — FOLATE: Folate: 32.6 ng/mL (ref >5.9)

## 2023-12-11 LAB — VITAMIN B12: Vitamin B-12: 440 pg/mL (ref 180–914)

## 2023-12-11 LAB — GLUCOSE, CAPILLARY: Glucose-Capillary: 71 mg/dL (ref 70–99)

## 2023-12-11 LAB — HIV ANTIBODY (ROUTINE TESTING W REFLEX): HIV Screen 4th Generation wRfx: NONREACTIVE

## 2023-12-11 LAB — BRAIN NATRIURETIC PEPTIDE: B Natriuretic Peptide: 673.3 pg/mL — ABNORMAL HIGH (ref 0.0–100.0)

## 2023-12-11 MED ORDER — ENOXAPARIN SODIUM 40 MG/0.4ML IJ SOSY
40.0000 mg | PREFILLED_SYRINGE | INTRAMUSCULAR | Status: DC
  Administered 2023-12-11 – 2023-12-14 (×4): 40 mg via SUBCUTANEOUS
  Filled 2023-12-11 (×4): qty 0.4

## 2023-12-11 MED ORDER — SODIUM CHLORIDE 0.9% FLUSH
3.0000 mL | Freq: Two times a day (BID) | INTRAVENOUS | Status: DC
  Administered 2023-12-11 – 2023-12-15 (×7): 3 mL via INTRAVENOUS

## 2023-12-11 MED ORDER — ONDANSETRON HCL 4 MG/2ML IJ SOLN: 4.0000 mg | Freq: Four times a day (QID) | INTRAMUSCULAR | Status: DC | PRN

## 2023-12-11 MED ORDER — SODIUM CHLORIDE 0.9 % IV SOLN: 250.0000 mL | INTRAVENOUS | Status: AC | PRN

## 2023-12-11 MED ORDER — DAPAGLIFLOZIN PROPANEDIOL 10 MG PO TABS
10.0000 mg | ORAL_TABLET | Freq: Every day | ORAL | Status: DC
Start: 2023-12-11 — End: 2023-12-15
  Administered 2023-12-12 – 2023-12-15 (×4): 10 mg via ORAL
  Filled 2023-12-11 (×5): qty 1

## 2023-12-11 MED ORDER — VITAMIN B-12 1000 MCG PO TABS
1000.0000 ug | ORAL_TABLET | Freq: Every day | ORAL | Status: DC
  Administered 2023-12-11 – 2023-12-15 (×5): 1000 ug via ORAL
  Filled 2023-12-11 (×5): qty 1

## 2023-12-11 MED ORDER — PANTOPRAZOLE SODIUM 40 MG PO TBEC
40.0000 mg | DELAYED_RELEASE_TABLET | Freq: Two times a day (BID) | ORAL | Status: DC
  Administered 2023-12-11 – 2023-12-15 (×8): 40 mg via ORAL
  Filled 2023-12-11 (×8): qty 1

## 2023-12-11 MED ORDER — INSULIN ASPART 100 UNIT/ML IJ SOLN
0.0000 [IU] | Freq: Three times a day (TID) | INTRAMUSCULAR | Status: DC
  Administered 2023-12-12: 2 [IU] via SUBCUTANEOUS
  Administered 2023-12-14 – 2023-12-15 (×2): 3 [IU] via SUBCUTANEOUS
  Administered 2023-12-15: 2 [IU] via SUBCUTANEOUS

## 2023-12-11 MED ORDER — IPRATROPIUM-ALBUTEROL 0.5-2.5 (3) MG/3ML IN SOLN
3.0000 mL | Freq: Once | RESPIRATORY_TRACT | Status: AC
  Administered 2023-12-11: 3 mL via RESPIRATORY_TRACT
  Filled 2023-12-11: qty 3

## 2023-12-11 MED ORDER — ATORVASTATIN CALCIUM 10 MG PO TABS
10.0000 mg | ORAL_TABLET | Freq: Every day | ORAL | Status: DC
  Administered 2023-12-11 – 2023-12-15 (×5): 10 mg via ORAL
  Filled 2023-12-11 (×5): qty 1

## 2023-12-11 MED ORDER — ACETAMINOPHEN 500 MG PO TABS
1000.0000 mg | ORAL_TABLET | Freq: Four times a day (QID) | ORAL | Status: DC | PRN
  Administered 2023-12-11 – 2023-12-14 (×4): 1000 mg via ORAL
  Filled 2023-12-11 (×4): qty 2

## 2023-12-11 MED ORDER — INSULIN ASPART 100 UNIT/ML IJ SOLN: 0.0000 [IU] | Freq: Every day | INTRAMUSCULAR | Status: DC

## 2023-12-11 MED ORDER — FUROSEMIDE 10 MG/ML IJ SOLN
80.0000 mg | Freq: Once | INTRAMUSCULAR | Status: AC
  Administered 2023-12-11: 80 mg via INTRAVENOUS
  Filled 2023-12-11: qty 8

## 2023-12-11 MED ORDER — SODIUM CHLORIDE 0.9% FLUSH: 3.0000 mL | INTRAVENOUS | Status: DC | PRN

## 2023-12-11 MED ORDER — FUROSEMIDE 10 MG/ML IJ SOLN
60.0000 mg | Freq: Once | INTRAMUSCULAR | Status: AC
  Administered 2023-12-11: 60 mg via INTRAVENOUS
  Filled 2023-12-11: qty 6

## 2023-12-11 MED ORDER — GABAPENTIN 300 MG PO CAPS
300.0000 mg | ORAL_CAPSULE | Freq: Two times a day (BID) | ORAL | Status: DC
  Administered 2023-12-11 – 2023-12-15 (×8): 300 mg via ORAL
  Filled 2023-12-11 (×8): qty 1

## 2023-12-11 MED ORDER — METOPROLOL TARTRATE 50 MG PO TABS
50.0000 mg | ORAL_TABLET | Freq: Every day | ORAL | Status: DC
  Administered 2023-12-11 – 2023-12-12 (×2): 50 mg via ORAL
  Filled 2023-12-11 (×3): qty 1

## 2023-12-11 MED ORDER — INSULIN GLARGINE 100 UNIT/ML ~~LOC~~ SOLN
40.0000 [IU] | Freq: Every day | SUBCUTANEOUS | Status: DC
  Administered 2023-12-11 – 2023-12-12 (×2): 40 [IU] via SUBCUTANEOUS
  Filled 2023-12-11 (×4): qty 0.4

## 2023-12-11 NOTE — ED Notes (Signed)
 Hourly rounding complete. Pt alert or resting, no distress noted, offered toileting and diet as appropriate. Side rails up, call light within reach. Pt denies pain or further needs at this time.

## 2023-12-11 NOTE — H&P (Signed)
 History and Physical    Rolande Moe FMW:[id number] DOB: 1955-05-26 DOA: 12/11/2023  PCP: [name], MD  Patient coming from: Home  I have personally briefly reviewed patient's old medical records in [facility]  Chief Complaint: Shortness of breath  HPI: Deborah Coffey is a 68 y.o. female with medical history significant of CHF, [facility] March 2025 noting severe PAH with cor pulmonale, type 2 diabetes mellitus, hypertension presented to ED with a complaint of shortness of breath and leg swelling.  Per patient, she started having symptoms since last few days.  She states that she was started on opsyn 6 about 2 months ago however symptoms started only few days ago.  Her shortness of breath gets worse with exertion mostly but somewhat with laying flat and she feels better when sitting up.  Denies any chest pain or any other complaint.  Denies any nausea, vomiting, dizziness, palpitation, fever, chills, sweating, any problem with urination or with bowel movement.  No recent travel or sick contact.  ED Course: Upon arrival to the ED, fairly hemodynamically stable, minimally tachypneic, blood pressure stable.  Creatinine better than her baseline. Chest x-ray shows cardiomegaly and vascular congestion.  CBC showed anemia with hemoglobin of 9.5 which was 12.25 months ago.  FOBT negative.  Received 1 dose of Lasix  80 mg IV in the ED.  Review of Systems: As per HPI otherwise negative.    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 competitive flow   TIA (transient ischemic attack)    Type II diabetes mellitus (HCC)     Past Surgical History:  Procedure Laterality Date   CARDIAC CATHETERIZATION  2010   Previous LAD 95% lesion - now ~30-40%; patent LIMA with competitive flow   CESAREAN SECTION  1977; 1989   CHOLECYSTECTOMY  ~ 2012   CORONARY ARTERY BYPASS GRAFT  02/2004   CABG X1 (03/22/2013); LIMA-LAD   RIGHT HEART CATH N/A 07/14/2023   Procedure: RIGHT HEART CATH;  Surgeon: Cherrie Toribio SAUNDERS, MD;  Location: [facility] INVASIVE CV LAB;  Service: Cardiovascular;  Laterality: N/A;   RIGHT/LEFT HEART CATH AND CORONARY/GRAFT ANGIOGRAPHY N/A 04/16/2021   Procedure: RIGHT/LEFT HEART CATH AND CORONARY/GRAFT ANGIOGRAPHY;  Surgeon: Dann Candyce RAMAN, MD;  Location: [facility] INVASIVE CV LAB;  Service: Cardiovascular;  Laterality: N/A;   TUBAL LIGATION  1989     reports that she has been smoking cigarettes. She has a 10.5 pack-year smoking history. She uses smokeless tobacco. She reports that she does not currently use alcohol. She reports that she does not use drugs.  Allergies  Allergen Reactions   Ciprofloxacin Rash    Family History  Problem Relation Age of Onset   Heart disease Mother    Diabetes Mother    Cancer Mother        cervical   Diabetes Father  Diabetes Sister    Heart disease Sister    Lupus Sister    Heart failure Sister    Hepatitis C Sister    Hypertension Daughter    Diabetes Brother    Heart attack Brother    Heart failure Brother    Diabetes Brother    Colon cancer Neg Hx    Breast cancer Neg Hx     Prior to Admission medications   Medication Sig Start Date End Date Taking? Authorizing Provider  acetaminophen  (TYLENOL ) 500 MG tablet Take 1,000 mg by mouth every 6 (six) hours as needed for headache or moderate pain (pain score 4-6).   Yes [provider]  aspirin  EC 81 MG tablet Take 81 mg by mouth daily.    Yes [provider]  atorvastatin  (LIPITOR) 10 MG tablet TAKE 1 TABLET ONE TIME DAILY 10/17/23  Yes Newlin, Enobong, MD  cyanocobalamin  (VITAMIN B12) 1000 MCG tablet Take 1,000 mcg by mouth daily.   Yes [provider]  dapagliflozin  propanediol (FARXIGA ) 10 MG TABS tablet TAKE 1 TABLET EVERY DAY BEFORE BREAKFAST 10/17/23  Yes Newlin, Enobong, MD  furosemide  (LASIX ) 40 MG tablet Take 40-80 mg by mouth See admin instructions. Take 80 mg by mouth in the morning and 40 mg at night   Yes [provider]  gabapentin  (NEURONTIN ) 300 MG capsule Take 300 mg by mouth 2 (two) times daily.   Yes [provider]  Macitentan -Tadalafil  (OPSYNVI) 10-40 MG TABS Take 1 tablet by mouth in the morning.   Yes [provider]  metFORMIN  (GLUCOPHAGE -XR) 500 MG 24 hr tablet Take 2 tablets (1,000 mg total) by mouth 2 (two) times daily. Patient taking differently: Take 500 mg by mouth 2 (two) times daily. 02/02/23  Yes Newlin, Enobong, MD  metoprolol  tartrate (LOPRESSOR ) 50 MG tablet Take 1 tablet (50 mg total) by mouth daily. 10/11/23  Yes Bensimhon, Daniel R, MD  Multiple Vitamin (MULTIVITAMIN WITH MINERALS) TABS tablet Take 1 tablet by mouth daily.   Yes [provider]  nitroGLYCERIN  (NITROSTAT ) 0.4 MG SL tablet Place 1 tablet (0.4 mg total) under the tongue every 5 (five) minutes as needed for chest pain. 03/03/23  Yes Banister, Sharlet POUR, MD  pantoprazole  (PROTONIX ) 40 MG tablet TAKE 1 TABLET TWICE DAILY 10/17/23  Yes Newlin, Enobong, MD  TOUJEO  MAX SOLOSTAR 300 UNIT/ML Solostar Pen INJECT 50 UNITS INTO THE SKIN DAILY (DOSE DECREASE) Patient taking differently: Inject 40 Units into the skin daily. 10/17/23  Yes Newlin, Corrina, MD  Vitamin D , Cholecalciferol, 25 MCG (1000 UT) TABS Take 1,000 mcg by mouth daily.   Yes [provider]  Alcohol Swabs (B-D SINGLE USE SWABS REGULAR) PADS USE AS DIRECTED 04/13/17   Jegede, Olugbemiga E, MD  Blood Glucose Monitoring  Suppl (ONETOUCH VERIO) w/Device KIT Use as instructed to test blood glucose 3 times daily. 02/05/22   Newlin, Enobong, MD  Continuous Glucose Sensor (DEXCOM G6 SENSOR) MISC Use to check blood sugar as directed. Dx. E11.59 08/10/23   Newlin, Enobong, MD  diclofenac  Sodium (VOLTAREN ) 1 % GEL Apply 2 g topically daily as needed (muscle pain). Patient not taking: Reported on 12/11/2023 02/02/23   Newlin, Enobong, MD  glucose blood (ONETOUCH VERIO) test strip Use as instructed to test blood glucose 3 times daily. 02/05/22   Newlin, Enobong, MD  Insulin  Pen Needle (DROPLET PEN NEEDLES) 31G X 8 MM MISC USE WITH TOUJEO  SOLOSTAR PEN EVERY DAY 02/02/23   Newlin, Enobong, MD  Lancets [facility] DELICA PLUS  LANCET33G) MISC Use as instructed to test blood glucose 3 times daily. 02/05/22   [name], MD    Physical Exam: Vitals:   12/11/23 1500 12/11/23 1515 12/11/23 1623 12/11/23 1845  BP: (!) 163/79 (!) 145/71 (!) 145/71 (!) 149/66  Pulse: 86 99 99 90  Resp:   18   Temp: 98.7 F (37.1 C)  98.7 F (37.1 C)   TempSrc:      SpO2: 96% 99% 100% 95%  Weight:      Height:        Constitutional: NAD, calm, comfortable Vitals:   12/11/23 1500 12/11/23 1515 12/11/23 1623 12/11/23 1845  BP: (!) 163/79 (!) 145/71 (!) 145/71 (!) 149/66  Pulse: 86 99 99 90  Resp:   18   Temp: 98.7 F (37.1 C)  98.7 F (37.1 C)   TempSrc:      SpO2: 96% 99% 100% 95%  Weight:      Height:       Eyes: PERRL, lids and conjunctivae normal, obese ENMT: Mucous membranes are moist. Posterior pharynx clear of any exudate or lesions.Normal dentition.  Neck: normal, supple, no masses, no thyromegaly Respiratory: clear to auscultation bilaterally, no wheezing, no crackles. Normal respiratory effort. No accessory muscle use.  Cardiovascular: Regular rate and rhythm, no murmurs / rubs / gallops. No extremity edema. 2+ pedal pulses. No carotid bruits.  Abdomen: no tenderness, no masses palpated. No hepatosplenomegaly. Bowel  sounds positive.  Musculoskeletal: no clubbing / cyanosis. No joint deformity upper and lower extremities. Good ROM, no contractures. Normal muscle tone.  Skin: no rashes, lesions, ulcers. No induration Neurologic: CN 2-12 grossly intact. Sensation intact, DTR normal. Strength 5/5 in all 4.  Psychiatric: Normal judgment and insight. Alert and oriented x 3. Normal mood.    Labs on Admission: I have personally reviewed following labs and imaging studies  CBC: Recent Labs  Lab 12/11/23 1313  WBC 4.9  HGB 9.5*  HCT 30.9*  MCV 93.9  PLT 252   Basic Metabolic Panel: Recent Labs  Lab 12/11/23 1313  NA 140  K 3.7  CL 110  CO2 19*  GLUCOSE 106*  BUN 16  CREATININE 1.33*  CALCIUM  9.3   GFR: Estimated Creatinine Clearance: 48.8 mL/min (A) (by C-G formula based on SCr of 1.33 mg/dL (H)). Liver Function Tests: No results for input(s): AST, ALT, ALKPHOS, BILITOT, PROT, ALBUMIN in the last 168 hours. No results for input(s): LIPASE, AMYLASE in the last 168 hours. No results for input(s): AMMONIA in the last 168 hours. Coagulation Profile: No results for input(s): INR, PROTIME in the last 168 hours. Cardiac Enzymes: No results for input(s): CKTOTAL, CKMB, CKMBINDEX, TROPONINI in the last 168 hours. BNP (last 3 results) No results for input(s): PROBNP in the last 8760 hours. HbA1C: No results for input(s): HGBA1C in the last 72 hours. CBG: No results for input(s): GLUCAP in the last 168 hours. Lipid Profile: No results for input(s): CHOL, HDL, LDLCALC, TRIG, CHOLHDL, LDLDIRECT in the last 72 hours. Thyroid  Function Tests: No results for input(s): TSH, T4TOTAL, FREET4, T3FREE, THYROIDAB in the last 72 hours. Anemia Panel: No results for input(s): VITAMINB12, FOLATE, FERRITIN, TIBC, IRON, RETICCTPCT in the last 72 hours. Urine analysis:    Component Value Date/Time   COLORURINE YELLOW 04/09/2020 1907    APPEARANCEUR CLEAR 04/09/2020 1907   APPEARANCEUR Clear 07/14/2016 1028   LABSPEC 1.010 04/09/2020 1907   PHURINE 5.0 04/09/2020 1907   GLUCOSEU 50 (A) 04/09/2020 1907   HGBUR NEGATIVE  04/09/2020 1907   BILIRUBINUR NEGATIVE 04/09/2020 1907   BILIRUBINUR Negative 07/14/2016 1028   KETONESUR NEGATIVE 04/09/2020 1907   PROTEINUR NEGATIVE 04/09/2020 1907   UROBILINOGEN 0.2 08/07/2014 1925   NITRITE NEGATIVE 04/09/2020 1907   LEUKOCYTESUR NEGATIVE 04/09/2020 1907    Radiological Exams on Admission: DG Chest 2 View Result Date: 12/11/2023 CLINICAL DATA:  Shortness of breath. EXAM: CHEST - 2 VIEW COMPARISON:  Chest radiograph dated 03/03/2023. FINDINGS: Mild cardiomegaly with mild vascular congestion. No focal consolidation, or pneumothorax. Small bilateral pleural effusions. No acute osseous pathology. Median sternotomy wires. IMPRESSION: Mild cardiomegaly with mild vascular congestion and small bilateral pleural effusions. Electronically Signed   By: Vanetta Chou M.D.   On: 12/11/2023 14:00    EKG: Independently reviewed.  Sinus rhythm with no acute ST-T wave changes.  Assessment/Plan Principal Problem:   Anemia   Dyspnea on exertion/acute on chronic congestive heart failure with preserved ejection fraction/pulmonary artery hypertension with cor pulmonale/acute anemia: Dyspnea could be due to CHF versus anemia.  Last echo completed in November 2024 shows normal ejection fraction.  Patient's BNP is elevated but appears to be close to her baseline.  Chest x-ray shows cardiomegaly with mild vascular congestion.  Recently patient's Lasix  dose was reduced and she was started on new medication (OPSYNVI ) and per EDP, he discussed with patient's primary cardiologist Dr. Bensimhon and according to Dr. Bensimhon, this medication can cause anemia however he recommended thorough workup of anemia and ruling out any other causes before discontinuing this medication.  Patient has received 1 dose of IV  Lasix  80 mg in the ED.  I will give her another 60 mg IV now.  Reassess tomorrow for possible resumption of the oral Lasix .  I we will start anemia workup, with iron studies as well as B12 and folate.  Patient's MCV is 93/slightly on the higher side so I doubt GI bleed.  FOBT x 1 is negative.  Will recheck FOBT again.  Will need to have 3 negative FOBTs to rule out GI bleed.  Repeat CBC in the morning.  No indication of transfusion.  She is not hypoxic.  She is already feeling better since she received a dose of Lasix  in the ED.  Type 2 diabetes mellitus: Last hemoglobin A1c in system was 7 years ago which was 13.  She is on Farxiga  at home as well as Lantus  40 units, will resume both of them and start on SSI.  Essential hypertension: Resume metoprolol .  Hyperlipidemia: Resume atorvastatin .  CKD stage IIIb/IV: Baseline creatinine appears to be anywhere from 1.6-2.0.  Currently 1.33, better than her baseline.  Monitor for now.  GERD: Resume PPI.  DVT prophylaxis: enoxaparin  (LOVENOX ) injection 40 mg Start: 12/11/23 1900 Code Status: DNR Family Communication: None present at bedside.  Plan of care discussed with patient in length and he verbalized understanding and agreed with it. Disposition Plan: May discharge tomorrow Consults called: EDP curb sided with Dr. Bensimhon by ED.  Will need to consult cardiology officially if required.  Fredia Skeeter MD [facility]  *Please note that this is a verbal dictation therefore any spelling or grammatical errors are due to the Dragon [employer] One system interpretation.  Please page via Amion and do not message via secure chat for urgent patient care matters. Secure chat can be used for non urgent patient care matters. 12/11/2023, 8:00 PM  To contact the attending provider between 7A-7P or the covering provider during after hours 7P-7A, please log into the web  site [email or web address]

## 2023-12-11 NOTE — ED Notes (Signed)
Pt amb to rr

## 2023-12-11 NOTE — ED Triage Notes (Signed)
 Pt c.o sob x 3 days with bilateral leg swelling. Denies chest pain, hx of CHF

## 2023-12-11 NOTE — ED Provider Notes (Signed)
 [place] [facility]MENT AT [facility] Provider Note   CSN: [id number] Arrival date & time: 12/11/23  1250     Patient presents with: Shortness of Breath and Leg Swelling   Deborah Coffey is a 68 y.o. female here with shortness of breath. Patient has a history of CHF, RHC March 2025 noting severe PAH with cor pulmonale.  Patient reports to me that she just feels really bad for the past 3 to 4 days.  She cannot describe dyspnea on that othershe feels weak and short of breath.  She denies any chest pain or pressure.  She reports she was sweating a lot yesterday.  She denies persistent fevers.  Denies coughing.  She reports compliance of all her medications.  She had been on Lasix  80 mg twice daily until recently, but her evening dose reduced to 40 mg.  She does smoke and has smoked for many years.  She sees a pulmonologist for this but does not have her breathe machine at home  Patient was seen by pulmonology 2 months ago for my review of their office records.  She has a history of mild central lobar emphysema.  Smoking-related respiratory bronchiolitis. On Opsynvi for PAH.  The patient feels that some of her exhaustion symptoms correlated with onset or initiation of this new medication about 3 weeks ago  Med hx includes CAD S/p CABG 2005, smoking, chronic diastolic CHF, anemia, depression, HTN, HLD, diabetes, CKD stage 3b, pulm HTN, morbid obesity.  On farxiga  10 mg dail.   HPI     Prior to Admission medications   Medication Sig Start Date End Date Taking? Authorizing Provider  acetaminophen  (TYLENOL ) 500 MG tablet Take 1,000 mg by mouth every 6 (six) hours as needed for headache or moderate pain (pain score 4-6).   Yes [provider]  aspirin  EC 81 MG tablet Take 81 mg by mouth daily.   Yes [provider]  atorvastatin  (LIPITOR) 10 MG tablet TAKE 1 TABLET ONE TIME DAILY 10/17/23  Yes Newlin, Enobong, MD  cyanocobalamin  (VITAMIN B12) 1000 MCG  tablet Take 1,000 mcg by mouth daily.   Yes [provider]  dapagliflozin  propanediol (FARXIGA ) 10 MG TABS tablet TAKE 1 TABLET EVERY DAY BEFORE BREAKFAST 10/17/23  Yes Newlin, Enobong, MD  furosemide  (LASIX ) 40 MG tablet Take 40-80 mg by mouth See admin instructions. Take 80 mg by mouth in the morning and 40 mg at night   Yes [provider]  gabapentin  (NEURONTIN ) 300 MG capsule Take 300 mg by mouth 2 (two) times daily.   Yes [provider]  Macitentan -Tadalafil  (OPSYNVI) 10-40 MG TABS Take 1 tablet by mouth in the morning.   Yes [provider]  metFORMIN  (GLUCOPHAGE -XR) 500 MG 24 hr tablet Take 2 tablets (1,000 mg total) by mouth 2 (two) times daily. Patient taking differently: Take 500 mg by mouth 2 (two) times daily. 02/02/23  Yes Newlin, Enobong, MD  metoprolol  tartrate (LOPRESSOR ) 50 MG tablet Take 1 tablet (50 mg total) by mouth daily. 10/11/23  Yes Bensimhon, Daniel R, MD  Multiple Vitamin (MULTIVITAMIN WITH MINERALS) TABS tablet Take 1 tablet by mouth daily.   Yes [provider]  nitroGLYCERIN  (NITROSTAT ) 0.4 MG SL tablet Place 1 tablet (0.4 mg total) under the tongue every 5 (five) minutes as needed for chest pain. 03/03/23  Yes Vonna Sharlet POUR, MD  pantoprazole  (PROTONIX ) 40 MG tablet TAKE 1 TABLET TWICE DAILY 10/17/23  Yes Newlin, Enobong, MD  TOUJEO  MAX SOLOSTAR 300  UNIT/ML Solostar Pen INJECT 50 UNITS INTO THE SKIN DAILY (DOSE DECREASE) Patient taking differently: Inject 40 Units into the skin daily. 10/17/23  Yes Newlin, Enobong, MD  Vitamin D , Cholecalciferol, 25 MCG (1000 UT) TABS Take 1,000 mcg by mouth daily.   Yes [provider]  Alcohol Swabs (B-D SINGLE USE SWABS REGULAR) PADS USE AS DIRECTED 04/13/17   Jegede, Olugbemiga E, MD  Blood Glucose Monitoring Suppl (ONETOUCH VERIO) w/Device KIT Use as instructed to test blood glucose 3 times daily. 02/05/22   Newlin, Enobong, MD  Continuous Glucose Sensor (DEXCOM G6 SENSOR)  MISC Use to check blood sugar as directed. Dx. E11.59 08/10/23   Newlin, Enobong, MD  diclofenac  Sodium (VOLTAREN ) 1 % GEL Apply 2 g topically daily as needed (muscle pain). Patient not taking: Reported on 12/11/2023 02/02/23   Newlin, Enobong, MD  glucose blood (ONETOUCH VERIO) test strip Use as instructed to test blood glucose 3 times daily. 02/05/22   Newlin, Enobong, MD  Insulin  Pen Needle (DROPLET PEN NEEDLES) 31G X 8 MM MISC USE WITH TOUJEO  SOLOSTAR PEN EVERY DAY 02/02/23   [name], MD  Lancets (ONETOUCH DELICA PLUS LANCET33G) MISC Use as instructed to test blood glucose 3 times daily. 02/05/22   Newlin, Enobong, MD    Allergies: Ciprofloxacin    Review of Systems  Updated Vital Signs BP (!) 149/66   Pulse 90   Temp 98.7 F (37.1 C)   Resp 18   Ht 5' 5 (1.651 m)   Wt 100.3 kg   LMP  (LMP Unknown)   SpO2 95%   BMI 36.80 kg/m   Physical Exam Constitutional:      General: She is not in acute distress. HENT:     Head: Normocephalic and atraumatic.  Eyes:     Conjunctiva/sclera: Conjunctivae normal.     Pupils: Pupils are equal, round, and reactive to light.  Cardiovascular:     Rate and Rhythm: Normal rate and regular rhythm.  Pulmonary:     Effort: Pulmonary effort is normal. No respiratory distress.     Comments: Faint wheezing bilaterally, rhonchi both lung fields, speaking in full sentences Abdominal:     General: There is no distension.     Tenderness: There is no abdominal tenderness.  Musculoskeletal:     Right lower leg: Edema present.     Left lower leg: Edema present.  Skin:    General: Skin is warm and dry.  Neurological:     General: No focal deficit present.     Mental Status: She is alert. Mental status is at baseline.  Psychiatric:        Mood and Affect: Mood normal.        Behavior: Behavior normal.     (all labs ordered are listed, but only abnormal results are displayed) Labs Reviewed  BASIC METABOLIC PANEL WITH GFR - Abnormal;  Notable for the following components:      Result Value   CO2 19 (*)    Glucose, Bld 106 (*)    Creatinine, Ser 1.33 (*)    GFR, Estimated 44 (*)    All other components within normal limits  CBC - Abnormal; Notable for the following components:   RBC 3.29 (*)    Hemoglobin 9.5 (*)    HCT 30.9 (*)    RDW 16.5 (*)    All other components within normal limits  BRAIN NATRIURETIC PEPTIDE - Abnormal; Notable for the following components:   B Natriuretic Peptide 673.3 (*)  All other components within normal limits  RESP PANEL BY RT-PCR (RSV, FLU A&B, COVID)  RVPGX2  HIV ANTIBODY (ROUTINE TESTING W REFLEX)  BASIC METABOLIC PANEL WITH GFR  CBC  CREATININE, SERUM  IRON AND TIBC  FERRITIN  OCCULT BLOOD X 1 CARD TO LAB, STOOL  RETICULOCYTES  VITAMIN B12  FOLATE  CBC WITH DIFFERENTIAL/PLATELET  POC OCCULT BLOOD, ED  TROPONIN I (HIGH SENSITIVITY)  TROPONIN I (HIGH SENSITIVITY)    EKG: EKG Interpretation Date/Time:  Sunday December 11 2023 12:58:43 EDT Ventricular Rate:  89 PR Interval:  160 QRS Duration:  74 QT Interval:  398 QTC Calculation: 484 R Axis:   82  Text Interpretation: Sinus rhythm with sinus arrhythmia with occasional Premature ventricular complexes Otherwise normal ECG When compared with ECG of 15-Aug-2023 15:30, PREVIOUS ECG IS PRESENT No significant changes from prior tracing Confirmed by Cottie Cough [phone]) on 12/11/2023 1:30:52 PM  Radiology: [facility] Chest 2 View Result Date: 12/11/2023 CLINICAL DATA:  Shortness of breath. EXAM: CHEST - 2 VIEW COMPARISON:  Chest radiograph dated 03/03/2023. FINDINGS: Mild cardiomegaly with mild vascular congestion. No focal consolidation, or pneumothorax. Small bilateral pleural effusions. No acute osseous pathology. Median sternotomy wires. IMPRESSION: Mild cardiomegaly with mild vascular congestion and small bilateral pleural effusions. Electronically Signed   By: Vanetta Chou M.D.   On: 12/11/2023 14:00     Procedures    Medications Ordered in the ED  acetaminophen  (TYLENOL ) tablet 1,000 mg (has no administration in time range)  atorvastatin  (LIPITOR) tablet 10 mg (has no administration in time range)  metoprolol  tartrate (LOPRESSOR ) tablet 50 mg (has no administration in time range)  dapagliflozin  propanediol (FARXIGA ) tablet 10 mg (has no administration in time range)  insulin  glargine (2 Unit Dial ) (TOUJEO  MAX) Solostar Pen SOPN 40 Units (has no administration in time range)  pantoprazole  (PROTONIX ) EC tablet 40 mg (has no administration in time range)  cyanocobalamin  (VITAMIN B12) tablet 1,000 mcg (has no administration in time range)  gabapentin  (NEURONTIN ) capsule 300 mg (has no administration in time range)  sodium chloride  flush (NS) 0.9 % injection 3 mL (has no administration in time range)  sodium chloride  flush (NS) 0.9 % injection 3 mL (has no administration in time range)  0.9 %  sodium chloride  infusion (has no administration in time range)  ondansetron  (ZOFRAN ) injection 4 mg (has no administration in time range)  enoxaparin  (LOVENOX ) injection 40 mg (has no administration in time range)  furosemide  (LASIX ) injection 60 mg (has no administration in time range)  ipratropium-albuterol  (DUONEB) 0.5-2.5 (3) MG/3ML nebulizer solution 3 mL (3 mLs Nebulization Given 12/11/23 1501)  furosemide  (LASIX ) injection 80 mg (80 mg Intravenous Given 12/11/23 1501)    Clinical Course as of 12/11/23 1900  Sun Dec 11, 2023  1824 I spoke to Dr Fontaine cardiology who recommends anemia workup to rule out all other potential causes of anemia - if nothing else, patient may need to be switched back to tildafinil.  This was discussed with the hospitalist who will admit for anemia/iron studies and workup, hgb trending. [MT]    Clinical Course User Index [MT] Marcha Licklider, Cough PARAS, MD                                 Medical Decision Making Amount and/or Complexity of Data Reviewed Labs: ordered. Radiology:  ordered.  Risk Prescription drug management. Decision regarding hospitalization.   This patient presents to the ED  with concern for generalized fatigue, low energy, shortness of breath. This involves an extensive number of treatment options, and is a complaint that carries with it a high risk of complications and morbidity.  The differential diagnosis includes anemia versus pneumonia versus viral illness versus congestive heart failure versus other  Co-morbidities that complicate the patient evaluation: History of congestive heart failure  External records from outside source obtained and reviewed including outpatient right heart catheterization report and echocardiogram  I ordered and personally interpreted labs.  The pertinent results include: Anemia, normocytic, hemoglobin 9.5, 3 g drop from 5 months ago last check.  BNP near baseline level at 673  I ordered imaging studies including x-ray of the chest I independently visualized and interpreted imaging which showed some pulmonary congestion and cardiomegaly I agree with the radiologist interpretation  The patient was maintained on a cardiac monitor.  I personally viewed and interpreted the cardiac monitored which showed an underlying rhythm of: NSR  Per my interpretation the patient's ECG shows no acute ischemia  I ordered medication including duoneb and lasix  for pulm edema and reactive airway disease  I have reviewed the patients home medicines and have made adjustments as needed  Test Considered: doubt acute PE  After the interventions noted above, I reevaluated the patient and found that they have: improved - minor subjective improvement; still moving air poorly (limited perhaps by habitus); no hypoxia or tachypnea  Social Determinants of Health:smoking cessation  See Ed course for conversation with cardiologist. Opsynvi may be associated with anemia.  If no other clear cause of anemia emerges, pt may need to be switched off  this medication.  Dispostion:  After consideration of the diagnostic results and the patients response to treatment, I feel that the patent would benefit from medical admission      Final diagnoses:  Anemia, unspecified type  Other fatigue    ED Discharge Orders     None          Cottie Donnice PARAS, MD 12/11/23 1900

## 2023-12-11 NOTE — ED Notes (Signed)
 Pt amb to RR for urine occurrence

## 2023-12-12 ENCOUNTER — Observation Stay (HOSPITAL_BASED_OUTPATIENT_CLINIC_OR_DEPARTMENT_OTHER)

## 2023-12-12 DIAGNOSIS — I2781 Cor pulmonale (chronic): Secondary | ICD-10-CM | POA: Diagnosis not present

## 2023-12-12 DIAGNOSIS — I10 Essential (primary) hypertension: Secondary | ICD-10-CM | POA: Diagnosis not present

## 2023-12-12 DIAGNOSIS — I5021 Acute systolic (congestive) heart failure: Secondary | ICD-10-CM | POA: Diagnosis not present

## 2023-12-12 DIAGNOSIS — I5033 Acute on chronic diastolic (congestive) heart failure: Secondary | ICD-10-CM | POA: Diagnosis not present

## 2023-12-12 DIAGNOSIS — E669 Obesity, unspecified: Secondary | ICD-10-CM

## 2023-12-12 DIAGNOSIS — D649 Anemia, unspecified: Secondary | ICD-10-CM | POA: Diagnosis not present

## 2023-12-12 LAB — CBC WITH DIFFERENTIAL/PLATELET
Abs Immature Granulocytes: 0.01 K/uL (ref 0.00–0.07)
Basophils Absolute: 0 K/uL (ref 0.0–0.1)
Basophils Relative: 1 %
Eosinophils Absolute: 0.2 K/uL (ref 0.0–0.5)
Eosinophils Relative: 3 %
HCT: 30 % — ABNORMAL LOW (ref 36.0–46.0)
Hemoglobin: 9.4 g/dL — ABNORMAL LOW (ref 12.0–15.0)
Immature Granulocytes: 0 %
Lymphocytes Relative: 29 %
Lymphs Abs: 1.5 K/uL (ref 0.7–4.0)
MCH: 28.7 pg (ref 26.0–34.0)
MCHC: 31.3 g/dL (ref 30.0–36.0)
MCV: 91.7 fL (ref 80.0–100.0)
Monocytes Absolute: 0.5 K/uL (ref 0.1–1.0)
Monocytes Relative: 10 %
Neutro Abs: 3 K/uL (ref 1.7–7.7)
Neutrophils Relative %: 57 %
Platelets: 248 K/uL (ref 150–400)
RBC: 3.27 MIL/uL — ABNORMAL LOW (ref 3.87–5.11)
RDW: 16.5 % — ABNORMAL HIGH (ref 11.5–15.5)
WBC: 5.3 K/uL (ref 4.0–10.5)
nRBC: 0 % (ref 0.0–0.2)

## 2023-12-12 LAB — ECHOCARDIOGRAM COMPLETE
Area-P 1/2: 4.6 cm2
Height: 65 in
S' Lateral: 3.2 cm
Weight: 3647.29 [oz_av]

## 2023-12-12 LAB — GLUCOSE, CAPILLARY
Glucose-Capillary: 52 mg/dL — ABNORMAL LOW (ref 70–99)
Glucose-Capillary: 96 mg/dL (ref 70–99)
Glucose-Capillary: 109 mg/dL — ABNORMAL HIGH (ref 70–99)
Glucose-Capillary: 123 mg/dL — ABNORMAL HIGH (ref 70–99)
Glucose-Capillary: 89 mg/dL (ref 70–99)
Glucose-Capillary: 147 mg/dL — ABNORMAL HIGH (ref 70–99)

## 2023-12-12 LAB — BASIC METABOLIC PANEL WITH GFR
Anion gap: 6 (ref 5–15)
BUN: 15 mg/dL (ref 8–23)
CO2: 20 mmol/L — ABNORMAL LOW (ref 22–32)
Calcium: 8.9 mg/dL (ref 8.9–10.3)
Chloride: 114 mmol/L — ABNORMAL HIGH (ref 98–111)
Creatinine, Ser: 1.38 mg/dL — ABNORMAL HIGH (ref 0.44–1.00)
GFR, Estimated: 42 mL/min — ABNORMAL LOW (ref >60)
Glucose, Bld: 66 mg/dL — ABNORMAL LOW (ref 70–99)
Potassium: 3.6 mmol/L (ref 3.5–5.1)
Sodium: 140 mmol/L (ref 135–145)

## 2023-12-12 MED ORDER — FUROSEMIDE 40 MG PO TABS: 40.0000 mg | ORAL_TABLET | ORAL | Status: DC

## 2023-12-12 MED ORDER — FUROSEMIDE 10 MG/ML IJ SOLN
60.0000 mg | Freq: Once | INTRAMUSCULAR | Status: AC
  Administered 2023-12-12: 60 mg via INTRAVENOUS
  Filled 2023-12-12: qty 6

## 2023-12-12 MED ORDER — FUROSEMIDE 40 MG PO TABS
80.0000 mg | ORAL_TABLET | Freq: Every day | ORAL | Status: DC
  Administered 2023-12-13: 80 mg via ORAL
  Filled 2023-12-12: qty 2

## 2023-12-12 NOTE — Progress Notes (Signed)
 Heart Failure Navigator Progress Note  Assessed for Heart & Vascular TOC clinic readiness.  Patient does not meet criteria due to Advanced Heart Failure Team patient of Dr. Gala Romney. .   Navigator will sign off at this time.   Rhae Hammock, BSN, Scientist, clinical (histocompatibility and immunogenetics) Only

## 2023-12-12 NOTE — Evaluation (Signed)
 Occupational Therapy Evaluation Patient Details Name: Deborah Coffey MRN: [id number] DOB: Jan 22, 1956 Today's Date: 12/12/2023   History of Present Illness   68 y.o. female presented to ED with a complaint of shortness of breath and leg swelling, Pt with anemia. PMH significant of CHF, RHC March 2025 noting severe PAH with cor pulmonale, type 2 diabetes mellitus, hypertension     Clinical Impressions Pt resting in bed comfortably, c/o fatigue, but able to fully participate in therapy. Pt live with daughter and two grandchildren, daughter works 3rd shift, sleeps during the day. PLOF independent with cane. Pt currently doing well overall, set up/supervision for ADLs, ambulated short distance in room with RW for support at supervision level. Pt would benefit from continued acute OT to maximize activity tolerance, no OT follow up expected. Pt would benefit from RW for return home.      If plan is discharge home, recommend the following:   A little help with walking and/or transfers;A little help with bathing/dressing/bathroom;Assist for transportation;Assistance with cooking/housework     Functional Status Assessment   Patient has had a recent decline in their functional status and demonstrates the ability to make significant improvements in function in a reasonable and predictable amount of time.     Equipment Recommendations   Other (comment) (RW)     Recommendations for Other Services         Precautions/Restrictions   Precautions Precautions: Fall Restrictions Weight Bearing Restrictions Per Provider Order: No     Mobility Bed Mobility Overal bed mobility: Modified Independent                  Transfers Overall transfer level: Needs assistance Equipment used: Rolling walker (2 wheels) Transfers: Sit to/from Stand Sit to Stand: Supervision           General transfer comment: supervision for safety with RW      Balance Overall balance  assessment: Needs assistance Sitting-balance support: No upper extremity supported, Feet supported Sitting balance-Leahy Scale: Good     Standing balance support: During functional activity Standing balance-Leahy Scale: Fair Standing balance comment: RW for support, uses cane at baseline                           ADL either performed or assessed with clinical judgement   ADL Overall ADL's : Needs assistance/impaired                                       General ADL Comments: set up/supervision, decreased activity tolerance but able to complete ADLs without physical assistance     Vision Baseline Vision/History: 1 Wears glasses Ability to See in Adequate Light: 0 Adequate Patient Visual Report: Other (comment) (reports floaters off/on recently)       Perception         Praxis         Pertinent Vitals/Pain Pain Assessment Pain Assessment: No/denies pain     Extremity/Trunk Assessment Upper Extremity Assessment Upper Extremity Assessment: Overall WFL for tasks assessed           Communication Communication Communication: No apparent difficulties   Cognition Arousal: Alert Behavior During Therapy: WFL for tasks assessed/performed Cognition: No apparent impairments             OT - Cognition Comments: A/Ox4  Following commands: Intact       Cueing  General Comments   Cueing Techniques: Verbal cues      Exercises     Shoulder Instructions      Home Living Family/patient expects to be discharged to:: Private residence Living Arrangements: Children Available Help at Discharge: Family;Available PRN/intermittently Type of Home: Apartment Home Access: Level entry     Home Layout: One level     Bathroom Shower/Tub: Tub/shower unit         Home Equipment: Cane - single point   Additional Comments: Pt lives with daughter who works at night, sleeps during the day, two grandkids who go to  school      Prior Functioning/Environment Prior Level of Function : Independent/Modified Independent             Mobility Comments: uses cane when gout flares up, no falls ADLs Comments: uses rubber shoes for shower    OT Problem List: Decreased activity tolerance;Impaired balance (sitting and/or standing);Obesity;Increased edema   OT Treatment/Interventions: Self-care/ADL training;Therapeutic exercise;DME and/or AE instruction;Energy conservation;Balance training;Patient/family education;Therapeutic activities      OT Goals(Current goals can be found in the care plan section)   Acute Rehab OT Goals Patient Stated Goal: to return home, improve activity tolerance OT Goal Formulation: With patient Time For Goal Achievement: 12/26/23 Potential to Achieve Goals: Good   OT Frequency:  Min 2X/week    Co-evaluation              AM-PAC OT 6 Clicks Daily Activity     Outcome Measure Help from another person eating meals?: None Help from another person taking care of personal grooming?: A Little Help from another person toileting, which includes using toliet, bedpan, or urinal?: A Little Help from another person bathing (including washing, rinsing, drying)?: A Little Help from another person to put on and taking off regular upper body clothing?: A Little Help from another person to put on and taking off regular lower body clothing?: A Little 6 Click Score: 19   End of Session Equipment Utilized During Treatment: Gait belt;Rolling walker (2 wheels) Nurse Communication: Mobility status  Activity Tolerance: Patient tolerated treatment well Patient left: in bed;with call bell/phone within reach  OT Visit Diagnosis: Unsteadiness on feet (R26.81);Other abnormalities of gait and mobility (R26.89);Other (comment) (decreased activity tolerance)                Time: 1510-1539 OT Time Calculation (min): 29 min Charges:  OT General Charges $OT Visit: 1 Visit OT  Evaluation $OT Eval Low Complexity: 1 Low OT Treatments $Self Care/Home Management : 8-22 mins  [address] OTR/L   Elouise JONELLE Bott 12/12/2023, 3:45 PM

## 2023-12-12 NOTE — Evaluation (Signed)
 Physical Therapy Evaluation Patient Details Name: Deborah Coffey MRN: [id number] DOB: [birth year] Today's Date: 12/12/2023  History of Present Illness  Pt is a 68 y/o F admitted on 12/11/23 after presenting with c/o SOB & LE swelling. Pt is being treated for DOE 2/2 CHF vs anemia. PMH: CHF, RHC March 2025, DM2, HTN, anemia, depression, TIA  Clinical Impression  Pt seen for PT evaluation with pt agreeable. Pt reports prior to admission she was ambulatory without AD, PRN use of QC, denies falls. On this date, pt is able to complete bed mobility with mod I with HOB elevated. Pt ambulates in hallway with RW & CGA. Pt presents with generalized deconditioning & balance & would benefit from ongoing PT services to progress mobility as able.        If plan is discharge home, recommend the following: A little help with walking and/or transfers;A little help with bathing/dressing/bathroom;Assistance with cooking/housework;Assist for transportation   Can travel by private vehicle        Equipment Recommendations Rolling walker (2 wheels)  Recommendations for Other Services       Functional Status Assessment Patient has had a recent decline in their functional status and demonstrates the ability to make significant improvements in function in a reasonable and predictable amount of time.     Precautions / Restrictions Precautions Precautions: Fall Restrictions Weight Bearing Restrictions Per Provider Order: No      Mobility  Bed Mobility Overal bed mobility: Modified Independent Bed Mobility: Supine to Sit, Sit to Supine     Supine to sit: Modified independent (Device/Increase time), HOB elevated, Used rails Sit to supine: HOB elevated, Modified independent (Device/Increase time), Used rails (extra time to elevate BLE onto bed)        Transfers Overall transfer level: Needs assistance Equipment used: Rolling walker (2 wheels) Transfers: Sit to/from Stand Sit to Stand: Min  assist           General transfer comment: mild posterior LOB with assistance to correct    Ambulation/Gait Ambulation/Gait assistance: Contact guard assist Gait Distance (Feet): 150 Feet   Gait Pattern/deviations: Decreased step length - right, Decreased step length - left, Decreased stride length Gait velocity: decreased        Stairs            Wheelchair Mobility     Tilt Bed    Modified Rankin (Stroke Patients Only)       Balance Overall balance assessment: Needs assistance Sitting-balance support: No upper extremity supported, Feet supported Sitting balance-Leahy Scale: Good Sitting balance - Comments: dons socks sitting EOB without LOB   Standing balance support: Bilateral upper extremity supported, Reliant on assistive device for balance, During functional activity Standing balance-Leahy Scale: Fair                               Pertinent Vitals/Pain Pain Assessment Pain Assessment: No/denies pain    Home Living Family/patient expects to be discharged to:: Private residence Living Arrangements: Children Available Help at Discharge: Family;Available PRN/intermittently Type of Home: Apartment Home Access: Level entry       Home Layout: One level Home Equipment: Cane - quad Additional Comments: Lives with daughter & 3 grandkids (17, 36, and 26 y/o)    Prior Function Prior Level of Function : Independent/Modified Independent             Mobility Comments: uses cane when gout flares up, no falls ADLs Comments:  uses rubber shoes for shower     Extremity/Trunk Assessment   Upper Extremity Assessment Upper Extremity Assessment: Overall WFL for tasks assessed    Lower Extremity Assessment Lower Extremity Assessment: Overall WFL for tasks assessed;Generalized weakness    Cervical / Trunk Assessment Cervical / Trunk Assessment: Normal  Communication   Communication Communication: No apparent difficulties    Cognition  Arousal: Alert Behavior During Therapy: WFL for tasks assessed/performed   PT - Cognitive impairments: No apparent impairments                         Following commands: Intact       Cueing Cueing Techniques: Verbal cues     General Comments General comments (skin integrity, edema, etc.): VSS throughout    Exercises     Assessment/Plan    PT Assessment Patient needs continued PT services  PT Problem List Decreased strength;Cardiopulmonary status limiting activity;Decreased activity tolerance;Decreased balance;Decreased mobility;Decreased knowledge of use of DME;Decreased knowledge of precautions       PT Treatment Interventions DME instruction;Balance training;Gait training;Neuromuscular re-education;Stair training;Functional mobility training;Therapeutic activities;Therapeutic exercise;Patient/family education;Wheelchair mobility training    PT Goals (Current goals can be found in the Care Plan section)  Acute Rehab PT Goals Patient Stated Goal: get better PT Goal Formulation: With patient Time For Goal Achievement: 12/26/23 Potential to Achieve Goals: Good    Frequency Min 2X/week     Co-evaluation               AM-PAC PT 6 Clicks Mobility  Outcome Measure Help needed turning from your back to your side while in a flat bed without using bedrails?: None Help needed moving from lying on your back to sitting on the side of a flat bed without using bedrails?: A Little Help needed moving to and from a bed to a chair (including a wheelchair)?: A Little Help needed standing up from a chair using your arms (e.g., wheelchair or bedside chair)?: A Little Help needed to walk in hospital room?: A Little Help needed climbing 3-5 steps with a railing? : A Little 6 Click Score: 19    End of Session   Activity Tolerance: Patient tolerated treatment well;Patient limited by fatigue Patient left: in bed;with call bell/phone within reach;with bed alarm set   PT  Visit Diagnosis: Muscle weakness (generalized) (M62.81);Other abnormalities of gait and mobility (R26.89);Unsteadiness on feet (R26.81)    Time: 8560-8543 PT Time Calculation (min) (ACUTE ONLY): 17 min   Charges:   PT Evaluation $PT Eval Low Complexity: 1 Low   PT General Charges $$ ACUTE PT VISIT: 1 Visit         Richerd Pinal, PT, DPT 12/12/23, 4:04 PM   Richerd CHRISTELLA Pinal 12/12/2023, 4:03 PM

## 2023-12-12 NOTE — Inpatient Diabetes Management (Signed)
 Inpatient Diabetes Program Recommendations  AACE/ADA: New Consensus Statement on Inpatient Glycemic Control (2015)  Target Ranges:  Prepandial:   less than 140 mg/dL      Peak postprandial:   less than 180 mg/dL (1-2 hours)      Critically ill patients:  140 - 180 mg/dL    Latest Reference Range & Units 12/11/23 20:14  Hemoglobin A1C 4.8 - 5.6 % 6.1 (H)  (H): Data is abnormally high  Latest Reference Range & Units 12/11/23 20:35 12/12/23 05:21 12/12/23 05:57 12/12/23 08:28  Glucose-Capillary 70 - 99 mg/dL 71  40 units Lantus  @2215  52 (L) 96  10 mg Farxiga  109 (H)  (L): Data is abnormally low (H): Data is abnormally high   Admit with:  SOB Dyspnea on exertion/acute on chronic congestive heart failure with preserved ejection fraction/pulmonary artery hypertension with cor pulmonale/acute anemia   History: DM2, CHF  Home DM Meds: Dexcom G6 CGM       Farxiga  10 mg daily       Metformin  500 mg BID       Toujeo  40 units daily  Current Orders: Novolog  Moderate Correction Scale/ SSI (0-15 units) TID AC + HS     Lantus  40 units daily     Farxiga  10 mg daily    MD- Note Hypoglycemia this AM after getting 40 units Lantus  last PM  Please consider reducing the Lantus  to 20 units daily    --Will follow patient during hospitalization--  Adina Rudolpho Arrow RN, MSN, CDCES Diabetes Coordinator Inpatient Glycemic Control Team Team Pager: [phone] (8a-5p)

## 2023-12-12 NOTE — Progress Notes (Signed)
  Echocardiogram 2D Echocardiogram has been performed.  Koleen KANDICE Popper, RDCS 12/12/2023, 10:35 AM

## 2023-12-12 NOTE — TOC Initial Note (Signed)
 Transition of Care [facility]) - Initial/Assessment Note    Patient Details  Name: Deborah Coffey MRN: [id number] Date of Birth: [birth year]  Transition of Care [facility]) CM/SW Contact:    Lendia Dais, LCSWA Phone Number: 12/12/2023, 4:18 PM  Clinical Narrative: CSW spoke to patient with the daughter and grand children at bedside. Pt is from home and is independent with ADL's. Pt is able to drive but daughter provides transportation to appointments. Pt has no concerns with SDOH's.   CSW will continue to monitor.                    Expected Discharge Plan: Home w Home Health Services Barriers to Discharge: Continued Medical Work up   Patient Goals and CMS Choice Patient states their goals for this hospitalization and ongoing recovery are:: Going home   Choice offered to / list presented to : NA      Expected Discharge Plan and Services In-house Referral: Clinical Social Work     Living arrangements for the past 2 months: Single Family Home                                      Prior Living Arrangements/Services Living arrangements for the past 2 months: Single Family Home Lives with:: Adult Children, Relatives Patient language and need for interpreter reviewed:: Yes Do you feel safe going back to the place where you live?: Yes      Need for Family Participation in Patient Care: Yes (Comment) Care giver support system in place?: No (comment)   Criminal Activity/Legal Involvement Pertinent to Current Situation/Hospitalization: No - Comment as needed  Activities of Daily Living   ADL Screening (condition at time of admission) Independently performs ADLs?: Yes (appropriate for developmental age) Is the patient deaf or have difficulty hearing?: No Does the patient have difficulty seeing, even when wearing glasses/contacts?: No Does the patient have difficulty concentrating, remembering, or making decisions?: No  Permission Sought/Granted Permission sought to  share information with : Family Supports Permission granted to share information with : Yes, Verbal Permission Granted  Share Information with NAME: Zada Single     Permission granted to share info w Relationship: Daughter  Permission granted to share info w Contact Information: [phone]  Emotional Assessment Appearance:: Appears stated age Attitude/Demeanor/Rapport: Engaged Affect (typically observed): Appropriate, Pleasant Orientation: : Oriented to Self, Oriented to Place, Oriented to  Time, Oriented to Situation Alcohol / Substance Use: Not Applicable Psych Involvement: No (comment)  Admission diagnosis:  Anemia [D64.9] Other fatigue [R53.83] Anemia, unspecified type [D64.9] Patient Active Problem List   Diagnosis Date Noted   Anemia 12/11/2023   Restrictive lung disease 08/22/2023   Respiratory bronchiolitis interstitial lung disease (HCC) 03/25/2023   Lung nodule 03/25/2023   Gout 05/03/2022   Morbid obesity (HCC) 05/03/2022   Atherosclerotic heart disease of native coronary artery with other forms of angina pectoris (HCC) 05/03/2022   Obstructive sleep apnea 08/31/2021   Pulmonary hypertension (HCC) 04/10/2021   Facial numbness 09/03/2016   Chronic diastolic CHF (congestive heart failure) (HCC) 09/03/2016   Anxiety    Eczema 01/29/2016   History of adenomatous polyp of colon 10/01/2015   Floaters in visual field 09/04/2015   DOE (dyspnea on exertion) 06/26/2015   Screening for colon cancer 06/26/2015   Type 2 diabetes mellitus with complication, with long-term current use of insulin  (HCC) 02/27/2015   Diabetic mononeuropathy associated with  type 2 diabetes mellitus (HCC) 02/27/2015   LLQ abdominal pain    AKI (acute kidney injury) (HCC) 08/07/2014   Type 2 diabetes mellitus with hyperglycemia (HCC) 07/08/2014   Preventative health care 07/08/2014   Depression 07/08/2014   Essential hypertension 01/10/2014   Depression 01/10/2014   H/O TIA (transient ischemic  attack) and stroke 01/10/2014   Visual changes 01/02/2014   Facial weakness 01/02/2014   Conversion disorder 01/01/2014   Tobacco use 06/20/2013   Diabetic retinopathy (HCC) 04/03/2013   Essential hypertension, benign 02/14/2013   Accelerated hypertension 09/19/2012   Uncontrolled diabetes mellitus 09/19/2012   Dyslipidemia 09/19/2012   CAD (coronary artery disease) - CABG x 1 (LIMA-LAD) 02/21/2004   PCP:  [name], MD Pharmacy:   [facility] [id number]), [place] - [address] [place] ([place]) [place] [address] Phone: [phone] Fax: [phone]  [facility] - [place], [place] - [address] [place] [address] Phone: [phone] Fax: [phone]     Social Drivers of Health (SDOH) Social History: SDOH Screenings   Food Insecurity: No Food Insecurity (12/12/2023)  Housing: Low Risk  (12/12/2023)  Transportation Needs: No Transportation Needs (12/12/2023)  Utilities: Not At Risk (12/12/2023)  Alcohol Screen: Low Risk  (08/23/2023)  Depression (PHQ2-9): Low Risk  (08/23/2023)  Financial Resource Strain: Low Risk  (08/23/2023)  Physical Activity: Inactive (08/23/2023)  Social Connections: Unknown (12/12/2023)  Stress: No Stress Concern Present (08/23/2023)  Tobacco Use: High Risk (12/11/2023)  Health Literacy: Adequate Health Literacy (08/23/2023)   SDOH Interventions:     Readmission Risk Interventions     No data to display

## 2023-12-12 NOTE — Progress Notes (Signed)
 Progress Note   Patient: Deborah Coffey FMW:[id number] DOB: [birth year] DOA: 12/11/2023     0 DOS: the patient was seen and examined on 12/12/2023   Brief hospital course: Deborah Coffey is a 68 y.o. female with medical history significant of CHF, RHC March 2025 noting severe PAH with cor pulmonale, type 2 diabetes mellitus, hypertension presented to ED with a complaint of shortness of breath and leg swelling.  BNP 673.3, chest x-ray showed cardiomegaly, vascular congestion.  Admitted to TRH service for further management evaluation of CHF exacerbation.  Assessment and Plan: Acute on chronic CHF with preserved ejection fraction Pulmonary artery hypertension Cor pulmonale- Possibly causing worsening shortness of breath. Patient got IV Lasix  in the emergency department with improvement of symptoms. Will give IV Lasix  60 mg today, will start home dose from tomorrow. She is diuresing well. Monitor daily weights, strict input, output, daily renal function, electrolytes. Heart failure team follow-up as outpatient. Possible discharge tomorrow AM.  Acute on chronic anemia: Patient's baseline hemoglobin around 11-12. Presented with hemoglobin 9.5.  Iron panel, B12 and folate unremarkable. FOBT x 1 negative.  No active bleeding.  Will continue to monitor H&H.  Type 2 diabetes mellitus: A1c 6.8. Continue Lantus  40 units, Farxiga . Continue Accu-Cheks, sliding scale insulin .  Essential hypertension: Resume metoprolol .  Hyperlipidemia: Continue atorvastatin .  CKD stage IIIb: Creatinine at baseline.  GERD continue PPI.  Obesity Class II- BMI 37.93 Diet, exercise and weight reduction advised.     Out of bed to chair. Incentive spirometry. Nursing supportive care. Fall, aspiration precautions. Diet:  Diet Orders (From admission, onward)     Start     Ordered   12/11/23 1858  Diet 2 gram sodium Room service appropriate? Yes; Fluid consistency: Thin  Diet effective now        Question Answer Comment  Room service appropriate? Yes   Fluid consistency: Thin      12/11/23 1859           DVT prophylaxis: enoxaparin  (LOVENOX ) injection 40 mg Start: 12/11/23 1900  Level of care: Telemetry Medical   Code Status: Limited: Do not attempt resuscitation (DNR) -DNR-LIMITED -Do Not Intubate/DNI   Subjective: Patient is seen and examined today morning. She is lying in bed, feels short of breath with exertion. No chest pain. Denies any rectal bleeding. Advised out of bed.  Physical Exam: Vitals:   12/12/23 0002 12/12/23 0401 12/12/23 0831 12/12/23 1618  BP: (!) 115/55 119/61 113/64 (!) 109/56  Pulse: 67 67 77 60  Resp: 20 18 18 19   Temp: 98.2 F (36.8 C) 98.1 F (36.7 C) 98.1 F (36.7 C) 98.6 F (37 C)  TempSrc: Oral Oral    SpO2: 96% 94% 98% 93%  Weight:  103.4 kg    Height:        General - Elderly African American female, no apparent distress HEENT - PERRLA, EOMI, atraumatic head, non tender sinuses. Lung -distant breath sounds, basal rales, diffuse rhonchi, no wheezes. Heart - S1, S2 heard, no murmurs, rubs, trace pedal edema. Abdomen - Soft, non tender, obese, bowel sounds good Neuro - Alert, awake and oriented x 3, non focal exam. Skin - Warm and dry.  Data Reviewed:      Latest Ref Rng & Units 12/12/2023    5:37 AM 12/11/2023    8:14 PM 12/11/2023    1:13 PM  CBC  WBC 4.0 - 10.5 K/uL 5.3  5.4  4.9   Hemoglobin 12.0 - 15.0 g/dL 9.4  9.5  9.5   Hematocrit 36.0 - 46.0 % 30.0  30.4  30.9   Platelets 150 - 400 K/uL 248  239  252       Latest Ref Rng & Units 12/12/2023    5:37 AM 12/11/2023    8:14 PM 12/11/2023    1:13 PM  BMP  Glucose 70 - 99 mg/dL 66   893   BUN 8 - 23 mg/dL 15   16   Creatinine 9.55 - 1.00 mg/dL 8.61  8.61  8.66   Sodium 135 - 145 mmol/L 140   140   Potassium 3.5 - 5.1 mmol/L 3.6   3.7   Chloride 98 - 111 mmol/L 114   110   CO2 22 - 32 mmol/L 20   19   Calcium  8.9 - 10.3 mg/dL 8.9   9.3    DG Chest 2 View Result  Date: 12/11/2023 CLINICAL DATA:  Shortness of breath. EXAM: CHEST - 2 VIEW COMPARISON:  Chest radiograph dated 03/03/2023. FINDINGS: Mild cardiomegaly with mild vascular congestion. No focal consolidation, or pneumothorax. Small bilateral pleural effusions. No acute osseous pathology. Median sternotomy wires. IMPRESSION: Mild cardiomegaly with mild vascular congestion and small bilateral pleural effusions. Electronically Signed   By: Vanetta Chou M.D.   On: 12/11/2023 14:00    Family Communication: Discussed with patient, she understand and agree. All questions answered.  Disposition: Status is: Observation The patient remains OBS appropriate and will d/c before 2 midnights.  Planned Discharge Destination: Home with Home Health     Time spent:45  minutes  Author: Concepcion Riser, MD 12/12/2023 5:25 PM Secure chat 7am to 7pm For on call review [email or web address].

## 2023-12-12 NOTE — Plan of Care (Signed)
   Problem: Education: Goal: Ability to describe self-care measures that may prevent or decrease complications (Diabetes Survival Skills Education) will improve Outcome: Progressing Goal: Individualized Educational Video(s) Outcome: Progressing

## 2023-12-12 NOTE — Care Management Obs Status (Signed)
 MEDICARE OBSERVATION STATUS NOTIFICATION   Patient Details  Name: Deborah Coffey MRN: [id number] Date of Birth: 06-22-1955   Medicare Observation Status Notification Given:  Yes Obs letter signed and copy given    Claretta Deed 12/12/2023, 1:42 PM

## 2023-12-12 NOTE — Plan of Care (Signed)

## 2023-12-13 DIAGNOSIS — I5033 Acute on chronic diastolic (congestive) heart failure: Secondary | ICD-10-CM | POA: Diagnosis not present

## 2023-12-13 DIAGNOSIS — I10 Essential (primary) hypertension: Secondary | ICD-10-CM | POA: Diagnosis not present

## 2023-12-13 DIAGNOSIS — E669 Obesity, unspecified: Secondary | ICD-10-CM | POA: Diagnosis not present

## 2023-12-13 DIAGNOSIS — D649 Anemia, unspecified: Secondary | ICD-10-CM | POA: Diagnosis not present

## 2023-12-13 DIAGNOSIS — I272 Pulmonary hypertension, unspecified: Secondary | ICD-10-CM | POA: Diagnosis not present

## 2023-12-13 DIAGNOSIS — I2781 Cor pulmonale (chronic): Secondary | ICD-10-CM | POA: Diagnosis not present

## 2023-12-13 LAB — HEPATIC FUNCTION PANEL
ALT: 15 U/L (ref 0–44)
AST: 22 U/L (ref 15–41)
Albumin: 3.1 g/dL — ABNORMAL LOW (ref 3.5–5.0)
Alkaline Phosphatase: 84 U/L (ref 38–126)
Bilirubin, Direct: <0.1 mg/dL (ref 0.0–0.2)
Total Bilirubin: 0.6 mg/dL (ref 0.0–1.2)
Total Protein: 6.8 g/dL (ref 6.5–8.1)

## 2023-12-13 LAB — BASIC METABOLIC PANEL WITH GFR
Anion gap: 10 (ref 5–15)
BUN: 24 mg/dL — ABNORMAL HIGH (ref 8–23)
CO2: 23 mmol/L (ref 22–32)
Calcium: 9 mg/dL (ref 8.9–10.3)
Chloride: 105 mmol/L (ref 98–111)
Creatinine, Ser: 1.6 mg/dL — ABNORMAL HIGH (ref 0.44–1.00)
GFR, Estimated: 35 mL/min — ABNORMAL LOW (ref >60)
Glucose, Bld: 97 mg/dL (ref 70–99)
Potassium: 4 mmol/L (ref 3.5–5.1)
Sodium: 138 mmol/L (ref 135–145)

## 2023-12-13 LAB — GLUCOSE, CAPILLARY
Glucose-Capillary: 62 mg/dL — ABNORMAL LOW (ref 70–99)
Glucose-Capillary: 88 mg/dL (ref 70–99)
Glucose-Capillary: 86 mg/dL (ref 70–99)
Glucose-Capillary: 95 mg/dL (ref 70–99)
Glucose-Capillary: 151 mg/dL — ABNORMAL HIGH (ref 70–99)

## 2023-12-13 LAB — MAGNESIUM: Magnesium: 2.3 mg/dL (ref 1.7–2.4)

## 2023-12-13 MED ORDER — FUROSEMIDE 40 MG PO TABS: 60.0000 mg | ORAL_TABLET | Freq: Two times a day (BID) | ORAL | Status: DC

## 2023-12-13 MED ORDER — FUROSEMIDE 10 MG/ML IJ SOLN
80.0000 mg | Freq: Once | INTRAMUSCULAR | Status: AC
  Administered 2023-12-13: 80 mg via INTRAVENOUS
  Filled 2023-12-13: qty 8

## 2023-12-13 MED ORDER — TADALAFIL 20 MG PO TABS
40.0000 mg | ORAL_TABLET | Freq: Every day | ORAL | Status: DC
  Administered 2023-12-13 – 2023-12-15 (×3): 40 mg via ORAL
  Filled 2023-12-13 (×3): qty 2

## 2023-12-13 MED ORDER — MACITENTAN 10 MG PO TABS
10.0000 mg | ORAL_TABLET | Freq: Every day | ORAL | Status: DC
  Administered 2023-12-13 – 2023-12-15 (×3): 10 mg via ORAL
  Filled 2023-12-13 (×3): qty 1

## 2023-12-13 MED ORDER — POTASSIUM CHLORIDE CRYS ER 20 MEQ PO TBCR
40.0000 meq | EXTENDED_RELEASE_TABLET | Freq: Once | ORAL | Status: AC
  Administered 2023-12-13: 40 meq via ORAL
  Filled 2023-12-13: qty 2

## 2023-12-13 NOTE — Progress Notes (Signed)
 Monitoring by Pharmacy for Pulmonary Hypertension Treatment   Indication - Continuation of prior to admission medication   Patient is 68 y.o.  with history of PAH on chronic macitentan  (OPSUMIT ) PTA and will be continued while hospitalized.   Continuing this medication order as an inpatient requires that monitoring parameters per REMS requirements must be met.  Chronic therapy is under the supervision of Dr Bensimhon and is being notified of continuation of therapy. A staff message in EPIC has been sent notifying the prescriber.  Opsumit  is not longer part of the REMS program. Per patient report has previously been educated on Pregnancy risk and Hepatotoxicity.  On admission pregnancy risk has been assessed and Patient is female and cannot get pregnant (post-menopausal), and birth control option is appropriately not indicated as an option has been confirmed as birth control option for this patient.  Hepatic function has been evaluated. Ordering hepatic panel during this admission to evaluate and pharmacist will alert if any adjustments are needed.     Latest Ref Rng & Units 12/13/2023   11:22 AM 03/06/2023    5:19 PM 11/02/2022    3:00 PM  Hepatic Function  Total Protein 6.5 - 8.1 g/dL 6.8  6.8  7.4   Albumin 3.5 - 5.0 g/dL 3.1  3.1  4.2   AST 15 - 41 U/L 22  34  37   ALT 0 - 44 U/L 15  29  34   Alk Phosphatase 38 - 126 U/L 84  144  184   Total Bilirubin 0.0 - 1.2 mg/dL 0.6  0.3  0.3   Bilirubin, Direct 0.0 - 0.2 mg/dL <9.8       If any question arise or pregnancy is identified during hospitalization, contact for bosentan: [phone]; macitentan : [phone]; ambrisentan: [phone].  Thank you for allowing pharmacy to participate in this patient's care,  Suzen Sour, PharmD, BCCCP Clinical Pharmacist  Phone: [phone] 12/13/2023 12:53 PM  Please check AMION for all [facility] phone numbers After 10:00 PM, call Main Pharmacy [phone]

## 2023-12-13 NOTE — TOC Progression Note (Signed)
 Transition of Care [facility]) - Progression Note    Patient Details  Name: Deborah Coffey MRN: [id number] Date of Birth: 1956-02-03  [facility]) CM/SW Contact  Justina Delcia Czar, RN Phone Number: [phone] 12/13/2023, 4:01 PM  Clinical Narrative:     Spoke to pt and states she lives at home with dtr. Gave permission to speak to dtr. States she will have dtr pick her up a scale for daily weights. Contacted [employer] for Rollator and 3n1 bedside commode for home.   Will schedule PCP follow up appt on day of dc.   Expected Discharge Plan: Home w Home Health Services Barriers to Discharge: Continued Medical Work up               Expected Discharge Plan and Services In-house Referral: Clinical Social Work Discharge Planning Services: CM Consult Post Acute Care Choice: Durable Medical Equipment Living arrangements for the past 2 months: Single Family Home                 DME Arranged: Walker rolling with seat, 3-N-1 DME Agency: [employer] Date DME Agency Contacted: 12/13/23 Time DME Agency Contacted: 1600 Representative spoke with at DME Agency: [place]             Social Drivers of Health (SDOH) Interventions SDOH Screenings   Food Insecurity: No Food Insecurity (12/12/2023)  Housing: Low Risk  (12/12/2023)  Transportation Needs: No Transportation Needs (12/12/2023)  Utilities: Not At Risk (12/12/2023)  Alcohol Screen: Low Risk  (08/23/2023)  Depression (PHQ2-9): Low Risk  (08/23/2023)  Financial Resource Strain: Low Risk  (08/23/2023)  Physical Activity: Inactive (08/23/2023)  Social Connections: Unknown (12/12/2023)  Stress: No Stress Concern Present (08/23/2023)  Tobacco Use: High Risk (12/11/2023)  Health Literacy: Adequate Health Literacy (08/23/2023)    Readmission Risk Interventions     No data to display

## 2023-12-13 NOTE — Plan of Care (Signed)

## 2023-12-13 NOTE — Progress Notes (Signed)
 Progress Note   Patient: Deborah Coffey FMW:[id number] DOB: 1955/12/13 DOA: 12/11/2023     0 DOS: the patient was seen and examined on 12/13/2023   Brief hospital course: Deborah Coffey is a 68 y.o. female with medical history significant of CHF, RHC March 2025 noting severe PAH with cor pulmonale, type 2 diabetes mellitus, hypertension presented to ED with a complaint of shortness of breath and leg swelling.  BNP 673.3, chest x-ray showed cardiomegaly, vascular congestion.  Admitted to TRH service for further management evaluation of CHF exacerbation.  Assessment and Plan: Acute on chronic CHF with preserved ejection fraction Pulmonary artery hypertension with RV failure/ Cor pulmonale- Possibly causing worsening shortness of breath. Patient got IV Lasix  in the emergency department with improvement of symptoms. Echo showed RV severely enlarged and moderately HK, RVSP 133 mmHg, RA severe dilated, severe TR. Started Opsynvi home dose per pharmacy protocol. IV Lasix  80 mg today, will start home dose from tomorrow per heart failure team. She is diuresing well. Monitor daily weights, strict input, output, daily renal function, electrolytes. Heart failure team follow-up appreciated.  Acute on chronic anemia: Patient's baseline hemoglobin around 11-12. Presented with hemoglobin 9.5. stable. Iron panel, B12 and folate unremarkable. FOBT x 1 negative.  No active bleeding.  Continue to monitor H&H.  Type 2 diabetes mellitus: A1c 6.8. Low blood sugars noted, stopped lantus . Continue Accu-Cheks, sliding scale insulin .  Essential hypertension: Resume metoprolol .  Hyperlipidemia: Continue atorvastatin .  CKD stage IIIb: Creatinine at baseline.  GERD continue PPI.  Obesity Class II- BMI 37.93 Diet, exercise and weight reduction advised.     Out of bed to chair. Incentive spirometry. Nursing supportive care. Fall, aspiration precautions. Diet:  Diet Orders (From admission,  onward)     Start     Ordered   12/11/23 1858  Diet 2 gram sodium Room service appropriate? Yes; Fluid consistency: Thin  Diet effective now       Question Answer Comment  Room service appropriate? Yes   Fluid consistency: Thin      12/11/23 1859           DVT prophylaxis: enoxaparin  (LOVENOX ) injection 40 mg Start: 12/11/23 1900  Level of care: Telemetry Medical   Code Status: Limited: Do not attempt resuscitation (DNR) -DNR-LIMITED -Do Not Intubate/DNI   Subjective: Patient is seen and examined today morning. She is lying in bed, feels short of breath with exertion. Blood sugars low noted.  Physical Exam: Vitals:   12/12/23 0831 12/12/23 1618 12/12/23 2034 12/13/23 0752  BP: 113/64 (!) 109/56 114/67 113/60  Pulse: 77 60 79 71  Resp: 18 19 18 18   Temp: 98.1 F (36.7 C) 98.6 F (37 C) 98.7 F (37.1 C) 98.4 F (36.9 C)  TempSrc:   Oral Oral  SpO2: 98% 93% 91% 91%  Weight:      Height:        General - Elderly African American female, mild respiratory distress HEENT - PERRLA, EOMI, atraumatic head, non tender sinuses. Lung -distant breath sounds, basal rales, diffuse rhonchi, no wheezes. Heart - S1, S2 heard, no murmurs, rubs, trace pedal edema. Abdomen - Soft, non tender, obese, bowel sounds good Neuro - Alert, awake and oriented x 3, non focal exam. Skin - Warm and dry.  Data Reviewed:      Latest Ref Rng & Units 12/12/2023    5:37 AM 12/11/2023    8:14 PM 12/11/2023    1:13 PM  CBC  WBC 4.0 - 10.5 K/uL 5.3  5.4  4.9   Hemoglobin 12.0 - 15.0 g/dL 9.4  9.5  9.5   Hematocrit 36.0 - 46.0 % 30.0  30.4  30.9   Platelets 150 - 400 K/uL 248  239  252       Latest Ref Rng & Units 12/13/2023   11:22 AM 12/12/2023    5:37 AM 12/11/2023    8:14 PM  BMP  Glucose 70 - 99 mg/dL 97  66    BUN 8 - 23 mg/dL 24  15    Creatinine 9.55 - 1.00 mg/dL 8.39  8.61  8.61   Sodium 135 - 145 mmol/L 138  140    Potassium 3.5 - 5.1 mmol/L 4.0  3.6    Chloride 98 - 111 mmol/L 105   114    CO2 22 - 32 mmol/L 23  20    Calcium  8.9 - 10.3 mg/dL 9.0  8.9     ECHOCARDIOGRAM COMPLETE Result Date: 12/12/2023    ECHOCARDIOGRAM REPORT   Patient Name:   Deborah Coffey Date of Exam: 12/12/2023 Medical Rec #:  [id number]          Height:       65.0 in Accession #:    [id number]         Weight:       228.0 lb Date of Birth:  [birth year]         BSA:          2.091 m Patient Age:    67 years           BP:           113/64 mmHg Patient Gender: F                  HR:           88 bpm. Exam Location:  Inpatient Procedure: 2D Echo, Color Doppler and Cardiac Doppler (Both Spectral and Color            Flow Doppler were utilized during procedure). Indications:    CHF- Acute Systolic I50.21  History:        Patient has prior history of Echocardiogram examinations, most                 recent 02/25/2023. CAD, Prior CABG; Risk Factors:Hypertension,                 Dyslipidemia, Diabetes and Current Smoker.  Sonographer:    Koleen Popper RDCS Referring Phys: [id number] RAVI PAHWANI  Sonographer Comments: Patient is obese. IMPRESSIONS  1. Left ventricular ejection fraction, by estimation, is 60 to 65%. The left ventricle has normal function. The left ventricle has no regional wall motion abnormalities. Left ventricular diastolic parameters were normal. There is the interventricular septum is flattened in systole and diastole, consistent with right ventricular pressure and volume overload.  2. Right ventricular systolic function is moderately reduced. The right ventricular size is severely enlarged. There is severely elevated pulmonary artery systolic pressure. The estimated right ventricular systolic pressure is 133.8 mmHg.  3. Right atrial size was severely dilated.  4. The mitral valve is normal in structure. No evidence of mitral valve regurgitation. No evidence of mitral stenosis.  5. Tricuspid valve regurgitation is severe.  6. The aortic valve is tricuspid. Aortic valve regurgitation is not visualized.  Aortic valve sclerosis/calcification is present, without any evidence of aortic stenosis.  7. Pulmonic valve regurgitation is moderate.  8. The inferior vena cava is dilated in  size with <50% respiratory variability, suggesting right atrial pressure of 15 mmHg. FINDINGS  Left Ventricle: Left ventricular ejection fraction, by estimation, is 60 to 65%. The left ventricle has normal function. The left ventricle has no regional wall motion abnormalities. The left ventricular internal cavity size was normal in size. There is  no left ventricular hypertrophy. The interventricular septum is flattened in systole and diastole, consistent with right ventricular pressure and volume overload. Left ventricular diastolic parameters were normal. Normal left ventricular filling pressure. Right Ventricle: The right ventricular size is severely enlarged. No increase in right ventricular wall thickness. Right ventricular systolic function is moderately reduced. There is severely elevated pulmonary artery systolic pressure. The tricuspid regurgitant velocity is 5.45 m/s, and with an assumed right atrial pressure of 15 mmHg, the estimated right ventricular systolic pressure is 133.8 mmHg. Left Atrium: Left atrial size was normal in size. Right Atrium: Right atrial size was severely dilated. Pericardium: There is no evidence of pericardial effusion. Mitral Valve: The mitral valve is normal in structure. Mild to moderate mitral annular calcification. No evidence of mitral valve regurgitation. No evidence of mitral valve stenosis. Tricuspid Valve: The tricuspid valve is normal in structure. Tricuspid valve regurgitation is severe. No evidence of tricuspid stenosis. Aortic Valve: The aortic valve is tricuspid. Aortic valve regurgitation is not visualized. Aortic valve sclerosis/calcification is present, without any evidence of aortic stenosis. Pulmonic Valve: The pulmonic valve was normal in structure. Pulmonic valve regurgitation is  moderate. No evidence of pulmonic stenosis. Aorta: The aortic root is normal in size and structure. Venous: The inferior vena cava is dilated in size with less than 50% respiratory variability, suggesting right atrial pressure of 15 mmHg. IAS/Shunts: No atrial level shunt detected by color flow Doppler.  LEFT VENTRICLE PLAX 2D LVIDd:         4.10 cm   Diastology LVIDs:         3.20 cm   LV e' medial:    7.72 cm/s LV PW:         1.10 cm   LV E/e' medial:  9.3 LV IVS:        1.00 cm   LV e' lateral:   13.70 cm/s LVOT diam:     1.80 cm   LV E/e' lateral: 5.2 LV SV:         49 LV SV Index:   23 LVOT Area:     2.54 cm  RIGHT VENTRICLE            IVC RV S prime:     5.72 cm/s  IVC diam: 2.30 cm TAPSE (M-mode): 1.4 cm LEFT ATRIUM           Index        RIGHT ATRIUM           Index LA diam:      4.20 cm 2.01 cm/m   RA Area:     23.30 cm LA Vol (A2C): 34.9 ml 16.69 ml/m  RA Volume:   76.20 ml  36.44 ml/m LA Vol (A4C): 68.9 ml 32.95 ml/m  AORTIC VALVE LVOT Vmax:   89.60 cm/s LVOT Vmean:  58.200 cm/s LVOT VTI:    0.192 m  AORTA Ao Root diam: 2.70 cm Ao Asc diam:  2.90 cm MITRAL VALVE               TRICUSPID VALVE MV Area (PHT): 4.60 cm    TR Peak grad:   118.8 mmHg MV Decel Time: 165  msec    TR Vmax:        545.00 cm/s MV E velocity: 71.80 cm/s MV A velocity: 67.60 cm/s  SHUNTS MV E/A ratio:  1.06        Systemic VTI:  0.19 m                            Systemic Diam: 1.80 cm Wilbert Bihari MD Electronically signed by Wilbert Bihari MD Signature Date/Time: 12/12/2023/5:33:00 PM    Final    DG Chest 2 View Result Date: 12/11/2023 CLINICAL DATA:  Shortness of breath. EXAM: CHEST - 2 VIEW COMPARISON:  Chest radiograph dated 03/03/2023. FINDINGS: Mild cardiomegaly with mild vascular congestion. No focal consolidation, or pneumothorax. Small bilateral pleural effusions. No acute osseous pathology. Median sternotomy wires. IMPRESSION: Mild cardiomegaly with mild vascular congestion and small bilateral pleural effusions.  Electronically Signed   By: Vanetta Chou M.D.   On: 12/11/2023 14:00    Family Communication: Discussed with patient, she understand and agree. All questions answered.  Disposition: Status is: Observation The patient remains OBS appropriate and will d/c before 2 midnights.  Planned Discharge Destination: Home with Home Health     Time spent: 47 minutes  Author: Concepcion Riser, MD 12/13/2023 1:01 PM Secure chat 7am to 7pm For on call review [email or web address].

## 2023-12-13 NOTE — Progress Notes (Signed)
 Physical Therapy Treatment Patient Details Name: Deborah Coffey MRN: [id number] DOB: 12-Jun-1955 Today's Date: 12/13/2023   History of Present Illness Pt is a 68 y/o F admitted on 12/11/23 after presenting with c/o SOB & LE swelling. Pt is being treated for DOE 2/2 CHF vs anemia. PMH: CHF, RHC March 2025, DM2, HTN, anemia, depression, TIA    PT Comments  The pt is making great functional progress, demonstrating an ability to ambulate increased distances and even without UE support without LOB. She does display and report increased DOE when not utilizing an AD than when using a RW to ambulate though, even though her sats remained >/= 93% on RA throughout. She reports she is close to her baseline and declined a need for post-acute PT follow-up, thus updated recs to reflect this. Updated DME recs to a rollator to improve her energy conservation at d/c as well. Educated pt on reducing sodium intake, rinsing canned foods, eating fresh/frozen fruits/veggies if able, weighing herself daily, progressing mobility and frequency of mobility within tolerance, use of rollator as needed, safe application of rollator brakes with transfers, and EC techniques to manage her CHF. She verbalized understanding. Will continue to follow acutely.   If plan is discharge home, recommend the following: A little help with bathing/dressing/bathroom;Assistance with cooking/housework;Assist for transportation   Can travel by private vehicle        Equipment Recommendations  Rollator (4 wheels)    Recommendations for Other Services       Precautions / Restrictions Precautions Precautions: Fall Restrictions Weight Bearing Restrictions Per Provider Order: No     Mobility  Bed Mobility               General bed mobility comments: Pt standing in room upon arrival and sitting in recliner at end of session.    Transfers Overall transfer level: Modified independent Equipment used: None Transfers: Sit to/from  Stand Sit to Stand: Modified independent (Device/Increase time)           General transfer comment: Pt standing upon arrival, transferred stand to sit to recliner end of session without assistance or LOB    Ambulation/Gait Ambulation/Gait assistance: Supervision Gait Distance (Feet): 350 Feet Assistive device: Rolling walker (2 wheels), None Gait Pattern/deviations: Decreased step length - right, Decreased step length - left, Decreased stride length, Step-through pattern Gait velocity: decreased Gait velocity interpretation: 1.31 - 2.62 ft/sec, indicative of limited community ambulator   General Gait Details: Pt ambulates with a slow step-through gait pattern, ambulating the initial ~280 ft with the RW and the final ~70 ft without an AD. No LOB noted, even when cued to turn head L <> R, but did note increased SOB without an AD (SpO2 >/= 93% on RA throughout). Supervision for safety   Stairs             Wheelchair Mobility     Tilt Bed    Modified Rankin (Stroke Patients Only)       Balance Overall balance assessment: Mild deficits observed, not formally tested                                          Communication Communication Communication: No apparent difficulties  Cognition Arousal: Alert Behavior During Therapy: WFL for tasks assessed/performed   PT - Cognitive impairments: No apparent impairments  Following commands: Intact      Cueing Cueing Techniques: Verbal cues  Exercises      General Comments General comments (skin integrity, edema, etc.): SpO2 >/= 93% on RA throughout session; Educated pt on reducing sodium intake, rinsing canned foods, eating fresh/frozen fruits/veggies if able, weighing herself daily, progressing mobility and frequency of mobility within tolerance, use of rollator as needed, safe application of rollator brakes with transfers, and EC techniques to manage her CHF. She  verbalized understanding.      Pertinent Vitals/Pain Pain Assessment Pain Assessment: Faces Faces Pain Scale: No hurt Pain Intervention(s): Monitored during session    Home Living                          Prior Function            PT Goals (current goals can now be found in the care plan section) Acute Rehab PT Goals Patient Stated Goal: get better PT Goal Formulation: With patient Time For Goal Achievement: 12/26/23 Potential to Achieve Goals: Good Progress towards PT goals: Progressing toward goals    Frequency    Min 1X/week      PT Plan      Co-evaluation              AM-PAC PT 6 Clicks Mobility   Outcome Measure  Help needed turning from your back to your side while in a flat bed without using bedrails?: None Help needed moving from lying on your back to sitting on the side of a flat bed without using bedrails?: None Help needed moving to and from a bed to a chair (including a wheelchair)?: None Help needed standing up from a chair using your arms (e.g., wheelchair or bedside chair)?: None Help needed to walk in hospital room?: A Little Help needed climbing 3-5 steps with a railing? : A Little 6 Click Score: 22    End of Session   Activity Tolerance: Patient tolerated treatment well Patient left: in chair;with call bell/phone within reach Nurse Communication: Mobility status;Other (comment) (sats) PT Visit Diagnosis: Muscle weakness (generalized) (M62.81);Other abnormalities of gait and mobility (R26.89);Unsteadiness on feet (R26.81)     Time: 8781-8763 PT Time Calculation (min) (ACUTE ONLY): 18 min  Charges:    $Therapeutic Activity: 8-22 mins PT General Charges $$ ACUTE PT VISIT: 1 Visit                     Deborah Coffey, PT, DPT Acute Rehabilitation Services  Office: [phone]    Deborah CHRISTELLA Coffey 12/13/2023, 12:45 PM

## 2023-12-13 NOTE — Consult Note (Signed)
 Advanced Heart Failure Team Consult Note   Primary Physician: [name], MD Cardiologist:  Oneil Parchment, MD  Reason for Consultation: Pulmonary HTN with RV failure/cor pulmonale  HPI:    Deborah Coffey is seen today for evaluation of pulmonary hypertension w/ RV failure/cor pulmonale at the request of Dr. Darci.   68 y.o. female with history of CAD s/p CABG x1 (LIMA-LAD) 2005, tobacco use, chronic diastolic CHF, anemia, depression, HLD, HTN, DM, CKD stage 3b, prior TIAs, pulmonary HTN (felt due to underlying lung disease/smoking), and morbid obesity.    Echo12/22 EF 65-70% RV moderately dilated with moderate HK. Flattened septum Moderate TR RVSP est 110 mmHG     Chest CT scan 2/22. No PE   R/L cath on 04/16/21: - mLAD 75% LIMA to LAD is widely patent. pRCA 50% - EF 65% - RA 5 PA 79/18 (39) PCW 12  CO 6.4 L/min; CI PVR 4.2   Sleep study 06/2021 only c/w mild OSA AHI 5.5    Echo 11/24 EF 60-65%, IVC flattened in systole and diastole, c/w RV pressure and volume overload.  RV severely enlarged w/ severely reduced systolic function. The estimated RVSP is 92.5 mmHg (comparred to 111 mmHg previous study). RA severely dilated. Moderate TR    PFTs 12/24: FEV1 1.27 (50%) FVC 1.84 (55),ratio 77, DLCOcor 48. Moderately severe restriction with reversibility (12% change) and severe diffusion defect. She is being followed by [facility] (Dr. Brenna). He has also being following with pulm for pulmonary nodules.   Echo 11/24 EF 60-65% RV mod reduced RVSP 92    RHC 3/25 demonstrated severe PAH w/ cor pulmonale, suspect Group 1 and possibly Group 3. She was started on Sildenafil  20 tid.  RA = 4, RV = 77/6, PA =  78/19 (42), PCW = 6, Fick CO/CI = 3.9/1.9, Thermo CO/CI = 3.6/1.7, PVR = 9.3 (Fick) 10.0 (TD), Ao sat = 96%, PA sat = 57%, 58%, PAPi = 14.8  At last f/u with Dr. Cherrie in 06/25 sildenafil  was switched to Opsynvi. Metoprolol  reduced with plans to stop at future visit.    She presented 12/11/23 with worsening shortness of breath and lower extremity X 3 days. CXR with evidence of CHF. She was admitted under observation status for acute on chronic CHF. Diuresed with IV lasix  80 mg IV X 1 and 60 mg IV X 1. Echo this admit with LVEF 60-65%, septal flattening in systole and diastole, RV severely enlarged and moderately HK, RVSP 133 mmHg, RA severe dilated, severe TR, dilated IVC.  Advanced Heart Failure asked to see to assist with management of pulmonary hypertension and RV failure.  Dyspnea and leg edema improving with diuretics but still gets winded with light activity. Reports adherence with home medications.  Home Medications Prior to Admission medications   Medication Sig Start Date End Date Taking? Authorizing Provider  acetaminophen  (TYLENOL ) 500 MG tablet Take 1,000 mg by mouth every 6 (six) hours as needed for headache or moderate pain (pain score 4-6).   Yes [provider]  aspirin  EC 81 MG tablet Take 81 mg by mouth daily.   Yes [provider]  atorvastatin  (LIPITOR) 10 MG tablet TAKE 1 TABLET ONE TIME DAILY 10/17/23  Yes Newlin, Enobong, MD  cyanocobalamin  (VITAMIN B12) 1000 MCG tablet Take 1,000 mcg by mouth daily.   Yes [provider]  dapagliflozin  propanediol (FARXIGA ) 10 MG TABS tablet TAKE 1 TABLET EVERY DAY BEFORE BREAKFAST 10/17/23  Yes Newlin, Enobong, MD  furosemide  (  LASIX ) 40 MG tablet Take 40-80 mg by mouth See admin instructions. Take 80 mg by mouth in the morning and 40 mg at night   Yes [provider]  gabapentin  (NEURONTIN ) 300 MG capsule Take 300 mg by mouth 2 (two) times daily.   Yes [provider]  Macitentan -Tadalafil  (OPSYNVI) 10-40 MG TABS Take 1 tablet by mouth in the morning.   Yes [provider]  metFORMIN  (GLUCOPHAGE -XR) 500 MG 24 hr tablet Take 2 tablets (1,000 mg total) by mouth 2 (two) times daily. Patient taking differently: Take 500 mg by mouth 2 (two) times daily.  02/02/23  Yes Newlin, Enobong, MD  metoprolol  tartrate (LOPRESSOR ) 50 MG tablet Take 1 tablet (50 mg total) by mouth daily. 10/11/23  Yes Bensimhon, Daniel R, MD  Multiple Vitamin (MULTIVITAMIN WITH MINERALS) TABS tablet Take 1 tablet by mouth daily.   Yes [provider]  nitroGLYCERIN  (NITROSTAT ) 0.4 MG SL tablet Place 1 tablet (0.4 mg total) under the tongue every 5 (five) minutes as needed for chest pain. 03/03/23  Yes Banister, Sharlet POUR, MD  pantoprazole  (PROTONIX ) 40 MG tablet TAKE 1 TABLET TWICE DAILY 10/17/23  Yes Newlin, Enobong, MD  TOUJEO  MAX SOLOSTAR 300 UNIT/ML Solostar Pen INJECT 50 UNITS INTO THE SKIN DAILY (DOSE DECREASE) Patient taking differently: Inject 40 Units into the skin daily. 10/17/23  Yes Newlin, Enobong, MD  Vitamin D , Cholecalciferol, 25 MCG (1000 UT) TABS Take 1,000 mcg by mouth daily.   Yes [provider]  Alcohol Swabs (B-D SINGLE USE SWABS REGULAR) PADS USE AS DIRECTED 04/13/17   Jegede, Olugbemiga E, MD  Blood Glucose Monitoring Suppl (ONETOUCH VERIO) w/Device KIT Use as instructed to test blood glucose 3 times daily. 02/05/22   Newlin, Enobong, MD  Continuous Glucose Sensor (DEXCOM G6 SENSOR) MISC Use to check blood sugar as directed. Dx. E11.59 08/10/23   Newlin, Enobong, MD  diclofenac  Sodium (VOLTAREN ) 1 % GEL Apply 2 g topically daily as needed (muscle pain). Patient not taking: Reported on 12/11/2023 02/02/23   Newlin, Enobong, MD  glucose blood (ONETOUCH VERIO) test strip Use as instructed to test blood glucose 3 times daily. 02/05/22   Newlin, Enobong, MD  Insulin  Pen Needle (DROPLET PEN NEEDLES) 31G X 8 MM MISC USE WITH TOUJEO  SOLOSTAR PEN EVERY DAY 02/02/23   Newlin, Enobong, MD  Lancets (ONETOUCH DELICA PLUS LANCET33G) MISC Use as instructed to test blood glucose 3 times daily. 02/05/22   [name], MD    Past Medical History: Past Medical History:  Diagnosis Date   Allergy    Anemia    CAD in native artery 02/18/2004   Mid LAD  lesoin just after Large D1 --> CABG x 1 LIMA-LAD   CAP (community acquired pneumonia) 08/07/2014   CHF (congestive heart failure) (HCC)    Chronic kidney disease, stage 3b (HCC)    Depression    High cholesterol    History of blood transfusion 04/20/2003   related to OHS    Hypertension    Lung nodules    Mild carotid artery disease (HCC)    Pulmonary hypertension (HCC)    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 competitive flow   TIA (transient ischemic attack)    Type II diabetes mellitus (HCC)     Past Surgical History: Past Surgical History:  Procedure Laterality Date   CARDIAC CATHETERIZATION  2010   Previous LAD 95% lesion - now ~30-40%; patent LIMA  with competitive flow   CESAREAN SECTION  1977; 1989   CHOLECYSTECTOMY  ~ 2012   CORONARY ARTERY BYPASS GRAFT  02/2004   CABG X1 (03/22/2013); LIMA-LAD   RIGHT HEART CATH N/A 07/14/2023   Procedure: RIGHT HEART CATH;  Surgeon: Cherrie Toribio SAUNDERS, MD;  Location: [facility] INVASIVE CV LAB;  Service: Cardiovascular;  Laterality: N/A;   RIGHT/LEFT HEART CATH AND CORONARY/GRAFT ANGIOGRAPHY N/A 04/16/2021   Procedure: RIGHT/LEFT HEART CATH AND CORONARY/GRAFT ANGIOGRAPHY;  Surgeon: Dann Candyce RAMAN, MD;  Location: [facility] INVASIVE CV LAB;  Service: Cardiovascular;  Laterality: N/A;   TUBAL LIGATION  1989    Family History: Family History  Problem Relation Age of Onset   Heart disease Mother    Diabetes Mother    Cancer Mother        cervical   Diabetes Father    Diabetes Sister    Heart disease Sister    Lupus Sister    Heart failure Sister    Hepatitis C Sister    Hypertension Daughter    Diabetes Brother    Heart attack Brother    Heart failure Brother    Diabetes Brother    Colon cancer Neg Hx    Breast cancer Neg Hx     Social History: Social History   Socioeconomic History   Marital status: Divorced    Spouse name: Not on file   Number of children: Not on file    Years of education: 12   Highest education level: Some college, no degree  Occupational History   Occupation: Disabled  Tobacco Use   Smoking status: Every Day    Current packs/day: 0.25    Average packs/day: 0.3 packs/day for 42.0 years (10.5 ttl pk-yrs)    Types: Cigarettes   Smokeless tobacco: Current   Tobacco comments:    10 Cigarettes a day. 08/22/2023  Vaping Use   Vaping status: Some Days   Substances: Flavoring  Substance and Sexual Activity   Alcohol use: Not Currently   Drug use: No    Types: Marijuana    Comment: smoked pot years ago but not currently   Sexual activity: Not Currently  Other Topics Concern   Not on file  Social History Narrative   Lives with daughter and 3 grandchildren   Social Drivers of Health   Financial Resource Strain: Low Risk  (08/23/2023)   Overall Financial Resource Strain (CARDIA)    Difficulty of Paying Living Expenses: Not hard at all  Food Insecurity: No Food Insecurity (12/12/2023)   Hunger Vital Sign    Worried About Running Out of Food in the Last Year: Never true    Ran Out of Food in the Last Year: Never true  Transportation Needs: No Transportation Needs (12/12/2023)   PRAPARE - Administrator, Civil Service (Medical): No    Lack of Transportation (Non-Medical): No  Physical Activity: Inactive (08/23/2023)   Exercise Vital Sign    Days of Exercise per Week: 0 days    Minutes of Exercise per Session: 0 min  Stress: No Stress Concern Present (08/23/2023)   [employer] of Occupational Health - Occupational Stress Questionnaire    Feeling of Stress : Not at all  Social Connections: Unknown (12/12/2023)   Social Connection and Isolation Panel    Frequency of Communication with Friends and Family: More than three times a week    Frequency of Social Gatherings with Friends and Family: More than three times a week    Attends  Religious Services: Not on file    Active Member of Clubs or Organizations: Not on file     Attends Club or Organization Meetings: Not on file    Marital Status: Not on file    Allergies:  Allergies  Allergen Reactions   Ciprofloxacin Rash    Objective:    Vital Signs:   Temp:  [98.4 F (36.9 C)-98.7 F (37.1 C)] 98.4 F (36.9 C) (08/26 0752) Pulse Rate:  [60-79] 71 (08/26 0752) Resp:  [18-19] 18 (08/26 0752) BP: (109-114)/(56-67) 113/60 (08/26 0752) SpO2:  [91 %-93 %] 91 % (08/26 0752) Last BM Date : 12/11/23  Weight change: Filed Weights   12/11/23 1257 12/12/23 0401  Weight: 100.3 kg 103.4 kg    Intake/Output:   Intake/Output Summary (Last 24 hours) at 12/13/2023 1058 Last data filed at 12/13/2023 0900 Gross per 24 hour  Intake 1040 ml  Output 850 ml  Net 190 ml      Physical Exam    General:  Chronically ill appearing Cor: + JVD Regular rate & rhythm. 3/6 TR murmur Lungs: clear Abdomen: soft, nontender, nondistended.  Extremities: trace edema Neuro: alert & orientedx3. Affect pleasant   Telemetry   SR 90s  Labs   Basic Metabolic Panel: Recent Labs  Lab 12/11/23 1313 12/11/23 2014 12/12/23 0537  NA 140  --  140  K 3.7  --  3.6  CL 110  --  114*  CO2 19*  --  20*  GLUCOSE 106*  --  66*  BUN 16  --  15  CREATININE 1.33* 1.38* 1.38*  CALCIUM  9.3  --  8.9    Liver Function Tests: No results for input(s): AST, ALT, ALKPHOS, BILITOT, PROT, ALBUMIN in the last 168 hours. No results for input(s): LIPASE, AMYLASE in the last 168 hours. No results for input(s): AMMONIA in the last 168 hours.  CBC: Recent Labs  Lab 12/11/23 1313 12/11/23 2014 12/12/23 0537  WBC 4.9 5.4 5.3  NEUTROABS  --   --  3.0  HGB 9.5* 9.5* 9.4*  HCT 30.9* 30.4* 30.0*  MCV 93.9 91.3 91.7  PLT 252 239 248    Cardiac Enzymes: No results for input(s): CKTOTAL, CKMB, CKMBINDEX, TROPONINI in the last 168 hours.  BNP: BNP (last 3 results) Recent Labs    05/04/23 1545 08/15/23 1606 12/11/23 1313  BNP 759.2* 566.5* 673.3*     ProBNP (last 3 results) No results for input(s): PROBNP in the last 8760 hours.   CBG: Recent Labs  Lab 12/12/23 1133 12/12/23 1615 12/12/23 2033 12/13/23 0749 12/13/23 0823  GLUCAP 123* 89 147* 62* 88    Coagulation Studies: No results for input(s): LABPROT, INR in the last 72 hours.   Imaging   No results found.   Medications:     Current Medications:  atorvastatin   10 mg Oral Daily   cyanocobalamin   1,000 mcg Oral Daily   dapagliflozin  propanediol  10 mg Oral Daily   enoxaparin  (LOVENOX ) injection  40 mg Subcutaneous Q24H   furosemide   80 mg Intravenous Once   gabapentin   300 mg Oral BID   insulin  aspart  0-15 Units Subcutaneous TID WC   insulin  aspart  0-5 Units Subcutaneous QHS   macitentan   10 mg Oral Daily   metoprolol  tartrate  50 mg Oral Daily   pantoprazole   40 mg Oral BID   sodium chloride  flush  3 mL Intravenous Q12H   tadalafil   40 mg Oral Daily    Infusions:  Patient Profile   68 y.o. female with history of pulmonary hypertension with RV failure/cor pulmonale, CAD, morbid obesity, OSA, HTN, tobacco use, restrictive lung disease, HTN, DM, chronic anemia.   Admitted with acute on chronic CHF.  Assessment/Plan  1. Pulmonary HTN with RV failure/cor pulmonale - Echo (12/22): EF 65-70% RV moderately dilated with moderate HK, flattened septum, moderate TR, RVSP est 110 mmHG   - Chest CT scan (2/22): No PE - RHC (12/22): RA 5 PA 79/18 (39) PCW 12  CO 6.4 L/min; CI PVR 4.2 - PFTs with significant restrictive/obstructive lung disease - ANA, ANCA, [place]-70, RF all negative. - Sleep study with mild OSA.  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  - Echo this admit with LVEF 60-65%, septal flattening in systole and diastole, RV  severely enlarged and moderately HK, RVSP 133 mmHg, RA severe dilated, severe TR, dilated IVC - NYHA III. Improved with diuresis but still has some volume on board. Give 80 mg lasix  IV again today. Check BMET today. - Can restart home po lasix  60 BID tomorrow. - Can separate Opsynvi into opsumit /macitentan  while here - Continue Farxiga  10 mg daily. - Stop metoprolol  - O2 sats low 90s on RA. Check ambulatory O2 sats, may need home oxygen   2. CAD - CABG 2005 LIMA-> LAD - Cath (04/16/21): mLAD 75% LIMA to LAD is widely patent. pRCA 50 - No s/s angina - Continue statin. Restart aspirin  prior to discharge.   3. Morbid obesity - Body mass index is 37.93 kg/m. - No longer on ozempic    4. OSA - Mild on sleep study. Not on CPAP - Has follow up with Pulm - Overnight oxymetry ordered at f/u in June, unable to find a result in the chart   5. HTN - BP ok    6. Smoking - Recommend cessation  7. Anemia -Baseline Hgb looks like 11s-12s, 9s this admit -FOBT negative -Workup per primary  8. CKD IIIb -Baseline Scr 1.3-1.6 -Continue Farxiga  -Labs today   Length of Stay: 0  Johngabriel Verde N, PA-C  12/13/2023, 10:58 AM    Advanced Heart Failure Team Pager [phone] (M-F; 7a - 5p)  Please contact [facility] for night-coverage after hours (4p -7a ) and weekends on [email or web address]

## 2023-12-13 NOTE — Progress Notes (Signed)
 SATURATION QUALIFICATIONS: (This note is used to comply with regulatory documentation for home oxygen)  Patient Saturations on Room Air at Rest = 93%  Patient Saturations on Room Air while Ambulating = 93%   Please briefly explain why patient needs home oxygen: Pt is not currently requiring supplemental O2 when ambulating or at rest to keep sats >/= 93%.    Theo Ferretti, PT, DPT Acute Rehabilitation Services  Office: [phone]

## 2023-12-13 NOTE — Progress Notes (Signed)
 Hypoglycemic Event  CBG: 62 at 0749  Treatment: 4 oz juice/soda  Symptoms: headace  Follow-up CBG: Upfz:9176 CBG Result:88   Comments/MD notified:Dr. Darci notified and plan is to hold lantus     Deborah Coffey A Proctor-Gann

## 2023-12-14 DIAGNOSIS — D649 Anemia, unspecified: Secondary | ICD-10-CM | POA: Diagnosis not present

## 2023-12-14 DIAGNOSIS — K219 Gastro-esophageal reflux disease without esophagitis: Secondary | ICD-10-CM | POA: Diagnosis present

## 2023-12-14 DIAGNOSIS — N179 Acute kidney failure, unspecified: Secondary | ICD-10-CM | POA: Diagnosis not present

## 2023-12-14 DIAGNOSIS — I2721 Secondary pulmonary arterial hypertension: Secondary | ICD-10-CM | POA: Diagnosis present

## 2023-12-14 DIAGNOSIS — N1832 Chronic kidney disease, stage 3b: Secondary | ICD-10-CM | POA: Diagnosis present

## 2023-12-14 DIAGNOSIS — E669 Obesity, unspecified: Secondary | ICD-10-CM | POA: Diagnosis not present

## 2023-12-14 DIAGNOSIS — R5383 Other fatigue: Secondary | ICD-10-CM | POA: Diagnosis not present

## 2023-12-14 DIAGNOSIS — N183 Chronic kidney disease, stage 3 unspecified: Secondary | ICD-10-CM

## 2023-12-14 DIAGNOSIS — J684 Chronic respiratory conditions due to chemicals, gases, fumes and vapors: Secondary | ICD-10-CM | POA: Diagnosis present

## 2023-12-14 DIAGNOSIS — F17290 Nicotine dependence, other tobacco product, uncomplicated: Secondary | ICD-10-CM | POA: Diagnosis present

## 2023-12-14 DIAGNOSIS — E7800 Pure hypercholesterolemia, unspecified: Secondary | ICD-10-CM | POA: Diagnosis present

## 2023-12-14 DIAGNOSIS — J4489 Other specified chronic obstructive pulmonary disease: Secondary | ICD-10-CM | POA: Diagnosis present

## 2023-12-14 DIAGNOSIS — E1122 Type 2 diabetes mellitus with diabetic chronic kidney disease: Secondary | ICD-10-CM | POA: Diagnosis present

## 2023-12-14 DIAGNOSIS — I5033 Acute on chronic diastolic (congestive) heart failure: Secondary | ICD-10-CM | POA: Diagnosis not present

## 2023-12-14 DIAGNOSIS — I5023 Acute on chronic systolic (congestive) heart failure: Secondary | ICD-10-CM | POA: Diagnosis not present

## 2023-12-14 DIAGNOSIS — J439 Emphysema, unspecified: Secondary | ICD-10-CM | POA: Diagnosis present

## 2023-12-14 DIAGNOSIS — R0602 Shortness of breath: Secondary | ICD-10-CM | POA: Diagnosis present

## 2023-12-14 DIAGNOSIS — D631 Anemia in chronic kidney disease: Secondary | ICD-10-CM | POA: Diagnosis present

## 2023-12-14 DIAGNOSIS — I10 Essential (primary) hypertension: Secondary | ICD-10-CM | POA: Diagnosis not present

## 2023-12-14 DIAGNOSIS — I130 Hypertensive heart and chronic kidney disease with heart failure and stage 1 through stage 4 chronic kidney disease, or unspecified chronic kidney disease: Secondary | ICD-10-CM | POA: Diagnosis present

## 2023-12-14 DIAGNOSIS — I272 Pulmonary hypertension, unspecified: Secondary | ICD-10-CM | POA: Diagnosis not present

## 2023-12-14 DIAGNOSIS — I2510 Atherosclerotic heart disease of native coronary artery without angina pectoris: Secondary | ICD-10-CM | POA: Diagnosis present

## 2023-12-14 DIAGNOSIS — I2781 Cor pulmonale (chronic): Secondary | ICD-10-CM | POA: Diagnosis not present

## 2023-12-14 DIAGNOSIS — F32A Depression, unspecified: Secondary | ICD-10-CM | POA: Diagnosis present

## 2023-12-14 DIAGNOSIS — Z7984 Long term (current) use of oral hypoglycemic drugs: Secondary | ICD-10-CM | POA: Diagnosis not present

## 2023-12-14 DIAGNOSIS — T65221A Toxic effect of tobacco cigarettes, accidental (unintentional), initial encounter: Secondary | ICD-10-CM | POA: Diagnosis present

## 2023-12-14 DIAGNOSIS — F17210 Nicotine dependence, cigarettes, uncomplicated: Secondary | ICD-10-CM | POA: Diagnosis present

## 2023-12-14 DIAGNOSIS — I071 Rheumatic tricuspid insufficiency: Secondary | ICD-10-CM | POA: Diagnosis present

## 2023-12-14 DIAGNOSIS — Z66 Do not resuscitate: Secondary | ICD-10-CM | POA: Diagnosis present

## 2023-12-14 DIAGNOSIS — I2723 Pulmonary hypertension due to lung diseases and hypoxia: Secondary | ICD-10-CM | POA: Diagnosis present

## 2023-12-14 DIAGNOSIS — Z794 Long term (current) use of insulin: Secondary | ICD-10-CM | POA: Diagnosis not present

## 2023-12-14 LAB — GLUCOSE, CAPILLARY
Glucose-Capillary: 193 mg/dL — ABNORMAL HIGH (ref 70–99)
Glucose-Capillary: 72 mg/dL (ref 70–99)
Glucose-Capillary: 113 mg/dL — ABNORMAL HIGH (ref 70–99)
Glucose-Capillary: 197 mg/dL — ABNORMAL HIGH (ref 70–99)

## 2023-12-14 LAB — BASIC METABOLIC PANEL WITH GFR
Anion gap: 11 (ref 5–15)
BUN: 31 mg/dL — ABNORMAL HIGH (ref 8–23)
CO2: 22 mmol/L (ref 22–32)
Calcium: 8.7 mg/dL — ABNORMAL LOW (ref 8.9–10.3)
Chloride: 106 mmol/L (ref 98–111)
Creatinine, Ser: 1.73 mg/dL — ABNORMAL HIGH (ref 0.44–1.00)
GFR, Estimated: 32 mL/min — ABNORMAL LOW (ref >60)
Glucose, Bld: 141 mg/dL — ABNORMAL HIGH (ref 70–99)
Potassium: 4.1 mmol/L (ref 3.5–5.1)
Sodium: 139 mmol/L (ref 135–145)

## 2023-12-14 LAB — CBC
HCT: 28.9 % — ABNORMAL LOW (ref 36.0–46.0)
Hemoglobin: 9.3 g/dL — ABNORMAL LOW (ref 12.0–15.0)
MCH: 29.2 pg (ref 26.0–34.0)
MCHC: 32.2 g/dL (ref 30.0–36.0)
MCV: 90.6 fL (ref 80.0–100.0)
Platelets: 215 K/uL (ref 150–400)
RBC: 3.19 MIL/uL — ABNORMAL LOW (ref 3.87–5.11)
RDW: 16.1 % — ABNORMAL HIGH (ref 11.5–15.5)
WBC: 4.9 K/uL (ref 4.0–10.5)
nRBC: 0 % (ref 0.0–0.2)

## 2023-12-14 MED ORDER — ALBUTEROL SULFATE (2.5 MG/3ML) 0.083% IN NEBU
2.5000 mg | INHALATION_SOLUTION | Freq: Once | RESPIRATORY_TRACT | Status: DC
  Filled 2023-12-14: qty 3

## 2023-12-14 MED ORDER — FUROSEMIDE 40 MG PO TABS
60.0000 mg | ORAL_TABLET | Freq: Two times a day (BID) | ORAL | Status: DC
  Administered 2023-12-14: 60 mg via ORAL
  Filled 2023-12-14: qty 1

## 2023-12-14 MED ORDER — ALBUTEROL SULFATE (2.5 MG/3ML) 0.083% IN NEBU
2.5000 mg | INHALATION_SOLUTION | Freq: Four times a day (QID) | RESPIRATORY_TRACT | Status: DC | PRN
  Administered 2023-12-14: 2.5 mg via RESPIRATORY_TRACT
  Filled 2023-12-14: qty 3

## 2023-12-14 MED ORDER — DIGOXIN 125 MCG PO TABS
0.1250 mg | ORAL_TABLET | Freq: Every day | ORAL | Status: DC
  Administered 2023-12-14 – 2023-12-15 (×2): 0.125 mg via ORAL
  Filled 2023-12-14 (×2): qty 1

## 2023-12-14 NOTE — Progress Notes (Signed)
 Occupational Therapy Treatment Patient Details Name: Deborah Coffey MRN: [id number] DOB: 05-31-1955 Today's Date: 12/14/2023   History of present illness Pt is a 68 y/o F admitted on 12/11/23 after presenting with c/o SOB & LE swelling. Pt is being treated for DOE 2/2 CHF vs anemia. PMH: CHF, RHC March 2025, DM2, HTN, anemia, depression, TIA   OT comments  Patient making good gains with OT treatment with setup for donning gown for back and able to donn slip on shoes without assistance. Patient addressed toilet and shower transfers with RW for mobility, per patient's request, and supervision. Patient states she has suction cup rails at home for shower.  Patient performed mobility in hallway with rollator walker for education on safety with use and seated rest to address activity tolerance.  Discharge recommendations continue to be appropriate. Acute OT to continue to follow to address established goals.       If plan is discharge home, recommend the following:  A little help with walking and/or transfers;A little help with bathing/dressing/bathroom;Assist for transportation;Assistance with cooking/housework   Equipment Recommendations  Other (comment) (RW)    Recommendations for Other Services      Precautions / Restrictions Precautions Precautions: Fall Restrictions Weight Bearing Restrictions Per Provider Order: No       Mobility Bed Mobility Overal bed mobility: Modified Independent             General bed mobility comments: able to get to EOB without assistance and left on EOB at end of session    Transfers Overall transfer level: Modified independent Equipment used: Rolling walker (2 wheels), Rollator (4 wheels), None Transfers: Sit to/from Stand Sit to Stand: Modified independent (Device/Increase time)           General transfer comment: performed bathroom mobility with RW per patient's request.  Performed mobility in hallway with rollator for education on  safety and energy conservation     Balance Overall balance assessment: Mild deficits observed, not formally tested                                         ADL either performed or assessed with clinical judgement   ADL Overall ADL's : Needs assistance/impaired     Grooming: Set up;Standing           Upper Body Dressing : Set up;Sitting Upper Body Dressing Details (indicate cue type and reason): gown for back Lower Body Dressing: Supervision/safety;Sitting/lateral leans Lower Body Dressing Details (indicate cue type and reason): donn slip on shoes Toilet Transfer: Modified Independent;Regular Toilet;Rolling walker (2 wheels)       Tub/ Shower Transfer: Walk-in shower;Supervision/safety;Grab bars Web designer Details (indicate cue type and reason): patient states she has suction cup rails in bathroom at home   General ADL Comments: patient requested using RW for bathroom mobiltiy and transfers    Extremity/Trunk Assessment              Vision       Perception     Praxis     Communication Communication Communication: No apparent difficulties   Cognition Arousal: Alert Behavior During Therapy: WFL for tasks assessed/performed Cognition: No apparent impairments             OT - Cognition Comments: A/Ox4                 Following commands: Intact  Cueing   Cueing Techniques: Verbal cues  Exercises      Shoulder Instructions       General Comments education on rollator use, locking brakes and on use for resting for energy conservation    Pertinent Vitals/ Pain       Pain Assessment Pain Assessment: Faces Faces Pain Scale: No hurt Pain Intervention(s): Monitored during session  Home Living                                          Prior Functioning/Environment              Frequency  Min 2X/week        Progress Toward Goals  OT Goals(current goals can now be found in the  care plan section)  Progress towards OT goals: Progressing toward goals  Acute Rehab OT Goals Patient Stated Goal: to go home OT Goal Formulation: With patient Time For Goal Achievement: 12/26/23 Potential to Achieve Goals: Good ADL Goals Pt Will Perform Upper Body Dressing: with modified independence Pt Will Perform Lower Body Dressing: with modified independence Pt Will Transfer to Toilet: with modified independence Pt Will Perform Toileting - Clothing Manipulation and hygiene: with modified independence  Plan      Co-evaluation                 AM-PAC OT 6 Clicks Daily Activity     Outcome Measure   Help from another person eating meals?: None Help from another person taking care of personal grooming?: A Little Help from another person toileting, which includes using toliet, bedpan, or urinal?: A Little Help from another person bathing (including washing, rinsing, drying)?: A Little Help from another person to put on and taking off regular upper body clothing?: A Little Help from another person to put on and taking off regular lower body clothing?: A Little 6 Click Score: 19    End of Session Equipment Utilized During Treatment: Gait belt;Rolling walker (2 wheels);Rollator (4 wheels)  OT Visit Diagnosis: Unsteadiness on feet (R26.81);Other abnormalities of gait and mobility (R26.89);Other (comment) (decreased activity tolerance)   Activity Tolerance Patient tolerated treatment well   Patient Left in bed;with call bell/phone within reach (on EOB)   Nurse Communication Mobility status        Time: 9199-9174 OT Time Calculation (min): 25 min  Charges: OT General Charges $OT Visit: 1 Visit OT Treatments $Self Care/Home Management : 8-22 mins $Therapeutic Activity: 8-22 mins  Dick Laine, OTA Acute Rehabilitation Services  Office [phone]   Jeb LITTIE Laine 12/14/2023, 11:48 AM

## 2023-12-14 NOTE — Progress Notes (Addendum)
 Progress Note   Patient: Deborah Coffey FMW:[id number] DOB: 1956-04-16 DOA: 12/11/2023     0 DOS: the patient was seen and examined on 12/14/2023   Brief hospital course: Sabella Skillin is a 68 y.o. female with medical history significant of CHF, RHC March 2025 noting severe PAH with cor pulmonale, type 2 diabetes mellitus, hypertension presented to ED with a complaint of shortness of breath and leg swelling.  BNP 673.3, chest x-ray showed cardiomegaly, vascular congestion.  Admitted to TRH service for further management evaluation of CHF exacerbation.  Assessment and Plan: Acute on chronic CHF with preserved ejection fraction Pulmonary artery hypertension with RV failure/ Cor pulmonale- Possibly causing worsening shortness of breath. Patient got IV Lasix  in the emergency department with improvement of symptoms. Echo showed RV severely enlarged and moderately HK, RVSP 133 mmHg, RA severe dilated, severe TR. Started Opsynvi home dose 12/13/23 per pharmacy protocol. IV Lasix  80 mg until yesterday but held due to kidney dysfunction. Digoxin , Bronchodilators added. Follow heart failure team recommendations. Monitor daily weights, strict input, output, daily renal function, electrolytes.  Acute on chronic anemia: Patient's baseline hemoglobin around 11-12. Presented with hemoglobin 9.5, seems stable. Iron panel, B12 and folate unremarkable. FOBT x 1 negative.  No active bleeding.  Continue to monitor H&H.  Type 2 diabetes mellitus: A1c 6.8. Stopped lantus  due to low blood sugars. Continue Accu-Cheks, sliding scale insulin .  Essential hypertension: Resumed metoprolol .  Hyperlipidemia: Continue atorvastatin .  Acute on CKD stage IIIb: Creatinine worsening due to diuresis. Monitor daily renal function. Diuretic held.  GERD continue PPI.  Obesity Class II- BMI 37.93 Diet, exercise and weight reduction advised.     Out of bed to chair. Incentive spirometry. Nursing  supportive care. Fall, aspiration precautions. Diet:  Diet Orders (From admission, onward)     Start     Ordered   12/11/23 1858  Diet 2 gram sodium Room service appropriate? Yes; Fluid consistency: Thin  Diet effective now       Question Answer Comment  Room service appropriate? Yes   Fluid consistency: Thin      12/11/23 1859           DVT prophylaxis: enoxaparin  (LOVENOX ) injection 40 mg Start: 12/11/23 1900  Level of care: Telemetry Cardiac   Code Status: Limited: Do not attempt resuscitation (DNR) -DNR-LIMITED -Do Not Intubate/DNI   Subjective: Patient is seen and examined today morning. She is sitting on edge of bed, feels short of breath better. Eating fair, getting out of bed.  Physical Exam: Vitals:   12/14/23 0846 12/14/23 1631 12/14/23 2014 12/14/23 2031  BP: (!) 90/51 (!) 109/52  (!) 123/50  Pulse: 82 77  84  Resp: 18 18  18   Temp: 98.4 F (36.9 C) 98.4 F (36.9 C)  98.1 F (36.7 C)  TempSrc:    Oral  SpO2: 97% 90% 94% 100%  Weight:      Height:        General - Elderly African American female, mild respiratory distress HEENT - PERRLA, EOMI, atraumatic head, non tender sinuses. Lung -distant breath sounds, basal rales, diffuse rhonchi, wheezes. Heart - S1, S2 heard, no murmurs, rubs, trace pedal edema. Abdomen - Soft, non tender, obese, bowel sounds good Neuro - Alert, awake and oriented x 3, non focal exam. Skin - Warm and dry.  Data Reviewed:      Latest Ref Rng & Units 12/14/2023    4:48 AM 12/12/2023    5:37 AM 12/11/2023    8:14 PM  CBC  WBC 4.0 - 10.5 K/uL 4.9  5.3  5.4   Hemoglobin 12.0 - 15.0 g/dL 9.3  9.4  9.5   Hematocrit 36.0 - 46.0 % 28.9  30.0  30.4   Platelets 150 - 400 K/uL 215  248  239       Latest Ref Rng & Units 12/14/2023    4:48 AM 12/13/2023   11:22 AM 12/12/2023    5:37 AM  BMP  Glucose 70 - 99 mg/dL 858  97  66   BUN 8 - 23 mg/dL 31  24  15    Creatinine 0.44 - 1.00 mg/dL 8.26  8.39  8.61   Sodium 135 - 145 mmol/L  139  138  140   Potassium 3.5 - 5.1 mmol/L 4.1  4.0  3.6   Chloride 98 - 111 mmol/L 106  105  114   CO2 22 - 32 mmol/L 22  23  20    Calcium  8.9 - 10.3 mg/dL 8.7  9.0  8.9    No results found.   Family Communication: Discussed with patient, she understand and agree. All questions answered.  Disposition: Status is: Observation The patient will require care spanning > 2 midnights and should be moved to inpatient because: digoxin  therapy, AKI, debility  Planned Discharge Destination: Home with Home Health     Time spent: 46 minutes  Author: Concepcion Riser, MD 12/14/2023 9:18 PM Secure chat 7am to 7pm For on call review [email or web address].

## 2023-12-14 NOTE — Progress Notes (Signed)
 Advanced Heart Failure Rounding Note  Cardiologist: Oneil Parchment, MD  AHF: Dr. Cherrie  Chief Complaint: SOB, a/c RV Failure and Pulmonary HTN    Patient Profile   68 YO F w/ pulmonary hypertension complicated by RVF/cor pulmonale, CAD, morbid obesity, OSA, HTN, tobacco use, restrictive lung disease, HTN, T2DM currently admitted with hypoxia/volume overload 2/2 PH/RVF.  Echo on admission: LVEF 65%; reduced RV function w/ RVSP of 133   Subjective:    2.5L in UOP yesterday w/ diuretics.  Daily wts not charted   sCr 1.38>>1.60>>1.73 BUN 15>>24>>31   Feeling slightly better today but reports she still feels SOB at times. Also reports eye floaters.   Ambulated w/ PT yesterday. Per documentation, her O2 sats remained > 93% throughout ambulation on RA.    Objective:   Weight Range: 103.4 kg Body mass index is 37.93 kg/m.   Vital Signs:   Temp:  [97.6 F (36.4 C)-97.8 F (36.6 C)] 97.8 F (36.6 C) (08/26 2019) Pulse Rate:  [72-81] 81 (08/26 2019) Resp:  [18] 18 (08/26 2019) BP: (95-121)/(64-73) 121/73 (08/26 2019) SpO2:  [95 %-96 %] 96 % (08/26 2019) Last BM Date : 12/11/23  Weight change: Filed Weights   12/11/23 1257 12/12/23 0401  Weight: 100.3 kg 103.4 kg    Intake/Output:   Intake/Output Summary (Last 24 hours) at 12/14/2023 0832 Last data filed at 12/13/2023 1848 Gross per 24 hour  Intake 1170 ml  Output 2000 ml  Net -830 ml      Physical Exam     GENERAL: sitting up on side of bed. NAD Lungs- + mild diffuse wheezing   CARDIAC:  JVP 7          Normal rate with regular rhythm.  No edema.  ABDOMEN: Soft, non-tender, non-distended.  EXTREMITIES: Warm and well perfused.  NEUROLOGIC: No obvious FND     Telemetry   NSR 80s, personally reviewed   EKG    N/A   Labs    CBC Recent Labs    12/12/23 0537 12/14/23 0448  WBC 5.3 4.9  NEUTROABS 3.0  --   HGB 9.4* 9.3*  HCT 30.0* 28.9*  MCV 91.7 90.6  PLT 248 215   Basic Metabolic  Panel Recent Labs    12/13/23 1122 12/14/23 0448  NA 138 139  K 4.0 4.1  CL 105 106  CO2 23 22  GLUCOSE 97 141*  BUN 24* 31*  CREATININE 1.60* 1.73*  CALCIUM  9.0 8.7*  MG 2.3  --    Liver Function Tests Recent Labs    12/13/23 1122  AST 22  ALT 15  ALKPHOS 84  BILITOT 0.6  PROT 6.8  ALBUMIN 3.1*   No results for input(s): LIPASE, AMYLASE in the last 72 hours. Cardiac Enzymes No results for input(s): CKTOTAL, CKMB, CKMBINDEX, TROPONINI in the last 72 hours.  BNP: BNP (last 3 results) Recent Labs    05/04/23 1545 08/15/23 1606 12/11/23 1313  BNP 759.2* 566.5* 673.3*    ProBNP (last 3 results) No results for input(s): PROBNP in the last 8760 hours.   D-Dimer No results for input(s): DDIMER in the last 72 hours. Hemoglobin A1C Recent Labs    12/11/23 2014  HGBA1C 6.1*   Fasting Lipid Panel No results for input(s): CHOL, HDL, LDLCALC, TRIG, CHOLHDL, LDLDIRECT in the last 72 hours. Thyroid  Function Tests No results for input(s): TSH, T4TOTAL, T3FREE, THYROIDAB in the last 72 hours.  Invalid input(s): FREET3  Other results:   Imaging  No results found.   Medications:     Scheduled Medications:  atorvastatin   10 mg Oral Daily   cyanocobalamin   1,000 mcg Oral Daily   dapagliflozin  propanediol  10 mg Oral Daily   enoxaparin  (LOVENOX ) injection  40 mg Subcutaneous Q24H   furosemide   60 mg Oral BID   gabapentin   300 mg Oral BID   insulin  aspart  0-15 Units Subcutaneous TID WC   insulin  aspart  0-5 Units Subcutaneous QHS   macitentan   10 mg Oral Daily   pantoprazole   40 mg Oral BID   sodium chloride  flush  3 mL Intravenous Q12H   tadalafil   40 mg Oral Daily    Infusions:   PRN Medications: acetaminophen , ondansetron  (ZOFRAN ) IV, sodium chloride  flush      Assessment/Plan    1. Pulmonary HTN with RV failure/cor pulmonale - Echo (12/22): EF 65-70% RV moderately dilated with moderate HK,  flattened septum, moderate TR, RVSP est 110 mmHG   - Chest CT scan (2/22): No PE - RHC (12/22): RA 5 PA 79/18 (39) PCW 12  CO 6.4 L/min; CI PVR 4.2 - PFTs with significant restrictive/obstructive lung disease - ANA, ANCA, [place]-70, RF all negative. - Sleep study with mild OSA.  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  - Echo this admit with LVEF 60-65%, septal flattening in systole and diastole, RV severely enlarged and moderately HK, RVSP 133 mmHg, RA severe dilated, severe TR, dilated IVC - NYHA III. Improved with diuresis. Hold diuretics today w/ bump in  SCr and BUN  - Can restart home po lasix  60 BID tomorrow. - Can separate Opsynvi into opsumit /macitentan  while here - Continue Farxiga  10 mg daily. - Metoprolol  discontinued  - add digoxin  0.125 mg daily  - ambulatory O2 sats on RA remained > 93% per PT    2. CAD - CABG 2005 LIMA-> LAD - Cath (04/16/21): mLAD 75% LIMA to LAD is widely patent. pRCA 50 - stable w/o CP  - Continue statin. Restart aspirin  prior to discharge.   3. Morbid obesity - Body mass index is 37.93 kg/m. - No longer on ozempic    4. OSA - Mild on sleep study. Not on CPAP - Has follow up with Pulm - Overnight oxymetry ordered at f/u in June, unable to find a result in the chart   5. HTN - BP ok    6. Smoking - Recommend cessation   7. Anemia -Baseline Hgb looks like 11s-12s, 9s this admit -FOBT negative -Workup per primary   8. CKD IIIb -Baseline Scr 1.3-1.6 -sCr 1.7 today  -Continue Farxiga  - follow BMP daily      Length of Stay: 0  Caffie Shed, PA-C  12/14/2023, 8:32 AM  Advanced Heart Failure Team Pager [phone] (M-F; 7a - 5p)  Please contact [facility] for night-coverage after hours (5p -7a ) and weekends on  [email or web address]

## 2023-12-15 ENCOUNTER — Telehealth: Payer: Self-pay

## 2023-12-15 ENCOUNTER — Other Ambulatory Visit (HOSPITAL_COMMUNITY): Payer: Self-pay

## 2023-12-15 DIAGNOSIS — I272 Pulmonary hypertension, unspecified: Secondary | ICD-10-CM | POA: Diagnosis not present

## 2023-12-15 DIAGNOSIS — I5023 Acute on chronic systolic (congestive) heart failure: Secondary | ICD-10-CM

## 2023-12-15 DIAGNOSIS — R5383 Other fatigue: Secondary | ICD-10-CM

## 2023-12-15 DIAGNOSIS — D649 Anemia, unspecified: Secondary | ICD-10-CM | POA: Diagnosis not present

## 2023-12-15 DIAGNOSIS — N179 Acute kidney failure, unspecified: Secondary | ICD-10-CM | POA: Diagnosis not present

## 2023-12-15 LAB — CBC
HCT: 30.5 % — ABNORMAL LOW (ref 36.0–46.0)
Hemoglobin: 9.7 g/dL — ABNORMAL LOW (ref 12.0–15.0)
MCH: 29 pg (ref 26.0–34.0)
MCHC: 31.8 g/dL (ref 30.0–36.0)
MCV: 91.3 fL (ref 80.0–100.0)
Platelets: 234 K/uL (ref 150–400)
RBC: 3.34 MIL/uL — ABNORMAL LOW (ref 3.87–5.11)
RDW: 15.9 % — ABNORMAL HIGH (ref 11.5–15.5)
WBC: 4.7 K/uL (ref 4.0–10.5)
nRBC: 0 % (ref 0.0–0.2)

## 2023-12-15 LAB — GLUCOSE, CAPILLARY
Glucose-Capillary: 151 mg/dL — ABNORMAL HIGH (ref 70–99)
Glucose-Capillary: 147 mg/dL — ABNORMAL HIGH (ref 70–99)

## 2023-12-15 LAB — BASIC METABOLIC PANEL WITH GFR
Anion gap: 11 (ref 5–15)
BUN: 34 mg/dL — ABNORMAL HIGH (ref 8–23)
CO2: 22 mmol/L (ref 22–32)
Calcium: 9.3 mg/dL (ref 8.9–10.3)
Chloride: 104 mmol/L (ref 98–111)
Creatinine, Ser: 1.82 mg/dL — ABNORMAL HIGH (ref 0.44–1.00)
GFR, Estimated: 30 mL/min — ABNORMAL LOW (ref >60)
Glucose, Bld: 99 mg/dL (ref 70–99)
Potassium: 4.3 mmol/L (ref 3.5–5.1)
Sodium: 137 mmol/L (ref 135–145)

## 2023-12-15 MED ORDER — DIGOXIN 125 MCG PO TABS
0.1250 mg | ORAL_TABLET | Freq: Every day | ORAL | 0 refills | Status: DC
  Filled 2023-12-15: qty 30, 30d supply, fill #0

## 2023-12-15 MED ORDER — SENNOSIDES-DOCUSATE SODIUM 8.6-50 MG PO TABS: 1.0000 | ORAL_TABLET | Freq: Two times a day (BID) | ORAL | Status: DC

## 2023-12-15 MED ORDER — FUROSEMIDE 40 MG PO TABS
60.0000 mg | ORAL_TABLET | Freq: Two times a day (BID) | ORAL | 1 refills | Status: DC
  Filled 2023-12-15: qty 90, 30d supply, fill #0

## 2023-12-15 MED ORDER — ALBUTEROL SULFATE (2.5 MG/3ML) 0.083% IN NEBU
2.5000 mg | INHALATION_SOLUTION | Freq: Once | RESPIRATORY_TRACT | Status: AC
  Administered 2023-12-15: 2.5 mg via RESPIRATORY_TRACT
  Filled 2023-12-15: qty 3

## 2023-12-15 MED ORDER — POLYETHYLENE GLYCOL 3350 17 G PO PACK
17.0000 g | PACK | Freq: Every day | ORAL | Status: DC
  Filled 2023-12-15: qty 1

## 2023-12-15 NOTE — Progress Notes (Signed)
 Mobility Specialist Progress Note:  Nurse requested Mobility Specialist to perform oxygen saturation test with pt which includes removing pt from oxygen both at rest and while ambulating.  Below are the results from that testing.     Patient Saturations on Room Air at Rest = spO2 97%  Patient Saturations on Room Air while Ambulating = sp02 95% .    At end of testing pt left in room on 0  Liters of oxygen.  Reported results to nurse.    Thersia Minder Mobility Specialist  Please contact vis Secure Chat or  Rehab Office [phone]

## 2023-12-15 NOTE — Discharge Instructions (Signed)
 Please review your medications carefully.  Cardiology has stopped your metoprolol  and started digoxin .  You can restart your lasix  tomorrow.  Please follow up with your cardiologist as scheduled and recheck your blood work

## 2023-12-15 NOTE — Telephone Encounter (Signed)
Patient given earlier appointment  

## 2023-12-15 NOTE — Progress Notes (Addendum)
 Advanced Heart Failure Rounding Note  Cardiologist: Oneil Parchment, MD  AHF: Dr. Cherrie  Chief Complaint: SOB, a/c RV Failure and Pulmonary HTN    Patient Profile   68 YO F w/ pulmonary hypertension complicated by RVF/cor pulmonale, CAD, morbid obesity, OSA, HTN, tobacco use, restrictive lung disease, HTN, T2DM currently admitted with hypoxia/volume overload 2/2 PH/RVF.  Echo on admission: LVEF 65%; reduced RV function w/ RVSP of 133   Subjective:    Diuretics held yesterday. Daily wts not charted  sCr  continues to trend up 1.38>>1.60>>1.73>>1.82 BUN 15>>24>>31>>34  Overall breathing much improved, feels back to baseline.   Only complaint is constipation. No BM since admit.     Objective:   Weight Range: 103.4 kg Body mass index is 37.93 kg/m.   Vital Signs:   Temp:  [98.1 F (36.7 C)-98.4 F (36.9 C)] 98.2 F (36.8 C) (08/28 0454) Pulse Rate:  [77-84] 81 (08/28 0454) Resp:  [18] 18 (08/28 0454) BP: (90-123)/(50-54) 110/54 (08/28 0454) SpO2:  [90 %-100 %] 90 % (08/28 0454) Last BM Date : 12/11/23  Weight change: Filed Weights   12/11/23 1257 12/12/23 0401  Weight: 100.3 kg 103.4 kg    Intake/Output:   Intake/Output Summary (Last 24 hours) at 12/15/2023 0743 Last data filed at 12/15/2023 0558 Gross per 24 hour  Intake 0 ml  Output --  Net 0 ml      Physical Exam   GENERAL: NAD Lungs- clear CARDIAC:  JVP: 8 cm         Normal rate with regular rhythm. 2/6 TR  ABDOMEN: Soft, non-tender, non-distended.  EXTREMITIES: Warm and well perfused.  NEUROLOGIC: No obvious FND   Telemetry   NSR 80s, personally reviewed   EKG    N/A   Labs    CBC Recent Labs    12/14/23 0448 12/15/23 0526  WBC 4.9 4.7  HGB 9.3* 9.7*  HCT 28.9* 30.5*  MCV 90.6 91.3  PLT 215 234   Basic Metabolic Panel Recent Labs    91/73/74 1122 12/14/23 0448 12/15/23 0526  NA 138 139 137  K 4.0 4.1 4.3  CL 105 106 104  CO2 23 22 22   GLUCOSE 97 141* 99  BUN  24* 31* 34*  CREATININE 1.60* 1.73* 1.82*  CALCIUM  9.0 8.7* 9.3  MG 2.3  --   --    Liver Function Tests Recent Labs    12/13/23 1122  AST 22  ALT 15  ALKPHOS 84  BILITOT 0.6  PROT 6.8  ALBUMIN 3.1*   No results for input(s): LIPASE, AMYLASE in the last 72 hours. Cardiac Enzymes No results for input(s): CKTOTAL, CKMB, CKMBINDEX, TROPONINI in the last 72 hours.  BNP: BNP (last 3 results) Recent Labs    05/04/23 1545 08/15/23 1606 12/11/23 1313  BNP 759.2* 566.5* 673.3*    ProBNP (last 3 results) No results for input(s): PROBNP in the last 8760 hours.   D-Dimer No results for input(s): DDIMER in the last 72 hours. Hemoglobin A1C No results for input(s): HGBA1C in the last 72 hours.  Fasting Lipid Panel No results for input(s): CHOL, HDL, LDLCALC, TRIG, CHOLHDL, LDLDIRECT in the last 72 hours. Thyroid  Function Tests No results for input(s): TSH, T4TOTAL, T3FREE, THYROIDAB in the last 72 hours.  Invalid input(s): FREET3  Other results:   Imaging    No results found.   Medications:     Scheduled Medications:  albuterol   2.5 mg Nebulization Once   atorvastatin   10  mg Oral Daily   cyanocobalamin   1,000 mcg Oral Daily   dapagliflozin  propanediol  10 mg Oral Daily   digoxin   0.125 mg Oral Daily   enoxaparin  (LOVENOX ) injection  40 mg Subcutaneous Q24H   gabapentin   300 mg Oral BID   insulin  aspart  0-15 Units Subcutaneous TID WC   insulin  aspart  0-5 Units Subcutaneous QHS   macitentan   10 mg Oral Daily   pantoprazole   40 mg Oral BID   sodium chloride  flush  3 mL Intravenous Q12H   tadalafil   40 mg Oral Daily    Infusions:   PRN Medications: acetaminophen , albuterol , ondansetron  (ZOFRAN ) IV, sodium chloride  flush      Assessment/Plan    1. Pulmonary HTN with RV failure/cor pulmonale - Echo (12/22): EF 65-70% RV moderately dilated with moderate HK, flattened septum, moderate TR, RVSP est 110 mmHG   -  Chest CT scan (2/22): No PE - RHC (12/22): RA 5 PA 79/18 (39) PCW 12  CO 6.4 L/min; CI PVR 4.2 - PFTs with significant restrictive/obstructive lung disease - ANA, ANCA, [place]-70, RF all negative. - Sleep study with mild OSA.  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  - Echo this admit with LVEF 60-65%, septal flattening in systole and diastole, RV severely enlarged and moderately HK, RVSP 133 mmHg, RA severe dilated, severe TR, dilated IVC - NYHA III. Improved with diuresis. Euvolemic on exam  - Hold diuretics again today. Can restart at home tomorrow, Lasix  60 mg bid  - Can separate Opsynvi into opsumit /macitentan  while here - Continue Farxiga  10 mg daily. - Metoprolol  discontinued  - continue digoxin  0.125 mg daily  - ambulatory O2 sats on RA remained > 93% per PT    2. CAD - CABG 2005 LIMA-> LAD - Cath (04/16/21): mLAD 75% LIMA to LAD is widely patent. pRCA 50 - stable w/o CP  - Continue statin. Restart aspirin  prior to discharge.   3. Morbid obesity - Body mass index is 37.93 kg/m. - No longer on ozempic    4. OSA - Mild on sleep study. Not on CPAP - Has follow up with Pulm - Overnight oxymetry ordered at f/u in June, unable to find a result in the chart   5. HTN - controlled, SBPs 110s    6. Smoking - Recommend cessation   7. Iron Deficiency Anemia - Baseline Hgb looks like 11s-12s, 9s this admit - FOBT negative - Ferritin 14, T sat 13%  - will give IV Fe    8. CKD IIIb -Baseline Scr 1.3-1.6 -trend this admit 1.38>>1.60>>1.73>>1.82 -hold diuretics again today  -Continue Farxiga  -follow BMP daily    9. Constipation  - add Miralax  and senokot   Ok for d/c home from HF standpoint. We will arrange clinic f/u in 1 wk and place appt info in AVS  Cardiac/PH  Meds for Discharge  Atorvastatin  10 mg daily  Farxiga  10 mg daily  Digoxin  0.125 mg daily (will check dig level at f/u) Opsynvi 10-40 mg daily  Lasix  60 mg bid (starting 12/16/23)   Length of Stay: 1  Perry Molla, PA-C  12/15/2023, 7:43 AM  Advanced Heart Failure Team Pager [phone] (M-F; 7a - 5p)  Please contact [facility] for night-coverage after hours (5p -7a ) and weekends on [email or web address]

## 2023-12-15 NOTE — Progress Notes (Signed)
 AVS reviewed and answered all her questions.  IV removed and cardiac monitoring removed.  Discharge lounge called to pick this patient as her daughter is going to pick after an hour.

## 2023-12-15 NOTE — Telephone Encounter (Signed)
 Copied from CRM #[id number]. Topic: Appointments - Appointment Scheduling >> Dec 15, 2023 12:53 PM Emylou G wrote: F/u hosp.. heart failure.SABRA Pls call daughter.. unable to find f/u appt w/PCP within 14 days. can we try someone else?

## 2023-12-15 NOTE — Progress Notes (Signed)
 Physical Therapy Treatment Patient Details Name: Deborah Coffey MRN: [id number] DOB: 1955-10-09 Today's Date: 12/15/2023   History of Present Illness Pt is a 68 y/o F admitted on 12/11/23 after presenting with c/o SOB & LE swelling. Pt is being treated for DOE 2/2 CHF vs anemia. PMH: CHF, RHC March 2025, DM2, HTN, anemia, depression, TIA    PT Comments  Focused session on providing education on managing her CHF and DME prior to anticipated d/c home today. Educated pt on managing her CH by limiting sodium intake and weighing herself daily, reveiwing her CHF booklet. Educated pt on how to adjust and manage her bedside commode and rollator. Educated pt to lock her brakes on the rollator with transfers. She verbalized understanding. Pt observed ambulating mod I with rollator in hallway earlier today. She is currently mobilizing in the room mod I, intermittently holding onto furniture.   If plan is discharge home, recommend the following: A little help with bathing/dressing/bathroom;Assistance with cooking/housework;Assist for transportation   Can travel by private vehicle        Equipment Recommendations  Rollator (4 wheels)    Recommendations for Other Services       Precautions / Restrictions Precautions Precautions: None Restrictions Weight Bearing Restrictions Per Provider Order: No     Mobility  Bed Mobility               General bed mobility comments: Pt sitting on couch upon arrival and standing in room at end of session.    Transfers Overall transfer level: Independent Equipment used: None Transfers: Sit to/from Stand Sit to Stand: Independent           General transfer comment: No LOB or assistance needed to transfer    Ambulation/Gait Ambulation/Gait assistance: Modified independent (Device/Increase time) Gait Distance (Feet): 5 Feet Assistive device: None (intermittent furniture support) Gait Pattern/deviations: Decreased step length - right,  Decreased step length - left, Decreased stride length, Step-through pattern Gait velocity: decreased Gait velocity interpretation: 1.31 - 2.62 ft/sec, indicative of limited community ambulator   General Gait Details: Pt ambulates in the room with intermittent UE support on furniture without LOB. Pt observed ambulating in hall mod I with rollator earlier today.   Stairs             Wheelchair Mobility     Tilt Bed    Modified Rankin (Stroke Patients Only)       Balance Overall balance assessment: Mild deficits observed, not formally tested                                          Communication Communication Communication: No apparent difficulties  Cognition Arousal: Alert Behavior During Therapy: WFL for tasks assessed/performed   PT - Cognitive impairments: No apparent impairments                         Following commands: Intact      Cueing Cueing Techniques: Verbal cues  Exercises      General Comments General comments (skin integrity, edema, etc.): Educated pt on managing her CHF, reveiwing her CHF booklet. Educated pt on how to adjust and manage her bedside commode and rollator. Educated pt to lock her brakes on the rollator with transfers. She verbalized understanding.      Pertinent Vitals/Pain Pain Assessment Pain Assessment: Faces Faces Pain Scale: No hurt Pain Intervention(s):  Monitored during session    Home Living                          Prior Function            PT Goals (current goals can now be found in the care plan section) Acute Rehab PT Goals Patient Stated Goal: get better PT Goal Formulation: With patient Time For Goal Achievement: 12/26/23 Potential to Achieve Goals: Good Progress towards PT goals: Progressing toward goals    Frequency    Min 1X/week      PT Plan      Co-evaluation              AM-PAC PT 6 Clicks Mobility   Outcome Measure  Help needed turning from  your back to your side while in a flat bed without using bedrails?: None Help needed moving from lying on your back to sitting on the side of a flat bed without using bedrails?: None Help needed moving to and from a bed to a chair (including a wheelchair)?: None Help needed standing up from a chair using your arms (e.g., wheelchair or bedside chair)?: None Help needed to walk in hospital room?: None Help needed climbing 3-5 steps with a railing? : A Little 6 Click Score: 23    End of Session   Activity Tolerance: Patient tolerated treatment well Patient left: with call bell/phone within reach;Other (comment) (standing in room) Nurse Communication: Mobility status PT Visit Diagnosis: Muscle weakness (generalized) (M62.81);Other abnormalities of gait and mobility (R26.89);Unsteadiness on feet (R26.81)     Time: 8747-8694 PT Time Calculation (min) (ACUTE ONLY): 13 min  Charges:    $Therapeutic Activity: 8-22 mins PT General Charges $$ ACUTE PT VISIT: 1 Visit                     Theo Ferretti, PT, DPT Acute Rehabilitation Services  Office: [phone]    Theo CHRISTELLA Ferretti 12/15/2023, 1:20 PM

## 2023-12-15 NOTE — Progress Notes (Incomplete)
 Progress Note Patient: Deborah Coffey FMW:[id number] DOB: 01-22-1956 DOA: 12/11/2023     1 DOS: the patient was seen and examined on 12/15/2023   Brief hospital course: Deborah Coffey is a 68 y.o. female with medical history significant of CHF, RHC March 2025 noting severe PAH with cor pulmonale, type 2 diabetes mellitus, hypertension presented to ED with a complaint of shortness of breath and leg swelling.  BNP 673.3, chest x-ray showed cardiomegaly, vascular congestion.  Admitted to TRH service for further management evaluation of CHF exacerbation.  Assessment and Plan: Acute on chronic CHF with preserved ejection fraction Pulmonary artery hypertension with RV failure/ Cor pulmonale- Possibly causing worsening shortness of breath. Patient got IV Lasix  in the emergency department with improvement of symptoms. Echo showed RV severely enlarged and moderately HK, RVSP 133 mmHg, RA severe dilated, severe TR. Started Opsynvi home dose 12/13/23 per pharmacy protocol. IV Lasix  80 mg until yesterday but held due to kidney dysfunction. Digoxin , Bronchodilators added. Follow heart failure team recommendations. Monitor daily weights, strict input, output, daily renal function, electrolytes.  Acute on chronic anemia: Patient's baseline hemoglobin around 11-12. Presented with hemoglobin 9.5, seems stable. Iron panel, B12 and folate unremarkable. FOBT x 1 negative.  No active bleeding.  Continue to monitor H&H.  Type 2 diabetes mellitus: A1c 6.8. Stopped lantus  due to low blood sugars. Continue Accu-Cheks, sliding scale insulin .  Essential hypertension: Resumed metoprolol .  Hyperlipidemia: Continue atorvastatin .  Acute on CKD stage IIIb: Creatinine worsening due to diuresis. Monitor daily renal function. Diuretic held.  GERD continue PPI.  Obesity Class II- BMI 37.93 Diet, exercise and weight reduction advised.  {Tip this will not be part of the note when signed Body mass index  is 37.93 kg/m. , ,  (Optional):26781}   Out of bed to chair. Incentive spirometry. Nursing supportive care. Fall, aspiration precautions. Diet:  Diet Orders (From admission, onward)     Start     Ordered   12/11/23 1858  Diet 2 gram sodium Room service appropriate? Yes; Fluid consistency: Thin  Diet effective now       Question Answer Comment  Room service appropriate? Yes   Fluid consistency: Thin      12/11/23 1859           DVT prophylaxis: enoxaparin  (LOVENOX ) injection 40 mg Start: 12/11/23 1900  Level of care: Telemetry Cardiac   Code Status: Limited: Do not attempt resuscitation (DNR) -DNR-LIMITED -Do Not Intubate/DNI   Subjective: Patient is seen and examined today morning. She is sitting on edge of bed, feels short of breath better. Eating fair, getting out of bed.  Physical Exam: Vitals:   12/14/23 2014 12/14/23 2031 12/15/23 0454 12/15/23 0847  BP:  (!) 123/50 (!) 110/54 (!) 142/64  Pulse:  84 81 77  Resp:  18 18 18   Temp:  98.1 F (36.7 C) 98.2 F (36.8 C) 98.1 F (36.7 C)  TempSrc:  Oral Oral   SpO2: 94% 100% 90% 97%  Weight:      Height:        General - Elderly African American female, mild respiratory distress HEENT - PERRLA, EOMI, atraumatic head, non tender sinuses. Lung -distant breath sounds, basal rales, diffuse rhonchi, wheezes. Heart - S1, S2 heard, no murmurs, rubs, trace pedal edema. Abdomen - Soft, non tender, obese, bowel sounds good Neuro - Alert, awake and oriented x 3, non focal exam. Skin - Warm and dry.  Data Reviewed:      Latest Ref Rng & Units  12/15/2023    5:26 AM 12/14/2023    4:48 AM 12/12/2023    5:37 AM  CBC  WBC 4.0 - 10.5 K/uL 4.7  4.9  5.3   Hemoglobin 12.0 - 15.0 g/dL 9.7  9.3  9.4   Hematocrit 36.0 - 46.0 % 30.5  28.9  30.0   Platelets 150 - 400 K/uL 234  215  248       Latest Ref Rng & Units 12/15/2023    5:26 AM 12/14/2023    4:48 AM 12/13/2023   11:22 AM  BMP  Glucose 70 - 99 mg/dL 99  858  97   BUN  8 - 23 mg/dL 34  31  24   Creatinine 0.44 - 1.00 mg/dL 8.17  8.26  8.39   Sodium 135 - 145 mmol/L 137  139  138   Potassium 3.5 - 5.1 mmol/L 4.3  4.1  4.0   Chloride 98 - 111 mmol/L 104  106  105   CO2 22 - 32 mmol/L 22  22  23    Calcium  8.9 - 10.3 mg/dL 9.3  8.7  9.0    No results found.   Family Communication: Discussed with patient, she understand and agree. All questions answered.  Disposition: Status is: Observation The patient will require care spanning > 2 midnights and should be moved to inpatient because: digoxin  therapy, AKI, debility  Planned Discharge Destination: Home with Home Health  {Tip this will not be part of the note when signed  DVT Prophylaxis  ., Enoxaparin  (lovenox ) injection 40 mg  (Optional):26781}   Time spent: 46 minutes  Author: Marien LITTIE Piety, MD 12/15/2023 8:56 AM Secure chat 7am to 7pm For on call review [email or web address].

## 2023-12-15 NOTE — TOC Transition Note (Signed)
 Transition of Care [facility]) - Discharge Note   Patient Details  Name: Deborah Coffey MRN: [id number] Date of Birth: 1955-05-20  Transition of Care [facility] CM/SW Contact:  Justina Delcia Czar, RN Phone Number: [phone] 12/15/2023, 1:04 PM   Clinical Narrative:     Spoke to pt at bedside. Rollator at bedside. Pt states she lives at home with her dtr. States he has scale for daily weights. Reviewed Living Better with HF booklet. Pt states she monitors her sodium intake. She cooks at home.   Dtr will provide transportation home. Contacted PCP office and they clinic RN will have to call with an appt. No available appts in the 2 weeks.   Final next level of care: Home/Self Care Barriers to Discharge: No Barriers Identified   Patient Goals and CMS Choice Patient states their goals for this hospitalization and ongoing recovery are:: wants to remain independent CMS Medicare.gov Compare Post Acute Care list provided to:: Patient Choice offered to / list presented to : Patient      Discharge Placement                       Discharge Plan and Services Additional resources added to the After Visit Summary for   In-house Referral: Clinical Social Work Discharge Planning Services: CM Consult Post Acute Care Choice: Durable Medical Equipment          DME Arranged: Vannie rolling with seat, 3-N-1 DME Agency: [employer] Date DME Agency Contacted: 12/13/23 Time DME Agency Contacted: 1600 Representative spoke with at DME Agency: [place]            Social Drivers of Health (SDOH) Interventions SDOH Screenings   Food Insecurity: No Food Insecurity (12/12/2023)  Housing: Low Risk  (12/12/2023)  Transportation Needs: No Transportation Needs (12/12/2023)  Utilities: Not At Risk (12/12/2023)  Alcohol Screen: Low Risk  (08/23/2023)  Depression (PHQ2-9): Low Risk  (08/23/2023)  Financial Resource Strain: Low Risk  (08/23/2023)  Physical Activity: Inactive (08/23/2023)  Social  Connections: Unknown (12/12/2023)  Stress: No Stress Concern Present (08/23/2023)  Tobacco Use: High Risk (12/11/2023)  Health Literacy: Adequate Health Literacy (08/23/2023)     Readmission Risk Interventions     No data to display

## 2023-12-15 NOTE — Discharge Summary (Signed)
 Physician Discharge Summary  Patient: Deborah Coffey FMW:[id number] DOB: Dec 26, 1955   Code Status: Limited: Do not attempt resuscitation (DNR) -DNR-LIMITED -Do Not Intubate/DNI  Admit date: 12/11/2023 Discharge date: 12/15/2023 Disposition: Home, No home health services recommended PCP: [name], MD  Recommendations for Outpatient Follow-up:  Follow up with PCP within 1-2 weeks Regarding general hospital follow up and preventative care Recommend BMP. AKI with Cr 1.82 on day of dc. Diuretics held Follow up cardiology  Regarding heart failure  Discharge Diagnoses:  Principal Problem:   Anemia Active Problems:   Acute on chronic diastolic CHF (congestive heart failure) (HCC)   Other fatigue   Acute on chronic systolic congestive heart failure [facility])  Brief Hospital Course Summary: Deborah Coffey is a 68 y.o. female with medical history significant of CHF, RHC March 2025 noting severe PAH with cor pulmonale, type 2 diabetes mellitus, hypertension presented to ED with a complaint of shortness of breath and leg swelling.  BNP 673.3, chest x-ray showed cardiomegaly, vascular congestion.  Admitted to TRH service for further management evaluation of CHF exacerbation.   Acute on chronic CHF with preserved ejection fraction Pulmonary artery hypertension with RV failure/ Cor pulmonale- Patient got IV Lasix  in the emergency department with improvement of symptoms. Echo showed RV severely enlarged and moderately HK, RVSP 133 mmHg, RA severe dilated, severe TR. Started Opsynvi home dose 12/13/23 per pharmacy protocol.  As a result of diuresis she developed an AKI so it was stopped.  Her breathing remained stable and O2 was >94% with ambulation. LE swelling improved.  Cardiology was consulted and Digoxin  was added to her regimen.  Home metoprolol  was stopped.   Acute on CKD stage IIIb: Creatinine worsening due to diuresis. Cr 1.82 on day of dc with good UOP.  Diuretic held and to  resume tomorrow.  Instructed to get BMP recheck at follow up.   All other chronic conditions were treated with home medications.    Discharge Condition: Good, improved Recommended discharge diet: Cardiac diet  Consultations: Cardiology   Procedures/Studies: Echo   Allergies as of 12/15/2023       Reactions   Ciprofloxacin Rash        Medication List     PAUSE taking these medications    furosemide  40 MG tablet Wait to take this until: December 16, 2023 Commonly known as: LASIX  Take 40-80 mg by mouth See admin instructions. Take 80 mg by mouth in the morning and 40 mg at night       STOP taking these medications    metoprolol  tartrate 50 MG tablet Commonly known as: LOPRESSOR        TAKE these medications    acetaminophen  500 MG tablet Commonly known as: TYLENOL  Take 1,000 mg by mouth every 6 (six) hours as needed for headache or moderate pain (pain score 4-6).   aspirin  EC 81 MG tablet Take 81 mg by mouth daily.   atorvastatin  10 MG tablet Commonly known as: LIPITOR TAKE 1 TABLET ONE TIME DAILY   B-D SINGLE USE SWABS REGULAR Pads USE AS DIRECTED   cyanocobalamin  1000 MCG tablet Commonly known as: VITAMIN B12 Take 1,000 mcg by mouth daily.   Dexcom G6 Sensor Misc Use to check blood sugar as directed. Dx. E11.59   diclofenac  Sodium 1 % Gel Commonly known as: VOLTAREN  Apply 2 g topically daily as needed (muscle pain).   digoxin  0.125 MG tablet Commonly known as: LANOXIN  Take 1 tablet (0.125 mg total) by mouth daily. Start taking  on: December 16, 2023   Droplet Pen Needles 31G X 8 MM Misc Generic drug: Insulin  Pen Needle USE WITH TOUJEO  SOLOSTAR PEN EVERY DAY   Farxiga  10 MG Tabs tablet Generic drug: dapagliflozin  propanediol TAKE 1 TABLET EVERY DAY BEFORE BREAKFAST   gabapentin  300 MG capsule Commonly known as: NEURONTIN  Take 300 mg by mouth 2 (two) times daily.   metFORMIN  500 MG 24 hr tablet Commonly known as: GLUCOPHAGE -XR Take 2  tablets (1,000 mg total) by mouth 2 (two) times daily. What changed: how much to take   multivitamin with minerals Tabs tablet Take 1 tablet by mouth daily.   nitroGLYCERIN  0.4 MG SL tablet Commonly known as: NITROSTAT  Place 1 tablet (0.4 mg total) under the tongue every 5 (five) minutes as needed for chest pain.   OneTouch Delica Plus Lancet33G Misc Use as instructed to test blood glucose 3 times daily.   OneTouch Verio test strip Generic drug: glucose blood Use as instructed to test blood glucose 3 times daily.   OneTouch Verio w/Device Kit Use as instructed to test blood glucose 3 times daily.   Opsynvi 10-40 MG Tabs Generic drug: Macitentan -Tadalafil  Take 1 tablet by mouth in the morning.   pantoprazole  40 MG tablet Commonly known as: PROTONIX  TAKE 1 TABLET TWICE DAILY   Toujeo  Max SoloStar 300 UNIT/ML Solostar Pen Generic drug: insulin  glargine (2 Unit Dial ) INJECT 50 UNITS INTO THE SKIN DAILY (DOSE DECREASE) What changed: See the new instructions.   Vitamin D  (Cholecalciferol) 25 MCG (1000 UT) Tabs Take 1,000 mcg by mouth daily.               Durable Medical Equipment  (From admission, onward)           Start     Ordered   12/13/23 1558  For home use only DME 3 n 1  Once        12/13/23 1558   12/13/23 1239  For home use only DME 4 wheeled rolling walker with seat  Once       Question Answer Comment  Patient needs a walker to treat with the following condition Physical deconditioning   Patient needs a walker to treat with the following condition Heart failure (HCC)      12/13/23 1250            Follow-up Information     [facility] Follow up.   Specialty: Cardiology Why: 12/22/23 at 9:30 AM   Hospital Follow up in the Advanced Heart Failure Clinic at [facility], Deborah Coffey Pass information: [address] [place]  [phone]                Subjective   Pt reports feeling well. She has no CP nor SOB at rest. When walking with the nurse she did get short of breath going down the hall but no oxygen desaturations.  All questions and concerns were addressed at time of discharge.  Objective  Blood pressure (!) 142/64, pulse 77, temperature 98.1 F (36.7 C), resp. rate 18, height 5' 5 (1.651 m), weight 103.4 kg, SpO2 97%.   General: Pt is alert, awake, not in acute distress Cardiovascular: RRR, S1/S2 +, no rubs, no gallops Respiratory: CTA bilaterally, no wheezing, no rhonchi Abdominal: Soft, NT, ND, bowel sounds + Extremities: no edema, no cyanosis  The results of significant diagnostics from this hospitalization (including imaging, microbiology, ancillary and laboratory) are listed below for reference.  Imaging studies: ECHOCARDIOGRAM COMPLETE Result Date: 12/12/2023    ECHOCARDIOGRAM REPORT   Patient Name:   Kadedra Alydia Gosser Date of Exam: 12/12/2023 Medical Rec #:  [id number]          Height:       65.0 in Accession #:    [id number]         Weight:       228.0 lb Date of Birth:  1956-01-22         BSA:          2.091 m Patient Age:    67 years           BP:           113/64 mmHg Patient Gender: F                  HR:           88 bpm. Exam Location:  Inpatient Procedure: 2D Echo, Color Doppler and Cardiac Doppler (Both Spectral and Color            Flow Doppler were utilized during procedure). Indications:    CHF- Acute Systolic I50.21  History:        Patient has prior history of Echocardiogram examinations, most                 recent 02/25/2023. CAD, Prior CABG; Risk Factors:Hypertension,                 Dyslipidemia, Diabetes and Current Smoker.  Sonographer:    Koleen Popper RDCS Referring Phys: [id number] RAVI PAHWANI  Sonographer Comments: Patient is obese. IMPRESSIONS  1. Left ventricular ejection fraction, by estimation, is 60 to 65%. The left ventricle has normal function. The left ventricle has no regional wall motion  abnormalities. Left ventricular diastolic parameters were normal. There is the interventricular septum is flattened in systole and diastole, consistent with right ventricular pressure and volume overload.  2. Right ventricular systolic function is moderately reduced. The right ventricular size is severely enlarged. There is severely elevated pulmonary artery systolic pressure. The estimated right ventricular systolic pressure is 133.8 mmHg.  3. Right atrial size was severely dilated.  4. The mitral valve is normal in structure. No evidence of mitral valve regurgitation. No evidence of mitral stenosis.  5. Tricuspid valve regurgitation is severe.  6. The aortic valve is tricuspid. Aortic valve regurgitation is not visualized. Aortic valve sclerosis/calcification is present, without any evidence of aortic stenosis.  7. Pulmonic valve regurgitation is moderate.  8. The inferior vena cava is dilated in size with <50% respiratory variability, suggesting right atrial pressure of 15 mmHg. FINDINGS  Left Ventricle: Left ventricular ejection fraction, by estimation, is 60 to 65%. The left ventricle has normal function. The left ventricle has no regional wall motion abnormalities. The left ventricular internal cavity size was normal in size. There is  no left ventricular hypertrophy. The interventricular septum is flattened in systole and diastole, consistent with right ventricular pressure and volume overload. Left ventricular diastolic parameters were normal. Normal left ventricular filling pressure. Right Ventricle: The right ventricular size is severely enlarged. No increase in right ventricular wall thickness. Right ventricular systolic function is moderately reduced. There is severely elevated pulmonary artery systolic pressure. The tricuspid regurgitant velocity is 5.45 m/s, and with an assumed right atrial pressure of 15 mmHg, the estimated right ventricular systolic pressure is 133.8 mmHg. Left Atrium: Left atrial  size was normal in size. Right Atrium: Right atrial size was  severely dilated. Pericardium: There is no evidence of pericardial effusion. Mitral Valve: The mitral valve is normal in structure. Mild to moderate mitral annular calcification. No evidence of mitral valve regurgitation. No evidence of mitral valve stenosis. Tricuspid Valve: The tricuspid valve is normal in structure. Tricuspid valve regurgitation is severe. No evidence of tricuspid stenosis. Aortic Valve: The aortic valve is tricuspid. Aortic valve regurgitation is not visualized. Aortic valve sclerosis/calcification is present, without any evidence of aortic stenosis. Pulmonic Valve: The pulmonic valve was normal in structure. Pulmonic valve regurgitation is moderate. No evidence of pulmonic stenosis. Aorta: The aortic root is normal in size and structure. Venous: The inferior vena cava is dilated in size with less than 50% respiratory variability, suggesting right atrial pressure of 15 mmHg. IAS/Shunts: No atrial level shunt detected by color flow Doppler.  LEFT VENTRICLE PLAX 2D LVIDd:         4.10 cm   Diastology LVIDs:         3.20 cm   LV e' medial:    7.72 cm/s LV PW:         1.10 cm   LV E/e' medial:  9.3 LV IVS:        1.00 cm   LV e' lateral:   13.70 cm/s LVOT diam:     1.80 cm   LV E/e' lateral: 5.2 LV SV:         49 LV SV Index:   23 LVOT Area:     2.54 cm  RIGHT VENTRICLE            IVC RV S prime:     5.72 cm/s  IVC diam: 2.30 cm TAPSE (M-mode): 1.4 cm LEFT ATRIUM           Index        RIGHT ATRIUM           Index LA diam:      4.20 cm 2.01 cm/m   RA Area:     23.30 cm LA Vol (A2C): 34.9 ml 16.69 ml/m  RA Volume:   76.20 ml  36.44 ml/m LA Vol (A4C): 68.9 ml 32.95 ml/m  AORTIC VALVE LVOT Vmax:   89.60 cm/s LVOT Vmean:  58.200 cm/s LVOT VTI:    0.192 m  AORTA Ao Root diam: 2.70 cm Ao Asc diam:  2.90 cm MITRAL VALVE               TRICUSPID VALVE MV Area (PHT): 4.60 cm    TR Peak grad:   118.8 mmHg MV Decel Time: 165 msec    TR Vmax:         545.00 cm/s MV E velocity: 71.80 cm/s MV A velocity: 67.60 cm/s  SHUNTS MV E/A ratio:  1.06        Systemic VTI:  0.19 m                            Systemic Diam: 1.80 cm Wilbert Bihari MD Electronically signed by Wilbert Bihari MD Signature Date/Time: 12/12/2023/5:33:00 PM    Final    DG Chest 2 View Result Date: 12/11/2023 CLINICAL DATA:  Shortness of breath. EXAM: CHEST - 2 VIEW COMPARISON:  Chest radiograph dated 03/03/2023. FINDINGS: Mild cardiomegaly with mild vascular congestion. No focal consolidation, or pneumothorax. Small bilateral pleural effusions. No acute osseous pathology. Median sternotomy wires. IMPRESSION: Mild cardiomegaly with mild vascular congestion and small bilateral pleural effusions. Electronically Signed  By: Vanetta Chou M.D.   On: 12/11/2023 14:00   MM 3D DIAGNOSTIC MAMMOGRAM BILATERAL BREAST Result Date: 11/28/2023 CLINICAL DATA:  68 year old female pre for annual exam and short-term follow-up of a probably benign left breast asymmetry. EXAM: DIGITAL DIAGNOSTIC BILATERAL MAMMOGRAM WITH TOMOSYNTHESIS AND CAD TECHNIQUE: Bilateral digital diagnostic mammography and breast tomosynthesis was performed. The images were evaluated with computer-aided detection. COMPARISON:  Previous exam(s). ACR Breast Density Category b: There are scattered areas of fibroglandular density. FINDINGS: Right breast: No suspicious mass, distortion, or microcalcifications are identified to suggest presence of malignancy. Left breast: There is a stable asymmetry likely representing overlapping tissue in the upper outer left breast. There are no new suspicious findings elsewhere in the left breast. IMPRESSION: 1. Stable probably benign left breast asymmetry. 2. No mammographic evidence of malignancy in the right breast. RECOMMENDATION: Diagnostic bilateral mammogram in 1 year. I have discussed the findings and recommendations with the patient. If applicable, a reminder letter will be sent to the patient  regarding the next appointment. BI-RADS CATEGORY  3: Probably benign. Electronically Signed   By: Inocente Ast M.D.   On: 11/28/2023 11:41    Labs: Basic Metabolic Panel: Recent Labs  Lab 12/11/23 1313 12/11/23 2014 12/12/23 0537 12/13/23 1122 12/14/23 0448 12/15/23 0526  NA 140  --  140 138 139 137  K 3.7  --  3.6 4.0 4.1 4.3  CL 110  --  114* 105 106 104  CO2 19*  --  20* 23 22 22   GLUCOSE 106*  --  66* 97 141* 99  BUN 16  --  15 24* 31* 34*  CREATININE 1.33* 1.38* 1.38* 1.60* 1.73* 1.82*  CALCIUM  9.3  --  8.9 9.0 8.7* 9.3  MG  --   --   --  2.3  --   --    CBC: Recent Labs  Lab 12/11/23 1313 12/11/23 2014 12/12/23 0537 12/14/23 0448 12/15/23 0526  WBC 4.9 5.4 5.3 4.9 4.7  NEUTROABS  --   --  3.0  --   --   HGB 9.5* 9.5* 9.4* 9.3* 9.7*  HCT 30.9* 30.4* 30.0* 28.9* 30.5*  MCV 93.9 91.3 91.7 90.6 91.3  PLT 252 239 248 215 234   Microbiology: Results for orders placed or performed during the hospital encounter of 12/11/23  Resp panel by RT-PCR (RSV, Flu A&B, Covid) Anterior Nasal Swab     Status: None   Collection Time: 12/11/23  2:05 PM   Specimen: Anterior Nasal Swab  Result Value Ref Range Status   SARS Coronavirus 2 by RT PCR NEGATIVE NEGATIVE Final   Influenza A by PCR NEGATIVE NEGATIVE Final   Influenza B by PCR NEGATIVE NEGATIVE Final    Comment: (NOTE) The Xpert Xpress SARS-CoV-2/FLU/RSV plus assay is intended as an aid in the diagnosis of influenza from Nasopharyngeal swab specimens and should not be used as a sole basis for treatment. Nasal washings and aspirates are unacceptable for Xpert Xpress SARS-CoV-2/FLU/RSV testing.  Fact Sheet for Patients: BloggerCourse.com  Fact Sheet for Healthcare Providers: SeriousBroker.it  This test is not yet approved or cleared by the [place]  FDA and has been authorized for detection and/or diagnosis of SARS-CoV-2 by FDA under an Emergency Use  Authorization (EUA). This EUA will remain in effect (meaning this test can be used) for the duration of the COVID-19 declaration under Section 564(b)(1) of the Act, 21 U.S.C. section 360bbb-3(b)(1), unless the authorization is terminated or revoked.     Resp Syncytial  Virus by PCR NEGATIVE NEGATIVE Final    Comment: (NOTE) Fact Sheet for Patients: BloggerCourse.com  Fact Sheet for Healthcare Providers: SeriousBroker.it  This test is not yet approved or cleared by the [place]  FDA and has been authorized for detection and/or diagnosis of SARS-CoV-2 by FDA under an Emergency Use Authorization (EUA). This EUA will remain in effect (meaning this test can be used) for the duration of the COVID-19 declaration under Section 564(b)(1) of the Act, 21 U.S.C. section 360bbb-3(b)(1), unless the authorization is terminated or revoked.  Performed at [facility], [address], [place], [place] [address]     Time coordinating discharge: Over 30 minutes  Marien LITTIE Piety, MD  [facility] 12/15/2023, 12:31 PM

## 2023-12-15 NOTE — Progress Notes (Signed)
 Ambulating O2 saturation sustained in between 95-97% on RA

## 2023-12-15 NOTE — Progress Notes (Signed)
 Mobility Specialist Progress Note:    12/15/23 1130  Mobility  Activity Ambulated with assistance  Level of Assistance Standby assist, set-up cues, supervision of patient - no hands on  Assistive Device Four wheel walker  Distance Ambulated (ft) 250 ft  Activity Response Tolerated well  Mobility Referral Yes  Mobility visit 1 Mobility  Mobility Specialist Start Time (ACUTE ONLY) [id number]  Mobility Specialist Stop Time (ACUTE ONLY) 0957  Mobility Specialist Time Calculation (min) (ACUTE ONLY) 15 min   Received pt in bed having no complaints and agreeable to mobility. Pt was asymptomatic throughout ambulation. Ambulated on RA no drop in SPO2 (stayed between 97%-97%). Returned to room w/o fault. Left in bed w/ call bell in reach and all needs met.   Thersia Minder Mobility Specialist  Please contact vis Secure Chat or  Rehab Office [phone]

## 2023-12-16 ENCOUNTER — Telehealth: Payer: Self-pay | Admitting: *Deleted

## 2023-12-16 DIAGNOSIS — Z79899 Other long term (current) drug therapy: Secondary | ICD-10-CM

## 2023-12-16 DIAGNOSIS — Z59819 Housing instability, housed unspecified: Secondary | ICD-10-CM

## 2023-12-16 NOTE — Transitions of Care (Post Inpatient/ED Visit) (Signed)
 12/16/2023  Name: Deborah Coffey MRN: [id number] DOB: 01-Jun-1955  Today's TOC FU Call Status: Today's TOC FU Call Status:: Successful TOC FU Call Completed TOC FU Call Complete Date: 12/16/23 Patient's Name and Date of Birth confirmed.  Transition Care Management Follow-up Telephone Call Date of Discharge: 12/15/23 Discharge Facility: [facility]) Type of Discharge: Inpatient Admission Primary Inpatient Discharge Diagnosis:: Anemia, Acute on chronic CHF How have you been since you were released from the hospital?: Better Any questions or concerns?: No  Items Reviewed: Did you receive and understand the discharge instructions provided?: Yes Medications obtained,verified, and reconciled?: Yes (Medications Reviewed) Any new allergies since your discharge?: No Dietary orders reviewed?: Yes Type of Diet Ordered:: Heart Healthy, low sodium Do you have support at home?: Yes People in Home [RPT]: grandchild(ren), child(ren), adult Name of Support/Comfort Primary Source: Dtr/Joy  Medications Reviewed Today: Medications Reviewed Today     Reviewed by Lucky Andrea LABOR, RN (Registered Nurse) on 12/16/23 at 1028  Med List Status: <None>   Medication Order Taking? Sig Documenting Provider Last Dose Status Informant  acetaminophen  (TYLENOL ) 500 MG tablet [id number] Yes Take 1,000 mg by mouth every 6 (six) hours as needed for headache or moderate pain (pain score 4-6). [provider]  Active Self, Pharmacy Records  Alcohol Swabs (B-D SINGLE USE SWABS REGULAR) PADS [id number]  USE AS DIRECTED Jegede, Olugbemiga E, MD  Active Self, Pharmacy Records  aspirin  EC 81 MG tablet [id number] Yes Take 81 mg by mouth daily. [provider]  Active Self, Pharmacy Records  atorvastatin  (LIPITOR) 10 MG tablet [id number] Yes TAKE 1 TABLET ONE TIME DAILY Newlin, Enobong, MD  Active Self, Pharmacy Records  Blood Glucose Monitoring Suppl [facility] VERIO) w/Device KIT [id number] Yes Use as  instructed to test blood glucose 3 times daily. Newlin, Enobong, MD  Active Self, Pharmacy Records  Continuous Glucose Sensor (DEXCOM G6 SENSOR) [place] [id number]  Use to check blood sugar as directed. Dx. E11.59  Patient not taking: Reported on 12/16/2023   Newlin, Enobong, MD  Active Self, Pharmacy Records  cyanocobalamin  (VITAMIN B12) 1000 MCG tablet [id number] Yes Take 1,000 mcg by mouth daily. [provider]  Active Self, Pharmacy Records  dapagliflozin  propanediol (FARXIGA ) 10 MG TABS tablet [id number] Yes TAKE 1 TABLET EVERY DAY BEFORE BREAKFAST Newlin, Enobong, MD  Active Self, Pharmacy Records  diclofenac  Sodium (VOLTAREN ) 1 % GEL [id number] Yes Apply 2 g topically daily as needed (muscle pain). Newlin, Enobong, MD  Active Self, Pharmacy Records           Med Note (MARROW, ROCKY ONEIDA Repress Dec 11, 2023  4:22 PM) Pt ran out of this gel prior to admission; however, plans to purchase more   digoxin  (LANOXIN ) 0.125 MG tablet [id number] Yes Take 1 tablet (0.125 mg total) by mouth daily. Piety Marien CROME, MD  Active   furosemide  (LASIX ) 40 MG tablet [id number] Yes Take 1.5 tablets (60 mg total) by mouth 2 (two) times daily. Piety Marien CROME, MD  Active   gabapentin  (NEURONTIN ) 300 MG capsule [id number] Yes Take 300 mg by mouth 2 (two) times daily. [provider]  Active Self, Pharmacy Records  glucose blood [facility] VERIO) test strip [id number] Yes Use as instructed to test blood glucose 3 times daily. Newlin, Enobong, MD  Active Self, Pharmacy Records  Insulin  Pen Needle (DROPLET PEN NEEDLES) 31G X 8 MM MISC [id number]  USE WITH TOUJEO  SOLOSTAR PEN EVERY DAY [name], MD  Active Self, Pharmacy Records  Lancets (  ONETOUCH DELICA PLUS LANCET33G) MISC [id number]  Use as instructed to test blood glucose 3 times daily. Newlin, Enobong, MD  Active Self, Pharmacy Records  Macitentan -Tadalafil  (OPSYNVI) 10-40 MG TABS [id number] Yes Take 1 tablet by mouth in the morning. [provider]  Active   metFORMIN  (GLUCOPHAGE -XR) 500 MG 24 hr tablet [id number] Yes Take 2 tablets (1,000 mg total) by mouth 2 (two) times daily.  Patient taking differently: Take 500 mg by mouth 2 (two) times daily.   Newlin, Enobong, MD  Active Self, Pharmacy Records  Multiple Vitamin (MULTIVITAMIN WITH MINERALS) TABS tablet [id number] Yes Take 1 tablet by mouth daily. [provider]  Active Self, Pharmacy Records  nitroGLYCERIN  (NITROSTAT ) 0.4 MG SL tablet [id number] Yes Place 1 tablet (0.4 mg total) under the tongue every 5 (five) minutes as needed for chest pain. Vonna Sharlet POUR, MD  Active Self, Pharmacy Records           Med Note [facility], ROCKY ONEIDA Repress Dec 11, 2023  4:18 PM) Pt has on hand but never had to use   pantoprazole  (PROTONIX ) 40 MG tablet [id number] Yes TAKE 1 TABLET TWICE DAILY Newlin, Enobong, MD  Active Self, Pharmacy Records  TOUJEO  MAX SOLOSTAR 300 UNIT/ML Solostar Pen [id number] Yes INJECT 50 UNITS INTO THE SKIN DAILY (DOSE DECREASE)  Patient taking differently: Inject 40 Units into the skin daily.   Newlin, Enobong, MD  Active Self, Pharmacy Records  Vitamin D , Cholecalciferol, 25 MCG (1000 UT) TABS [id number] Yes Take 1,000 mcg by mouth daily. [provider]  Active Self, Pharmacy Records            Home Care and Equipment/Supplies: Were Home Health Services Ordered?: No Any new equipment or medical supplies ordered?: Yes Name of Medical supply agency?: [employer] provided rollator and 3[employer] Were you able to get the equipment/medical supplies?: Yes Do you have any questions related to the use of the equipment/supplies?: No  Functional Questionnaire: Do you need assistance with bathing/showering or dressing?: No Do you need assistance with meal preparation?: No Do you need assistance with eating?: No Do you have difficulty maintaining continence: No Do you need assistance with getting out of bed/getting out of a chair/moving?: No Do you have  difficulty managing or taking your medications?: No  Follow up appointments reviewed: PCP Follow-up appointment confirmed?: Yes Date of PCP follow-up appointment?: 01/02/24 Follow-up Provider: Dr. Newlin Specialist [facility] Follow-up appointment confirmed?: Yes Date of Specialist follow-up appointment?: 12/22/23 Follow-Up Specialty Provider:: Heart and Vascular Do you need transportation to your follow-up appointment?: No Do you understand care options if your condition(s) worsen?: Yes-patient verbalized understanding  SDOH Interventions Today    Flowsheet Row Most Recent Value  SDOH Interventions   Food Insecurity Interventions Intervention Not Indicated  Housing Interventions Other (Comment)  [referral to [facility] SW]  Transportation Interventions Intervention Not Indicated  Utilities Interventions Intervention Not Indicated  Depression Interventions/Treatment  --  Lelan to LCSW]    Goals Addressed             This Visit's Progress    [facility] Transitions of Care (TOC) Care Plan       Problems:  Recent Hospitalization for treatment of CHF and anemia Knowledge Deficit Related to Dexcom CGM, SDOH barrier: Housing instability, and managing mental health concerns  Goal:  Over the next 30 days, the patient will not experience hospital readmission  Interventions:  Transitions of Care: Doctor Visits  - discussed the importance of doctor visits Post discharge  activity limitations prescribed by provider reviewed Medication reviewed, discussed patient has questions regarding use of CGM  Reviewed heart healthy, low sodium diet Referral to [facility] pharmacy team for CGM Referral to [facility] BSW for housing insecurity-currently living in a Motel Referral to [facility] LCSW for managing mental health concerns, feelings of depression and anxiety Reviewed upcoming appointments including: [facility] on 12/22/23-patient to inquire if appointment on 01/03/24 is needed, Pulmonology on 12/28/23 and Hospital follow up  with PCP on 01/02/24  Heart Failure Interventions: Provided education on low sodium diet Assessed need for readable accurate scales in home Discussed importance of daily weight and advised patient to weigh and record daily Reviewed role of diuretics in prevention of fluid overload and management of heart failure; Discussed the importance of keeping all appointments with provider Assessed social determinant of health barriers   Patient Self Care Activities:  Attend all scheduled provider appointments Call pharmacy for medication refills 3-7 days in advance of running out of medications Call provider office for new concerns or questions  Notify RN Care Manager of TOC call rescheduling needs Participate in Transition of Care Program/Attend TOC scheduled calls Take medications as prescribed   call office if I gain more than 2 pounds in one day or 5 pounds in one week track weight in diary use salt in moderation watch for swelling in feet, ankles and legs every day weigh myself daily  Plan:  Telephone follow up appointment with care management team member scheduled for:  12/23/23 at 2pm        Andrea Dimes RN, BSN [place]  [facility] RN Care Manager [phone]

## 2023-12-19 ENCOUNTER — Telehealth: Payer: Self-pay | Admitting: Pharmacist

## 2023-12-19 ENCOUNTER — Other Ambulatory Visit: Payer: Self-pay | Admitting: Pharmacist

## 2023-12-19 DIAGNOSIS — E118 Type 2 diabetes mellitus with unspecified complications: Secondary | ICD-10-CM

## 2023-12-19 MED ORDER — DEXCOM G7 RECEIVER DEVI: 0 refills | Status: DC

## 2023-12-19 MED ORDER — DEXCOM G7 SENSOR MISC: 6 refills | Status: DC

## 2023-12-19 NOTE — Telephone Encounter (Signed)
 Can we start a PA for this patient's G7 supplies? Has been approved previously for G6.

## 2023-12-20 ENCOUNTER — Other Ambulatory Visit: Payer: Self-pay

## 2023-12-21 ENCOUNTER — Telehealth (HOSPITAL_COMMUNITY): Payer: Self-pay

## 2023-12-21 NOTE — Progress Notes (Signed)
 ADVANCED HF CLINIC NOTE  PCP: [name], MD Primary Cardiologist: Oneil Parchment, MD HF Cardiologist: Dr. Cherrie  CC: Post Hospital Heart Failure Follow UP   HPI: Deborah Coffey is a 68 y.o. with CAD s/p CABG x1 (LIMA-LAD) 2005, tobacco use, chronic diastolic CHF, anemia, depression, HLD, HTN, DM, CKD stage 3b, prior TIAs, pulmonary HTN (felt due to underlying lung disease/smoking), and morbid obesity.    Echo12/22 EF 65-70% RV moderately dilated with moderate HK. Flattened septum Moderate TR RVSP est 110 mmHG    Chest CT scan 2/22. No PE   R/L cath on 04/16/21: - mLAD 75% LIMA to LAD is widely patent. pRCA 50% - EF 65% - RA 5 PA 79/18 (39) PCW 12  CO 6.4 L/min; CI PVR 4.2  Sleep study 06/2021 only c/w mild OSA AHI 5.5   Echo 11/24 EF 60-65%, IVC flattened in systole and diastole, c/w RV pressure and volume overload.  RV severely enlarged w/ severely reduced systolic function. The estimated RVSP is 92.5 mmHg (comparred to 111 mmHg previous study). RA severely dilated. Moderate TR    PFTs 12/24: FEV1 1.27 (50%) FVC 1.84 (55),ratio 77, DLCOcor 48. Moderately severe restriction with reversibility (12% change) and severe diffusion defect. She is being followed by [facility] Pulmonology (Dr. Brenna). He has also being following her for pulmonary nodules.   Echo 11/24 EF 60-65% RV mod reduced RVSP 92   RHC 3/25 demonstrated severe PAH w/ cor pulmonale, suspect Group 1 and possibly Group 3. She was started on Sildenafil  20 tid.  RA = 4, RV = 77/6, PA =  78/19 (42), PCW = 6, Fick CO/CI = 3.9/1.9, Thermo CO/CI = 3.6/1.7, PVR = 9.3 (Fick) 10.0 (TD), Ao sat = 96%, PA sat = 57%, 58%, PAPi = 14.8]  10/2023 Placed on Opsynvi  Admitted with 11/2023 A/C HFpEF. Echo LVEF 60-65%, septal flattening in systole and diastole, RV severely enlarged and moderately HK, RVSP 133 mmHg, RA severe dilated, severe TR, dilated IVC. Diuresed with IV lasix  and transitioned to lasix  60 mg twice a day. Digoxin   added. At discharge Opsynvi restarted.   Today she returns for post hospital follow up..Overall feeling fine. SOB with exertion. No change. Denies PND/Orthopnea. Appetite ok. Eating take out. Had [place] food. No fever or chills.She does not weght at home. Taking all medications. Rarely smoking.   Cardiac Studies: - Echo 11/24 EF 60-65%, IVC flattened in systole and diastole, c/w RV pressure and volume overload,  RV severely enlarged w/ severely reduced systolic function.  - PFTs 2/23 FEV1 1.27 (61%) FVC 1.63 (61%) DLCO 43%  - R/LHC (12/22): EF 65%, mLAD 75% LIMA to LAD is widely patent. pRCA 50; RA 5 PA 79/18 (39) PCW 12  CO 6.4 L/min; CI PVR 4.2  - Echo (12/22): EF 65-70% RV moderately dilated with moderate HK. Flattened septum Moderate TR RVSP est 110mmHG    - Chest CT scan 2/22. No PE  Past Medical History:  Diagnosis Date   Allergy    Anemia    CAD in native artery 02/18/2004   Mid LAD lesoin just after Large D1 --> CABG x 1 LIMA-LAD   CAP (community acquired pneumonia) 08/07/2014   CHF (congestive heart failure) (HCC)    Chronic kidney disease, stage 3b (HCC)    Depression    High cholesterol    History of blood transfusion 04/20/2003   related to OHS    Hypertension    Lung nodules    Mild carotid artery  disease (HCC)    Pulmonary hypertension (HCC)    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 competitive flow   TIA (transient ischemic attack)    Type II diabetes mellitus (HCC)    Current Outpatient Medications  Medication Sig Dispense Refill   acetaminophen  (TYLENOL ) 500 MG tablet Take 1,000 mg by mouth every 6 (six) hours as needed for headache or moderate pain (pain score 4-6).     Alcohol Swabs (B-D SINGLE USE SWABS REGULAR) PADS USE AS DIRECTED 100 each 0   aspirin  EC 81 MG tablet Take 81 mg by mouth daily.     atorvastatin  (LIPITOR) 10 MG tablet TAKE 1 TABLET ONE TIME DAILY 90 tablet 1   Blood Glucose  Monitoring Suppl (ONETOUCH VERIO) w/Device KIT Use as instructed to test blood glucose 3 times daily. 1 kit 0   Continuous Glucose Receiver (DEXCOM G7 RECEIVER) DEVI Use to check blood glucose continuously. 1 each 0   Continuous Glucose Sensor (DEXCOM G7 SENSOR) MISC Use to check blood glucose throughout the day. Changes sensors once every 10 days. 3 each 6   cyanocobalamin  (VITAMIN B12) 1000 MCG tablet Take 1,000 mcg by mouth daily.     dapagliflozin  propanediol (FARXIGA ) 10 MG TABS tablet TAKE 1 TABLET EVERY DAY BEFORE BREAKFAST 90 tablet 1   diclofenac  Sodium (VOLTAREN ) 1 % GEL Apply 2 g topically daily as needed (muscle pain). 150 g 1   digoxin  (LANOXIN ) 0.125 MG tablet Take 1 tablet (0.125 mg total) by mouth daily. 30 tablet 0   furosemide  (LASIX ) 40 MG tablet Take 1.5 tablets (60 mg total) by mouth 2 (two) times daily. 90 tablet 1   gabapentin  (NEURONTIN ) 300 MG capsule Take 300 mg by mouth 2 (two) times daily.     glucose blood (ONETOUCH VERIO) test strip Use as instructed to test blood glucose 3 times daily. 100 each 3   Insulin  Pen Needle (DROPLET PEN NEEDLES) 31G X 8 MM MISC USE WITH TOUJEO  SOLOSTAR PEN EVERY DAY 100 each 0   Lancets (ONETOUCH DELICA PLUS LANCET33G) MISC Use as instructed to test blood glucose 3 times daily. 100 each 3   Macitentan -Tadalafil  (OPSYNVI) 10-40 MG TABS Take 1 tablet by mouth in the morning.     metFORMIN  (GLUCOPHAGE -XR) 500 MG 24 hr tablet Take 2 tablets (1,000 mg total) by mouth 2 (two) times daily. (Patient taking differently: Take 500 mg by mouth 2 (two) times daily.) 360 tablet 1   Multiple Vitamin (MULTIVITAMIN WITH MINERALS) TABS tablet Take 1 tablet by mouth daily.     nitroGLYCERIN  (NITROSTAT ) 0.4 MG SL tablet Place 1 tablet (0.4 mg total) under the tongue every 5 (five) minutes as needed for chest pain. 25 tablet 0   pantoprazole  (PROTONIX ) 40 MG tablet TAKE 1 TABLET TWICE DAILY 180 tablet 1   TOUJEO  MAX SOLOSTAR 300 UNIT/ML Solostar Pen INJECT 50  UNITS INTO THE SKIN DAILY (DOSE DECREASE) (Patient taking differently: Inject 40 Units into the skin daily.) 18 mL 3   Vitamin D , Cholecalciferol, 25 MCG (1000 UT) TABS Take 1,000 mcg by mouth daily.     No current facility-administered medications for this encounter.   Allergies  Allergen Reactions   Ciprofloxacin Rash   Social History   Socioeconomic History   Marital status: Divorced    Spouse name: Not on file   Number of children: Not on file   Years of education: 12   Highest education level:  Some college, no degree  Occupational History   Occupation: Disabled  Tobacco Use   Smoking status: Every Day    Current packs/day: 0.25    Average packs/day: 0.3 packs/day for 42.0 years (10.5 ttl pk-yrs)    Types: Cigarettes   Smokeless tobacco: Current   Tobacco comments:    10 Cigarettes a day. 08/22/2023  Vaping Use   Vaping status: Some Days   Substances: Flavoring  Substance and Sexual Activity   Alcohol use: Not Currently   Drug use: No    Types: Marijuana    Comment: smoked pot years ago but not currently   Sexual activity: Not Currently  Other Topics Concern   Not on file  Social History Narrative   Lives with daughter and 3 grandchildren   Social Drivers of Health   Financial Resource Strain: Low Risk  (08/23/2023)   Overall Financial Resource Strain (CARDIA)    Difficulty of Paying Living Expenses: Not hard at all  Food Insecurity: No Food Insecurity (12/16/2023)   Hunger Vital Sign    Worried About Running Out of Food in the Last Year: Never true    Ran Out of Food in the Last Year: Never true  Transportation Needs: No Transportation Needs (12/16/2023)   PRAPARE - Administrator, Civil Service (Medical): No    Lack of Transportation (Non-Medical): No  Physical Activity: Inactive (08/23/2023)   Exercise Vital Sign    Days of Exercise per Week: 0 days    Minutes of Exercise per Session: 0 min  Stress: No Stress Concern Present (08/23/2023)   [employer] of Occupational Health - Occupational Stress Questionnaire    Feeling of Stress : Not at all  Social Connections: Unknown (12/12/2023)   Social Connection and Isolation Panel    Frequency of Communication with Friends and Family: More than three times a week    Frequency of Social Gatherings with Friends and Family: More than three times a week    Attends Religious Services: Not on file    Active Member of Clubs or Organizations: Not on file    Attends Banker Meetings: Not on file    Marital Status: Not on file  Intimate Partner Violence: Not At Risk (12/16/2023)   Humiliation, Afraid, Rape, and Kick questionnaire    Fear of Current or Ex-Partner: No    Emotionally Abused: No    Physically Abused: No    Sexually Abused: No   Family History  Problem Relation Age of Onset   Heart disease Mother    Diabetes Mother    Cancer Mother        cervical   Diabetes Father    Diabetes Sister    Heart disease Sister    Lupus Sister    Heart failure Sister    Hepatitis C Sister    Hypertension Daughter    Diabetes Brother    Heart attack Brother    Heart failure Brother    Diabetes Brother    Colon cancer Neg Hx    Breast cancer Neg Hx    BP (!) 144/68   Pulse 93   Ht 5' 5 (1.651 m)   Wt 103.1 kg (227 lb 6.4 oz)   LMP  (LMP Unknown)   SpO2 93%   BMI 37.84 kg/m   Wt Readings from Last 3 Encounters:  12/22/23 103.1 kg (227 lb 6.4 oz)  12/16/23 99.8 kg (220 lb)  12/12/23 103.4 kg (227 lb 15.3 oz)  PHYSICAL EXAM: General:   No resp difficulty Neck: JVP 9-10  Cor: Regular rate & rhythm.  Lungs: clear Abdomen: soft, nontender, nondistended.  Extremities: R and LLE 1+  no  edema Neuro: alert & oriented x3   ASSESSMENT & PLAN:  1. Pulmonary HTN with RV failure/cor pulmonale - Echo (12/22): EF 65-70% RV moderately dilated with moderate HK, flattened septum, moderate TR, RVSP est 110 mmHG   - Chest CT scan (2/22): No PE - RHC (12/22): RA 5 PA 79/18  (39) PCW 12  CO 6.4 L/min; CI PVR 4.2 - PFTs with significant restrictive/obstructive lung disease - ANA, ANCA, [place]-70, RF all negative. - Sleep study with mild OSA.  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 She was started on Sildenafil  20 tid. She was later transitioned to Opsynvi -Echo 11/2023- LVEF 60-65%, septal flattening in systole and diastole, RV severely enlarged and moderately HK, RVSP 133 mmHg, RA severe dilated, severe TR, dilated IVC -NYHA III. Volume status trending up. Suspect in the setting of high sodium diet. Increase lasix  80 mg twice a day. Discussed low salt food choices. Provided with scale and weight chart.  - Continue Opsynvi  - Continue Farxiga  10 mg daily. - Previous sleep study with just mild OSE. Place ONOX -Check BMET   2. CAD - CABG 2005 LIMA-> LAD - Cath (04/16/21): mLAD 75% LIMA to LAD is widely patent. pRCA 50 - No chest pain. - Continue ASA/statin.  3. Morbid obesity - Body mass index is 37.84 kg/m.. - On ozempic  - Discussed portion control.   4. OSA - Mild on sleep study. Not on CPAP - Has follow up with Dr Annella.   5. HTN Stable  6. Smoking - Discussed cessation   Follow up in 2 weeks with Dr Cherrie.    Greig Mosses, NP  10:05 AM

## 2023-12-21 NOTE — Telephone Encounter (Signed)
 Called to confirm/remind patient of their appointment at the [facility] on 12/22/23 9:30.   Appointment:   [x] Confirmed  [] Left mess   [] No answer/No voice mail  [] VM Full/unable to leave message  [] Phone not in service  Patient reminded to bring all medications and/or complete list.  Confirmed patient has transportation. Gave directions, instructed to utilize valet parking.

## 2023-12-22 ENCOUNTER — Ambulatory Visit (HOSPITAL_COMMUNITY)
Admit: 2023-12-22 | Discharge: 2023-12-22 | Disposition: A | Discharge status: Home / Self Care | Source: Ambulatory Visit | Attending: Adult Health | Admitting: Adult Health

## 2023-12-22 ENCOUNTER — Encounter (HOSPITAL_COMMUNITY): Payer: Self-pay

## 2023-12-22 ENCOUNTER — Ambulatory Visit (HOSPITAL_COMMUNITY): Payer: Self-pay | Admitting: Adult Health

## 2023-12-22 VITALS — BP 144/68 | HR 93 | Ht 65.0 in | Wt 227.4 lb

## 2023-12-22 DIAGNOSIS — Z7985 Long-term (current) use of injectable non-insulin antidiabetic drugs: Secondary | ICD-10-CM | POA: Diagnosis not present

## 2023-12-22 DIAGNOSIS — E1122 Type 2 diabetes mellitus with diabetic chronic kidney disease: Secondary | ICD-10-CM | POA: Insufficient documentation

## 2023-12-22 DIAGNOSIS — F329 Major depressive disorder, single episode, unspecified: Secondary | ICD-10-CM | POA: Diagnosis not present

## 2023-12-22 DIAGNOSIS — I130 Hypertensive heart and chronic kidney disease with heart failure and stage 1 through stage 4 chronic kidney disease, or unspecified chronic kidney disease: Secondary | ICD-10-CM | POA: Diagnosis not present

## 2023-12-22 DIAGNOSIS — Z7982 Long term (current) use of aspirin: Secondary | ICD-10-CM | POA: Insufficient documentation

## 2023-12-22 DIAGNOSIS — Z79899 Other long term (current) drug therapy: Secondary | ICD-10-CM | POA: Diagnosis not present

## 2023-12-22 DIAGNOSIS — I071 Rheumatic tricuspid insufficiency: Secondary | ICD-10-CM | POA: Diagnosis not present

## 2023-12-22 DIAGNOSIS — N1832 Chronic kidney disease, stage 3b: Secondary | ICD-10-CM | POA: Insufficient documentation

## 2023-12-22 DIAGNOSIS — Z7984 Long term (current) use of oral hypoglycemic drugs: Secondary | ICD-10-CM | POA: Diagnosis not present

## 2023-12-22 DIAGNOSIS — E669 Obesity, unspecified: Secondary | ICD-10-CM | POA: Diagnosis not present

## 2023-12-22 DIAGNOSIS — I5032 Chronic diastolic (congestive) heart failure: Secondary | ICD-10-CM | POA: Diagnosis not present

## 2023-12-22 DIAGNOSIS — I878 Other specified disorders of veins: Secondary | ICD-10-CM | POA: Diagnosis not present

## 2023-12-22 DIAGNOSIS — E7800 Pure hypercholesterolemia, unspecified: Secondary | ICD-10-CM | POA: Diagnosis not present

## 2023-12-22 DIAGNOSIS — I2781 Cor pulmonale (chronic): Secondary | ICD-10-CM | POA: Diagnosis not present

## 2023-12-22 DIAGNOSIS — Z794 Long term (current) use of insulin: Secondary | ICD-10-CM | POA: Insufficient documentation

## 2023-12-22 DIAGNOSIS — I272 Pulmonary hypertension, unspecified: Secondary | ICD-10-CM | POA: Insufficient documentation

## 2023-12-22 DIAGNOSIS — J449 Chronic obstructive pulmonary disease, unspecified: Secondary | ICD-10-CM | POA: Diagnosis not present

## 2023-12-22 DIAGNOSIS — D631 Anemia in chronic kidney disease: Secondary | ICD-10-CM | POA: Insufficient documentation

## 2023-12-22 DIAGNOSIS — Z716 Tobacco abuse counseling: Secondary | ICD-10-CM | POA: Diagnosis not present

## 2023-12-22 DIAGNOSIS — I2510 Atherosclerotic heart disease of native coronary artery without angina pectoris: Secondary | ICD-10-CM | POA: Insufficient documentation

## 2023-12-22 DIAGNOSIS — F17210 Nicotine dependence, cigarettes, uncomplicated: Secondary | ICD-10-CM | POA: Insufficient documentation

## 2023-12-22 DIAGNOSIS — F17290 Nicotine dependence, other tobacco product, uncomplicated: Secondary | ICD-10-CM | POA: Insufficient documentation

## 2023-12-22 DIAGNOSIS — Z6837 Body mass index (BMI) 37.0-37.9, adult: Secondary | ICD-10-CM | POA: Diagnosis not present

## 2023-12-22 DIAGNOSIS — G4733 Obstructive sleep apnea (adult) (pediatric): Secondary | ICD-10-CM | POA: Diagnosis not present

## 2023-12-22 DIAGNOSIS — Z8673 Personal history of transient ischemic attack (TIA), and cerebral infarction without residual deficits: Secondary | ICD-10-CM | POA: Insufficient documentation

## 2023-12-22 DIAGNOSIS — Z951 Presence of aortocoronary bypass graft: Secondary | ICD-10-CM | POA: Insufficient documentation

## 2023-12-22 LAB — BASIC METABOLIC PANEL WITH GFR
Anion gap: 9 (ref 5–15)
BUN: 34 mg/dL — ABNORMAL HIGH (ref 8–23)
CO2: 26 mmol/L (ref 22–32)
Calcium: 9.6 mg/dL (ref 8.9–10.3)
Chloride: 107 mmol/L (ref 98–111)
Creatinine, Ser: 1.7 mg/dL — ABNORMAL HIGH (ref 0.44–1.00)
GFR, Estimated: 33 mL/min — ABNORMAL LOW (ref >60)
Glucose, Bld: 82 mg/dL (ref 70–99)
Potassium: 4.5 mmol/L (ref 3.5–5.1)
Sodium: 142 mmol/L (ref 135–145)

## 2023-12-22 LAB — BRAIN NATRIURETIC PEPTIDE: B Natriuretic Peptide: 140.3 pg/mL — ABNORMAL HIGH (ref 0.0–100.0)

## 2023-12-22 MED ORDER — FUROSEMIDE 40 MG PO TABS: 80.0000 mg | ORAL_TABLET | Freq: Two times a day (BID) | ORAL | 1 refills | Status: DC

## 2023-12-22 NOTE — Patient Instructions (Addendum)
 INCREASE Lasix  to 80 mg Twice daily  Labs done today, your results will be available in MyChart, we will contact you for abnormal readings.  Do the following things EVERYDAY: Weigh yourself in the morning before breakfast. Write it down and keep it in a log. Take your medicines as prescribed Eat low salt foods--Limit salt (sodium) to 2000 mg per day.  Stay as active as you can everyday Limit all fluids for the day to less than 2 liters  Keep follow up as scheduled.  If you have any questions or concerns before your next appointment please send us  a message through [facility] or call our office at [phone].    TO LEAVE A MESSAGE FOR THE NURSE SELECT OPTION 2, PLEASE LEAVE A MESSAGE INCLUDING: YOUR NAME DATE OF BIRTH CALL BACK NUMBER REASON FOR CALL**this is important as we prioritize the call backs  YOU WILL RECEIVE A CALL BACK THE SAME DAY AS LONG AS YOU CALL BEFORE 4:00 PM  At the [facility], you and your health needs are our priority. As part of our continuing mission to provide you with exceptional heart care, we have created designated Provider Care Teams. These Care Teams include your primary Cardiologist (physician) and Advanced Practice Providers (APPs- Physician Assistants and Nurse Practitioners) who all work together to provide you with the care you need, when you need it.   You may see any of the following providers on your designated Care Team at your next follow up: Dr Toribio Fuel Dr Ezra Shuck Dr. Ria Commander Dr. Morene Brownie Amy Lenetta, NP Caffie Shed, [place] [facility] [place], [place] Beckey Coe, NP [place] Lee, NP Ellouise Class, NP Tinnie Redman, PharmD Jaun Bash, PharmD   Please be sure to bring in all your medications bottles to every appointment.    Thank you for choosing [facility]

## 2023-12-23 ENCOUNTER — Telehealth: Payer: Self-pay | Admitting: *Deleted

## 2023-12-23 ENCOUNTER — Encounter: Payer: Self-pay | Admitting: *Deleted

## 2023-12-26 ENCOUNTER — Telehealth: Payer: Self-pay | Admitting: *Deleted

## 2023-12-26 NOTE — Patient Instructions (Signed)
 Visit Information  Thank you for taking time to visit with me today. Please don't hesitate to contact me if I can be of assistance to you before our next scheduled telephone appointment.   Following is a copy of your care plan:   Goals Addressed             This Visit's Progress    [facility] Transitions of Care (TOC) Care Plan       Problems:  Recent Hospitalization for treatment of CHF and anemia Knowledge Deficit Related to Dexcom CGM, SDOH barrier: Housing instability, and managing mental health concerns  Goal:  Over the next 30 days, the patient will not experience hospital readmission  Interventions:  Transitions of Care: Doctor Visits  - discussed the importance of doctor visits Post discharge activity limitations prescribed by provider reviewed Medication reviewed, discussed Dexcom sensor and reader has been ordered to mail order Pharmacy  Reviewed heart healthy, low sodium diet Referral to [facility] pharmacy team for CGM-Dexcom ordered, has not been delivered Referral to [facility] for housing insecurity-has not been scheduled, will be moving to new housing in 2 weeks Referral to [facility] LCSW for managing mental health concerns, feelings of depression and anxiety-has not been scheduled Reviewed upcoming appointments including: [facility] on 01/03/24 with Dr. Cherrie, Pulmonology on 12/28/23 and Hospital follow up with PCP on 01/02/24  Heart Failure Interventions: Discussed importance of daily weight and advised patient to weigh and record daily Discussed the importance of keeping all appointments with provider Provided patient with education about the role of exercise in the management of heart failure  Patient Self Care Activities:  Attend all scheduled provider appointments Call pharmacy for medication refills 3-7 days in advance of running out of medications Call provider office for new concerns or questions  Notify RN Care Manager of TOC call rescheduling needs Participate in  Transition of Care Program/Attend TOC scheduled calls Take medications as prescribed   call office if I gain more than 2 pounds in one day or 5 pounds in one week track weight in diary use salt in moderation watch for swelling in feet, ankles and legs every day weigh myself daily  Plan:  Telephone follow up appointment with care management team member scheduled for:  01/03/24 at 12:45pm        Patient verbalizes understanding of instructions and care plan provided today and agrees to view in MyChart. Active MyChart status and patient understanding of how to access instructions and care plan via MyChart confirmed with patient.     Telephone follow up appointment with care management team member scheduled for:01/03/24 at 12:45pm  Please call the care guide team at [phone] if you need to cancel or reschedule your appointment.   Please call [phone]-TALK (toll free, 24 hour hotline) go to [facility] [address], [place] [phone]) call 911 if you are experiencing a Mental Health or Behavioral Health Crisis or need someone to talk to.  Andrea Dimes RN, BSN [place]  [facility] RN Care Manager [phone]

## 2023-12-26 NOTE — Transitions of Care (Post Inpatient/ED Visit) (Signed)
 Transition of Care week 2  Visit Note  12/26/2023  Name: Deborah Coffey MRN: [id number]          DOB: Mar 22, 1956  Situation: Patient enrolled in [facility] 30-day program. Visit completed with Deborah Coffey by telephone.   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 competitive flow   TIA (transient ischemic attack)    Type II diabetes mellitus (HCC)     Assessment: Patient Reported Symptoms: Cognitive Cognitive Status: Able to follow simple commands, Alert and oriented to person, place, and time, Normal speech and language skills      Neurological Neurological Review of Symptoms: No symptoms reported    HEENT HEENT Symptoms Reported: No symptoms reported      Cardiovascular Cardiovascular Symptoms Reported: No symptoms reported Cardiovascular Management Strategies: Routine screening, Coping strategies, Adequate rest, Medication therapy Weight: 225 lb (102.1 kg) Cardiovascular Self-Management Outcome: 3 (uncertain) Cardiovascular Comment: Patient concerned she is retaining fluid due to housing situation and unable to prepare her own meals. She is eating out a lot. Housing has been secured and will be available in 2 weeks. Patient has a BP monitor in storage. She has not been checking BP regularly, but will once she can get her monitor out of storage.  Respiratory Respiratory  Symptoms Reported: No symptoms reported Respiratory Self-Management Outcome: 4 (good)  Endocrine Endocrine Symptoms Reported: No symptoms reported Endocrine Comment: Patient has not been contacted by Pharmacy. Follow up with PCP on 01/02/24. Advised to take Dexcom to upcoming PCP appointment on 01/02/24.  Gastrointestinal Gastrointestinal Symptoms Reported: Constipation Additional Gastrointestinal Details: LBM 12/26/23 Gastrointestinal Management Strategies: Medication therapy, Adequate rest Gastrointestinal Self-Management Outcome: 4 (good)    Genitourinary Genitourinary Symptoms Reported: No symptoms reported    Integumentary Integumentary Symptoms Reported: No symptoms reported    Musculoskeletal Musculoskelatal Symptoms Reviewed: Limited mobility Additional Musculoskeletal Details: ambulating with rollator at times Musculoskeletal Management Strategies: Routine screening Musculoskeletal Self-Management Outcome: 4 (good)      Psychosocial Psychosocial Symptoms Reported: Not assessed         There were no vitals filed for this visit.  Medications Reviewed Today     Reviewed by Lucky Andrea LABOR, RN (Registered Nurse) on 12/26/23 at 1434  Med List Status: <None>   Medication Order Taking? Sig Documenting Provider Last Dose Status Informant  acetaminophen  (TYLENOL ) 500 MG tablet [id number] Yes Take 1,000 mg by mouth every 6 (six) hours as needed for headache or moderate pain (pain score 4-6). [provider]  Active Self, Pharmacy Records  Alcohol Swabs (B-D SINGLE USE SWABS REGULAR) PADS [id number]  USE AS DIRECTED Jegede, Olugbemiga E, MD  Active Self, Pharmacy Records  aspirin  EC 81 MG tablet [id number] Yes Take 81 mg by mouth daily. [provider]  Active  Self, Pharmacy Records  atorvastatin  (LIPITOR) 10 MG tablet [id number] Yes TAKE 1 TABLET ONE TIME DAILY Newlin, Enobong, MD  Active Self, Pharmacy Records  Blood Glucose Monitoring Suppl [facility] VERIO) w/Device KIT  [id number]  Use as instructed to test blood glucose 3 times daily.  Patient not taking: Reported on 12/26/2023   Newlin, Enobong, MD  Active Self, Pharmacy Records  Continuous Glucose Receiver [facility] G7 RECEIVER) [place] [id number]  Use to check blood glucose continuously. Newlin, Enobong, MD  Active   Continuous Glucose Sensor (DEXCOM G7 SENSOR) MISC [id number]  Use to check blood glucose throughout the day. Changes sensors once every 10 days. Newlin, Enobong, MD  Active   cyanocobalamin  (VITAMIN B12) 1000 MCG tablet [id number]  Take 1,000 mcg by mouth daily.  Patient not taking: Reported on 12/26/2023   [provider]  Active Self, Pharmacy Records  dapagliflozin  propanediol (FARXIGA ) 10 MG TABS tablet [id number] Yes TAKE 1 TABLET EVERY DAY BEFORE BREAKFAST Newlin, Enobong, MD  Active Self, Pharmacy Records  diclofenac  Sodium (VOLTAREN ) 1 % GEL [id number]  Apply 2 g topically daily as needed (muscle pain).  Patient not taking: Reported on 12/26/2023   Newlin, Enobong, MD  Active Self, Pharmacy Records           Med Note [facility], ROCKY ONEIDA Repress Dec 11, 2023  4:22 PM) Pt ran out of this gel prior to admission; however, plans to purchase more   digoxin  (LANOXIN ) 0.125 MG tablet [id number] Yes Take 1 tablet (0.125 mg total) by mouth daily. Lenon Marien CROME, MD  Active   furosemide  (LASIX ) 40 MG tablet [id number] Yes Take 2 tablets (80 mg total) by mouth 2 (two) times daily. Lenetta No D, NP  Active   gabapentin  (NEURONTIN ) 300 MG capsule [id number] Yes Take 300 mg by mouth 2 (two) times daily. [provider]  Active Self, Pharmacy Records  glucose blood (ONETOUCH VERIO) test strip [id number]  Use as instructed to test blood glucose 3 times daily.  Patient not taking: Reported on 12/26/2023   Newlin, Enobong, MD  Active Self, Pharmacy Records  Insulin  Pen Needle (DROPLET PEN NEEDLES) 31G X 8 MM MISC [id number] Yes USE WITH TOUJEO  SOLOSTAR PEN EVERY DAY Newlin, Enobong, MD  Active Self, Pharmacy  Records  Lancets [facility] DELICA PLUS [place]) [place] [id number]  Use as instructed to test blood glucose 3 times daily.  Patient not taking: Reported on 12/26/2023   Newlin, Enobong, MD  Active Self, Pharmacy Records  Macitentan -Tadalafil  (OPSYNVI) 10-40 MG TABS [id number] Yes Take 1 tablet by mouth in the morning. [provider]  Active   metFORMIN  (GLUCOPHAGE -XR) 500 MG 24 hr tablet [id number] Yes Take 2 tablets (1,000 mg total) by mouth 2 (two) times daily.  Patient taking differently: Take 500 mg by mouth 2 (two) times daily.   Newlin, Enobong, MD  Active Self, Pharmacy Records  Multiple Vitamin (MULTIVITAMIN WITH MINERALS) TABS tablet [id number] Yes Take 1 tablet by mouth daily. [provider]  Active Self, Pharmacy Records  nitroGLYCERIN  (NITROSTAT ) 0.4 MG SL tablet [id number] Yes Place 1 tablet (0.4 mg total) under the tongue every 5 (five) minutes as needed for chest pain. Vonna Sharlet POUR, MD  Active Self, Pharmacy Records           Med Note [facility], ROCKY ONEIDA Repress Dec 11, 2023  4:18 PM) Pt has on hand but never had to use   pantoprazole  (PROTONIX ) 40 MG tablet [id number] Yes TAKE 1 TABLET TWICE DAILY Newlin,  Corrina, MD  Active Self, Pharmacy Records  TOUJEO  MAX SOLOSTAR 300 UNIT/ML Solostar Pen [id number] Yes INJECT 50 UNITS INTO THE SKIN DAILY (DOSE DECREASE) Newlin, Enobong, MD  Active Self, Pharmacy Records  Vitamin D , Cholecalciferol, 25 MCG (1000 UT) TABS [id number] Yes Take 1,000 mcg by mouth daily. [provider]  Active Self, Pharmacy Records            Recommendation:   Continue Current Plan of Care  Follow Up Plan:   Telephone follow-up in 1 week  Andrea Dimes RN, BSN [place]  [facility] RN Care Manager [phone]

## 2023-12-28 ENCOUNTER — Encounter: Payer: Self-pay | Admitting: Pulmonary Disease

## 2023-12-28 ENCOUNTER — Ambulatory Visit: Admitting: Pulmonary Disease

## 2023-12-28 ENCOUNTER — Other Ambulatory Visit: Payer: Self-pay

## 2023-12-28 VITALS — BP 117/84 | HR 100 | Ht 64.0 in | Wt 223.0 lb

## 2023-12-28 DIAGNOSIS — F17210 Nicotine dependence, cigarettes, uncomplicated: Secondary | ICD-10-CM | POA: Diagnosis not present

## 2023-12-28 DIAGNOSIS — I272 Pulmonary hypertension, unspecified: Secondary | ICD-10-CM

## 2023-12-28 DIAGNOSIS — Z122 Encounter for screening for malignant neoplasm of respiratory organs: Secondary | ICD-10-CM

## 2023-12-28 DIAGNOSIS — J984 Other disorders of lung: Secondary | ICD-10-CM

## 2023-12-28 NOTE — Patient Instructions (Signed)
 Nice to meet you  No change in the medication  Order repeat CT scan for lung cancer screening, your most recent one last year looked fine  We can talk about sleep apnea and a sleep test at next visit  Return to clinic in 3 months or sooner as needed with Dr. Annella

## 2023-12-28 NOTE — Progress Notes (Signed)
 @Patient  ID: Deborah Coffey, female    DOB: [birth year], 68 y.o.   MRN: [id number]  Chief Complaint  Patient presents with   Medical Management of Chronic Issues    Pt states  she just got out of hospital fluid on lungs      Referring provider: [name], MD  HPI:   68 y.o. woman history of pulmonary hypertension whom we are seeing for follow-up of lung nodule and pulmonary hypertension.  Multiple prior pulmonary reviewed.  Multiple prior cardiology notes reviewed.  Patient diagnosed with pulmonary hypertension.  Wedge normal, PVR approximately 10.  Cardiac index depressed 1.9 06/2023.  Placed on sildenafil  initially.  Recently transition to combination tadalafil /macitentan .  Tolerating well.  Recently admitted to the hospital.  With fluid overload.  Was diuresed.  Weight was up.  Got better.  Back to baseline breathing.  Currently at baseline breathing.  Dry weight reportedly around 220.  Lasix  recently increased last week at cardiology clinic with weight of 227.  Weight today 223.  She has no complaints.  She had a CT scan last year about 1 year ago that send resolution of prior nodule.  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  Due for 1 year follow-up.  Not yet scheduled.  We discussed this at length.  Questionaires / Pulmonary Flowsheets:   ACT:      No data to display          MMRC:     No data to display          Epworth:      No data to display          Tests:   FENO:  No results found for: NITRICOXIDE  PFT:    Latest Ref Rng & Units 03/21/2023    3:59 PM 05/28/2021    9:53 AM  PFT Results  FVC-Pre L 1.84  1.63   FVC-Predicted Pre % 55  61   FVC-Post L 1.86  1.71   FVC-Predicted Post % 56  64   Pre FEV1/FVC % % 69  78   Post FEV1/FCV % % 77  81   FEV1-Pre L 1.27  1.27   FEV1-Predicted Pre % 50  61   FEV1-Post L 1.43  1.39   DLCO uncorrected ml/min/mmHg 9.80  9.06   DLCO UNC% % 47  43   DLCO corrected ml/min/mmHg 9.95    DLCO COR  %Predicted % 48    DLVA Predicted % 90  78   TLC L 3.68  3.33   TLC % Predicted % 70  63   RV % Predicted % 87  77   Personally reviewed interpreted as spirometry suggestive of air trapping versus restriction.  Lung volumes mildly reduced.  DLCO severely reduced.  WALK:     03/23/2023   11:53 AM 09/04/2021    2:43 PM  SIX MIN WALK  2 Minute Oxygen Saturation %  97 %  2 Minute HR  95  4 Minute Oxygen Saturation %  95 %  4 Minute HR  97  6 Minute Oxygen Saturation %  98 %  6 Minute HR  105  Tech Comments: Pt could only walk one lap due to elevated HR. IG, RMA     Imaging: \Personally reviewed and as per EMR in discussion this note ECHOCARDIOGRAM COMPLETE Result Date: 12/12/2023    ECHOCARDIOGRAM REPORT   Patient Name:   Deborah Coffey Date of Exam: 12/12/2023 Medical Rec #:  [id number]  Height:       65.0 in Accession #:    [id number]         Weight:       228.0 lb Date of Birth:  12-30-1955         BSA:          2.091 m Patient Age:    68 years           BP:           113/64 mmHg Patient Gender: F                  HR:           88 bpm. Exam Location:  Inpatient Procedure: 2D Echo, Color Doppler and Cardiac Doppler (Both Spectral and Color            Flow Doppler were utilized during procedure). Indications:    CHF- Acute Systolic I50.21  History:        Patient has prior history of Echocardiogram examinations, most                 recent 02/25/2023. CAD, Prior CABG; Risk Factors:Hypertension,                 Dyslipidemia, Diabetes and Current Smoker.  Sonographer:    Koleen Popper RDCS Referring Phys: [id number] RAVI PAHWANI  Sonographer Comments: Patient is obese. IMPRESSIONS  1. Left ventricular ejection fraction, by estimation, is 60 to 65%. The left ventricle has normal function. The left ventricle has no regional wall motion abnormalities. Left ventricular diastolic parameters were normal. There is the interventricular septum is flattened in systole and diastole, consistent with  right ventricular pressure and volume overload.  2. Right ventricular systolic function is moderately reduced. The right ventricular size is severely enlarged. There is severely elevated pulmonary artery systolic pressure. The estimated right ventricular systolic pressure is 133.8 mmHg.  3. Right atrial size was severely dilated.  4. The mitral valve is normal in structure. No evidence of mitral valve regurgitation. No evidence of mitral stenosis.  5. Tricuspid valve regurgitation is severe.  6. The aortic valve is tricuspid. Aortic valve regurgitation is not visualized. Aortic valve sclerosis/calcification is present, without any evidence of aortic stenosis.  7. Pulmonic valve regurgitation is moderate.  8. The inferior vena cava is dilated in size with <50% respiratory variability, suggesting right atrial pressure of 15 mmHg. FINDINGS  Left Ventricle: Left ventricular ejection fraction, by estimation, is 60 to 65%. The left ventricle has normal function. The left ventricle has no regional wall motion abnormalities. The left ventricular internal cavity size was normal in size. There is  no left ventricular hypertrophy. The interventricular septum is flattened in systole and diastole, consistent with right ventricular pressure and volume overload. Left ventricular diastolic parameters were normal. Normal left ventricular filling pressure. Right Ventricle: The right ventricular size is severely enlarged. No increase in right ventricular wall thickness. Right ventricular systolic function is moderately reduced. There is severely elevated pulmonary artery systolic pressure. The tricuspid regurgitant velocity is 5.45 m/s, and with an assumed right atrial pressure of 15 mmHg, the estimated right ventricular systolic pressure is 133.8 mmHg. Left Atrium: Left atrial size was normal in size. Right Atrium: Right atrial size was severely dilated. Pericardium: There is no evidence of pericardial effusion. Mitral Valve: The  mitral valve is normal in structure. Mild to moderate mitral annular calcification. No evidence of mitral valve regurgitation. No evidence of mitral valve stenosis. Tricuspid  Valve: The tricuspid valve is normal in structure. Tricuspid valve regurgitation is severe. No evidence of tricuspid stenosis. Aortic Valve: The aortic valve is tricuspid. Aortic valve regurgitation is not visualized. Aortic valve sclerosis/calcification is present, without any evidence of aortic stenosis. Pulmonic Valve: The pulmonic valve was normal in structure. Pulmonic valve regurgitation is moderate. No evidence of pulmonic stenosis. Aorta: The aortic root is normal in size and structure. Venous: The inferior vena cava is dilated in size with less than 50% respiratory variability, suggesting right atrial pressure of 15 mmHg. IAS/Shunts: No atrial level shunt detected by color flow Doppler.  LEFT VENTRICLE PLAX 2D LVIDd:         4.10 cm   Diastology LVIDs:         3.20 cm   LV e' medial:    7.72 cm/s LV PW:         1.10 cm   LV E/e' medial:  9.3 LV IVS:        1.00 cm   LV e' lateral:   13.70 cm/s LVOT diam:     1.80 cm   LV E/e' lateral: 5.2 LV SV:         49 LV SV Index:   23 LVOT Area:     2.54 cm  RIGHT VENTRICLE            IVC RV S prime:     5.72 cm/s  IVC diam: 2.30 cm TAPSE (M-mode): 1.4 cm LEFT ATRIUM           Index        RIGHT ATRIUM           Index LA diam:      4.20 cm 2.01 cm/m   RA Area:     23.30 cm LA Vol (A2C): 34.9 ml 16.69 ml/m  RA Volume:   76.20 ml  36.44 ml/m LA Vol (A4C): 68.9 ml 32.95 ml/m  AORTIC VALVE LVOT Vmax:   89.60 cm/s LVOT Vmean:  58.200 cm/s LVOT VTI:    0.192 m  AORTA Ao Root diam: 2.70 cm Ao Asc diam:  2.90 cm MITRAL VALVE               TRICUSPID VALVE MV Area (PHT): 4.60 cm    TR Peak grad:   118.8 mmHg MV Decel Time: 165 msec    TR Vmax:        545.00 cm/s MV E velocity: 71.80 cm/s MV A velocity: 67.60 cm/s  SHUNTS MV E/A ratio:  1.06        Systemic VTI:  0.19 m                             Systemic Diam: 1.80 cm Wilbert Bihari MD Electronically signed by Wilbert Bihari MD Signature Date/Time: 12/12/2023/5:33:00 PM    Final    DG Chest 2 View Result Date: 12/11/2023 CLINICAL DATA:  Shortness of breath. EXAM: CHEST - 2 VIEW COMPARISON:  Chest radiograph dated 03/03/2023. FINDINGS: Mild cardiomegaly with mild vascular congestion. No focal consolidation, or pneumothorax. Small bilateral pleural effusions. No acute osseous pathology. Median sternotomy wires. IMPRESSION: Mild cardiomegaly with mild vascular congestion and small bilateral pleural effusions. Electronically Signed   By: Vanetta Chou M.D.   On: 12/11/2023 14:00    Lab Results: Personally reviewed CBC    Component Value Date/Time   WBC 4.7 12/15/2023 0526   RBC 3.34 (L) 12/15/2023 0526  HGB 9.7 (L) 12/15/2023 0526   HGB 12.1 04/10/2021 1248   HCT 30.5 (L) 12/15/2023 0526   HCT 37.0 04/10/2021 1248   PLT 234 12/15/2023 0526   PLT 261 04/10/2021 1248   MCV 91.3 12/15/2023 0526   MCV 83 04/10/2021 1248   MCH 29.0 12/15/2023 0526   MCHC 31.8 12/15/2023 0526   RDW 15.9 (H) 12/15/2023 0526   RDW 13.7 04/10/2021 1248   LYMPHSABS 1.5 12/12/2023 0537   LYMPHSABS 1.9 01/21/2021 1618   MONOABS 0.5 12/12/2023 0537   EOSABS 0.2 12/12/2023 0537   EOSABS 0.1 01/21/2021 1618   BASOSABS 0.0 12/12/2023 0537   BASOSABS 0.0 01/21/2021 1618    BMET    Component Value Date/Time   NA 142 12/22/2023 1035   NA 140 11/02/2022 1500   K 4.5 12/22/2023 1035   CL 107 12/22/2023 1035   CO2 26 12/22/2023 1035   GLUCOSE 82 12/22/2023 1035   BUN 34 (H) 12/22/2023 1035   BUN 28 (H) 11/02/2022 1500   CREATININE 1.70 (H) 12/22/2023 1035   CREATININE 1.02 02/27/2015 1236   CALCIUM  9.6 12/22/2023 1035   GFRNONAA 33 (L) 12/22/2023 1035   GFRNONAA 61 02/27/2015 1236   GFRAA 46 (L) 10/04/2019 0940   GFRAA 71 02/27/2015 1236    BNP    Component Value Date/Time   BNP 140.3 (H) 12/22/2023 1035    ProBNP    Component Value  Date/Time   PROBNP 1,633 (H) 02/27/2021 1453   PROBNP <30.0 11/12/2008 1828    Specialty Problems       Pulmonary Problems   DOE (dyspnea on exertion)   Obstructive sleep apnea   Lung nodule   Respiratory bronchiolitis interstitial lung disease (HCC)   Restrictive lung disease    Allergies  Allergen Reactions   Ciprofloxacin Rash    Immunization History  Administered Date(s) Administered   Influenza,inj,Quad PF,6+ Mos 03/28/2015, 02/18/2019, 04/27/2021, 01/20/2022   Influenza-Unspecified 12/29/2022   PNEUMOCOCCAL CONJUGATE-20 04/27/2021   Pneumococcal Conjugate (Pcv15) 12/29/2022   Pneumococcal Polysaccharide-23 01/31/2018, 06/23/2018   Zoster Recombinant(Shingrix) 06/13/2019, 12/03/2022    Past Medical History:  Diagnosis Date   Allergy    Anemia    CAD in native artery 02/18/2004   Mid LAD lesoin just after Large D1 --> CABG x 1 LIMA-LAD   CAP (community acquired pneumonia) 08/07/2014   CHF (congestive heart failure) (HCC)    Chronic kidney disease, stage 3b (HCC)    Depression    High cholesterol    History of blood transfusion 04/20/2003   related to [facility]    Hypertension    Lung nodules    Mild carotid artery disease (HCC)    Pulmonary hypertension (HCC)    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 competitive flow   TIA (transient ischemic attack)    Type II diabetes mellitus (HCC)     Tobacco History: Social History   Tobacco Use  Smoking Status Every Day   Current packs/day: 0.25   Average packs/day: 0.5 packs/day for 50.7 years (25.2 ttl pk-yrs)   Types: Cigarettes   Start date: 1975  Smokeless Tobacco Current  Tobacco Comments   10 Cigarettes a day. 08/22/2023   Ready to quit: Not Answered Counseling given: Not Answered Tobacco comments: 10 Cigarettes a day. 08/22/2023   Continue to not smoke  Outpatient Encounter Medications as of 12/28/2023  Medication Sig   acetaminophen  (TYLENOL ) 500  MG tablet Take  1,000 mg by mouth every 6 (six) hours as needed for headache or moderate pain (pain score 4-6).   Alcohol Swabs (B-D SINGLE USE SWABS REGULAR) PADS USE AS DIRECTED   aspirin  EC 81 MG tablet Take 81 mg by mouth daily.   atorvastatin  (LIPITOR) 10 MG tablet TAKE 1 TABLET ONE TIME DAILY   Blood Glucose Monitoring Suppl (ONETOUCH VERIO) w/Device KIT Use as instructed to test blood glucose 3 times daily.   Continuous Glucose Receiver (DEXCOM G7 RECEIVER) DEVI Use to check blood glucose continuously.   Continuous Glucose Sensor (DEXCOM G7 SENSOR) MISC Use to check blood glucose throughout the day. Changes sensors once every 10 days.   cyanocobalamin  (VITAMIN B12) 1000 MCG tablet Take 1,000 mcg by mouth daily.   dapagliflozin  propanediol (FARXIGA ) 10 MG TABS tablet TAKE 1 TABLET EVERY DAY BEFORE BREAKFAST   diclofenac  Sodium (VOLTAREN ) 1 % GEL Apply 2 g topically daily as needed (muscle pain).   digoxin  (LANOXIN ) 0.125 MG tablet Take 1 tablet (0.125 mg total) by mouth daily.   furosemide  (LASIX ) 40 MG tablet Take 2 tablets (80 mg total) by mouth 2 (two) times daily.   gabapentin  (NEURONTIN ) 300 MG capsule Take 300 mg by mouth 2 (two) times daily.   glucose blood (ONETOUCH VERIO) test strip Use as instructed to test blood glucose 3 times daily.   Insulin  Pen Needle (DROPLET PEN NEEDLES) 31G X 8 MM MISC USE WITH TOUJEO  SOLOSTAR PEN EVERY DAY   Lancets (ONETOUCH DELICA PLUS LANCET33G) MISC Use as instructed to test blood glucose 3 times daily.   Macitentan -Tadalafil  (OPSYNVI) 10-40 MG TABS Take 1 tablet by mouth in the morning.   metFORMIN  (GLUCOPHAGE -XR) 500 MG 24 hr tablet Take 2 tablets (1,000 mg total) by mouth 2 (two) times daily. (Patient taking differently: Take 500 mg by mouth 2 (two) times daily.)   Multiple Vitamin (MULTIVITAMIN WITH MINERALS) TABS tablet Take 1 tablet by mouth daily.   nitroGLYCERIN  (NITROSTAT ) 0.4 MG SL tablet Place 1 tablet (0.4 mg total) under the tongue every 5  (five) minutes as needed for chest pain.   pantoprazole  (PROTONIX ) 40 MG tablet TAKE 1 TABLET TWICE DAILY   TOUJEO  MAX SOLOSTAR 300 UNIT/ML Solostar Pen INJECT 50 UNITS INTO THE SKIN DAILY (DOSE DECREASE)   Vitamin D , Cholecalciferol, 25 MCG (1000 UT) TABS Take 1,000 mcg by mouth daily.   No facility-administered encounter medications on file as of 12/28/2023.     Review of Systems  Review of Systems  No chest pain exertion.  No orthopnea or PND.  Comprehensive review of systems otherwise negative. Physical Exam  BP 117/84   Pulse 100   Ht 5' 4 (1.626 m)   Wt 223 lb (101.2 kg)   LMP  (LMP Unknown)   SpO2 92%   BMI 38.28 kg/m   Wt Readings from Last 5 Encounters:  12/28/23 223 lb (101.2 kg)  12/26/23 225 lb (102.1 kg)  12/22/23 227 lb 6.4 oz (103.1 kg)  12/16/23 220 lb (99.8 kg)  12/12/23 227 lb 15.3 oz (103.4 kg)    BMI Readings from Last 5 Encounters:  12/28/23 38.28 kg/m  12/26/23 37.44 kg/m  12/22/23 37.84 kg/m  12/16/23 36.61 kg/m  12/12/23 37.93 kg/m     Physical Exam General: Sitting in chair, no acute distress Eyes: EOMI, no icterus Neck: Supple, no JVP Pulmonary: Clear, no work of breathing Cardiovascular: Regular in rhythm, trace edema at the ankle Abdomen: Nondistended MSK: No synovitis, no joint effusion Neuro: Normal gait,  no weakness Psych: Normal mood, full affect   Assessment & Plan:   Pulmonary hypertension: Largely group 1 PAH with significant elevated PVR 10, mean PA pressure 42, wedge of 6, depressed cardiac index 1.9.  Sildenafil  transition to tadalafil /macitentan  combo pill.  Tolerating well.  Stressed importance of this as well as good adherence to diuretics.  Recently increased with increase in weight.  Reported dry weight around 220. 223 today.  Appears relatively euvolemic.  Tobacco abuse: 25-pack-year history.  Lung RADS 2 12/2022 1 year follow-up 2025 not yet scheduled.  New order today.  Previous nodule resolved.  Scattered  endobronchial nodules RB ILD stable.   Return in about 3 months (around 03/28/2024) for f/u Dr. Annella.   Donnice JONELLE Annella, MD 12/28/2023   This appointment required 41 minutes of patient care (this includes precharting, chart review, review of results, face-to-face care, etc.).

## 2023-12-30 ENCOUNTER — Telehealth: Payer: Self-pay | Admitting: Licensed Clinical Social Worker

## 2023-12-30 NOTE — Patient Outreach (Signed)
 LCSW spoke with pt and introduced self, explained role in Complex Care Management, and scheduled initial appt.

## 2023-12-30 NOTE — Patient Outreach (Signed)
 BSW reached out to pt to schedule initial call. Pt was scheduled for initial outreach on 01/06/2024 at 11AM. Pt was provided BSW direct contact # should she need to reschedule appt.

## 2024-01-02 ENCOUNTER — Ambulatory Visit: Attending: Family Medicine | Admitting: Family Medicine

## 2024-01-02 ENCOUNTER — Telehealth: Payer: Self-pay | Admitting: Family Medicine

## 2024-01-02 ENCOUNTER — Encounter: Payer: Self-pay | Admitting: Family Medicine

## 2024-01-02 ENCOUNTER — Telehealth (HOSPITAL_COMMUNITY): Payer: Self-pay | Admitting: Internal Medicine

## 2024-01-02 VITALS — BP 128/70 | HR 92 | Ht 64.0 in | Wt 223.8 lb

## 2024-01-02 DIAGNOSIS — D649 Anemia, unspecified: Secondary | ICD-10-CM | POA: Diagnosis not present

## 2024-01-02 DIAGNOSIS — L304 Erythema intertrigo: Secondary | ICD-10-CM

## 2024-01-02 DIAGNOSIS — E1159 Type 2 diabetes mellitus with other circulatory complications: Secondary | ICD-10-CM

## 2024-01-02 DIAGNOSIS — I130 Hypertensive heart and chronic kidney disease with heart failure and stage 1 through stage 4 chronic kidney disease, or unspecified chronic kidney disease: Secondary | ICD-10-CM | POA: Diagnosis not present

## 2024-01-02 DIAGNOSIS — Z23 Encounter for immunization: Secondary | ICD-10-CM | POA: Diagnosis not present

## 2024-01-02 DIAGNOSIS — Z794 Long term (current) use of insulin: Secondary | ICD-10-CM

## 2024-01-02 DIAGNOSIS — I272 Pulmonary hypertension, unspecified: Secondary | ICD-10-CM | POA: Diagnosis not present

## 2024-01-02 MED ORDER — CLOTRIMAZOLE 1 % EX CREA: 1.0000 | TOPICAL_CREAM | Freq: Two times a day (BID) | CUTANEOUS | 1 refills | Status: DC

## 2024-01-02 NOTE — Patient Instructions (Signed)
 VISIT SUMMARY:  Today, we reviewed your recent hospitalization and current health concerns. We discussed your heart failure, pulmonary artery hypertension, diabetes, and a fungal rash. We also addressed your tinnitus and issues with your Dexcom device.  YOUR PLAN:  -HEART FAILURE WITH ANEMIA AND HYPERTENSION IN STAGE II CHRONIC KIDNEY DISEASE: Heart failure means your heart isn't pumping blood as well as it should. Anemia is a condition where you don't have enough healthy red blood cells. Hypertension is high blood pressure, and stage II chronic kidney disease means your kidneys are not working as well as they should. We will check your blood count for anemia and monitor your kidney function. Continue taking furosemide  as prescribed and follow up with your cardiologist tomorrow.  -PULMONARY ARTERY HYPERTENSION: Pulmonary artery hypertension is high blood pressure in the arteries that go from your heart to your lungs. You are under the care of a pulmonologist and have an upcoming test for pulmonary nodules. Please discuss any new medications with your specialist due to potential effects on your liver and kidneys.  -TYPE 2 DIABETES MELLITUS: Type 2 diabetes is a condition where your body doesn't use insulin  properly, leading to high blood sugar levels. You reported issues with your Dexcom G6 sensor. We have sent a prescription for the G7 sensor and will follow up with the pharmacy to ensure you receive the correct components.  -INTERTRIGO (FUNGAL RASH UNDER BREAST AND BUTTOCKS): Intertrigo is a fungal rash that occurs in warm, moist areas of the body. We will prescribe an antifungal cream to treat the rash. Wearing loose clothing can help improve air circulation and prevent sweating.  -TINNITUS: Tinnitus is a ringing or thumping sound in your ears. It is likely related to your high blood pressure or vascular issues. We will continue to monitor this condition.  INSTRUCTIONS:  Please follow up with  your cardiologist tomorrow for your heart failure and related conditions. Also, follow up with your pulmonologist for the upcoming test on your pulmonary nodules. Ensure you receive the correct components for your Dexcom G7 sensor from the pharmacy.

## 2024-01-02 NOTE — Progress Notes (Signed)
 Subjective:  Patient ID: Deborah Coffey, female    DOB: [birth year]  Age: 68 y.o. MRN: [id number]  CC: Hospitalization Follow-up (Rash on different parts of her body)     Discussed the use of AI scribe software for clinical note transcription with the patient, who gave verbal consent to proceed.  History of Present Illness Deborah Coffey is a 68 year old female with a history of Type 2 DM (A1c 6.4), Diabetic neuropathy, Hypertension, CAD (s/p CABG), pulmonary hypertension with RV strain (EF 60 to 65%, enlarged right RV, severely elevated PA pressure, severely dilated RA from echo of 02/2023) ,CKD  who presents for follow-up after a recent hospitalization.  She was hospitalized last month for anemia, shortness of breath, and heart failure exacerbation, requiring diuretics for fluid overload. Post-discharge, she manages fluid intake and monitors her weight daily, remaining stable. Her kidney function was abnormal during hospitalization, leading to a temporary stop of furosemide , which she has since resumed. She does not currently see a nephrologist. She did have a visit with Pulmonary last week.  She experiences tinnitus, described as a 'boom, boom, boom' sound, which began after her hospital discharge. Her medication was changed from metoprolol  to digoxin  during her stay, and she is awaiting a refill for digoxin .  She has a rash on her foot, under her breast, and between her buttocks. A previous prescription was ineffective, but another cream provided relief. The rash is not itchy.  She is having issues with her Dexcom device due to a missing component from the shipment, identified by her daughter.    Past Medical History:  Diagnosis Date   Allergy    Anemia    CAD in native artery 02/18/2004   Mid LAD lesoin just after Large D1 --> CABG x 1 LIMA-LAD   CAP (community acquired pneumonia) 08/07/2014   CHF (congestive heart failure) (HCC)    Chronic kidney disease, stage 3b  (HCC)    Depression    High cholesterol    History of blood transfusion 04/20/2003   related to OHS    Hypertension    Lung nodules    Mild carotid artery disease (HCC)    Pulmonary hypertension (HCC)    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 competitive flow   TIA (transient ischemic attack)    Type II diabetes mellitus (HCC)     Past Surgical History:  Procedure Laterality Date   CARDIAC CATHETERIZATION  2010   Previous LAD 95% lesion - now ~30-40%; patent LIMA with competitive flow   CESAREAN SECTION  1977; 1989   CHOLECYSTECTOMY  ~ 2012   CORONARY ARTERY BYPASS GRAFT  02/2004   CABG X1 (03/22/2013); LIMA-LAD   RIGHT HEART CATH N/A 07/14/2023   Procedure: RIGHT HEART CATH;  Surgeon: Cherrie Toribio SAUNDERS, MD;  Location: [facility] INVASIVE CV LAB;  Service: Cardiovascular;  Laterality: N/A;   RIGHT/LEFT HEART CATH AND CORONARY/GRAFT ANGIOGRAPHY N/A 04/16/2021   Procedure: RIGHT/LEFT HEART CATH AND CORONARY/GRAFT ANGIOGRAPHY;  Surgeon: Dann Candyce RAMAN, MD;  Location: [facility] INVASIVE CV LAB;  Service: Cardiovascular;  Laterality: N/A;   TUBAL LIGATION  1989    Family History  Problem Relation Age of Onset   Heart disease Mother    Diabetes Mother    Cancer Mother        cervical   Diabetes Father    Diabetes Sister    Heart disease Sister    Lupus Sister  Heart failure Sister    Hepatitis C Sister    Hypertension Daughter    Diabetes Brother    Heart attack Brother    Heart failure Brother    Diabetes Brother    Colon cancer Neg Hx    Breast cancer Neg Hx     Social History   Socioeconomic History   Marital status: Divorced    Spouse name: Not on file   Number of children: Not on file   Years of education: 12   Highest education level: Some college, no degree  Occupational History   Occupation: Disabled  Tobacco Use   Smoking status: Every Day    Current packs/day: 0.25    Average packs/day: 0.5 packs/day  for 50.7 years (25.2 ttl pk-yrs)    Types: Cigarettes    Start date: 1975   Smokeless tobacco: Current   Tobacco comments:    10 Cigarettes a day. 08/22/2023  Vaping Use   Vaping status: Some Days   Substances: Flavoring  Substance and Sexual Activity   Alcohol use: Not Currently   Drug use: No    Types: Marijuana    Comment: smoked pot years ago but not currently   Sexual activity: Not Currently  Other Topics Concern   Not on file  Social History Narrative   Lives with daughter and 3 grandchildren   Social Drivers of Health   Financial Resource Strain: Low Risk  (08/23/2023)   Overall Financial Resource Strain (CARDIA)    Difficulty of Paying Living Expenses: Not hard at all  Food Insecurity: No Food Insecurity (12/16/2023)   Hunger Vital Sign    Worried About Running Out of Food in the Last Year: Never true    Ran Out of Food in the Last Year: Never true  Transportation Needs: No Transportation Needs (12/16/2023)   PRAPARE - Administrator, Civil Service (Medical): No    Lack of Transportation (Non-Medical): No  Physical Activity: Inactive (08/23/2023)   Exercise Vital Sign    Days of Exercise per Week: 0 days    Minutes of Exercise per Session: 0 min  Stress: No Stress Concern Present (08/23/2023)   [employer] of Occupational Health - Occupational Stress Questionnaire    Feeling of Stress : Not at all  Social Connections: Unknown (12/12/2023)   Social Connection and Isolation Panel    Frequency of Communication with Friends and Family: More than three times a week    Frequency of Social Gatherings with Friends and Family: More than three times a week    Attends Religious Services: Not on Marketing executive or Organizations: Not on file    Attends Banker Meetings: Not on file    Marital Status: Not on file    Allergies  Allergen Reactions   Ciprofloxacin Rash    Outpatient Medications Prior to Visit  Medication Sig Dispense  Refill   acetaminophen  (TYLENOL ) 500 MG tablet Take 1,000 mg by mouth every 6 (six) hours as needed for headache or moderate pain (pain score 4-6).     Alcohol Swabs (B-D SINGLE USE SWABS REGULAR) PADS USE AS DIRECTED 100 each 0   aspirin  EC 81 MG tablet Take 81 mg by mouth daily.     atorvastatin  (LIPITOR) 10 MG tablet TAKE 1 TABLET ONE TIME DAILY 90 tablet 1   Blood Glucose Monitoring Suppl (ONETOUCH VERIO) w/Device KIT Use as instructed to test blood glucose 3 times daily. 1 kit 0  Continuous Glucose Receiver (DEXCOM G7 RECEIVER) DEVI Use to check blood glucose continuously. 1 each 0   Continuous Glucose Sensor (DEXCOM G7 SENSOR) MISC Use to check blood glucose throughout the day. Changes sensors once every 10 days. 3 each 6   cyanocobalamin  (VITAMIN B12) 1000 MCG tablet Take 1,000 mcg by mouth daily.     dapagliflozin  propanediol (FARXIGA ) 10 MG TABS tablet TAKE 1 TABLET EVERY DAY BEFORE BREAKFAST 90 tablet 1   diclofenac  Sodium (VOLTAREN ) 1 % GEL Apply 2 g topically daily as needed (muscle pain). 150 g 1   digoxin  (LANOXIN ) 0.125 MG tablet Take 1 tablet (0.125 mg total) by mouth daily. 30 tablet 0   furosemide  (LASIX ) 40 MG tablet Take 2 tablets (80 mg total) by mouth 2 (two) times daily. 180 tablet 1   gabapentin  (NEURONTIN ) 300 MG capsule Take 300 mg by mouth 2 (two) times daily.     glucose blood (ONETOUCH VERIO) test strip Use as instructed to test blood glucose 3 times daily. 100 each 3   Insulin  Pen Needle (DROPLET PEN NEEDLES) 31G X 8 MM MISC USE WITH TOUJEO  SOLOSTAR PEN EVERY DAY 100 each 0   Lancets (ONETOUCH DELICA PLUS LANCET33G) MISC Use as instructed to test blood glucose 3 times daily. 100 each 3   Macitentan -Tadalafil  (OPSYNVI) 10-40 MG TABS Take 1 tablet by mouth in the morning.     metFORMIN  (GLUCOPHAGE -XR) 500 MG 24 hr tablet Take 2 tablets (1,000 mg total) by mouth 2 (two) times daily. (Patient taking differently: Take 500 mg by mouth 2 (two) times daily.) 360 tablet 1    Multiple Vitamin (MULTIVITAMIN WITH MINERALS) TABS tablet Take 1 tablet by mouth daily.     nitroGLYCERIN  (NITROSTAT ) 0.4 MG SL tablet Place 1 tablet (0.4 mg total) under the tongue every 5 (five) minutes as needed for chest pain. 25 tablet 0   pantoprazole  (PROTONIX ) 40 MG tablet TAKE 1 TABLET TWICE DAILY 180 tablet 1   TOUJEO  MAX SOLOSTAR 300 UNIT/ML Solostar Pen INJECT 50 UNITS INTO THE SKIN DAILY (DOSE DECREASE) 18 mL 3   Vitamin D , Cholecalciferol, 25 MCG (1000 UT) TABS Take 1,000 mcg by mouth daily.     No facility-administered medications prior to visit.     ROS Review of Systems  Constitutional:  Negative for activity change, appetite change and fatigue.  HENT:  Positive for tinnitus. Negative for congestion, sinus pressure and sore throat.   Eyes:  Negative for visual disturbance.  Respiratory:  Negative for cough, chest tightness, shortness of breath and wheezing.   Cardiovascular:  Negative for chest pain and palpitations.  Gastrointestinal:  Negative for abdominal distention, abdominal pain and constipation.  Endocrine: Negative for polydipsia.  Genitourinary:  Negative for dysuria and frequency.  Musculoskeletal:  Negative for arthralgias and back pain.  Skin:  Positive for rash.  Neurological:  Negative for tremors, light-headedness and numbness.  Hematological:  Does not bruise/bleed easily.  Psychiatric/Behavioral:  Negative for agitation and behavioral problems.     Objective:  BP 128/70   Pulse 92   Ht 5' 4 (1.626 m)   Wt 223 lb 12.8 oz (101.5 kg)   LMP  (LMP Unknown)   SpO2 96%   BMI 38.42 kg/m      01/02/2024   10:19 AM 12/28/2023    3:39 PM 12/26/2023    2:09 PM  BP/Weight  Systolic BP 128 117   Diastolic BP 70 84   Wt. (Lbs) 223.8 223 225  BMI 38.42 kg/m2  38.28 kg/m2 37.44 kg/m2      Physical Exam Constitutional:      Appearance: She is well-developed.  HENT:     Right Ear: Tympanic membrane normal.     Left Ear: Tympanic membrane normal.   Cardiovascular:     Rate and Rhythm: Normal rate.     Heart sounds: Normal heart sounds. No murmur heard. Pulmonary:     Effort: Pulmonary effort is normal.     Breath sounds: Normal breath sounds. No wheezing or rales.  Chest:     Chest wall: No tenderness.  Abdominal:     General: Bowel sounds are normal. There is no distension.     Palpations: Abdomen is soft. There is no mass.     Tenderness: There is no abdominal tenderness.  Musculoskeletal:        General: Normal range of motion.     Right lower leg: No edema.     Left lower leg: No edema.  Skin:    Comments: Rash with weeping of skin in bilateral inframammary region.  Dry scaly rash on dorsum of right foot and left elbow.  Neurological:     Mental Status: She is alert and oriented to person, place, and time.  Psychiatric:        Mood and Affect: Mood normal.        Latest Ref Rng & Units 12/22/2023   10:35 AM 12/15/2023    5:26 AM 12/14/2023    4:48 AM  CMP  Glucose 70 - 99 mg/dL 82  99  858   BUN 8 - 23 mg/dL 34  34  31   Creatinine 0.44 - 1.00 mg/dL 8.29  8.17  8.26   Sodium 135 - 145 mmol/L 142  137  139   Potassium 3.5 - 5.1 mmol/L 4.5  4.3  4.1   Chloride 98 - 111 mmol/L 107  104  106   CO2 22 - 32 mmol/L 26  22  22    Calcium  8.9 - 10.3 mg/dL 9.6  9.3  8.7     Lipid Panel     Component Value Date/Time   CHOL 161 01/21/2021 1618   TRIG 104 01/21/2021 1618   HDL 72 01/21/2021 1618   CHOLHDL 2.2 01/21/2021 1618   CHOLHDL 2.2 09/02/2016 0533   VLDL 13 09/02/2016 0533   LDLCALC 70 01/21/2021 1618    CBC    Component Value Date/Time   WBC 4.7 12/15/2023 0526   RBC 3.34 (L) 12/15/2023 0526   HGB 9.7 (L) 12/15/2023 0526   HGB 12.1 04/10/2021 1248   HCT 30.5 (L) 12/15/2023 0526   HCT 37.0 04/10/2021 1248   PLT 234 12/15/2023 0526   PLT 261 04/10/2021 1248   MCV 91.3 12/15/2023 0526   MCV 83 04/10/2021 1248   MCH 29.0 12/15/2023 0526   MCHC 31.8 12/15/2023 0526   RDW 15.9 (H) 12/15/2023 0526   RDW  13.7 04/10/2021 1248   LYMPHSABS 1.5 12/12/2023 0537   LYMPHSABS 1.9 01/21/2021 1618   MONOABS 0.5 12/12/2023 0537   EOSABS 0.2 12/12/2023 0537   EOSABS 0.1 01/21/2021 1618   BASOSABS 0.0 12/12/2023 0537   BASOSABS 0.0 01/21/2021 1618    Lab Results  Component Value Date   HGBA1C 6.1 (H) 12/11/2023       Assessment & Plan 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 Restarted furosemide   post-hospitalization.   Kidney function abnormal during hospitalization, necessitating temporary cessation of furosemide  which she has restarted - Check kidney function to monitor furosemide  impact. - Continue furosemide  as prescribed. - Follow up with cardiologist tomorrow. - Referral to nephrology if renal function is still abnormal  Anemia Hemoglobin of 9.7 at discharge Anemia chronic disease also possible Will check hemoglobin again today  Pulmonary artery hypertension Under pulmonologist care. Upcoming imaging for pulmonary nodules follow-up.  - Currently on Opsynvi. - Follow up with pulmonologist for pulmonary nodules.  Type 2 diabetes mellitus Controlled with A1c of 6.1 Dexcom G6 sensor issues reported. Prescription for G7 sent two weeks ago. Awaiting delivery of correct components. - Pharmacy to follow up on G7 prescription delivery. - Write prescription for missing Dexcom components.  Intertrigo (fungal rash under breast and buttocks) Fungal rash likely due to sweating. Previous prescription ineffective. Rash involves peeling skin. - Prescribe antifungal cream for intertrigo. - Advise wearing loose clothing for air circulation.  Tinnitus Tinnitus described as a thumping sound Onset since hospital discharge. Discussed pathophysiology of tinnitus Advised to wear earbuds       Meds ordered this encounter  Medications   clotrimazole   (LOTRIMIN ) 1 % cream    Sig: Apply 1 Application topically 2 (two) times daily.    Dispense:  85 g    Refill:  1    Follow-up: Return for previously scheduled appointment.       Corrina Sabin, MD, FAAFP. [facility] [place], [place] [phone]   01/02/2024, 1:13 PM

## 2024-01-02 NOTE — Telephone Encounter (Signed)
 Called to confirm/remind patient of their appointment [facility] on 01/02/2024.   Appointment:   [x] Confirmed  [] Left mess   [] No answer/No voice mail  [] VM Full/unable to leave message  [] Phone not in service  Patient reminded to bring all medications and/or complete list.  Confirmed patient has transportation. Gave directions, instructed to utilize valet parking.

## 2024-01-02 NOTE — Telephone Encounter (Signed)
 A Prescription for Dexcom G6 was sent to [facility] for her 2 weeks ago can you please follow up on this. A few months back she received a G6 insertion device but no transmitter. Thanks

## 2024-01-03 ENCOUNTER — Ambulatory Visit (HOSPITAL_COMMUNITY)

## 2024-01-03 ENCOUNTER — Ambulatory Visit (HOSPITAL_COMMUNITY)
Admission: RE | Admit: 2024-01-03 | Discharge: 2024-01-03 | Disposition: A | Discharge status: Home / Self Care | Source: Ambulatory Visit | Attending: Internal Medicine | Admitting: Internal Medicine

## 2024-01-03 ENCOUNTER — Other Ambulatory Visit: Payer: Self-pay | Admitting: Pharmacist

## 2024-01-03 ENCOUNTER — Telehealth: Payer: Self-pay | Admitting: *Deleted

## 2024-01-03 ENCOUNTER — Encounter: Payer: Self-pay | Admitting: *Deleted

## 2024-01-03 ENCOUNTER — Other Ambulatory Visit: Payer: Self-pay | Admitting: *Deleted

## 2024-01-03 VITALS — BP 130/60 | HR 102 | Wt 223.6 lb

## 2024-01-03 DIAGNOSIS — Z7985 Long-term (current) use of injectable non-insulin antidiabetic drugs: Secondary | ICD-10-CM | POA: Insufficient documentation

## 2024-01-03 DIAGNOSIS — F32A Depression, unspecified: Secondary | ICD-10-CM | POA: Insufficient documentation

## 2024-01-03 DIAGNOSIS — Z79899 Other long term (current) drug therapy: Secondary | ICD-10-CM | POA: Diagnosis not present

## 2024-01-03 DIAGNOSIS — Z794 Long term (current) use of insulin: Secondary | ICD-10-CM

## 2024-01-03 DIAGNOSIS — I5032 Chronic diastolic (congestive) heart failure: Secondary | ICD-10-CM | POA: Diagnosis not present

## 2024-01-03 DIAGNOSIS — E785 Hyperlipidemia, unspecified: Secondary | ICD-10-CM | POA: Diagnosis not present

## 2024-01-03 DIAGNOSIS — Z7984 Long term (current) use of oral hypoglycemic drugs: Secondary | ICD-10-CM | POA: Diagnosis not present

## 2024-01-03 DIAGNOSIS — F17210 Nicotine dependence, cigarettes, uncomplicated: Secondary | ICD-10-CM | POA: Diagnosis not present

## 2024-01-03 DIAGNOSIS — Z720 Tobacco use: Secondary | ICD-10-CM | POA: Diagnosis not present

## 2024-01-03 DIAGNOSIS — I272 Pulmonary hypertension, unspecified: Secondary | ICD-10-CM | POA: Insufficient documentation

## 2024-01-03 DIAGNOSIS — I2510 Atherosclerotic heart disease of native coronary artery without angina pectoris: Secondary | ICD-10-CM | POA: Diagnosis not present

## 2024-01-03 DIAGNOSIS — D631 Anemia in chronic kidney disease: Secondary | ICD-10-CM | POA: Diagnosis not present

## 2024-01-03 DIAGNOSIS — I50810 Right heart failure, unspecified: Secondary | ICD-10-CM

## 2024-01-03 DIAGNOSIS — I50812 Chronic right heart failure: Secondary | ICD-10-CM | POA: Insufficient documentation

## 2024-01-03 DIAGNOSIS — I2781 Cor pulmonale (chronic): Secondary | ICD-10-CM | POA: Insufficient documentation

## 2024-01-03 DIAGNOSIS — I130 Hypertensive heart and chronic kidney disease with heart failure and stage 1 through stage 4 chronic kidney disease, or unspecified chronic kidney disease: Secondary | ICD-10-CM | POA: Insufficient documentation

## 2024-01-03 DIAGNOSIS — N1832 Chronic kidney disease, stage 3b: Secondary | ICD-10-CM | POA: Diagnosis not present

## 2024-01-03 DIAGNOSIS — E1122 Type 2 diabetes mellitus with diabetic chronic kidney disease: Secondary | ICD-10-CM | POA: Insufficient documentation

## 2024-01-03 DIAGNOSIS — Z8673 Personal history of transient ischemic attack (TIA), and cerebral infarction without residual deficits: Secondary | ICD-10-CM | POA: Diagnosis not present

## 2024-01-03 DIAGNOSIS — Z6838 Body mass index (BMI) 38.0-38.9, adult: Secondary | ICD-10-CM | POA: Diagnosis not present

## 2024-01-03 DIAGNOSIS — G4733 Obstructive sleep apnea (adult) (pediatric): Secondary | ICD-10-CM | POA: Insufficient documentation

## 2024-01-03 DIAGNOSIS — I129 Hypertensive chronic kidney disease with stage 1 through stage 4 chronic kidney disease, or unspecified chronic kidney disease: Secondary | ICD-10-CM | POA: Diagnosis not present

## 2024-01-03 DIAGNOSIS — Z951 Presence of aortocoronary bypass graft: Secondary | ICD-10-CM | POA: Diagnosis not present

## 2024-01-03 MED ORDER — FUROSEMIDE 40 MG PO TABS: 60.0000 mg | ORAL_TABLET | Freq: Two times a day (BID) | ORAL | Status: DC

## 2024-01-03 MED ORDER — DEXCOM G7 SENSOR MISC: 6 refills | Status: DC

## 2024-01-03 MED ORDER — DIGOXIN 125 MCG PO TABS: 0.1250 mg | ORAL_TABLET | Freq: Every day | ORAL | 6 refills | Status: DC

## 2024-01-03 MED ORDER — DEXCOM G7 RECEIVER DEVI: 0 refills | Status: DC

## 2024-01-03 NOTE — Transitions of Care (Post Inpatient/ED Visit) (Signed)
 Transition of Care week 3  Visit Note  01/03/2024  Name: Deborah Coffey MRN: [id number]          DOB: 01/13/1956  Situation: Patient enrolled in [facility] 30-day program. Visit completed with [name] by telephone.   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 competitive flow   TIA (transient ischemic attack)    Type II diabetes mellitus (HCC)     Assessment: Patient Reported Symptoms: Cognitive Cognitive Status: Able to follow simple commands, Normal speech and language skills, Alert and oriented to person, place, and time      Neurological Neurological Review of Symptoms: No symptoms reported    HEENT HEENT Symptoms Reported: No symptoms reported      Cardiovascular Cardiovascular Symptoms Reported: Dizziness Cardiovascular Self-Management Outcome: 3 (uncertain) Cardiovascular Comment: Seen by Cardiology today and discussed dizziness. Provider has cut back on Lasix  due to possible dehydration. Patient continues to weigh herself daily.  Respiratory Respiratory Symptoms Reported: No symptoms reported    Endocrine Endocrine Symptoms Reported: No symptoms reported Is patient diabetic?: Yes Is patient checking blood sugars at home?: No Endocrine Self-Management Outcome: 3 (uncertain) Endocrine Comment: Patient has not received Dexcom, reports her pharmacy has not  received the prescription for Dexcom. RNCM reached out to Pharmacist/Luke to request prescription to [facility] on [address].  Gastrointestinal Gastrointestinal Symptoms Reported: No symptoms reported      Genitourinary Genitourinary Symptoms Reported: No symptoms reported    Integumentary Integumentary Symptoms Reported: Rash Skin Self-Management Outcome: 3 (uncertain) Skin Comment: Discussed with PCP during recent OV. Patient has not picked up new prescription, but is planning to do so today.  Musculoskeletal Musculoskelatal Symptoms Reviewed: Limited mobility Additional Musculoskeletal Details: ambulating with rollator at times Musculoskeletal Self-Management Outcome: 4 (good)      Psychosocial Psychosocial Symptoms Reported: Not assessed         There were no vitals filed for this visit.  Medications Reviewed Today     Reviewed by Lucky Andrea LABOR, RN (Registered Nurse) on 01/03/24 at 1500  Med List Status: <None>   Medication Order Taking? Sig Documenting Provider Last Dose Status Informant  acetaminophen  (TYLENOL ) 500 MG tablet [id number] Yes Take 1,000 mg by mouth every 6 (six) hours as needed for headache or moderate pain (pain score 4-6). [provider]  Active Self, Pharmacy Records  Alcohol Swabs (B-D SINGLE USE SWABS REGULAR) PADS [id number] Yes USE AS DIRECTED Jegede, Olugbemiga E, MD  Active Self, Pharmacy Records  aspirin  EC 81 MG tablet [id number] Yes Take 81 mg by mouth daily. [provider]  Active Self, Pharmacy Records  atorvastatin  (LIPITOR) 10 MG tablet [id number] Yes TAKE 1 TABLET ONE TIME DAILY Newlin, Enobong, MD  Active Self, Pharmacy Records  Blood Glucose Monitoring Suppl [facility]  VERIO) w/Device KIT [id number] Yes Use as instructed to test blood glucose 3 times daily. Newlin, Enobong, MD  Active Self, Pharmacy Records  clotrimazole  (LOTRIMIN ) 1 % cream [id number]  Apply 1 Application topically 2 (two) times daily.  Patient not taking: Reported on  01/03/2024   Newlin, Enobong, MD  Active   Continuous Glucose Receiver [facility] G7 RECEIVER) DEVI [id number]  Use to check blood glucose continuously. Newlin, Enobong, MD  Active   Continuous Glucose Sensor (DEXCOM G7 SENSOR) MISC [id number]  Use to check blood glucose throughout the day. Changes sensors once every 10 days. Newlin, Enobong, MD  Active   cyanocobalamin  (VITAMIN B12) 1000 MCG tablet [id number] Yes Take 1,000 mcg by mouth daily. [provider]  Active Self, Pharmacy Records  dapagliflozin  propanediol (FARXIGA ) 10 MG TABS tablet [id number] Yes TAKE 1 TABLET EVERY DAY BEFORE BREAKFAST Newlin, Enobong, MD  Active Self, Pharmacy Records  diclofenac  Sodium (VOLTAREN ) 1 % GEL [id number] Yes Apply 2 g topically daily as needed (muscle pain). Newlin, Enobong, MD  Active Self, Pharmacy Records           Med Note RANELLE KEENE HERO   Tue Jan 03, 2024 12:18 PM)    digoxin  (LANOXIN ) 0.125 MG tablet [id number] Yes Take 1 tablet (0.125 mg total) by mouth daily. Bensimhon, Toribio SAUNDERS, MD  Active   furosemide  (LASIX ) 40 MG tablet [id number] Yes Take 1.5 tablets (60 mg total) by mouth 2 (two) times daily. Bensimhon, Toribio SAUNDERS, MD  Active   gabapentin  (NEURONTIN ) 300 MG capsule [id number] Yes Take 300 mg by mouth 2 (two) times daily. [provider]  Active Self, Pharmacy Records  glucose blood [facility] VERIO) test strip [id number] Yes Use as instructed to test blood glucose 3 times daily. Newlin, Enobong, MD  Active Self, Pharmacy Records  Insulin  Pen Needle (DROPLET PEN NEEDLES) 31G X 8 MM MISC [id number] Yes USE WITH TOUJEO  SOLOSTAR PEN EVERY DAY [name], MD  Active Self, Pharmacy Records  Lancets [facility] DELICA PLUS [place]) [place] [id number] Yes Use as instructed to test blood glucose 3 times daily. Newlin, Enobong, MD  Active Self, Pharmacy Records  Macitentan -Tadalafil  (OPSYNVI) 10-40 MG TABS [id number] Yes Take 1 tablet by mouth in the morning. [provider]  Active    metFORMIN  (GLUCOPHAGE ) 1000 MG tablet [id number] Yes Take 1,000 mg by mouth daily. [provider]  Active   Multiple Vitamin (MULTIVITAMIN WITH MINERALS) TABS tablet [id number] Yes Take 1 tablet by mouth daily. [provider]  Active Self, Pharmacy Records  nitroGLYCERIN  (NITROSTAT ) 0.4 MG SL tablet [id number] Yes Place 1 tablet (0.4 mg total) under the tongue every 5 (five) minutes as needed for chest pain. Vonna Sharlet POUR, MD  Active Self, Pharmacy Records           Med Note RANELLE, KEENE HERO   Tue Jan 03, 2024 12:21 PM)    pantoprazole  (PROTONIX ) 40 MG tablet [id number] Yes TAKE 1 TABLET TWICE DAILY Newlin, Enobong, MD  Active Self, Pharmacy Records  TOUJEO  MAX SOLOSTAR 300 UNIT/ML Solostar Pen [id number] Yes INJECT 50 UNITS INTO THE SKIN DAILY (DOSE DECREASE) Newlin, Enobong, MD  Active Self, Pharmacy Records  Vitamin D , Cholecalciferol, 25 MCG (1000 UT) TABS [id number] Yes Take 1,000 mcg by mouth daily. [provider]  Active Self, Pharmacy Records            Recommendation:   Continue Current Plan of Care  Follow Up Plan:   Telephone follow-up in 1 week  Andrea Dimes RN,  BSN [employer] [facility]  [facility] RN Care Manager [phone]

## 2024-01-03 NOTE — Progress Notes (Signed)
 ADVANCED HF CLINIC NOTE  PCP: [name], MD Primary Cardiologist: Oneil Parchment, MD HF Cardiologist: Dr. Cherrie  CC: Post Hospital Heart Failure Follow UP   HPI: Deborah Coffey is a 68 y.o. with CAD s/p CABG x1 (LIMA-LAD) 2005, tobacco use, chronic diastolic CHF, anemia, depression, HLD, HTN, DM, CKD stage 3b, prior TIAs, pulmonary HTN (felt due to underlying lung disease/smoking), and morbid obesity.    Echo12/22 EF 65-70% RV moderately dilated with moderate HK. Flattened septum Moderate TR RVSP est 110 mmHG    Chest CT scan 2/22. No PE   R/L cath on 04/16/21: - mLAD 75% LIMA to LAD is widely patent. pRCA 50% - EF 65% - RA 5 PA 79/18 (39) PCW 12  CO 6.4 L/min; CI PVR 4.2  Sleep study 06/2021 only c/w mild OSA AHI 5.5   Echo 11/24 EF 60-65%, IVC flattened in systole and diastole, c/w RV pressure and volume overload.  RV severely enlarged w/ severely reduced systolic function. The estimated RVSP is 92.5 mmHg (comparred to 111 mmHg previous study). RA severely dilated. Moderate TR    PFTs 12/24: FEV1 1.27 (50%) FVC 1.84 (55),ratio 77, DLCOcor 48. Moderately severe restriction with reversibility (12% change) and severe diffusion defect. She is being followed by [facility] (Dr. Brenna). He has also being following her for pulmonary nodules.   Echo 11/24 EF 60-65% RV mod reduced RVSP 92   RHC 3/25 demonstrated severe PAH w/ cor pulmonale, suspect Group 1 and possibly Group 3. She was started on Sildenafil  20 tid.  RA = 4, RV = 77/6, PA =  78/19 (42), PCW = 6, Fick CO/CI = 3.9/1.9, Thermo CO/CI = 3.6/1.7, PVR = 9.3 (Fick) 10.0 (TD), Ao sat = 96%, PA sat = 57%, 58%, PAPi = 14.8]  10/2023 Placed on Opsynvi  Admitted with 11/2023 A/C HFpEF. Echo LVEF 60-65%, septal flattening in systole and diastole, RV severely enlarged and moderately HK, RVSP 133 mmHg, RA severe dilated, severe TR, dilated IVC. Diuresed with IV lasix  and transitioned to lasix  60 mg twice a day. Digoxin   added. At discharge Opsynvi restarted.   Seen 2 weeks ago and lasix  increased to 80 bid Had labs yesterday with PCP and Scr 1.7 -> 2.1. Has seen Dr. Annella. Pending CT   Here for f/u. Says she doesn't have as much energy and feels more dizzy on new medicine. SOB with mild exertion. I just don't feel like myself .Chronic DOE but able to do ADLs. No edema. No syncope.     Past Medical History:  Diagnosis Date   Allergy    Anemia    CAD in native artery 02/18/2004   Mid LAD lesoin just after Large D1 --> CABG x 1 LIMA-LAD   CAP (community acquired pneumonia) 08/07/2014   CHF (congestive heart failure) (HCC)    Chronic kidney disease, stage 3b (HCC)    Depression    High cholesterol    History of blood transfusion 04/20/2003   related to OHS    Hypertension    Lung nodules    Mild carotid artery disease (HCC)    Pulmonary hypertension (HCC)    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 competitive flow   TIA (transient ischemic attack)    Type II diabetes mellitus (HCC)    Current Outpatient Medications  Medication Sig Dispense Refill   acetaminophen  (TYLENOL ) 500 MG tablet Take 1,000 mg by mouth every 6 (six) hours  as needed for headache or moderate pain (pain score 4-6).     Alcohol Swabs (B-D SINGLE USE SWABS REGULAR) PADS USE AS DIRECTED 100 each 0   aspirin  EC 81 MG tablet Take 81 mg by mouth daily.     atorvastatin  (LIPITOR) 10 MG tablet TAKE 1 TABLET ONE TIME DAILY 90 tablet 1   Blood Glucose Monitoring Suppl (ONETOUCH VERIO) w/Device KIT Use as instructed to test blood glucose 3 times daily. 1 kit 0   clotrimazole  (LOTRIMIN ) 1 % cream Apply 1 Application topically 2 (two) times daily. 85 g 1   cyanocobalamin  (VITAMIN B12) 1000 MCG tablet Take 1,000 mcg by mouth daily.     dapagliflozin  propanediol (FARXIGA ) 10 MG TABS tablet TAKE 1 TABLET EVERY DAY BEFORE BREAKFAST 90 tablet 1   diclofenac  Sodium (VOLTAREN ) 1 % GEL  Apply 2 g topically daily as needed (muscle pain). 150 g 1   digoxin  (LANOXIN ) 0.125 MG tablet Take 1 tablet (0.125 mg total) by mouth daily. 30 tablet 0   furosemide  (LASIX ) 40 MG tablet Take 2 tablets (80 mg total) by mouth 2 (two) times daily. 180 tablet 1   gabapentin  (NEURONTIN ) 300 MG capsule Take 300 mg by mouth 2 (two) times daily.     glucose blood (ONETOUCH VERIO) test strip Use as instructed to test blood glucose 3 times daily. 100 each 3   Insulin  Pen Needle (DROPLET PEN NEEDLES) 31G X 8 MM MISC USE WITH TOUJEO  SOLOSTAR PEN EVERY DAY 100 each 0   Lancets (ONETOUCH DELICA PLUS LANCET33G) MISC Use as instructed to test blood glucose 3 times daily. 100 each 3   Macitentan -Tadalafil  (OPSYNVI) 10-40 MG TABS Take 1 tablet by mouth in the morning.     metFORMIN  (GLUCOPHAGE ) 1000 MG tablet Take 1,000 mg by mouth daily.     Multiple Vitamin (MULTIVITAMIN WITH MINERALS) TABS tablet Take 1 tablet by mouth daily.     nitroGLYCERIN  (NITROSTAT ) 0.4 MG SL tablet Place 1 tablet (0.4 mg total) under the tongue every 5 (five) minutes as needed for chest pain. 25 tablet 0   pantoprazole  (PROTONIX ) 40 MG tablet TAKE 1 TABLET TWICE DAILY 180 tablet 1   TOUJEO  MAX SOLOSTAR 300 UNIT/ML Solostar Pen INJECT 50 UNITS INTO THE SKIN DAILY (DOSE DECREASE) 18 mL 3   Vitamin D , Cholecalciferol, 25 MCG (1000 UT) TABS Take 1,000 mcg by mouth daily.     Continuous Glucose Receiver (DEXCOM G7 RECEIVER) DEVI Use to check blood glucose continuously. (Patient not taking: Reported on 01/03/2024) 1 each 0   Continuous Glucose Sensor (DEXCOM G7 SENSOR) MISC Use to check blood glucose throughout the day. Changes sensors once every 10 days. (Patient not taking: Reported on 01/03/2024) 3 each 6   No current facility-administered medications for this encounter.   Allergies  Allergen Reactions   Ciprofloxacin Rash   Social History   Socioeconomic History   Marital status: Divorced    Spouse name: Not on file   Number of  children: Not on file   Years of education: 12   Highest education level: Some college, no degree  Occupational History   Occupation: Disabled  Tobacco Use   Smoking status: Every Day    Current packs/day: 0.25    Average packs/day: 0.5 packs/day for 50.7 years (25.2 ttl pk-yrs)    Types: Cigarettes    Start date: 72   Smokeless tobacco: Current   Tobacco comments:    10 Cigarettes a day. 08/22/2023  Vaping Use  Vaping status: Some Days   Substances: Flavoring  Substance and Sexual Activity   Alcohol use: Not Currently   Drug use: No    Types: Marijuana    Comment: smoked pot years ago but not currently   Sexual activity: Not Currently  Other Topics Concern   Not on file  Social History Narrative   Lives with daughter and 3 grandchildren   Social Drivers of Health   Financial Resource Strain: Low Risk  (08/23/2023)   Overall Financial Resource Strain (CARDIA)    Difficulty of Paying Living Expenses: Not hard at all  Food Insecurity: No Food Insecurity (12/16/2023)   Hunger Vital Sign    Worried About Running Out of Food in the Last Year: Never true    Ran Out of Food in the Last Year: Never true  Transportation Needs: No Transportation Needs (12/16/2023)   PRAPARE - Administrator, Civil Service (Medical): No    Lack of Transportation (Non-Medical): No  Physical Activity: Inactive (08/23/2023)   Exercise Vital Sign    Days of Exercise per Week: 0 days    Minutes of Exercise per Session: 0 min  Stress: No Stress Concern Present (08/23/2023)   [employer] of Occupational Health - Occupational Stress Questionnaire    Feeling of Stress : Not at all  Social Connections: Unknown (12/12/2023)   Social Connection and Isolation Panel    Frequency of Communication with Friends and Family: More than three times a week    Frequency of Social Gatherings with Friends and Family: More than three times a week    Attends Religious Services: Not on file    Active Member of  Clubs or Organizations: Not on file    Attends Banker Meetings: Not on file    Marital Status: Not on file  Intimate Partner Violence: Not At Risk (12/16/2023)   Humiliation, Afraid, Rape, and Kick questionnaire    Fear of Current or Ex-Partner: No    Emotionally Abused: No    Physically Abused: No    Sexually Abused: No   Family History  Problem Relation Age of Onset   Heart disease Mother    Diabetes Mother    Cancer Mother        cervical   Diabetes Father    Diabetes Sister    Heart disease Sister    Lupus Sister    Heart failure Sister    Hepatitis C Sister    Hypertension Daughter    Diabetes Brother    Heart attack Brother    Heart failure Brother    Diabetes Brother    Colon cancer Neg Hx    Breast cancer Neg Hx    BP 130/60   Pulse (!) 102   Wt 101.4 kg (223 lb 9.6 oz)   LMP  (LMP Unknown)   SpO2 95%   BMI 38.38 kg/m   Wt Readings from Last 3 Encounters:  01/03/24 101.4 kg (223 lb 9.6 oz)  01/02/24 101.5 kg (223 lb 12.8 oz)  12/28/23 101.2 kg (223 lb)   PHYSICAL EXAM: General:Sitting on exam table. No resp difficulty HEENT: normal Neck: supple. JVP 8-10 Carotids 2+ bilat; no bruits. No lymphadenopathy or thryomegaly appreciated. Cor: PMI nondisplaced. Regular rate & rhythm. No rubs, gallops or murmurs. Lungs: clear Abdomen: soft, nontender, nondistended. No hepatosplenomegaly. No bruits or masses. Good bowel sounds. Extremities: no cyanosis, clubbing, rash, tr edema Neuro: alert & orientedx3, cranial nerves grossly intact. moves all 4 extremities w/o  difficulty. Affect pleasant   ASSESSMENT & PLAN:  1. Pulmonary HTN with RV failure/cor pulmonale - Echo (12/22): EF 65-70% RV moderately dilated with moderate HK, flattened septum, moderate TR, RVSP est 110 mmHG   - Chest CT scan (2/22): No PE - RHC (12/22): RA 5 PA 79/18 (39) PCW 12  CO 6.4 L/min; CI PVR 4.2 - PFTs with significant restrictive/obstructive lung disease - ANA, ANCA, [place]-70,  RF all negative. - Sleep study with mild OSA.  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 She was started on Sildenafil  20 tid. She was later transitioned to Opsynvi - Echo 11/2023- LVEF 60-65%, septal flattening in systole and diastole, RV severely enlarged and moderately HK, RVSP 133 mmHg, RA severe dilated, severe TR, dilated IVC - Persistent NYHA III symptoms. Now with more dizziness/orthostasis.Will decrease lasix  to 60 bid - Continue Opsynvi  - Continue Farxiga  10 mg daily. - Previous sleep study with just mild OSA. Follows with Pulmonary - She continues to struggle. PA pressures remain very high on echo. Will assess response to change in diuretics and plan repeat RHC at next visit with likely addition of Sotatercept if she is agreeable  2. CAD - CABG 2005 LIMA-> LAD - Cath (04/16/21): mLAD 75% LIMA to LAD is widely patent. pRCA 50 - No s/s angina - Continue ASA/statin.  3. Morbid obesity - Body mass index is 38.38 kg/m.. - On ozempic  - Discussed weight loss as an improtant part of treatment  4. OSA - Mild on sleep study. Not on CPAP - Has follow up with Dr Annella.   5. HTN - Blood pressure well controlled. Continue current regimen.  6. Smoking - Discussed need for smoking cessation  Toribio Fuel, MD  12:29 PM

## 2024-01-03 NOTE — Patient Instructions (Signed)
 Visit Information  Thank you for taking time to visit with me today. Please don't hesitate to contact me if I can be of assistance to you before our next scheduled telephone appointment.   Following is a copy of your care plan:   Goals Addressed             This Visit's Progress    [facility] Transitions of Care (TOC) Care Plan       Problems:  Recent Hospitalization for treatment of CHF and anemia Knowledge Deficit Related to Dexcom CGM, SDOH barrier: Housing instability, and managing mental health concerns  Goal:  Over the next 30 days, the patient will not experience hospital readmission  Interventions:  Transitions of Care: Doctor Visits  - discussed the importance of doctor visits Post discharge activity limitations prescribed by provider reviewed Medication reviewed, discussed needing a physical address for Opsynvi delivery  Reviewed heart healthy, low sodium diet Referral to [facility] pharmacy team for CGM-Dexcom ordered, has not been delivered Referral to [facility] for housing insecurity-has been scheduled, will be moving to new housing in 2 weeks Referral to [facility]I LCSW for managing mental health concerns, feelings of depression and anxiety-scheduled Reviewed upcoming appointments including: 01/05/24 for CT, 01/06/24 with BSW and 01/10/24 with LCSW Collaboration with Pharmacist/Luke requesting Dexcom order to be sent to [facility] on [address]  Heart Failure Interventions: Discussed importance of daily weight and advised patient to weigh and record daily Discussed the importance of keeping all appointments with provider Provided patient with education about the role of exercise in the management of heart failure Reviewed provider notes from Cardiology visit, discussed Lasix  dose change  Reviewed daily weights and when to call her provider   Patient Self Care Activities:  Attend all scheduled provider appointments Call pharmacy for medication refills 3-7 days in advance of running out of  medications Call provider office for new concerns or questions  Notify RN Care Manager of TOC call rescheduling needs Participate in Transition of Care Program/Attend TOC scheduled calls Take medications as prescribed   call office if I gain more than 2 pounds in one day or 5 pounds in one week track weight in diary use salt in moderation watch for swelling in feet, ankles and legs every day weigh myself daily  Plan:  Telephone follow up appointment with care management team member scheduled for:  01/10/24 at 3pm        Patient verbalizes understanding of instructions and care plan provided today and agrees to view in MyChart. Active MyChart status and patient understanding of how to access instructions and care plan via MyChart confirmed with patient.     Telephone follow up appointment with care management team member scheduled for:01/10/24 at 3pm  Please call the care guide team at [phone] if you need to cancel or reschedule your appointment.   Please call [phone]-TALK (toll free, 24 hour hotline) go to [facility] [address] [place] [phone]) call 911 if you are experiencing a Mental Health or Behavioral Health Crisis or need someone to talk to.  Andrea Dimes RN, BSN [place]  [facility] Health RN Care Manager [phone]

## 2024-01-03 NOTE — Patient Instructions (Signed)
 Medication Changes:  DECREASE Furosemide  to 60 mg (1 & 1/2 tabs) Twice daily   Special Instructions // Education:  Do the following things EVERYDAY: Weigh yourself in the morning before breakfast. Write it down and keep it in a log. Take your medicines as prescribed Eat low salt foods--Limit salt (sodium) to 2000 mg per day.  Stay as active as you can everyday Limit all fluids for the day to less than 2 liters   Follow-Up in: 1 month   At the [facility], you and your health needs are our priority. We have a designated team specialized in the treatment of Heart Failure. This Care Team includes your primary Heart Failure Specialized Cardiologist (physician), Advanced Practice Providers (APPs- Physician Assistants and Nurse Practitioners), and Pharmacist who all work together to provide you with the care you need, when you need it.   You may see any of the following providers on your designated Care Team at your next follow up:  Dr. Toribio Fuel Dr. Ezra Shuck Dr. Ria Commander Dr. Odis Brownie Greig Mosses, NP Caffie Shed, [place] [facility] [place], [place] Beckey Coe, NP [place] Lee, NP Tinnie Redman, PharmD   Please be sure to bring in all your medications bottles to every appointment.   Need to Contact Us :  If you have any questions or concerns before your next appointment please send us  a message through [place] or call our office at [phone].    TO LEAVE A MESSAGE FOR THE NURSE SELECT OPTION 2, PLEASE LEAVE A MESSAGE INCLUDING: YOUR NAME DATE OF BIRTH CALL BACK NUMBER REASON FOR CALL**this is important as we prioritize the call backs  YOU WILL RECEIVE A CALL BACK THE SAME DAY AS LONG AS YOU CALL BEFORE 4:00 PM

## 2024-01-03 NOTE — Telephone Encounter (Signed)
 Can you please give her a call to inform her she needs to call her [facility] pharmacy to authorize delivery of her G7 Dexcom.  Looks instructions as prescription was sent 2 weeks ago.  Thank you.

## 2024-01-04 NOTE — Telephone Encounter (Signed)
 LVM for patient informing her that she needs to reach out to the pharmacy to schedule delivery.

## 2024-01-05 ENCOUNTER — Ambulatory Visit (HOSPITAL_COMMUNITY)
Admission: RE | Admit: 2024-01-05 | Discharge: 2024-01-05 | Disposition: A | Discharge status: Home / Self Care | Source: Ambulatory Visit | Attending: Pulmonary Disease | Admitting: Pulmonary Disease

## 2024-01-05 ENCOUNTER — Ambulatory Visit: Payer: Self-pay | Admitting: Family Medicine

## 2024-01-05 DIAGNOSIS — J439 Emphysema, unspecified: Secondary | ICD-10-CM | POA: Diagnosis not present

## 2024-01-05 DIAGNOSIS — Z122 Encounter for screening for malignant neoplasm of respiratory organs: Secondary | ICD-10-CM | POA: Insufficient documentation

## 2024-01-05 DIAGNOSIS — I700 Atherosclerosis of aorta: Secondary | ICD-10-CM | POA: Diagnosis not present

## 2024-01-05 DIAGNOSIS — D3501 Benign neoplasm of right adrenal gland: Secondary | ICD-10-CM | POA: Diagnosis not present

## 2024-01-05 DIAGNOSIS — F17210 Nicotine dependence, cigarettes, uncomplicated: Secondary | ICD-10-CM | POA: Diagnosis not present

## 2024-01-05 DIAGNOSIS — N184 Chronic kidney disease, stage 4 (severe): Secondary | ICD-10-CM

## 2024-01-05 LAB — CBC WITH DIFFERENTIAL/PLATELET
Basophils Absolute: 0 x10E3/uL (ref 0.0–0.2)
Basos: 0 %
EOS (ABSOLUTE): 0.1 x10E3/uL (ref 0.0–0.4)
Eos: 2 %
Hematocrit: 30.3 % — ABNORMAL LOW (ref 34.0–46.6)
Hemoglobin: 9.3 g/dL — ABNORMAL LOW (ref 11.1–15.9)
Immature Grans (Abs): 0 x10E3/uL (ref 0.0–0.1)
Immature Granulocytes: 0 %
Lymphocytes Absolute: 1.6 x10E3/uL (ref 0.7–3.1)
Lymphs: 20 %
MCH: 28.2 pg (ref 26.6–33.0)
MCHC: 30.7 g/dL — ABNORMAL LOW (ref 31.5–35.7)
MCV: 92 fL (ref 79–97)
Monocytes Absolute: 0.6 x10E3/uL (ref 0.1–0.9)
Monocytes: 7 %
Neutrophils Absolute: 5.7 x10E3/uL (ref 1.4–7.0)
Neutrophils: 70 %
Platelets: 325 x10E3/uL (ref 150–450)
RBC: 3.3 x10E6/uL — ABNORMAL LOW (ref 3.77–5.28)
RDW: 14.3 % (ref 11.7–15.4)
WBC: 8.1 x10E3/uL (ref 3.4–10.8)

## 2024-01-05 LAB — MICROALBUMIN / CREATININE URINE RATIO
Creatinine, Urine: 44.2 mg/dL
Microalb/Creat Ratio: 8 mg/g{creat} (ref 0–29)
Microalbumin, Urine: 3.6 ug/mL

## 2024-01-05 LAB — CMP14+EGFR
ALT: 13 IU/L (ref 0–32)
AST: 20 IU/L (ref 0–40)
Albumin: 4.4 g/dL (ref 3.9–4.9)
Alkaline Phosphatase: 115 IU/L (ref 51–125)
BUN/Creatinine Ratio: 18 (ref 12–28)
BUN: 39 mg/dL — ABNORMAL HIGH (ref 8–27)
Bilirubin Total: <0.2 mg/dL (ref 0.0–1.2)
CO2: 24 mmol/L (ref 20–29)
Calcium: 9.9 mg/dL (ref 8.7–10.3)
Chloride: 99 mmol/L (ref 96–106)
Creatinine, Ser: 2.11 mg/dL — ABNORMAL HIGH (ref 0.57–1.00)
Globulin, Total: 3 g/dL (ref 1.5–4.5)
Glucose: 105 mg/dL — ABNORMAL HIGH (ref 70–99)
Potassium: 4.6 mmol/L (ref 3.5–5.2)
Sodium: 138 mmol/L (ref 134–144)
Total Protein: 7.4 g/dL (ref 6.0–8.5)
eGFR: 25 mL/min/1.73 — ABNORMAL LOW (ref >59)

## 2024-01-06 ENCOUNTER — Other Ambulatory Visit: Payer: Self-pay

## 2024-01-06 ENCOUNTER — Other Ambulatory Visit: Payer: Self-pay | Admitting: Family Medicine

## 2024-01-06 MED ORDER — BUPROPION HCL ER (SR) 150 MG PO TB12
ORAL_TABLET | ORAL | 2 refills | Status: DC
Start: 2024-01-06 — End: 2024-02-15

## 2024-01-06 NOTE — Patient Outreach (Signed)
 Complex Care Management   Visit Note  01/06/2024  Name:  Deborah Coffey MRN: [id number] DOB: [birth year]  Situation: Referral received for Complex Care Management related to SDOH Barriers:  Housing finding housing I obtained verbal consent from Patient.  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 competitive flow   TIA (transient ischemic attack)    Type II diabetes mellitus (HCC)     Assessment: BSW held initial call with pt. Pt was alert and cognitive. SDOH needs were assessed and the following needs were identified: housing. Pt confirms she has been living at a hotel, [facility] on [address], for 1 month with her adult daughter and two grandchildren. Pt reports she gets social security income and daughter is actively working. Pt reports issues with obtaining a home since daughter was evicted in July 2025. Pt reports she has never had a eviction herself and BSW encouraged her to apply for rental homes/apartments. BSW will provide pt mailed housing resources and pt will call back to provide daughters email for a digital copy of resources. Pt also confirmed no immediate food insecurity at this time, but would like information on food pantries and out of the garden distribution schedule. BSW will provide both via mail and email. Pt states she believes she needs glasses or get her eyes checked out. Pt  sees a provider and BSW encouraged pt to reach out to schedule an appt. Transportation is not an issue at this time. No other resources were provided at this time.   SDOH Interventions    Flowsheet Row Patient Outreach Telephone from 01/06/2024 in [place] POPULATION HEALTH DEPARTMENT Telephone from 12/16/2023 in [place] POPULATION HEALTH DEPARTMENT Clinical Support from 08/23/2023 in [facility] [place] - A Dept Of [place]. [facility] Office Visit from 02/02/20[facility] in [facility] [place] - A Dept Of [place]. [facility] Clinical Support from 08/19/2022 in [facility] [place] - A Dept Of [place]. [facility] INTENSIVE CARDIAC REHAB ORIENT from 02/04/2022 in [facility]  SDOH Interventions        Food Insecurity Interventions Intervention Not Indicated, Community Resources Provided  [patient does not receive FNS. Daughter recieves FNS for children.] Intervention Not Indicated Intervention Not Indicated -- Intervention Not Indicated --  Housing Interventions Community Resources Provided Other (Comment)  [referral to [employer] SW] Intervention Not Indicated -- Intervention Not Indicated --  Transportation Interventions Patient Resources (Friends/Family)  [daughter has transportation and provides transportation. Meds are delivered at home and [facility] for immediate prescriptions on [address].] Intervention Not Indicated Intervention Not Indicated -- Intervention Not Indicated --  Utilities Interventions Intervention Not Indicated Intervention Not Indicated Intervention  Not Indicated Intervention Not Indicated -- --  Alcohol Usage Interventions -- -- Intervention Not Indicated (Score <7) Intervention Not Indicated (Score <7) -- --  Depression Interventions/Treatment  -- --  [Referral to LCSW] PHQ2-9 Score <4 Follow-up Not Indicated -- -- Patient refuses Treatment  Financial  Strain Interventions -- -- Intervention Not Indicated -- Intervention Not Indicated --  Physical Activity Interventions -- -- Intervention Not Indicated -- Patient Refused, Other (Comments) --  Stress Interventions -- -- Intervention Not Indicated -- Intervention Not Indicated --  Social Connections Interventions -- -- Intervention Not Indicated -- -- --  Health Literacy Interventions -- -- Intervention Not Indicated Intervention Not Indicated -- --      Recommendation:   Review food and housing resources. Use search tool to find housing.   Follow Up Plan:   Telephone follow up appointment date/time:  01/20/2024 at 11:30AM.  Laymon Doll, BSW [place]/[facility] - [place] Social Worker [phone]

## 2024-01-06 NOTE — Patient Outreach (Signed)
 Complex Care Management   Visit Note  01/06/2024  Name:  Deborah Coffey MRN: [id number] DOB: Aug 11, 1955  Situation: Referral received for Complex Care Management related to SDOH Barriers:  Housing find housing I obtained verbal consent from Patient.  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 competitive flow   TIA (transient ischemic attack)    Type II diabetes mellitus (HCC)     Assessment: BSW held initial outreach appt with pt. Pt was alert and cognitive. SDOH needs were assessed and the following needs were identified: housing. Pt confirmed she is currently living in a hotel, quality inn off of [address], for 1 month. Pt states she lives with her adult daughter and two grandchildren. Pt is looking for housing and is on fixed income. Daughter is currently working. BSW will provide resources for food and housing via mail and email. Pt instructed resources be sent to daughter email. No other resources were provided/requested at this time. Pt was provided with BSW phone # and reviewed upcoming appt together.   SDOH Interventions    Flowsheet Row Patient Outreach Telephone from 01/06/2024 in [place] POPULATION HEALTH DEPARTMENT Telephone from 12/16/2023 in [place] POPULATION HEALTH DEPARTMENT Clinical Support from 08/23/2023 in [facility] Health Comm Health [employer] - A Dept Of [place]. [facility] Office Visit from 02/02/2023 in [facility] Health Comm Health [place] - A Dept Of [place]. [facility] Clinical Support from 08/19/2022 in [facility] - A Dept Of [place]ol[place] DEL. [facility] INTENSIVE CARDIAC REHAB ORIENT from 02/04/2022 in [facility] for Heart, Vascular, & Lung Health  SDOH Interventions        Food Insecurity Interventions Intervention Not Indicated, Community Resources Provided  [patient does not receive FNS. Daughter recieves FNS for children.] Intervention Not Indicated Intervention Not Indicated -- Intervention Not Indicated --  Housing Interventions Community Resources Provided Other (Comment)  [referral to [employer] SW] Intervention Not Indicated -- Intervention Not Indicated --  Transportation Interventions Patient Resources (Friends/Family)  [daughter has transportation and provides transportation. Meds are delivered at home and uses [facility] for immediate prescriptions on [address].] Intervention Not Indicated Intervention Not Indicated -- Intervention Not Indicated --  Utilities Interventions Intervention Not Indicated Intervention Not Indicated Intervention Not Indicated Intervention Not Indicated -- --  Alcohol Usage Interventions -- -- Intervention Not Indicated (Score <7) Intervention Not Indicated (Score <7) -- --  Depression Interventions/Treatment  -- --  [Referral to LCSW] PHQ2-9 Score <4 Follow-up Not Indicated -- -- Patient refuses Treatment  Financial Strain Interventions -- -- Intervention Not Indicated -- Intervention Not Indicated --  Physical Activity Interventions -- -- Intervention Not Indicated -- Patient Refused, Other (Comments) --  Stress Interventions -- -- Intervention Not  Indicated -- Intervention Not Indicated --  Social Connections Interventions -- -- Intervention Not Indicated -- -- --  Health Literacy Interventions -- -- Intervention Not Indicated Intervention Not Indicated -- --       Recommendation:   Review housing resources and food.   Follow Up Plan:   Telephone follow up appointment date/time:  01/20/2024 at 11:30AM.   Laymon Doll, BSW [place]/VBCI - [facility] Social Worker [phone]

## 2024-01-06 NOTE — Patient Instructions (Signed)
 Visit Information  Thank you for taking time to visit with me today. Please don't hesitate to contact me if I can be of assistance to you before our next scheduled appointment.  Our next appointment is by telephone on 01/20/2024 at 11:30AM Please call the care guide team at [phone] if you need to cancel or reschedule your appointment.   Following is a copy of your care plan:   Goals Addressed             This Visit's Progress    BSW VBCI Social Work Care Plan       Problems:   Housing   CSW Clinical Goal(s):   Over the next 2 weeks the Patient will work with Child psychotherapist to address concerns related to housing.  Interventions:  Provide pt housing resources and food resources for future use.   Patient Goals/Self-Care Activities:  Follow up with out of the garden food project distribution schedule for community food options. Review housing resources and search tool.  Plan:   Telephone follow up appointment with care management team member scheduled for:  01/20/2024 at 11:30AM.        Please call the Suicide and Crisis Lifeline: 988 go to [facility] [address] [place] [phone]) call 911 if you are experiencing a Mental Health or Behavioral Health Crisis or need someone to talk to.  Patient verbalizes understanding of instructions and care plan provided today and agrees to view in MyChart. Active MyChart status and patient understanding of how to access instructions and care plan via MyChart confirmed with patient.     Laymon Doll, BSW [place]/VBCI - [employer] Social Worker [phone]

## 2024-01-10 ENCOUNTER — Telehealth: Payer: Self-pay | Admitting: *Deleted

## 2024-01-10 ENCOUNTER — Other Ambulatory Visit: Payer: Self-pay

## 2024-01-10 ENCOUNTER — Encounter: Payer: Self-pay | Admitting: *Deleted

## 2024-01-11 ENCOUNTER — Encounter: Payer: Self-pay | Admitting: Licensed Clinical Social Worker

## 2024-01-11 ENCOUNTER — Telehealth: Payer: Self-pay | Admitting: Licensed Clinical Social Worker

## 2024-01-11 ENCOUNTER — Encounter: Payer: Self-pay | Admitting: *Deleted

## 2024-01-11 NOTE — Telephone Encounter (Signed)
 This is a CHF pt

## 2024-01-11 NOTE — Patient Instructions (Signed)
 Othel Piety - I am sorry I was unable to reach you today for our scheduled appointment. I work with Newlin, Enobong, MD and am calling to support your healthcare needs. Please contact me at [phone] at your earliest convenience. I look forward to speaking with you soon.   Thank you,  Rolin Kerns, LCSW [place]  [facility] Clinical Social Worker Direct Dial : [phone]  Fax: [phone] Website: [email or web address] 10:28 AM

## 2024-01-12 ENCOUNTER — Encounter: Payer: Self-pay | Admitting: *Deleted

## 2024-01-12 ENCOUNTER — Other Ambulatory Visit: Payer: Self-pay

## 2024-01-12 NOTE — Telephone Encounter (Signed)
 Pt's mail order pharmacy [facility] is requesting pt's medication digoxin  be sent to their pharmacy. Getting request again. Please address

## 2024-01-15 ENCOUNTER — Ambulatory Visit: Payer: Self-pay | Admitting: Pulmonary Disease

## 2024-01-18 ENCOUNTER — Telehealth: Payer: Self-pay | Admitting: *Deleted

## 2024-01-18 NOTE — Transitions of Care (Post Inpatient/ED Visit) (Signed)
   01/18/2024  Name: Deborah Coffey MRN: [id number] DOB: 18-Aug-1955  RNCM returning call to [name]. RNCM explained that the [facility] 30-dauy Program was closed due to unable to maintain contact on week 4. Patient apologized and [place]. She also missed a call with Jasmine, LCSW. RNCM provided LCSW contact information and offered referral to CCM RNCM. Patient declined [facility] referral, but would like to connect with LCSW. RNCM advised patient to contact her PCP for any new needs or barriers to managing her health.  Andrea Dimes RN, BSN [place]  [facility] Health RN Care Manager [phone]

## 2024-01-20 ENCOUNTER — Other Ambulatory Visit: Payer: Self-pay

## 2024-01-20 NOTE — Patient Instructions (Signed)

## 2024-01-20 NOTE — Patient Outreach (Signed)
 Complex Care Management   Visit Note  01/20/2024  Name:  Deborah Coffey MRN: [id number] DOB: [birth year]  Situation: Referral received for Complex Care Management related to SDOH Barriers:  Housing finding housing I obtained verbal consent from Patient.  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 competitive flow   TIA (transient ischemic attack)    Type II diabetes mellitus (HCC)     Assessment: BSW held f/u appt with pt. Pt was alert and cognitive. Pt confirmed she received resources mailed out to her PO Box and to her daughters email. Pt did not have any questions about resources. Pt stated she did not need additional resources or follow up appts but was agreeable to schedule appt with Rolin Kerns LCSW. BSW informed pt he would be dropping from care team but could always be looped in through [place] should she have future needs. Pt understood and agreed. No other resources were provided/requested at this time.   SDOH Interventions    Flowsheet Row Patient Outreach Telephone from 01/06/2024 in [place] POPULATION HEALTH DEPARTMENT Telephone from 12/16/2023 in [place] POPULATION HEALTH DEPARTMENT Clinical Support from 08/23/2023 in [facility] Health Comm Health [place] - A Dept Of [place]. [facility] Office Visit from 02/02/2023 in [facility] Health Comm Health [place] - A Dept Of [place]. [facility] Clinical Support from 08/19/2022 in [facility] - A Dept Of [place]. [facility] INTENSIVE CARDIAC REHAB ORIENT from 02/04/2022 in [facility] for Heart, Vascular, & Lung Health  SDOH Interventions        Food Insecurity Interventions Intervention Not Indicated, Community Resources Provided  [patient does not receive FNS. Daughter recieves FNS for children.] Intervention Not Indicated Intervention Not Indicated -- Intervention Not Indicated --  Housing Interventions Community Resources Provided Other (Comment)  [referral to [employer] SW] Intervention Not Indicated -- Intervention Not Indicated --  Transportation Interventions Patient Resources (Friends/Family)  [daughter has transportation and provides transportation. Meds are delivered at home and uses [facility] for immediate prescriptions on [address].] Intervention Not Indicated Intervention Not Indicated -- Intervention Not Indicated --  Utilities Interventions Intervention Not Indicated Intervention Not Indicated Intervention Not Indicated Intervention Not Indicated -- --  Alcohol Usage Interventions -- -- Intervention Not Indicated (Score <7) Intervention Not Indicated (Score <7) -- --  Depression Interventions/Treatment  -- --  [Referral to LCSW] PHQ2-9 Score <4 Follow-up Not Indicated -- -- Patient refuses Treatment  Financial Strain Interventions -- -- Intervention Not Indicated -- Intervention Not Indicated --  Physical Activity Interventions -- -- Intervention Not Indicated -- Patient Refused, Other (Comments) --  Stress Interventions -- -- Intervention Not Indicated -- Intervention Not Indicated --  Social Connections Interventions -- -- Intervention Not Indicated -- -- --  Health Literacy Interventions -- -- Intervention Not Indicated Intervention Not Indicated -- --      Recommendation:   Attend LCSW appt scheduled for 02/09/2024 at 10AM   Follow Up Plan:   No  further follow up appts required.   Laymon Doll, BSW [place]/VBCI - [employer] Social Worker [phone]

## 2024-01-28 ENCOUNTER — Observation Stay (HOSPITAL_COMMUNITY)
Admission: EM | Admit: 2024-01-28 | Discharge: 2024-01-31 | Disposition: A | Attending: Family Medicine | Admitting: Family Medicine

## 2024-01-28 ENCOUNTER — Encounter (HOSPITAL_COMMUNITY): Payer: Self-pay

## 2024-01-28 ENCOUNTER — Emergency Department (HOSPITAL_COMMUNITY)

## 2024-01-28 ENCOUNTER — Other Ambulatory Visit: Payer: Self-pay

## 2024-01-28 DIAGNOSIS — I2510 Atherosclerotic heart disease of native coronary artery without angina pectoris: Secondary | ICD-10-CM | POA: Diagnosis not present

## 2024-01-28 DIAGNOSIS — D631 Anemia in chronic kidney disease: Secondary | ICD-10-CM | POA: Diagnosis not present

## 2024-01-28 DIAGNOSIS — N1831 Chronic kidney disease, stage 3a: Secondary | ICD-10-CM | POA: Insufficient documentation

## 2024-01-28 DIAGNOSIS — F17210 Nicotine dependence, cigarettes, uncomplicated: Secondary | ICD-10-CM | POA: Diagnosis not present

## 2024-01-28 DIAGNOSIS — I10 Essential (primary) hypertension: Secondary | ICD-10-CM | POA: Diagnosis present

## 2024-01-28 DIAGNOSIS — J439 Emphysema, unspecified: Secondary | ICD-10-CM | POA: Diagnosis not present

## 2024-01-28 DIAGNOSIS — I959 Hypotension, unspecified: Secondary | ICD-10-CM

## 2024-01-28 DIAGNOSIS — I130 Hypertensive heart and chronic kidney disease with heart failure and stage 1 through stage 4 chronic kidney disease, or unspecified chronic kidney disease: Secondary | ICD-10-CM | POA: Diagnosis not present

## 2024-01-28 DIAGNOSIS — D649 Anemia, unspecified: Secondary | ICD-10-CM | POA: Diagnosis present

## 2024-01-28 DIAGNOSIS — Z794 Long term (current) use of insulin: Secondary | ICD-10-CM | POA: Insufficient documentation

## 2024-01-28 DIAGNOSIS — F32A Depression, unspecified: Secondary | ICD-10-CM | POA: Insufficient documentation

## 2024-01-28 DIAGNOSIS — R7989 Other specified abnormal findings of blood chemistry: Secondary | ICD-10-CM

## 2024-01-28 DIAGNOSIS — F17220 Nicotine dependence, chewing tobacco, uncomplicated: Secondary | ICD-10-CM | POA: Insufficient documentation

## 2024-01-28 DIAGNOSIS — D3501 Benign neoplasm of right adrenal gland: Secondary | ICD-10-CM | POA: Diagnosis not present

## 2024-01-28 DIAGNOSIS — R079 Chest pain, unspecified: Secondary | ICD-10-CM | POA: Diagnosis not present

## 2024-01-28 DIAGNOSIS — Z959 Presence of cardiac and vascular implant and graft, unspecified: Secondary | ICD-10-CM | POA: Diagnosis not present

## 2024-01-28 DIAGNOSIS — E785 Hyperlipidemia, unspecified: Secondary | ICD-10-CM | POA: Diagnosis present

## 2024-01-28 DIAGNOSIS — Z8679 Personal history of other diseases of the circulatory system: Secondary | ICD-10-CM

## 2024-01-28 DIAGNOSIS — R42 Dizziness and giddiness: Secondary | ICD-10-CM

## 2024-01-28 DIAGNOSIS — I5032 Chronic diastolic (congestive) heart failure: Secondary | ICD-10-CM | POA: Diagnosis present

## 2024-01-28 DIAGNOSIS — I259 Chronic ischemic heart disease, unspecified: Secondary | ICD-10-CM | POA: Diagnosis not present

## 2024-01-28 DIAGNOSIS — I700 Atherosclerosis of aorta: Secondary | ICD-10-CM | POA: Insufficient documentation

## 2024-01-28 DIAGNOSIS — R55 Syncope and collapse: Secondary | ICD-10-CM | POA: Insufficient documentation

## 2024-01-28 DIAGNOSIS — N1832 Chronic kidney disease, stage 3b: Secondary | ICD-10-CM | POA: Insufficient documentation

## 2024-01-28 DIAGNOSIS — E109 Type 1 diabetes mellitus without complications: Secondary | ICD-10-CM | POA: Insufficient documentation

## 2024-01-28 DIAGNOSIS — I272 Pulmonary hypertension, unspecified: Secondary | ICD-10-CM | POA: Diagnosis present

## 2024-01-28 DIAGNOSIS — Z79899 Other long term (current) drug therapy: Secondary | ICD-10-CM | POA: Insufficient documentation

## 2024-01-28 DIAGNOSIS — N183 Chronic kidney disease, stage 3 unspecified: Secondary | ICD-10-CM | POA: Insufficient documentation

## 2024-01-28 DIAGNOSIS — N281 Cyst of kidney, acquired: Secondary | ICD-10-CM | POA: Diagnosis not present

## 2024-01-28 DIAGNOSIS — R0789 Other chest pain: Secondary | ICD-10-CM | POA: Diagnosis present

## 2024-01-28 DIAGNOSIS — E1022 Type 1 diabetes mellitus with diabetic chronic kidney disease: Secondary | ICD-10-CM | POA: Insufficient documentation

## 2024-01-28 DIAGNOSIS — E1042 Type 1 diabetes mellitus with diabetic polyneuropathy: Secondary | ICD-10-CM | POA: Diagnosis not present

## 2024-01-28 DIAGNOSIS — Z7982 Long term (current) use of aspirin: Secondary | ICD-10-CM | POA: Insufficient documentation

## 2024-01-28 DIAGNOSIS — Z8659 Personal history of other mental and behavioral disorders: Secondary | ICD-10-CM

## 2024-01-28 DIAGNOSIS — K5730 Diverticulosis of large intestine without perforation or abscess without bleeding: Secondary | ICD-10-CM | POA: Diagnosis not present

## 2024-01-28 DIAGNOSIS — E872 Acidosis, unspecified: Secondary | ICD-10-CM

## 2024-01-28 LAB — COMPREHENSIVE METABOLIC PANEL WITH GFR
ALT: 17 U/L (ref 0–44)
AST: 51 U/L — ABNORMAL HIGH (ref 15–41)
Albumin: 2.9 g/dL — ABNORMAL LOW (ref 3.5–5.0)
Alkaline Phosphatase: 95 U/L (ref 38–126)
Anion gap: 13 (ref 5–15)
BUN: 31 mg/dL — ABNORMAL HIGH (ref 8–23)
CO2: 21 mmol/L — ABNORMAL LOW (ref 22–32)
Calcium: 8.5 mg/dL — ABNORMAL LOW (ref 8.9–10.3)
Chloride: 104 mmol/L (ref 98–111)
Creatinine, Ser: 1.79 mg/dL — ABNORMAL HIGH (ref 0.44–1.00)
GFR, Estimated: 31 mL/min — ABNORMAL LOW (ref >60)
Glucose, Bld: 129 mg/dL — ABNORMAL HIGH (ref 70–99)
Potassium: 4.6 mmol/L (ref 3.5–5.1)
Sodium: 138 mmol/L (ref 135–145)
Total Bilirubin: 0.6 mg/dL (ref 0.0–1.2)
Total Protein: 6.2 g/dL — ABNORMAL LOW (ref 6.5–8.1)

## 2024-01-28 LAB — CBC WITH DIFFERENTIAL/PLATELET
Abs Immature Granulocytes: 0.02 K/uL (ref 0.00–0.07)
Basophils Absolute: 0 K/uL (ref 0.0–0.1)
Basophils Relative: 0 %
Eosinophils Absolute: 0.1 K/uL (ref 0.0–0.5)
Eosinophils Relative: 2 %
HCT: 25.8 % — ABNORMAL LOW (ref 36.0–46.0)
Hemoglobin: 7.9 g/dL — ABNORMAL LOW (ref 12.0–15.0)
Immature Granulocytes: 0 %
Lymphocytes Relative: 17 %
Lymphs Abs: 1.3 K/uL (ref 0.7–4.0)
MCH: 26.5 pg (ref 26.0–34.0)
MCHC: 30.6 g/dL (ref 30.0–36.0)
MCV: 86.6 fL (ref 80.0–100.0)
Monocytes Absolute: 0.5 K/uL (ref 0.1–1.0)
Monocytes Relative: 7 %
Neutro Abs: 5.5 K/uL (ref 1.7–7.7)
Neutrophils Relative %: 74 %
Platelets: 337 K/uL (ref 150–400)
RBC: 2.98 MIL/uL — ABNORMAL LOW (ref 3.87–5.11)
RDW: 16 % — ABNORMAL HIGH (ref 11.5–15.5)
WBC: 7.4 K/uL (ref 4.0–10.5)
nRBC: 0 % (ref 0.0–0.2)

## 2024-01-28 LAB — I-STAT CHEM 8, ED
BUN: 41 mg/dL — ABNORMAL HIGH (ref 8–23)
Calcium, Ion: 1.12 mmol/L — ABNORMAL LOW (ref 1.15–1.40)
Chloride: 106 mmol/L (ref 98–111)
Creatinine, Ser: 2 mg/dL — ABNORMAL HIGH (ref 0.44–1.00)
Glucose, Bld: 128 mg/dL — ABNORMAL HIGH (ref 70–99)
HCT: 26 % — ABNORMAL LOW (ref 36.0–46.0)
Hemoglobin: 8.8 g/dL — ABNORMAL LOW (ref 12.0–15.0)
Potassium: 4.6 mmol/L (ref 3.5–5.1)
Sodium: 141 mmol/L (ref 135–145)
TCO2: 27 mmol/L (ref 22–32)

## 2024-01-28 LAB — TROPONIN I (HIGH SENSITIVITY): Troponin I (High Sensitivity): 13 ng/L (ref <18)

## 2024-01-28 LAB — MAGNESIUM: Magnesium: 2.2 mg/dL (ref 1.7–2.4)

## 2024-01-28 LAB — I-STAT CG4 LACTIC ACID, ED: Lactic Acid, Venous: 2 mmol/L (ref 0.5–1.9)

## 2024-01-28 NOTE — ED Triage Notes (Signed)
 PT states she has had chest pain all day in the center, PT was brought in by [facility]. PT refused nitroglycerin  from ems. PT states she is having left hip pain as well. PT is alert and talking PT VSS

## 2024-01-28 NOTE — ED Provider Notes (Signed)
 [place] EMERGENCY DEPARTMENT AT [facility] Provider Note   CSN: [id number] Arrival date & time: 01/28/24  2005     Patient presents with: Chest Pain and Hip Pain   Deborah Coffey is a 68 y.o. female. Patient is a 68 year old female with PMH of CAD s/p CABG (2005, LIMA-LAD), tobacco use, chronic diastolic heart failure, anemia, depression, HLD, HTN, CKD 3B, morbid obesity, pulmonary hypertension, and insulin -dependent diabetes, presenting to the ED with chief complaint of sudden onset impending sensation of doom with accompanying chest and abdominal pain with accompanying nausea and diaphoresis while ambulating in the parking lot of the grocery store, with accompanying nausea, diaphoresis, and presyncopal symptoms.  Patient denies any falls, but reports she was assisted to the ground by her daughter.  Patient endorses mild chest pressure present at this time, however denies radiations.  She further denies headache, vision changes, fevers, chills, cough, congestion, nausea, vomiting, abdominal pain, changes in urination, changes in bowel movements.  Chest Pain Hip Pain Associated symptoms include chest pain.       Prior to Admission medications   Medication Sig Start Date End Date Taking? Authorizing Provider  acetaminophen  (TYLENOL ) 500 MG tablet Take 1,000 mg by mouth every 6 (six) hours as needed for headache or moderate pain (pain score 4-6).    [provider]  Alcohol Swabs (B-D SINGLE USE SWABS REGULAR) PADS USE AS DIRECTED 04/13/17   Jegede, Olugbemiga E, MD  aspirin  EC 81 MG tablet Take 81 mg by mouth daily.    [provider]  atorvastatin  (LIPITOR) 10 MG tablet TAKE 1 TABLET ONE TIME DAILY 10/17/23   Newlin, Enobong, MD  Blood Glucose Monitoring Suppl (ONETOUCH VERIO) w/Device KIT Use as instructed to test blood glucose 3 times daily. 02/05/22   Newlin, Enobong, MD  buPROPion  (WELLBUTRIN  SR) 150 MG 12 hr tablet Take 1 tablet (150 mg total) by  mouth 2 (two) times daily for 3 days, THEN 1 tablet (150 mg total) 2 (two) times daily. For smoking cessation. 01/06/24 02/08/24  Newlin, Enobong, MD  clotrimazole  (LOTRIMIN ) 1 % cream Apply 1 Application topically 2 (two) times daily. Patient not taking: Reported on 01/03/2024 01/02/24   Newlin, Enobong, MD  Continuous Glucose Receiver (DEXCOM G7 RECEIVER) DEVI Use to check blood glucose continuously. 01/03/24   Newlin, Enobong, MD  Continuous Glucose Sensor (DEXCOM G7 SENSOR) MISC Use to check blood glucose throughout the day. Changes sensors once every 10 days. 01/03/24   Newlin, Enobong, MD  cyanocobalamin  (VITAMIN B12) 1000 MCG tablet Take 1,000 mcg by mouth daily.    [provider]  dapagliflozin  propanediol (FARXIGA ) 10 MG TABS tablet TAKE 1 TABLET EVERY DAY BEFORE BREAKFAST 10/17/23   Newlin, Enobong, MD  diclofenac  Sodium (VOLTAREN ) 1 % GEL Apply 2 g topically daily as needed (muscle pain). 02/02/23   Newlin, Enobong, MD  digoxin  (LANOXIN ) 0.125 MG tablet Take 1 tablet (0.125 mg total) by mouth daily. 01/03/24   Bensimhon, Toribio SAUNDERS, MD  furosemide  (LASIX ) 40 MG tablet Take 1.5 tablets (60 mg total) by mouth 2 (two) times daily. 01/03/24   Bensimhon, Toribio SAUNDERS, MD  gabapentin  (NEURONTIN ) 300 MG capsule Take 300 mg by mouth 2 (two) times daily.    [provider]  glucose blood (ONETOUCH VERIO) test strip Use as instructed to test blood glucose 3 times daily. 02/05/22   Newlin, Enobong, MD  Insulin  Pen Needle (DROPLET PEN NEEDLES) 31G X 8 MM MISC USE WITH TOUJEO  SOLOSTAR PEN EVERY DAY 02/02/23  Newlin, Enobong, MD  Lancets (ONETOUCH DELICA PLUS LANCET33G) MISC Use as instructed to test blood glucose 3 times daily. 02/05/22   Newlin, Enobong, MD  Macitentan -Tadalafil  (OPSYNVI) 10-40 MG TABS Take 1 tablet by mouth in the morning.    [provider]  metFORMIN  (GLUCOPHAGE ) 1000 MG tablet Take 1,000 mg by mouth daily.    [provider]  Multiple Vitamin (MULTIVITAMIN  WITH MINERALS) TABS tablet Take 1 tablet by mouth daily.    [provider]  nitroGLYCERIN  (NITROSTAT ) 0.4 MG SL tablet Place 1 tablet (0.4 mg total) under the tongue every 5 (five) minutes as needed for chest pain. 03/03/23   Vonna Sharlet POUR, MD  pantoprazole  (PROTONIX ) 40 MG tablet TAKE 1 TABLET TWICE DAILY 10/17/23   Newlin, Enobong, MD  TOUJEO  MAX SOLOSTAR 300 UNIT/ML Solostar Pen INJECT 50 UNITS INTO THE SKIN DAILY (DOSE DECREASE) 10/17/23   Newlin, Enobong, MD  Vitamin D , Cholecalciferol, 25 MCG (1000 UT) TABS Take 1,000 mcg by mouth daily.    [provider]    Allergies: Ciprofloxacin    Review of Systems  Cardiovascular:  Positive for chest pain.    Updated Vital Signs BP (!) 142/76   Pulse 74   Temp 98 F (36.7 C) (Oral)   Resp 18   Ht 5' 4 (1.626 m)   Wt 102.1 kg   LMP  (LMP Unknown)   SpO2 96%   BMI 38.62 kg/m   Physical Exam Constitutional:      Appearance: She is well-developed.  Eyes:     Extraocular Movements: Extraocular movements intact.     Pupils: Pupils are equal, round, and reactive to light.  Cardiovascular:     Rate and Rhythm: Normal rate and regular rhythm.     Pulses: Normal pulses.     Heart sounds:     Friction rub present.  Pulmonary:     Effort: Pulmonary effort is normal.     Breath sounds: Normal breath sounds.  Abdominal:     General: Abdomen is flat.     Palpations: Abdomen is soft.  Skin:    General: Skin is warm and dry.     Capillary Refill: Capillary refill takes less than 2 seconds.  Neurological:     General: No focal deficit present.     Mental Status: She is alert.     (all labs ordered are listed, but only abnormal results are displayed) Labs Reviewed  CBC WITH DIFFERENTIAL/PLATELET - Abnormal; Notable for the following components:      Result Value   RBC 2.98 (*)    Hemoglobin 7.9 (*)    HCT 25.8 (*)    RDW 16.0 (*)    All other components within normal limits  COMPREHENSIVE METABOLIC PANEL  WITH GFR - Abnormal; Notable for the following components:   CO2 21 (*)    Glucose, Bld 129 (*)    BUN 31 (*)    Creatinine, Ser 1.79 (*)    Calcium  8.5 (*)    Total Protein 6.2 (*)    Albumin 2.9 (*)    AST 51 (*)    GFR, Estimated 31 (*)    All other components within normal limits  I-STAT CHEM 8, ED - Abnormal; Notable for the following components:   BUN 41 (*)    Creatinine, Ser 2.00 (*)    Glucose, Bld 128 (*)    Calcium , Ion 1.12 (*)    Hemoglobin 8.8 (*)    HCT 26.0 (*)  All other components within normal limits  I-STAT CG4 LACTIC ACID, ED - Abnormal; Notable for the following components:   Lactic Acid, Venous 2.0 (*)    All other components within normal limits  MAGNESIUM   I-STAT CG4 LACTIC ACID, ED  TROPONIN I (HIGH SENSITIVITY)  TROPONIN I (HIGH SENSITIVITY)    EKG: EKG Interpretation Date/Time:  Saturday January 28 2024 20:15:01 EDT Ventricular Rate:  75 PR Interval:  185 QRS Duration:  91 QT Interval:  405 QTC Calculation: 453 R Axis:   84  Text Interpretation: Sinus rhythm Multiple ventricular premature complexes Borderline right axis deviation Abnormal T, consider ischemia, anterior leads noise in anterior leads background noise Otherwise no significant change Confirmed by Emil Share [phone]) on 01/28/2024 9:09:26 PM  Radiology: [facility] Chest Portable 1 View Result Date: 01/28/2024 CLINICAL DATA:  Chest pain EXAM: PORTABLE CHEST 1 VIEW COMPARISON:  12/11/2023 FINDINGS: Prior CABG. Heart and mediastinal contours are within normal limits. No focal opacities or effusions. No acute bony abnormality. IMPRESSION: No active disease. Electronically Signed   By: Franky Crease M.D.   On: 01/28/2024 21:26     Procedures   Medications Ordered in the ED - No data to display                                  Medical Decision Making Amount and/or Complexity of Data Reviewed Labs: ordered. Radiology: ordered.   68 year old female with PMH as above presenting to the  ED with chief complaint of transient episode lasting approximately 10 to 15 minutes of impending sensation of doom  I thought I was going to die sudden onset chest/abdominal pain described as pressure and accompanying nausea and diaphoresis, with presyncopal setting times, resulting in patient being assisted to the ground without fall or head trauma.  She reports that the symptoms spontaneously resolved prior to presentation to ED with residual dull/aching chest pain.  On arrival, patient hypertensive 142/76.  Afebrile.  No tachycardia or tachypnea.  Saturating 96% on RA.  GCS 15.  EKG reviewed -EKG reviewed indicative normal sinus rhythm 69 bpm.  T wave inversions V1-V3.  Upon review of prior, 01/03/2024, T WI in V1 and V2.  Initial troponin 13.  With concern for acute aortic pathology, patient notably with symmetric pulses in bilateral upper and lower extremities.  Attention specifically paid to renal function, CMP with creatinine 1.79/eGFR 31.  With eGFR greater than 30 high clinical suspicion/concern for acute aortic pathology given patient's presentation, patient appropriate for contrasted study.  CTA chest abdomen pelvis ordered.  At time of signout, final disposition pending CTA chest abdomen pelvis.  However, anticipate admission in setting of new T wave inversions and known CAD.  Plan of care discussed with oncoming ED team.  Final diagnoses:  Chest pain due to myocardial ischemia, unspecified ischemic chest pain type    ED Discharge Orders     None          Amen Staszak, Elsie, MD 01/28/24 2335    Emil Share, DO 01/29/24 1631

## 2024-01-28 NOTE — ED Provider Notes (Signed)
 Blood pressure (!) 142/76, pulse 74, temperature 98 F (36.7 C), temperature source Oral, resp. rate 18, height 5' 4 (1.626 m), weight 102.1 kg, SpO2 96%.  Assuming care from Dr. Adeline.  In short, Deborah Coffey is a 68 y.o. female with a chief complaint of Chest Pain and Hip Pain .  Refer to the original H&P for additional details.  The current plan of care is to f/u on CTA dissection scan. Patient is a high risk ACS patient with new EKG changes. Would require admit pending CTA.   EKG Interpretation Date/Time:  Saturday January 28 2024 20:15:01 EDT Ventricular Rate:  75 PR Interval:  185 QRS Duration:  91 QT Interval:  405 QTC Calculation: 453 R Axis:   84  Text Interpretation: Sinus rhythm Multiple ventricular premature complexes Borderline right axis deviation Abnormal T, consider ischemia, anterior leads noise in anterior leads background noise Otherwise no significant change Confirmed by Emil Share [phone]) on 01/28/2024 9:09:26 PM       Discussed patient's case with TRH, Dr. Sundil to request admission. Patient and family (if present) updated with plan.   I reviewed all nursing notes, vitals, pertinent old records, EKGs, labs, imaging (as available).     Darra Fonda MATSU, MD 01/29/24 [phone]

## 2024-01-29 ENCOUNTER — Emergency Department (HOSPITAL_COMMUNITY)

## 2024-01-29 ENCOUNTER — Other Ambulatory Visit (HOSPITAL_COMMUNITY)

## 2024-01-29 ENCOUNTER — Encounter (HOSPITAL_COMMUNITY): Payer: Self-pay | Admitting: Internal Medicine

## 2024-01-29 DIAGNOSIS — Z8659 Personal history of other mental and behavioral disorders: Secondary | ICD-10-CM

## 2024-01-29 DIAGNOSIS — R42 Dizziness and giddiness: Secondary | ICD-10-CM | POA: Diagnosis not present

## 2024-01-29 DIAGNOSIS — R0789 Other chest pain: Secondary | ICD-10-CM

## 2024-01-29 DIAGNOSIS — D649 Anemia, unspecified: Secondary | ICD-10-CM

## 2024-01-29 DIAGNOSIS — E861 Hypovolemia: Secondary | ICD-10-CM | POA: Diagnosis not present

## 2024-01-29 DIAGNOSIS — N183 Chronic kidney disease, stage 3 unspecified: Secondary | ICD-10-CM | POA: Insufficient documentation

## 2024-01-29 DIAGNOSIS — I5032 Chronic diastolic (congestive) heart failure: Secondary | ICD-10-CM

## 2024-01-29 DIAGNOSIS — Z8679 Personal history of other diseases of the circulatory system: Secondary | ICD-10-CM | POA: Diagnosis not present

## 2024-01-29 DIAGNOSIS — E7849 Other hyperlipidemia: Secondary | ICD-10-CM

## 2024-01-29 DIAGNOSIS — N281 Cyst of kidney, acquired: Secondary | ICD-10-CM | POA: Diagnosis not present

## 2024-01-29 DIAGNOSIS — R55 Syncope and collapse: Secondary | ICD-10-CM

## 2024-01-29 DIAGNOSIS — E872 Acidosis, unspecified: Secondary | ICD-10-CM

## 2024-01-29 DIAGNOSIS — R079 Chest pain, unspecified: Secondary | ICD-10-CM | POA: Diagnosis not present

## 2024-01-29 DIAGNOSIS — F17210 Nicotine dependence, cigarettes, uncomplicated: Secondary | ICD-10-CM | POA: Insufficient documentation

## 2024-01-29 DIAGNOSIS — R7989 Other specified abnormal findings of blood chemistry: Secondary | ICD-10-CM | POA: Diagnosis not present

## 2024-01-29 DIAGNOSIS — I959 Hypotension, unspecified: Secondary | ICD-10-CM

## 2024-01-29 DIAGNOSIS — D3501 Benign neoplasm of right adrenal gland: Secondary | ICD-10-CM | POA: Diagnosis not present

## 2024-01-29 DIAGNOSIS — K5730 Diverticulosis of large intestine without perforation or abscess without bleeding: Secondary | ICD-10-CM | POA: Diagnosis not present

## 2024-01-29 DIAGNOSIS — I10 Essential (primary) hypertension: Secondary | ICD-10-CM

## 2024-01-29 DIAGNOSIS — I272 Pulmonary hypertension, unspecified: Secondary | ICD-10-CM

## 2024-01-29 DIAGNOSIS — N1832 Chronic kidney disease, stage 3b: Secondary | ICD-10-CM | POA: Insufficient documentation

## 2024-01-29 DIAGNOSIS — E109 Type 1 diabetes mellitus without complications: Secondary | ICD-10-CM | POA: Insufficient documentation

## 2024-01-29 LAB — TROPONIN I (HIGH SENSITIVITY)
Troponin I (High Sensitivity): 15 ng/L (ref <18)
Troponin I (High Sensitivity): 16 ng/L (ref <18)
Troponin I (High Sensitivity): 19 ng/L — ABNORMAL HIGH (ref <18)

## 2024-01-29 LAB — CBG MONITORING, ED
Glucose-Capillary: 145 mg/dL — ABNORMAL HIGH (ref 70–99)
Glucose-Capillary: 49 mg/dL — ABNORMAL LOW (ref 70–99)
Glucose-Capillary: 62 mg/dL — ABNORMAL LOW (ref 70–99)
Glucose-Capillary: 92 mg/dL (ref 70–99)
Glucose-Capillary: 74 mg/dL (ref 70–99)

## 2024-01-29 LAB — BASIC METABOLIC PANEL WITH GFR
Anion gap: 11 (ref 5–15)
BUN: 28 mg/dL — ABNORMAL HIGH (ref 8–23)
CO2: 25 mmol/L (ref 22–32)
Calcium: 8.7 mg/dL — ABNORMAL LOW (ref 8.9–10.3)
Chloride: 104 mmol/L (ref 98–111)
Creatinine, Ser: 1.76 mg/dL — ABNORMAL HIGH (ref 0.44–1.00)
GFR, Estimated: 32 mL/min — ABNORMAL LOW (ref >60)
Glucose, Bld: 124 mg/dL — ABNORMAL HIGH (ref 70–99)
Potassium: 4 mmol/L (ref 3.5–5.1)
Sodium: 140 mmol/L (ref 135–145)

## 2024-01-29 LAB — GLUCOSE, CAPILLARY
Glucose-Capillary: 112 mg/dL — ABNORMAL HIGH (ref 70–99)
Glucose-Capillary: 169 mg/dL — ABNORMAL HIGH (ref 70–99)

## 2024-01-29 LAB — CBC
HCT: 27.4 % — ABNORMAL LOW (ref 36.0–46.0)
Hemoglobin: 8.3 g/dL — ABNORMAL LOW (ref 12.0–15.0)
MCH: 26.1 pg (ref 26.0–34.0)
MCHC: 30.3 g/dL (ref 30.0–36.0)
MCV: 86.2 fL (ref 80.0–100.0)
Platelets: 331 K/uL (ref 150–400)
RBC: 3.18 MIL/uL — ABNORMAL LOW (ref 3.87–5.11)
RDW: 15.9 % — ABNORMAL HIGH (ref 11.5–15.5)
WBC: 6.3 K/uL (ref 4.0–10.5)
nRBC: 0 % (ref 0.0–0.2)

## 2024-01-29 LAB — I-STAT CG4 LACTIC ACID, ED: Lactic Acid, Venous: 1.2 mmol/L (ref 0.5–1.9)

## 2024-01-29 MED ORDER — HEPARIN SODIUM (PORCINE) 5000 UNIT/ML IJ SOLN
5000.0000 [IU] | Freq: Three times a day (TID) | INTRAMUSCULAR | Status: DC
  Administered 2024-01-29 – 2024-01-31 (×7): 5000 [IU] via SUBCUTANEOUS
  Filled 2024-01-29 (×7): qty 1

## 2024-01-29 MED ORDER — GABAPENTIN 300 MG PO CAPS
300.0000 mg | ORAL_CAPSULE | Freq: Two times a day (BID) | ORAL | Status: DC
  Administered 2024-01-29 – 2024-01-31 (×6): 300 mg via ORAL
  Filled 2024-01-29 (×6): qty 1

## 2024-01-29 MED ORDER — IOHEXOL 350 MG/ML SOLN
75.0000 mL | Freq: Once | INTRAVENOUS | Status: AC | PRN
  Administered 2024-01-29: 75 mL via INTRAVENOUS

## 2024-01-29 MED ORDER — BUPROPION HCL ER (SR) 150 MG PO TB12
150.0000 mg | ORAL_TABLET | Freq: Two times a day (BID) | ORAL | Status: DC
  Administered 2024-01-30 – 2024-01-31 (×3): 150 mg via ORAL
  Filled 2024-01-29 (×5): qty 1

## 2024-01-29 MED ORDER — DAPAGLIFLOZIN PROPANEDIOL 10 MG PO TABS
10.0000 mg | ORAL_TABLET | Freq: Every day | ORAL | Status: DC
  Administered 2024-01-29 – 2024-01-31 (×3): 10 mg via ORAL
  Filled 2024-01-29 (×3): qty 1

## 2024-01-29 MED ORDER — ATORVASTATIN CALCIUM 10 MG PO TABS
10.0000 mg | ORAL_TABLET | Freq: Every day | ORAL | Status: DC
  Administered 2024-01-29 – 2024-01-31 (×3): 10 mg via ORAL
  Filled 2024-01-29 (×3): qty 1

## 2024-01-29 MED ORDER — ENOXAPARIN SODIUM 40 MG/0.4ML IJ SOSY
40.0000 mg | PREFILLED_SYRINGE | INTRAMUSCULAR | Status: DC
Start: 2024-01-29 — End: 2024-01-29

## 2024-01-29 MED ORDER — ASPIRIN 81 MG PO TBEC
81.0000 mg | DELAYED_RELEASE_TABLET | Freq: Every day | ORAL | Status: DC
  Administered 2024-01-29 – 2024-01-31 (×3): 81 mg via ORAL
  Filled 2024-01-29 (×3): qty 1

## 2024-01-29 MED ORDER — INSULIN GLARGINE 100 UNIT/ML ~~LOC~~ SOLN
30.0000 [IU] | Freq: Every day | SUBCUTANEOUS | Status: DC
  Administered 2024-01-30 – 2024-01-31 (×2): 30 [IU] via SUBCUTANEOUS
  Filled 2024-01-29 (×3): qty 0.3

## 2024-01-29 MED ORDER — LACTATED RINGERS IV SOLN: INTRAVENOUS | Status: DC

## 2024-01-29 MED ORDER — LACTATED RINGERS IV BOLUS
1000.0000 mL | Freq: Once | INTRAVENOUS | Status: AC
  Administered 2024-01-29: 1000 mL via INTRAVENOUS

## 2024-01-29 MED ORDER — INSULIN ASPART 100 UNIT/ML IJ SOLN
0.0000 [IU] | Freq: Every day | INTRAMUSCULAR | Status: DC
  Administered 2024-01-30: 2 [IU] via SUBCUTANEOUS

## 2024-01-29 MED ORDER — ACETAMINOPHEN 325 MG PO TABS: 650.0000 mg | ORAL_TABLET | Freq: Four times a day (QID) | ORAL | Status: DC | PRN

## 2024-01-29 MED ORDER — INSULIN ASPART 100 UNIT/ML IJ SOLN
0.0000 [IU] | Freq: Three times a day (TID) | INTRAMUSCULAR | Status: DC
  Administered 2024-01-30: 1 [IU] via SUBCUTANEOUS

## 2024-01-29 MED ORDER — DOCUSATE SODIUM 100 MG PO CAPS
100.0000 mg | ORAL_CAPSULE | Freq: Two times a day (BID) | ORAL | Status: DC
  Administered 2024-01-29 – 2024-01-31 (×5): 100 mg via ORAL
  Filled 2024-01-29 (×6): qty 1

## 2024-01-29 MED ORDER — ONDANSETRON HCL 4 MG PO TABS: 4.0000 mg | ORAL_TABLET | Freq: Four times a day (QID) | ORAL | Status: DC | PRN

## 2024-01-29 MED ORDER — PANTOPRAZOLE SODIUM 40 MG PO TBEC
40.0000 mg | DELAYED_RELEASE_TABLET | Freq: Two times a day (BID) | ORAL | Status: DC
  Administered 2024-01-29 – 2024-01-31 (×6): 40 mg via ORAL
  Filled 2024-01-29 (×6): qty 1

## 2024-01-29 MED ORDER — MACITENTAN-TADALAFIL 10-40 MG PO TABS
1.0000 | ORAL_TABLET | Freq: Every morning | ORAL | Status: DC
Start: 2024-01-29 — End: 2024-01-29

## 2024-01-29 MED ORDER — SODIUM CHLORIDE 0.9% FLUSH
3.0000 mL | Freq: Two times a day (BID) | INTRAVENOUS | Status: DC
  Administered 2024-01-29 – 2024-01-31 (×5): 3 mL via INTRAVENOUS

## 2024-01-29 MED ORDER — ONDANSETRON HCL 4 MG/2ML IJ SOLN: 4.0000 mg | Freq: Four times a day (QID) | INTRAMUSCULAR | Status: DC | PRN

## 2024-01-29 MED ORDER — ACETAMINOPHEN 650 MG RE SUPP: 650.0000 mg | Freq: Four times a day (QID) | RECTAL | Status: DC | PRN

## 2024-01-29 NOTE — ED Notes (Signed)
 Floor notified patient coming up

## 2024-01-29 NOTE — Progress Notes (Signed)
 Pt received from ED in stable condition. No c/o of pain or chest discomfort. On RA. BS wnl. Bed in lowest position and call light within reach.

## 2024-01-29 NOTE — ED Notes (Addendum)
 Pt resting comfortably

## 2024-01-29 NOTE — Addendum Note (Signed)
 Encounter addended by: Prayan Ulin R, MD on: 01/29/2024 11:30 AM  Actions taken: Clinical Note Signed, Level of Service modified, Visit diagnoses modified

## 2024-01-29 NOTE — ED Notes (Addendum)
 Pt given 8oz orange juice for CBG 49 per standing order protocol, provider notified.

## 2024-01-29 NOTE — Plan of Care (Signed)
   Problem: Coping: Goal: Ability to adjust to condition or change in health will improve Outcome: Progressing

## 2024-01-29 NOTE — ED Notes (Signed)
 Pt given second 8oz of orange juice for CBG 62.

## 2024-01-29 NOTE — H&P (Addendum)
 History and Physical    Deborah Coffey FMW:[id number] DOB: February 03, 1956 DOA: 01/28/2024  PCP: Newlin, Enobong, MD   Patient coming from: Home   Chief Complaint:  Chief Complaint  Patient presents with   Chest Pain   Hip Pain   ED TRIAGE note:  PT states she has had chest pain all day in the center, PT was brought in by [facility]. PT refused nitroglycerin  from ems. PT states she is having left hip pain as well. PT is alert and talking PT VSS       HPI:  Deborah Coffey is a 68 y.o. female with medical history significant of CAD s/p CABG, chronic diastolic heart failure, hyperlipidemia, essential hypertension, depression, smoking cigarette, insulin -dependent DM type II, CKD stage IIIb, history of TIA, pulmonary hypertension and obstructive sleep apnea presented to emergency department with complaining of sudden onset of impending sensation of doom with and Co. chest and abdominal pain with associated nausea and diaphoresis while ambulating in a parking lot out of grocery store with associated nausea, diaphoresis and presyncopal symptoms.  Patient denies any fall.  Patient endorses chest pressure in the ED denies any radiation.  During my evaluation at the bedside patient is complaining about mid abdominal and lower abdominal discomfort.  Patient denies any chest pain or chest pressure.  Denies any shortness of breath and palpitation.  Patient is endorsing n Tusing feeling generalized weak.  Denies any headache and blurry vision.   ED Course: At presentation to ED patient found hemodynamically stable. Lab work CBC showing low hemoglobin 7.9 (baseline hemoglobin around 9) normal WBC platelet count. CMP showed a bicarb 21, creatinine 1.79 and GFR 31.  Unfortunate baseline.  Normal mag level. Flat troponin 13 and 19. Initial lactic acidosis 2 improved to 1.2. EKG showed sinus rhythm heart rate 69, T wave inversion in V1-V3 leads.  CT angio chest abdomen pelvis: No aneurysmal  dilatation of the aorta or dissection is seen. No pulmonary emboli are noted.  CTA of the abdomen and pelvis: No arterial abnormality is noted.  Diverticulosis without diverticulitis.  Stable right adrenal adenoma.  No other focal abnormality noted.  Chest x-ray unremarkable.  While in the ED patient does not have any chest pain chest/pressure however she is still complaining about abdominal discomfort likely sensation of tightness across her belly.  Given patient has significant cardiac history hospitalist has been consulted for admission for evaluation for presyncope, chest pressure and elevated troponin.  I have discussed with on-call cardiology Dr. Cesario who reviewed the EKG and as compared to previous EKG from September and August patient has similar T wave inversion but this time the T wave inversion is more deeper.  Given patient has flat troponin there is no recommendation for IV heparin  drip recommended to trend troponin and get an echocardiogram.  If echocardiogram shows any wall motion abnormality in that case need to consult cardiology for formal evaluation.   Significant labs in the ED: Lab Orders         CBC with Differential         Comprehensive metabolic panel         Magnesium          CBC         Basic metabolic panel         I-stat chem 8, ED (not at [facility], [facility] or [facility])         I-Stat CG4 Lactic Acid         CBG monitoring, ED  Review of Systems:  Review of Systems  Constitutional:  Negative for chills, fever, malaise/fatigue and weight loss.  Respiratory:  Negative for cough and shortness of breath.   Cardiovascular:  Negative for chest pain, palpitations and leg swelling.  Gastrointestinal:  Negative for abdominal pain, heartburn, nausea and vomiting.       Mid abdominal discomfort  Neurological:  Negative for dizziness and headaches.  Psychiatric/Behavioral:  The patient is not nervous/anxious.     Past Medical History:  Diagnosis Date   Allergy     Anemia    CAD in native artery 02/18/2004   Mid LAD lesoin just after Large D1 --> CABG x 1 LIMA-LAD   CAP (community acquired pneumonia) 08/07/2014   CHF (congestive heart failure) (HCC)    Chronic kidney disease, stage 3b (HCC)    Depression    High cholesterol    History of blood transfusion 04/20/2003   related to [facility]    Hypertension    Lung nodules    Mild carotid artery disease    Pulmonary hypertension (HCC)    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 competitive flow   TIA (transient ischemic attack)    Type II diabetes mellitus (HCC)     Past Surgical History:  Procedure Laterality Date   CARDIAC CATHETERIZATION  2010   Previous LAD 95% lesion - now ~30-40%; patent LIMA with competitive flow   CESAREAN SECTION  1977; 1989   CHOLECYSTECTOMY  ~ 2012   CORONARY ARTERY BYPASS GRAFT  02/2004   CABG X1 (03/22/2013); LIMA-LAD   RIGHT HEART CATH N/A 07/14/2023   Procedure: RIGHT HEART CATH;  Surgeon: Cherrie Toribio SAUNDERS, MD;  Location: [facility] INVASIVE CV LAB;  Service: Cardiovascular;  Laterality: N/A;   RIGHT/LEFT HEART CATH AND CORONARY/GRAFT ANGIOGRAPHY N/A 04/16/2021   Procedure: RIGHT/LEFT HEART CATH AND CORONARY/GRAFT ANGIOGRAPHY;  Surgeon: Dann Candyce RAMAN, MD;  Location: [facility] INVASIVE CV LAB;  Service: Cardiovascular;  Laterality: N/A;   TUBAL LIGATION  1989     reports that she has been smoking cigarettes. She started smoking about 50 years ago. She has a 25.2 pack-year smoking history. She uses smokeless tobacco. She reports that she does not currently use alcohol. She reports that she does not use drugs.  Allergies  Allergen Reactions   Ciprofloxacin Rash    Family History  Problem Relation Age of Onset   Heart disease Mother    Diabetes Mother    Cancer Mother        cervical   Diabetes Father    Diabetes Sister    Heart disease Sister    Lupus Sister    Heart failure Sister    Hepatitis C Sister     Hypertension Daughter    Diabetes Brother    Heart attack Brother    Heart failure Brother    Diabetes Brother    Colon cancer Neg Hx    Breast cancer Neg Hx     Prior to Admission medications   Medication Sig Start Date End Date Taking? Authorizing Provider  acetaminophen  (TYLENOL ) 500 MG tablet Take 1,000 mg by mouth every 6 (six) hours as needed for headache or moderate pain (pain score 4-6).    [provider]  Alcohol Swabs (B-D SINGLE USE SWABS REGULAR) PADS USE AS DIRECTED 04/13/17   Jegede, Olugbemiga E, MD  aspirin  EC 81 MG tablet Take 81 mg by mouth daily.    [provider]  atorvastatin  (LIPITOR) 10 MG tablet TAKE 1 TABLET ONE TIME DAILY 10/17/23   Newlin, Enobong, MD  Blood Glucose Monitoring Suppl (ONETOUCH VERIO) w/Device KIT Use as instructed to test blood glucose 3 times daily. 02/05/22   Newlin, Enobong, MD  buPROPion  (WELLBUTRIN  SR) 150 MG 12 hr tablet Take 1 tablet (150 mg total) by mouth 2 (two) times daily for 3 days, THEN 1 tablet (150 mg total) 2 (two) times daily. For smoking cessation. 01/06/24 02/08/24  [name], MD  clotrimazole  (LOTRIMIN ) 1 % cream Apply 1 Application topically 2 (two) times daily. Patient not taking: Reported on 01/03/2024 01/02/24   Newlin, Enobong, MD  Continuous Glucose Receiver (DEXCOM G7 RECEIVER) DEVI Use to check blood glucose continuously. 01/03/24   Newlin, Enobong, MD  Continuous Glucose Sensor (DEXCOM G7 SENSOR) MISC Use to check blood glucose throughout the day. Changes sensors once every 10 days. 01/03/24   Newlin, Enobong, MD  cyanocobalamin  (VITAMIN B12) 1000 MCG tablet Take 1,000 mcg by mouth daily.    [provider]  dapagliflozin  propanediol (FARXIGA ) 10 MG TABS tablet TAKE 1 TABLET EVERY DAY BEFORE BREAKFAST 10/17/23   Newlin, Enobong, MD  diclofenac  Sodium (VOLTAREN ) 1 % GEL Apply 2 g topically daily as needed (muscle pain). 02/02/23   Newlin, Enobong, MD  digoxin  (LANOXIN ) 0.125 MG tablet Take 1  tablet (0.125 mg total) by mouth daily. 01/03/24   Bensimhon, Toribio SAUNDERS, MD  furosemide  (LASIX ) 40 MG tablet Take 1.5 tablets (60 mg total) by mouth 2 (two) times daily. 01/03/24   Bensimhon, Toribio SAUNDERS, MD  gabapentin  (NEURONTIN ) 300 MG capsule Take 300 mg by mouth 2 (two) times daily.    [provider]  glucose blood (ONETOUCH VERIO) test strip Use as instructed to test blood glucose 3 times daily. 02/05/22   Newlin, Enobong, MD  Insulin  Pen Needle (DROPLET PEN NEEDLES) 31G X 8 MM MISC USE WITH TOUJEO  SOLOSTAR PEN EVERY DAY 02/02/23   Newlin, Enobong, MD  Lancets (ONETOUCH DELICA PLUS LANCET33G) MISC Use as instructed to test blood glucose 3 times daily. 02/05/22   Newlin, Enobong, MD  Macitentan -Tadalafil  (OPSYNVI) 10-40 MG TABS Take 1 tablet by mouth in the morning.    [provider]  metFORMIN  (GLUCOPHAGE ) 1000 MG tablet Take 1,000 mg by mouth daily.    [provider]  Multiple Vitamin (MULTIVITAMIN WITH MINERALS) TABS tablet Take 1 tablet by mouth daily.    [provider]  nitroGLYCERIN  (NITROSTAT ) 0.4 MG SL tablet Place 1 tablet (0.4 mg total) under the tongue every 5 (five) minutes as needed for chest pain. 03/03/23   Vonna Sharlet POUR, MD  pantoprazole  (PROTONIX ) 40 MG tablet TAKE 1 TABLET TWICE DAILY 10/17/23   Newlin, Enobong, MD  TOUJEO  MAX SOLOSTAR 300 UNIT/ML Solostar Pen INJECT 50 UNITS INTO THE SKIN DAILY (DOSE DECREASE) 10/17/23   [name], MD  Vitamin D , Cholecalciferol, 25 MCG (1000 UT) TABS Take 1,000 mcg by mouth daily.    [provider]     Physical Exam: Vitals:   01/28/24 2330 01/28/24 2345 01/29/24 0000 01/29/24 0000  BP:    112/68  Pulse: 72 73 72   Resp: 19 17 14    Temp:    98.2 F (36.8 C)  TempSrc:      SpO2: 93% 93% 96% 98%  Weight:      Height:        Physical Exam Vitals and nursing note reviewed.  Constitutional:      General:  She is not in acute distress.    Appearance: She is not ill-appearing.   Cardiovascular:     Rate and Rhythm: Normal rate and regular rhythm.     Heart sounds: Normal heart sounds.  Pulmonary:     Effort: Pulmonary effort is normal.  Chest:     Chest wall: No tenderness.  Abdominal:     Palpations: Abdomen is soft.  Musculoskeletal:        General: Normal range of motion.  Skin:    General: Skin is dry.     Capillary Refill: Capillary refill takes less than 2 seconds.  Neurological:     Mental Status: She is oriented to person, place, and time.  Psychiatric:        Mood and Affect: Mood normal.      Labs on Admission: I have personally reviewed following labs and imaging studies  CBC: Recent Labs  Lab 01/28/24 2107 01/28/24 2131  WBC 7.4  --   NEUTROABS 5.5  --   HGB 7.9* 8.8*  HCT 25.8* 26.0*  MCV 86.6  --   PLT 337  --    Basic Metabolic Panel: Recent Labs  Lab 01/28/24 2107 01/28/24 2131  NA 138 141  K 4.6 4.6  CL 104 106  CO2 21*  --   GLUCOSE 129* 128*  BUN 31* 41*  CREATININE 1.79* 2.00*  CALCIUM  8.5*  --   MG 2.2  --    GFR: Estimated Creatinine Clearance: 32.2 mL/min (A) (by C-G formula based on SCr of 2 mg/dL (H)). Liver Function Tests: Recent Labs  Lab 01/28/24 2107  AST 51*  ALT 17  ALKPHOS 95  BILITOT 0.6  PROT 6.2*  ALBUMIN 2.9*   No results for input(s): LIPASE, AMYLASE in the last 168 hours. No results for input(s): AMMONIA in the last 168 hours. Coagulation Profile: No results for input(s): INR, PROTIME in the last 168 hours. Cardiac Enzymes: Recent Labs  Lab 01/28/24 2107 01/28/24 2349  TROPONINIHS 13 19*   BNP (last 3 results) Recent Labs    08/15/23 1606 12/11/23 1313 12/22/23 1035  BNP 566.5* 673.3* 140.3*   HbA1C: No results for input(s): HGBA1C in the last 72 hours. CBG: Recent Labs  Lab 01/29/24 0158  GLUCAP 145*   Lipid Profile: No results for input(s): CHOL, HDL, LDLCALC, TRIG, CHOLHDL, LDLDIRECT in the last 72 hours. Thyroid  Function Tests: No  results for input(s): TSH, T4TOTAL, FREET4, T3FREE, THYROIDAB in the last 72 hours. Anemia Panel: No results for input(s): VITAMINB12, FOLATE, FERRITIN, TIBC, IRON, RETICCTPCT in the last 72 hours. Urine analysis:    Component Value Date/Time   COLORURINE YELLOW 04/09/2020 1907   APPEARANCEUR CLEAR 04/09/2020 1907   APPEARANCEUR Clear 07/14/2016 1028   LABSPEC 1.010 04/09/2020 1907   PHURINE 5.0 04/09/2020 1907   GLUCOSEU 50 (A) 04/09/2020 1907   HGBUR NEGATIVE 04/09/2020 1907   BILIRUBINUR NEGATIVE 04/09/2020 1907   BILIRUBINUR Negative 07/14/2016 1028   KETONESUR NEGATIVE 04/09/2020 1907   PROTEINUR NEGATIVE 04/09/2020 1907   UROBILINOGEN 0.2 08/07/2014 1925   NITRITE NEGATIVE 04/09/2020 1907   LEUKOCYTESUR NEGATIVE 04/09/2020 1907    Radiological Exams on Admission: I have personally reviewed images CT Angio Chest/Abd/Pel for Dissection W and/or Wo Contrast Result Date: 01/29/2024 CLINICAL DATA:  Chest and back pain EXAM: CT ANGIOGRAPHY CHEST, ABDOMEN AND PELVIS TECHNIQUE: Non-contrast CT of the chest was initially obtained. Multidetector CT imaging through the chest, abdomen and pelvis was performed using the standard protocol during  bolus administration of intravenous contrast. Multiplanar reconstructed images and MIPs were obtained and reviewed to evaluate the vascular anatomy. RADIATION DOSE REDUCTION: This exam was performed according to the departmental dose-optimization program which includes automated exposure control, adjustment of the mA and/or kV according to patient size and/or use of iterative reconstruction technique. CONTRAST:  75mL OMNIPAQUE  IOHEXOL  350 MG/ML SOLN COMPARISON:  Chest x-ray from earlier in the same day. FINDINGS: CTA CHEST FINDINGS Cardiovascular: Initial precontrast images show no aneurysmal dilatation of the aorta. No hyperdense crescent to suggest acute aortic injury is noted. Post-contrast images show no branching pattern of the  aorta. No dissection is seen. Heart is at the upper limits of normal in size. The pulmonary artery shows no evidence of pulmonary embolism. Mediastinum/Nodes: Thoracic inlet is within normal limits. No hilar or mediastinal adenopathy is noted. The esophagus as visualized is within normal limits. Lungs/Pleura: Lungs are well aerated bilaterally. No focal infiltrate or sizable effusion is noted. Stable blebs are noted on the left. No focal infiltrate is seen. Small subpleural nodule is noted in the right upper lobe stable from the prior exam. Musculoskeletal: No chest wall abnormality. No acute or significant osseous findings. Review of the MIP images confirms the above findings. CTA ABDOMEN AND PELVIS FINDINGS VASCULAR Aorta: Normal caliber aorta without aneurysm, dissection, vasculitis or significant stenosis. Celiac: Patent without evidence of aneurysm, dissection, vasculitis or significant stenosis. SMA: Patent without evidence of aneurysm, dissection, vasculitis or significant stenosis. Renals: Both renal arteries are patent without evidence of aneurysm, dissection, vasculitis, fibromuscular dysplasia or significant stenosis. Dual renal arteries are noted on the right. IMA: Patent without evidence of aneurysm, dissection, vasculitis or significant stenosis. Inflow: Iliacs demonstrate atherosclerotic calcification without aneurysmal dilatation or dissection. Veins: No specific venous abnormality is noted. Review of the MIP images confirms the above findings. NON-VASCULAR Hepatobiliary: No focal liver abnormality is seen. Status post cholecystectomy. No biliary dilatation. Pancreas: Unremarkable. No pancreatic ductal dilatation or surrounding inflammatory changes. Spleen: Normal in size without focal abnormality. Adrenals/Urinary Tract: Left adrenal gland is within normal limits. Right adrenal gland demonstrates stable adrenal nodule dating back to 2018 consistent with a benign adenoma. Kidneys demonstrate no renal  calculi or obstructive changes. Left renal cyst is noted. No follow-up is recommended. The bladder is well distended. Stomach/Bowel: Scattered diverticular change of the colon is noted. No diverticulitis is seen. The appendix is within normal limits. Small bowel and stomach are unremarkable. Lymphatic: No lymphadenopathy is seen. Reproductive: Uterus and bilateral adnexa are unremarkable. Other: No abdominal wall hernia or abnormality. No abdominopelvic ascites. Musculoskeletal: No acute or significant osseous findings. Review of the MIP images confirms the above findings. IMPRESSION: CTA of the chest: No aneurysmal dilatation of the aorta or dissection is seen. No pulmonary emboli are noted. CTA of the abdomen and pelvis: No arterial abnormality is noted. Diverticulosis without diverticulitis. Stable right adrenal adenoma. No other focal abnormality noted. Electronically Signed   By: Oneil Devonshire M.D.   On: 01/29/2024 01:11   DG Chest Portable 1 View Result Date: 01/28/2024 CLINICAL DATA:  Chest pain EXAM: PORTABLE CHEST 1 VIEW COMPARISON:  12/11/2023 FINDINGS: Prior CABG. Heart and mediastinal contours are within normal limits. No focal opacities or effusions. No acute bony abnormality. IMPRESSION: No active disease. Electronically Signed   By: Franky Crease M.D.   On: 01/28/2024 21:26     EKG: My personal interpretation of EKG shows: EKG showed sinus rhythm heart rate 69, T wave inversion in V1-V3 leads.   Assessment/Plan:  Principal Problem:   Chest pressure Active Problems:   Hypotension   Elevated troponin   Postural dizziness with presyncope   Hyperlipidemia   CAD (coronary artery disease) - CABG x 1 (LIMA-LAD)   Essential hypertension   Chronic diastolic CHF (congestive heart failure) (HCC)   Pulmonary hypertension (HCC)   Anemia   History of CAD (coronary artery disease)   History of depression   Continuous dependence on cigarette smoking   Insulin  dependent diabetes mellitus type  IA (HCC)   CKD (chronic kidney disease) stage 3, GFR 30-59 ml/min (HCC)   Type B lactic acidosis    Assessment and Plan: Chest pressure Abdominal pressure Presyncope-secondary to hypotension History of CAD status post CABG Chronic diastolic heart failure Essential hypertension -Presented to emergency department complaining of sudden onset of chest pressure abdominal pressure with associated feeling of impending doom when patient was getting out of the grocery store and going towards her car.  At presentation to ED patient found borderline hypotensive otherwise hemodynamically stable. - Lab work,  CBC showing low hemoglobin 7.9 (baseline hemoglobin around 9) normal WBC platelet count. CMP showed a bicarb 21, creatinine 1.79 and GFR 31.  Unfortunate baseline.  Normal mag level.  Initial lactic acid was 2 improved to 1.2 -Flat troponin 13 and 19. - EKG showed sinus rhythm heart rate 69, T wave inversion in V1-V3 leads. -CT angio chest abdomen pelvis: No aneurysmal dilatation of the aorta or dissection is seen. No pulmonary emboli are noted.  CTA of the abdomen and pelvis: No arterial abnormality is noted.  Diverticulosis without diverticulitis.  Stable right adrenal adenoma.  No other focal abnormality noted. -Chest x-ray unremarkable. -Per chart review patient has chronic T wave inversion in anterior leads V1 to V3.  Right now patient does not have any chest pain or chest pressure however she continues to complain about abdominal discomfort. - CTA chest ruled out aortic dissection. -Concern for chest pressure/abdominal pressure and presyncopal episode in the setting of hypotension. - Elevated troponin in the setting of demand ischemia in the context of hypotension. - Discussed with on-call cardiology recommended to trend troponin and obtain echocardiogram.  At this time there is no indication for heparin  drip. - Checking orthostatic vitals. - Holding Lasix  and  Macitentan -Tadalafil   patient is borderline hypotensive. -Starting maintenance fluid LR 100 cc/h.   -Obtaining echocardiogram. - Continue cardiac monitoring - Continue aspirin  and Lipitor. -If echocardiogram shows any wall motion abnormality consult cardiology   Abdominal discomfort - Patient complaining about abdominal discomfort however CT abdomen pelvis showing diverticulosis without any diverticulitis and no acute intra-abdominal finding.  Chronic diastolic heart failure Essential hypertension Pulmonary arterial hypertension -Low blood pressure systolic is between 102 211 and diastolic in between 51-62.  Checking orthostatic vital.  Holding Lasix . -Pending pharmacy verification of digoxin .  Pulmonary  hypertension - Holding Macitentan -Tadalafil  in the setting of hypotension.  Type B lactic acidosis -Initial lactic acid was 2 improved to 1.2 without any intervention.  Concern for type a lactic acidosis in the setting of metformin  use.  At this time there is a concern for sepsis or cardiogenic shock.  Elevated troponin-secondary demand ischemia -Initial troponin 13 and second troponin is 19.  EKG showing normal sinus rhythm and T wave inversion in V1-V3.  Per chart review patient has T wave inversion of previous EKG however this time to inversion is more deeper as compared to previous EKG. -Patient denies any chest pain or chest pressure.  At this time there is no  concern for acute coronary syndrome - Elevated troponin in the setting of demand ischemia in the context of hypotension.  Continue to trend troponin and obtaining echocardiogram to assess any wall motion abnormality.   History of chronic depression -Continue Wellbutrin   Insulin -dependent DM type II -Continue Lantus  50 units in the morning and sliding scale insulin  with mealtime coverage  Hyperlipidemia -Continue Lipitor   CKD 3A -Creatinine 1.79 and GFR 51.  Renal function at baseline  Peripheral neuropathy - Continue gabapentin  300  mg twice daily  Anemia of chronic disease - Baseline hemoglobin around 9.3-9.7.  Hemoglobin 7.9 today.  Continue to monitor normal MCV  Smoking cigarette? -Unclear if patient is currently smoking or not  DVT prophylaxis:  SQ Heparin  Code Status:  Full Code Diet: Heart/carb modified healthy diet Family Communication:   Family was present at bedside, at the time of interview. Opportunity was given to ask question and all questions were answered satisfactorily.  Disposition Plan: Continue monitor improvement of blood pressure.  Follow-up with troponin and echocardiogram Consults: None indicated at this time. Admission status:   Inpatient, Telemetry bed  Severity of Illness: The appropriate patient status for this patient is INPATIENT. Inpatient status is judged to be reasonable and necessary in order to provide the required intensity of service to ensure the patient's safety. The patient's presenting symptoms, physical exam findings, and initial radiographic and laboratory data in the context of their chronic comorbidities is felt to place them at high risk for further clinical deterioration. Furthermore, it is not anticipated that the patient will be medically stable for discharge from the hospital within 2 midnights of admission.   * I certify that at the point of admission it is my clinical judgment that the patient will require inpatient hospital care spanning beyond 2 midnights from the point of admission due to high intensity of service, high risk for further deterioration and high frequency of surveillance required.DEWAINE    Cameren Odwyer, MD [facility]  How to contact the [facility] Attending or Consulting provider 7A - 7P or covering provider during after hours 7P -7A, for this patient.  Check the care team in [facility] and look for a) attending/consulting [facility] provider listed and b) the [facility] team listed Log into [email or web address] and use [facility]'s universal password to access. If you do not  have the password, please contact the hospital operator. Locate the [facility] provider you are looking for under [facility] and page to a number that you can be directly reached. If you still have difficulty reaching the provider, please page the [facility] (Director on Call) for the Hospitalists listed on amion for assistance.  01/29/2024, 2:14 AM

## 2024-01-29 NOTE — Progress Notes (Signed)
 PROGRESS NOTE    Deborah Coffey  FMW:[id number] DOB: [birth year] DOA: 01/28/2024 PCP: [name], MD   Brief Narrative:  This 67 yrs old female with PMH significant for CAD s/p CABG, Chronic diastolic heart failure, Hyperlipidemia, Essential hypertension, depression, Active Tobacco use , Insulin -dependent DM type II, CKD stage IIIb, history of TIA, pulmonary hypertension and obstructive sleep apnea presented to the ED with c/o: sudden onset of feeling impending doom associated with chest, abdominal pain, nausea and diaphoresis while ambulating in the parking lot out of grocery store.  Patient denies any fall, endorses chest pressure in the ED but denies any radiation.  Workup in the ED shows Hb7.9 ,  troponin slightly elevated 13> 19.  CTA C/A/P showed no aneurysmal dilatation of aorta or dissection is seen.  No evidence of pulmonary embolism.  Patient reports significant improvement in chest pain but continued to complain about abdominal pain.  Patient was evaluated by cardiologist Dr. Josepha, who reviewed the EKG, No new changes as compared  to prior EKG. Patient was admitted for further evaluation.  Assessment & Plan:   Principal Problem:   Chest pressure Active Problems:   Hypotension   Elevated troponin   Postural dizziness with presyncope   Hyperlipidemia   CAD (coronary artery disease) - CABG x 1 (LIMA-LAD)   Essential hypertension   Chronic diastolic CHF (congestive heart failure) (HCC)   Pulmonary hypertension (HCC)   Anemia   History of CAD (coronary artery disease)   History of depression   Continuous dependence on cigarette smoking   Insulin  dependent diabetes mellitus type IA (HCC)   CKD (chronic kidney disease) stage 3, GFR 30-59 ml/min (HCC)   Type B lactic acidosis  Chest /Abdominal discomfort: Presyncope: History of CAD status post CABG: Patient presented to the ED with sudden onset of chest pressure, abdominal discomfort, nausea, feeling of impending doom  in the parking lot out of car . She was borderline hypotensive on arrival in the ED. EKG showed sinus rhythm,  T wave inversion in V1-V3 leads. CT C/A/P ruled out aortic dissection and pulmonary embolism. Continue IV fluid resuscitation. Obtain an echocardiogram. Continue aspirin  and Lipitor. EKG reviewed by cardiologist,  states there is no new changes as compared to prior EKG. Continue to trend troponins if elevated and echo shows abnormal,  consider formal consult.  Abdominal discomfort: CT A/P shows diverticulosis without diverticulitis but no acute intra-abdominal findings. Patient reports slight improvement in abdominal pain.  Elevated troponin: Likely in setting of demand ischemia. Patient denies any chest pain or chest pressure at this time.   No concern for acute coronary syndrome.  Follow-up echocardiogram  Lactic acidosis: Likely in the setting of metformin  use. At this time there is no concern for sepsis. Lactic acidosis resolved with IV hydration.  Chronic depression: Continue Wellbutrin .  Type 2 diabetes Mellitus: Continue Lantus  50 units in the morning and sliding scale.  Hyperlipidemia Continue Lipitor.  CKD stage IIIa: Creatinine 1.79 close to baseline. Avoid nephrotoxic medications.  Peripheral neuropathy Continue gabapentin  300 mg daily.  Anemia of chronic disease: H&H remains 7.9.  Continue to monitor.  Active tobacco abuse. Counseling completed.  DVT prophylaxis: Heparin   Code Status:Full code Family Communication:No family at bed side Disposition Plan:  Status is: Inpatient Remains inpatient appropriate because: Admitted for generalized weakness abdominal and chest discomfort which is improving.   Consultants:  Cardiology  Procedures: Echocardiogram.  Antimicrobials: Anti-infectives (From admission, onward)    None       Subjective: Patient was  seen and examined at bedside.  Overnight events noted. Patient reports feeling better,   still feels weak but improving.  Objective: Vitals:   01/30/24 0124 01/30/24 0500 01/30/24 0741 01/30/24 1126  BP: (!) 112/58 (!) 124/54 (!) 113/58 103/67  Pulse: 74 79 76 72  Resp:   18 16  Temp: 98.7 F (37.1 C) 98.7 F (37.1 C) 98.5 F (36.9 C) 98.3 F (36.8 C)  TempSrc: Oral Oral Oral Oral  SpO2: 96% 92% 98% 96%  Weight:  105.7 kg    Height:        Intake/Output Summary (Last 24 hours) at 01/30/2024 1140 Last data filed at 01/30/2024 1133 Gross per 24 hour  Intake 2235.32 ml  Output --  Net 2235.32 ml   Filed Weights   01/28/24 2007 01/29/24 1835 01/30/24 0500  Weight: 102.1 kg 105.5 kg 105.7 kg    Examination:  General exam: Appears calm and comfortable , not in any acute distress Respiratory system: Clear to auscultation. Respiratory effort normal.  RR 14 Cardiovascular system: S1 & S2 heard, RRR. No JVD, murmurs, rubs, gallops or clicks.  Gastrointestinal system: Abdomen is non distended, soft and non tender. Normal bowel sounds heard. Central nervous system: Alert and oriented x 3. No focal neurological deficits. Extremities: No edema, no cyanosis, no clubbing. Skin: No rashes, lesions or ulcers Psychiatry: Judgement and insight appear normal. Mood & affect appropriate.    Data Reviewed: I have personally reviewed following labs and imaging studies  CBC: Recent Labs  Lab 01/28/24 2107 01/28/24 2131 01/29/24 1900  WBC 7.4  --  6.3  NEUTROABS 5.5  --   --   HGB 7.9* 8.8* 8.3*  HCT 25.8* 26.0* 27.4*  MCV 86.6  --  86.2  PLT 337  --  331   Basic Metabolic Panel: Recent Labs  Lab 01/28/24 2107 01/28/24 2131 01/29/24 0440  NA 138 141 140  K 4.6 4.6 4.0  CL 104 106 104  CO2 21*  --  25  GLUCOSE 129* 128* 124*  BUN 31* 41* 28*  CREATININE 1.79* 2.00* 1.76*  CALCIUM  8.5*  --  8.7*  MG 2.2  --   --    GFR: Estimated Creatinine Clearance: 37.3 mL/min (A) (by C-G formula based on SCr of 1.76 mg/dL (H)). Liver Function Tests: Recent Labs  Lab  01/28/24 2107  AST 51*  ALT 17  ALKPHOS 95  BILITOT 0.6  PROT 6.2*  ALBUMIN 2.9*   No results for input(s): LIPASE, AMYLASE in the last 168 hours. No results for input(s): AMMONIA in the last 168 hours. Coagulation Profile: No results for input(s): INR, PROTIME in the last 168 hours. Cardiac Enzymes: No results for input(s): CKTOTAL, CKMB, CKMBINDEX, TROPONINI in the last 168 hours. BNP (last 3 results) No results for input(s): PROBNP in the last 8760 hours. HbA1C: No results for input(s): HGBA1C in the last 72 hours. CBG: Recent Labs  Lab 01/29/24 1232 01/29/24 1845 01/29/24 2153 01/30/24 0517 01/30/24 1127  GLUCAP 74 112* 169* 122* 135*   Lipid Profile: No results for input(s): CHOL, HDL, LDLCALC, TRIG, CHOLHDL, LDLDIRECT in the last 72 hours. Thyroid  Function Tests: No results for input(s): TSH, T4TOTAL, FREET4, T3FREE, THYROIDAB in the last 72 hours. Anemia Panel: No results for input(s): VITAMINB12, FOLATE, FERRITIN, TIBC, IRON, RETICCTPCT in the last 72 hours. Sepsis Labs: Recent Labs  Lab 01/28/24 2131 01/29/24 0005  LATICACIDVEN 2.0* 1.2    No results found for this or any previous visit (from  the past 240 hours).   Radiology Studies: CT Angio Chest/Abd/Pel for Dissection W and/or Wo Contrast Result Date: 01/29/2024 CLINICAL DATA:  Chest and back pain EXAM: CT ANGIOGRAPHY CHEST, ABDOMEN AND PELVIS TECHNIQUE: Non-contrast CT of the chest was initially obtained. Multidetector CT imaging through the chest, abdomen and pelvis was performed using the standard protocol during bolus administration of intravenous contrast. Multiplanar reconstructed images and MIPs were obtained and reviewed to evaluate the vascular anatomy. RADIATION DOSE REDUCTION: This exam was performed according to the departmental dose-optimization program which includes automated exposure control, adjustment of the mA and/or kV according to  patient size and/or use of iterative reconstruction technique. CONTRAST:  75mL OMNIPAQUE  IOHEXOL  350 MG/ML SOLN COMPARISON:  Chest x-ray from earlier in the same day. FINDINGS: CTA CHEST FINDINGS Cardiovascular: Initial precontrast images show no aneurysmal dilatation of the aorta. No hyperdense crescent to suggest acute aortic injury is noted. Post-contrast images show no branching pattern of the aorta. No dissection is seen. Heart is at the upper limits of normal in size. The pulmonary artery shows no evidence of pulmonary embolism. Mediastinum/Nodes: Thoracic inlet is within normal limits. No hilar or mediastinal adenopathy is noted. The esophagus as visualized is within normal limits. Lungs/Pleura: Lungs are well aerated bilaterally. No focal infiltrate or sizable effusion is noted. Stable blebs are noted on the left. No focal infiltrate is seen. Small subpleural nodule is noted in the right upper lobe stable from the prior exam. Musculoskeletal: No chest wall abnormality. No acute or significant osseous findings. Review of the MIP images confirms the above findings. CTA ABDOMEN AND PELVIS FINDINGS VASCULAR Aorta: Normal caliber aorta without aneurysm, dissection, vasculitis or significant stenosis. Celiac: Patent without evidence of aneurysm, dissection, vasculitis or significant stenosis. SMA: Patent without evidence of aneurysm, dissection, vasculitis or significant stenosis. Renals: Both renal arteries are patent without evidence of aneurysm, dissection, vasculitis, fibromuscular dysplasia or significant stenosis. Dual renal arteries are noted on the right. IMA: Patent without evidence of aneurysm, dissection, vasculitis or significant stenosis. Inflow: Iliacs demonstrate atherosclerotic calcification without aneurysmal dilatation or dissection. Veins: No specific venous abnormality is noted. Review of the MIP images confirms the above findings. NON-VASCULAR Hepatobiliary: No focal liver abnormality is seen.  Status post cholecystectomy. No biliary dilatation. Pancreas: Unremarkable. No pancreatic ductal dilatation or surrounding inflammatory changes. Spleen: Normal in size without focal abnormality. Adrenals/Urinary Tract: Left adrenal gland is within normal limits. Right adrenal gland demonstrates stable adrenal nodule dating back to 2018 consistent with a benign adenoma. Kidneys demonstrate no renal calculi or obstructive changes. Left renal cyst is noted. No follow-up is recommended. The bladder is well distended. Stomach/Bowel: Scattered diverticular change of the colon is noted. No diverticulitis is seen. The appendix is within normal limits. Small bowel and stomach are unremarkable. Lymphatic: No lymphadenopathy is seen. Reproductive: Uterus and bilateral adnexa are unremarkable. Other: No abdominal wall hernia or abnormality. No abdominopelvic ascites. Musculoskeletal: No acute or significant osseous findings. Review of the MIP images confirms the above findings. IMPRESSION: CTA of the chest: No aneurysmal dilatation of the aorta or dissection is seen. No pulmonary emboli are noted. CTA of the abdomen and pelvis: No arterial abnormality is noted. Diverticulosis without diverticulitis. Stable right adrenal adenoma. No other focal abnormality noted. Electronically Signed   By: Oneil Devonshire M.D.   On: 01/29/2024 01:11   DG Chest Portable 1 View Result Date: 01/28/2024 CLINICAL DATA:  Chest pain EXAM: PORTABLE CHEST 1 VIEW COMPARISON:  12/11/2023 FINDINGS: Prior CABG. Heart and mediastinal  contours are within normal limits. No focal opacities or effusions. No acute bony abnormality. IMPRESSION: No active disease. Electronically Signed   By: Franky Crease M.D.   On: 01/28/2024 21:26   Scheduled Meds:  aspirin  EC  81 mg Oral Daily   atorvastatin   10 mg Oral Daily   buPROPion   150 mg Oral BID   dapagliflozin  propanediol  10 mg Oral Daily   docusate sodium   100 mg Oral BID   gabapentin   300 mg Oral BID    heparin  injection (subcutaneous)  5,000 Units Subcutaneous Q8H   insulin  aspart  0-5 Units Subcutaneous QHS   insulin  aspart  0-6 Units Subcutaneous TID WC   insulin  glargine  30 Units Subcutaneous Daily   pantoprazole   40 mg Oral BID   sodium chloride  flush  3 mL Intravenous Q12H   Continuous Infusions:     LOS: 1 day    Time spent: 0 mins    Darcel Dawley, MD [facility]   If 7PM-7AM, please contact night-coverage

## 2024-01-29 NOTE — ED Notes (Addendum)
 Pt BP was 86/48. This paramedic went in to patient room. O2 was reading low at 89% RA. Pt was asleep. Upon arousing, pt BP increased back to 93/51. Pt placed on 2L [place]. Pt states she is not having any pain and was alert and oriented x 4. Aditting MD notified and orders placed. They also advised to continue maintenance LR

## 2024-01-30 ENCOUNTER — Observation Stay (HOSPITAL_COMMUNITY)

## 2024-01-30 DIAGNOSIS — R0789 Other chest pain: Secondary | ICD-10-CM | POA: Diagnosis not present

## 2024-01-30 DIAGNOSIS — R7989 Other specified abnormal findings of blood chemistry: Secondary | ICD-10-CM

## 2024-01-30 DIAGNOSIS — R079 Chest pain, unspecified: Secondary | ICD-10-CM | POA: Diagnosis not present

## 2024-01-30 LAB — GLUCOSE, CAPILLARY
Glucose-Capillary: 122 mg/dL — ABNORMAL HIGH (ref 70–99)
Glucose-Capillary: 135 mg/dL — ABNORMAL HIGH (ref 70–99)
Glucose-Capillary: 126 mg/dL — ABNORMAL HIGH (ref 70–99)
Glucose-Capillary: 201 mg/dL — ABNORMAL HIGH (ref 70–99)

## 2024-01-30 LAB — ECHOCARDIOGRAM LIMITED
Area-P 1/2: 5.84 cm2
Height: 64 in
S' Lateral: 3.1 cm
Weight: 3729.6 [oz_av]

## 2024-01-30 NOTE — Evaluation (Signed)
 Physical Therapy Brief Evaluation and Discharge Note Patient Details Name: Deborah Coffey MRN: [id number] DOB: 03/06/1956 Today's Date: 01/30/2024   History of Present Illness  68 yo F adm 01/28/24 with feeling of pending doom, anemia, Abdominal pain. PMHx: CAD s/p CABG, CHF, HTN, HLD, depression, Tobacco use , T2DM, CKD, TIA, pulmonary HTN, OSA  Clinical Impression  Pt pleasant and reports living with family, driving, uses rollator and is alone for several hours a day. Pt states she believes anxiety from fearing daughter may have lost her job led to feelings PTA. Pt calm, aware and does not require assist for transfers, gait or functional mobility with pt at baseline. Pt educated for controlled descent to surfaces and encouraged to continue walking acutely and daily at home. No further therapy needs with pt aware and in agreement, will sign off.        PT Assessment Patient does not need any further PT services  Assistance Needed at Discharge  PRN    Equipment Recommendations None recommended by PT  Recommendations for Other Services       Precautions/Restrictions Precautions Precautions: Fall Recall of Precautions/Restrictions: Intact Restrictions Weight Bearing Restrictions Per Provider Order: No        Mobility  Bed Mobility       General bed mobility comments: sitting EOB on arrival, chair end of session  Transfers Overall transfer level: Needs assistance   Transfers: Sit to/from Stand Sit to Stand: Supervision           General transfer comment: initial lack of control for descent with 5 repetitions at chair with cues for control and reliant on UB    Ambulation/Gait Ambulation/Gait assistance: Modified independent (Device/Increase time) Gait Distance (Feet): 300 Feet Assistive device: Rolling walker (2 wheels) Gait Pattern/deviations: Step-through pattern, Decreased stride length Gait Speed: Below normal General Gait Details: steasdy gait with  RW  Home Activity Instructions    Stairs            Modified Rankin (Stroke Patients Only)        Balance Overall balance assessment: Modified Independent, Mild deficits observed, not formally tested Sitting-balance support: No upper extremity supported, Feet supported Sitting balance-Leahy Scale: Good     Standing balance support: Bilateral upper extremity supported, Reliant on assistive device for balance, During functional activity Standing balance-Leahy Scale: Poor Standing balance comment: RW          Pertinent Vitals/Pain PT - Brief Vital Signs All Vital Signs Stable: Yes Pain Assessment Pain Assessment: No/denies pain     Home Living Family/patient expects to be discharged to:: Private residence Living Arrangements: Children Available Help at Discharge: Family;Available PRN/intermittently Home Environment: Level entry   Home Equipment: Architectural technologist (4 wheels);Shower seat   Additional Comments: Lives with daughter & 3 grandkids (42, 3, and 50 y/o)    Prior Function Level of Independence: Independent with assistive device(s)      UE/LE Assessment   UE ROM/Strength/Tone/Coordination: WFL    LE ROM/Strength/Tone/Coordination: Generalized weakness      Communication   Communication Communication: No apparent difficulties     Cognition Overall Cognitive Status: Appears within functional limits for tasks assessed/performed       General Comments      Exercises     Assessment/Plan    PT Problem List         PT Visit Diagnosis Other abnormalities of gait and mobility (R26.89)    No Skilled PT All education completed;Patient is modified independent with all activity/mobility  Co-evaluation                AMPAC 6 Clicks Help needed turning from your back to your side while in a flat bed without using bedrails?: None Help needed moving from lying on your back to sitting on the side of a flat bed without using  bedrails?: None Help needed moving to and from a bed to a chair (including a wheelchair)?: None Help needed standing up from a chair using your arms (e.g., wheelchair or bedside chair)?: None Help needed to walk in hospital room?: None Help needed climbing 3-5 steps with a railing? : None 6 Click Score: 24      End of Session   Activity Tolerance: Patient tolerated treatment well Patient left: in chair;with call bell/phone within reach;with nursing/sitter in room Nurse Communication: Mobility status PT Visit Diagnosis: Other abnormalities of gait and mobility (R26.89)     Time: 8770-8752 PT Time Calculation (min) (ACUTE ONLY): 18 min  Charges:   PT Evaluation $PT Eval Low Complexity: 1 Low      Ronnita Paz P, PT Acute Rehabilitation Services Office: [phone]   Lenoard NOVAK Dorlisa Savino  01/30/2024, 12:53 PM

## 2024-01-30 NOTE — Progress Notes (Signed)
 PROGRESS NOTE    Deborah Coffey  FMW:[id number] DOB: [birth year] DOA: 01/28/2024 PCP: [name], MD   Brief Narrative:  This 68 yrs old female with PMH significant for CAD s/p CABG, Chronic diastolic heart failure, Hyperlipidemia, Essential hypertension, depression, Active Tobacco use , Insulin -dependent DM type II, CKD stage IIIb, history of TIA, pulmonary hypertension and obstructive sleep apnea presented to the ED with c/o: sudden onset of feeling impending doom associated with chest, abdominal pain, nausea and diaphoresis while ambulating in the parking lot out of grocery store.  Patient denies any fall, endorses chest pressure in the ED but denies any radiation.  Workup in the ED shows Hb7.9 ,  troponin slightly elevated 13> 19.  CTA C/A/P showed no aneurysmal dilatation of aorta or dissection is seen.  No evidence of pulmonary embolism.  Patient reports significant improvement in chest pain but continued to complain about abdominal pain.  Patient was evaluated by cardiologist Dr. Josepha, who reviewed the EKG, No new changes as compared  to prior EKG. Patient was admitted for further evaluation.  Assessment & Plan:   Principal Problem:   Chest pressure Active Problems:   Hypotension   Elevated troponin   Postural dizziness with presyncope   Hyperlipidemia   CAD (coronary artery disease) - CABG x 1 (LIMA-LAD)   Essential hypertension   Chronic diastolic CHF (congestive heart failure) (HCC)   Pulmonary hypertension (HCC)   Anemia   History of CAD (coronary artery disease)   History of depression   Continuous dependence on cigarette smoking   Insulin  dependent diabetes mellitus type IA (HCC)   CKD (chronic kidney disease) stage 3, GFR 30-59 ml/min (HCC)   Type B lactic acidosis  Chest /Abdominal discomfort: Presyncope: History of CAD status post CABG: Patient presented to the ED with sudden onset of chest pressure, abdominal discomfort, nausea, feeling of impending doom  in the parking lot out of car . She was borderline hypotensive on arrival in the ED. EKG showed sinus rhythm,  T wave inversion in V1-V3 leads. CT C/A/P ruled out aortic dissection and pulmonary embolism. Continue IV fluid resuscitation. Recent echo showed LVEF 55 to 60%, normal LV function. Continue aspirin  and Lipitor. EKG reviewed by cardiologist,  states there is no new changes as compared to prior EKG. Continue to trend troponins if elevated and echo shows abnormal,  consider formal consult. Trop trended down.  Chest pain improved. The description appears like panic attack.  Abdominal discomfort: CT A/P shows diverticulosis without diverticulitis but no acute intra-abdominal findings. Patient reports slight improvement in abdominal pain.  Elevated troponin: Likely in setting of demand ischemia. Patient denies any chest pain or chest pressure at this time.   No concern for acute coronary syndrome.  Recent echo shows LVEF 55 to 60%, normal LV function  Lactic acidosis: Likely in the setting of metformin  use. At this time there is no concern for sepsis. Lactic acidosis resolved with IV hydration.  Chronic depression: Continue Wellbutrin .  Type 2 diabetes Mellitus: Continue Lantus  50 units in the morning and sliding scale.  Hyperlipidemia Continue Lipitor.  CKD stage IIIa: Creatinine 1.79 close to baseline. Avoid nephrotoxic medications.  Peripheral neuropathy Continue gabapentin  300 mg daily.  Anemia of chronic disease: H&H remains 7.9 > 8.8.  Continue to monitor.  Active tobacco abuse. Counseling completed.  DVT prophylaxis: Heparin   Code Status:Full code Family Communication: No family at bed side Disposition Plan:  Status is: Inpatient Remains inpatient appropriate because: Admitted for generalized weakness,  abdominal and  chest discomfort which is improving.   Consultants:  Cardiology  Procedures: Echocardiogram.  Antimicrobials: Anti-infectives (From  admission, onward)    None       Subjective: Patient was seen and examined at bedside.  Overnight events noted. Patient reports feeling better, still reports feeling palpitations, generalized weakness fatigue tiredness  Objective: Vitals:   01/30/24 0124 01/30/24 0500 01/30/24 0741 01/30/24 1126  BP: (!) 112/58 (!) 124/54 (!) 113/58 103/67  Pulse: 74 79 76 72  Resp:   18 16  Temp: 98.7 F (37.1 C) 98.7 F (37.1 C) 98.5 F (36.9 C) 98.3 F (36.8 C)  TempSrc: Oral Oral Oral Oral  SpO2: 96% 92% 98% 96%  Weight:  105.7 kg    Height:        Intake/Output Summary (Last 24 hours) at 01/30/2024 1138 Last data filed at 01/30/2024 1133 Gross per 24 hour  Intake 2235.32 ml  Output --  Net 2235.32 ml   Filed Weights   01/28/24 2007 01/29/24 1835 01/30/24 0500  Weight: 102.1 kg 105.5 kg 105.7 kg    Examination:  General exam: Appears calm and comfortable , not in any acute distress Respiratory system: CTA Bilaterally. Respiratory effort normal.  RR 14 Cardiovascular system: S1 & S2 heard, RRR. No JVD, murmurs, rubs, gallops or clicks.  Gastrointestinal system: Abdomen is non distended, soft and non tender. Normal bowel sounds heard. Central nervous system: Alert and oriented x 3. No focal neurological deficits. Extremities: No edema, no cyanosis, no clubbing. Skin: No rashes, lesions or ulcers Psychiatry: Judgement and insight appear normal. Mood & affect appropriate.    Data Reviewed: I have personally reviewed following labs and imaging studies  CBC: Recent Labs  Lab 01/28/24 2107 01/28/24 2131 01/29/24 1900  WBC 7.4  --  6.3  NEUTROABS 5.5  --   --   HGB 7.9* 8.8* 8.3*  HCT 25.8* 26.0* 27.4*  MCV 86.6  --  86.2  PLT 337  --  331   Basic Metabolic Panel: Recent Labs  Lab 01/28/24 2107 01/28/24 2131 01/29/24 0440  NA 138 141 140  K 4.6 4.6 4.0  CL 104 106 104  CO2 21*  --  25  GLUCOSE 129* 128* 124*  BUN 31* 41* 28*  CREATININE 1.79* 2.00* 1.76*   CALCIUM  8.5*  --  8.7*  MG 2.2  --   --    GFR: Estimated Creatinine Clearance: 37.3 mL/min (A) (by C-G formula based on SCr of 1.76 mg/dL (H)). Liver Function Tests: Recent Labs  Lab 01/28/24 2107  AST 51*  ALT 17  ALKPHOS 95  BILITOT 0.6  PROT 6.2*  ALBUMIN 2.9*   No results for input(s): LIPASE, AMYLASE in the last 168 hours. No results for input(s): AMMONIA in the last 168 hours. Coagulation Profile: No results for input(s): INR, PROTIME in the last 168 hours. Cardiac Enzymes: No results for input(s): CKTOTAL, CKMB, CKMBINDEX, TROPONINI in the last 168 hours. BNP (last 3 results) No results for input(s): PROBNP in the last 8760 hours. HbA1C: No results for input(s): HGBA1C in the last 72 hours. CBG: Recent Labs  Lab 01/29/24 1232 01/29/24 1845 01/29/24 2153 01/30/24 0517 01/30/24 1127  GLUCAP 74 112* 169* 122* 135*   Lipid Profile: No results for input(s): CHOL, HDL, LDLCALC, TRIG, CHOLHDL, LDLDIRECT in the last 72 hours. Thyroid  Function Tests: No results for input(s): TSH, T4TOTAL, FREET4, T3FREE, THYROIDAB in the last 72 hours. Anemia Panel: No results for input(s): VITAMINB12, FOLATE, FERRITIN, TIBC, IRON,  RETICCTPCT in the last 72 hours. Sepsis Labs: Recent Labs  Lab 01/28/24 2131 01/29/24 0005  LATICACIDVEN 2.0* 1.2    No results found for this or any previous visit (from the past 240 hours).   Radiology Studies: CT Angio Chest/Abd/Pel for Dissection W and/or Wo Contrast Result Date: 01/29/2024 CLINICAL DATA:  Chest and back pain EXAM: CT ANGIOGRAPHY CHEST, ABDOMEN AND PELVIS TECHNIQUE: Non-contrast CT of the chest was initially obtained. Multidetector CT imaging through the chest, abdomen and pelvis was performed using the standard protocol during bolus administration of intravenous contrast. Multiplanar reconstructed images and MIPs were obtained and reviewed to evaluate the vascular anatomy.  RADIATION DOSE REDUCTION: This exam was performed according to the departmental dose-optimization program which includes automated exposure control, adjustment of the mA and/or kV according to patient size and/or use of iterative reconstruction technique. CONTRAST:  75mL OMNIPAQUE  IOHEXOL  350 MG/ML SOLN COMPARISON:  Chest x-ray from earlier in the same day. FINDINGS: CTA CHEST FINDINGS Cardiovascular: Initial precontrast images show no aneurysmal dilatation of the aorta. No hyperdense crescent to suggest acute aortic injury is noted. Post-contrast images show no branching pattern of the aorta. No dissection is seen. Heart is at the upper limits of normal in size. The pulmonary artery shows no evidence of pulmonary embolism. Mediastinum/Nodes: Thoracic inlet is within normal limits. No hilar or mediastinal adenopathy is noted. The esophagus as visualized is within normal limits. Lungs/Pleura: Lungs are well aerated bilaterally. No focal infiltrate or sizable effusion is noted. Stable blebs are noted on the left. No focal infiltrate is seen. Small subpleural nodule is noted in the right upper lobe stable from the prior exam. Musculoskeletal: No chest wall abnormality. No acute or significant osseous findings. Review of the MIP images confirms the above findings. CTA ABDOMEN AND PELVIS FINDINGS VASCULAR Aorta: Normal caliber aorta without aneurysm, dissection, vasculitis or significant stenosis. Celiac: Patent without evidence of aneurysm, dissection, vasculitis or significant stenosis. SMA: Patent without evidence of aneurysm, dissection, vasculitis or significant stenosis. Renals: Both renal arteries are patent without evidence of aneurysm, dissection, vasculitis, fibromuscular dysplasia or significant stenosis. Dual renal arteries are noted on the right. IMA: Patent without evidence of aneurysm, dissection, vasculitis or significant stenosis. Inflow: Iliacs demonstrate atherosclerotic calcification without  aneurysmal dilatation or dissection. Veins: No specific venous abnormality is noted. Review of the MIP images confirms the above findings. NON-VASCULAR Hepatobiliary: No focal liver abnormality is seen. Status post cholecystectomy. No biliary dilatation. Pancreas: Unremarkable. No pancreatic ductal dilatation or surrounding inflammatory changes. Spleen: Normal in size without focal abnormality. Adrenals/Urinary Tract: Left adrenal gland is within normal limits. Right adrenal gland demonstrates stable adrenal nodule dating back to 2018 consistent with a benign adenoma. Kidneys demonstrate no renal calculi or obstructive changes. Left renal cyst is noted. No follow-up is recommended. The bladder is well distended. Stomach/Bowel: Scattered diverticular change of the colon is noted. No diverticulitis is seen. The appendix is within normal limits. Small bowel and stomach are unremarkable. Lymphatic: No lymphadenopathy is seen. Reproductive: Uterus and bilateral adnexa are unremarkable. Other: No abdominal wall hernia or abnormality. No abdominopelvic ascites. Musculoskeletal: No acute or significant osseous findings. Review of the MIP images confirms the above findings. IMPRESSION: CTA of the chest: No aneurysmal dilatation of the aorta or dissection is seen. No pulmonary emboli are noted. CTA of the abdomen and pelvis: No arterial abnormality is noted. Diverticulosis without diverticulitis. Stable right adrenal adenoma. No other focal abnormality noted. Electronically Signed   By: Oneil Evelyne HERO.D.  On: 01/29/2024 01:11   DG Chest Portable 1 View Result Date: 01/28/2024 CLINICAL DATA:  Chest pain EXAM: PORTABLE CHEST 1 VIEW COMPARISON:  12/11/2023 FINDINGS: Prior CABG. Heart and mediastinal contours are within normal limits. No focal opacities or effusions. No acute bony abnormality. IMPRESSION: No active disease. Electronically Signed   By: Franky Crease M.D.   On: 01/28/2024 21:26   Scheduled Meds:  aspirin  EC   81 mg Oral Daily   atorvastatin   10 mg Oral Daily   buPROPion   150 mg Oral BID   dapagliflozin  propanediol  10 mg Oral Daily   docusate sodium   100 mg Oral BID   gabapentin   300 mg Oral BID   heparin  injection (subcutaneous)  5,000 Units Subcutaneous Q8H   insulin  aspart  0-5 Units Subcutaneous QHS   insulin  aspart  0-6 Units Subcutaneous TID WC   insulin  glargine  30 Units Subcutaneous Daily   pantoprazole   40 mg Oral BID   sodium chloride  flush  3 mL Intravenous Q12H   Continuous Infusions:     LOS: 1 day    Time spent: 50 mins    Darcel Dawley, MD [facility]   If 7PM-7AM, please contact night-coverage

## 2024-01-30 NOTE — TOC CM/SW Note (Signed)
 Transition of Care [facility]) - Inpatient Brief Assessment   Patient Details  Name: Siyona Coto MRN: [id number] Date of Birth: March 07, 1956  Transition of Care [facility]) CM/SW Contact:    Waddell Barnie Rama, RN Phone Number: 01/30/2024, 2:34 PM   Clinical Narrative: From home with daughter, has PCP and insurance on file, states has no HH services in place at this time , has rollator and bsc at home.  States family member (daughter)  will transport them home at [employer] and family is support system, states gets medications from [facility] on [address] or [address] (home delivery) .  Pta self ambulatory with rollator.   There are no ICM needs identified  at this time.  Please place consult for ICM needs.     Transition of Care Asessment: Insurance and Status: Insurance coverage has been reviewed Patient has primary care physician: Yes Home environment has been reviewed: home with daughter and grand kids Prior level of function:: ambulatory with rollator Prior/Current Home Services: Current home services (rollator, bsc) Social Drivers of Health Review: SDOH reviewed no interventions necessary Readmission risk has been reviewed: Yes Transition of care needs: no transition of care needs at this time

## 2024-01-30 NOTE — Care Management Obs Status (Signed)
 MEDICARE OBSERVATION STATUS NOTIFICATION   Patient Details  Name: Deborah Coffey MRN: [id number] Date of Birth: 05-19-1955   Medicare Observation Status Notification Given:  Yes    Waddell Barnie Rama, RN 01/30/2024, 2:41 PM

## 2024-01-30 NOTE — Plan of Care (Signed)

## 2024-01-30 NOTE — Care Management CC44 (Signed)
 Condition Code 44 Documentation Completed  Patient Details  Name: Julie-Ann Vanmaanen MRN: [id number] Date of Birth: 05-08-1955   Condition Code 44 given:  Yes Patient signature on Condition Code 44 notice:  Yes Documentation of 2 MD's agreement:  Yes Code 44 added to claim:  Yes    Waddell Barnie Rama, RN 01/30/2024, 2:41 PM

## 2024-01-30 NOTE — Progress Notes (Signed)
  Echocardiogram 2D Echocardiogram has been performed.  Koleen KANDICE Popper, RDCS 01/30/2024, 2:24 PM

## 2024-01-31 DIAGNOSIS — R0789 Other chest pain: Secondary | ICD-10-CM | POA: Diagnosis not present

## 2024-01-31 LAB — GLUCOSE, CAPILLARY: Glucose-Capillary: 95 mg/dL (ref 70–99)

## 2024-01-31 NOTE — Discharge Summary (Signed)
 Physician Discharge Summary  Deborah Coffey FMW:[id number] DOB: 02/01/1956 DOA: 01/28/2024  PCP: Newlin, Enobong, MD  Admit date: 01/28/2024  Discharge date: 01/31/2024  Admitted From: Home  Disposition:  Home  Recommendations for Outpatient Follow-up:  Follow up with PCP in 1-2 weeks. Please obtain BMP/CBC in one week Patient was offered antianxiety medication but she refused. Advised to continue current medications as scheduled.  Home Health: None Equipment/Devices:None  Discharge Condition: Stable CODE STATUS:Full code Diet recommendation: Heart Healthy   Brief [facility] Course: This 68 yrs old female with PMH significant for CAD s/p CABG, Chronic diastolic heart failure, Hyperlipidemia, Essential hypertension, depression, Active Tobacco use , Insulin -dependent DM type II, CKD stage IIIb, history of TIA, pulmonary hypertension and obstructive sleep apnea presented to the ED with c/o: sudden onset of feeling impending doom associated with chest, abdominal pain, nausea and diaphoresis while ambulating in the parking lot out of grocery store.  Patient denies any fall, endorses chest pressure in the ED but denies any radiation.  Workup in the ED shows Hb7.9 ,  troponin slightly elevated 13> 19.  CTA C/A/P showed no aneurysmal dilatation of aorta or dissection is seen.  No evidence of pulmonary embolism.  Patient reports significant improvement in chest pain but continued to complain about abdominal pain.  Patient was evaluated by cardiologist Dr. Josepha, who reviewed the EKG, No new changes as compared  to prior EKG. Patient was admitted for further evaluation.  Patient's symptoms are particularly related to the anxiety attack, workup so far unremarkable. Patient has made significant improvement and wants to be discharged.  Patient was advised antianxiety medication but she declined and states she will follow-up with PCP and decide.  Discharge Diagnoses:  Principal Problem:    Chest pressure Active Problems:   Hypotension   Elevated troponin   Postural dizziness with presyncope   Hyperlipidemia   CAD (coronary artery disease) - CABG x 1 (LIMA-LAD)   Essential hypertension   Chronic diastolic CHF (congestive heart failure) (HCC)   Pulmonary hypertension (HCC)   Anemia   History of CAD (coronary artery disease)   History of depression   Continuous dependence on cigarette smoking   Insulin  dependent diabetes mellitus type IA (HCC)   CKD (chronic kidney disease) stage 3, GFR 30-59 ml/min (HCC)   Type B lactic acidosis  Chest /Abdominal discomfort: Presyncope: History of CAD status post CABG: Patient presented to the ED with sudden onset of chest pressure, abdominal discomfort, nausea, feeling of impending doom in the parking lot out of car . She was borderline hypotensive on arrival in the ED. EKG showed sinus rhythm,  T wave inversion in V1-V3 leads. CT C/A/P ruled out aortic dissection and pulmonary embolism. Continue IV fluid resuscitation. Recent echo showed LVEF 55 to 60%, normal LV function. Continue aspirin  and Lipitor. EKG reviewed by cardiologist,  states there is no new changes as compared to prior EKG. Continue to trend troponins if elevated and echo shows abnormal,  consider formal consult. Trop trended down.  Chest pain improved. The description appears like panic attack. She was offered antianxiety medication but she declined.   Abdominal discomfort: CT A/P shows diverticulosis without diverticulitis but no acute intra-abdominal findings. Patient reports slight improvement in abdominal pain.   Elevated troponin: Likely in setting of demand ischemia. Patient denies any chest pain or chest pressure at this time.   No concern for acute coronary syndrome.  Recent echo shows LVEF 55 to 60%, normal LV function   Lactic acidosis: Likely  in the setting of metformin  use. At this time there is no concern for sepsis. Lactic acidosis resolved  with IV hydration.   Chronic depression: Continue Wellbutrin .   Type 2 diabetes Mellitus: Continue Lantus  50 units in the morning and sliding scale.   Hyperlipidemia Continue Lipitor.   CKD stage IIIa: Creatinine 1.79 close to baseline. Avoid nephrotoxic medications.   Peripheral neuropathy Continue gabapentin  300 mg daily.   Anemia of chronic disease: H&H remains 7.9 > 8.8.  Continue to monitor.   Active tobacco abuse. Counseling completed.  Discharge Instructions  Discharge Instructions     Call MD for:  persistant dizziness or light-headedness   Complete by: As directed    Call MD for:  persistant nausea and vomiting   Complete by: As directed    Diet - low sodium heart healthy   Complete by: As directed    Diet Carb Modified   Complete by: As directed    Discharge instructions   Complete by: As directed    Advised to follow-up with primary care physician in 1 week. Advised to continue current medications as scheduled.   Increase activity slowly   Complete by: As directed       Allergies as of 01/31/2024       Reactions   Ciprofloxacin Rash        Medication List     STOP taking these medications    clotrimazole  1 % cream Commonly known as: LOTRIMIN    lisinopril  10 MG tablet Commonly known as: ZESTRIL        TAKE these medications    acetaminophen  500 MG tablet Commonly known as: TYLENOL  Take 1,000 mg by mouth every 6 (six) hours as needed for headache or moderate pain (pain score 4-6).   aspirin  EC 81 MG tablet Take 81 mg by mouth daily.   atorvastatin  10 MG tablet Commonly known as: LIPITOR TAKE 1 TABLET ONE TIME DAILY   B-D SINGLE USE SWABS REGULAR Pads USE AS DIRECTED   buPROPion  150 MG 12 hr tablet Commonly known as: Wellbutrin  SR Take 1 tablet (150 mg total) by mouth 2 (two) times daily for 3 days, THEN 1 tablet (150 mg total) 2 (two) times daily. For smoking cessation. Start taking on: January 06, 2024   cyanocobalamin   1000 MCG tablet Commonly known as: VITAMIN B12 Take 1,000 mcg by mouth daily.   Dexcom G7 Receiver Devi Use to check blood glucose continuously.   Dexcom G7 Sensor Misc Use to check blood glucose throughout the day. Changes sensors once every 10 days.   diclofenac  Sodium 1 % Gel Commonly known as: VOLTAREN  Apply 2 g topically daily as needed (muscle pain).   digoxin  0.125 MG tablet Commonly known as: LANOXIN  Take 1 tablet (0.125 mg total) by mouth daily.   Droplet Pen Needles 31G X 8 MM Misc Generic drug: Insulin  Pen Needle USE WITH TOUJEO  SOLOSTAR PEN EVERY DAY   Farxiga  10 MG Tabs tablet Generic drug: dapagliflozin  propanediol TAKE 1 TABLET EVERY DAY BEFORE BREAKFAST   furosemide  40 MG tablet Commonly known as: LASIX  Take 1.5 tablets (60 mg total) by mouth 2 (two) times daily.   gabapentin  300 MG capsule Commonly known as: NEURONTIN  Take 300 mg by mouth 2 (two) times daily.   metFORMIN  500 MG 24 hr tablet Commonly known as: GLUCOPHAGE -XR Take 1,000 mg by mouth 2 (two) times daily.   multivitamin with minerals Tabs tablet Take 1 tablet by mouth daily.   nitroGLYCERIN  0.4 MG SL tablet Commonly  known as: NITROSTAT  Place 1 tablet (0.4 mg total) under the tongue every 5 (five) minutes as needed for chest pain.   OneTouch Delica Plus Lancet33G Misc Use as instructed to test blood glucose 3 times daily.   OneTouch Verio test strip Generic drug: glucose blood Use as instructed to test blood glucose 3 times daily.   OneTouch Verio w/Device Kit Use as instructed to test blood glucose 3 times daily.   Opsynvi 10-40 MG Tabs Generic drug: Macitentan -Tadalafil  Take 1 tablet by mouth in the morning.   pantoprazole  40 MG tablet Commonly known as: PROTONIX  TAKE 1 TABLET TWICE DAILY   Toujeo  Max SoloStar 300 UNIT/ML Solostar Pen Generic drug: insulin  glargine (2 Unit Dial ) INJECT 50 UNITS INTO THE SKIN DAILY (DOSE DECREASE)   Vitamin D  (Cholecalciferol) 25 MCG (1000  UT) Tabs Take 1,000 mcg by mouth daily.        Follow-up Information     [name], MD Follow up in 1 week(s).   Specialty: Family Medicine Contact information: [address] [place] [address] [place] [address] [phone]                Allergies  Allergen Reactions   Ciprofloxacin Rash    Consultations: None   Procedures/Studies: ECHOCARDIOGRAM LIMITED Result Date: 01/30/2024    ECHOCARDIOGRAM LIMITED REPORT   Patient Name:   Deborah Coffey Date of Exam: 01/30/2024 Medical Rec #:  [id number]          Height:       64.0 in Accession #:    [id number]         Weight:       233.1 lb Date of Birth:  10-26-1955         BSA:          2.088 m Patient Age:    67 years           BP:           103/67 mmHg Patient Gender: F                  HR:           80 bpm. Exam Location:  Inpatient Procedure: Limited Echo, Limited Color Doppler and Cardiac Doppler (Both            Spectral and Color Flow Doppler were utilized during procedure). Indications:    Elevated Troponin  History:        Patient has prior history of Echocardiogram examinations, most                 recent 12/12/2023. CAD, Prior CABG, Pulmonary HTN and TIA; Risk                 Factors:Hypertension, Dyslipidemia, Current Smoker and Diabetes.  Sonographer:    Koleen Popper RDCS Referring Phys: SUBRINA SUNDIL  Sonographer Comments: Patient is obese. IMPRESSIONS  1. Left ventricular ejection fraction, by estimation, is 60 to 65%. The left ventricle has normal function. The left ventricle has no regional wall motion abnormalities. There is the interventricular septum is flattened in systole and diastole, consistent with right ventricular pressure and volume overload.  2. Right ventricular systolic function is moderately reduced. The right ventricular size is moderately enlarged. There is severely elevated pulmonary artery systolic pressure. The estimated right ventricular systolic pressure is 91.2 mmHg.  3. Right atrial  size was mildly dilated.  4. The mitral valve is normal in structure. No evidence of mitral valve  regurgitation. No evidence of mitral stenosis.  5. Tricuspid valve regurgitation is severe.  6. The aortic valve is tricuspid. Aortic valve regurgitation is not visualized. No aortic stenosis is present.  7. The inferior vena cava is dilated in size with >50% respiratory variability, suggesting right atrial pressure of 8 mmHg. Comparison(s): A prior study was performed on 12/12/2023. LVEF remains preserved, RVSP reduced from to , RV function remains moderately reduced, RV size was severely dilated and now moderately dilated, estimated RAP was and now 8 mmHG. FINDINGS  Left Ventricle: Left ventricular ejection fraction, by estimation, is 60 to 65%. The left ventricle has normal function. The left ventricle has no regional wall motion abnormalities. The left ventricular internal cavity size was normal in size. There is  no left ventricular hypertrophy. The interventricular septum is flattened in systole and diastole, consistent with right ventricular pressure and volume overload. Right Ventricle: The right ventricular size is moderately enlarged. No increase in right ventricular wall thickness. Right ventricular systolic function is moderately reduced. There is severely elevated pulmonary artery systolic pressure. The tricuspid regurgitant velocity is 4.56 m/s, and with an assumed right atrial pressure of 8 mmHg, the estimated right ventricular systolic pressure is 91.2 mmHg. Left Atrium: Left atrial size was normal in size. Right Atrium: Right atrial size was mildly dilated. Pericardium: There is no evidence of pericardial effusion. Mitral Valve: The mitral valve is normal in structure. No evidence of mitral valve stenosis. Tricuspid Valve: The tricuspid valve is normal in structure. Tricuspid valve regurgitation is severe. No evidence of tricuspid stenosis. Aortic Valve: The aortic valve is  tricuspid. Aortic valve regurgitation is not visualized. No aortic stenosis is present. Pulmonic Valve: The pulmonic valve was normal in structure. Pulmonic valve regurgitation is not visualized. No evidence of pulmonic stenosis. Aorta: The aortic root is normal in size and structure. Venous: The inferior vena cava is dilated in size with greater than 50% respiratory variability, suggesting right atrial pressure of 8 mmHg. IAS/Shunts: The interatrial septum was not assessed. Additional Comments: Spectral Doppler performed. Color Doppler performed.  LEFT VENTRICLE PLAX 2D LVIDd:         4.50 cm LVIDs:         3.10 cm LV PW:         1.20 cm LV IVS:        1.00 cm LVOT diam:     1.90 cm LV SV:         49 LV SV Index:   24 LVOT Area:     2.84 cm  RIGHT VENTRICLE            IVC RV Basal diam:  4.80 cm    IVC diam: 2.40 cm RV Mid diam:    4.60 cm RV S prime:     7.72 cm/s TAPSE (M-mode): 1.1 cm LEFT ATRIUM           Index        RIGHT ATRIUM           Index LA diam:      2.90 cm 1.39 cm/m   RA Area:     21.50 cm LA Vol (A4C): 27.6 ml 13.22 ml/m  RA Volume:   65.50 ml  31.37 ml/m  AORTIC VALVE LVOT Vmax:   86.50 cm/s LVOT Vmean:  57.100 cm/s LVOT VTI:    0.174 m  AORTA Ao Root diam: 2.80 cm MITRAL VALVE  TRICUSPID VALVE MV Area (PHT): 5.84 cm    TR Peak grad:   83.2 mmHg MV Decel Time: 130 msec    TR Vmax:        456.00 cm/s MV E velocity: 91.70 cm/s MV A velocity: 80.70 cm/s  SHUNTS MV E/A ratio:  1.14        Systemic VTI:  0.17 m                            Systemic Diam: 1.90 cm Sunit Tolia Electronically signed by Madonna Large Signature Date/Time: 01/30/2024/2:44:33 PM    Final    CT Angio Chest/Abd/Pel for Dissection W and/or Wo Contrast Result Date: 01/29/2024 CLINICAL DATA:  Chest and back pain EXAM: CT ANGIOGRAPHY CHEST, ABDOMEN AND PELVIS TECHNIQUE: Non-contrast CT of the chest was initially obtained. Multidetector CT imaging through the chest, abdomen and pelvis was performed using the  standard protocol during bolus administration of intravenous contrast. Multiplanar reconstructed images and MIPs were obtained and reviewed to evaluate the vascular anatomy. RADIATION DOSE REDUCTION: This exam was performed according to the departmental dose-optimization program which includes automated exposure control, adjustment of the mA and/or kV according to patient size and/or use of iterative reconstruction technique. CONTRAST:  75mL OMNIPAQUE  IOHEXOL  350 MG/ML SOLN COMPARISON:  Chest x-ray from earlier in the same day. FINDINGS: CTA CHEST FINDINGS Cardiovascular: Initial precontrast images show no aneurysmal dilatation of the aorta. No hyperdense crescent to suggest acute aortic injury is noted. Post-contrast images show no branching pattern of the aorta. No dissection is seen. Heart is at the upper limits of normal in size. The pulmonary artery shows no evidence of pulmonary embolism. Mediastinum/Nodes: Thoracic inlet is within normal limits. No hilar or mediastinal adenopathy is noted. The esophagus as visualized is within normal limits. Lungs/Pleura: Lungs are well aerated bilaterally. No focal infiltrate or sizable effusion is noted. Stable blebs are noted on the left. No focal infiltrate is seen. Small subpleural nodule is noted in the right upper lobe stable from the prior exam. Musculoskeletal: No chest wall abnormality. No acute or significant osseous findings. Review of the MIP images confirms the above findings. CTA ABDOMEN AND PELVIS FINDINGS VASCULAR Aorta: Normal caliber aorta without aneurysm, dissection, vasculitis or significant stenosis. Celiac: Patent without evidence of aneurysm, dissection, vasculitis or significant stenosis. SMA: Patent without evidence of aneurysm, dissection, vasculitis or significant stenosis. Renals: Both renal arteries are patent without evidence of aneurysm, dissection, vasculitis, fibromuscular dysplasia or significant stenosis. Dual renal arteries are noted on  the right. IMA: Patent without evidence of aneurysm, dissection, vasculitis or significant stenosis. Inflow: Iliacs demonstrate atherosclerotic calcification without aneurysmal dilatation or dissection. Veins: No specific venous abnormality is noted. Review of the MIP images confirms the above findings. NON-VASCULAR Hepatobiliary: No focal liver abnormality is seen. Status post cholecystectomy. No biliary dilatation. Pancreas: Unremarkable. No pancreatic ductal dilatation or surrounding inflammatory changes. Spleen: Normal in size without focal abnormality. Adrenals/Urinary Tract: Left adrenal gland is within normal limits. Right adrenal gland demonstrates stable adrenal nodule dating back to 2018 consistent with a benign adenoma. Kidneys demonstrate no renal calculi or obstructive changes. Left renal cyst is noted. No follow-up is recommended. The bladder is well distended. Stomach/Bowel: Scattered diverticular change of the colon is noted. No diverticulitis is seen. The appendix is within normal limits. Small bowel and stomach are unremarkable. Lymphatic: No lymphadenopathy is seen. Reproductive: Uterus and bilateral adnexa are unremarkable. Other: No abdominal wall hernia or  abnormality. No abdominopelvic ascites. Musculoskeletal: No acute or significant osseous findings. Review of the MIP images confirms the above findings. IMPRESSION: CTA of the chest: No aneurysmal dilatation of the aorta or dissection is seen. No pulmonary emboli are noted. CTA of the abdomen and pelvis: No arterial abnormality is noted. Diverticulosis without diverticulitis. Stable right adrenal adenoma. No other focal abnormality noted. Electronically Signed   By: Oneil Devonshire M.D.   On: 01/29/2024 01:11   DG Chest Portable 1 View Result Date: 01/28/2024 CLINICAL DATA:  Chest pain EXAM: PORTABLE CHEST 1 VIEW COMPARISON:  12/11/2023 FINDINGS: Prior CABG. Heart and mediastinal contours are within normal limits. No focal opacities or  effusions. No acute bony abnormality. IMPRESSION: No active disease. Electronically Signed   By: Franky Crease M.D.   On: 01/28/2024 21:26   CT CHEST LUNG CA SCREEN LOW DOSE W/O CM Result Date: 01/12/2024 CLINICAL DATA:  50 pack-year current smoker EXAM: CT CHEST WITHOUT CONTRAST LOW-DOSE FOR LUNG CANCER SCREENING TECHNIQUE: Multidetector CT imaging of the chest was performed following the standard protocol without IV contrast. RADIATION DOSE REDUCTION: This exam was performed according to the departmental dose-optimization program which includes automated exposure control, adjustment of the mA and/or kV according to patient size and/or use of iterative reconstruction technique. COMPARISON:  01/13/2023 FINDINGS: Cardiovascular: Aortic atherosclerosis. Mild cardiomegaly. Median sternotomy for CABG. Pulmonary artery enlargement, outflow tract 3.2 cm. Mediastinum/Nodes: No mediastinal or hilar adenopathy, given limitations of unenhanced CT. Lungs/Pleura: No pleural fluid. Moderate centrilobular emphysema. Mild motion and patient body habitus degradation throughout. Smoking related respiratory bronchiolitis. Larger pulmonary nodules of maximally 6.1 mm are unchanged. Upper Abdomen: Cholecystectomy. Normal imaged portions of the liver, spleen, stomach, pancreas, left adrenal gland, kidneys. Right adrenal nodule measures 1.6 cm and -11 HU. Musculoskeletal: Midthoracic spondylosis.  Intact sternotomy wires. IMPRESSION: 1. Lung rads 2, benign appearance or behavior. Continue annual screening with low-dose chest CT without contrast in 12 months. 2. Pulmonary artery enlargement suggests pulmonary arterial hypertension. 3. Right adrenal adenoma.  No follow-up indicated. 4. Aortic Atherosclerosis (ICD10-I70.0) and Emphysema (ICD10-J43.9). 5. Mild degradation secondary to motion and patient body habitus. Electronically Signed   By: Rockey Kilts M.D.   On: 01/12/2024 13:47     Subjective: Seen and examined at bedside.   Overnight events noted. Patient reports feeling much improved and wants to be discharged.  Reports resolution of symptoms.  Discharge Exam: Vitals:   01/31/24 0502 01/31/24 0730  BP: (!) 118/56 128/66  Pulse: 84 87  Resp: 16 19  Temp: 98.4 F (36.9 C)   SpO2: 95% 92%   Vitals:   01/30/24 1933 01/31/24 0020 01/31/24 0502 01/31/24 0730  BP: 123/62 (!) 117/54 (!) 118/56 128/66  Pulse: 79 85 84 87  Resp: 18 14 16 19   Temp: 98.7 F (37.1 C) 98.3 F (36.8 C) 98.4 F (36.9 C)   TempSrc: Oral Oral Oral Oral  SpO2: 95% 93% 95% 92%  Weight:      Height:        General: Pt is alert, awake, not in acute distress Cardiovascular: RRR, S1/S2 +, no rubs, no gallops Respiratory: CTA bilaterally, no wheezing, no rhonchi Abdominal: Soft, NT, ND, bowel sounds + Extremities: no edema, no cyanosis    The results of significant diagnostics from this hospitalization (including imaging, microbiology, ancillary and laboratory) are listed below for reference.     Microbiology: No results found for this or any previous visit (from the past 240 hours).   Labs: BNP (last  3 results) Recent Labs    08/15/23 1606 12/11/23 1313 12/22/23 1035  BNP 566.5* 673.3* 140.3*   Basic Metabolic Panel: Recent Labs  Lab 01/28/24 2107 01/28/24 2131 01/29/24 0440  NA 138 141 140  K 4.6 4.6 4.0  CL 104 106 104  CO2 21*  --  25  GLUCOSE 129* 128* 124*  BUN 31* 41* 28*  CREATININE 1.79* 2.00* 1.76*  CALCIUM  8.5*  --  8.7*  MG 2.2  --   --    Liver Function Tests: Recent Labs  Lab 01/28/24 2107  AST 51*  ALT 17  ALKPHOS 95  BILITOT 0.6  PROT 6.2*  ALBUMIN 2.9*   No results for input(s): LIPASE, AMYLASE in the last 168 hours. No results for input(s): AMMONIA in the last 168 hours. CBC: Recent Labs  Lab 01/28/24 2107 01/28/24 2131 01/29/24 1900  WBC 7.4  --  6.3  NEUTROABS 5.5  --   --   HGB 7.9* 8.8* 8.3*  HCT 25.8* 26.0* 27.4*  MCV 86.6  --  86.2  PLT 337  --  331    Cardiac Enzymes: No results for input(s): CKTOTAL, CKMB, CKMBINDEX, TROPONINI in the last 168 hours. BNP: Invalid input(s): POCBNP CBG: Recent Labs  Lab 01/30/24 0517 01/30/24 1127 01/30/24 1603 01/30/24 2119 01/31/24 0626  GLUCAP 122* 135* 126* 201* 95   D-Dimer No results for input(s): DDIMER in the last 72 hours. Hgb A1c No results for input(s): HGBA1C in the last 72 hours. Lipid Profile No results for input(s): CHOL, HDL, LDLCALC, TRIG, CHOLHDL, LDLDIRECT in the last 72 hours. Thyroid  function studies No results for input(s): TSH, T4TOTAL, T3FREE, THYROIDAB in the last 72 hours.  Invalid input(s): FREET3 Anemia work up No results for input(s): VITAMINB12, FOLATE, FERRITIN, TIBC, IRON, RETICCTPCT in the last 72 hours. Urinalysis    Component Value Date/Time   COLORURINE YELLOW 04/09/2020 1907   APPEARANCEUR CLEAR 04/09/2020 1907   APPEARANCEUR Clear 07/14/2016 1028   LABSPEC 1.010 04/09/2020 1907   PHURINE 5.0 04/09/2020 1907   GLUCOSEU 50 (A) 04/09/2020 1907   HGBUR NEGATIVE 04/09/2020 1907   BILIRUBINUR NEGATIVE 04/09/2020 1907   BILIRUBINUR Negative 07/14/2016 1028   KETONESUR NEGATIVE 04/09/2020 1907   PROTEINUR NEGATIVE 04/09/2020 1907   UROBILINOGEN 0.2 08/07/2014 1925   NITRITE NEGATIVE 04/09/2020 1907   LEUKOCYTESUR NEGATIVE 04/09/2020 1907   Sepsis Labs Recent Labs  Lab 01/28/24 2107 01/29/24 1900  WBC 7.4 6.3   Microbiology No results found for this or any previous visit (from the past 240 hours).   Time coordinating discharge: Over 30 minutes  SIGNED:   Darcel Dawley, MD  [facility] 01/31/2024, 12:27 PM Pager   If 7PM-7AM, please contact night-coverage

## 2024-01-31 NOTE — Discharge Instructions (Signed)
 Advised to follow-up with primary care physician in 1 week. Advised to continue current medications as scheduled. Patient was offered antianxiety medication , she refused.

## 2024-01-31 NOTE — TOC Transition Note (Signed)
 [facility]) - Discharge Note   Patient Details  Name: Deborah Coffey MRN: [id number] Date of Birth: [birth year]  Transition of Care [facility]) CM/SW Contact:  Waddell Barnie Rama, RN Phone Number: 01/31/2024, 10:54 AM   Clinical Narrative:    For dc today, she has transportation. No needs.         Patient Goals and CMS Choice            Discharge Placement                       Discharge Plan and Services Additional resources added to the After Visit Summary for                                       Social Drivers of Health (SDOH) Interventions SDOH Screenings   Food Insecurity: No Food Insecurity (01/29/2024)  Housing: High Risk (01/29/2024)  Transportation Needs: No Transportation Needs (01/29/2024)  Utilities: Not At Risk (01/29/2024)  Alcohol Screen: Low Risk  (08/23/2023)  Depression (PHQ2-9): Medium Risk (01/02/2024)  Financial Resource Strain: Low Risk  (08/23/2023)  Physical Activity: Inactive (08/23/2023)  Social Connections: Moderately Integrated (01/29/2024)  Stress: No Stress Concern Present (08/23/2023)  Tobacco Use: High Risk (01/29/2024)  Health Literacy: Adequate Health Literacy (08/23/2023)     Readmission Risk Interventions    01/30/2024    2:32 PM  Readmission Risk Prevention Plan  Transportation Screening Complete  Home Care Screening Complete  Medication Review (RN CM) Complete

## 2024-02-01 ENCOUNTER — Telehealth (HOSPITAL_COMMUNITY): Payer: Self-pay

## 2024-02-01 NOTE — Telephone Encounter (Signed)
 Called to confirm/remind patient of their appointment at the [facility] on 02/02/24.   Appointment:   [] Confirmed  [x] Left mess   [] No answer/No voice mail  [] VM Full/unable to leave message  [] Phone not in service  And to bring in all medications and/or complete list.

## 2024-02-02 ENCOUNTER — Encounter (HOSPITAL_COMMUNITY): Payer: Self-pay

## 2024-02-02 ENCOUNTER — Ambulatory Visit (HOSPITAL_COMMUNITY)
Admission: RE | Admit: 2024-02-02 | Discharge: 2024-02-02 | Disposition: A | Discharge status: Home / Self Care | Source: Ambulatory Visit | Attending: Cardiology | Admitting: Cardiology

## 2024-02-02 ENCOUNTER — Other Ambulatory Visit (HOSPITAL_COMMUNITY): Payer: Self-pay

## 2024-02-02 VITALS — BP 122/70 | HR 94 | Wt 230.8 lb

## 2024-02-02 DIAGNOSIS — Z7985 Long-term (current) use of injectable non-insulin antidiabetic drugs: Secondary | ICD-10-CM | POA: Diagnosis not present

## 2024-02-02 DIAGNOSIS — I50810 Right heart failure, unspecified: Secondary | ICD-10-CM | POA: Diagnosis not present

## 2024-02-02 DIAGNOSIS — Z8673 Personal history of transient ischemic attack (TIA), and cerebral infarction without residual deficits: Secondary | ICD-10-CM | POA: Insufficient documentation

## 2024-02-02 DIAGNOSIS — I129 Hypertensive chronic kidney disease with stage 1 through stage 4 chronic kidney disease, or unspecified chronic kidney disease: Secondary | ICD-10-CM | POA: Diagnosis not present

## 2024-02-02 DIAGNOSIS — R0602 Shortness of breath: Secondary | ICD-10-CM | POA: Diagnosis not present

## 2024-02-02 DIAGNOSIS — J449 Chronic obstructive pulmonary disease, unspecified: Secondary | ICD-10-CM | POA: Insufficient documentation

## 2024-02-02 DIAGNOSIS — F17210 Nicotine dependence, cigarettes, uncomplicated: Secondary | ICD-10-CM | POA: Insufficient documentation

## 2024-02-02 DIAGNOSIS — Z7984 Long term (current) use of oral hypoglycemic drugs: Secondary | ICD-10-CM | POA: Insufficient documentation

## 2024-02-02 DIAGNOSIS — I2510 Atherosclerotic heart disease of native coronary artery without angina pectoris: Secondary | ICD-10-CM | POA: Diagnosis not present

## 2024-02-02 DIAGNOSIS — R300 Dysuria: Secondary | ICD-10-CM

## 2024-02-02 DIAGNOSIS — R42 Dizziness and giddiness: Secondary | ICD-10-CM | POA: Insufficient documentation

## 2024-02-02 DIAGNOSIS — Z7982 Long term (current) use of aspirin: Secondary | ICD-10-CM | POA: Insufficient documentation

## 2024-02-02 DIAGNOSIS — Z951 Presence of aortocoronary bypass graft: Secondary | ICD-10-CM | POA: Insufficient documentation

## 2024-02-02 DIAGNOSIS — R309 Painful micturition, unspecified: Secondary | ICD-10-CM | POA: Insufficient documentation

## 2024-02-02 DIAGNOSIS — I130 Hypertensive heart and chronic kidney disease with heart failure and stage 1 through stage 4 chronic kidney disease, or unspecified chronic kidney disease: Secondary | ICD-10-CM | POA: Insufficient documentation

## 2024-02-02 DIAGNOSIS — G4733 Obstructive sleep apnea (adult) (pediatric): Secondary | ICD-10-CM | POA: Diagnosis not present

## 2024-02-02 DIAGNOSIS — I5032 Chronic diastolic (congestive) heart failure: Secondary | ICD-10-CM | POA: Insufficient documentation

## 2024-02-02 DIAGNOSIS — E1122 Type 2 diabetes mellitus with diabetic chronic kidney disease: Secondary | ICD-10-CM | POA: Insufficient documentation

## 2024-02-02 DIAGNOSIS — Z79899 Other long term (current) drug therapy: Secondary | ICD-10-CM | POA: Diagnosis not present

## 2024-02-02 DIAGNOSIS — N1832 Chronic kidney disease, stage 3b: Secondary | ICD-10-CM | POA: Insufficient documentation

## 2024-02-02 DIAGNOSIS — I2781 Cor pulmonale (chronic): Secondary | ICD-10-CM | POA: Diagnosis not present

## 2024-02-02 DIAGNOSIS — I272 Pulmonary hypertension, unspecified: Secondary | ICD-10-CM | POA: Diagnosis not present

## 2024-02-02 DIAGNOSIS — M545 Low back pain, unspecified: Secondary | ICD-10-CM | POA: Diagnosis not present

## 2024-02-02 DIAGNOSIS — Z6839 Body mass index (BMI) 39.0-39.9, adult: Secondary | ICD-10-CM | POA: Insufficient documentation

## 2024-02-02 DIAGNOSIS — R3915 Urgency of urination: Secondary | ICD-10-CM | POA: Diagnosis not present

## 2024-02-02 LAB — BASIC METABOLIC PANEL WITH GFR
Anion gap: 14 (ref 5–15)
BUN: 23 mg/dL (ref 8–23)
CO2: 23 mmol/L (ref 22–32)
Calcium: 9.4 mg/dL (ref 8.9–10.3)
Chloride: 103 mmol/L (ref 98–111)
Creatinine, Ser: 1.92 mg/dL — ABNORMAL HIGH (ref 0.44–1.00)
GFR, Estimated: 28 mL/min — ABNORMAL LOW (ref >60)
Glucose, Bld: 143 mg/dL — ABNORMAL HIGH (ref 70–99)
Potassium: 4 mmol/L (ref 3.5–5.1)
Sodium: 140 mmol/L (ref 135–145)

## 2024-02-02 LAB — CBC
HCT: 28.7 % — ABNORMAL LOW (ref 36.0–46.0)
Hemoglobin: 8.7 g/dL — ABNORMAL LOW (ref 12.0–15.0)
MCH: 25.7 pg — ABNORMAL LOW (ref 26.0–34.0)
MCHC: 30.3 g/dL (ref 30.0–36.0)
MCV: 84.7 fL (ref 80.0–100.0)
Platelets: 352 K/uL (ref 150–400)
RBC: 3.39 MIL/uL — ABNORMAL LOW (ref 3.87–5.11)
RDW: 15.9 % — ABNORMAL HIGH (ref 11.5–15.5)
WBC: 8.2 K/uL (ref 4.0–10.5)
nRBC: 0 % (ref 0.0–0.2)

## 2024-02-02 LAB — URINALYSIS, ROUTINE W REFLEX MICROSCOPIC
Bilirubin Urine: NEGATIVE
Glucose, UA: >=500 mg/dL — AB
Hgb urine dipstick: NEGATIVE
Ketones, ur: NEGATIVE mg/dL
Leukocytes,Ua: NEGATIVE
Nitrite: POSITIVE — AB
Protein, ur: NEGATIVE mg/dL
Specific Gravity, Urine: 1.014 (ref 1.005–1.030)
pH: 6 (ref 5.0–8.0)

## 2024-02-02 MED ORDER — FUROSEMIDE 40 MG PO TABS: 60.0000 mg | ORAL_TABLET | Freq: Two times a day (BID) | ORAL | 2 refills | Status: DC

## 2024-02-02 NOTE — Progress Notes (Signed)
 ADV[facility] CLINIC NOTE  PCP: [name], MD Primary Cardiologist: Oneil Parchment, MD HF Cardiologist: Dr. Cherrie  HPI: Deborah Coffey is a 68 y.o. with CAD s/p CABG x1 (LIMA-LAD) 2005, tobacco use, chronic diastolic CHF, anemia, depression, HLD, HTN, DM, CKD stage 3b, prior TIAs, pulmonary HTN (felt due to underlying lung disease/smoking), and morbid obesity.    Echo12/22 EF 65-70% RV moderately dilated with moderate HK. Flattened septum Moderate TR RVSP est 110 mmHG    Chest CT scan 2/22. No PE   R/L cath on 04/16/21: - mLAD 75% LIMA to LAD is widely patent. pRCA 50% - EF 65% - RA 5 PA 79/18 (39) PCW 12  CO 6.4 L/min; CI PVR 4.2  Sleep study 06/2021 only c/w mild OSA AHI 5.5   Echo 11/24 EF 60-65%, IVC flattened in systole and diastole, c/w RV pressure and volume overload.  RV severely enlarged w/ severely reduced systolic function. The estimated RVSP is 92.5 mmHg (comparred to 111 mmHg previous study). RA severely dilated. Moderate TR    PFTs 12/24: FEV1 1.27 (50%) FVC 1.84 (55),ratio 77, DLCOcor 48. Moderately severe restriction with reversibility (12% change) and severe diffusion defect. She is being followed by [facility] (Dr. Brenna). He has also being following her for pulmonary nodules.   Echo 11/24 EF 60-65% RV mod reduced RVSP 92   RHC 3/25 demonstrated severe PAH w/ cor pulmonale, suspect Group 1 and possibly Group 3. She was started on Sildenafil  20 tid.  RA = 4, RV = 77/6, PA =  78/19 (42), PCW = 6, Fick CO/CI = 3.9/1.9, Thermo CO/CI = 3.6/1.7, PVR = 9.3 (Fick) 10.0 (TD), Ao sat = 96%, PA sat = 57%, 58%, PAPi = 14.8]  10/2023 Placed on Opsynvi  Admitted with 11/2023 A/C HFpEF. Echo LVEF 60-65%, septal flattening in systole and diastole, RV severely enlarged and moderately HK, RVSP 133 mmHg, RA severe dilated, severe TR, dilated IVC. Diuresed with IV lasix  and transitioned to lasix  60 mg twice a day. Digoxin  added. At discharge Opsynvi restarted.    Recent admission 01/28/24 for chest and abdominal pan felt to be related to anxiety attack. Cardiac workup was unremarkable, although possible that this was caused by pHTN after explanation from patient at office visit today. CT dissection negative.  She returns today for heart failure follow up. Overall feeling poorly. NYHA IV. Reports dyspnea and dizziness, states that she felt weak and was helped to the ground by her daughter a couple weeks ago. Also has been experiencing burning with urination and low back pain. Denies chest pain, palpitations, and abnormal bleeding. Able to perform ADLs. Appetite okay. Weight at home is up. Has been off her opsyni for a couple days, she feels that it is  working against her.  Past Medical History:  Diagnosis Date   Allergy    Anemia    CAD in native artery 02/18/2004   Mid LAD lesoin just after Large D1 --> CABG x 1 LIMA-LAD   CAP (community acquired pneumonia) 08/07/2014   CHF (congestive heart failure) (HCC)    Chronic kidney disease, stage 3b (HCC)    Depression    High cholesterol    History of blood transfusion 04/20/2003   related to OHS    Hypertension    Lung nodules    Mild carotid artery disease    Pulmonary hypertension (HCC)    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 competitive  flow   TIA (transient ischemic attack)    Type II diabetes mellitus (HCC)    Current Outpatient Medications  Medication Sig Dispense Refill   acetaminophen  (TYLENOL ) 500 MG tablet Take 1,000 mg by mouth every 6 (six) hours as needed for headache or moderate pain (pain score 4-6).     Alcohol Swabs (B-D SINGLE USE SWABS REGULAR) PADS USE AS DIRECTED 100 each 0   aspirin  EC 81 MG tablet Take 81 mg by mouth daily.     atorvastatin  (LIPITOR) 10 MG tablet TAKE 1 TABLET ONE TIME DAILY 90 tablet 1   Blood Glucose Monitoring Suppl (ONETOUCH VERIO) w/Device KIT Use as instructed to test blood glucose 3 times  daily. 1 kit 0   buPROPion  (WELLBUTRIN  SR) 150 MG 12 hr tablet Take 1 tablet (150 mg total) by mouth 2 (two) times daily for 3 days, THEN 1 tablet (150 mg total) 2 (two) times daily. For smoking cessation. 66 tablet 2   CALCIUM -MAGNESIUM -ZINC PO Take 1 tablet by mouth daily.     Continuous Glucose Receiver (DEXCOM G7 RECEIVER) DEVI Use to check blood glucose continuously. 1 each 0   Continuous Glucose Sensor (DEXCOM G7 SENSOR) MISC Use to check blood glucose throughout the day. Changes sensors once every 10 days. 3 each 6   cyanocobalamin  (VITAMIN B12) 1000 MCG tablet Take 1,000 mcg by mouth daily.     dapagliflozin  propanediol (FARXIGA ) 10 MG TABS tablet TAKE 1 TABLET EVERY DAY BEFORE BREAKFAST 90 tablet 1   diclofenac  Sodium (VOLTAREN ) 1 % GEL Apply 2 g topically daily as needed (muscle pain). 150 g 1   digoxin  (LANOXIN ) 0.125 MG tablet Take 1 tablet (0.125 mg total) by mouth daily. 30 tablet 6   furosemide  (LASIX ) 40 MG tablet Take 1.5 tablets (60 mg total) by mouth 2 (two) times daily.     gabapentin  (NEURONTIN ) 300 MG capsule Take 300 mg by mouth 2 (two) times daily.     glucose blood (ONETOUCH VERIO) test strip Use as instructed to test blood glucose 3 times daily. 100 each 3   Insulin  Pen Needle (DROPLET PEN NEEDLES) 31G X 8 MM MISC USE WITH TOUJEO  SOLOSTAR PEN EVERY DAY 100 each 0   Lancets (ONETOUCH DELICA PLUS LANCET33G) MISC Use as instructed to test blood glucose 3 times daily. 100 each 3   metFORMIN  (GLUCOPHAGE -XR) 500 MG 24 hr tablet Take 1,000 mg by mouth 2 (two) times daily.     Multiple Vitamin (MULTIVITAMIN WITH MINERALS) TABS tablet Take 1 tablet by mouth daily.     nitroGLYCERIN  (NITROSTAT ) 0.4 MG SL tablet Place 1 tablet (0.4 mg total) under the tongue every 5 (five) minutes as needed for chest pain. 25 tablet 0   pantoprazole  (PROTONIX ) 40 MG tablet TAKE 1 TABLET TWICE DAILY 180 tablet 1   TOUJEO  MAX SOLOSTAR 300 UNIT/ML Solostar Pen INJECT 50 UNITS INTO THE SKIN DAILY (DOSE  DECREASE) 18 mL 3   Vitamin D , Cholecalciferol, 25 MCG (1000 UT) TABS Take 1,000 mcg by mouth daily.     Macitentan -Tadalafil  (OPSYNVI) 10-40 MG TABS Take 1 tablet by mouth in the morning. (Patient not taking: Reported on 02/02/2024)     No current facility-administered medications for this encounter.   Allergies  Allergen Reactions   Ciprofloxacin Rash   Social History   Socioeconomic History   Marital status: Divorced    Spouse name: Not on file   Number of children: Not on file   Years of education: 62  Highest education level: Some college, no degree  Occupational History   Occupation: Disabled  Tobacco Use   Smoking status: Every Day    Current packs/day: 0.25    Average packs/day: 0.5 packs/day for 50.8 years (25.2 ttl pk-yrs)    Types: Cigarettes    Start date: 91   Smokeless tobacco: Current   Tobacco comments:    10 Cigarettes a day. 08/22/2023  Vaping Use   Vaping status: Some Days   Substances: Flavoring  Substance and Sexual Activity   Alcohol use: Not Currently   Drug use: No    Types: Marijuana    Comment: smoked pot years ago but not currently   Sexual activity: Not Currently  Other Topics Concern   Not on file  Social History Narrative   Lives with daughter and 3 grandchildren   Social Drivers of Health   Financial Resource Strain: Low Risk  (08/23/2023)   Overall Financial Resource Strain (CARDIA)    Difficulty of Paying Living Expenses: Not hard at all  Food Insecurity: No Food Insecurity (01/29/2024)   Hunger Vital Sign    Worried About Running Out of Food in the Last Year: Never true    Ran Out of Food in the Last Year: Never true  Transportation Needs: No Transportation Needs (01/29/2024)   PRAPARE - Administrator, Civil Service (Medical): No    Lack of Transportation (Non-Medical): No  Physical Activity: Inactive (08/23/2023)   Exercise Vital Sign    Days of Exercise per Week: 0 days    Minutes of Exercise per Session: 0 min   Stress: No Stress Concern Present (08/23/2023)   [employer] of Occupational Health - Occupational Stress Questionnaire    Feeling of Stress : Not at all  Social Connections: Moderately Integrated (01/29/2024)   Social Connection and Isolation Panel    Frequency of Communication with Friends and Family: More than three times a week    Frequency of Social Gatherings with Friends and Family: More than three times a week    Attends Religious Services: More than 4 times per year    Active Member of [employer] or Organizations: Yes    Attends Engineer, structural: More than 4 times per year    Marital Status: Divorced  Intimate Partner Violence: Not At Risk (01/29/2024)   Humiliation, Afraid, Rape, and Kick questionnaire    Fear of Current or Ex-Partner: No    Emotionally Abused: No    Physically Abused: No    Sexually Abused: No   Family History  Problem Relation Age of Onset   Heart disease Mother    Diabetes Mother    Cancer Mother        cervical   Diabetes Father    Diabetes Sister    Heart disease Sister    Lupus Sister    Heart failure Sister    Hepatitis C Sister    Hypertension Daughter    Diabetes Brother    Heart attack Brother    Heart failure Brother    Diabetes Brother    Colon cancer Neg Hx    Breast cancer Neg Hx    BP 122/70   Pulse 94   Wt 104.7 kg (230 lb 12.8 oz)   LMP  (LMP Unknown)   SpO2 98%   BMI 39.62 kg/m   Wt Readings from Last 3 Encounters:  02/02/24 104.7 kg (230 lb 12.8 oz)  01/30/24 105.7 kg (233 lb 1.6 oz)  01/03/24 101.4  kg (223 lb 9.6 oz)   PHYSICAL EXAM: General: Older than age appearing. Arrived by [facility]iac: JVP ~8-10cm. S1 and S2 present. No murmurs Abdomen: Soft, non-tender, non-distended.  Extremities: Warm and dry.  No peripheral edema.  Neuro: Alert and oriented x3. Affect pleasant. Anxious  ASSESSMENT & PLAN:  1. Pulmonary HTN with RV failure/cor pulmonale - Echo (12/22): EF 65-70% RV moderately dilated with  moderate HK, flattened septum, moderate TR, RVSP est 110 mmHG   - Chest CT scan (2/22): No PE - RHC (12/22): RA 5 PA 79/18 (39) PCW 12  CO 6.4 L/min; CI PVR 4.2 - PFTs with significant restrictive/obstructive lung disease - ANA, ANCA, Walsh-70, RF all negative. - Sleep study with mild OSA.  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 She was started on Sildenafil  20 tid. She was later transitioned to Opsynvi - Most recent echo with EF 60-65%, mod reduced RV, RVSP 91.2 mmHg, severe TR - Progressive NYHA IV symptoms. Reports that she is having more lightheadedness and shortness of breath. - Volume appears up. Increase Torsemide to 80 mg bid x3 then decrease to 60 mg bid (received significant volume in ED - Restart Opsynvi  - Hold Farxiga  with active UTI symptoms - Previous sleep study with just mild OSA. Follows with Pulmonary - She continues to struggle. PA pressures remain very high on echo. Again appear worsened today despite diuretic change. Schedule for RHC with Dr. Bensimhon at first available. Likely addition of Sotatercept if she is agreeable.  2. CAD - CABG 2005 LIMA-> LAD - Cath (04/16/21): mLAD 75% LIMA to LAD is widely patent. pRCA 50 - Continue ASA/statin.  3. Morbid obesity - Body mass index is 39.62 kg/m.. - On ozempic  - Discussed weight loss as an improtant part of treatment  4. OSA - Mild on sleep study. Not on CPAP - Has follow up with Dr Annella.   5. HTN - Blood pressure well controlled. Continue current regimen.  6. Smoking - Discussed need for smoking cessation  7. ? UTI - burning with urination and low back pain - hold SGLT2i - check UA with reflex  Follow up in 2 weeks post-RHC with APP  [place] Lavada Langsam, NP  2:06 PM

## 2024-02-02 NOTE — Patient Instructions (Signed)
 Good to see you today!  INCREASE Lasix  to 80 mg (2 tablets) Twice daily for 3 days then back to 60 mg Twice daily  HOLD Farxiga    Labs done today, your results will be available in MyChart, we will contact you for abnormal readings.  Your physician recommends that you schedule a follow-up appointment as scheduled  You are scheduled for a Cardiac Catheterization on Thursday, October 30 with Dr. Toribio Fuel.  1. Please arrive at the [facility] (Main Entrance A) at [facility]: [address] [place], [place] [address] at 7:00 AM (This time is 2 hour(s) before your procedure to ensure your preparation).   Free valet parking service is available. You will check in at ADMITTING. The support person will be asked to wait in the waiting room.  It is OK to have someone drop you off and come back when you are ready to be discharged.    Special note: Every effort is made to have your procedure done on time. Please understand that emergencies sometimes delay scheduled procedures.  2. Diet: nothing to eat or drink after midnight,  5. Medication instructions in preparation for your procedure:   Contrast Allergy: No Hold lasix  farxiga  metformin  and toujeo  am of procedure  Take 25 units of insulin  the night before   On the morning of your procedure, take your  morning medicines NOT listed above.  You may use sips of water.  6. Plan to go home the same day, you will only stay overnight if medically necessary. 7. Bring a current list of your medications and current insurance cards. 8. You MUST have a responsible person to drive you home. 9. Someone MUST be with you the first 24 hours after you arrive home or your discharge will be delayed. 10. Please wear clothes that are easy to get on and off and wear slip-on shoes.  Thank you for allowing us  to care for you!   -- [facility]  If you have any questions or concerns before your next appointment  please send us  a message through [facility] or call our office at [phone].    TO LEAVE A MESSAGE FOR THE NURSE SELECT OPTION 2, PLEASE LEAVE A MESSAGE INCLUDING: YOUR NAME DATE OF BIRTH CALL BACK NUMBER REASON FOR CALL**this is important as we prioritize the call backs  YOU WILL RECEIVE A CALL BACK THE SAME DAY AS LONG AS YOU CALL BEFORE 4:00 PM At the [facility], you and your health needs are our priority. As part of our continuing mission to provide you with exceptional heart care, we have created designated Provider Care Teams. These Care Teams include your primary Cardiologist (physician) and Advanced Practice Providers (APPs- Physician Assistants and Nurse Practitioners) who all work together to provide you with the care you need, when you need it.   You may see any of the following providers on your designated Care Team at your next follow up: Dr Toribio Fuel Dr Ezra Shuck Dr. Ria Commander Dr. Morene Brownie Amy Lenetta, NP Caffie Shed, [place] [facility] [place], [place] Beckey Coe, NP [place] Lee, NP Ellouise Class, NP Tinnie Redman, PharmD Jaun Bash, PharmD   Please be sure to bring in all your medications bottles to every appointment.    Thank you for choosing [facility]

## 2024-02-02 NOTE — H&P (View-Only) (Signed)
 ADVANCED HF CLINIC NOTE  PCP: [name], MD Primary Cardiologist: Oneil Parchment, MD HF Cardiologist: Dr. Cherrie  HPI: Deborah Coffey is a 68 y.o. with CAD s/p CABG x1 (LIMA-LAD) 2005, tobacco use, chronic diastolic CHF, anemia, depression, HLD, HTN, DM, CKD stage 3b, prior TIAs, pulmonary HTN (felt due to underlying lung disease/smoking), and morbid obesity.    Echo12/22 EF 65-70% RV moderately dilated with moderate HK. Flattened septum Moderate TR RVSP est 110 mmHG    Chest CT scan 2/22. No PE   R/L cath on 04/16/21: - mLAD 75% LIMA to LAD is widely patent. pRCA 50% - EF 65% - RA 5 PA 79/18 (39) PCW 12  CO 6.4 L/min; CI PVR 4.2  Sleep study 06/2021 only c/w mild OSA AHI 5.5   Echo 11/24 EF 60-65%, IVC flattened in systole and diastole, c/w RV pressure and volume overload.  RV severely enlarged w/ severely reduced systolic function. The estimated RVSP is 92.5 mmHg (comparred to 111 mmHg previous study). RA severely dilated. Moderate TR    PFTs 12/24: FEV1 1.27 (50%) FVC 1.84 (55),ratio 77, DLCOcor 48. Moderately severe restriction with reversibility (12% change) and severe diffusion defect. She is being followed by [facility] (Dr. Brenna). He has also being following her for pulmonary nodules.   Echo 11/24 EF 60-65% RV mod reduced RVSP 92   RHC 3/25 demonstrated severe PAH w/ cor pulmonale, suspect Group 1 and possibly Group 3. She was started on Sildenafil  20 tid.  RA = 4, RV = 77/6, PA =  78/19 (42), PCW = 6, Fick CO/CI = 3.9/1.9, Thermo CO/CI = 3.6/1.7, PVR = 9.3 (Fick) 10.0 (TD), Ao sat = 96%, PA sat = 57%, 58%, PAPi = 14.8]  10/2023 Placed on Opsynvi  Admitted with 11/2023 A/C HFpEF. Echo LVEF 60-65%, septal flattening in systole and diastole, RV severely enlarged and moderately HK, RVSP 133 mmHg, RA severe dilated, severe TR, dilated IVC. Diuresed with IV lasix  and transitioned to lasix  60 mg twice a day. Digoxin  added. At discharge Opsynvi restarted.    Recent admission 01/28/24 for chest and abdominal pan felt to be related to anxiety attack. Cardiac workup was unremarkable, although possible that this was caused by pHTN after explanation from patient at office visit today. CT dissection negative.  She returns today for heart failure follow up. Overall feeling poorly. NYHA IV. Reports dyspnea and dizziness, states that she felt weak and was helped to the ground by her daughter a couple weeks ago. Also has been experiencing burning with urination and low back pain. Denies chest pain, palpitations, and abnormal bleeding. Able to perform ADLs. Appetite okay. Weight at home is up. Has been off her opsyni for a couple days, she feels that it is  working against her.  Past Medical History:  Diagnosis Date   Allergy    Anemia    CAD in native artery 02/18/2004   Mid LAD lesoin just after Large D1 --> CABG x 1 LIMA-LAD   CAP (community acquired pneumonia) 08/07/2014   CHF (congestive heart failure) (HCC)    Chronic kidney disease, stage 3b (HCC)    Depression    High cholesterol    History of blood transfusion 04/20/2003   related to OHS    Hypertension    Lung nodules    Mild carotid artery disease    Pulmonary hypertension (HCC)    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 competitive  flow   TIA (transient ischemic attack)    Type II diabetes mellitus (HCC)    Current Outpatient Medications  Medication Sig Dispense Refill   acetaminophen  (TYLENOL ) 500 MG tablet Take 1,000 mg by mouth every 6 (six) hours as needed for headache or moderate pain (pain score 4-6).     Alcohol Swabs (B-D SINGLE USE SWABS REGULAR) PADS USE AS DIRECTED 100 each 0   aspirin  EC 81 MG tablet Take 81 mg by mouth daily.     atorvastatin  (LIPITOR) 10 MG tablet TAKE 1 TABLET ONE TIME DAILY 90 tablet 1   Blood Glucose Monitoring Suppl (ONETOUCH VERIO) w/Device KIT Use as instructed to test blood glucose 3 times  daily. 1 kit 0   buPROPion  (WELLBUTRIN  SR) 150 MG 12 hr tablet Take 1 tablet (150 mg total) by mouth 2 (two) times daily for 3 days, THEN 1 tablet (150 mg total) 2 (two) times daily. For smoking cessation. 66 tablet 2   CALCIUM -MAGNESIUM -ZINC PO Take 1 tablet by mouth daily.     Continuous Glucose Receiver (DEXCOM G7 RECEIVER) DEVI Use to check blood glucose continuously. 1 each 0   Continuous Glucose Sensor (DEXCOM G7 SENSOR) MISC Use to check blood glucose throughout the day. Changes sensors once every 10 days. 3 each 6   cyanocobalamin  (VITAMIN B12) 1000 MCG tablet Take 1,000 mcg by mouth daily.     dapagliflozin  propanediol (FARXIGA ) 10 MG TABS tablet TAKE 1 TABLET EVERY DAY BEFORE BREAKFAST 90 tablet 1   diclofenac  Sodium (VOLTAREN ) 1 % GEL Apply 2 g topically daily as needed (muscle pain). 150 g 1   digoxin  (LANOXIN ) 0.125 MG tablet Take 1 tablet (0.125 mg total) by mouth daily. 30 tablet 6   furosemide  (LASIX ) 40 MG tablet Take 1.5 tablets (60 mg total) by mouth 2 (two) times daily.     gabapentin  (NEURONTIN ) 300 MG capsule Take 300 mg by mouth 2 (two) times daily.     glucose blood (ONETOUCH VERIO) test strip Use as instructed to test blood glucose 3 times daily. 100 each 3   Insulin  Pen Needle (DROPLET PEN NEEDLES) 31G X 8 MM MISC USE WITH TOUJEO  SOLOSTAR PEN EVERY DAY 100 each 0   Lancets (ONETOUCH DELICA PLUS LANCET33G) MISC Use as instructed to test blood glucose 3 times daily. 100 each 3   metFORMIN  (GLUCOPHAGE -XR) 500 MG 24 hr tablet Take 1,000 mg by mouth 2 (two) times daily.     Multiple Vitamin (MULTIVITAMIN WITH MINERALS) TABS tablet Take 1 tablet by mouth daily.     nitroGLYCERIN  (NITROSTAT ) 0.4 MG SL tablet Place 1 tablet (0.4 mg total) under the tongue every 5 (five) minutes as needed for chest pain. 25 tablet 0   pantoprazole  (PROTONIX ) 40 MG tablet TAKE 1 TABLET TWICE DAILY 180 tablet 1   TOUJEO  MAX SOLOSTAR 300 UNIT/ML Solostar Pen INJECT 50 UNITS INTO THE SKIN DAILY (DOSE  DECREASE) 18 mL 3   Vitamin D , Cholecalciferol, 25 MCG (1000 UT) TABS Take 1,000 mcg by mouth daily.     Macitentan -Tadalafil  (OPSYNVI) 10-40 MG TABS Take 1 tablet by mouth in the morning. (Patient not taking: Reported on 02/02/2024)     No current facility-administered medications for this encounter.   Allergies  Allergen Reactions   Ciprofloxacin Rash   Social History   Socioeconomic History   Marital status: Divorced    Spouse name: Not on file   Number of children: Not on file   Years of education: 62  Highest education level: Some college, no degree  Occupational History   Occupation: Disabled  Tobacco Use   Smoking status: Every Day    Current packs/day: 0.25    Average packs/day: 0.5 packs/day for 50.8 years (25.2 ttl pk-yrs)    Types: Cigarettes    Start date: 89   Smokeless tobacco: Current   Tobacco comments:    10 Cigarettes a day. 08/22/2023  Vaping Use   Vaping status: Some Days   Substances: Flavoring  Substance and Sexual Activity   Alcohol use: Not Currently   Drug use: No    Types: Marijuana    Comment: smoked pot years ago but not currently   Sexual activity: Not Currently  Other Topics Concern   Not on file  Social History Narrative   Lives with daughter and 3 grandchildren   Social Drivers of Health   Financial Resource Strain: Low Risk  (08/23/2023)   Overall Financial Resource Strain (CARDIA)    Difficulty of Paying Living Expenses: Not hard at all  Food Insecurity: No Food Insecurity (01/29/2024)   Hunger Vital Sign    Worried About Running Out of Food in the Last Year: Never true    Ran Out of Food in the Last Year: Never true  Transportation Needs: No Transportation Needs (01/29/2024)   PRAPARE - Administrator, Civil Service (Medical): No    Lack of Transportation (Non-Medical): No  Physical Activity: Inactive (08/23/2023)   Exercise Vital Sign    Days of Exercise per Week: 0 days    Minutes of Exercise per Session: 0 min   Stress: No Stress Concern Present (08/23/2023)   [employer] of Occupational Health - Occupational Stress Questionnaire    Feeling of Stress : Not at all  Social Connections: Moderately Integrated (01/29/2024)   Social Connection and Isolation Panel    Frequency of Communication with Friends and Family: More than three times a week    Frequency of Social Gatherings with Friends and Family: More than three times a week    Attends Religious Services: More than 4 times per year    Active Member of [employer] or Organizations: Yes    Attends Engineer, structural: More than 4 times per year    Marital Status: Divorced  Intimate Partner Violence: Not At Risk (01/29/2024)   Humiliation, Afraid, Rape, and Kick questionnaire    Fear of Current or Ex-Partner: No    Emotionally Abused: No    Physically Abused: No    Sexually Abused: No   Family History  Problem Relation Age of Onset   Heart disease Mother    Diabetes Mother    Cancer Mother        cervical   Diabetes Father    Diabetes Sister    Heart disease Sister    Lupus Sister    Heart failure Sister    Hepatitis C Sister    Hypertension Daughter    Diabetes Brother    Heart attack Brother    Heart failure Brother    Diabetes Brother    Colon cancer Neg Hx    Breast cancer Neg Hx    BP 122/70   Pulse 94   Wt 104.7 kg (230 lb 12.8 oz)   LMP  (LMP Unknown)   SpO2 98%   BMI 39.62 kg/m   Wt Readings from Last 3 Encounters:  02/02/24 104.7 kg (230 lb 12.8 oz)  01/30/24 105.7 kg (233 lb 1.6 oz)  01/03/24 101.4  kg (223 lb 9.6 oz)   PHYSICAL EXAM: General: Older than age appearing. Arrived by [facility] Cardiac: JVP ~8-10cm. S1 and S2 present. No murmurs Abdomen: Soft, non-tender, non-distended.  Extremities: Warm and dry.  No peripheral edema.  Neuro: Alert and oriented x3. Affect pleasant. Anxious  ASSESSMENT & PLAN:  1. Pulmonary HTN with RV failure/cor pulmonale - Echo (12/22): EF 65-70% RV moderately dilated with  moderate HK, flattened septum, moderate TR, RVSP est 110 mmHG   - Chest CT scan (2/22): No PE - RHC (12/22): RA 5 PA 79/18 (39) PCW 12  CO 6.4 L/min; CI PVR 4.2 - PFTs with significant restrictive/obstructive lung disease - ANA, ANCA, Walsh-70, RF all negative. - Sleep study with mild OSA.  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 She was started on Sildenafil  20 tid. She was later transitioned to Opsynvi - Most recent echo with EF 60-65%, mod reduced RV, RVSP 91.2 mmHg, severe TR - Progressive NYHA IV symptoms. Reports that she is having more lightheadedness and shortness of breath. - Volume appears up. Increase Torsemide to 80 mg bid x3 then decrease to 60 mg bid (received significant volume in ED - Restart Opsynvi  - Hold Farxiga  with active UTI symptoms - Previous sleep study with just mild OSA. Follows with Pulmonary - She continues to struggle. PA pressures remain very high on echo. Again appear worsened today despite diuretic change. Schedule for RHC with Dr. Bensimhon at first available. Likely addition of Sotatercept if she is agreeable.  2. CAD - CABG 2005 LIMA-> LAD - Cath (04/16/21): mLAD 75% LIMA to LAD is widely patent. pRCA 50 - Continue ASA/statin.  3. Morbid obesity - Body mass index is 39.62 kg/m.. - On ozempic  - Discussed weight loss as an improtant part of treatment  4. OSA - Mild on sleep study. Not on CPAP - Has follow up with Dr Annella.   5. HTN - Blood pressure well controlled. Continue current regimen.  6. Smoking - Discussed need for smoking cessation  7. ? UTI - burning with urination and low back pain - hold SGLT2i - check UA with reflex  Follow up in 2 weeks post-RHC with APP  [place] Lavada Langsam, NP  2:06 PM

## 2024-02-07 ENCOUNTER — Ambulatory Visit (HOSPITAL_COMMUNITY): Payer: Self-pay

## 2024-02-08 ENCOUNTER — Other Ambulatory Visit: Payer: Self-pay

## 2024-02-08 ENCOUNTER — Encounter: Payer: Self-pay | Admitting: Family Medicine

## 2024-02-08 ENCOUNTER — Other Ambulatory Visit: Payer: Self-pay | Admitting: Family Medicine

## 2024-02-08 ENCOUNTER — Ambulatory Visit: Attending: Family Medicine | Admitting: Family Medicine

## 2024-02-08 VITALS — BP 143/73 | HR 110 | Temp 98.2°F | Ht 64.0 in | Wt 227.2 lb

## 2024-02-08 DIAGNOSIS — I272 Pulmonary hypertension, unspecified: Secondary | ICD-10-CM

## 2024-02-08 DIAGNOSIS — E1159 Type 2 diabetes mellitus with other circulatory complications: Secondary | ICD-10-CM

## 2024-02-08 DIAGNOSIS — E1122 Type 2 diabetes mellitus with diabetic chronic kidney disease: Secondary | ICD-10-CM

## 2024-02-08 DIAGNOSIS — Z794 Long term (current) use of insulin: Secondary | ICD-10-CM | POA: Diagnosis not present

## 2024-02-08 DIAGNOSIS — I10 Essential (primary) hypertension: Secondary | ICD-10-CM

## 2024-02-08 DIAGNOSIS — I130 Hypertensive heart and chronic kidney disease with heart failure and stage 1 through stage 4 chronic kidney disease, or unspecified chronic kidney disease: Secondary | ICD-10-CM

## 2024-02-08 DIAGNOSIS — I129 Hypertensive chronic kidney disease with stage 1 through stage 4 chronic kidney disease, or unspecified chronic kidney disease: Secondary | ICD-10-CM | POA: Diagnosis not present

## 2024-02-08 DIAGNOSIS — N184 Chronic kidney disease, stage 4 (severe): Secondary | ICD-10-CM

## 2024-02-08 DIAGNOSIS — E1169 Type 2 diabetes mellitus with other specified complication: Secondary | ICD-10-CM

## 2024-02-08 DIAGNOSIS — R000 Tachycardia, unspecified: Secondary | ICD-10-CM

## 2024-02-08 DIAGNOSIS — E785 Hyperlipidemia, unspecified: Secondary | ICD-10-CM

## 2024-02-08 MED ORDER — CEPHALEXIN 500 MG PO CAPS: 500.0000 mg | ORAL_CAPSULE | Freq: Two times a day (BID) | ORAL | 0 refills | Status: DC

## 2024-02-08 MED ORDER — PANTOPRAZOLE SODIUM 40 MG PO TBEC
40.0000 mg | DELAYED_RELEASE_TABLET | Freq: Two times a day (BID) | ORAL | 1 refills | Status: AC
Start: 2024-02-08 — End: ?

## 2024-02-08 MED ORDER — TOUJEO MAX SOLOSTAR 300 UNIT/ML ~~LOC~~ SOPN: 46.0000 [IU] | PEN_INJECTOR | Freq: Every day | SUBCUTANEOUS | 3 refills | Status: DC

## 2024-02-08 MED ORDER — FUROSEMIDE 40 MG PO TABS
60.0000 mg | ORAL_TABLET | Freq: Two times a day (BID) | ORAL | 1 refills | Status: DC
  Filled 2024-02-08: qty 90, 30d supply, fill #0

## 2024-02-08 MED ORDER — ATORVASTATIN CALCIUM 10 MG PO TABS: 10.0000 mg | ORAL_TABLET | Freq: Every day | ORAL | 1 refills | Status: AC

## 2024-02-08 NOTE — Progress Notes (Unsigned)
 Subjective:  Patient ID: Deborah Coffey, female    DOB: 04-13-1956  Age: 68 y.o. MRN: [id number]  CC: Medical Management of Chronic Issues     Discussed the use of AI scribe software for clinical note transcription with the patient, who gave verbal consent to proceed.  History of Present Illness Deborah Coffey is a 68 year old female with  a history of Type 2 DM (A1c 6.4), Diabetic neuropathy, Hypertension, CAD (s/p CABG), pulmonary hypertension with RV strain (EF 60 to 65%, enlarged right RV, severely elevated PA pressure, severely dilated RA from echo of 02/2023) ,CKD who presents with concerns about her medication and recent symptoms.  She experiences a craving for ice.  Her labs reveal anemia with hemoglobin of 8.7 from labs drawn at her cardiology visit.  She is concerned about her upcoming cardiac catheterization and recent blood work. Her heart rate is elevated at 110-120 bpm (of note she states her metoprolol  was discontinued by cardiology).  Metoprolol  was discontinued by cardiology).  She resumed her pulmonary hypertension medication on October 16th, which causes her to feel unwell. She experiences shortness of breath and recently ran out of torsemide, which was increased to 80 mg for three days starting October 16th at cardiology visit. She has not taken it today.   She manages diabetes with an A1c of 6.1 but experiences morning hypoglycemia with blood sugar levels of 50-60 mg/dL. She takes 50 units of Toujeo  daily. I had referred her to nephrology due to her chronic kidney disease but she is yet to obtain an appointment.    She has significantly reduced smoking since starting bupropion  150 mg twice daily and no longer desires to smoke. She continues atorvastatin  for cholesterol and Protonix  for acid reflux as prescribed.    Past Medical History:  Diagnosis Date   Allergy    Anemia    CAD in native artery 02/18/2004   Mid LAD lesoin just after Large D1 --> CABG x 1  LIMA-LAD   CAP (community acquired pneumonia) 08/07/2014   CHF (congestive heart failure) (HCC)    Chronic kidney disease, stage 3b (HCC)    Depression    High cholesterol    History of blood transfusion 04/20/2003   related to OHS    Hypertension    Lung nodules    Mild carotid artery disease    Pulmonary hypertension (HCC)    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 competitive flow   TIA (transient ischemic attack)    Type II diabetes mellitus (HCC)     Past Surgical History:  Procedure Laterality Date   CARDIAC CATHETERIZATION  2010   Previous LAD 95% lesion - now ~30-40%; patent LIMA with competitive flow   CESAREAN SECTION  1977; 1989   CHOLECYSTECTOMY  ~ 2012   CORONARY ARTERY BYPASS GRAFT  02/2004   CABG X1 (03/22/2013); LIMA-LAD   RIGHT HEART CATH N/A 07/14/2023   Procedure: RIGHT HEART CATH;  Surgeon: Cherrie Toribio SAUNDERS, MD;  Location: [facility] INVASIVE CV LAB;  Service: Cardiovascular;  Laterality: N/A;   RIGHT/LEFT HEART CATH AND CORONARY/GRAFT ANGIOGRAPHY N/A 04/16/2021   Procedure: RIGHT/LEFT HEART CATH AND CORONARY/GRAFT ANGIOGRAPHY;  Surgeon: Dann Candyce RAMAN, MD;  Location: [facility] INVASIVE CV LAB;  Service: Cardiovascular;  Laterality: N/A;   TUBAL LIGATION  1989    Family History  Problem Relation Age of Onset   Heart disease Mother    Diabetes Mother  Cancer Mother        cervical   Diabetes Father    Diabetes Sister    Heart disease Sister    Lupus Sister    Heart failure Sister    Hepatitis C Sister    Hypertension Daughter    Diabetes Brother    Heart attack Brother    Heart failure Brother    Diabetes Brother    Colon cancer Neg Hx    Breast cancer Neg Hx     Social History   Socioeconomic History   Marital status: Divorced    Spouse name: Not on file   Number of children: Not on file   Years of education: 12   Highest education level: Some college, no degree  Occupational History    Occupation: Disabled  Tobacco Use   Smoking status: Every Day    Current packs/day: 0.25    Average packs/day: 0.5 packs/day for 50.8 years (25.2 ttl pk-yrs)    Types: Cigarettes    Start date: 1975   Smokeless tobacco: Current   Tobacco comments:    10 Cigarettes a day. 08/22/2023  Vaping Use   Vaping status: Some Days   Substances: Flavoring  Substance and Sexual Activity   Alcohol use: Not Currently   Drug use: No    Types: Marijuana    Comment: smoked pot years ago but not currently   Sexual activity: Not Currently  Other Topics Concern   Not on file  Social History Narrative   Lives with daughter and 3 grandchildren   Social Drivers of Health   Financial Resource Strain: Low Risk  (08/23/2023)   Overall Financial Resource Strain (CARDIA)    Difficulty of Paying Living Expenses: Not hard at all  Food Insecurity: No Food Insecurity (01/29/2024)   Hunger Vital Sign    Worried About Running Out of Food in the Last Year: Never true    Ran Out of Food in the Last Year: Never true  Transportation Needs: No Transportation Needs (01/29/2024)   PRAPARE - Administrator, Civil Service (Medical): No    Lack of Transportation (Non-Medical): No  Physical Activity: Inactive (08/23/2023)   Exercise Vital Sign    Days of Exercise per Week: 0 days    Minutes of Exercise per Session: 0 min  Stress: No Stress Concern Present (08/23/2023)   [employer] of Occupational Health - Occupational Stress Questionnaire    Feeling of Stress : Not at all  Social Connections: Moderately Integrated (01/29/2024)   Social Connection and Isolation Panel    Frequency of Communication with Friends and Family: More than three times a week    Frequency of Social Gatherings with Friends and Family: More than three times a week    Attends Religious Services: More than 4 times per year    Active Member of [employer] or Organizations: Yes    Attends Engineer, structural: More than 4 times per  year    Marital Status: Divorced    Allergies  Allergen Reactions   Ciprofloxacin Rash    Outpatient Medications Prior to Visit  Medication Sig Dispense Refill   acetaminophen  (TYLENOL ) 500 MG tablet Take 1,000 mg by mouth every 6 (six) hours as needed for headache or moderate pain (pain score 4-6).     Alcohol Swabs (B-D SINGLE USE SWABS REGULAR) PADS USE AS DIRECTED 100 each 0   aspirin  EC 81 MG tablet Take 81 mg by mouth daily.     Blood  Glucose Monitoring Suppl (ONETOUCH VERIO) w/Device KIT Use as instructed to test blood glucose 3 times daily. 1 kit 0   buPROPion  (WELLBUTRIN  SR) 150 MG 12 hr tablet Take 1 tablet (150 mg total) by mouth 2 (two) times daily for 3 days, THEN 1 tablet (150 mg total) 2 (two) times daily. For smoking cessation. 66 tablet 2   CALCIUM -MAGNESIUM -ZINC PO Take 1 tablet by mouth daily.     Continuous Glucose Receiver (DEXCOM G7 RECEIVER) DEVI Use to check blood glucose continuously. 1 each 0   Continuous Glucose Sensor (DEXCOM G7 SENSOR) MISC Use to check blood glucose throughout the day. Changes sensors once every 10 days. 3 each 6   cyanocobalamin  (VITAMIN B12) 1000 MCG tablet Take 1,000 mcg by mouth daily.     diclofenac  Sodium (VOLTAREN ) 1 % GEL Apply 2 g topically daily as needed (muscle pain). 150 g 1   digoxin  (LANOXIN ) 0.125 MG tablet Take 1 tablet (0.125 mg total) by mouth daily. 30 tablet 6   gabapentin  (NEURONTIN ) 300 MG capsule Take 300 mg by mouth 2 (two) times daily.     glucose blood (ONETOUCH VERIO) test strip Use as instructed to test blood glucose 3 times daily. 100 each 3   Lancets (ONETOUCH DELICA PLUS LANCET33G) MISC Use as instructed to test blood glucose 3 times daily. 100 each 3   metFORMIN  (GLUCOPHAGE -XR) 500 MG 24 hr tablet Take 1,000 mg by mouth 2 (two) times daily.     Multiple Vitamin (MULTIVITAMIN WITH MINERALS) TABS tablet Take 1 tablet by mouth daily.     nitroGLYCERIN  (NITROSTAT ) 0.4 MG SL tablet Place 1 tablet (0.4 mg total)  under the tongue every 5 (five) minutes as needed for chest pain. 25 tablet 0   Vitamin D , Cholecalciferol, 25 MCG (1000 UT) TABS Take 1,000 mcg by mouth daily.     atorvastatin  (LIPITOR) 10 MG tablet TAKE 1 TABLET ONE TIME DAILY 90 tablet 1   furosemide  (LASIX ) 40 MG tablet Take 1.5 tablets (60 mg total) by mouth 2 (two) times daily. 90 tablet 2   Insulin  Pen Needle (DROPLET PEN NEEDLES) 31G X 8 MM MISC USE WITH TOUJEO  SOLOSTAR PEN EVERY DAY 100 each 0   pantoprazole  (PROTONIX ) 40 MG tablet TAKE 1 TABLET TWICE DAILY 180 tablet 1   TOUJEO  MAX SOLOSTAR 300 UNIT/ML Solostar Pen INJECT 50 UNITS INTO THE SKIN DAILY (DOSE DECREASE) 18 mL 3   Macitentan -Tadalafil  (OPSYNVI) 10-40 MG TABS Take 1 tablet by mouth in the morning. (Patient not taking: Reported on 02/08/2024)     No facility-administered medications prior to visit.     ROS Review of Systems  Constitutional:  Positive for fatigue. Negative for activity change and appetite change.  HENT:  Negative for sinus pressure and sore throat.   Respiratory:  Positive for shortness of breath. Negative for chest tightness and wheezing.   Cardiovascular:  Negative for chest pain and palpitations.  Gastrointestinal:  Negative for abdominal distention, abdominal pain and constipation.  Genitourinary: Negative.   Musculoskeletal: Negative.   Psychiatric/Behavioral:  Negative for behavioral problems and dysphoric mood.     Objective:  BP (!) 143/73   Pulse (!) 110   Temp 98.2 F (36.8 C) (Oral)   Ht 5' 4 (1.626 m)   Wt 227 lb 3.2 oz (103.1 kg)   LMP  (LMP Unknown)   SpO2 91%   BMI 39.00 kg/m      02/08/2024    2:56 PM 02/08/2024    2:20 PM  02/02/2024    1:45 PM  BP/Weight  Systolic BP 143 158 122  Diastolic BP 73 59 70  Wt. (Lbs)  227.2 230.8  BMI  39 kg/m2 39.62 kg/m2      Physical Exam Constitutional:      Appearance: She is well-developed.  Neck:     Comments: +JVD Cardiovascular:     Rate and Rhythm: Tachycardia  present.     Heart sounds: Normal heart sounds. No murmur heard. Pulmonary:     Effort: Pulmonary effort is normal.     Breath sounds: Normal breath sounds. No wheezing or rales.  Chest:     Chest wall: No tenderness.  Abdominal:     General: Bowel sounds are normal. There is no distension.     Palpations: Abdomen is soft. There is no mass.     Tenderness: There is no abdominal tenderness.  Musculoskeletal:        General: Normal range of motion.     Right lower leg: No edema.     Left lower leg: No edema.  Neurological:     Mental Status: She is alert and oriented to person, place, and time.  Psychiatric:        Mood and Affect: Mood normal.        Latest Ref Rng & Units 02/02/2024    2:18 PM 01/29/2024    4:40 AM 01/28/2024    9:31 PM  CMP  Glucose 70 - 99 mg/dL 856  875  871   BUN 8 - 23 mg/dL 23  28  41   Creatinine 0.44 - 1.00 mg/dL 8.07  8.23  7.99   Sodium 135 - 145 mmol/L 140  140  141   Potassium 3.5 - 5.1 mmol/L 4.0  4.0  4.6   Chloride 98 - 111 mmol/L 103  104  106   CO2 22 - 32 mmol/L 23  25    Calcium  8.9 - 10.3 mg/dL 9.4  8.7      Lipid Panel     Component Value Date/Time   CHOL 161 01/21/2021 1618   TRIG 104 01/21/2021 1618   HDL 72 01/21/2021 1618   CHOLHDL 2.2 01/21/2021 1618   CHOLHDL 2.2 09/02/2016 0533   VLDL 13 09/02/2016 0533   LDLCALC 70 01/21/2021 1618    CBC    Component Value Date/Time   WBC 8.2 02/02/2024 1418   RBC 3.39 (L) 02/02/2024 1418   HGB 8.7 (L) 02/02/2024 1418   HGB 9.3 (L) 01/02/2024 1109   HCT 28.7 (L) 02/02/2024 1418   HCT 30.3 (L) 01/02/2024 1109   PLT 352 02/02/2024 1418   PLT 325 01/02/2024 1109   MCV 84.7 02/02/2024 1418   MCV 92 01/02/2024 1109   MCH 25.7 (L) 02/02/2024 1418   MCHC 30.3 02/02/2024 1418   RDW 15.9 (H) 02/02/2024 1418   RDW 14.3 01/02/2024 1109   LYMPHSABS 1.3 01/28/2024 2107   LYMPHSABS 1.6 01/02/2024 1109   MONOABS 0.5 01/28/2024 2107   EOSABS 0.1 01/28/2024 2107   EOSABS 0.1  01/02/2024 1109   BASOSABS 0.0 01/28/2024 2107   BASOSABS 0.0 01/02/2024 1109    Lab Results  Component Value Date   HGBA1C 6.1 (H) 12/11/2023       Assessment & Plan 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 - NYHA Stage IV - Send prescription for furosemide  to pharmacy for immediate pickup. -  Instruct to take furosemide  immediately with water. - Advise to inform healthcare provider if medication is unavailable to avoid hospital readmission. -Elevated blood pressure due to running out of furosemide  and discontinuation of metoprolol  - Follow-up with cardiology - Referred to nephrology but she is unsure if she has had an appointment as she is confused about visits with her multiple specialist.  Pulmonary hypertension Pulmonary hypertension medication restarted as per cardiologist's advice, though heart rate remains elevated. - Continue current medication until cardiac procedure. - Discuss medication effects and symptoms with cardiologist. - Cardiac cath appointment upcoming  Hypertension associated with stage IV chronic kidney disease secondary to type 2 diabetes mellitus - Previously referred to nephrology - Avoid nephrotoxins - Might benefit from erythropoietin replacement therapy due to anemia  Anemia Severe anemia noted, potentially contributing to increased heart rate and fatigue, with pica symptoms. -Anemia of chronic disease also contributing -It appears the cardiology office attempted to reach out with lab results but was unsuccessful.  Advised to stop by cardiology office to obtain lab results and plan - Might benefit from erythropoietin therapy   Type 2 diabetes mellitus with hypoglycemia due to insulin  therapy Morning hypoglycemia with blood sugar levels between 50-60 mg/dL. - Reduce Toujeo  dose to 46 units daily. - On the day of the cardiac procedure,  administer 25 units of Toujeo  as previously instructed.  Tachycardia - Secondary to discontinuation of metoprolol  - Follow-up with cardiology   Gastroesophageal reflux disease Continues Protonix  for acid reflux management. - Refill Protonix  prescription.      Meds ordered this encounter  Medications   atorvastatin  (LIPITOR) 10 MG tablet    Sig: Take 1 tablet (10 mg total) by mouth daily.    Dispense:  90 tablet    Refill:  1   pantoprazole  (PROTONIX ) 40 MG tablet    Sig: Take 1 tablet (40 mg total) by mouth 2 (two) times daily.    Dispense:  180 tablet    Refill:  1   insulin  glargine, 2 Unit Dial , (TOUJEO  MAX SOLOSTAR) 300 UNIT/ML Solostar Pen    Sig: Inject 46 Units into the skin at bedtime.    Dispense:  18 mL    Refill:  3   furosemide  (LASIX ) 40 MG tablet    Sig: Take 1.5 tablets (60 mg total) by mouth 2 (two) times daily.    Dispense:  90 tablet    Refill:  1    Follow-up: Return in about 3 months (around 05/10/2024).       Corrina Sabin, MD, FAAFP. [facility] [place], [place] [phone]   02/10/2024, 12:25 PM

## 2024-02-08 NOTE — Telephone Encounter (Signed)
Patient notified. Medication sent to pharmacy. 

## 2024-02-08 NOTE — Patient Instructions (Addendum)
 Please decrease your Toujeo  to 46 units daily at bedtime. I have sent your Lasix  to the [facility]

## 2024-02-09 ENCOUNTER — Telehealth: Admitting: Licensed Clinical Social Worker

## 2024-02-09 ENCOUNTER — Other Ambulatory Visit: Payer: Self-pay | Admitting: Family Medicine

## 2024-02-09 DIAGNOSIS — E1165 Type 2 diabetes mellitus with hyperglycemia: Secondary | ICD-10-CM

## 2024-02-10 ENCOUNTER — Encounter: Payer: Self-pay | Admitting: Family Medicine

## 2024-02-10 NOTE — Telephone Encounter (Signed)
 Please inform her that Lisinopril  was previously discontinued by Cardiology at her 03/24/23 visit due to AKI, hypotension  per notes. I would like her to check with them about this refill She also needs to stop Metformin  as her kidney function is abnormal and ensure she sees the Nephrologist whom I referred her to. Thanks

## 2024-02-15 ENCOUNTER — Other Ambulatory Visit: Payer: Self-pay | Admitting: Family Medicine

## 2024-02-15 NOTE — Telephone Encounter (Unsigned)
 Copied from CRM #[id number]. Topic: Clinical - Medication Refill >> Feb 15, 2024  4:13 PM Dedra B wrote: Medication: buPROPion  (WELLBUTRIN  SR) 150 MG 12 hr tablet (Expired)  Has the patient contacted their pharmacy? Yes, Newell Gower tech from [facility] is the caller.    This is the patient's preferred pharmacy:   [facility] - [place], [place] - [address] [place] [address] Phone: [phone] Fax: [phone]  Is this the correct pharmacy for this prescription? Yes   Has the prescription been filled recently? No  Is the patient out of the medication? Unsure  Has the patient been seen for an appointment in the last year OR does the patient have an upcoming appointment? Yes  Can we respond through MyChart? Yes  Agent: Please be advised that Rx refills may take up to 3 business days. We ask that you follow-up with your pharmacy.

## 2024-02-16 ENCOUNTER — Other Ambulatory Visit (HOSPITAL_COMMUNITY): Payer: Self-pay

## 2024-02-16 ENCOUNTER — Ambulatory Visit (HOSPITAL_COMMUNITY)
Admission: RE | Admit: 2024-02-16 | Discharge: 2024-02-16 | Disposition: A | Discharge status: Home / Self Care | Attending: Internal Medicine | Admitting: Internal Medicine

## 2024-02-16 ENCOUNTER — Other Ambulatory Visit: Payer: Self-pay

## 2024-02-16 ENCOUNTER — Encounter (HOSPITAL_COMMUNITY): Payer: Self-pay | Admitting: Internal Medicine

## 2024-02-16 ENCOUNTER — Encounter (HOSPITAL_COMMUNITY): Admission: RE | Disposition: A | Payer: Self-pay | Source: Home / Self Care | Attending: Internal Medicine

## 2024-02-16 ENCOUNTER — Telehealth (HOSPITAL_COMMUNITY): Payer: Self-pay

## 2024-02-16 ENCOUNTER — Telehealth (HOSPITAL_COMMUNITY): Payer: Self-pay | Admitting: *Deleted

## 2024-02-16 DIAGNOSIS — Z951 Presence of aortocoronary bypass graft: Secondary | ICD-10-CM | POA: Diagnosis not present

## 2024-02-16 DIAGNOSIS — Z794 Long term (current) use of insulin: Secondary | ICD-10-CM | POA: Insufficient documentation

## 2024-02-16 DIAGNOSIS — J449 Chronic obstructive pulmonary disease, unspecified: Secondary | ICD-10-CM | POA: Diagnosis not present

## 2024-02-16 DIAGNOSIS — G4733 Obstructive sleep apnea (adult) (pediatric): Secondary | ICD-10-CM | POA: Insufficient documentation

## 2024-02-16 DIAGNOSIS — I272 Pulmonary hypertension, unspecified: Secondary | ICD-10-CM

## 2024-02-16 DIAGNOSIS — I2781 Cor pulmonale (chronic): Secondary | ICD-10-CM | POA: Insufficient documentation

## 2024-02-16 DIAGNOSIS — F17210 Nicotine dependence, cigarettes, uncomplicated: Secondary | ICD-10-CM | POA: Diagnosis not present

## 2024-02-16 DIAGNOSIS — Z79899 Other long term (current) drug therapy: Secondary | ICD-10-CM | POA: Diagnosis not present

## 2024-02-16 DIAGNOSIS — Z7982 Long term (current) use of aspirin: Secondary | ICD-10-CM | POA: Insufficient documentation

## 2024-02-16 DIAGNOSIS — Z7985 Long-term (current) use of injectable non-insulin antidiabetic drugs: Secondary | ICD-10-CM | POA: Diagnosis not present

## 2024-02-16 DIAGNOSIS — E1122 Type 2 diabetes mellitus with diabetic chronic kidney disease: Secondary | ICD-10-CM | POA: Insufficient documentation

## 2024-02-16 DIAGNOSIS — N1832 Chronic kidney disease, stage 3b: Secondary | ICD-10-CM | POA: Insufficient documentation

## 2024-02-16 DIAGNOSIS — Z6839 Body mass index (BMI) 39.0-39.9, adult: Secondary | ICD-10-CM | POA: Diagnosis not present

## 2024-02-16 DIAGNOSIS — I2729 Other secondary pulmonary hypertension: Secondary | ICD-10-CM | POA: Diagnosis not present

## 2024-02-16 DIAGNOSIS — I130 Hypertensive heart and chronic kidney disease with heart failure and stage 1 through stage 4 chronic kidney disease, or unspecified chronic kidney disease: Secondary | ICD-10-CM | POA: Insufficient documentation

## 2024-02-16 DIAGNOSIS — I270 Primary pulmonary hypertension: Secondary | ICD-10-CM

## 2024-02-16 DIAGNOSIS — I2510 Atherosclerotic heart disease of native coronary artery without angina pectoris: Secondary | ICD-10-CM | POA: Insufficient documentation

## 2024-02-16 DIAGNOSIS — I5032 Chronic diastolic (congestive) heart failure: Secondary | ICD-10-CM | POA: Diagnosis not present

## 2024-02-16 DIAGNOSIS — Z7984 Long term (current) use of oral hypoglycemic drugs: Secondary | ICD-10-CM | POA: Insufficient documentation

## 2024-02-16 HISTORY — PX: RIGHT HEART CATH: CATH118263

## 2024-02-16 LAB — POCT I-STAT EG7
Acid-Base Excess: 0 mmol/L (ref 0.0–2.0)
Acid-Base Excess: 1 mmol/L (ref 0.0–2.0)
Acid-Base Excess: 1 mmol/L (ref 0.0–2.0)
Bicarbonate: 25.2 mmol/L (ref 20.0–28.0)
Bicarbonate: 25.9 mmol/L (ref 20.0–28.0)
Bicarbonate: 26 mmol/L (ref 20.0–28.0)
Calcium, Ion: 1.18 mmol/L (ref 1.15–1.40)
Calcium, Ion: 1.25 mmol/L (ref 1.15–1.40)
Calcium, Ion: 1.28 mmol/L (ref 1.15–1.40)
HCT: 23 % — ABNORMAL LOW (ref 36.0–46.0)
HCT: 24 % — ABNORMAL LOW (ref 36.0–46.0)
HCT: 25 % — ABNORMAL LOW (ref 36.0–46.0)
Hemoglobin: 7.8 g/dL — ABNORMAL LOW (ref 12.0–15.0)
Hemoglobin: 8.2 g/dL — ABNORMAL LOW (ref 12.0–15.0)
Hemoglobin: 8.5 g/dL — ABNORMAL LOW (ref 12.0–15.0)
O2 Saturation: 58 %
O2 Saturation: 58 %
O2 Saturation: 59 %
Potassium: 3.6 mmol/L (ref 3.5–5.1)
Potassium: 3.8 mmol/L (ref 3.5–5.1)
Potassium: 3.8 mmol/L (ref 3.5–5.1)
Sodium: 142 mmol/L (ref 135–145)
Sodium: 143 mmol/L (ref 135–145)
Sodium: 144 mmol/L (ref 135–145)
TCO2: 27 mmol/L (ref 22–32)
TCO2: 27 mmol/L (ref 22–32)
TCO2: 27 mmol/L (ref 22–32)
pCO2, Ven: 40.6 mmHg — ABNORMAL LOW (ref 44–60)
pCO2, Ven: 41.1 mmHg — ABNORMAL LOW (ref 44–60)
pCO2, Ven: 41.9 mmHg — ABNORMAL LOW (ref 44–60)
pH, Ven: 7.388 (ref 7.25–7.43)
pH, Ven: 7.408 (ref 7.25–7.43)
pH, Ven: 7.413 (ref 7.25–7.43)
pO2, Ven: 30 mmHg — CL (ref 32–45)
pO2, Ven: 30 mmHg — CL (ref 32–45)
pO2, Ven: 31 mmHg — CL (ref 32–45)

## 2024-02-16 LAB — GLUCOSE, CAPILLARY
Glucose-Capillary: 110 mg/dL — ABNORMAL HIGH (ref 70–99)
Glucose-Capillary: 69 mg/dL — ABNORMAL LOW (ref 70–99)
Glucose-Capillary: 101 mg/dL — ABNORMAL HIGH (ref 70–99)

## 2024-02-16 SURGERY — RIGHT HEART CATH
Anesthesia: LOCAL

## 2024-02-16 MED ORDER — SODIUM CHLORIDE 0.9% FLUSH: 3.0000 mL | Freq: Two times a day (BID) | INTRAVENOUS | Status: DC

## 2024-02-16 MED ORDER — HEPARIN (PORCINE) IN NACL 1000-0.9 UT/500ML-% IV SOLN
INTRAVENOUS | Status: DC | PRN
  Administered 2024-02-16: 500 mL

## 2024-02-16 MED ORDER — LABETALOL HCL 5 MG/ML IV SOLN: 10.0000 mg | INTRAVENOUS | Status: DC | PRN

## 2024-02-16 MED ORDER — ONDANSETRON HCL 4 MG/2ML IJ SOLN: 4.0000 mg | Freq: Four times a day (QID) | INTRAMUSCULAR | Status: DC | PRN

## 2024-02-16 MED ORDER — SODIUM CHLORIDE 0.9% FLUSH
3.0000 mL | INTRAVENOUS | Status: DC | PRN
Start: 2024-02-16 — End: 2024-02-16

## 2024-02-16 MED ORDER — FREE WATER: 250.0000 mL | Freq: Once | Status: DC

## 2024-02-16 MED ORDER — LIDOCAINE HCL (PF) 1 % IJ SOLN
INTRAMUSCULAR | Status: DC | PRN
  Administered 2024-02-16: 5 mL via INTRADERMAL

## 2024-02-16 MED ORDER — HYDRALAZINE HCL 20 MG/ML IJ SOLN
10.0000 mg | INTRAMUSCULAR | Status: DC | PRN
Start: 2024-02-16 — End: 2024-02-16

## 2024-02-16 MED ORDER — ACETAMINOPHEN 325 MG PO TABS: 650.0000 mg | ORAL_TABLET | ORAL | Status: DC | PRN

## 2024-02-16 MED ORDER — SODIUM CHLORIDE 0.9 % IV SOLN: 250.0000 mL | INTRAVENOUS | Status: DC | PRN

## 2024-02-16 MED ORDER — SODIUM CHLORIDE 0.9 % IV SOLN
250.0000 mL | INTRAVENOUS | Status: DC | PRN
Start: 2024-02-16 — End: 2024-02-16

## 2024-02-16 MED ORDER — SODIUM CHLORIDE 0.9% FLUSH: 3.0000 mL | INTRAVENOUS | Status: DC | PRN

## 2024-02-16 SURGICAL SUPPLY — 5 items
CATH SWAN GANZ 7F STRAIGHT (CATHETERS) IMPLANT
GLIDESHEATH SLENDER 7FR .021G (SHEATH) IMPLANT
PACK CARDIAC CATHETERIZATION (CUSTOM PROCEDURE TRAY) ×2 IMPLANT
TRANSDUCER W/STOPCOCK (MISCELLANEOUS) IMPLANT
TUBING ART PRESS 72 MALE/FEM (TUBING) IMPLANT

## 2024-02-16 NOTE — Telephone Encounter (Signed)
-----   Message from Toribio Fuel sent at 02/16/2024 10:52 AM EDT ----- Deborah Coffey and nason  Can we   1) refer Deborah Coffey to Pulmonary rehab for [facility] 2) start working on getting her sotatercept  Thanks -dan

## 2024-02-16 NOTE — Interval H&P Note (Signed)
 History and Physical Interval Note:  02/16/2024 10:01 AM  Deborah Coffey  has presented today for surgery, with the diagnosis of hp.  The various methods of treatment have been discussed with the patient and family. After consideration of risks, benefits and other options for treatment, the patient has consented to  Procedure(s): RIGHT HEART CATH (N/A) as a surgical intervention.  The patient's history has been reviewed, patient examined, no change in status, stable for surgery.  I have reviewed the patient's chart and labs.  Questions were answered to the patient's satisfaction.     Deborah Coffey

## 2024-02-16 NOTE — Telephone Encounter (Signed)
 Ref entered for pulm rehab, pharmacy team is working on auth for sotatercept

## 2024-02-16 NOTE — Discharge Instructions (Signed)

## 2024-02-16 NOTE — Progress Notes (Signed)
 Patient was given discharge instructions. She verbalized understanding.

## 2024-02-17 ENCOUNTER — Telehealth (HOSPITAL_COMMUNITY): Payer: Self-pay

## 2024-02-17 ENCOUNTER — Encounter (HOSPITAL_COMMUNITY): Payer: Self-pay

## 2024-02-17 ENCOUNTER — Other Ambulatory Visit (HOSPITAL_COMMUNITY): Payer: Self-pay

## 2024-02-17 MED ORDER — BUPROPION HCL ER (SR) 150 MG PO TB12: 150.0000 mg | ORAL_TABLET | Freq: Two times a day (BID) | ORAL | 1 refills | Status: DC

## 2024-02-17 NOTE — Telephone Encounter (Signed)
 Advanced Heart Failure Patient Advocate Encounter  Prior authorization for Imelda has been submitted and approved. Test billing returns $0 for 21 day supply.  Key: [id number] Effective: 04/20/2023 to 04/18/2025  Rachel DEL, CPhT Rx Patient Advocate Phone: [phone]

## 2024-02-17 NOTE — Telephone Encounter (Signed)
 Advanced Heart Failure Patient Advocate Encounter  Contacted patient for consent to submit enrollment forms for [employer]. Patient will contact me with appropriate mailing address when she is able.  Rachel DEL, CPhT Rx Patient Advocate Phone: [phone]

## 2024-02-17 NOTE — Telephone Encounter (Signed)
 Requested medications are due for refill today.  unsure  Requested medications are on the active medications list.  yes  Last refill. 01/06/2024 #[phone] rf  Future visit scheduled.   yes  Notes to clinic.  Rx written to expire 02/16/2024.    Requested Prescriptions  Pending Prescriptions Disp Refills   buPROPion  (WELLBUTRIN  SR) 150 MG 12 hr tablet 66 tablet 2    Sig: Take 1 tablet (150 mg total) by mouth 2 (two) times daily for 3 days, THEN 1 tablet (150 mg total) 2 (two) times daily. For smoking cessation.     Psychiatry: Antidepressants - bupropion  Failed - 02/17/2024  1:40 PM      Failed - Cr in normal range and within 360 days    Creat  Date Value Ref Range Status  02/27/2015 1.02 0.50 - 1.05 mg/dL Final   Creatinine, Ser  Date Value Ref Range Status  02/02/2024 1.92 (H) 0.44 - 1.00 mg/dL Final   Creatinine, Urine  Date Value Ref Range Status  06/26/2015 115 20 - 320 mg/dL Final         Failed - AST in normal range and within 360 days    AST  Date Value Ref Range Status  01/28/2024 51 (H) 15 - 41 U/L Final    Comment:    HEMOLYSIS AT THIS LEVEL MAY AFFECT RESULT         Passed - ALT in normal range and within 360 days    ALT  Date Value Ref Range Status  01/28/2024 17 0 - 44 U/L Final    Comment:    HEMOLYSIS AT THIS LEVEL MAY AFFECT RESULT         Passed - Completed PHQ-2 or PHQ-9 in the last 360 days      Passed - Last BP in normal range    BP Readings from Last 1 Encounters:  02/16/24 130/60         Passed - Valid encounter within last 6 months    Recent Outpatient Visits           1 week ago Benign hypertensive heart and kidney disease with chronic kidney disease, stage 1 through stage 4 or unspecified chronic kidney disease, with heart failure (HCC)   [facility] [place]. [facility] [name], MD   1 month ago Anemia, unspecified type   [facility] [place]. [facility] [name], MD   6 months ago Type 2 diabetes mellitus with other circulatory complication, with long-term current use of insulin  [facility])   [facility] [place]. [facility] [name], MD   9 months ago Type 2 diabetes mellitus with other circulatory complication, with long-term current use of insulin  [facility])   [facility] [place]. [facility] [name], MD   1 year ago Type 2 diabetes mellitus with other circulatory complication, with long-term current use of insulin  [facility])   [facility] [place]. [facility] [name], MD

## 2024-02-21 ENCOUNTER — Telehealth (HOSPITAL_COMMUNITY): Payer: Self-pay

## 2024-02-21 NOTE — Telephone Encounter (Signed)
 Pt returned phone call and is interested in the pulmonary rehab program. Will pass to nurse navigator for review.

## 2024-02-21 NOTE — Telephone Encounter (Signed)
 Pt insurance is active and benefits verified through [facility] Co-pay $25, DED 0/0 met, out of pocket $9,350/$1,464 met, co-insurance 0%. no pre-authorization required, Susie/[employer] 02/21/2024@9 :44, REF# [id number]  No visit limit for Pulmonary Rehab

## 2024-02-22 ENCOUNTER — Telehealth (HOSPITAL_COMMUNITY): Payer: Self-pay

## 2024-02-22 NOTE — Telephone Encounter (Signed)
 Spoke with patient by phone, pt will call me back with address for delivery / enrollment forms.

## 2024-02-22 NOTE — Telephone Encounter (Signed)
 Attempted to call patient to schedule pulmonary rehab- no answer, left message. Sent MyChart message.

## 2024-02-29 ENCOUNTER — Telehealth (HOSPITAL_COMMUNITY): Payer: Self-pay

## 2024-02-29 NOTE — Progress Notes (Signed)
 ADVANCED HF CLINIC NOTE  PCP: [name], MD Primary Cardiologist: Oneil Parchment, MD HF Cardiologist: Dr. Cherrie  HPI: Deborah Coffey is a 68 y.o. with CAD s/p CABG x1 (LIMA-LAD) 2005, tobacco use, chronic diastolic CHF, anemia, depression, HLD, HTN, DM, CKD stage 3b, prior TIAs, pulmonary HTN (felt due to underlying lung disease/smoking), and morbid obesity.    Echo12/22 EF 65-70% RV moderately dilated with moderate HK. Flattened septum Moderate TR RVSP est 110 mmHG    Chest CT scan 2/22. No PE   R/L cath on 04/16/21: - mLAD 75% LIMA to LAD is widely patent. pRCA 50% - EF 65% - RA 5 PA 79/18 (39) PCW 12  CO 6.4 L/min; CI PVR 4.2  Sleep study 06/2021 only c/w mild OSA AHI 5.5   Echo 11/24 EF 60-65%, IVC flattened in systole and diastole, c/w RV pressure and volume overload.  RV severely enlarged w/ severely reduced systolic function. The estimated RVSP is 92.5 mmHg (comparred to 111 mmHg previous study). RA severely dilated. Moderate TR    PFTs 12/24: FEV1 1.27 (50%) FVC 1.84 (55),ratio 77, DLCOcor 48. Moderately severe restriction with reversibility (12% change) and severe diffusion defect. She is being followed by [facility] (Dr. Brenna). He has also being following her for pulmonary nodules.   Echo 11/24 EF 60-65% RV mod reduced RVSP 92   RHC 3/25 demonstrated severe PAH w/ cor pulmonale, suspect Group 1 and possibly Group 3. She was started on Sildenafil  20 tid.  RA = 4, RV = 77/6, PA =  78/19 (42), PCW = 6, Fick CO/CI = 3.9/1.9, Thermo CO/CI = 3.6/1.7, PVR = 9.3 (Fick) 10.0 (TD), Ao sat = 96%, PA sat = 57%, 58%, PAPi = 14.8]  10/2023 Placed on Opsynvi  Admitted with 8/25 A/C HFpEF. Echo LVEF 60-65%, septal flattening in systole and diastole, RV severely enlarged and moderately HK, RVSP 133 mmHg, RA severe dilated, severe TR, dilated IVC. Diuresed with IV lasix  and transitioned to lasix  60 mg twice a day. Digoxin  added. At discharge Opsynvi restarted.    Admitted 10/25 for chest and abdominal pan felt to be related to anxiety attack. Cardiac workup was unremarkable, although possible that this was caused by pHTN after explanation from patient at office visit today. CT dissection negative.  She returns today for heart failure follow up. Overall feeling poor. NYHA IIIb. Reports dyspnea and fatigue. Denies chest pain, orthopnea, dizziness, and abnormal bleeding. Appetite okay. Compliant with all medications, except opsynvi, she feels that this has not helped her and is hurting her.  Past Medical History:  Diagnosis Date   Allergy    Anemia    CAD in native artery 02/18/2004   Mid LAD lesoin just after Large D1 --> CABG x 1 LIMA-LAD   CAP (community acquired pneumonia) 08/07/2014   CHF (congestive heart failure) (HCC)    Chronic kidney disease, stage 3b (HCC)    Depression    High cholesterol    History of blood transfusion 04/20/2003   related to OHS    Hypertension    Lung nodules    Mild carotid artery disease    Pulmonary hypertension (HCC)    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 competitive flow   TIA (transient ischemic attack)    Type II diabetes mellitus (HCC)    Current Outpatient Medications  Medication Sig Dispense Refill   acetaminophen  (TYLENOL ) 500 MG tablet Take 1,000 mg by mouth  every 6 (six) hours as needed for headache or moderate pain (pain score 4-6).     Alcohol Swabs (B-D SINGLE USE SWABS REGULAR) PADS USE AS DIRECTED 100 each 0   aspirin  EC 81 MG tablet Take 81 mg by mouth daily.     atorvastatin  (LIPITOR) 10 MG tablet Take 1 tablet (10 mg total) by mouth daily. 90 tablet 1   Blood Glucose Monitoring Suppl (ONETOUCH VERIO) w/Device KIT Use as instructed to test blood glucose 3 times daily. 1 kit 0   buPROPion  (WELLBUTRIN  SR) 150 MG 12 hr tablet Take 1 tablet (150 mg total) by mouth 2 (two) times daily. For smoking cessation 180 tablet 1    CALCIUM -MAGNESIUM -ZINC PO Take 1 tablet by mouth daily.     Continuous Glucose Receiver (DEXCOM G7 RECEIVER) DEVI Use to check blood glucose continuously. 1 each 0   Continuous Glucose Sensor (DEXCOM G7 SENSOR) MISC Use to check blood glucose throughout the day. Changes sensors once every 10 days. 3 each 6   cyanocobalamin  (VITAMIN B12) 1000 MCG tablet Take 1,000 mcg by mouth daily.     diclofenac  Sodium (VOLTAREN ) 1 % GEL Apply 2 g topically daily as needed (muscle pain). 150 g 1   DROPLET PEN NEEDLES 31G X 8 MM MISC USE WITH TOUJEO  SOLOSTAR PEN EVERY DAY 100 each 3   furosemide  (LASIX ) 40 MG tablet Take 1.5 tablets (60 mg total) by mouth 2 (two) times daily. 90 tablet 1   gabapentin  (NEURONTIN ) 300 MG capsule Take 300 mg by mouth 2 (two) times daily.     glucose blood (ONETOUCH VERIO) test strip Use as instructed to test blood glucose 3 times daily. 100 each 3   insulin  glargine, 2 Unit Dial , (TOUJEO  MAX SOLOSTAR) 300 UNIT/ML Solostar Pen Inject 46 Units into the skin at bedtime. 18 mL 3   Lancets (ONETOUCH DELICA PLUS LANCET33G) MISC Use as instructed to test blood glucose 3 times daily. 100 each 3   metFORMIN  (GLUCOPHAGE -XR) 500 MG 24 hr tablet Take 1,000 mg by mouth 2 (two) times daily.     Multiple Vitamin (MULTIVITAMIN WITH MINERALS) TABS tablet Take 1 tablet by mouth daily.     nitroGLYCERIN  (NITROSTAT ) 0.4 MG SL tablet Place 1 tablet (0.4 mg total) under the tongue every 5 (five) minutes as needed for chest pain. 25 tablet 0   pantoprazole  (PROTONIX ) 40 MG tablet Take 1 tablet (40 mg total) by mouth 2 (two) times daily. 180 tablet 1   Vitamin D , Cholecalciferol, 25 MCG (1000 UT) TABS Take 1,000 mcg by mouth daily.     digoxin  (LANOXIN ) 0.125 MG tablet Take 1 tablet (0.125 mg total) by mouth daily. (Patient not taking: Reported on 03/01/2024) 30 tablet 6   FARXIGA  10 MG TABS tablet Take 10 mg by mouth daily.     Macitentan -Tadalafil  (OPSYNVI) 10-40 MG TABS Take 1 tablet by mouth in the  morning. (Patient not taking: Reported on 03/01/2024)     OZEMPIC , 2 MG/DOSE, 8 MG/3ML SOPN  (Patient not taking: Reported on 03/01/2024)     No current facility-administered medications for this encounter.   Allergies  Allergen Reactions   Ciprofloxacin Rash   Social History   Socioeconomic History   Marital status: Divorced    Spouse name: Not on file   Number of children: Not on file   Years of education: 12   Highest education level: Some college, no degree  Occupational History   Occupation: Disabled  Tobacco Use   Smoking  status: Every Day    Current packs/day: 0.25    Average packs/day: 0.5 packs/day for 50.9 years (25.2 ttl pk-yrs)    Types: Cigarettes    Start date: 32   Smokeless tobacco: Current   Tobacco comments:    10 Cigarettes a day. 08/22/2023  Vaping Use   Vaping status: Some Days   Substances: Flavoring  Substance and Sexual Activity   Alcohol use: Not Currently   Drug use: No    Types: Marijuana    Comment: smoked pot years ago but not currently   Sexual activity: Not Currently  Other Topics Concern   Not on file  Social History Narrative   Lives with daughter and 3 grandchildren   Social Drivers of Health   Financial Resource Strain: Low Risk  (08/23/2023)   Overall Financial Resource Strain (CARDIA)    Difficulty of Paying Living Expenses: Not hard at all  Food Insecurity: No Food Insecurity (01/29/2024)   Hunger Vital Sign    Worried About Running Out of Food in the Last Year: Never true    Ran Out of Food in the Last Year: Never true  Transportation Needs: No Transportation Needs (01/29/2024)   PRAPARE - Administrator, Civil Service (Medical): No    Lack of Transportation (Non-Medical): No  Physical Activity: Inactive (08/23/2023)   Exercise Vital Sign    Days of Exercise per Week: 0 days    Minutes of Exercise per Session: 0 min  Stress: No Stress Concern Present (08/23/2023)   [employer] of Occupational Health -  Occupational Stress Questionnaire    Feeling of Stress : Not at all  Social Connections: Moderately Integrated (01/29/2024)   Social Connection and Isolation Panel    Frequency of Communication with Friends and Family: More than three times a week    Frequency of Social Gatherings with Friends and Family: More than three times a week    Attends Religious Services: More than 4 times per year    Active Member of [employer] or Organizations: Yes    Attends Engineer, Structural: More than 4 times per year    Marital Status: Divorced  Intimate Partner Violence: Not At Risk (01/29/2024)   Humiliation, Afraid, Rape, and Kick questionnaire    Fear of Current or Ex-Partner: No    Emotionally Abused: No    Physically Abused: No    Sexually Abused: No   Family History  Problem Relation Age of Onset   Heart disease Mother    Diabetes Mother    Cancer Mother        cervical   Diabetes Father    Diabetes Sister    Heart disease Sister    Lupus Sister    Heart failure Sister    Hepatitis C Sister    Hypertension Daughter    Diabetes Brother    Heart attack Brother    Heart failure Brother    Diabetes Brother    Colon cancer Neg Hx    Breast cancer Neg Hx    BP 128/70   Pulse (!) 106   Ht 5' 4 (1.626 m)   Wt 100.7 kg (222 lb)   LMP  (LMP Unknown)   SpO2 94%   BMI 38.11 kg/m   Wt Readings from Last 3 Encounters:  03/01/24 100.7 kg (222 lb)  02/16/24 101.6 kg (224 lb)  02/08/24 103.1 kg (227 lb 3.2 oz)   PHYSICAL EXAM: General: Well appearing. No distress. Arrived by [facility] Cardiac:  JVP ~8cm cm. No murmurs  Resp: Lung sounds clear and equal B/L Extremities: Warm and dry.  No peripheral edema.  Neuro: A&O x3. Affect pleasant.   ECG (personally reviewed): ST 112 bpm with a PVC   ASSESSMENT & PLAN:  1. Pulmonary HTN with RV failure/cor pulmonale - Echo (12/22): EF 65-70% RV moderately dilated with moderate HK, flattened septum, moderate TR, RVSP est 110 mmHG   - Chest CT  scan (2/22): No PE - RHC (12/22): RA 5 PA 79/18 (39) PCW 12  CO 6.4 L/min; CI PVR 4.2 - PFTs with significant restrictive/obstructive lung disease - ANA, ANCA, [place]-70, RF all negative. - Sleep study with mild OSA.  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 She was started on Sildenafil  20 tid. She was later transitioned to Opsynvi - Most recent echo with EF 60-65%, mod reduced RV, RVSP 91.2 mmHg, severe TR - Repeated RHC due to worsening symptoms. RA 9, PA 74/25 (46), PCW 8, TD CO/CI 6.7/3.4 - Progressive NYHA IIIb symptoms. Appears relatively dry.  - continue lasix  60 mg bid  - off Opsynvi, not taking because she feels like it is not helping. Does not want to restart - would like to start sotatercept, however struggling to find a place to mail medication; only has PO box - Stop Farxiga  with recent UTI - Previous sleep study with just mild OSA. Follows with Pulmonary - Refer to pulmonary rehab - needs to call to schedule, number placed in AVS  2. CAD - CABG 2005 LIMA-> LAD - Cath (04/16/21): mLAD 75% LIMA to LAD is widely patent. pRCA 50 - Continue ASA/statin.  3. Morbid obesity - Body mass index is 38.11 kg/m.. - Off Ozempic  - Discussed weight loss as an important part of treatment  4. OSA - Mild on sleep study. Not on CPAP - Has follow up with Dr Annella.   5. HTN - Blood pressure well controlled. Continue current regimen.  6. Smoking - Discussed need for smoking cessation  7. Recent UTI - treated with antibiotics; completed  Follow up in 2 month with APP  [place]a Ponder, NP  3:43 PM

## 2024-02-29 NOTE — Telephone Encounter (Signed)
 Called to confirm/remind patient of their appointment at the [facility] on 03/01/24.   Appointment:   [] Confirmed  [x] Left mess   [] No answer/No voice mail  [] VM Full/unable to leave message  [] Phone not in service  And to bring in all medications and/or complete list.

## 2024-03-01 ENCOUNTER — Encounter (HOSPITAL_COMMUNITY): Payer: Self-pay

## 2024-03-01 ENCOUNTER — Other Ambulatory Visit: Payer: Self-pay

## 2024-03-01 ENCOUNTER — Ambulatory Visit (HOSPITAL_COMMUNITY)
Admission: RE | Admit: 2024-03-01 | Discharge: 2024-03-01 | Disposition: A | Discharge status: Home / Self Care | Source: Ambulatory Visit | Attending: Cardiology | Admitting: Cardiology

## 2024-03-01 VITALS — BP 128/70 | HR 106 | Ht 64.0 in | Wt 222.0 lb

## 2024-03-01 DIAGNOSIS — Z7984 Long term (current) use of oral hypoglycemic drugs: Secondary | ICD-10-CM | POA: Diagnosis not present

## 2024-03-01 DIAGNOSIS — I10 Essential (primary) hypertension: Secondary | ICD-10-CM | POA: Diagnosis not present

## 2024-03-01 DIAGNOSIS — N1832 Chronic kidney disease, stage 3b: Secondary | ICD-10-CM | POA: Diagnosis not present

## 2024-03-01 DIAGNOSIS — D631 Anemia in chronic kidney disease: Secondary | ICD-10-CM | POA: Diagnosis not present

## 2024-03-01 DIAGNOSIS — Z951 Presence of aortocoronary bypass graft: Secondary | ICD-10-CM | POA: Insufficient documentation

## 2024-03-01 DIAGNOSIS — I130 Hypertensive heart and chronic kidney disease with heart failure and stage 1 through stage 4 chronic kidney disease, or unspecified chronic kidney disease: Secondary | ICD-10-CM | POA: Diagnosis not present

## 2024-03-01 DIAGNOSIS — I50812 Chronic right heart failure: Secondary | ICD-10-CM | POA: Diagnosis not present

## 2024-03-01 DIAGNOSIS — Z794 Long term (current) use of insulin: Secondary | ICD-10-CM | POA: Diagnosis not present

## 2024-03-01 DIAGNOSIS — I2510 Atherosclerotic heart disease of native coronary artery without angina pectoris: Secondary | ICD-10-CM | POA: Diagnosis not present

## 2024-03-01 DIAGNOSIS — Z6838 Body mass index (BMI) 38.0-38.9, adult: Secondary | ICD-10-CM | POA: Diagnosis not present

## 2024-03-01 DIAGNOSIS — I50810 Right heart failure, unspecified: Secondary | ICD-10-CM | POA: Diagnosis not present

## 2024-03-01 DIAGNOSIS — F32A Depression, unspecified: Secondary | ICD-10-CM | POA: Insufficient documentation

## 2024-03-01 DIAGNOSIS — Z79899 Other long term (current) drug therapy: Secondary | ICD-10-CM | POA: Diagnosis not present

## 2024-03-01 DIAGNOSIS — Z7982 Long term (current) use of aspirin: Secondary | ICD-10-CM | POA: Insufficient documentation

## 2024-03-01 DIAGNOSIS — I272 Pulmonary hypertension, unspecified: Secondary | ICD-10-CM | POA: Diagnosis not present

## 2024-03-01 DIAGNOSIS — I5032 Chronic diastolic (congestive) heart failure: Secondary | ICD-10-CM | POA: Diagnosis not present

## 2024-03-01 DIAGNOSIS — Z87440 Personal history of urinary (tract) infections: Secondary | ICD-10-CM | POA: Insufficient documentation

## 2024-03-01 DIAGNOSIS — G4733 Obstructive sleep apnea (adult) (pediatric): Secondary | ICD-10-CM | POA: Diagnosis not present

## 2024-03-01 DIAGNOSIS — E66812 Obesity, class 2: Secondary | ICD-10-CM | POA: Diagnosis not present

## 2024-03-01 DIAGNOSIS — I2722 Pulmonary hypertension due to left heart disease: Secondary | ICD-10-CM | POA: Insufficient documentation

## 2024-03-01 DIAGNOSIS — F17210 Nicotine dependence, cigarettes, uncomplicated: Secondary | ICD-10-CM | POA: Insufficient documentation

## 2024-03-01 DIAGNOSIS — E1122 Type 2 diabetes mellitus with diabetic chronic kidney disease: Secondary | ICD-10-CM | POA: Diagnosis not present

## 2024-03-01 DIAGNOSIS — E785 Hyperlipidemia, unspecified: Secondary | ICD-10-CM | POA: Diagnosis not present

## 2024-03-01 DIAGNOSIS — I361 Nonrheumatic tricuspid (valve) insufficiency: Secondary | ICD-10-CM | POA: Insufficient documentation

## 2024-03-01 DIAGNOSIS — Z8673 Personal history of transient ischemic attack (TIA), and cerebral infarction without residual deficits: Secondary | ICD-10-CM | POA: Insufficient documentation

## 2024-03-01 LAB — BASIC METABOLIC PANEL WITH GFR
Anion gap: 15 (ref 5–15)
BUN: 27 mg/dL — ABNORMAL HIGH (ref 8–23)
CO2: 22 mmol/L (ref 22–32)
Calcium: 9.3 mg/dL (ref 8.9–10.3)
Chloride: 104 mmol/L (ref 98–111)
Creatinine, Ser: 1.92 mg/dL — ABNORMAL HIGH (ref 0.44–1.00)
GFR, Estimated: 28 mL/min — ABNORMAL LOW (ref >60)
Glucose, Bld: 326 mg/dL — ABNORMAL HIGH (ref 70–99)
Potassium: 4.4 mmol/L (ref 3.5–5.1)
Sodium: 141 mmol/L (ref 135–145)

## 2024-03-01 LAB — BRAIN NATRIURETIC PEPTIDE: B Natriuretic Peptide: 1434.1 pg/mL — ABNORMAL HIGH (ref 0.0–100.0)

## 2024-03-01 NOTE — Telephone Encounter (Signed)
 Left voicemail for patient

## 2024-03-01 NOTE — Patient Instructions (Addendum)
 Good to see you today!   Pulmonary rehab scheduling [phone]  Labs done today, your results will be available in MyChart, we will contact you for abnormal readings.  Your physician recommends that you schedule a follow-up appointment   If you have any questions or concerns before your next appointment please send us  a message through [facility] or call our office at [phone].    TO LEAVE A MESSAGE FOR THE NURSE SELECT OPTION 2, PLEASE LEAVE A MESSAGE INCLUDING: YOUR NAME DATE OF BIRTH CALL BACK NUMBER REASON FOR CALL**this is important as we prioritize the call backs  YOU WILL RECEIVE A CALL BACK THE SAME DAY AS LONG AS YOU CALL BEFORE 4:00 PM At the [facility], you and your health needs are our priority. As part of our continuing mission to provide you with exceptional heart care, we have created designated Provider Care Teams. These Care Teams include your primary Cardiologist (physician) and Advanced Practice Providers (APPs- Physician Assistants and Nurse Practitioners) who all work together to provide you with the care you need, when you need it.   You may see any of the following providers on your designated Care Team at your next follow up: Dr Toribio Fuel Dr Ezra Shuck Dr. Morene Brownie Greig Mosses, NP Caffie Shed, [place] [facility], [place] Beckey Coe, NP Jordan Lee, NP Ellouise Class, NP Tinnie Redman, PharmD Jaun Bash, PharmD   Please be sure to bring in all your medications bottles to every appointment.    Thank you for choosing [facility]

## 2024-03-02 ENCOUNTER — Encounter (HOSPITAL_COMMUNITY): Payer: Self-pay

## 2024-03-02 ENCOUNTER — Ambulatory Visit (HOSPITAL_COMMUNITY): Payer: Self-pay | Admitting: Cardiology

## 2024-03-02 ENCOUNTER — Other Ambulatory Visit: Payer: Self-pay | Admitting: Family Medicine

## 2024-03-02 DIAGNOSIS — I5032 Chronic diastolic (congestive) heart failure: Secondary | ICD-10-CM

## 2024-03-02 DIAGNOSIS — Z794 Long term (current) use of insulin: Secondary | ICD-10-CM

## 2024-03-05 ENCOUNTER — Telehealth (HOSPITAL_COMMUNITY): Payer: Self-pay | Admitting: Pharmacist

## 2024-03-05 NOTE — Telephone Encounter (Signed)
 Patient reports she does not have an address for shipping or a fridge at home, making it impossible to receive at home. Discussed alternatives like shipping to family or friends and picking up from there, but she reports there has none that will do this for her. The only remaining option is to have [facility] and stored at a local store, however the from does not allow for this. Attempted to call the rep x 3 to explore this option with no response thus far.

## 2024-03-06 ENCOUNTER — Telehealth (HOSPITAL_COMMUNITY): Payer: Self-pay

## 2024-03-06 NOTE — Telephone Encounter (Signed)
 Patient called requesting to reschedule PR orientation from tomorrow 11/19 to 11/26 at 1:00pm. Appointment has been rescheduled to next week.

## 2024-03-06 NOTE — Telephone Encounter (Signed)
 [facility] Patient is eligible to [facility] a [facility]. Patient advocate will send in appropriate forms for enrollment.

## 2024-03-07 ENCOUNTER — Encounter (HOSPITAL_COMMUNITY)

## 2024-03-07 NOTE — Telephone Encounter (Signed)
 Pharmacist has confirmed that [facility] be able to [facility] a [facility] location for pickup. I have confirmed with [employer] that it will be okay to use office address as patient address if needed. Need to confirm with patient and see which [facility] location would be best for delivery. Left voicemail

## 2024-03-12 ENCOUNTER — Other Ambulatory Visit (HOSPITAL_COMMUNITY): Payer: Self-pay

## 2024-03-12 NOTE — Telephone Encounter (Signed)
 [facility] with [facility], added to [facility] pharmacy list. New start forms for [facility] faxed to [employer] on 03/12/2024

## 2024-03-13 ENCOUNTER — Telehealth (HOSPITAL_COMMUNITY): Payer: Self-pay

## 2024-03-13 NOTE — Telephone Encounter (Signed)
 Called pt to confirm her PR appointment. No answer. Left VM.

## 2024-03-14 ENCOUNTER — Encounter (HOSPITAL_COMMUNITY): Admission: RE | Admit: 2024-03-14 | Source: Ambulatory Visit

## 2024-03-14 NOTE — Telephone Encounter (Signed)
 Deborah Coffey has been returned, and rx has been triaged [facility]

## 2024-03-16 ENCOUNTER — Telehealth: Admitting: Licensed Clinical Social Worker

## 2024-03-19 ENCOUNTER — Telehealth (HOSPITAL_COMMUNITY): Payer: Self-pay

## 2024-03-19 ENCOUNTER — Ambulatory Visit (HOSPITAL_COMMUNITY)

## 2024-03-19 NOTE — Telephone Encounter (Signed)
 Called pt to reschedule her PR orientation. No answer. Left VM.

## 2024-03-20 ENCOUNTER — Encounter (HOSPITAL_COMMUNITY)

## 2024-03-21 ENCOUNTER — Telehealth (HOSPITAL_COMMUNITY): Payer: Self-pay

## 2024-03-21 ENCOUNTER — Ambulatory Visit (HOSPITAL_COMMUNITY)

## 2024-03-21 NOTE — Telephone Encounter (Signed)
 Augustin has attempted to reach patient to reschedule on 11/25 and 12/01, left messages. No response from patient.  Closing referral.

## 2024-03-21 NOTE — Telephone Encounter (Signed)
 [facility]oke with [facility] to confirm 0.6 mL initial dose, 1.4 mL maintenance dose. Rx is being processed to be delivered.

## 2024-03-22 ENCOUNTER — Encounter (HOSPITAL_COMMUNITY)

## 2024-03-27 ENCOUNTER — Encounter (HOSPITAL_COMMUNITY)

## 2024-03-28 ENCOUNTER — Ambulatory Visit (HOSPITAL_COMMUNITY)

## 2024-03-28 ENCOUNTER — Ambulatory Visit: Admitting: Pulmonary Disease

## 2024-03-29 ENCOUNTER — Encounter (HOSPITAL_COMMUNITY)

## 2024-03-30 ENCOUNTER — Telehealth: Payer: Self-pay | Admitting: Licensed Clinical Social Worker

## 2024-03-30 ENCOUNTER — Encounter: Payer: Self-pay | Admitting: Licensed Clinical Social Worker

## 2024-03-30 NOTE — Patient Instructions (Signed)
 Deborah Coffey - I am sorry I was unable to reach you today for our scheduled appointment. I work with Newlin, Enobong, MD and am calling to support your healthcare needs. Please contact me at [phone] at your earliest convenience. I look forward to speaking with you soon.   Thank you,  Rolin Kerns, LCSW [place]  [facility], [facility] Clinical Social Worker Direct Dial : [phone]  Fax: [phone] Website: [email or web address] 9:48 AM

## 2024-04-03 ENCOUNTER — Encounter (HOSPITAL_COMMUNITY)

## 2024-04-04 ENCOUNTER — Other Ambulatory Visit (HOSPITAL_COMMUNITY): Payer: Self-pay

## 2024-04-04 ENCOUNTER — Ambulatory Visit (HOSPITAL_COMMUNITY)

## 2024-04-04 DIAGNOSIS — I5032 Chronic diastolic (congestive) heart failure: Secondary | ICD-10-CM

## 2024-04-05 ENCOUNTER — Encounter (HOSPITAL_COMMUNITY)

## 2024-04-09 ENCOUNTER — Telehealth: Payer: Self-pay

## 2024-04-09 NOTE — Progress Notes (Signed)
 Complex Care Management Care Guide Note  04/09/2024 Name: Daijha Leggio MRN: [id number] DOB: 02/17/1956  Klara Stjames is a 68 y.o. year old female who is a primary care patient of Newlin, Enobong, MD and is actively engaged with the care management team. I reached out to Othel Piety by phone today to assist with re-scheduling  with the Licensed Clinical Child Psychotherapist.  Follow up plan: Unsuccessful telephone outreach attempt made. A HIPAA compliant phone message was left for the patient providing contact information and requesting a return call.  Leotis Rase [facility], [facility] Guide  Direct Dial : [phone]  Fax [phone]

## 2024-04-10 ENCOUNTER — Encounter (HOSPITAL_COMMUNITY)

## 2024-04-11 NOTE — Progress Notes (Signed)
 Complex Care Management Care Guide Note  04/11/2024 Name: Jazmon Kos MRN: [id number] DOB: [birth year]  Adenike Shidler is a 68 y.o. year old female who is a primary care patient of Newlin, Enobong, MD and is actively engaged with the care management team. I reached out to Othel Piety by phone today to assist with re-scheduling  with the Licensed Clinical Child Psychotherapist.  Follow up plan: Unsuccessful telephone outreach attempt made. A HIPAA compliant phone message was left for the patient providing contact information and requesting a return call.  Leotis Rase [facility], [facility] Guide  Direct Dial : [phone]  Fax [phone]

## 2024-04-16 ENCOUNTER — Other Ambulatory Visit (HOSPITAL_COMMUNITY): Payer: Self-pay | Admitting: Cardiology

## 2024-04-16 ENCOUNTER — Other Ambulatory Visit: Payer: Self-pay | Admitting: Family Medicine

## 2024-04-17 ENCOUNTER — Encounter (HOSPITAL_COMMUNITY)

## 2024-04-24 ENCOUNTER — Encounter (HOSPITAL_COMMUNITY)

## 2024-04-26 ENCOUNTER — Encounter (HOSPITAL_COMMUNITY)

## 2024-04-27 ENCOUNTER — Other Ambulatory Visit: Payer: Self-pay

## 2024-04-27 ENCOUNTER — Telehealth: Payer: Self-pay | Admitting: Home Health

## 2024-04-27 ENCOUNTER — Inpatient Hospital Stay (HOSPITAL_COMMUNITY)
Admission: EM | Admit: 2024-04-27 | Discharge: 2024-05-03 | DRG: 286 | Disposition: A | Discharge status: Home / Self Care | Attending: Internal Medicine | Admitting: Internal Medicine

## 2024-04-27 ENCOUNTER — Emergency Department (HOSPITAL_COMMUNITY)

## 2024-04-27 DIAGNOSIS — Z6838 Body mass index (BMI) 38.0-38.9, adult: Secondary | ICD-10-CM | POA: Diagnosis not present

## 2024-04-27 DIAGNOSIS — F17210 Nicotine dependence, cigarettes, uncomplicated: Secondary | ICD-10-CM | POA: Diagnosis present

## 2024-04-27 DIAGNOSIS — N1832 Chronic kidney disease, stage 3b: Secondary | ICD-10-CM | POA: Diagnosis present

## 2024-04-27 DIAGNOSIS — Z7982 Long term (current) use of aspirin: Secondary | ICD-10-CM

## 2024-04-27 DIAGNOSIS — I2781 Cor pulmonale (chronic): Secondary | ICD-10-CM | POA: Diagnosis present

## 2024-04-27 DIAGNOSIS — Z5982 Transportation insecurity: Secondary | ICD-10-CM

## 2024-04-27 DIAGNOSIS — I130 Hypertensive heart and chronic kidney disease with heart failure and stage 1 through stage 4 chronic kidney disease, or unspecified chronic kidney disease: Principal | ICD-10-CM | POA: Diagnosis present

## 2024-04-27 DIAGNOSIS — K219 Gastro-esophageal reflux disease without esophagitis: Secondary | ICD-10-CM | POA: Diagnosis present

## 2024-04-27 DIAGNOSIS — I509 Heart failure, unspecified: Secondary | ICD-10-CM | POA: Diagnosis present

## 2024-04-27 DIAGNOSIS — E1165 Type 2 diabetes mellitus with hyperglycemia: Secondary | ICD-10-CM | POA: Diagnosis present

## 2024-04-27 DIAGNOSIS — F17290 Nicotine dependence, other tobacco product, uncomplicated: Secondary | ICD-10-CM | POA: Diagnosis present

## 2024-04-27 DIAGNOSIS — I10 Essential (primary) hypertension: Secondary | ICD-10-CM | POA: Diagnosis present

## 2024-04-27 DIAGNOSIS — E1169 Type 2 diabetes mellitus with other specified complication: Secondary | ICD-10-CM | POA: Diagnosis present

## 2024-04-27 DIAGNOSIS — Z8673 Personal history of transient ischemic attack (TIA), and cerebral infarction without residual deficits: Secondary | ICD-10-CM

## 2024-04-27 DIAGNOSIS — I2510 Atherosclerotic heart disease of native coronary artery without angina pectoris: Secondary | ICD-10-CM | POA: Diagnosis present

## 2024-04-27 DIAGNOSIS — J449 Chronic obstructive pulmonary disease, unspecified: Secondary | ICD-10-CM | POA: Diagnosis present

## 2024-04-27 DIAGNOSIS — M7989 Other specified soft tissue disorders: Secondary | ICD-10-CM | POA: Diagnosis present

## 2024-04-27 DIAGNOSIS — E66812 Obesity, class 2: Secondary | ICD-10-CM | POA: Diagnosis present

## 2024-04-27 DIAGNOSIS — Z91013 Allergy to seafood: Secondary | ICD-10-CM

## 2024-04-27 DIAGNOSIS — E66813 Obesity, class 3: Secondary | ICD-10-CM | POA: Diagnosis not present

## 2024-04-27 DIAGNOSIS — E1122 Type 2 diabetes mellitus with diabetic chronic kidney disease: Secondary | ICD-10-CM | POA: Diagnosis present

## 2024-04-27 DIAGNOSIS — G8929 Other chronic pain: Secondary | ICD-10-CM | POA: Diagnosis present

## 2024-04-27 DIAGNOSIS — I50813 Acute on chronic right heart failure: Secondary | ICD-10-CM | POA: Diagnosis not present

## 2024-04-27 DIAGNOSIS — G4733 Obstructive sleep apnea (adult) (pediatric): Secondary | ICD-10-CM | POA: Diagnosis present

## 2024-04-27 DIAGNOSIS — D631 Anemia in chronic kidney disease: Secondary | ICD-10-CM | POA: Diagnosis present

## 2024-04-27 DIAGNOSIS — R0609 Other forms of dyspnea: Secondary | ICD-10-CM | POA: Diagnosis not present

## 2024-04-27 DIAGNOSIS — F32A Depression, unspecified: Secondary | ICD-10-CM | POA: Diagnosis present

## 2024-04-27 DIAGNOSIS — Z79899 Other long term (current) drug therapy: Secondary | ICD-10-CM

## 2024-04-27 DIAGNOSIS — Z9049 Acquired absence of other specified parts of digestive tract: Secondary | ICD-10-CM

## 2024-04-27 DIAGNOSIS — E7849 Other hyperlipidemia: Secondary | ICD-10-CM | POA: Diagnosis present

## 2024-04-27 DIAGNOSIS — I371 Nonrheumatic pulmonary valve insufficiency: Secondary | ICD-10-CM | POA: Diagnosis present

## 2024-04-27 DIAGNOSIS — Z7985 Long-term (current) use of injectable non-insulin antidiabetic drugs: Secondary | ICD-10-CM

## 2024-04-27 DIAGNOSIS — I5033 Acute on chronic diastolic (congestive) heart failure: Secondary | ICD-10-CM | POA: Diagnosis present

## 2024-04-27 DIAGNOSIS — E7800 Pure hypercholesterolemia, unspecified: Secondary | ICD-10-CM | POA: Diagnosis present

## 2024-04-27 DIAGNOSIS — Z8249 Family history of ischemic heart disease and other diseases of the circulatory system: Secondary | ICD-10-CM

## 2024-04-27 DIAGNOSIS — Z91014 Allergy to mammalian meats: Secondary | ICD-10-CM

## 2024-04-27 DIAGNOSIS — Z951 Presence of aortocoronary bypass graft: Secondary | ICD-10-CM

## 2024-04-27 DIAGNOSIS — Z833 Family history of diabetes mellitus: Secondary | ICD-10-CM

## 2024-04-27 DIAGNOSIS — Z7984 Long term (current) use of oral hypoglycemic drugs: Secondary | ICD-10-CM

## 2024-04-27 DIAGNOSIS — I272 Pulmonary hypertension, unspecified: Secondary | ICD-10-CM | POA: Diagnosis not present

## 2024-04-27 DIAGNOSIS — R251 Tremor, unspecified: Secondary | ICD-10-CM | POA: Diagnosis not present

## 2024-04-27 DIAGNOSIS — E785 Hyperlipidemia, unspecified: Secondary | ICD-10-CM | POA: Diagnosis not present

## 2024-04-27 DIAGNOSIS — Z87440 Personal history of urinary (tract) infections: Secondary | ICD-10-CM

## 2024-04-27 DIAGNOSIS — Z881 Allergy status to other antibiotic agents status: Secondary | ICD-10-CM

## 2024-04-27 LAB — PRO BRAIN NATRIURETIC PEPTIDE: Pro Brain Natriuretic Peptide: 12483 pg/mL — ABNORMAL HIGH

## 2024-04-27 LAB — CBC
HCT: 30.6 % — ABNORMAL LOW (ref 36.0–46.0)
Hemoglobin: 8.5 g/dL — ABNORMAL LOW (ref 12.0–15.0)
MCH: 20.9 pg — ABNORMAL LOW (ref 26.0–34.0)
MCHC: 27.8 g/dL — ABNORMAL LOW (ref 30.0–36.0)
MCV: 75.4 fL — ABNORMAL LOW (ref 80.0–100.0)
Platelets: 307 K/uL (ref 150–400)
RBC: 4.06 MIL/uL (ref 3.87–5.11)
RDW: 20.5 % — ABNORMAL HIGH (ref 11.5–15.5)
WBC: 6.4 K/uL (ref 4.0–10.5)
nRBC: 0 % (ref 0.0–0.2)

## 2024-04-27 LAB — TROPONIN T, HIGH SENSITIVITY
Troponin T High Sensitivity: 30 ng/L — ABNORMAL HIGH (ref 0–19)
Troponin T High Sensitivity: 30 ng/L — ABNORMAL HIGH (ref 0–19)

## 2024-04-27 LAB — BASIC METABOLIC PANEL WITH GFR
Anion gap: 10 (ref 5–15)
BUN: 31 mg/dL — ABNORMAL HIGH (ref 8–23)
CO2: 26 mmol/L (ref 22–32)
Calcium: 9.4 mg/dL (ref 8.9–10.3)
Chloride: 101 mmol/L (ref 98–111)
Creatinine, Ser: 1.53 mg/dL — ABNORMAL HIGH (ref 0.44–1.00)
GFR, Estimated: 37 mL/min — ABNORMAL LOW
Glucose, Bld: 125 mg/dL — ABNORMAL HIGH (ref 70–99)
Potassium: 3.9 mmol/L (ref 3.5–5.1)
Sodium: 137 mmol/L (ref 135–145)

## 2024-04-27 LAB — MAGNESIUM: Magnesium: 1.9 mg/dL (ref 1.7–2.4)

## 2024-04-27 MED ORDER — ONDANSETRON HCL 4 MG PO TABS: 4.0000 mg | ORAL_TABLET | Freq: Four times a day (QID) | ORAL | Status: DC | PRN

## 2024-04-27 MED ORDER — GABAPENTIN 300 MG PO CAPS
300.0000 mg | ORAL_CAPSULE | Freq: Two times a day (BID) | ORAL | Status: DC
  Administered 2024-04-28 – 2024-05-03 (×11): 300 mg via ORAL
  Filled 2024-04-27 (×11): qty 1

## 2024-04-27 MED ORDER — BUPROPION HCL ER (SR) 150 MG PO TB12
150.0000 mg | ORAL_TABLET | Freq: Two times a day (BID) | ORAL | Status: DC
  Filled 2024-04-27 (×10): qty 1

## 2024-04-27 MED ORDER — PANTOPRAZOLE SODIUM 40 MG PO TBEC
40.0000 mg | DELAYED_RELEASE_TABLET | Freq: Two times a day (BID) | ORAL | Status: DC
  Administered 2024-04-28 – 2024-05-03 (×12): 40 mg via ORAL
  Filled 2024-04-27 (×12): qty 1

## 2024-04-27 MED ORDER — ACETAMINOPHEN 650 MG RE SUPP: 650.0000 mg | Freq: Four times a day (QID) | RECTAL | Status: DC | PRN

## 2024-04-27 MED ORDER — FUROSEMIDE 20 MG PO TABS: 60.0000 mg | ORAL_TABLET | Freq: Two times a day (BID) | ORAL | Status: DC

## 2024-04-27 MED ORDER — ACETAMINOPHEN 325 MG PO TABS
650.0000 mg | ORAL_TABLET | Freq: Four times a day (QID) | ORAL | Status: DC | PRN
  Administered 2024-04-28 – 2024-04-30 (×3): 650 mg via ORAL
  Filled 2024-04-27 (×3): qty 2

## 2024-04-27 MED ORDER — ATORVASTATIN CALCIUM 10 MG PO TABS
10.0000 mg | ORAL_TABLET | Freq: Every day | ORAL | Status: DC
  Administered 2024-04-28 – 2024-05-03 (×6): 10 mg via ORAL
  Filled 2024-04-27 (×6): qty 1

## 2024-04-27 MED ORDER — ENOXAPARIN SODIUM 40 MG/0.4ML IJ SOSY
40.0000 mg | PREFILLED_SYRINGE | Freq: Every day | INTRAMUSCULAR | Status: DC
  Administered 2024-04-28 – 2024-05-02 (×5): 40 mg via SUBCUTANEOUS
  Filled 2024-04-27 (×5): qty 0.4

## 2024-04-27 MED ORDER — FUROSEMIDE 10 MG/ML IJ SOLN
80.0000 mg | Freq: Once | INTRAMUSCULAR | Status: AC
  Administered 2024-04-28: 80 mg via INTRAVENOUS
  Filled 2024-04-27: qty 8

## 2024-04-27 MED ORDER — OXYCODONE HCL 5 MG PO TABS: 5.0000 mg | ORAL_TABLET | ORAL | Status: DC | PRN

## 2024-04-27 MED ORDER — ONDANSETRON HCL 4 MG/2ML IJ SOLN: 4.0000 mg | Freq: Four times a day (QID) | INTRAMUSCULAR | Status: DC | PRN

## 2024-04-27 NOTE — ED Provider Notes (Signed)
 " [place] [facility] Provider Note   CSN: [id number] Arrival date & time: 04/27/24  8087     Patient presents with: Chest Pain   Deborah Coffey is a 69 y.o. female with a history of diabetes, TIA, pulmonary hypertension, hypertension, CKD, CHF, CAD, and everyday smoker who presents to the emergency department for evaluation of ongoing shortness of breath. Symptoms began 2 weeks ago and have been intermittent-more notably on ambulation.  Patient states that over the last couple of days she has noticed increased edema to her lower extremities, chest pain, and shortness of breath at rest.  Patient states that she is fully compliant with medication regimen including 120 mg Lasix  per day. Patient describes the pain as pressure located substernal no clear radiation.  Pain does not change with exertion, movement, inspiration or palpation.  Associated symptoms bilateral lower extremity edema, and moderate abdominal distention. The patient denies syncope, fever, cough, hemoptysis, unilateral leg swelling, focal neurological deficits, or recent trauma.  The patient is visibly short of breath, however is in no acute distress.   {Add pertinent medical, surgical, social history, OB history to HPI:32947}  Chest Pain      Prior to Admission medications  Medication Sig Start Date End Date Taking? Authorizing Provider  acetaminophen  (TYLENOL ) 500 MG tablet Take 1,000 mg by mouth every 6 (six) hours as needed for headache or moderate pain (pain score 4-6).    [provider]  Alcohol Swabs (B-D SINGLE USE SWABS REGULAR) PADS USE AS DIRECTED 04/13/17   Jegede, Olugbemiga E, MD  aspirin  EC 81 MG tablet Take 81 mg by mouth daily.    [provider]  atorvastatin  (LIPITOR) 10 MG tablet Take 1 tablet (10 mg total) by mouth daily. 02/08/24   Newlin, Enobong, MD  Blood Glucose Monitoring Suppl (ONETOUCH VERIO) w/Device KIT Use as instructed to test blood  glucose 3 times daily. 02/05/22   Newlin, Enobong, MD  buPROPion  (WELLBUTRIN  SR) 150 MG 12 hr tablet Take 1 tablet (150 mg total) by mouth 2 (two) times daily. For smoking cessation 02/17/24   Newlin, Enobong, MD  CALCIUM -MAGNESIUM -ZINC PO Take 1 tablet by mouth daily.    [provider]  Continuous Glucose Receiver (DEXCOM G7 RECEIVER) DEVI USE AS DIRECTED 03/02/24   Newlin, Enobong, MD  Continuous Glucose Sensor (DEXCOM G7 SENSOR) MISC Use to check blood glucose throughout the day. Changes sensors once every 10 days. 01/03/24   Newlin, Enobong, MD  cyanocobalamin  (VITAMIN B12) 1000 MCG tablet Take 1,000 mcg by mouth daily.    [provider]  diclofenac  Sodium (VOLTAREN ) 1 % GEL Apply 2 g topically daily as needed (muscle pain). 02/02/23   Newlin, Enobong, MD  digoxin  (LANOXIN ) 0.125 MG tablet Take 1 tablet (0.125 mg total) by mouth daily. Patient not taking: Reported on 03/01/2024 01/03/24   Bensimhon, Toribio SAUNDERS, MD  DROPLET PEN NEEDLES 31G X 8 MM MISC USE WITH TOUJEO  SOLOSTAR PEN EVERY DAY 02/09/24   Newlin, Enobong, MD  FARXIGA  10 MG TABS tablet Take 10 mg by mouth daily. 02/08/24   [provider]  furosemide  (LASIX ) 40 MG tablet TAKE 1 AND 1/2 TABLETS TWICE DAILY 04/16/24   Bensimhon, Toribio SAUNDERS, MD  gabapentin  (NEURONTIN ) 300 MG capsule Take 300 mg by mouth 2 (two) times daily.    [provider]  glucose blood (ONETOUCH VERIO) test strip Use as instructed to test blood glucose 3 times daily. 02/05/22   Newlin, Enobong, MD  insulin  glargine, 2 Unit Dial , (TOUJEO  MAX SOLOSTAR) 300 UNIT/ML Solostar Pen Inject 46 Units into the skin at bedtime. 02/08/24   Newlin, Enobong, MD  Lancets (ONETOUCH DELICA PLUS [place]) MISC Use as instructed to test blood glucose 3 times daily. 02/05/22   Newlin, Enobong, MD  Macitentan -Tadalafil  (OPSYNVI ) 10-40 MG TABS Take 1 tablet by mouth in the morning. Patient not taking: Reported on 03/01/2024    [provider]   metFORMIN  (GLUCOPHAGE -XR) 500 MG 24 hr tablet Take 1,000 mg by mouth 2 (two) times daily. 01/03/24   [provider]  Multiple Vitamin (MULTIVITAMIN WITH MINERALS) TABS tablet Take 1 tablet by mouth daily.    [provider]  nitroGLYCERIN  (NITROSTAT ) 0.4 MG SL tablet Place 1 tablet (0.4 mg total) under the tongue every 5 (five) minutes as needed for chest pain. 03/03/23   Vonna Sharlet POUR, MD  OZEMPIC , 2 MG/DOSE, 8 MG/3ML [facility]  01/03/24   [provider]  pantoprazole  (PROTONIX ) 40 MG tablet Take 1 tablet (40 mg total) by mouth 2 (two) times daily. 02/08/24   Newlin, Enobong, MD  Vitamin D , Cholecalciferol, 25 MCG (1000 UT) TABS Take 1,000 mcg by mouth daily.    [provider]    Allergies: Ciprofloxacin    Review of Systems  Cardiovascular:  Positive for chest pain.    Updated Vital Signs BP 139/72 (BP Location: Right Arm)   Pulse 99   Temp 98.5 F (36.9 C)   Resp 18   LMP  (LMP Unknown)   SpO2 97%   Physical Exam  (all labs ordered are listed, but only abnormal results are displayed) Labs Reviewed  BASIC METABOLIC PANEL WITH GFR - Abnormal; Notable for the following components:      Result Value   Glucose, Bld 125 (*)    BUN 31 (*)    Creatinine, Ser 1.53 (*)    GFR, Estimated 37 (*)    All other components within normal limits  PRO BRAIN NATRIURETIC PEPTIDE - Abnormal; Notable for the following components:   Pro Brain Natriuretic Peptide 12,483.0 (*)    All other components within normal limits  CBC - Abnormal; Notable for the following components:   Hemoglobin 8.5 (*)    HCT 30.6 (*)    MCV 75.4 (*)    MCH 20.9 (*)    MCHC 27.8 (*)    RDW 20.5 (*)    All other components within normal limits  TROPONIN T, HIGH SENSITIVITY - Abnormal; Notable for the following components:   Troponin T High Sensitivity 30 (*)    All other components within normal limits  MAGNESIUM   TROPONIN T, HIGH SENSITIVITY    EKG: None  Radiology: DG  Chest 2 View Result Date: 04/27/2024 CLINICAL DATA:  Chest pain. EXAM: CHEST - 2 VIEW COMPARISON:  January 28, 2024 FINDINGS: Multiple sternal wires and vascular clips are present. The cardiac silhouette is mildly enlarged and unchanged in size. Both lungs are clear. Multilevel degenerative changes are present throughout the thoracic spine. IMPRESSION: 1. Evidence of prior median sternotomy/CABG. 2. No acute cardiopulmonary disease. Electronically Signed   By: Suzen Dials M.D.   On: 04/27/2024 20:02    {Document cardiac monitor, telemetry assessment procedure when appropriate:32947} Procedures   Medications Ordered in the ED - No data to display    {Click here for ABCD2, HEART and other calculators REFRESH Note before signing:1}  Medical Decision Making Amount and/or Complexity of Data Reviewed Radiology: ordered.   ***  {Document critical care time when appropriate  Document review of labs and clinical decision tools ie CHADS2VASC2, etc  Document your independent review of radiology images and any outside records  Document your discussion with family members, caretakers and with consultants  Document social determinants of health affecting pt's care  Document your decision making why or why not admission, treatments were needed:32947:::1}   Final diagnoses:  None    ED Discharge Orders     None        "

## 2024-04-27 NOTE — Telephone Encounter (Signed)
 Patient called after hours line, c/o >1 week SOB, leg edema up to thigh, currently taking Lasix  80mg  BID, felt this is not working. She has appt on 05/02/23, wants urgent relief. She wants to go to the ER. Advised the patient this is ok.

## 2024-04-27 NOTE — ED Provider Triage Note (Signed)
 Emergency Medicine Provider Triage Evaluation Note  Deborah Coffey , a 69 y.o. female  was evaluated in triage.  Pt complains shortness of breath, chest pain, lower extremity edema.  History of CHF.  Review of Systems  Positive: Shortness of breath, chest pain, lower extremity edema Negative: Fever, chills, nausea, vomiting  Physical Exam  BP 139/72 (BP Location: Right Arm)   Pulse 99   Temp 98.5 F (36.9 C)   Resp 18   LMP  (LMP Unknown)   SpO2 97%  Gen:   Awake, no distress   Resp:  Increased effort, borderline tachycardic MSK:   Moves extremities without difficulty  Other:  Bilateral lower extremity pitting edema  Medical Decision Making  Medically screening exam initiated at 7:31 PM.  Appropriate orders placed.  Deborah Coffey was informed that the remainder of the evaluation will be completed by another provider, this initial triage assessment does not replace that evaluation, and the importance of remaining in the ED until their evaluation is complete.  Labs and imaging ordered   Deborah Coffey 04/27/24 8067

## 2024-04-27 NOTE — H&P (Signed)
 " History and Physical    Deborah Coffey FMW:[id number] DOB: 03-20-1956 DOA: 04/27/2024  PCP: Newlin, Enobong, MD   Chief Complaint:   HPI: Deborah Coffey is a 69 y.o. female with medical history significant of CHF, CAD, depression, hyperlipidemia who presents emergency department with shortness of breath.  Patient said 2 weeks of progressively worsening shortness of breath and weight gain she has been taking 60 mg of Lasix  twice per day without any noticeable diuresis.  She presents emergency department after her consulting with her cardiologist.  On arrival she was afebrile and hemodynamically stable.  Labs were obtained which showed creatinine 1.5 at baseline, magnesium  1.9, BNP 12,000, troponin 30.  Chest x-ray was obtained which showed no acute changes.  Patient was given IV diuresis and admitted for further workup.   Review of Systems: Review of Systems  Constitutional: Negative.   HENT: Negative.    Eyes: Negative.   Respiratory: Negative.    Cardiovascular:  Positive for orthopnea and leg swelling.  Gastrointestinal: Negative.   Genitourinary: Negative.   Musculoskeletal: Negative.   Skin: Negative.   Neurological: Negative.   Endo/Heme/Allergies: Negative.   Psychiatric/Behavioral: Negative.       As per HPI otherwise 10 point review of systems negative.   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 competitive flow   TIA  (transient ischemic attack)    Type II diabetes mellitus (HCC)     Past Surgical History:  Procedure Laterality Date   CARDIAC CATHETERIZATION  2010   Previous LAD 95% lesion - now ~30-40%; patent LIMA with competitive flow   CESAREAN SECTION  1977; 1989   CHOLECYSTECTOMY  ~ 2012   CORONARY ARTERY BYPASS GRAFT  02/2004   CABG X1 (03/22/2013); LIMA-LAD   RIGHT HEART CATH N/A 07/14/2023   Procedure: RIGHT HEART CATH;  Surgeon: Cherrie Toribio SAUNDERS, MD;  Location: [facility] INVASIVE CV LAB;  Service: Cardiovascular;  Laterality: N/A;   RIGHT HEART CATH N/A 02/16/2024   Procedure: RIGHT HEART CATH;  Surgeon: Cherrie Toribio SAUNDERS, MD;  Location: [facility] INVASIVE CV LAB;  Service: Cardiovascular;  Laterality: N/A;   RIGHT/LEFT HEART CATH AND CORONARY/GRAFT ANGIOGRAPHY N/A 04/16/2021   Procedure: RIGHT/LEFT HEART CATH AND CORONARY/GRAFT ANGIOGRAPHY;  Surgeon: Dann Candyce RAMAN, MD;  Location: [facility] INVASIVE CV LAB;  Service: Cardiovascular;  Laterality: N/A;   TUBAL LIGATION  1989     reports that she has been smoking cigarettes. She started smoking about 51 years ago. She has a 25.3 pack-year smoking history. She uses smokeless tobacco. She reports that she does not currently use alcohol. She reports that she does not use drugs.  Family History  Problem Relation Age of Onset   Heart disease Mother    Diabetes Mother    Cancer Mother        cervical  Diabetes Father    Diabetes Sister    Heart disease Sister    Lupus Sister    Heart failure Sister    Hepatitis C Sister    Hypertension Daughter    Diabetes Brother    Heart attack Brother    Heart failure Brother    Diabetes Brother    Colon cancer Neg Hx    Breast cancer Neg Hx     Prior to Admission medications  Medication Sig Start Date End Date Taking? Authorizing Provider  acetaminophen  (TYLENOL ) 500 MG tablet Take 1,000 mg by mouth every 6 (six) hours as needed for headache or moderate pain (pain score 4-6).    [provider]   Alcohol Swabs (B-D SINGLE USE SWABS REGULAR) PADS USE AS DIRECTED 04/13/17   Jegede, Olugbemiga E, MD  aspirin  EC 81 MG tablet Take 81 mg by mouth daily.    [provider]  atorvastatin  (LIPITOR) 10 MG tablet Take 1 tablet (10 mg total) by mouth daily. 02/08/24   Newlin, Enobong, MD  Blood Glucose Monitoring Suppl (ONETOUCH VERIO) w/Device KIT Use as instructed to test blood glucose 3 times daily. 02/05/22   Newlin, Enobong, MD  buPROPion  (WELLBUTRIN  SR) 150 MG 12 hr tablet Take 1 tablet (150 mg total) by mouth 2 (two) times daily. For smoking cessation 02/17/24   Newlin, Enobong, MD  CALCIUM -MAGNESIUM -ZINC PO Take 1 tablet by mouth daily.    [provider]  Continuous Glucose Receiver (DEXCOM G7 RECEIVER) DEVI USE AS DIRECTED 03/02/24   Newlin, Enobong, MD  Continuous Glucose Sensor (DEXCOM G7 SENSOR) MISC Use to check blood glucose throughout the day. Changes sensors once every 10 days. 01/03/24   Newlin, Enobong, MD  cyanocobalamin  (VITAMIN B12) 1000 MCG tablet Take 1,000 mcg by mouth daily.    [provider]  diclofenac  Sodium (VOLTAREN ) 1 % GEL Apply 2 g topically daily as needed (muscle pain). 02/02/23   Newlin, Enobong, MD  digoxin  (LANOXIN ) 0.125 MG tablet Take 1 tablet (0.125 mg total) by mouth daily. Patient not taking: Reported on 03/01/2024 01/03/24   Bensimhon, Toribio SAUNDERS, MD  DROPLET PEN NEEDLES 31G X 8 MM MISC USE WITH TOUJEO  SOLOSTAR PEN EVERY DAY 02/09/24   Newlin, Enobong, MD  FARXIGA  10 MG TABS tablet Take 10 mg by mouth daily. 02/08/24   [provider]  furosemide  (LASIX ) 40 MG tablet TAKE 1 AND 1/2 TABLETS TWICE DAILY 04/16/24   Bensimhon, Toribio SAUNDERS, MD  gabapentin  (NEURONTIN ) 300 MG capsule Take 300 mg by mouth 2 (two) times daily.    [provider]  glucose blood (ONETOUCH VERIO) test strip Use as instructed to test blood glucose 3 times daily. 02/05/22   Newlin, Enobong, MD  insulin  glargine, 2 Unit Dial , (TOUJEO  MAX SOLOSTAR)  300 UNIT/ML Solostar Pen Inject 46 Units into the skin at bedtime. 02/08/24   Newlin, Enobong, MD  Lancets (ONETOUCH DELICA PLUS [place]) MISC Use as instructed to test blood glucose 3 times daily. 02/05/22   Newlin, Enobong, MD  Macitentan -Tadalafil  (OPSYNVI ) 10-40 MG TABS Take 1 tablet by mouth in the morning. Patient not taking: Reported on 03/01/2024    [provider]  metFORMIN  (GLUCOPHAGE -XR) 500 MG 24 hr tablet Take 1,000 mg by mouth 2 (two) times daily. 01/03/24   [provider]  Multiple Vitamin (MULTIVITAMIN WITH MINERALS) TABS tablet Take 1 tablet by mouth daily.    [provider]  nitroGLYCERIN  (NITROSTAT ) 0.4 MG SL tablet Place 1 tablet (0.4 mg total)  under the tongue every 5 (five) minutes as needed for chest pain. 03/03/23   Vonna Sharlet POUR, MD  OZEMPIC , 2 MG/DOSE, 8 MG/3ML [facility]  01/03/24   [provider]  pantoprazole  (PROTONIX ) 40 MG tablet Take 1 tablet (40 mg total) by mouth 2 (two) times daily. 02/08/24   Newlin, Enobong, MD  Vitamin D , Cholecalciferol, 25 MCG (1000 UT) TABS Take 1,000 mcg by mouth daily.    [provider]    Physical Exam: Vitals:   04/27/24 1922  BP: 139/72  Pulse: 99  Resp: 18  Temp: 98.5 F (36.9 C)  SpO2: 97%   Physical Exam Vitals reviewed.  Constitutional:      Appearance: She is normal weight.  HENT:     Head: Normocephalic.  Cardiovascular:     Rate and Rhythm: Normal rate and regular rhythm.     Heart sounds: Normal heart sounds.  Pulmonary:     Effort: Pulmonary effort is normal.     Breath sounds: Normal breath sounds.  Abdominal:     General: Bowel sounds are normal.     Palpations: Abdomen is soft.  Musculoskeletal:        General: Normal range of motion.     Cervical back: Normal range of motion.  Skin:    General: Skin is warm.     Capillary Refill: Capillary refill takes less than 2 seconds.  Neurological:     General: No focal deficit present.     Mental Status: She  is alert.  Psychiatric:        Mood and Affect: Mood normal.       Labs on Admission: I have personally reviewed the patients's labs and imaging studies.  Assessment/Plan Principal Problem:   Acute exacerbation of CHF (congestive heart failure) (HCC)   # Acute on chronic heart failure with reduced ejection fraction laceration - Patient last had echocardiogram in October which demonstrated EF 60% with reduced right ventricular function and severe pulmonary hypertension - Patient is volume overloaded with BNP of 12,000  Plan: Continue IV diuresis  # Hyperlipidemia-continue statin  # Depression-continue Wellbutrin   # Chronic pain-continue gabapentin   # GERD-continue Protonix    Admission status: Inpatient Telemetry  Certification: The appropriate patient status for this patient is INPATIENT. Inpatient status is judged to be reasonable and necessary in order to provide the required intensity of service to ensure the patient's safety. The patient's presenting symptoms, physical exam findings, and initial radiographic and laboratory data in the context of their chronic comorbidities is felt to place them at high risk for further clinical deterioration. Furthermore, it is not anticipated that the patient will be medically stable for discharge from the hospital within 2 midnights of admission.   * I certify that at the point of admission it is my clinical judgment that the patient will require inpatient hospital care spanning beyond 2 midnights from the point of admission due to high intensity of service, high risk for further deterioration and high frequency of surveillance required.DEWAINE Lamar Dess MD [facility] If 7PM-7AM, please contact night-coverage [email or web address]  04/27/2024, 10:40 PM        [1]  Allergies Allergen Reactions   Ciprofloxacin Rash   "

## 2024-04-27 NOTE — ED Notes (Signed)
 PA at triage notified on elevated blood tests results.

## 2024-04-27 NOTE — ED Triage Notes (Signed)
 Patient reports intermittent mid chest pain with SOB for 2 weeks and lower legs edema , her cardiologist is Dr. Luz .

## 2024-04-27 NOTE — ED Notes (Signed)
 Daughter Zada [phone] would like an update asap

## 2024-04-28 ENCOUNTER — Encounter (HOSPITAL_COMMUNITY): Payer: Self-pay | Admitting: Internal Medicine

## 2024-04-28 DIAGNOSIS — E66813 Obesity, class 3: Secondary | ICD-10-CM | POA: Diagnosis not present

## 2024-04-28 DIAGNOSIS — I5033 Acute on chronic diastolic (congestive) heart failure: Secondary | ICD-10-CM

## 2024-04-28 DIAGNOSIS — I10 Essential (primary) hypertension: Secondary | ICD-10-CM | POA: Diagnosis not present

## 2024-04-28 DIAGNOSIS — E785 Hyperlipidemia, unspecified: Secondary | ICD-10-CM | POA: Diagnosis not present

## 2024-04-28 DIAGNOSIS — I2510 Atherosclerotic heart disease of native coronary artery without angina pectoris: Secondary | ICD-10-CM

## 2024-04-28 DIAGNOSIS — Z8673 Personal history of transient ischemic attack (TIA), and cerebral infarction without residual deficits: Secondary | ICD-10-CM | POA: Diagnosis not present

## 2024-04-28 DIAGNOSIS — E1169 Type 2 diabetes mellitus with other specified complication: Secondary | ICD-10-CM | POA: Diagnosis not present

## 2024-04-28 DIAGNOSIS — F32A Depression, unspecified: Secondary | ICD-10-CM

## 2024-04-28 DIAGNOSIS — N1832 Chronic kidney disease, stage 3b: Secondary | ICD-10-CM

## 2024-04-28 DIAGNOSIS — E66812 Obesity, class 2: Secondary | ICD-10-CM | POA: Diagnosis not present

## 2024-04-28 LAB — PROTIME-INR
INR: 1 (ref 0.8–1.2)
Prothrombin Time: 13.6 s (ref 11.4–15.2)

## 2024-04-28 LAB — BASIC METABOLIC PANEL WITH GFR
Anion gap: 10 (ref 5–15)
BUN: 28 mg/dL — ABNORMAL HIGH (ref 8–23)
CO2: 26 mmol/L (ref 22–32)
Calcium: 9.2 mg/dL (ref 8.9–10.3)
Chloride: 103 mmol/L (ref 98–111)
Creatinine, Ser: 1.47 mg/dL — ABNORMAL HIGH (ref 0.44–1.00)
GFR, Estimated: 39 mL/min — ABNORMAL LOW
Glucose, Bld: 92 mg/dL (ref 70–99)
Potassium: 3.5 mmol/L (ref 3.5–5.1)
Sodium: 139 mmol/L (ref 135–145)

## 2024-04-28 LAB — CBC
HCT: 29.2 % — ABNORMAL LOW (ref 36.0–46.0)
Hemoglobin: 8.2 g/dL — ABNORMAL LOW (ref 12.0–15.0)
MCH: 21 pg — ABNORMAL LOW (ref 26.0–34.0)
MCHC: 28.1 g/dL — ABNORMAL LOW (ref 30.0–36.0)
MCV: 74.9 fL — ABNORMAL LOW (ref 80.0–100.0)
Platelets: 314 K/uL (ref 150–400)
RBC: 3.9 MIL/uL (ref 3.87–5.11)
RDW: 20.5 % — ABNORMAL HIGH (ref 11.5–15.5)
WBC: 6.5 K/uL (ref 4.0–10.5)
nRBC: 0 % (ref 0.0–0.2)

## 2024-04-28 LAB — CBG MONITORING, ED
Glucose-Capillary: 47 mg/dL — ABNORMAL LOW (ref 70–99)
Glucose-Capillary: 92 mg/dL (ref 70–99)
Glucose-Capillary: 79 mg/dL (ref 70–99)
Glucose-Capillary: 80 mg/dL (ref 70–99)

## 2024-04-28 LAB — GLUCOSE, CAPILLARY
Glucose-Capillary: 59 mg/dL — ABNORMAL LOW (ref 70–99)
Glucose-Capillary: 86 mg/dL (ref 70–99)
Glucose-Capillary: 137 mg/dL — ABNORMAL HIGH (ref 70–99)

## 2024-04-28 MED ORDER — ASPIRIN 81 MG PO TBEC
81.0000 mg | DELAYED_RELEASE_TABLET | Freq: Every day | ORAL | Status: DC
  Administered 2024-04-28 – 2024-05-02 (×5): 81 mg via ORAL
  Filled 2024-04-28 (×5): qty 1

## 2024-04-28 MED ORDER — FUROSEMIDE 10 MG/ML IJ SOLN
80.0000 mg | Freq: Two times a day (BID) | INTRAMUSCULAR | Status: DC
  Administered 2024-04-28 – 2024-05-01 (×8): 80 mg via INTRAVENOUS
  Filled 2024-04-28 (×8): qty 8

## 2024-04-28 MED ORDER — METOLAZONE 2.5 MG PO TABS
2.5000 mg | ORAL_TABLET | Freq: Once | ORAL | Status: AC
  Administered 2024-04-28: 2.5 mg via ORAL
  Filled 2024-04-28: qty 1

## 2024-04-28 MED ORDER — FUROSEMIDE 10 MG/ML IJ SOLN
80.0000 mg | Freq: Once | INTRAMUSCULAR | Status: AC
  Administered 2024-04-28: 80 mg via INTRAVENOUS
  Filled 2024-04-28: qty 8

## 2024-04-28 MED ORDER — INSULIN ASPART 100 UNIT/ML IJ SOLN
0.0000 [IU] | Freq: Three times a day (TID) | INTRAMUSCULAR | Status: DC
  Administered 2024-04-29: 12 [IU] via SUBCUTANEOUS
  Filled 2024-04-28: qty 12

## 2024-04-28 NOTE — Hospital Course (Addendum)
 Deborah Coffey was admitted to the hospital with the working diagnosis of heart failure exacerbation   69 yo female with the past medical history of heart failure, pulmonary hypertension, coronary artery disease, hyperlipidemia, obesity and depression who presented with dyspnea.  Reported 2 weeks of progressive dyspnea on exertion, and weight gain, despite taking an increased dose of furosemide , 80 mg po bid. On the day of admission she called her cardiologist office, and was advised to come to the ED.  On her initial physical examination her blood pressure was 139/72, HR 99, RR 18 and 02 saturation 97%, lungs with no wheezing or rhonchi, heart with S1 and S2 present and regular, with no gallops, abdomen with no distention and positive lower extremity edema.   Na 137, K 3.9 Cl 101 bicarbonate 26 glucose 125, bun 31 cr 1,53  Mg 1,9  Pro BNP 12,483  High sensitive troponin 30 and 30  Wbc 6,4 hgb 8,5 plt 307   Chest radiograph with cardiomegaly, with mild hilar vascular congestion, small left pleural effusion.   EKG 84 bpm, right axis deviation, normal intervals, qtc 452, sinus rhythm with no significant ST segment changes negative T wave lead V1 and V2.   Patient was placed on furosemide  for diuresis.   01/11 continue volume overloaded.  01/12 slow response to diuresis.

## 2024-04-28 NOTE — Assessment & Plan Note (Signed)
 High sensitive troponin elevation due to heart failure and volume overload, no acute coronary syndrome

## 2024-04-28 NOTE — Assessment & Plan Note (Addendum)
 Echocardiogram with preserved LV systolic function with EF 55 to 60%, interventricular septum is flattened in systole and diastole, RV systolic function with moderate reduction, RV with moderate to severe cavity enlargement, RVSP 82.6 mmHg, RA with severe enlargement, severe tricuspid regurgitation, moderate pulmonic regurgitation.   Acute on chronic cor pulmonale Pulmonary hypertension.  RV failure   Documented urine output is 1,600 ml Continue diuresis with furosemide  80 mg IV q12 Metolazone  2,5 mg 01/10 and 01/11 Metolazone  5 mg today   Limited medical therapy due to reduced GFR Close follow up on blood pressure.  As outpatient patient was sildenafil , then transitioned to Opsynvi , then sotatercept, but this lat one not able to get because not stable home. 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

## 2024-04-28 NOTE — Assessment & Plan Note (Signed)
Check BMI

## 2024-04-28 NOTE — ED Notes (Signed)
 CBG 92 on recheck after juice

## 2024-04-28 NOTE — Assessment & Plan Note (Signed)
Continue with bupropion.  ?

## 2024-04-28 NOTE — Progress Notes (Signed)
 " Progress Note   Patient: Deborah Coffey FMW:[id number] DOB: 07-Dec-1955 DOA: 04/27/2024     1 DOS: the patient was seen and examined on 04/28/2024   Brief hospital course: Deborah Coffey was admitted to the hospital with the working diagnosis of heart failure exacerbation   69 yo female with the past medical history of heart failure, coronary artery disease, hyperlipidemia and depression who presented with dyspnea.  Reported 2 weeks of progressive dyspnea on exertion, and weight gain, despite taking an increased dose of furosemide , 80 mg po bid. On the day of admission she called her cardiologist office, and was advised to come to the ED.  On her initial physical examination her blood pressure was 139/72, HR 99, RR 18 and 02 saturation 97%, lungs with no wheezing or rhonchi, heart with S1 and S2 present and regular, with no gallops, abdomen with no distention and positive lower extremity edema.   Na 137, K 3.9 Cl 101 bicarbonate 26 glucose 125, bun 31 cr 1,53  Mg 1,9  Pro BNP 12,483  High sensitive troponin 30 and 30  Wbc 6,4 hgb 8,5 plt 307   Chest radiograph with cardiomegaly, with mild hilar vascular congestion, small left pleural effusion.   EKG 84 bpm, right axis deviation, normal intervals, qtc 452, sinus rhythm with no significant ST segment changes negative T wave lead V1 and V2.   Patient was placed on furosemide  for diuresis.   Assessment and Plan: * Acute on chronic diastolic CHF (congestive heart failure) (HCC) 01/2024 echocardiogram with preserved LV systolic function with EF 60 to 65%, interventricular septum is flattened in systole and diastole, RV systolic function with moderate reduction, RV with moderate enlargement, RVSP 91.2 mmHg, RA with mild dilatation, severe tricuspid regurgitation. No pericardial effusion.   Acute on chronic cor pulmonale Pulmonary hypertension.  RV failure   Patient continue volume overloaded, will continue diuresis with furosemide  80 mg IV  q12 Add one dose of metolazone  2,5 mg Limited medical therapy due to reduced GFR Close follow up on blood pressure.  During this hospitalization will consult advanced heart failure team, she has a poor prognosis in the setting of RV failure.  Update echocardiogram on this admission   CAD (coronary artery disease) - CABG x 1 (LIMA-LAD) High sensitive troponin elevation due to heart failure and volume overload, no acute coronary syndrome  Essential hypertension Continue close blood pressure monitoring Continue diuresis   Chronic kidney disease, stage 3b (HCC) Renal function with serum cr at 1,47 down from 1,53, K is 3.5 and serum bicarbonate at 26, Na 139, plan to continue diuresis with furosemide .  One dose of metolazone  Follow up renal function and electrolytes in am, avoid hypotension or nephrotoxic medications   Type 2 diabetes mellitus with hyperlipidemia (HCC) Continue glucose cover and monitoring with insulin  sliding scale, glucose has been well controlled Continue statin therapy   H/O TIA (transient ischemic attack) and stroke Continue blood pressure monitoring  Continue asa and statin   Depression Continue with bupropion    Obesity, class 3 (HCC) Check BMI         Subjective: patient continue to have dyspnea and edema, no chest pain, very weak and deconditioned   Physical Exam: Vitals:   04/28/24 0630 04/28/24 0645 04/28/24 0947 04/28/24 1000  BP: (!) 107/58 110/69  115/67  Pulse: 89 86  91  Resp: 20 20  16   Temp:   98.5 F (36.9 C)   TempSrc:   Oral   SpO2: 100% 100%  100%   Neurology awake and alert, deconditioned ENT with mild pallor with no icterus Cardiovascular with S1 and S2 present and regular, with no gallops or rubs, positive systolic murmur at the left lower sternal border Positive JVD Respiratory with bilateral rales with no wheezing or rhonchi Abdomen protuberant, not tender and not distended Positive lower extremity edema pitting +++ warm  to touch  Data Reviewed:    Family Communication: no family at the bedside   Disposition: Status is: Inpatient Remains inpatient appropriate because: IV diuresis   Planned Discharge Destination: Home     Author: Elidia Toribio Furnace, MD 04/28/2024 10:57 AM  For on call review [email or web address]  "

## 2024-04-28 NOTE — Assessment & Plan Note (Signed)
 Renal function with serum cr at 1,47 down from 1,53, K is 3.5 and serum bicarbonate at 26, Na 139, plan to continue diuresis with furosemide .  One dose of metolazone  Follow up renal function and electrolytes in am, avoid hypotension or nephrotoxic medications

## 2024-04-28 NOTE — Assessment & Plan Note (Signed)
 Continue blood pressure monitoring  Continue asa and statin

## 2024-04-28 NOTE — ED Notes (Signed)
 Pt CBG 47, pt is awake and aox4. Able to take PO, pt given juice and MD made aware. No new orders at this time. Pt remains asymptomatic.

## 2024-04-28 NOTE — Assessment & Plan Note (Signed)
 Continue close blood pressure monitoring Continue diuresis

## 2024-04-28 NOTE — Assessment & Plan Note (Signed)
 Continue glucose cover and monitoring with insulin  sliding scale, glucose has been well controlled Fasting glucose today 152 mg/dl  Continue statin therapy

## 2024-04-29 ENCOUNTER — Inpatient Hospital Stay (HOSPITAL_COMMUNITY)

## 2024-04-29 ENCOUNTER — Encounter (HOSPITAL_COMMUNITY): Payer: Self-pay | Admitting: Internal Medicine

## 2024-04-29 DIAGNOSIS — E66812 Obesity, class 2: Secondary | ICD-10-CM

## 2024-04-29 DIAGNOSIS — I5033 Acute on chronic diastolic (congestive) heart failure: Secondary | ICD-10-CM | POA: Diagnosis not present

## 2024-04-29 DIAGNOSIS — I10 Essential (primary) hypertension: Secondary | ICD-10-CM | POA: Diagnosis not present

## 2024-04-29 DIAGNOSIS — R0609 Other forms of dyspnea: Secondary | ICD-10-CM

## 2024-04-29 DIAGNOSIS — I2510 Atherosclerotic heart disease of native coronary artery without angina pectoris: Secondary | ICD-10-CM | POA: Diagnosis not present

## 2024-04-29 DIAGNOSIS — N1832 Chronic kidney disease, stage 3b: Secondary | ICD-10-CM | POA: Diagnosis not present

## 2024-04-29 LAB — GLUCOSE, CAPILLARY
Glucose-Capillary: 267 mg/dL — ABNORMAL HIGH (ref 70–99)
Glucose-Capillary: 49 mg/dL — ABNORMAL LOW (ref 70–99)
Glucose-Capillary: 75 mg/dL (ref 70–99)
Glucose-Capillary: 89 mg/dL (ref 70–99)
Glucose-Capillary: 169 mg/dL — ABNORMAL HIGH (ref 70–99)

## 2024-04-29 LAB — BASIC METABOLIC PANEL WITH GFR
Anion gap: 10 (ref 5–15)
BUN: 34 mg/dL — ABNORMAL HIGH (ref 8–23)
CO2: 29 mmol/L (ref 22–32)
Calcium: 9 mg/dL (ref 8.9–10.3)
Chloride: 100 mmol/L (ref 98–111)
Creatinine, Ser: 1.94 mg/dL — ABNORMAL HIGH (ref 0.44–1.00)
GFR, Estimated: 28 mL/min — ABNORMAL LOW
Glucose, Bld: 190 mg/dL — ABNORMAL HIGH (ref 70–99)
Potassium: 4.1 mmol/L (ref 3.5–5.1)
Sodium: 139 mmol/L (ref 135–145)

## 2024-04-29 LAB — MAGNESIUM: Magnesium: 1.9 mg/dL (ref 1.7–2.4)

## 2024-04-29 LAB — ECHOCARDIOGRAM COMPLETE
AR max vel: 1.91 cm2
AV Peak grad: 4 mmHg
Ao pk vel: 1.01 m/s
Area-P 1/2: 6.37 cm2
Height: 64 in
S' Lateral: 3 cm
Weight: 3598.4 [oz_av]

## 2024-04-29 MED ORDER — METOLAZONE 5 MG PO TABS
2.5000 mg | ORAL_TABLET | Freq: Once | ORAL | Status: AC
  Administered 2024-04-29: 2.5 mg via ORAL
  Filled 2024-04-29: qty 1

## 2024-04-29 MED ORDER — INSULIN ASPART 100 UNIT/ML IJ SOLN
0.0000 [IU] | Freq: Three times a day (TID) | INTRAMUSCULAR | Status: DC
  Administered 2024-04-30 (×2): 2 [IU] via SUBCUTANEOUS
  Administered 2024-04-30 – 2024-05-01 (×3): 3 [IU] via SUBCUTANEOUS
  Administered 2024-05-01: 2 [IU] via SUBCUTANEOUS
  Administered 2024-05-02: 3 [IU] via SUBCUTANEOUS
  Administered 2024-05-02 (×2): 5 [IU] via SUBCUTANEOUS
  Administered 2024-05-03: 3 [IU] via SUBCUTANEOUS
  Filled 2024-04-29: qty 2
  Filled 2024-04-29 (×2): qty 3
  Filled 2024-04-29: qty 2
  Filled 2024-04-29 (×2): qty 5
  Filled 2024-04-29: qty 3
  Filled 2024-04-29: qty 2
  Filled 2024-04-29: qty 3

## 2024-04-29 MED ORDER — PERFLUTREN LIPID MICROSPHERE
1.0000 mL | INTRAVENOUS | Status: AC | PRN
  Administered 2024-04-29: 3 mL via INTRAVENOUS

## 2024-04-29 MED ORDER — MAGNESIUM SULFATE 2 GM/50ML IV SOLN
2.0000 g | Freq: Once | INTRAVENOUS | Status: AC
  Administered 2024-04-29: 2 g via INTRAVENOUS
  Filled 2024-04-29: qty 50

## 2024-04-29 NOTE — Progress Notes (Signed)
 " Progress Note   Patient: Deborah Coffey FMW:[id number] DOB: 27-Oct-1955 DOA: 04/27/2024     2 DOS: the patient was seen and examined on 04/29/2024   Brief hospital course: Deborah Coffey was admitted to the hospital with the working diagnosis of heart failure exacerbation   69 yo female with the past medical history of heart failure, coronary artery disease, hyperlipidemia and depression who presented with dyspnea.  Reported 2 weeks of progressive dyspnea on exertion, and weight gain, despite taking an increased dose of furosemide , 80 mg po bid. On the day of admission she called her cardiologist office, and was advised to come to the ED.  On her initial physical examination her blood pressure was 139/72, HR 99, RR 18 and 02 saturation 97%, lungs with no wheezing or rhonchi, heart with S1 and S2 present and regular, with no gallops, abdomen with no distention and positive lower extremity edema.   Na 137, K 3.9 Cl 101 bicarbonate 26 glucose 125, bun 31 cr 1,53  Mg 1,9  Pro BNP 12,483  High sensitive troponin 30 and 30  Wbc 6,4 hgb 8,5 plt 307   Chest radiograph with cardiomegaly, with mild hilar vascular congestion, small left pleural effusion.   EKG 84 bpm, right axis deviation, normal intervals, qtc 452, sinus rhythm with no significant ST segment changes negative T wave lead V1 and V2.   Patient was placed on furosemide  for diuresis.   01/11 continue volume overloaded.   Assessment and Plan: * Acute on chronic diastolic CHF (congestive heart failure) (HCC) Echocardiogram with preserved LV systolic function with EF 55 to 60%, interventricular septum is flattened in systole and diastole, RV systolic function with moderate reduction, RV with moderate to severe cavity enlargement, RVSP 82.6 mmHg, RA with severe enlargement, severe tricuspid regurgitation, moderate pulmonic regurgitation.   Acute on chronic cor pulmonale Pulmonary hypertension.  RV failure   Documented urine output is  1,300 ml Continue diuresis with furosemide  80 mg IV q12 Metolazone  2,5 mg 01/10 and 01/11  Limited medical therapy due to reduced GFR Close follow up on blood pressure.  During this hospitalization will consult advanced heart failure team, she has a poor prognosis in the setting of RV failure.   CAD (coronary artery disease) - CABG x 1 (LIMA-LAD) High sensitive troponin elevation due to heart failure and volume overload, no acute coronary syndrome  Essential hypertension Continue close blood pressure monitoring Continue diuresis   Chronic kidney disease, stage 3b (HCC) Follow up renal function with serum cr at 1,94 with K at 4,1 and serum bicarbonate at 29  Na 139 and Mg 1,9   Add 2 g Mg sulfate to avoid hypomagnesemia.  Continue diuresis with furosemide  and metolazone  Follow up renal function and electrolytes in am   Type 2 diabetes mellitus with hyperlipidemia (HCC) Continue glucose cover and monitoring with insulin  sliding scale, glucose has been well controlled Continue statin therapy   H/O TIA (transient ischemic attack) and stroke Continue blood pressure monitoring  Continue asa and statin   Depression Continue with bupropion    Obesity, class 2 Calculated BMI 38.6         Subjective: Patient with mild improvement in her symptoms, edema and dyspnea are improving but not yet back to baseline   Physical Exam: Vitals:   04/28/24 2043 04/28/24 2302 04/29/24 0512 04/29/24 0800  BP: (!) 93/54 105/72 (!) 110/53 (!) 144/69  Pulse: 94 100 (!) 107 93  Resp: 16 20 20 19   Temp: 98.6 F (37  C) 98.1 F (36.7 C) 98.5 F (36.9 C) 98 F (36.7 C)  TempSrc: Oral Oral Oral Oral  SpO2: 99% 98% 99% 98%  Weight:      Height:       Neurology awake and alert ENT with mild pallor with no icterus Cardiovascular with S1 and S2 present and regular, with no gallops or rubs, positive systolic murmur at the left lower sternal border.  No JVD Respiratory with mild rales at bases  with no wheezing or rhonchi Abdomen protuberant, soft and non tender Positive lower extremity edema ++ pitting  Data Reviewed:    Family Communication: no family at the bedside   Disposition: Status is: Inpatient Remains inpatient appropriate because: IV diuresis   Planned Discharge Destination: Home      Author: Elidia Toribio Furnace, MD 04/29/2024 11:08 AM  For on call review [email or web address].  "

## 2024-04-29 NOTE — Plan of Care (Signed)

## 2024-04-29 NOTE — Progress Notes (Signed)
 Mobility Specialist Progress Note:    04/29/24 1300  Mobility  Activity Ambulated with assistance;Pivoted/transferred to/from [facility]  Level of Assistance Standby assist, set-up cues, supervision of patient - no hands on  Assistive Device BSC;None  Distance Ambulated (ft) 3 ft  Activity Response Tolerated well  Mobility Referral Yes  Mobility visit 1 Mobility  Mobility Specialist Start Time (ACUTE ONLY) 1236  Mobility Specialist Stop Time (ACUTE ONLY) 1246  Mobility Specialist Time Calculation (min) (ACUTE ONLY) 10 min   Pt received on BSC and assisted pt back to bed. Pt was able to stand and pivot w/ no physical assistance. Pt would reach out for support while turning. Had x1 LOB but self corrected. Left seated EOB w/ call bell and personal belongings in reach. All needs met.   Thersia Minder Mobility Specialist  Please contact vis Secure Chat or  Rehab Office [phone]

## 2024-04-29 NOTE — Progress Notes (Signed)
 Echocardiogram 2D Echocardiogram has been performed.  [name] 04/29/2024, 11:38 AM

## 2024-04-29 NOTE — Evaluation (Addendum)
 Physical Therapy Evaluation Patient Details Name: Deborah Coffey MRN: [id number] DOB: Dec 02, 1955 Today's Date: 04/29/2024  History of Present Illness  69 y.o. female presents to [facility] 04/27/24 with heart failure exacerbation. Chest x-ray showed cardiomegaly, mild hilar vascular congestion, and small L pleural effusion.  PMHx: CAD s/p CABG, CHF, HTN, HLD, depression, Tobacco use , T2DM, CKD, TIA, cva, pulmonary HTN, OSA   Clinical Impression  PTA pt was independent for mobility with occasional use of rollator. Pt presents close to mobility baseline requiring MinA to stand and supervision to ambulate 186ft with RW. Very slow and effortful appearing gait pattern, however, pt reported this is her baseline. Pt has intermittent assist available upon d/c home. Will continue to follow acutely, however, anticipate no post-acute PT needs as she is close to baseline.         If plan is discharge home, recommend the following: Assist for transportation;Help with stairs or ramp for entrance   Can travel by private vehicle    Yes    Equipment Recommendations None recommended by PT     Functional Status Assessment Patient has had a recent decline in their functional status and demonstrates the ability to make significant improvements in function in a reasonable and predictable amount of time.     Precautions / Restrictions Precautions Precautions: Fall Recall of Precautions/Restrictions: Intact Restrictions Weight Bearing Restrictions Per Provider Order: No      Mobility  Bed Mobility Overal bed mobility: Needs Assistance Bed Mobility: Supine to Sit     Supine to sit: Supervision, HOB elevated          Transfers Overall transfer level: Needs assistance Equipment used: Rolling walker (2 wheels) Transfers: Sit to/from Stand, Bed to chair/wheelchair/BSC Sit to Stand: Min assist   Step pivot transfers: Supervision    General transfer comment: use of momentum with MinA for  slight-boost up. Cueing for hand placement as pt was attempting to push with B hands on RW handles    Ambulation/Gait Ambulation/Gait assistance: Supervision Gait Distance (Feet): 100 Feet Assistive device: Rolling walker (2 wheels) Gait Pattern/deviations: Step-through pattern, Decreased stance time - left, Trunk flexed Gait velocity: decr    General Gait Details: slowed step through pattern with light UE support. Cues for upright posture and for shoulder depression     Balance Overall balance assessment: Needs assistance Sitting-balance support: Feet supported, No upper extremity supported Sitting balance-Leahy Scale: Good     Standing balance support: Bilateral upper extremity supported, During functional activity, Reliant on assistive device for balance Standing balance-Leahy Scale: Poor Standing balance comment: reliant on RW during gait          Pertinent Vitals/Pain Pain Assessment Pain Assessment: No/denies pain    Home Living Family/patient expects to be discharged to:: Private residence Living Arrangements: Children Available Help at Discharge: Family;Available PRN/intermittently Type of Home: House Home Access: Level entry    Home Layout: One level Home Equipment: Architectural Technologist (4 wheels);Shower seat      Prior Function Prior Level of Function : Independent/Modified Independent    Mobility Comments: walks with rollator at times       Extremity/Trunk Assessment   Upper Extremity Assessment Upper Extremity Assessment: Defer to OT evaluation    Lower Extremity Assessment Lower Extremity Assessment: Generalized weakness    Cervical / Trunk Assessment Cervical / Trunk Assessment: Normal  Communication   Communication Communication: No apparent difficulties    Cognition Arousal: Alert Behavior During Therapy: WFL for tasks assessed/performed   PT -  Cognitive impairments: No apparent impairments  Following commands: Intact        Cueing Cueing Techniques: Verbal cues     General Comments General comments (skin integrity, edema, etc.): VSS on RA     PT Assessment Patient needs continued PT services  PT Problem List Decreased strength;Decreased activity tolerance;Decreased balance;Decreased mobility       PT Treatment Interventions DME instruction;Gait training;Stair training;Functional mobility training;Therapeutic activities;Therapeutic exercise;Balance training;Neuromuscular re-education;Patient/family education    PT Goals (Current goals can be found in the Care Plan section)  Acute Rehab PT Goals Patient Stated Goal: to go home PT Goal Formulation: With patient Time For Goal Achievement: 05/13/24 Potential to Achieve Goals: Good    Frequency Min 1X/week        AM-PAC PT 6 Clicks Mobility  Outcome Measure Help needed turning from your back to your side while in a flat bed without using bedrails?: None Help needed moving from lying on your back to sitting on the side of a flat bed without using bedrails?: A Little Help needed moving to and from a bed to a chair (including a wheelchair)?: A Little Help needed standing up from a chair using your arms (e.g., wheelchair or bedside chair)?: A Little Help needed to walk in hospital room?: A Little Help needed climbing 3-5 steps with a railing? : A Little 6 Click Score: 19    End of Session   Activity Tolerance: Patient tolerated treatment well Patient left: in chair;with call bell/phone within reach;with chair alarm set Nurse Communication: Mobility status PT Visit Diagnosis: Other abnormalities of gait and mobility (R26.89);Muscle weakness (generalized) (M62.81)    Time: 8499-8481 PT Time Calculation (min) (ACUTE ONLY): 18 min   Charges:   PT Evaluation $PT Eval Low Complexity: 1 Low   PT General Charges $$ ACUTE PT VISIT: 1 Visit        Kate ORN, PT, DPT Secure Chat Preferred  Rehab Office [phone]  Kate BRAVO Wendolyn 04/29/2024,  4:15 PM

## 2024-04-29 NOTE — Evaluation (Signed)
 Occupational Therapy Evaluation Patient Details Name: Deborah Coffey MRN: [id number] DOB: [birth year] Today's Date: 04/29/2024   History of Present Illness   69 y.o. female presents to [facility] 04/27/24 with heart failure exacerbation. Chest x-ray showed cardiomegaly, mild hilar vascular congestion, and small L pleural effusion.  PMHx: CAD s/p CABG, CHF, HTN, HLD, depression, Tobacco use , T2DM, CKD, TIA, cva, pulmonary HTN, OSA     Clinical Impressions Pt admitted based on above, and was seen based on problem list below. PTA pt was independent with ADLs and IADLs. Today pt is requiring set up  to min  assist for ADLs. Functional transfers are  min  assist with RW.  Pt limited by decreased activity tolerance, but anticipate will progress well, no follow up OT needs. OT will continue to follow acutely to maximize functional independence.     If plan is discharge home, recommend the following:   A little help with walking and/or transfers;A little help with bathing/dressing/bathroom;Assistance with cooking/housework;Assist for transportation     Functional Status Assessment   Patient has had a recent decline in their functional status and demonstrates the ability to make significant improvements in function in a reasonable and predictable amount of time.     Equipment Recommendations   None recommended by OT      Precautions/Restrictions   Precautions Precautions: Fall Recall of Precautions/Restrictions: Intact Restrictions Weight Bearing Restrictions Per Provider Order: No     Mobility Bed Mobility Overal bed mobility: Needs Assistance Bed Mobility: Supine to Sit     Supine to sit: Supervision, HOB elevated     General bed mobility comments: S for safety with lines    Transfers Overall transfer level: Needs assistance Equipment used: Rolling walker (2 wheels) Transfers: Sit to/from Stand, Bed to chair/wheelchair/BSC Sit to Stand: Min assist            General transfer comment: Cues for hand placement, min assist to boost      Balance Overall balance assessment: Needs assistance Sitting-balance support: Feet supported, No upper extremity supported Sitting balance-Leahy Scale: Good     Standing balance support: Bilateral upper extremity supported, During functional activity, Reliant on assistive device for balance Standing balance-Leahy Scale: Poor Standing balance comment: reliant on RW       ADL either performed or assessed with clinical judgement   ADL Overall ADL's : Needs assistance/impaired Eating/Feeding: Set up;Sitting   Grooming: Set up;Sitting           Upper Body Dressing : Set up;Sitting   Lower Body Dressing: Sit to/from stand;Minimal assistance Lower Body Dressing Details (indicate cue type and reason): Able to figure 4 BLEs Toilet Transfer: Minimal assistance;Rolling walker (2 wheels)           Functional mobility during ADLs: Minimal assistance;Rolling walker (2 wheels) General ADL Comments: Limited by decreased activity tolerance     Vision Baseline Vision/History: 0 No visual deficits Patient Visual Report: No change from baseline Vision Assessment?: No apparent visual deficits            Pertinent Vitals/Pain Pain Assessment Pain Assessment: No/denies pain     Extremity/Trunk Assessment Upper Extremity Assessment Upper Extremity Assessment: Generalized weakness   Lower Extremity Assessment Lower Extremity Assessment: Defer to PT evaluation   Cervical / Trunk Assessment Cervical / Trunk Assessment: Normal   Communication Communication Communication: No apparent difficulties   Cognition Arousal: Alert Behavior During Therapy: WFL for tasks assessed/performed Cognition: No apparent impairments       OT -  Cognition Comments: mild delayed processing         Following commands: Intact       Cueing  General Comments   Cueing Techniques: Verbal cues  VSS on RA            Home Living Family/patient expects to be discharged to:: Private residence Living Arrangements: Children Available Help at Discharge: Family;Available PRN/intermittently Type of Home: House Home Access: Level entry     Home Layout: One level     Bathroom Shower/Tub: Chief Strategy Officer: Standard Bathroom Accessibility: Yes How Accessible: Accessible via walker Home Equipment: Cane - quad;Rollator (4 wheels);Shower seat          Prior Functioning/Environment Prior Level of Function : Independent/Modified Independent       Mobility Comments: walks with rollator at times ADLs Comments: ind    OT Problem List: Decreased strength;Impaired balance (sitting and/or standing);Cardiopulmonary status limiting activity   OT Treatment/Interventions: Self-care/ADL training;Therapeutic exercise;DME and/or AE instruction;Energy conservation;Therapeutic activities;Patient/family education;Balance training      OT Goals(Current goals can be found in the care plan section)   Acute Rehab OT Goals Patient Stated Goal: To go home OT Goal Formulation: With patient Time For Goal Achievement: 05/13/24 Potential to Achieve Goals: Good   OT Frequency:  Min 2X/week       AM-PAC OT 6 Clicks Daily Activity     Outcome Measure Help from another person eating meals?: None Help from another person taking care of personal grooming?: A Little Help from another person toileting, which includes using toliet, bedpan, or urinal?: A Little Help from another person bathing (including washing, rinsing, drying)?: A Little Help from another person to put on and taking off regular upper body clothing?: A Little Help from another person to put on and taking off regular lower body clothing?: A Little 6 Click Score: 19   End of Session Equipment Utilized During Treatment: Rolling walker (2 wheels) Nurse Communication: Mobility status  Activity Tolerance: Patient tolerated treatment  well Patient left: in chair;with call bell/phone within reach;with chair alarm set  OT Visit Diagnosis: Unsteadiness on feet (R26.81);Other abnormalities of gait and mobility (R26.89);Muscle weakness (generalized) (M62.81)                Time: 8542-8482 OT Time Calculation (min): 20 min Charges:  OT General Charges $OT Visit: 1 Visit OT Evaluation $OT Eval Moderate Complexity: 1 Mod  Adrianne BROCKS, OT  Acute Rehabilitation Services Office [phone] Secure chat preferred   Adrianne GORMAN Savers 04/29/2024, 4:53 PM

## 2024-04-30 ENCOUNTER — Other Ambulatory Visit (HOSPITAL_COMMUNITY): Payer: Self-pay

## 2024-04-30 DIAGNOSIS — I2510 Atherosclerotic heart disease of native coronary artery without angina pectoris: Secondary | ICD-10-CM | POA: Diagnosis not present

## 2024-04-30 DIAGNOSIS — I10 Essential (primary) hypertension: Secondary | ICD-10-CM | POA: Diagnosis not present

## 2024-04-30 DIAGNOSIS — N1832 Chronic kidney disease, stage 3b: Secondary | ICD-10-CM | POA: Diagnosis not present

## 2024-04-30 DIAGNOSIS — I5033 Acute on chronic diastolic (congestive) heart failure: Secondary | ICD-10-CM | POA: Diagnosis not present

## 2024-04-30 LAB — GLUCOSE, CAPILLARY
Glucose-Capillary: 196 mg/dL — ABNORMAL HIGH (ref 70–99)
Glucose-Capillary: 191 mg/dL — ABNORMAL HIGH (ref 70–99)
Glucose-Capillary: 232 mg/dL — ABNORMAL HIGH (ref 70–99)
Glucose-Capillary: 213 mg/dL — ABNORMAL HIGH (ref 70–99)

## 2024-04-30 LAB — MAGNESIUM: Magnesium: 2.2 mg/dL (ref 1.7–2.4)

## 2024-04-30 LAB — BASIC METABOLIC PANEL WITH GFR
Anion gap: 9 (ref 5–15)
BUN: 39 mg/dL — ABNORMAL HIGH (ref 8–23)
CO2: 29 mmol/L (ref 22–32)
Calcium: 9.4 mg/dL (ref 8.9–10.3)
Chloride: 99 mmol/L (ref 98–111)
Creatinine, Ser: 1.94 mg/dL — ABNORMAL HIGH (ref 0.44–1.00)
GFR, Estimated: 28 mL/min — ABNORMAL LOW
Glucose, Bld: 152 mg/dL — ABNORMAL HIGH (ref 70–99)
Potassium: 3.9 mmol/L (ref 3.5–5.1)
Sodium: 137 mmol/L (ref 135–145)

## 2024-04-30 MED ORDER — METOLAZONE 5 MG PO TABS
5.0000 mg | ORAL_TABLET | Freq: Once | ORAL | Status: AC
  Administered 2024-04-30: 5 mg via ORAL
  Filled 2024-04-30: qty 1

## 2024-04-30 MED ORDER — POTASSIUM CHLORIDE CRYS ER 20 MEQ PO TBCR
40.0000 meq | EXTENDED_RELEASE_TABLET | Freq: Once | ORAL | Status: AC
  Administered 2024-04-30: 40 meq via ORAL
  Filled 2024-04-30: qty 2

## 2024-04-30 NOTE — Progress Notes (Signed)
 Heart Failure Navigator Progress Note  Assessed for Heart & Vascular TOC clinic readiness.  Patient does not meet criteria due to she is an Advanced Heart Failure Team patient of Dr. Bensimhon. .   Navigator will sign off at this time.   Stephane Haddock, BSN, Scientist, Clinical (histocompatibility And Immunogenetics) Only

## 2024-04-30 NOTE — Inpatient Diabetes Management (Signed)
 Inpatient Diabetes Program Recommendations  AACE/ADA: New Consensus Statement on Inpatient Glycemic Control (2015)  Target Ranges:  Prepandial:   less than 140 mg/dL      Peak postprandial:   less than 180 mg/dL (1-2 hours)      Critically ill patients:  140 - 180 mg/dL   Lab Results  Component Value Date   GLUCAP 191 (H) 04/30/2024   HGBA1C 6.1 (H) 12/11/2023    Review of Glycemic Control  Latest Reference Range & Units 04/29/24 15:33 04/29/24 21:07 04/30/24 07:51 04/30/24 12:00  Glucose-Capillary 70 - 99 mg/dL 89 830 (H) 803 (H) 808 (H)   Diabetes history: DM 2 Outpatient Diabetes medications:  Dexcom G7 Toujeo  48 units daily Metformin  1000 mg bid Current orders for Inpatient glycemic control:  Novolog  0-9 units tid with meals  Inpatient Diabetes Program Recommendations:    Note hypoglycemia on 04/29/24.  Agree with reduction in Novolog  correction to sensitive (0-9 units) tid with meals.  Patient is on long acting insulin  (Toujeo  48 units daily) at home and may need partial dose restarted while in hospital.  Thanks,  Randall Bullocks, RN, BC-ADM Inpatient Diabetes Coordinator Pager [phone]  (8a-5p)

## 2024-04-30 NOTE — Plan of Care (Signed)
" ° °  Problem: Clinical Measurements: Goal: Ability to maintain clinical measurements within normal limits will improve Outcome: Progressing   Problem: Clinical Measurements: Goal: Will remain free from infection Outcome: Progressing   Problem: Clinical Measurements: Goal: Respiratory complications will improve Outcome: Progressing   Problem: Clinical Measurements: Goal: Cardiovascular complication will be avoided Outcome: Progressing   Problem: Activity: Goal: Risk for activity intolerance will decrease Outcome: Progressing   Problem: Elimination: Goal: Will not experience complications related to urinary retention Outcome: Progressing   Problem: Skin Integrity: Goal: Risk for impaired skin integrity will decrease Outcome: Progressing     "

## 2024-04-30 NOTE — Progress Notes (Signed)
 Orthopedic Tech Progress Note Patient Details:  Deborah Coffey 07-10-1955 [id number] Change UB 1/15 Ortho Devices Type of Ortho Device: Radio broadcast assistant Ortho Device/Splint Location: BLE Ortho Device/Splint Interventions: Ordered, Application, Adjustment   Post Interventions Patient Tolerated: Well Instructions Provided: Care of device  [name] 04/30/2024, 10:59 AM

## 2024-04-30 NOTE — Progress Notes (Signed)
 " Progress Note   Patient: Deborah Coffey FMW:[id number] DOB: 23-Sep-1955 DOA: 04/27/2024     3 DOS: the patient was seen and examined on 04/30/2024   Brief hospital course: Deborah Coffey was admitted to the hospital with the working diagnosis of heart failure exacerbation   69 yo female with the past medical history of heart failure, pulmonary hypertension, coronary artery disease, hyperlipidemia, obesity and depression who presented with dyspnea.  Reported 2 weeks of progressive dyspnea on exertion, and weight gain, despite taking an increased dose of furosemide , 80 mg po bid. On the day of admission she called her cardiologist office, and was advised to come to the ED.  On her initial physical examination her blood pressure was 139/72, HR 99, RR 18 and 02 saturation 97%, lungs with no wheezing or rhonchi, heart with S1 and S2 present and regular, with no gallops, abdomen with no distention and positive lower extremity edema.   Na 137, K 3.9 Cl 101 bicarbonate 26 glucose 125, bun 31 cr 1,53  Mg 1,9  Pro BNP 12,483  High sensitive troponin 30 and 30  Wbc 6,4 hgb 8,5 plt 307   Chest radiograph with cardiomegaly, with mild hilar vascular congestion, small left pleural effusion.   EKG 84 bpm, right axis deviation, normal intervals, qtc 452, sinus rhythm with no significant ST segment changes negative T wave lead V1 and V2.   Patient was placed on furosemide  for diuresis.   01/11 continue volume overloaded.  01/12 slow response to diuresis.   Assessment and Plan: * Acute on chronic diastolic CHF (congestive heart failure) (HCC) Echocardiogram with preserved LV systolic function with EF 55 to 60%, interventricular septum is flattened in systole and diastole, RV systolic function with moderate reduction, RV with moderate to severe cavity enlargement, RVSP 82.6 mmHg, RA with severe enlargement, severe tricuspid regurgitation, moderate pulmonic regurgitation.   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  As outpatient patient was sildenafil , then transitioned to Opsynvi , then sotatercept, but this lat one not able to get because not stable home. 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   CAD (coronary artery disease) - CABG x 1 (LIMA-LAD) High sensitive troponin elevation due to heart failure and volume overload, no acute coronary syndrome  Essential hypertension Continue close blood pressure monitoring Continue diuresis   Chronic kidney disease, stage 3b (HCC) Continue volume overloaded, renal function today with serum cr at 1,94 with K at 3,9 and serum bicarbonate at 29  Na 137 and Mg 2,2   Continue loop and thiazide diuretic, follow up renal function in am.  If persistent volume overload will consider acetazolamide.   Type 2 diabetes mellitus with hyperlipidemia (HCC) Continue glucose cover and monitoring with insulin  sliding scale, glucose has been well controlled Fasting glucose today 152 mg/dl  Continue statin therapy   H/O TIA (transient ischemic attack) and stroke Continue blood pressure monitoring  Continue asa and statin   Depression Continue with bupropion    Obesity, class 2 Calculated BMI 38.6       Subjective: Patient with no chest pain, dyspnea and edema have improved  but not yet back to baseline,   Physical Exam: Vitals:   04/29/24 1946 04/29/24 2338 04/30/24 0525 04/30/24 0756  BP: 115/60 (!) 100/55 106/65   Pulse: 96 98 99   Resp: 18 16 20 19   Temp: 98 F (36.7 C) 98.3 F (36.8 C) 98 F (36.7 C) 98 F (36.7 C)  TempSrc: Oral Oral Oral Oral  SpO2: 97%  98% 99%   Weight:      Height:       Neurology awake and alert, deconditioned ENT with mild pallor with no icterus Cardiovascular with S1 and S2 present and regular with positive systolic murmur at the left lower sternal border Positive JVD Respiratory with rales at bases with no wheezing or rhonchi Abdomen protuberant, not tender and not distended Positive lower extremity edema pitting +++  Data Reviewed:    Family Communication: no family at the bedside   Disposition: Status is: Inpatient Remains inpatient appropriate because: IV diuresis   Planned Discharge Destination: Home     Author: Elidia Toribio Furnace, MD 04/30/2024 10:04 AM  For on call review [email or web address].  "

## 2024-04-30 NOTE — Plan of Care (Signed)

## 2024-04-30 NOTE — TOC Initial Note (Signed)
 Transition of Care [facility]) - Initial/Assessment Note    Patient Details  Name: Deborah Coffey MRN: [id number] Date of Birth: 14-Aug-1955  Transition of Care [facility]) CM/SW Contact:    Arlana JINNY Nicholaus [place] Phone Number: [phone] 04/30/2024, 1:14 PM  Clinical Narrative:    HF CSW met with patient at bedside. Patient stated that she lives with her daughter and her 3 grandchildren. Patient stated that she can drive, but relies on her daughter to assist with transportation. Patient state that she does no have history of HH services. Patient stated that she uses a walker and rollator. Patient stated that she has a scale at home. Patient stated that she has a PCP. CSW explained that a hospital follow up appointment is typically scheduled closer towards dc. Patient is agreeable.   HF CSW/CM will continue to follow and monitor  for dc readiness.              Expected Discharge Plan: Home/Self Care Barriers to Discharge: Continued Medical Work up   Patient Goals and CMS Choice            Expected Discharge Plan and Services       Living arrangements for the past 2 months: Single Family Home                 DME Arranged: Walker                    Prior Living Arrangements/Services Living arrangements for the past 2 months: Single Family Home Lives with:: Adult Children, Relatives Patient language and need for interpreter reviewed:: Yes Do you feel safe going back to the place where you live?: Yes      Need for Family Participation in Patient Care: Yes (Comment) Care giver support system in place?: Yes (comment)   Criminal Activity/Legal Involvement Pertinent to Current Situation/Hospitalization: No - Comment as needed  Activities of Daily Living   ADL Screening (condition at time of admission) Independently performs ADLs?: Yes (appropriate for developmental age) Is the patient deaf or have difficulty hearing?: No Does the patient have difficulty seeing, even when  wearing glasses/contacts?: No Does the patient have difficulty concentrating, remembering, or making decisions?: No  Permission Sought/Granted Permission sought to share information with : Family Supports, PCP                Emotional Assessment Appearance:: Appears stated age Attitude/Demeanor/Rapport: Engaged Affect (typically observed): Appropriate Orientation: : Oriented to Self, Oriented to Place, Oriented to  Time, Oriented to Situation Alcohol / Substance Use: Not Applicable Psych Involvement: No (comment)  Admission diagnosis:  Acute exacerbation of CHF (congestive heart failure) (HCC) [I50.9] Acute on chronic congestive heart failure, unspecified heart failure type (HCC) [I50.9] Patient Active Problem List   Diagnosis Date Noted   Acute exacerbation of CHF (congestive heart failure) (HCC) 04/27/2024   Urinary urgency 02/02/2024   Hypotension 01/29/2024   Elevated troponin 01/29/2024   History of CAD (coronary artery disease) 01/29/2024   Postural dizziness with presyncope 01/29/2024   History of depression 01/29/2024   Continuous dependence on cigarette smoking 01/29/2024   Insulin  dependent diabetes mellitus type IA (HCC) 01/29/2024   Chronic kidney disease, stage 3b (HCC) 01/29/2024   Type B lactic acidosis 01/29/2024   Other fatigue 12/15/2023   Acute on chronic systolic congestive heart failure (HCC) 12/15/2023   Acute on chronic diastolic CHF (congestive heart failure) (HCC) 12/14/2023   Anemia 12/11/2023   Restrictive lung disease 08/22/2023  Respiratory bronchiolitis interstitial lung disease (HCC) 03/25/2023   Lung nodule 03/25/2023   Gout 05/03/2022   Obesity, class 2 05/03/2022   Atherosclerotic heart disease of native coronary artery with other forms of angina pectoris 05/03/2022   Obstructive sleep apnea 08/31/2021   Pulmonary hypertension (HCC) 04/10/2021   Facial numbness 09/03/2016   Chronic diastolic CHF (congestive heart failure) (HCC)  09/03/2016   Anxiety    Eczema 01/29/2016   History of adenomatous polyp of colon 10/01/2015   Floaters in visual field 09/04/2015   DOE (dyspnea on exertion) 06/26/2015   Screening for colon cancer 06/26/2015   Type 2 diabetes mellitus with complication, with long-term current use of insulin  (HCC) 02/27/2015   Diabetic mononeuropathy associated with type 2 diabetes mellitus (HCC) 02/27/2015   LLQ abdominal pain    AKI (acute kidney injury) 08/07/2014   Type 2 diabetes mellitus with hyperlipidemia (HCC) 07/08/2014   Preventative health care 07/08/2014   Depression 07/08/2014   Essential hypertension 01/10/2014   Depression 01/10/2014   H/O TIA (transient ischemic attack) and stroke 01/10/2014   Visual changes 01/02/2014   Facial weakness 01/02/2014   Conversion disorder 01/01/2014   Tobacco use 06/20/2013   Diabetic retinopathy (HCC) 04/03/2013   Chest pressure 03/22/2013   Essential hypertension, benign 02/14/2013   Accelerated hypertension 09/19/2012   Uncontrolled diabetes mellitus 09/19/2012   Hyperlipidemia 09/19/2012   CAD (coronary artery disease) - CABG x 1 (LIMA-LAD) 02/21/2004   PCP:  [name], MD Pharmacy:   [id number] [address] ([address]), [place] - [address] [place] ([place]) [place] [address] Phone: [phone] Fax: [phone]  [facility] - [place], [place] - [address] [place] [address] Phone: [phone] Fax: [phone]  [facility] [address] [place] [address] Phone: [phone] Fax: [phone]  [facility] [place], [place] - [address] [place] [address] Phone: [phone] Fax: [phone]  [phone] [place] [place] - [address] AT [facility] [address] [place] [address] Phone: [phone]  Fax: [phone]     Social Drivers of Health (SDOH) Social History: SDOH Screenings   Food Insecurity: No Food Insecurity (04/28/2024)  Housing: Low Risk (04/29/2024)  Transportation Needs: Unmet Transportation Needs (04/28/2024)  Utilities: Not At Risk (04/28/2024)  Alcohol Screen: Low Risk (08/23/2023)  Depression (PHQ2-9): Medium Risk (01/02/2024)  Financial Resource Strain: Low Risk (08/23/2023)  Physical Activity: Inactive (08/23/2023)  Social Connections: Moderately Isolated (04/28/2024)  Stress: No Stress Concern Present (08/23/2023)  Tobacco Use: High Risk (04/29/2024)  Health Literacy: Adequate Health Literacy (08/23/2023)   SDOH Interventions:     Readmission Risk Interventions    01/30/2024    2:32 PM  Readmission Risk Prevention Plan  Transportation Screening Complete  Home Care Screening Complete  Medication Review (RN CM) Complete

## 2024-04-30 NOTE — Consult Note (Addendum)
 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 Noralee, Internal Medicine.   Deborah Coffey is a 69 y.o. with CAD s/p CABG x1 (LIMA-LAD) 2005, tobacco use, chronic diastolic CHF, anemia, depression, HLD, HTN, DM, CKD stage 3b, prior TIAs, pulmonary HTN (felt due to underlying lung disease/smoking), and morbid obesity.    Echo12/22 EF 65-70% RV moderately dilated with moderate HK. Flattened septum Moderate TR RVSP est 110 mmHG     Chest CT scan 2/22. No PE   R/L cath on 04/16/21: - mLAD 75% LIMA to LAD is widely patent. pRCA 50% - EF 65% - RA 5 PA 79/18 (39) PCW 12  CO 6.4 L/min; CI PVR 4.2   Sleep study 06/2021 only c/w mild OSA AHI 5.5   CTD serologies all negative    Echo 11/24 EF 60-65%, IVC flattened in systole and diastole, c/w RV pressure and volume overload.  RV severely enlarged w/ severely reduced systolic function. The estimated RVSP is 92.5 mmHg (comparred to 111 mmHg previous study). RA severely dilated. Moderate TR    PFTs 12/24: FEV1 1.27 (50%) FVC 1.84 (55),ratio 77, DLCOcor 48. Moderately severe restriction with reversibility (12% change) and severe diffusion defect. She is being followed by [facility] (Dr. Annella). He has also being following her for pulmonary nodules.   Echo 11/24 EF 60-65% RV mod reduced RVSP 92    RHC 3/25 demonstrated severe PAH w/ cor pulmonale, suspect Group 1 and possibly Group 3. She was started on Sildenafil  20 tid.  RA = 4, RV = 77/6, PA =  78/19 (42), PCW = 6, Fick CO/CI = 3.9/1.9, Thermo CO/CI = 3.6/1.7, PVR = 9.3 (Fick) 10.0 (TD), Ao sat = 96%, PA sat = 57%, 58%, PAPi = 14.8]   10/2023 Placed on Opsynvi    Admitted with 8/25 A/C HFpEF. Echo LVEF 60-65%, septal flattening in systole and diastole, RV severely enlarged and  moderately HK, RVSP 133 mmHg, RA severe dilated, severe TR, dilated IVC. Diuresed with IV lasix  and transitioned to lasix  60 mg twice a day. Digoxin  added. At discharge Opsynvi  restarted.    Admitted 10/25 for chest and abdominal pan felt to be related to anxiety attack. Cardiac workup was unremarkable, although possible that this was caused by pHTN after explanation from patient at f/u office visit. CT dissection negative.  At 11/25 clinic f/u, pt had reported being off of Opsynvi . She had stopped taking b/c she felt it was not working and did not want to restart. We were working to get her onto sotatercept, however pt ws struggling to find a place to mail medication; only had PO box as she was living in a motel for 4 months. Also difficulty getting to clinic d/t lack of transpiration. Farxiga  was also discontinued d/t UTI.   She has now been readmitted w/ a/c RV failure w/ volume overload. AHF team asked to further assist.   Currently getting IV Lasix  80 mg q12. She reports good UOP and subjective interval improvement in LEE and dyspnea since being admitted.   Most recent SCr baseline has been 1.5-2.0.   SCr this admit 1.53>>1.47>>1.94. SBPs low 100s.   She reports being compliant w/ her home meds/ diuretics but admits to dietary indiscretion w/ sodium. She was eating takeout d/t living in a motel x  4 months.   She now has stable housing and living in a new home w/ her daughter, just moved in 3 wks ago. She would like to get started on sotatercept.   Home Medications Prior to Admission medications  Medication Sig Start Date End Date Taking? Authorizing Provider  acetaminophen  (TYLENOL ) 500 MG tablet Take 1,000 mg by mouth every 6 (six) hours as needed for headache or moderate pain (pain score 4-6).   Yes [provider]  aspirin  EC 81 MG tablet Take 81 mg by mouth daily.   Yes [provider]  atorvastatin  (LIPITOR) 10 MG tablet Take 1 tablet (10 mg total) by mouth daily.  02/08/24  Yes Newlin, Enobong, MD  CALCIUM -MAGNESIUM -ZINC PO Take 1 tablet by mouth daily.   Yes [provider]  diclofenac  Sodium (VOLTAREN ) 1 % GEL Apply 2 g topically daily as needed (muscle pain). 02/02/23  Yes Newlin, Enobong, MD  digoxin  (LANOXIN ) 0.125 MG tablet Take 1 tablet (0.125 mg total) by mouth daily. 01/03/24  Yes Bensimhon, Toribio SAUNDERS, MD  furosemide  (LASIX ) 40 MG tablet TAKE 1 AND 1/2 TABLETS TWICE DAILY Patient taking differently: Take 80 mg by mouth 2 (two) times daily. 04/16/24  Yes Bensimhon, Toribio SAUNDERS, MD  gabapentin  (NEURONTIN ) 300 MG capsule Take 300 mg by mouth 2 (two) times daily.   Yes [provider]  insulin  glargine, 2 Unit Dial , (TOUJEO  MAX SOLOSTAR) 300 UNIT/ML Solostar Pen Inject 46 Units into the skin at bedtime. Patient taking differently: Inject 48 Units into the skin at bedtime. 02/08/24  Yes Newlin, Enobong, MD  metFORMIN  (GLUCOPHAGE -XR) 500 MG 24 hr tablet Take 1,000 mg by mouth 2 (two) times daily. 01/03/24  Yes [provider]  Multiple Vitamin (MULTIVITAMIN WITH MINERALS) TABS tablet Take 1 tablet by mouth daily.   Yes [provider]  nitroGLYCERIN  (NITROSTAT ) 0.4 MG SL tablet Place 1 tablet (0.4 mg total) under the tongue every 5 (five) minutes as needed for chest pain. 03/03/23  Yes Vonna Sharlet POUR, MD  pantoprazole  (PROTONIX ) 40 MG tablet Take 1 tablet (40 mg total) by mouth 2 (two) times daily. 02/08/24  Yes Newlin, Enobong, MD  vitamin B-12 (CYANOCOBALAMIN ) 50 MCG tablet Take 1,000 mcg by mouth daily.   Yes [provider]  Vitamin D , Cholecalciferol, 25 MCG (1000 UT) TABS Take 1,000 mcg by mouth daily.   Yes [provider]  Alcohol Swabs (B-D SINGLE USE SWABS REGULAR) PADS USE AS DIRECTED 04/13/17   Jegede, Olugbemiga E, MD  Blood Glucose Monitoring Suppl (ONETOUCH VERIO) w/Device KIT Use as instructed to test blood glucose 3 times daily. 02/05/22   Newlin, Enobong, MD  buPROPion  (WELLBUTRIN  SR)  150 MG 12 hr tablet Take 1 tablet (150 mg total) by mouth 2 (two) times daily. For smoking cessation Patient not taking: Reported on 04/28/2024 02/17/24   Newlin, Enobong, MD  Continuous Glucose Receiver (DEXCOM G7 RECEIVER) DEVI USE AS DIRECTED 03/02/24   Newlin, Enobong, MD  Continuous Glucose Sensor (DEXCOM G7 SENSOR) MISC Use to check blood glucose throughout the day. Changes sensors once every 10 days. 01/03/24   Newlin, Enobong, MD  DROPLET PEN NEEDLES 31G X 8 MM MISC USE WITH TOUJEO  SOLOSTAR PEN EVERY DAY 02/09/24   Newlin, Enobong, MD  glucose blood (ONETOUCH VERIO) test strip Use as instructed to test blood glucose 3 times daily. 02/05/22   Newlin, Enobong, MD  Lancets (ONETOUCH DELICA PLUS [place]) MISC Use as instructed to test blood glucose 3 times daily. 02/05/22  [name], MD    Past Medical History: Past Medical History:  Diagnosis Date   Allergy    Anemia    CAD in native artery 02/18/2004   Mid LAD lesoin just after Large D1 --> CABG x 1 LIMA-LAD   CAP (community acquired pneumonia) 08/07/2014   CHF (congestive heart failure) (HCC)    Chronic kidney disease, stage 3b (HCC)    Depression    High cholesterol    History of blood transfusion 04/20/2003   related to OHS    Hypertension    Lung nodules    Mild carotid artery disease    Pulmonary hypertension (HCC)    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 competitive flow   TIA (transient ischemic attack)    Type II diabetes mellitus (HCC)     Past Surgical History: Past Surgical History:  Procedure Laterality Date   CARDIAC CATHETERIZATION  2010   Previous LAD 95% lesion - now ~30-40%; patent LIMA with competitive flow   CESAREAN SECTION  1977; 1989   CHOLECYSTECTOMY  ~ 2012   CORONARY ARTERY BYPASS GRAFT  02/2004   CABG X1 (03/22/2013); LIMA-LAD   RIGHT HEART CATH N/A 07/14/2023   Procedure: RIGHT HEART CATH;  Surgeon: Cherrie Toribio SAUNDERS, MD;   Location: [facility] INVASIVE CV LAB;  Service: Cardiovascular;  Laterality: N/A;   RIGHT HEART CATH N/A 02/16/2024   Procedure: RIGHT HEART CATH;  Surgeon: Cherrie Toribio SAUNDERS, MD;  Location: [facility] INVASIVE CV LAB;  Service: Cardiovascular;  Laterality: N/A;   RIGHT/LEFT HEART CATH AND CORONARY/GRAFT ANGIOGRAPHY N/A 04/16/2021   Procedure: RIGHT/LEFT HEART CATH AND CORONARY/GRAFT ANGIOGRAPHY;  Surgeon: Dann Candyce RAMAN, MD;  [facility]: [facility] INVASIVE CV LAB;  Service: Cardiovascular;  Laterality: N/A;   TUBAL LIGATION  1989    Family History: Family History  Problem Relation Age of Onset   Heart disease Mother    Diabetes Mother    Cancer Mother        cervical   Diabetes Father    Diabetes Sister    Heart disease Sister    Lupus Sister    Heart failure Sister    Hepatitis C Sister    Hypertension Daughter    Diabetes Brother    Heart attack Brother    Heart failure Brother    Diabetes Brother    Colon cancer Neg Hx    Breast cancer Neg Hx     Social History: Social History   Socioeconomic History   Marital status: Divorced    Spouse name: Not on file   Number of children: Not on file   Years of education: 12   Highest education level: Some college, no degree  Occupational History   Occupation: Disabled  Tobacco Use   Smoking status: Every Day    Current packs/day: 0.25    Average packs/day: 0.5 packs/day for 51.0 years (25.3 ttl pk-yrs)    Types: Cigarettes    Start date: 1975   Smokeless tobacco: Current   Tobacco comments:    10 Cigarettes a day. 08/22/2023  Vaping Use   Vaping status: Some Days   Substances: Flavoring  Substance and Sexual Activity   Alcohol use: Not Currently   Drug use: No    Types: Marijuana    Comment: smoked pot years ago but not currently   Sexual activity: Not Currently  Other Topics Concern   Not on file  Social History Narrative  Lives with daughter and 3 grandchildren   Social Drivers of Health   Tobacco Use: High Risk (04/29/2024)    Patient History    Smoking Tobacco Use: Every Day    Smokeless Tobacco Use: Current    Passive Exposure: Not on file  Financial Resource Strain: Low Risk (08/23/2023)   Overall Financial Resource Strain (CARDIA)    Difficulty of Paying Living Expenses: Not hard at all  Food Insecurity: No Food Insecurity (04/28/2024)   Epic    Worried About Programme Researcher, Broadcasting/film/video in the Last Year: Never true    Ran Out of Food in the Last Year: Never true  Transportation Needs: Unmet Transportation Needs (04/28/2024)   Epic    Lack of Transportation (Medical): Yes    Lack of Transportation (Non-Medical): Yes  Physical Activity: Inactive (08/23/2023)   Exercise Vital Sign    Days of Exercise per Week: 0 days    Minutes of Exercise per Session: 0 min  Stress: No Stress Concern Present (08/23/2023)   [employer] of Occupational Health - Occupational Stress Questionnaire    Feeling of Stress : Not at all  Social Connections: Moderately Isolated (04/28/2024)   Social Connection and Isolation Panel    Frequency of Communication with Friends and Family: Once a week    Frequency of Social Gatherings with Friends and Family: Once a week    Attends Religious Services: 1 to 4 times per year    Active Member of [employer] or Organizations: Yes    Attends Banker Meetings: 1 to 4 times per year    Marital Status: Widowed  Depression (PHQ2-9): Medium Risk (01/02/2024)   Depression (PHQ2-9)    PHQ-2 Score: 5  Alcohol Screen: Low Risk (08/23/2023)   Alcohol Screen    Last Alcohol Screening Score (AUDIT): 0  Housing: Low Risk (04/29/2024)   Epic    Unable to Pay for Housing in the Last Year: No    Number of Times Moved in the Last Year: 1    Homeless in the Last Year: No  Utilities: Not At Risk (04/28/2024)   Epic    Threatened with loss of utilities: No  Health Literacy: Adequate Health Literacy (08/23/2023)   B1300 Health Literacy    Frequency of need for help with medical instructions: Never     Allergies:  Allergies[1]  Objective:   Vital Signs:   Temp:  [97.5 F (36.4 C)-98.3 F (36.8 C)] 98 F (36.7 C) (01/12 0756) Pulse Rate:  [93-99] 99 (01/12 0525) Resp:  [16-20] 19 (01/12 0756) BP: (100-131)/(55-79) 106/65 (01/12 0525) SpO2:  [97 %-100 %] 99 % (01/12 0525) Last BM Date :  (PTA)  Weight change: Filed Weights   04/28/24 1320  Weight: 102 kg    Intake/Output:   Intake/Output Summary (Last 24 hours) at 04/30/2024 1124 Last data filed at 04/30/2024 0530 Gross per 24 hour  Intake 290 ml  Output 1600 ml  Net -1310 ml    Physical Exam  General:  well appearing. obese Cor: Regular rate & rhythm. 2/6 TR murmur. JVD elevated to jaw  Lungs: clear Extremities: 1+ b/l LEE up to knees, b/l unna boots   Telemetry   NSR 70s, personally reviewed   EKG    NSR 84 bpm, personally reviewed   Labs   Basic Metabolic Panel: Recent Labs  Lab 04/27/24 1935 04/28/24 0241 04/29/24 0501 04/30/24 0453  NA 137 139 139 137  K 3.9 3.5 4.1 3.9  CL 101 103  100 99  CO2 26 26 29 29   GLUCOSE 125* 92 190* 152*  BUN 31* 28* 34* 39*  CREATININE 1.53* 1.47* 1.94* 1.94*  CALCIUM  9.4 9.2 9.0 9.4  MG 1.9  --  1.9 2.2    Liver Function Tests: No results for input(s): AST, ALT, ALKPHOS, BILITOT, PROT, ALBUMIN in the last 168 hours. No results for input(s): LIPASE, AMYLASE in the last 168 hours. No results for input(s): AMMONIA in the last 168 hours.  CBC: Recent Labs  Lab 04/27/24 1935 04/28/24 0241  WBC 6.4 6.5  HGB 8.5* 8.2*  HCT 30.6* 29.2*  MCV 75.4* 74.9*  PLT 307 314    Cardiac Enzymes: No results for input(s): CKTOTAL, CKMB, CKMBINDEX, TROPONINI in the last 168 hours.  BNP: BNP (last 3 results) Recent Labs    12/11/23 1313 12/22/23 1035 03/01/24 1538  BNP 673.3* 140.3* 1,434.1*    ProBNP (last 3 results) Recent Labs    04/27/24 1935  PROBNP 12,483.0*     CBG: Recent Labs  Lab 04/29/24 1210 04/29/24 1238  04/29/24 1533 04/29/24 2107 04/30/24 0751  GLUCAP 49* 75 89 169* 196*    Coagulation Studies: Recent Labs    04/28/24 0241  LABPROT 13.6  INR 1.0     Imaging   ECHOCARDIOGRAM COMPLETE Result Date: 04/29/2024    ECHOCARDIOGRAM REPORT   Patient Name:   Deborah Coffey Date of Exam: 04/29/2024 Medical Rec #:  [id number]          Height:       64.0 in Accession #:    [id number]         Weight:       224.9 lb Date of Birth:  1956/01/16         BSA:          2.056 m Patient Age:    68 years           BP:           144/69 mmHg Patient Gender: F                  HR:           89 bpm. Exam Location:  Inpatient Procedure: 2D Echo, Cardiac Doppler, Color Doppler and Intracardiac            Opacification Agent (Both Spectral and Color Flow Doppler were            utilized during procedure). Indications:    Dyspnea R06.00  History:        Patient has prior history of Echocardiogram examinations, most                 recent 01/30/2024. CHF, CAD, Prior CABG, Pulmonary HTN, CKD,                 stage 3 and TIA, Signs/Symptoms:Hypotension and Dyspnea; Risk                 Factors:Hypertension, Diabetes, Sleep Apnea and Current Smoker.  Sonographer:    [name] RCS Referring Phys: [id number] MAURICIO DANIEL ARRIEN IMPRESSIONS  1. Left ventricular ejection fraction, by estimation, is 55 to 60%. The left ventricle has normal function. The left ventricle has no regional wall motion abnormalities. Left ventricular diastolic parameters were normal. There is the interventricular septum is flattened in systole and diastole, consistent with right ventricular pressure and volume overload.  2. Right ventricular systolic function is moderately reduced. The right ventricular size is  moderate to severely enlarged. There is severely elevated pulmonary artery systolic pressure. The estimated right ventricular systolic pressure is 82.6 mmHg.  3. Right atrial size was severely dilated.  4. The mitral valve is normal in  structure. No evidence of mitral valve regurgitation. No evidence of mitral stenosis.  5. Tricuspid valve regurgitation is severe.  6. The aortic valve is normal in structure. Aortic valve regurgitation is not visualized. No aortic stenosis is present.  7. Pulmonic valve regurgitation is moderate.  8. The inferior vena cava is dilated in size with >50% respiratory variability, suggesting right atrial pressure of 8 mmHg. Comparison(s): A prior study was performed on 01/30/2024. LVEF 60-65%, RV pressure and volume overload, mild RAE, severe TR, estimated RAP 8 mmHG. FINDINGS  Left Ventricle: Left ventricular ejection fraction, by estimation, is 55 to 60%. The left ventricle has normal function. The left ventricle has no regional wall motion abnormalities. Definity  contrast agent was given IV to delineate the left ventricular  endocardial borders. The left ventricular internal cavity size was normal in size. There is no left ventricular hypertrophy. The interventricular septum is flattened in systole and diastole, consistent with right ventricular pressure and volume overload. Left ventricular diastolic parameters were normal. Right Ventricle: The right ventricular size is moderate to severely enlarged. Right vetricular wall thickness was not well visualized. Right ventricular systolic function is moderately reduced. There is severely elevated pulmonary artery systolic pressure. The tricuspid regurgitant velocity is 4.32 m/s, and with an assumed right atrial pressure of 8 mmHg, the estimated right ventricular systolic pressure is 82.6 mmHg. Left Atrium: Left atrial size was normal in size. Right Atrium: Right atrial size was severely dilated. Pericardium: There is no evidence of pericardial effusion. Mitral Valve: The mitral valve is normal in structure. No evidence of mitral valve regurgitation. No evidence of mitral valve stenosis. Tricuspid Valve: The tricuspid valve is grossly normal. Tricuspid valve regurgitation  is severe. No evidence of tricuspid stenosis. Aortic Valve: The aortic valve is normal in structure. Aortic valve regurgitation is not visualized. No aortic stenosis is present. Aortic valve peak gradient measures 4.0 mmHg. Pulmonic Valve: The pulmonic valve was grossly normal. Pulmonic valve regurgitation is moderate. No evidence of pulmonic stenosis. Aorta: The aortic root and ascending aorta are structurally normal, with no evidence of dilitation. Venous: The inferior vena cava is dilated in size with greater than 50% respiratory variability, suggesting right atrial pressure of 8 mmHg. IAS/Shunts: There is left bowing of the interatrial septum, suggestive of elevated right atrial pressure. The atrial septum is grossly normal.  LEFT VENTRICLE PLAX 2D LVIDd:         4.70 cm   Diastology LVIDs:         3.00 cm   LV e' medial:    9.08 cm/s LV PW:         0.90 cm   LV E/e' medial:  6.8 LV IVS:        0.90 cm   LV e' lateral:   13.60 cm/s LVOT diam:     2.06 cm   LV E/e' lateral: 4.5 LV SV:         32 LV SV Index:   15 LVOT Area:     3.33 cm  RIGHT VENTRICLE            IVC RV S prime:     6.92 cm/s  IVC diam: 2.57 cm TAPSE (M-mode): 1.0 cm LEFT ATRIUM  Index        RIGHT ATRIUM           Index LA diam:        3.48 cm 1.69 cm/m   RA Area:     27.15 cm LA Vol (A2C):   37.1 ml 18.04 ml/m  RA Volume:   103.40 ml 50.28 ml/m LA Vol (A4C):   31.7 ml 15.42 ml/m LA Biplane Vol: 37.0 ml 17.99 ml/m  AORTIC VALVE                 PULMONIC VALVE AV Area (Vmax): 1.91 cm     PV Vmax:       0.91 m/s AV Vmax:        100.50 cm/s  PV Peak grad:  3.3 mmHg AV Peak Grad:   4.0 mmHg LVOT Vmax:      57.70 cm/s LVOT Vmean:     37.100 cm/s LVOT VTI:       0.095 m  AORTA Ao Root diam: 3.06 cm Ao Asc diam:  3.32 cm MITRAL VALVE               TRICUSPID VALVE MV Area (PHT): 6.37 cm    TR Peak grad:   74.6 mmHg MV Decel Time: 119 msec    TR Vmax:        432.00 cm/s MV E velocity: 61.80 cm/s MV A velocity: 98.50 cm/s  SHUNTS MV E/A  ratio:  0.63        Systemic VTI:  0.09 m                            Systemic Diam: 2.06 cm Sunit Tolia Electronically signed by [employer] Signature Date/Time: 04/29/2024/12:25:50 PM    Final     Medications:   Current Medications:  aspirin  EC  81 mg Oral Daily   atorvastatin   10 mg Oral Daily   buPROPion   150 mg Oral BID WC   enoxaparin  (LOVENOX ) injection  40 mg Subcutaneous Daily   furosemide   80 mg Intravenous Q12H   gabapentin   300 mg Oral BID   insulin  aspart  0-9 Units Subcutaneous TID WC   metolazone   5 mg Oral Once   pantoprazole   40 mg Oral BID    Infusions:  Patient Profile   Deborah Coffey is a 69 y.o. female with Pulmonary HTN with RV failure/cor pulmonale, admitted w/ a/c RV failure.   Assessment/Plan   1. 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 Annella  - ANA, ANCA, [place]-70, RF all negative. - Sleep study with mild OSA.  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 She was started on Sildenafil  20 tid. She was later transitioned to Opsynvi  - Echo 10/25 EF 60-65%, mod reduced RV, RVSP 91.2 mmHg, severe TR - Repeated RHC 10/25 due to worsening symptoms. RA 9, PA 74/25 (  46), PCW 8, TD CO/CI 6.7/3.4 - Now admitted w/ progressive NYHA IIIb symptoms + volume overload, in setting of dietary indiscretion w/ sodium  - Diuresing w/ IV Lasix , c/w mod volume overload on exam. Continue IV Lasix  80 mg bid + 5 mg of metolazone  x 1  - off Opsynvi , not taking because she feels  like it is not helping. Does not want to restart - would like to start sotatercept now that she has stable housing. Will notify pharmacy team of new start  - off Farxiga  w/ recurrent UTIs   2. CAD - CABG 2005 LIMA-> LAD - Cath (04/16/21): mLAD 75% LIMA to LAD is widely patent. pRCA 50 - stable w/o angina  - Continue ASA/statin.  3. CKD IIIb - Most recent SCr baseline has been 1.5-2.0. - Scr 1.9 today  - monitor w/ diuresis - off SGLT2i given UTIs    4. OSA - Mild on sleep study. Not on CPAP   5. HTN - controlled on current regimen    6. Smoking - cessation advised - continue Wellbutrin     Length of Stay: 3  Brittainy Simmons, PA-C  04/30/2024, 11:24 AM  Advanced Heart Failure Team Pager [phone] (M-F; 7a - 5p)   Please visit [email or web address]: For overnight coverage please call cardiology fellow first. If fellow not available call Shock/ECMO MD on call.  For ECMO / Mechanical Support (Impella, IABP, LVAD) issues call Shock / ECMO MD on call.   Patient seen with PA, I formulated the plan and agree with the above note.   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  She was admitted with 2-3 weeks progressive dyspnea and swelling.    Echo this admission showed EF 55-60%, D-shaped septum, moderate RV dysfunction with severe RV enlargement, severe TR.    General: NAD Neck: JVP 12-14 cm, no thyromegaly or thyroid  nodule.  Lungs: Clear to auscultation bilaterally with normal respiratory effort. CV: Nondisplaced PMI.  Heart regular S1/S2, no S3/S4, 2/6 HSM LLSB.  1+ edema to thighs bilaterally.  No carotid bruit.  Normal pedal pulses.  Abdomen: Soft, nontender, no hepatosplenomegaly, no distention.  Skin: Intact without lesions or rashes.  Neurologic: Alert and oriented x 3.  Psych: Normal affect. Extremities: No clubbing or cyanosis.  HEENT: Normal.   Volume overload/RV failure.  Creatinine stable  today at 1.94.   - Lasix  80 mg IV bid + metolazone  5 mg po x 1 today.   Possible group 1 component of PH (also group 3 with COPD).  She stopped Opsumit /tadalafil  as she felt like it did not help.  Plan to start sotatercept as outpatient after diuresis.    She still smokes 1/3 ppd, needs to quit.   Ezra Shuck 04/30/2024 1:12 PM       [1]  Allergies Allergen Reactions   Fish Allergy Other (See Comments)    Cause gout   Porcine (Pork) Protein-Containing Drug Products Other (See Comments)    Causes gout   Ciprofloxacin Rash   "

## 2024-04-30 NOTE — Progress Notes (Signed)
 Mobility Specialist Progress Note;   04/30/24 1417  Mobility  Activity Refused and notified nurse if applicable  Mobility Specialist Start Time (ACUTE ONLY) 1417  Mobility Specialist Stop Time (ACUTE ONLY) 1422  Mobility Specialist Time Calculation (min) (ACUTE ONLY) 5 min   Pt politely declining mobility at this time d/ extreme fatigue and pain in her feet. Declined any OOB mobility. Pt left with all needs met. Will f/u as able.   Lauraine Erm Mobility Specialist Please contact via SecureChat or [employer] [phone]

## 2024-05-01 ENCOUNTER — Encounter (HOSPITAL_COMMUNITY)

## 2024-05-01 ENCOUNTER — Ambulatory Visit (HOSPITAL_COMMUNITY)

## 2024-05-01 DIAGNOSIS — I2510 Atherosclerotic heart disease of native coronary artery without angina pectoris: Secondary | ICD-10-CM | POA: Diagnosis not present

## 2024-05-01 DIAGNOSIS — Z8673 Personal history of transient ischemic attack (TIA), and cerebral infarction without residual deficits: Secondary | ICD-10-CM | POA: Diagnosis not present

## 2024-05-01 DIAGNOSIS — E66812 Obesity, class 2: Secondary | ICD-10-CM | POA: Diagnosis not present

## 2024-05-01 DIAGNOSIS — F32A Depression, unspecified: Secondary | ICD-10-CM | POA: Diagnosis not present

## 2024-05-01 DIAGNOSIS — I5033 Acute on chronic diastolic (congestive) heart failure: Secondary | ICD-10-CM | POA: Diagnosis not present

## 2024-05-01 DIAGNOSIS — N1832 Chronic kidney disease, stage 3b: Secondary | ICD-10-CM | POA: Diagnosis not present

## 2024-05-01 DIAGNOSIS — E785 Hyperlipidemia, unspecified: Secondary | ICD-10-CM | POA: Diagnosis not present

## 2024-05-01 DIAGNOSIS — E1169 Type 2 diabetes mellitus with other specified complication: Secondary | ICD-10-CM | POA: Diagnosis not present

## 2024-05-01 DIAGNOSIS — I10 Essential (primary) hypertension: Secondary | ICD-10-CM | POA: Diagnosis not present

## 2024-05-01 LAB — BASIC METABOLIC PANEL WITH GFR
Anion gap: 11 (ref 5–15)
BUN: 46 mg/dL — ABNORMAL HIGH (ref 8–23)
CO2: 28 mmol/L (ref 22–32)
Calcium: 9.3 mg/dL (ref 8.9–10.3)
Chloride: 96 mmol/L — ABNORMAL LOW (ref 98–111)
Creatinine, Ser: 1.78 mg/dL — ABNORMAL HIGH (ref 0.44–1.00)
GFR, Estimated: 31 mL/min — ABNORMAL LOW
Glucose, Bld: 193 mg/dL — ABNORMAL HIGH (ref 70–99)
Potassium: 4.6 mmol/L (ref 3.5–5.1)
Sodium: 135 mmol/L (ref 135–145)

## 2024-05-01 LAB — MAGNESIUM: Magnesium: 2 mg/dL (ref 1.7–2.4)

## 2024-05-01 LAB — GLUCOSE, CAPILLARY
Glucose-Capillary: 172 mg/dL — ABNORMAL HIGH (ref 70–99)
Glucose-Capillary: 249 mg/dL — ABNORMAL HIGH (ref 70–99)
Glucose-Capillary: 334 mg/dL — ABNORMAL HIGH (ref 70–99)
Glucose-Capillary: 309 mg/dL — ABNORMAL HIGH (ref 70–99)

## 2024-05-01 MED ORDER — DIGOXIN 125 MCG PO TABS
0.1250 mg | ORAL_TABLET | Freq: Every day | ORAL | Status: DC
  Administered 2024-05-01 – 2024-05-03 (×3): 0.125 mg via ORAL
  Filled 2024-05-01 (×3): qty 1

## 2024-05-01 MED ORDER — METOLAZONE 5 MG PO TABS
5.0000 mg | ORAL_TABLET | Freq: Once | ORAL | Status: AC
  Administered 2024-05-01: 5 mg via ORAL
  Filled 2024-05-01: qty 1

## 2024-05-01 NOTE — Progress Notes (Signed)
 " PROGRESS NOTE    Deborah Coffey  FMW:[id number] DOB: 18-Oct-1955 DOA: 04/27/2024 PCP: Newlin, Enobong, MD (Confirm with patient/family/NH records and if not entered, this HAS to be entered at [facility] point of entry. No PCP if truly none.)   Chief Complaint  Patient presents with   Chest Pain    Brief Narrative: (Start on day 1 of progress note - keep it brief and live) Deborah Coffey is a 69 y.o. female with CAD s/p CABG x1 (LIMA-LAD) 2005, tobacco use, chronic diastolic CHF, anemia, depression, HLD, HTN, DM, CKD stage 3b, prior TIAs, pulmonary HTN (felt due to underlying lung disease/smoking), and morbid obesity.   Pt noticed increasing edema and sob on exertion for two weeks leading up to coming to hospital. She attempted using extra lasix  at home but that did not work and she came in. Upon arrival to ED, c/o sob, chest pain And difficulty ambulating.  Troponins elevated due to heart failure, otherwise EKG sinus rhythm and no evidence of acute coronary syndrome.   Assessment & Plan:    Acute on chronic diastolic CHF (congestive heart failure) (HCC) Echo 04/29/24 with preserved LV systolic function with EF 55 to 60%, interventricular septum is flattened in systole and diastole, RV systolic function with moderate reduction, RV with moderate to severe cavity enlargement, RVSP 82.6 mmHg, RA with severe enlargement, severe tricuspid regurgitation, moderate pulmonic regurgitation.   Less edema noted today. Output of 2.2 L overnight, received more furosemide  and metalozone this morning Monitor I/o and BP Restarted home digoxin  dose, continue furosemide  and reassess in am for metalozone   -CAD (coronary artery disease) - CABG x 1 (LIMA-LAD) High sensitive troponin due to HF and fluid overload, Continue atorvastatin /ASA    Essential hypertension Continue monitoring BP, stable at 100/55 Continue monitoring after furosemide   Chronic kidney disease, stage 3b (HCC) Scr increasing at  1.78, Na 135, K at 4.6, BiCarb at 28 Continue monitoring w/diuresis, electrolytes in am  Anemia of chronic disease- HgB at 8.2    Type 2 diabetes mellitus with hyperlipidemia (HCC) Continue monitoring BGL, and use SSI    Depression Continue taking Bupropion     Obesity, class 2 Bmi calculated at 38.6   Hyperlipidemia Continue atorvastatin         DVT prophylaxis: Lovenox  Code Status: Full Family Communication: No family present at bedside  Disposition:   Status is: Inpatient Remains inpatient appropriate because: Illness severity   Consultants:  Cardiology     Subjective: Pt is found Axox4, well-mannered, and stating she has been feeling much better and excited to finally be able to take full breaths without difficulty. She denies chest pain, sob, abdominal pain or discomfort, n/v/d, dizziness or other changes. She states she feels her leg swelling has gone down tremendously and thinks the bandage wrapping has helped tremendously. Pt is not in any distress.   Objective: Vitals:   04/30/24 2325 05/01/24 0513 05/01/24 0805 05/01/24 1101  BP: (!) 107/57 118/70 113/76   Pulse: (!) 104 (!) 103 100 90  Resp: 18 16 15    Temp: 97.6 F (36.4 C) 98 F (36.7 C) 98 F (36.7 C)   TempSrc: Oral Oral Oral   SpO2:   98%   Weight:      Height:        Intake/Output Summary (Last 24 hours) at 05/01/2024 1129 Last data filed at 05/01/2024 0500 Gross per 24 hour  Intake --  Output 2200 ml  Net -2200 ml   [employer]  04/28/24 1320  Weight: 102 kg    Examination:  General exam: Appears calm and comfortable  Respiratory system: Clear to auscultation bilaterally, but does have mild rales in lower left lobe. SABRA Respiratory effort normal. Cardiovascular system: S1 & S2 heard, RRR. Mild JVD, murmurs, rubs, gallops or clicks. Mild pedal edema. Gastrointestinal system: Abdomen is nondistended, soft and nontender.  Central nervous system: Alert and oriented. No focal  neurological deficits. Extremities: Symmetric 5 x 5 power, mild lower extremity swelling Skin: No rashes, lesions or ulcers     Data Reviewed: I have personally reviewed following labs and imaging studies  CBC: Recent Labs  Lab 04/27/24 1935 04/28/24 0241  WBC 6.4 6.5  HGB 8.5* 8.2*  HCT 30.6* 29.2*  MCV 75.4* 74.9*  PLT 307 314    Basic Metabolic Panel: Recent Labs  Lab 04/27/24 1935 04/28/24 0241 04/29/24 0501 04/30/24 0453 05/01/24 0432  NA 137 139 139 137 135  K 3.9 3.5 4.1 3.9 4.6  CL 101 103 100 99 96*  CO2 26 26 29 29 28   GLUCOSE 125* 92 190* 152* 193*  BUN 31* 28* 34* 39* 46*  CREATININE 1.53* 1.47* 1.94* 1.94* 1.78*  CALCIUM  9.4 9.2 9.0 9.4 9.3  MG 1.9  --  1.9 2.2 2.0    GFR: Estimated Creatinine Clearance: 35.6 mL/min (A) (by C-G formula based on SCr of 1.78 mg/dL (H)).  Liver Function Tests: No results for input(s): AST, ALT, ALKPHOS, BILITOT, PROT, ALBUMIN in the last 168 hours.  CBG: Recent Labs  Lab 04/30/24 0751 04/30/24 1200 04/30/24 1559 04/30/24 2123 05/01/24 0803  GLUCAP 196* 191* 232* 213* 172*     No results found for this or any previous visit (from the past 240 hours).       Radiology Studies: ECHOCARDIOGRAM COMPLETE Result Date: 04/29/2024    ECHOCARDIOGRAM REPORT   Patient Name:   Deborah Coffey Date of Exam: 04/29/2024 Medical Rec #:  [id number]          Height:       64.0 in Accession #:    [id number]         Weight:       224.9 lb Date of Birth:  1955/05/07         BSA:          2.056 m Patient Age:    68 years           BP:           144/69 mmHg Patient Gender: F                  HR:           89 bpm. Exam Location:  Inpatient Procedure: 2D Echo, Cardiac Doppler, Color Doppler and Intracardiac            Opacification Agent (Both Spectral and Color Flow Doppler were            utilized during procedure). Indications:    Dyspnea R06.00  History:        Patient has prior history of Echocardiogram  examinations, most                 recent 01/30/2024. CHF, CAD, Prior CABG, Pulmonary HTN, CKD,                 stage 3 and TIA, Signs/Symptoms:Hypotension and Dyspnea; Risk  Factors:Hypertension, Diabetes, Sleep Apnea and Current Smoker.  Sonographer:    [name] RCS Referring Phys: [id number] MAURICIO DANIEL ARRIEN IMPRESSIONS  1. Left ventricular ejection fraction, by estimation, is 55 to 60%. The left ventricle has normal function. The left ventricle has no regional wall motion abnormalities. Left ventricular diastolic parameters were normal. There is the interventricular septum is flattened in systole and diastole, consistent with right ventricular pressure and volume overload.  2. Right ventricular systolic function is moderately reduced. The right ventricular size is moderate to severely enlarged. There is severely elevated pulmonary artery systolic pressure. The estimated right ventricular systolic pressure is 82.6 mmHg.  3. Right atrial size was severely dilated.  4. The mitral valve is normal in structure. No evidence of mitral valve regurgitation. No evidence of mitral stenosis.  5. Tricuspid valve regurgitation is severe.  6. The aortic valve is normal in structure. Aortic valve regurgitation is not visualized. No aortic stenosis is present.  7. Pulmonic valve regurgitation is moderate.  8. The inferior vena cava is dilated in size with >50% respiratory variability, suggesting right atrial pressure of 8 mmHg. Comparison(s): A prior study was performed on 01/30/2024. LVEF 60-65%, RV pressure and volume overload, mild RAE, severe TR, estimated RAP 8 mmHG. FINDINGS  Left Ventricle: Left ventricular ejection fraction, by estimation, is 55 to 60%. The left ventricle has normal function. The left ventricle has no regional wall motion abnormalities. Definity  contrast agent was given IV to delineate the left ventricular  endocardial borders. The left ventricular internal cavity size was  normal in size. There is no left ventricular hypertrophy. The interventricular septum is flattened in systole and diastole, consistent with right ventricular pressure and volume overload. Left ventricular diastolic parameters were normal. Right Ventricle: The right ventricular size is moderate to severely enlarged. Right vetricular wall thickness was not well visualized. Right ventricular systolic function is moderately reduced. There is severely elevated pulmonary artery systolic pressure. The tricuspid regurgitant velocity is 4.32 m/s, and with an assumed right atrial pressure of 8 mmHg, the estimated right ventricular systolic pressure is 82.6 mmHg. Left Atrium: Left atrial size was normal in size. Right Atrium: Right atrial size was severely dilated. Pericardium: There is no evidence of pericardial effusion. Mitral Valve: The mitral valve is normal in structure. No evidence of mitral valve regurgitation. No evidence of mitral valve stenosis. Tricuspid Valve: The tricuspid valve is grossly normal. Tricuspid valve regurgitation is severe. No evidence of tricuspid stenosis. Aortic Valve: The aortic valve is normal in structure. Aortic valve regurgitation is not visualized. No aortic stenosis is present. Aortic valve peak gradient measures 4.0 mmHg. Pulmonic Valve: The pulmonic valve was grossly normal. Pulmonic valve regurgitation is moderate. No evidence of pulmonic stenosis. Aorta: The aortic root and ascending aorta are structurally normal, with no evidence of dilitation. Venous: The inferior vena cava is dilated in size with greater than 50% respiratory variability, suggesting right atrial pressure of 8 mmHg. IAS/Shunts: There is left bowing of the interatrial septum, suggestive of elevated right atrial pressure. The atrial septum is grossly normal.  LEFT VENTRICLE PLAX 2D LVIDd:         4.70 cm   Diastology LVIDs:         3.00 cm   LV e' medial:    9.08 cm/s LV PW:         0.90 cm   LV E/e' medial:  6.8 LV  IVS:        0.90 cm   LV e'  lateral:   13.60 cm/s LVOT diam:     2.06 cm   LV E/e' lateral: 4.5 LV SV:         32 LV SV Index:   15 LVOT Area:     3.33 cm  RIGHT VENTRICLE            IVC RV S prime:     6.92 cm/s  IVC diam: 2.57 cm TAPSE (M-mode): 1.0 cm LEFT ATRIUM             Index        RIGHT ATRIUM           Index LA diam:        3.48 cm 1.69 cm/m   RA Area:     27.15 cm LA Vol (A2C):   37.1 ml 18.04 ml/m  RA Volume:   103.40 ml 50.28 ml/m LA Vol (A4C):   31.7 ml 15.42 ml/m LA Biplane Vol: 37.0 ml 17.99 ml/m  AORTIC VALVE                 PULMONIC VALVE AV Area (Vmax): 1.91 cm     PV Vmax:       0.91 m/s AV Vmax:        100.50 cm/s  PV Peak grad:  3.3 mmHg AV Peak Grad:   4.0 mmHg LVOT Vmax:      57.70 cm/s LVOT Vmean:     37.100 cm/s LVOT VTI:       0.095 m  AORTA Ao Root diam: 3.06 cm Ao Asc diam:  3.32 cm MITRAL VALVE               TRICUSPID VALVE MV Area (PHT): 6.37 cm    TR Peak grad:   74.6 mmHg MV Decel Time: 119 msec    TR Vmax:        432.00 cm/s MV E velocity: 61.80 cm/s MV A velocity: 98.50 cm/s  SHUNTS MV E/A ratio:  0.63        Systemic VTI:  0.09 m                            Systemic Diam: 2.06 cm Sunit Tolia Electronically signed by [employer] Signature Date/Time: 04/29/2024/12:25:50 PM    Final         Scheduled Meds:  aspirin  EC  81 mg Oral Daily   atorvastatin   10 mg Oral Daily   buPROPion   150 mg Oral BID WC   digoxin   0.125 mg Oral Daily   enoxaparin  (LOVENOX ) injection  40 mg Subcutaneous Daily   furosemide   80 mg Intravenous Q12H   gabapentin   300 mg Oral BID   insulin  aspart  0-9 Units Subcutaneous TID WC   pantoprazole   40 mg Oral BID     LOS: 4 days    Time spent: 20 minutes    Veer Elamin [employer], PA-S [facility]   To contact the attending provider between 7A-7P or the covering provider during after hours 7P-7A, please log into the web site [email or web address] and access using universal [place] password for that web site. If you do not have  the password, please call the hospital operator.  05/01/2024, 11:29 AM   "

## 2024-05-01 NOTE — Plan of Care (Signed)
" °  Problem: Education: Goal: Knowledge of General Education information will improve Description: Including pain rating scale, medication(s)/side effects and non-pharmacologic comfort measures Outcome: Progressing   Problem: Health Behavior/Discharge Planning: Goal: Ability to manage health-related needs will improve Outcome: Progressing   Problem: Clinical Measurements: Goal: Ability to maintain clinical measurements within normal limits will improve Outcome: Progressing   Problem: Clinical Measurements: Goal: Will remain free from infection Outcome: Progressing   Problem: Clinical Measurements: Goal: Diagnostic test results will improve Outcome: Progressing   Problem: Activity: Goal: Risk for activity intolerance will decrease Outcome: Progressing   Problem: Pain Managment: Goal: General experience of comfort will improve and/or be controlled Outcome: Progressing   Problem: Safety: Goal: Ability to remain free from injury will improve Outcome: Progressing   Problem: Coping: Goal: Ability to adjust to condition or change in health will improve Outcome: Progressing   Problem: Health Behavior/Discharge Planning: Goal: Ability to manage health-related needs will improve Outcome: Progressing   Problem: Metabolic: Goal: Ability to maintain appropriate glucose levels will improve Outcome: Progressing   Problem: Nutritional: Goal: Progress toward achieving an optimal weight will improve Outcome: Progressing   Problem: Skin Integrity: Goal: Risk for impaired skin integrity will decrease Outcome: Progressing   "

## 2024-05-01 NOTE — Inpatient Diabetes Management (Signed)
 Inpatient Diabetes Program Recommendations  AACE/ADA: New Consensus Statement on Inpatient Glycemic Control (2015)  Target Ranges:  Prepandial:   less than 140 mg/dL      Peak postprandial:   less than 180 mg/dL (1-2 hours)      Critically ill patients:  140 - 180 mg/dL   Lab Results  Component Value Date   GLUCAP 249 (H) 05/01/2024   HGBA1C 6.1 (H) 12/11/2023    Latest Reference Range & Units 04/30/24 12:00 04/30/24 15:59 04/30/24 21:23 05/01/24 08:03 05/01/24 11:32  Glucose-Capillary 70 - 99 mg/dL 808 (H) 767 (H) 786 (H) 172 (H) 249 (H)    Review of Glycemic Control  Diabetes history: DM2  Outpatient Diabetes medications:  Dexcom CGM  Toujeo  48 units daily  Metformin  1,000mg  BID   Current orders for Inpatient glycemic control:  Novolog  0-9 units TID  Inpatient Diabetes Program Recommendations:   Noted:  - patient takes Toujeo  48 units daily at home - Hypoglycemia on 04/30/23  Please consider starting [place] 15 units daily   Thanks,  Lavanda Search, RN, MSN, [facility]  Inpatient Diabetes Coordinator  Pager [phone] (8a-5p)

## 2024-05-01 NOTE — Progress Notes (Addendum)
 "    Advanced Heart Failure Rounding Note  Cardiologist: Toribio Fuel, MD  Chief Complaint: A/C RV failure and pulmonary HTN Patient Profile   Deborah Coffey is a 69 y.o. female with CAD s/p CABG x1 (LIMA-LAD) 2005, tobacco use, chronic diastolic CHF, anemia, depression, HLD, HTN, DM, CKD stage 3b, prior TIAs, pulmonary HTN (felt due to underlying lung disease/smoking), and morbid obesity.   Significant events:   04/27/24: Admitted with A/C RV failure with volume overload in the setting of dietary indiscretion.   Subjective:    -2.2L UOP with IV lasix  yesterday. No daily weights ordered, will order. Last weight from 1/10.   Feels much better today. Breathing much improved. Does feel a little weaker however.   Objective:   Weight Range: 102 kg Body mass index is 38.6 kg/m.   Vital Signs:   Temp:  [97.4 F (36.3 C)-98.2 F (36.8 C)] 98 F (36.7 C) (01/13 0805) Pulse Rate:  [89-104] 100 (01/13 0805) Resp:  [15-20] 15 (01/13 0805) BP: (107-125)/(57-88) 113/76 (01/13 0805) SpO2:  [98 %-100 %] 98 % (01/13 0805) Last BM Date :  (PTA)  Weight change: Filed Weights   04/28/24 1320  Weight: 102 kg    Intake/Output:   Intake/Output Summary (Last 24 hours) at 05/01/2024 0900 Last data filed at 05/01/2024 0500 Gross per 24 hour  Intake --  Output 2200 ml  Net -2200 ml     Physical Exam   General:  elderly appearing.   Cor: Tachy/regular rate & rhythm. No murmurs. JVD ~8/9 cm (sitting straight up on EOB).  Lungs: clear, diminished bases Extremities: +1-2 BLE edema. + UNNA boots  Telemetry   ST 110 with intt PVCs (Personally reviewed)    Labs   CBC No results for input(s): WBC, NEUTROABS, HGB, HCT, MCV, PLT in the last 72 hours. Basic Metabolic Panel Recent Labs    98/87/73 0453 05/01/24 0432  NA 137 135  K 3.9 4.6  CL 99 96*  CO2 29 28  GLUCOSE 152* 193*  BUN 39* 46*  CREATININE 1.94* 1.78*  CALCIUM  9.4 9.3  MG 2.2 2.0   Liver  Function Tests No results for input(s): AST, ALT, ALKPHOS, BILITOT, PROT, ALBUMIN in the last 72 hours. No results for input(s): LIPASE, AMYLASE in the last 72 hours. Cardiac Enzymes No results for input(s): CKTOTAL, CKMB, CKMBINDEX, TROPONINI in the last 72 hours.  BNP: BNP (last 3 results) Recent Labs    12/11/23 1313 12/22/23 1035 03/01/24 1538  BNP 673.3* 140.3* 1,434.1*    ProBNP (last 3 results) Recent Labs    04/27/24 1935  PROBNP 12,483.0*     D-Dimer No results for input(s): DDIMER in the last 72 hours. Hemoglobin A1C No results for input(s): HGBA1C in the last 72 hours. Fasting Lipid Panel No results for input(s): CHOL, HDL, LDLCALC, TRIG, CHOLHDL, LDLDIRECT in the last 72 hours. Medications:   Scheduled Medications:  aspirin  EC  81 mg Oral Daily   atorvastatin   10 mg Oral Daily   buPROPion   150 mg Oral BID WC   enoxaparin  (LOVENOX ) injection  40 mg Subcutaneous Daily   furosemide   80 mg Intravenous Q12H   gabapentin   300 mg Oral BID   insulin  aspart  0-9 Units Subcutaneous TID WC   pantoprazole   40 mg Oral BID    Infusions:   PRN Medications: acetaminophen  **OR** acetaminophen , ondansetron  **OR** ondansetron  (ZOFRAN ) IV  Assessment/Plan   1. 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 Annella  - ANA, ANCA, [place]-70, RF all negative. - Sleep study with mild OSA.  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 She was  started on Sildenafil  20 tid. She was later transitioned to Opsynvi  - Echo 10/25 EF 60-65%, mod reduced RV, RVSP 91.2 mmHg, severe TR - Repeated RHC 10/25 due to worsening symptoms. RA 9, PA 74/25 (46), PCW 8, TD CO/CI 6.7/3.4 - Now admitted w/ progressive NYHA IIIb symptoms + volume overload, in setting of dietary indiscretion w/ sodium  - Diuresed well with IV lasix  + metolazone  yesterday. Repeat today.   - Restart home digoxin  0.125 mg daily.  - off Opsynvi , not taking because she feels like it is not helping. Does not want to restart - would like to start sotatercept now that she has stable housing. Will notify pharmacy team of new start, plan for OP initiation - off Farxiga  w/ recurrent UTIs    2. CAD - CABG 2005 LIMA-> LAD - Cath (04/16/21): mLAD 75% LIMA to LAD is widely patent. pRCA 50 - stable w/o angina  - Continue ASA/statin.   3. CKD IIIb - Most recent SCr baseline has been 1.5-2.0. - Scr 1.78 today, improved with diuresis.  - monitor w/ diuresis - off SGLT2i given UTIs    4. OSA - Mild on sleep study. Not on CPAP   5. HTN - controlled on current regimen    6. Smoking - cessation advised - continue Wellbutrin    7. Concern for recurrent UTI - Patient reports foul smelling urine - Cloudy appearance noted in canister - Will check UA/UC   Length of Stay: 4  Beckey LITTIE Coe, NP  05/01/2024, 9:00 AM  Advanced Heart Failure Team Pager [phone] (M-F; 7a - 5p)   Please visit [email or web address]: For overnight coverage please call cardiology fellow first. If fellow not available call Shock/ECMO MD on call.  For ECMO / Mechanical Support (Impella, IABP, LVAD) issues call Shock / ECMO MD on call.  5  Patient seen with NP, I formulated the plan and agree with the above note.   She seems to have diuresed well yesterday, unfortunately she has not been getting weights.  Creatinine lower at 1.78.    General: NAD Neck: JVP 12-14 cm, no thyromegaly or thyroid  nodule.  Lungs: Clear  to auscultation bilaterally with normal respiratory effort. CV: Nondisplaced PMI.  Heart regular S1/S2, no S3/S4, no murmur.  1+ edema to knees.  Abdomen: Soft, nontender, no hepatosplenomegaly, no distention.  Skin: Intact without lesions or rashes.  Neurologic: Alert and oriented x 3.  Psych: Normal affect. Extremities: No clubbing or cyanosis.  HEENT: Normal.   Volume overload/RV failure.  Creatinine 1.94 => 1.78.  She diuresed well yesterday with IV Lasix  and metolazone  but still looks volume overloaded on exam.  - Lasix  80 mg IV bid + metolazone  5 mg po x 1 again today.  - Would aim for RHC prior to discharge  to make sure filling pressures are optimized prior to starting sotatercept.    Possible group 1 component of PH (also group 3 with COPD).  She stopped Opsumit /tadalafil  as she felt like it did not help.  Plan to start sotatercept as outpatient after diuresis.     She still smokes 1/3 ppd, needs to quit.   Ezra Shuck 05/01/2024 12:41 PM  "

## 2024-05-01 NOTE — Progress Notes (Signed)
 Physical Therapy Treatment Patient Details Name: Deborah Coffey MRN: [id number] DOB: 1955/12/25 Today's Date: 05/01/2024   History of Present Illness 69 y.o. female presents to [facility] 04/27/24 with heart failure exacerbation. Chest x-ray showed cardiomegaly, mild hilar vascular congestion, and small L pleural effusion.  PMHx: CAD s/p CABG, CHF, HTN, HLD, depression, Tobacco use , T2DM, CKD, TIA, cva, pulmonary HTN, OSA    PT Comments  Pt reports fatigue today and a desire to get cleaned up before ambulating. Assisted pt with seated and standing self care activities, working on unilateral support balance as well. Pt tolerated increased ambulation distance with rollator, 2 bouts of 100' with seated rest break in between. Pt has difficulty rising from low surfaces, esp if surface is soft. Did better standing from rollator. Discussed this in relation to seating choices at home. Pt tolerated standing postural exercises before finishing in chair. Gave pt some posterior chain exercises to add into her current HEP. VSS. PT will continue to follow.     If plan is discharge home, recommend the following: Assist for transportation;Help with stairs or ramp for entrance   Can travel by private vehicle        Equipment Recommendations  None recommended by PT    Recommendations for Other Services       Precautions / Restrictions Precautions Precautions: Fall Recall of Precautions/Restrictions: Intact Restrictions Weight Bearing Restrictions Per Provider Order: No     Mobility  Bed Mobility               General bed mobility comments: pt sitting EOB, got to that position independently    Transfers Overall transfer level: Needs assistance Equipment used: Rolling walker (2 wheels) Transfers: Sit to/from Stand Sit to Stand: Min assist, Mod assist           General transfer comment: let pt attempt sit>stand from bed in lowest position, 3x but pt unable to achieve standing. Needed mod  A to stand from this position. With bed 2 higher was able to stand with min A. From higer and firmer surface of rollator was able to stand with CGA. Discussed this in relation to seating choices at home    Ambulation/Gait Ambulation/Gait assistance: Contact guard assist Gait Distance (Feet): 100 Feet (2x) Assistive device: Rollator (4 wheels) Gait Pattern/deviations: Step-through pattern, Decreased stance time - left, Trunk flexed Gait velocity: decr Gait velocity interpretation: <1.8 ft/sec, indicate of risk for recurrent falls Pre-gait activities: static standing finding COG General Gait Details: trunk flexed with gait and pt with minimal ability to straighten. Pt reports that LE's feel very heavy today and feet feel weird but could not give further description. Seated rest break taken on rollator x3 mins   Stairs             Wheelchair Mobility     Tilt Bed    Modified Rankin (Stroke Patients Only)       Balance Overall balance assessment: Needs assistance Sitting-balance support: Feet supported, No upper extremity supported Sitting balance-Leahy Scale: Good Sitting balance - Comments: pt able to reach to feet to don socks   Standing balance support: During functional activity, Reliant on assistive device for balance, Single extremity supported Standing balance-Leahy Scale: Poor Standing balance comment: pt able to perform perineal care with once hand in standing but needed min A for safety  Communication Communication Communication: No apparent difficulties  Cognition Arousal: Alert Behavior During Therapy: WFL for tasks assessed/performed   PT - Cognitive impairments: No apparent impairments                         Following commands: Intact      Cueing Cueing Techniques: Verbal cues  Exercises Other Exercises Other Exercises: standing postural correction exercises Other Exercises: standing balance  exercise with unilateral support Other Exercises: pt works quads and hip flexors with HEP, regularly through day. Discussed adding in supine hip bridges to better work posterior chain.    General Comments General comments (skin integrity, edema, etc.): SPO2 100% on RA, HR stable with ambulation, low 100's.      Pertinent Vitals/Pain Pain Assessment Pain Assessment: Faces Faces Pain Scale: Hurts even more Pain Location: bilateral feet Pain Descriptors / Indicators: Aching, Sore Pain Intervention(s): Limited activity within patient's tolerance, Monitored during session, Other (comment) (donned shoes for ambulation)    Home Living                          Prior Function            PT Goals (current goals can now be found in the care plan section) Acute Rehab PT Goals Patient Stated Goal: to go home PT Goal Formulation: With patient Time For Goal Achievement: 05/13/24 Potential to Achieve Goals: Good Progress towards PT goals: Progressing toward goals    Frequency    Min 1X/week      PT Plan      Co-evaluation              AM-PAC PT 6 Clicks Mobility   Outcome Measure  Help needed turning from your back to your side while in a flat bed without using bedrails?: None Help needed moving from lying on your back to sitting on the side of a flat bed without using bedrails?: A Little Help needed moving to and from a bed to a chair (including a wheelchair)?: A Little Help needed standing up from a chair using your arms (e.g., wheelchair or bedside chair)?: A Little Help needed to walk in hospital room?: A Little Help needed climbing 3-5 steps with a railing? : A Little 6 Click Score: 19    End of Session Equipment Utilized During Treatment: Gait belt Activity Tolerance: Patient tolerated treatment well Patient left: in chair;with call bell/phone within reach;with chair alarm set Nurse Communication: Mobility status PT Visit Diagnosis: Other  abnormalities of gait and mobility (R26.89);Muscle weakness (generalized) (M62.81)     Time: 8590-8549 PT Time Calculation (min) (ACUTE ONLY): 41 min  Charges:    $Gait Training: 8-22 mins $Therapeutic Exercise: 8-22 mins $Therapeutic Activity: 8-22 mins PT General Charges $$ ACUTE PT VISIT: 1 Visit                     Richerd Lipoma, PT  Acute Rehab Services Secure chat preferred Office [phone]    Richerd CROME Wendie Diskin 05/01/2024, 4:28 PM

## 2024-05-01 NOTE — Care Management Important Message (Signed)
 Important Message  Patient Details  Name: Deborah Coffey MRN: [id number] Date of Birth: [birth year]   Important Message Given:  Yes - Medicare IM     Vonzell Arrie Sharps 05/01/2024, 11:57 AM

## 2024-05-02 DIAGNOSIS — I10 Essential (primary) hypertension: Secondary | ICD-10-CM | POA: Diagnosis not present

## 2024-05-02 DIAGNOSIS — N1832 Chronic kidney disease, stage 3b: Secondary | ICD-10-CM | POA: Diagnosis not present

## 2024-05-02 DIAGNOSIS — I5033 Acute on chronic diastolic (congestive) heart failure: Secondary | ICD-10-CM | POA: Diagnosis not present

## 2024-05-02 LAB — BASIC METABOLIC PANEL WITH GFR
Anion gap: 11 (ref 5–15)
BUN: 52 mg/dL — ABNORMAL HIGH (ref 8–23)
CO2: 29 mmol/L (ref 22–32)
Calcium: 9.4 mg/dL (ref 8.9–10.3)
Chloride: 95 mmol/L — ABNORMAL LOW (ref 98–111)
Creatinine, Ser: 1.97 mg/dL — ABNORMAL HIGH (ref 0.44–1.00)
GFR, Estimated: 27 mL/min — ABNORMAL LOW
Glucose, Bld: 189 mg/dL — ABNORMAL HIGH (ref 70–99)
Potassium: 4.2 mmol/L (ref 3.5–5.1)
Sodium: 135 mmol/L (ref 135–145)

## 2024-05-02 LAB — URINALYSIS, W/ REFLEX TO CULTURE (INFECTION SUSPECTED)
Bacteria, UA: NONE SEEN
Bilirubin Urine: NEGATIVE
Glucose, UA: NEGATIVE mg/dL
Hgb urine dipstick: NEGATIVE
Ketones, ur: NEGATIVE mg/dL
Nitrite: NEGATIVE
Protein, ur: NEGATIVE mg/dL
Specific Gravity, Urine: 1.006 (ref 1.005–1.030)
pH: 7 (ref 5.0–8.0)

## 2024-05-02 LAB — GLUCOSE, CAPILLARY
Glucose-Capillary: 246 mg/dL — ABNORMAL HIGH (ref 70–99)
Glucose-Capillary: 294 mg/dL — ABNORMAL HIGH (ref 70–99)
Glucose-Capillary: 288 mg/dL — ABNORMAL HIGH (ref 70–99)
Glucose-Capillary: 327 mg/dL — ABNORMAL HIGH (ref 70–99)

## 2024-05-02 LAB — MAGNESIUM: Magnesium: 2 mg/dL (ref 1.7–2.4)

## 2024-05-02 MED ORDER — INSULIN ASPART 100 UNIT/ML IJ SOLN
2.0000 [IU] | Freq: Once | INTRAMUSCULAR | Status: AC
  Administered 2024-05-02: 2 [IU] via SUBCUTANEOUS
  Filled 2024-05-02: qty 2

## 2024-05-02 MED ORDER — ASPIRIN 81 MG PO CHEW
81.0000 mg | CHEWABLE_TABLET | ORAL | Status: AC
  Administered 2024-05-03: 81 mg via ORAL
  Filled 2024-05-02: qty 1

## 2024-05-02 MED ORDER — FUROSEMIDE 10 MG/ML IJ SOLN
80.0000 mg | Freq: Two times a day (BID) | INTRAMUSCULAR | Status: AC
  Administered 2024-05-02 (×2): 80 mg via INTRAVENOUS
  Filled 2024-05-02 (×2): qty 8

## 2024-05-02 MED ORDER — METOLAZONE 5 MG PO TABS
5.0000 mg | ORAL_TABLET | Freq: Once | ORAL | Status: AC
  Administered 2024-05-02: 5 mg via ORAL
  Filled 2024-05-02: qty 1

## 2024-05-02 MED ORDER — SODIUM CHLORIDE 0.9% FLUSH: 3.0000 mL | INTRAVENOUS | Status: DC | PRN

## 2024-05-02 MED ORDER — ASPIRIN 81 MG PO TBEC: 81.0000 mg | DELAYED_RELEASE_TABLET | Freq: Every day | ORAL | Status: DC

## 2024-05-02 MED ORDER — SODIUM CHLORIDE 0.9 % IV SOLN: 250.0000 mL | INTRAVENOUS | Status: DC | PRN

## 2024-05-02 MED ORDER — SODIUM CHLORIDE 0.9% FLUSH
3.0000 mL | Freq: Two times a day (BID) | INTRAVENOUS | Status: DC
  Administered 2024-05-02: 3 mL via INTRAVENOUS

## 2024-05-02 NOTE — Progress Notes (Signed)
" °  Mobility Specialist Progress Note;  05/02/24 1026  Mobility  Activity Ambulated with assistance  Level of Assistance Moderate assist, patient does 50-74%  Assistive Device Four wheel walker  Distance Ambulated (ft) 75 ft (x2)  Activity Response Tolerated well  Mobility Referral Yes  Mobility visit 1 Mobility  Mobility Specialist Start Time (ACUTE ONLY) 1026  Mobility Specialist Stop Time (ACUTE ONLY) 1047  Mobility Specialist Time Calculation (min) (ACUTE ONLY) 21 min    Pt received at EOB with NT in room, agreeable to mobility. ModA for STS, x3 attempts to stand. Once in hallway pt ambulated w/ only CGA. Pt took x1 seated rest break, VSS. Able to stand from rollator independently. Pt left at EOB, call bell and personal belongs in reach, bed alarm on. All needs met.  Florine Oak Mobility Specialist Please Neurosurgeon or [employer] [phone]   "

## 2024-05-02 NOTE — Progress Notes (Addendum)
 "    Advanced Heart Failure Rounding Note  Cardiologist: Toribio Fuel, MD  Chief Complaint: A/C RV failure and pulmonary HTN Patient Profile   Deborah Coffey is a 69 y.o. female with CAD s/p CABG x1 (LIMA-LAD) 2005, tobacco use, chronic diastolic CHF, anemia, depression, HLD, HTN, DM, CKD stage 3b, prior TIAs, pulmonary HTN (felt due to underlying lung disease/smoking), and morbid obesity.   Significant events:   04/27/24: Admitted with A/C RV failure with volume overload in the setting of dietary indiscretion.   Subjective:    1.2L in UOP out charted yesterday but I/Os incomplete d/t some incontinence. Wt down 6 lb. SCr back up 1.78>>1.97.  K 4.2   Feeling better. Breathing much improved. Sitting up on side of bed. No complaints.   Objective:   Weight Range: 96.3 kg Body mass index is 36.42 kg/m.   Vital Signs:   Temp:  [97.9 F (36.6 C)-98.3 F (36.8 C)] 97.9 F (36.6 C) (01/14 0824) Pulse Rate:  [89-109] 109 (01/14 0824) Resp:  [15-20] 20 (01/14 0824) BP: (102-127)/(46-74) 102/46 (01/14 0824) SpO2:  [94 %-99 %] 94 % (01/14 0824) Weight:  [96.3 kg-99 kg] 96.3 kg (01/14 0455) Last BM Date :  (PTA)  Weight change: Filed Weights   04/28/24 1320 05/01/24 1839 05/02/24 0455  Weight: 102 kg 99 kg 96.3 kg    Intake/Output:   Intake/Output Summary (Last 24 hours) at 05/02/2024 0855 Last data filed at 05/02/2024 0800 Gross per 24 hour  Intake 600 ml  Output 1200 ml  Net -600 ml     Physical Exam   General:  well appearing, NAD   Cor: Regular rhythm, tachy rate,  no MRG, JVD 8-9 cm  Lungs: clear but diminished at the bases bilaterally  Extremities: trace-1+ pretibial edema up to knees, + unna boots   Telemetry   Sinus tach low 100s (Personally reviewed)    Labs   CBC No results for input(s): WBC, NEUTROABS, HGB, HCT, MCV, PLT in the last 72 hours. Basic Metabolic Panel Recent Labs    98/86/73 0432 05/02/24 0450  NA 135 135  K 4.6 4.2   CL 96* 95*  CO2 28 29  GLUCOSE 193* 189*  BUN 46* 52*  CREATININE 1.78* 1.97*  CALCIUM  9.3 9.4  MG 2.0 2.0   Liver Function Tests No results for input(s): AST, ALT, ALKPHOS, BILITOT, PROT, ALBUMIN in the last 72 hours. No results for input(s): LIPASE, AMYLASE in the last 72 hours. Cardiac Enzymes No results for input(s): CKTOTAL, CKMB, CKMBINDEX, TROPONINI in the last 72 hours.  BNP: BNP (last 3 results) Recent Labs    12/11/23 1313 12/22/23 1035 03/01/24 1538  BNP 673.3* 140.3* 1,434.1*    ProBNP (last 3 results) Recent Labs    04/27/24 1935  PROBNP 12,483.0*     D-Dimer No results for input(s): DDIMER in the last 72 hours. Hemoglobin A1C No results for input(s): HGBA1C in the last 72 hours. Fasting Lipid Panel No results for input(s): CHOL, HDL, LDLCALC, TRIG, CHOLHDL, LDLDIRECT in the last 72 hours. Medications:   Scheduled Medications:  aspirin  EC  81 mg Oral Daily   atorvastatin   10 mg Oral Daily   buPROPion   150 mg Oral BID WC   digoxin   0.125 mg Oral Daily   enoxaparin  (LOVENOX ) injection  40 mg Subcutaneous Daily   furosemide   80 mg Intravenous Q12H   gabapentin   300 mg Oral BID   insulin  aspart  0-9 Units Subcutaneous TID WC  pantoprazole   40 mg Oral BID    Infusions:   PRN Medications: acetaminophen  **OR** acetaminophen , ondansetron  **OR** ondansetron  (ZOFRAN ) IV  Assessment/Plan   1. 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 Annella  - ANA, ANCA, Roberts-70, RF all negative. - Sleep study with mild OSA.  - Combination WHO GROUP I & III - Echo 11/24 EF 60-65% RV mod reduced RVSP 92  - RHC 3/25: RA 4, PA  78/19, PCW 6, FICK 3.9/1.9, TD 3.6/1.7 PVR 10, Ao sat 95%, PA sat 57%, PAPi 14.8 c/w severe PAH w/ cor pulmonale, suspect Group 1 and possibly Group 3. She was started on Sildenafil  20 tid. She was later transitioned to Opsynvi  - Echo 10/25 EF 60-65%, mod reduced RV, RVSP 91.2 mmHg, severe TR - Repeated RHC 10/25 due to worsening symptoms. RA 9, PA 74/25 (46), PCW 8, TD CO/CI 6.7/3.4 - Now admitted w/ progressive NYHA IIIb symptoms + volume overload, in setting of dietary indiscretion w/ sodium  - Diuresing well w/ Lasix  and metolazone , volume improving but still w/ mild LEE. Repeat dose of 80 IV Lasix  this morning and plan for RHC later today    - Continue digoxin  0.125 mg daily.  - off Opsynvi , not taking because she feels like it is not helping. Does not want to restart - would like to start sotatercept now that she has stable housing. Will notify pharmacy team of new start, plan for OP initiation - off Farxiga  w/ recurrent UTIs    2. CAD - CABG 2005 LIMA-> LAD - Cath (04/16/21): mLAD 75% LIMA to LAD is widely patent. pRCA 50 - stable w/o angina  - Continue ASA/statin.   3. CKD IIIb - Most recent SCr baseline has been 1.5-2.0. - SCr stable at 1.9 today   - monitor w/ diuresis, RHC today  - off SGLT2i given UTIs    4. OSA - Mild on sleep study. Not on CPAP   5. HTN - controlled on current regimen  - no change    6. Smoking - cessation advised - continue Wellbutrin    7. Concern for recurrent UTI - Patient reports foul smelling urine - Cloudy appearance noted in canister - Will check UA/UC  8. Type 2DM  - CBGs elevated  - continue SSI - primary team to adjust regimen    Length of Stay: [address], PA-C  05/02/2024, 8:55 AM  Advanced Heart Failure Team Pager [phone] (M-F; 7a - 5p)   Please visit [email or web address]: For overnight coverage please call cardiology fellow first. If fellow not available call Shock/ECMO MD on call.  For ECMO / Mechanical Support  (Impella, IABP, LVAD) issues call Shock / ECMO MD on call.  5  Patient seen with PA, I formulated the plan and agree with the above note.   Weight down by 6 lbs.  Creatinine 1.94 => 1.78 = 1.97.    Still feels like she has some fluid.   General: NAD Neck: JVP 12-14 cm, no thyromegaly or thyroid  nodule.  Lungs: Clear to auscultation bilaterally with normal respiratory effort. CV: Nondisplaced PMI.  Heart regular S1/S2, no S3/S4, no murmur.  1+ ankle edema.  Abdomen: Soft, nontender, no hepatosplenomegaly,  no distention.  Skin: Intact without lesions or rashes.  Neurologic: Alert and oriented x 3.  Psych: Normal affect. Extremities: No clubbing or cyanosis.  HEENT: Normal.   Exam somewhat difficult but I think she still has some fluid on board.  Creatinine has remained relatively stable.  I will give her Lasix  80 mg IV bid + metolazone  2.5 x 1 again today.  Plan for RHC tomorrow morning to reassess PA pressure and filling pressures.  We discussed risks/benefits and she agrees to procedure.   Concern for UTI, getting UA and culture.   Ezra Shuck 05/02/2024 11:23 AM  "

## 2024-05-02 NOTE — Progress Notes (Addendum)
 "  [facility] PROGRESS NOTE   Deborah Coffey FMW:[id number] DOB: 1955/07/21 DOA: 04/27/2024  PCP: [name], MD  Brief History:  69 y.o. female with CAD s/p CABG x1 (LIMA-LAD) 2005, tobacco use, chronic diastolic CHF, anemia, depression, HLD, HTN, DM, CKD stage 3b, prior TIAs, pulmonary HTN (felt due to underlying lung disease/smoking), and morbid obesity. Pt noticed increasing edema and sob on exertion for two weeks leading up to coming to hospital.  She was thought to be fluid overloaded and was hospitalized for further management.  Consultants: Cardiology  Procedures: Echocardiogram    Subjective/Interval History: Patient mentions that her shortness of breath has improved.  She is complaining of tremors this morning.  Denies any chest pain.  No nausea vomiting.    Assessment/Plan:  Acute diastolic CHF/acute RV failure/cor pulmonale/pulmonary hypertension Echocardiogram with preserved LV systolic function with EF 55 to 60%, interventricular septum is flattened in systole and diastole, RV systolic function with moderate reduction, RV with moderate to severe cavity enlargement, RVSP 82.6 mmHg, RA with severe enlargement, severe tricuspid regurgitation, moderate pulmonic regurgitation.  Cardiology is following.  Patient being diuresed.  Symptomatically she appears to have improved. Weight has decreased. Cardiology plans to continue with furosemide  in the plan for right heart catheterization tomorrow. Continue ins and outs and daily weights.   CAD (coronary artery disease) - CABG x 1 (LIMA-LAD) High sensitive troponin elevation due to heart failure and volume overload, no acute coronary syndrome.   Essential hypertension Continue to monitor blood pressure.   Chronic kidney disease, stage 3b (HCC) Baseline creatinine seems to be between 1.5 and 2.0.  Stable for the most part.  Monitor closely while she is getting diuresed.    Obesity Estimated body mass index is  36.42 kg/m as calculated from the following:   Height as of this encounter: 5' 4 (1.626 m).   Weight as of this encounter: 96.3 kg.   DVT Prophylaxis: Lovenox  Code Status: Full code Family Communication: Discussed with patient Disposition Plan: Home when improved    Medications: Scheduled:  aspirin  EC  81 mg Oral Daily   atorvastatin   10 mg Oral Daily   buPROPion   150 mg Oral BID WC   digoxin   0.125 mg Oral Daily   enoxaparin  (LOVENOX ) injection  40 mg Subcutaneous Daily   furosemide   80 mg Intravenous BID   gabapentin   300 mg Oral BID   insulin  aspart  0-9 Units Subcutaneous TID WC   pantoprazole   40 mg Oral BID   Continuous: PRN:acetaminophen  **OR** acetaminophen , ondansetron  **OR** ondansetron  (ZOFRAN ) IV  Antibiotics: Anti-infectives (From admission, onward)    None       Objective:  Vital Signs  Vitals:   05/01/24 2328 05/02/24 0455 05/02/24 0824 05/02/24 1123  BP: 127/74 110/73 (!) 102/46 120/77  Pulse: 90 (!) 102 (!) 109 94  Resp: 20 16 20 20   Temp: 98 F (36.7 C) 98.3 F (36.8 C) 97.9 F (36.6 C) 97.9 F (36.6 C)  TempSrc: Oral Oral Oral Oral  SpO2: 98% 99% 94% 93%  Weight:  96.3 kg    Height:        Intake/Output Summary (Last 24 hours) at 05/02/2024 1228 Last data filed at 05/02/2024 1100 Gross per 24 hour  Intake 837 ml  Output 1200 ml  Net -363 ml   Filed Weights   04/28/24 1320 05/01/24 1839 05/02/24 0455  Weight: 102 kg 99 kg 96.3 kg    General appearance: Awake alert.  In no distress Resp:  Normal effort at rest.  Diminished air entry at the bases.  No wheezing or rhonchi. Cardio: S1-S2 is normal regular.  No S3-S4.  No rubs murmurs or bruit GI: Abdomen is soft.  Nontender nondistended.  Bowel sounds are present normal.  No masses organomegaly Extremities: Covered with Unna boots Neurologic: Alert and oriented x3.  No focal neurological deficits.    Lab Results:  Data Reviewed: I have personally reviewed following labs and  reports of the imaging studies  CBC: Recent Labs  Lab 04/27/24 1935 04/28/24 0241  WBC 6.4 6.5  HGB 8.5* 8.2*  HCT 30.6* 29.2*  MCV 75.4* 74.9*  PLT 307 314    Basic Metabolic Panel: Recent Labs  Lab 04/27/24 1935 04/28/24 0241 04/29/24 0501 04/30/24 0453 05/01/24 0432 05/02/24 0450  NA 137 139 139 137 135 135  K 3.9 3.5 4.1 3.9 4.6 4.2  CL 101 103 100 99 96* 95*  CO2 26 26 29 29 28 29   GLUCOSE 125* 92 190* 152* 193* 189*  BUN 31* 28* 34* 39* 46* 52*  CREATININE 1.53* 1.47* 1.94* 1.94* 1.78* 1.97*  CALCIUM  9.4 9.2 9.0 9.4 9.3 9.4  MG 1.9  --  1.9 2.2 2.0 2.0    GFR: Estimated Creatinine Clearance: 31.2 mL/min (A) (by C-G formula based on SCr of 1.97 mg/dL (H)).  Coagulation Profile: Recent Labs  Lab 04/28/24 0241  INR 1.0   BNP (last 3 results) Recent Labs    04/27/24 1935  PROBNP 12,483.0*   CBG: Recent Labs  Lab 05/01/24 1132 05/01/24 1637 05/01/24 2059 05/02/24 0820 05/02/24 1126  GLUCAP 249* 334* 309* 246* 294*    Radiology Studies: No results found.     LOS: 5 days   Deborah Coffey [employer] on [email or web address]  05/02/2024, 12:28 PM   "

## 2024-05-02 NOTE — H&P (View-Only) (Signed)
 "    Advanced Heart Failure Rounding Note  Cardiologist: Toribio Fuel, MD  Chief Complaint: A/C RV failure and pulmonary HTN Patient Profile   Deborah Coffey is a 69 y.o. female with CAD s/p CABG x1 (LIMA-LAD) 2005, tobacco use, chronic diastolic CHF, anemia, depression, HLD, HTN, DM, CKD stage 3b, prior TIAs, pulmonary HTN (felt due to underlying lung disease/smoking), and morbid obesity.   Significant events:   04/27/24: Admitted with A/C RV failure with volume overload in the setting of dietary indiscretion.   Subjective:    1.2L in UOP out charted yesterday but I/Os incomplete d/t some incontinence. Wt down 6 lb. SCr back up 1.78>>1.97.  K 4.2   Feeling better. Breathing much improved. Sitting up on side of bed. No complaints.   Objective:   Weight Range: 96.3 kg Body mass index is 36.42 kg/m.   Vital Signs:   Temp:  [97.9 F (36.6 C)-98.3 F (36.8 C)] 97.9 F (36.6 C) (01/14 0824) Pulse Rate:  [89-109] 109 (01/14 0824) Resp:  [15-20] 20 (01/14 0824) BP: (102-127)/(46-74) 102/46 (01/14 0824) SpO2:  [94 %-99 %] 94 % (01/14 0824) Weight:  [96.3 kg-99 kg] 96.3 kg (01/14 0455) Last BM Date :  (PTA)  Weight change: Filed Weights   04/28/24 1320 05/01/24 1839 05/02/24 0455  Weight: 102 kg 99 kg 96.3 kg    Intake/Output:   Intake/Output Summary (Last 24 hours) at 05/02/2024 0855 Last data filed at 05/02/2024 0800 Gross per 24 hour  Intake 600 ml  Output 1200 ml  Net -600 ml     Physical Exam   General:  well appearing, NAD   Cor: Regular rhythm, tachy rate,  no MRG, JVD 8-9 cm  Lungs: clear but diminished at the bases bilaterally  Extremities: trace-1+ pretibial edema up to knees, + unna boots   Telemetry   Sinus tach low 100s (Personally reviewed)    Labs   CBC No results for input(s): WBC, NEUTROABS, HGB, HCT, MCV, PLT in the last 72 hours. Basic Metabolic Panel Recent Labs    98/86/73 0432 05/02/24 0450  NA 135 135  K 4.6 4.2   CL 96* 95*  CO2 28 29  GLUCOSE 193* 189*  BUN 46* 52*  CREATININE 1.78* 1.97*  CALCIUM  9.3 9.4  MG 2.0 2.0   Liver Function Tests No results for input(s): AST, ALT, ALKPHOS, BILITOT, PROT, ALBUMIN in the last 72 hours. No results for input(s): LIPASE, AMYLASE in the last 72 hours. Cardiac Enzymes No results for input(s): CKTOTAL, CKMB, CKMBINDEX, TROPONINI in the last 72 hours.  BNP: BNP (last 3 results) Recent Labs    12/11/23 1313 12/22/23 1035 03/01/24 1538  BNP 673.3* 140.3* 1,434.1*    ProBNP (last 3 results) Recent Labs    04/27/24 1935  PROBNP 12,483.0*     D-Dimer No results for input(s): DDIMER in the last 72 hours. Hemoglobin A1C No results for input(s): HGBA1C in the last 72 hours. Fasting Lipid Panel No results for input(s): CHOL, HDL, LDLCALC, TRIG, CHOLHDL, LDLDIRECT in the last 72 hours. Medications:   Scheduled Medications:  aspirin  EC  81 mg Oral Daily   atorvastatin   10 mg Oral Daily   buPROPion   150 mg Oral BID WC   digoxin   0.125 mg Oral Daily   enoxaparin  (LOVENOX ) injection  40 mg Subcutaneous Daily   furosemide   80 mg Intravenous Q12H   gabapentin   300 mg Oral BID   insulin  aspart  0-9 Units Subcutaneous TID WC  pantoprazole   40 mg Oral BID    Infusions:   PRN Medications: acetaminophen  **OR** acetaminophen , ondansetron  **OR** ondansetron  (ZOFRAN ) IV  Assessment/Plan   1. 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 Annella  - ANA, ANCA, [place]-70, RF all negative. - Sleep study with mild OSA.  - Combination WHO GROUP I & III - Echo 11/24 EF 60-65% RV mod reduced RVSP 92  - RHC 3/25: RA 4, PA  78/19, PCW 6, FICK 3.9/1.9, TD 3.6/1.7 PVR 10, Ao sat 95%, PA sat 57%, PAPi 14.8 c/w severe PAH w/ cor pulmonale, suspect Group 1 and possibly Group 3. She was started on Sildenafil  20 tid. She was later transitioned to Opsynvi  - Echo 10/25 EF 60-65%, mod reduced RV, RVSP 91.2 mmHg, severe TR - Repeated RHC 10/25 due to worsening symptoms. RA 9, PA 74/25 (46), PCW 8, TD CO/CI 6.7/3.4 - Now admitted w/ progressive NYHA IIIb symptoms + volume overload, in setting of dietary indiscretion w/ sodium  - Diuresing well w/ Lasix  and metolazone , volume improving but still w/ mild LEE. Repeat dose of 80 IV Lasix  this morning and plan for RHC later today    - Continue digoxin  0.125 mg daily.  - off Opsynvi , not taking because she feels like it is not helping. Does not want to restart - would like to start sotatercept now that she has stable housing. Will notify pharmacy team of new start, plan for OP initiation - off Farxiga  w/ recurrent UTIs    2. CAD - CABG 2005 LIMA-> LAD - Cath (04/16/21): mLAD 75% LIMA to LAD is widely patent. pRCA 50 - stable w/o angina  - Continue ASA/statin.   3. CKD IIIb - Most recent SCr baseline has been 1.5-2.0. - SCr stable at 1.9 today   - monitor w/ diuresis, RHC today  - off SGLT2i given UTIs    4. OSA - Mild on sleep study. Not on CPAP   5. HTN - controlled on current regimen  - no change    6. Smoking - cessation advised - continue Wellbutrin    7. Concern for recurrent UTI - Patient reports foul smelling urine - Cloudy appearance noted in canister - Will check UA/UC  8. Type 2DM  - CBGs elevated  - continue SSI - primary team to adjust regimen    Length of Stay: [address], PA-C  05/02/2024, 8:55 AM  Advanced Heart Failure Team Pager [phone] (M-F; 7a - 5p)   Please visit [email or web address]: For overnight coverage please call cardiology fellow first. If fellow not available call Shock/ECMO MD on call.  For ECMO / Mechanical Support  (Impella, IABP, LVAD) issues call Shock / ECMO MD on call.  5  Patient seen with PA, I formulated the plan and agree with the above note.   Weight down by 6 lbs.  Creatinine 1.94 => 1.78 = 1.97.    Still feels like she has some fluid.   General: NAD Neck: JVP 12-14 cm, no thyromegaly or thyroid  nodule.  Lungs: Clear to auscultation bilaterally with normal respiratory effort. CV: Nondisplaced PMI.  Heart regular S1/S2, no S3/S4, no murmur.  1+ ankle edema.  Abdomen: Soft, nontender, no hepatosplenomegaly,  no distention.  Skin: Intact without lesions or rashes.  Neurologic: Alert and oriented x 3.  Psych: Normal affect. Extremities: No clubbing or cyanosis.  HEENT: Normal.   Exam somewhat difficult but I think she still has some fluid on board.  Creatinine has remained relatively stable.  I will give her Lasix  80 mg IV bid + metolazone  2.5 x 1 again today.  Plan for RHC tomorrow morning to reassess PA pressure and filling pressures.  We discussed risks/benefits and she agrees to procedure.   Concern for UTI, getting UA and culture.   Ezra Shuck 05/02/2024 11:23 AM  "

## 2024-05-02 NOTE — Progress Notes (Signed)
 Obtained consent form for heart cath and placed it in pt's chart.   Amado GORMAN Arabia, RN

## 2024-05-02 NOTE — Inpatient Diabetes Management (Signed)
 Inpatient Diabetes Program Recommendations  AACE/ADA: New Consensus Statement on Inpatient Glycemic Control (2015)  Target Ranges:  Prepandial:   less than 140 mg/dL      Peak postprandial:   less than 180 mg/dL (1-2 hours)      Critically ill patients:  140 - 180 mg/dL   Lab Results  Component Value Date   GLUCAP 246 (H) 05/02/2024   HGBA1C 6.1 (H) 12/11/2023    Review of Glycemic Control  Latest Reference Range & Units 05/01/24 08:03 05/01/24 11:32 05/01/24 16:37 05/01/24 20:59 05/02/24 08:20  Glucose-Capillary 70 - 99 mg/dL 827 (H) 750 (H) 665 (H) 309 (H) 246 (H)  (H): Data is abnormally high  Diabetes history: DM2   Outpatient Diabetes medications:  Dexcom CGM  Toujeo  48 units daily  Metformin  1,000mg  BID    Current orders for Inpatient glycemic control:  Novolog  0-9 units TID  Inpatient Diabetes Program Recommendations:    Might consider:  Novolog  2 units TID with meals if she consumes at least 50% Semglee  8 units every day  Thank you, Wyvonna Pinal, MSN, CDCES Diabetes Coordinator Inpatient Diabetes Program [phone] (team pager from 8a-5p)

## 2024-05-03 ENCOUNTER — Encounter (HOSPITAL_COMMUNITY)

## 2024-05-03 ENCOUNTER — Encounter (HOSPITAL_COMMUNITY): Payer: Self-pay | Admitting: Cardiology

## 2024-05-03 ENCOUNTER — Other Ambulatory Visit (HOSPITAL_COMMUNITY): Payer: Self-pay

## 2024-05-03 ENCOUNTER — Encounter (HOSPITAL_COMMUNITY)
Admission: EM | Disposition: A | Discharge status: Home / Self Care | Payer: Self-pay | Source: Home / Self Care | Attending: Internal Medicine

## 2024-05-03 DIAGNOSIS — I272 Pulmonary hypertension, unspecified: Secondary | ICD-10-CM

## 2024-05-03 DIAGNOSIS — I5033 Acute on chronic diastolic (congestive) heart failure: Secondary | ICD-10-CM | POA: Diagnosis not present

## 2024-05-03 HISTORY — PX: RIGHT HEART CATH: CATH118263

## 2024-05-03 LAB — POCT I-STAT EG7
Acid-Base Excess: 7 mmol/L — ABNORMAL HIGH (ref 0.0–2.0)
Acid-Base Excess: 7 mmol/L — ABNORMAL HIGH (ref 0.0–2.0)
Bicarbonate: 32.2 mmol/L — ABNORMAL HIGH (ref 20.0–28.0)
Bicarbonate: 32.8 mmol/L — ABNORMAL HIGH (ref 20.0–28.0)
Calcium, Ion: 1.15 mmol/L (ref 1.15–1.40)
Calcium, Ion: 1.2 mmol/L (ref 1.15–1.40)
HCT: 33 % — ABNORMAL LOW (ref 36.0–46.0)
HCT: 33 % — ABNORMAL LOW (ref 36.0–46.0)
Hemoglobin: 11.2 g/dL — ABNORMAL LOW (ref 12.0–15.0)
Hemoglobin: 11.2 g/dL — ABNORMAL LOW (ref 12.0–15.0)
O2 Saturation: 47 %
O2 Saturation: 51 %
Potassium: 3.8 mmol/L (ref 3.5–5.1)
Potassium: 3.8 mmol/L (ref 3.5–5.1)
Sodium: 134 mmol/L — ABNORMAL LOW (ref 135–145)
Sodium: 136 mmol/L (ref 135–145)
TCO2: 34 mmol/L — ABNORMAL HIGH (ref 22–32)
TCO2: 34 mmol/L — ABNORMAL HIGH (ref 22–32)
pCO2, Ven: 48.6 mmHg (ref 44–60)
pCO2, Ven: 49.3 mmHg (ref 44–60)
pH, Ven: 7.428 (ref 7.25–7.43)
pH, Ven: 7.431 — ABNORMAL HIGH (ref 7.25–7.43)
pO2, Ven: 25 mmHg — CL (ref 32–45)
pO2, Ven: 27 mmHg — CL (ref 32–45)

## 2024-05-03 LAB — CBC
HCT: 30 % — ABNORMAL LOW (ref 36.0–46.0)
Hemoglobin: 8.8 g/dL — ABNORMAL LOW (ref 12.0–15.0)
MCH: 21.5 pg — ABNORMAL LOW (ref 26.0–34.0)
MCHC: 29.3 g/dL — ABNORMAL LOW (ref 30.0–36.0)
MCV: 73.2 fL — ABNORMAL LOW (ref 80.0–100.0)
Platelets: 342 K/uL (ref 150–400)
RBC: 4.1 MIL/uL (ref 3.87–5.11)
RDW: 21.4 % — ABNORMAL HIGH (ref 11.5–15.5)
WBC: 7 K/uL (ref 4.0–10.5)
nRBC: 0 % (ref 0.0–0.2)

## 2024-05-03 LAB — BASIC METABOLIC PANEL WITH GFR
Anion gap: 12 (ref 5–15)
BUN: 55 mg/dL — ABNORMAL HIGH (ref 8–23)
CO2: 30 mmol/L (ref 22–32)
Calcium: 9.7 mg/dL (ref 8.9–10.3)
Chloride: 90 mmol/L — ABNORMAL LOW (ref 98–111)
Creatinine, Ser: 1.91 mg/dL — ABNORMAL HIGH (ref 0.44–1.00)
GFR, Estimated: 28 mL/min — ABNORMAL LOW
Glucose, Bld: 251 mg/dL — ABNORMAL HIGH (ref 70–99)
Potassium: 3.9 mmol/L (ref 3.5–5.1)
Sodium: 132 mmol/L — ABNORMAL LOW (ref 135–145)

## 2024-05-03 LAB — GLUCOSE, CAPILLARY
Glucose-Capillary: 206 mg/dL — ABNORMAL HIGH (ref 70–99)
Glucose-Capillary: 173 mg/dL — ABNORMAL HIGH (ref 70–99)

## 2024-05-03 MED ORDER — HEPARIN (PORCINE) IN NACL 1000-0.9 UT/500ML-% IV SOLN
INTRAVENOUS | Status: DC | PRN
  Administered 2024-05-03: 500 mL

## 2024-05-03 MED ORDER — FREE WATER
250.0000 mL | Freq: Once | Status: AC
  Administered 2024-05-03: 250 mL via ORAL

## 2024-05-03 MED ORDER — ONDANSETRON HCL 4 MG/2ML IJ SOLN: 4.0000 mg | Freq: Four times a day (QID) | INTRAMUSCULAR | Status: DC | PRN

## 2024-05-03 MED ORDER — LABETALOL HCL 5 MG/ML IV SOLN: 10.0000 mg | INTRAVENOUS | Status: DC | PRN

## 2024-05-03 MED ORDER — POTASSIUM CHLORIDE CRYS ER 20 MEQ PO TBCR
20.0000 meq | EXTENDED_RELEASE_TABLET | Freq: Every day | ORAL | 0 refills | Status: AC
  Filled 2024-05-03: qty 60, 60d supply, fill #0

## 2024-05-03 MED ORDER — SODIUM CHLORIDE 0.9% FLUSH: 3.0000 mL | INTRAVENOUS | Status: DC | PRN

## 2024-05-03 MED ORDER — ENOXAPARIN SODIUM 40 MG/0.4ML IJ SOSY: 40.0000 mg | PREFILLED_SYRINGE | INTRAMUSCULAR | Status: DC

## 2024-05-03 MED ORDER — POTASSIUM CHLORIDE CRYS ER 20 MEQ PO TBCR
20.0000 meq | EXTENDED_RELEASE_TABLET | Freq: Once | ORAL | Status: AC
  Administered 2024-05-03: 20 meq via ORAL
  Filled 2024-05-03: qty 1

## 2024-05-03 MED ORDER — TORSEMIDE 20 MG PO TABS
40.0000 mg | ORAL_TABLET | Freq: Two times a day (BID) | ORAL | 0 refills | Status: AC
  Filled 2024-05-03: qty 240, 60d supply, fill #0

## 2024-05-03 MED ORDER — TORSEMIDE 20 MG PO TABS: 40.0000 mg | ORAL_TABLET | Freq: Two times a day (BID) | ORAL | Status: DC

## 2024-05-03 MED ORDER — LIDOCAINE HCL (PF) 1 % IJ SOLN
INTRAMUSCULAR | Status: AC
  Filled 2024-05-03: qty 30

## 2024-05-03 MED ORDER — LIDOCAINE HCL (PF) 1 % IJ SOLN
INTRAMUSCULAR | Status: DC | PRN
  Administered 2024-05-03: 2 mL

## 2024-05-03 MED ORDER — ACETAMINOPHEN 325 MG PO TABS: 650.0000 mg | ORAL_TABLET | ORAL | Status: DC | PRN

## 2024-05-03 MED ORDER — SODIUM CHLORIDE 0.9 % IV SOLN: 250.0000 mL | INTRAVENOUS | Status: DC | PRN

## 2024-05-03 MED ORDER — HYDRALAZINE HCL 20 MG/ML IJ SOLN: 10.0000 mg | INTRAMUSCULAR | Status: DC | PRN

## 2024-05-03 MED ORDER — SODIUM CHLORIDE 0.9% FLUSH: 3.0000 mL | Freq: Two times a day (BID) | INTRAVENOUS | Status: DC

## 2024-05-03 NOTE — Plan of Care (Signed)
 " Problem: Education: Goal: Knowledge of General Education information will improve Description: Including pain rating scale, medication(s)/side effects and non-pharmacologic comfort measures Outcome: Adequate for Discharge   Problem: Health Behavior/Discharge Planning: Goal: Ability to manage health-related needs will improve Outcome: Adequate for Discharge   Problem: Clinical Measurements: Goal: Ability to maintain clinical measurements within normal limits will improve Outcome: Adequate for Discharge Goal: Will remain free from infection Outcome: Adequate for Discharge Goal: Diagnostic test results will improve Outcome: Adequate for Discharge Goal: Respiratory complications will improve Outcome: Adequate for Discharge Goal: Cardiovascular complication will be avoided Outcome: Adequate for Discharge   Problem: Activity: Goal: Risk for activity intolerance will decrease Outcome: Adequate for Discharge   Problem: Nutrition: Goal: Adequate nutrition will be maintained Outcome: Adequate for Discharge   Problem: Coping: Goal: Level of anxiety will decrease Outcome: Adequate for Discharge   Problem: Elimination: Goal: Will not experience complications related to bowel motility Outcome: Adequate for Discharge Goal: Will not experience complications related to urinary retention Outcome: Adequate for Discharge   Problem: Pain Managment: Goal: General experience of comfort will improve and/or be controlled Outcome: Adequate for Discharge   Problem: Safety: Goal: Ability to remain free from injury will improve Outcome: Adequate for Discharge   Problem: Skin Integrity: Goal: Risk for impaired skin integrity will decrease Outcome: Adequate for Discharge   Problem: Education: Goal: Ability to describe self-care measures that may prevent or decrease complications (Diabetes Survival Skills Education) will improve Outcome: Adequate for Discharge Goal: Individualized Educational  Video(s) Outcome: Adequate for Discharge   Problem: Coping: Goal: Ability to adjust to condition or change in health will improve Outcome: Adequate for Discharge   Problem: Fluid Volume: Goal: Ability to maintain a balanced intake and output will improve Outcome: Adequate for Discharge   Problem: Health Behavior/Discharge Planning: Goal: Ability to identify and utilize available resources and services will improve Outcome: Adequate for Discharge Goal: Ability to manage health-related needs will improve Outcome: Adequate for Discharge   Problem: Metabolic: Goal: Ability to maintain appropriate glucose levels will improve Outcome: Adequate for Discharge   Problem: Nutritional: Goal: Maintenance of adequate nutrition will improve Outcome: Adequate for Discharge Goal: Progress toward achieving an optimal weight will improve Outcome: Adequate for Discharge   Problem: Skin Integrity: Goal: Risk for impaired skin integrity will decrease Outcome: Adequate for Discharge   Problem: Tissue Perfusion: Goal: Adequacy of tissue perfusion will improve Outcome: Adequate for Discharge   Problem: Acute Rehab PT Goals(only PT should resolve) Goal: Pt Will Go Supine/Side To Sit Outcome: Adequate for Discharge Goal: Patient Will Transfer Sit To/From Stand Outcome: Adequate for Discharge Goal: Pt Will Transfer Bed To Chair/Chair To Bed Outcome: Adequate for Discharge Goal: Pt Will Ambulate Outcome: Adequate for Discharge   Problem: Acute Rehab OT Goals (only OT should resolve) Goal: Pt. Will Perform Grooming Outcome: Adequate for Discharge Goal: Pt. Will Perform Lower Body Dressing Outcome: Adequate for Discharge Goal: Pt. Will Transfer To Toilet Outcome: Adequate for Discharge Goal: Pt. Will Perform Toileting-Clothing Manipulation Outcome: Adequate for Discharge   Problem: Education: Goal: Understanding of CV disease, CV risk reduction, and recovery process will improve Outcome:  Adequate for Discharge Goal: Individualized Educational Video(s) Outcome: Adequate for Discharge   Problem: Activity: Goal: Ability to return to baseline activity level will improve Outcome: Adequate for Discharge   Problem: Cardiovascular: Goal: Ability to achieve and maintain adequate cardiovascular perfusion will improve Outcome: Adequate for Discharge Goal: Vascular access site(s) Level 0-1 will be maintained Outcome: Adequate for Discharge  Problem: Health Behavior/Discharge Planning: Goal: Ability to safely manage health-related needs after discharge will improve Outcome: Adequate for Discharge   "

## 2024-05-03 NOTE — Progress Notes (Signed)
 PT Cancellation Note  Patient Details Name: Deborah Coffey MRN: [id number] DOB: Dec 04, 1955   Cancelled Treatment:    Reason Eval/Treat Not Completed: (P) Patient at procedure or test/unavailable. Will plan to follow-up later as time permits.   Theo Ferretti, PT, DPT Acute Rehabilitation Services  Office: [phone]    Theo CHRISTELLA Ferretti 05/03/2024, 9:57 AM

## 2024-05-03 NOTE — Discharge Summary (Signed)
 " [facility]  Physician Discharge Summary   Patient ID: Deborah Coffey MRN: [id number] DOB/AGE: Oct 06, 1955 69 y.o.  Admit date: 04/27/2024 Discharge date: 05/03/2024    PCP: [name], MD  DISCHARGE DIAGNOSES:    Acute on chronic diastolic CHF (congestive heart failure) (HCC) Pulmonary hypertension Cor pulmonale/acute RV failure   CAD (coronary artery disease) - CABG x 1 (LIMA-LAD)   Essential hypertension   Chronic kidney disease, stage 3b (HCC)   Type 2 diabetes mellitus with hyperlipidemia (HCC)   H/O TIA (transient ischemic attack) and stroke   Depression   Obesity, class 2   RECOMMENDATIONS FOR OUTPATIENT FOLLOW UP: Cardiology to schedule outpatient follow-up   Home Health: None Equipment/Devices: None  CODE STATUS: Full code  DISCHARGE CONDITION: fair  Diet recommendation: As before  INITIAL HISTORY: 69 y.o. female with CAD s/p CABG x1 (LIMA-LAD) 2005, tobacco use, chronic diastolic CHF, anemia, depression, HLD, HTN, DM, CKD stage 3b, prior TIAs, pulmonary HTN (felt due to underlying lung disease/smoking), and morbid obesity. Pt noticed increasing edema and sob on exertion for two weeks leading up to coming to hospital.  She was thought to be fluid overloaded and was hospitalized for further management.   Consultants: Cardiology   Procedures: Echocardiogram  HOSPITAL COURSE:   Acute diastolic CHF/acute RV failure/cor pulmonale/pulmonary hypertension Echocardiogram with preserved LV systolic function with EF 55 to 60%, interventricular septum is flattened in systole and diastole, RV systolic function with moderate reduction, RV with moderate to severe cavity enlargement, RVSP 82.6 mmHg, RA with severe enlargement, severe tricuspid regurgitation, moderate pulmonic regurgitation.  Patient was seen by cardiology.  She was diuresed.  Underwent right heart catheterization.  Switched over to oral diuretics.  Remains on digoxin .  Cleared for  discharge today.     CAD (coronary artery disease) - CABG x 1 (LIMA-LAD) High sensitive troponin elevation due to heart failure and volume overload, no acute coronary syndrome.   Essential hypertension   Chronic kidney disease, stage 3b (HCC) Baseline creatinine seems to be between 1.5 and 2.0.  Stable for the most part.     Diabetes mellitus type 2, uncontrolled with hyperglycemia She is on metformin  and glargine at home.   HbA1c last checked in August when it was 6.1.    Obesity Estimated body mass index is 35.75 kg/m as calculated from the following:   Height as of this encounter: 5' 4 (1.626 m).   Weight as of this encounter: 94.5 kg.    Patient is stable.  Cleared by cardiology for discharge today.  Okay for discharge.   PERTINENT LABS:  The results of significant diagnostics from this hospitalization (including imaging, microbiology, ancillary and laboratory) are listed below for reference.    Microbiology: Recent Results (from the past 240 hours)  Urine Culture     Status: Abnormal   Collection Time: 05/01/24  3:52 PM   Specimen: Urine, Clean Catch  Result Value Ref Range Status   Specimen Description URINE, CLEAN CATCH  Final   Special Requests   Final    NONE Reflexed from [id number] Performed at [facility], [address] [place], [place] [address]    Culture >=100,000 COLONIES/mL ESCHERICHIA COLI (A)  Final   Report Status 05/04/2024 FINAL  Final   Organism ID, Bacteria ESCHERICHIA COLI (A)  Final      Susceptibility   Escherichia coli - MIC*    AMPICILLIN >=32 RESISTANT Resistant     CEFAZOLIN (URINE) Value in next row Sensitive  8 SENSITIVEThis is a modified FDA-approved test that has been validated and its performance characteristics determined by the reporting laboratory.  This laboratory is certified under the Clinical Laboratory Improvement Amendments CLIA as qualified to perform high complexity clinical laboratory testing.    CEFEPIME Value in  next row Sensitive      8 SENSITIVEThis is a modified FDA-approved test that has been validated and its performance characteristics determined by the reporting laboratory.  This laboratory is certified under the Clinical Laboratory Improvement Amendments CLIA as qualified to perform high complexity clinical laboratory testing.    ERTAPENEM Value in next row Sensitive      8 SENSITIVEThis is a modified FDA-approved test that has been validated and its performance characteristics determined by the reporting laboratory.  This laboratory is certified under the Clinical Laboratory Improvement Amendments CLIA as qualified to perform high complexity clinical laboratory testing.    CEFTRIAXONE  Value in next row Sensitive      8 SENSITIVEThis is a modified FDA-approved test that has been validated and its performance characteristics determined by the reporting laboratory.  This laboratory is certified under the Clinical Laboratory Improvement Amendments CLIA as qualified to perform high complexity clinical laboratory testing.    CIPROFLOXACIN Value in next row Sensitive      8 SENSITIVEThis is a modified FDA-approved test that has been validated and its performance characteristics determined by the reporting laboratory.  This laboratory is certified under the Clinical Laboratory Improvement Amendments CLIA as qualified to perform high complexity clinical laboratory testing.    GENTAMICIN Value in next row Sensitive      8 SENSITIVEThis is a modified FDA-approved test that has been validated and its performance characteristics determined by the reporting laboratory.  This laboratory is certified under the Clinical Laboratory Improvement Amendments CLIA as qualified to perform high complexity clinical laboratory testing.    NITROFURANTOIN Value in next row Sensitive      8 SENSITIVEThis is a modified FDA-approved test that has been validated and its performance characteristics determined by the reporting laboratory.   This laboratory is certified under the Clinical Laboratory Improvement Amendments CLIA as qualified to perform high complexity clinical laboratory testing.    TRIMETH/SULFA Value in next row Sensitive      8 SENSITIVEThis is a modified FDA-approved test that has been validated and its performance characteristics determined by the reporting laboratory.  This laboratory is certified under the Clinical Laboratory Improvement Amendments CLIA as qualified to perform high complexity clinical laboratory testing.    AMPICILLIN/SULBACTAM Value in next row Resistant      8 SENSITIVEThis is a modified FDA-approved test that has been validated and its performance characteristics determined by the reporting laboratory.  This laboratory is certified under the Clinical Laboratory Improvement Amendments CLIA as qualified to perform high complexity clinical laboratory testing.    PIP/TAZO Value in next row Sensitive      <=4 SENSITIVEThis is a modified FDA-approved test that has been validated and its performance characteristics determined by the reporting laboratory.  This laboratory is certified under the Clinical Laboratory Improvement Amendments CLIA as qualified to perform high complexity clinical laboratory testing.    MEROPENEM Value in next row Sensitive      <=4 SENSITIVEThis is a modified FDA-approved test that has been validated and its performance characteristics determined by the reporting laboratory.  This laboratory is certified under the Clinical Laboratory Improvement Amendments CLIA as qualified to perform high complexity clinical laboratory testing.    * >=100,000 COLONIES/mL ESCHERICHIA  COLI     Labs:   Basic Metabolic Panel: Recent Labs  Lab 04/27/24 1935 04/28/24 0241 04/29/24 0501 04/30/24 0453 05/01/24 0432 05/02/24 0450 05/03/24 0435 05/03/24 1009  NA 137   < > 139 137 135 135 132* 136  134*  K 3.9   < > 4.1 3.9 4.6 4.2 3.9 3.8  3.8  CL 101   < > 100 99 96* 95* 90*  --   CO2  26   < > 29 29 28 29 30   --   GLUCOSE 125*   < > 190* 152* 193* 189* 251*  --   BUN 31*   < > 34* 39* 46* 52* 55*  --   CREATININE 1.53*   < > 1.94* 1.94* 1.78* 1.97* 1.91*  --   CALCIUM  9.4   < > 9.0 9.4 9.3 9.4 9.7  --   MG 1.9  --  1.9 2.2 2.0 2.0  --   --    < > = values in this interval not displayed.    CBC: Recent Labs  Lab 04/27/24 1935 04/28/24 0241 05/03/24 0435 05/03/24 1009  WBC 6.4 6.5 7.0  --   HGB 8.5* 8.2* 8.8* 11.2*  11.2*  HCT 30.6* 29.2* 30.0* 33.0*  33.0*  MCV 75.4* 74.9* 73.2*  --   PLT 307 314 342  --     BNP: BNP (last 3 results) Recent Labs    12/11/23 1313 12/22/23 1035 03/01/24 1538  BNP 673.3* 140.3* 1,434.1*    ProBNP (last 3 results) Recent Labs    04/27/24 1935  PROBNP 12,483.0*    CBG: Recent Labs  Lab 05/02/24 1126 05/02/24 1709 05/02/24 2146 05/03/24 0743 05/03/24 1136  GLUCAP 294* 288* 327* 206* 173*     IMAGING STUDIES CARDIAC CATHETERIZATION Result Date: 05/03/2024 1. Low filling pressures. 2. Severe pulmonary arterial hypertension. 3. Preserved cardiac output.   ECHOCARDIOGRAM COMPLETE Result Date: 04/29/2024    ECHOCARDIOGRAM REPORT   Patient Name:   Rosezella Stingley Date of Exam: 04/29/2024 Medical Rec #:  [id number]          Height:       64.0 in Accession #:    [id number]         Weight:       224.9 lb Date of Birth:  02-Mar-1956         BSA:          2.056 m Patient Age:    68 years           BP:           144/69 mmHg Patient Gender: F                  HR:           89 bpm. Exam Location:  Inpatient Procedure: 2D Echo, Cardiac Doppler, Color Doppler and Intracardiac            Opacification Agent (Both Spectral and Color Flow Doppler were            utilized during procedure). Indications:    Dyspnea R06.00  History:        Patient has prior history of Echocardiogram examinations, most                 recent 01/30/2024. CHF, CAD, Prior CABG, Pulmonary HTN, CKD,                 stage 3 and TIA,  Signs/Symptoms:Hypotension  and Dyspnea; Risk                 Factors:Hypertension, Diabetes, Sleep Apnea and Current Smoker.  Sonographer:    [name] RCS Referring Phys: [id number] MAURICIO DANIEL ARRIEN IMPRESSIONS  1. Left ventricular ejection fraction, by estimation, is 55 to 60%. The left ventricle has normal function. The left ventricle has no regional wall motion abnormalities. Left ventricular diastolic parameters were normal. There is the interventricular septum is flattened in systole and diastole, consistent with right ventricular pressure and volume overload.  2. Right ventricular systolic function is moderately reduced. The right ventricular size is moderate to severely enlarged. There is severely elevated pulmonary artery systolic pressure. The estimated right ventricular systolic pressure is 82.6 mmHg.  3. Right atrial size was severely dilated.  4. The mitral valve is normal in structure. No evidence of mitral valve regurgitation. No evidence of mitral stenosis.  5. Tricuspid valve regurgitation is severe.  6. The aortic valve is normal in structure. Aortic valve regurgitation is not visualized. No aortic stenosis is present.  7. Pulmonic valve regurgitation is moderate.  8. The inferior vena cava is dilated in size with >50% respiratory variability, suggesting right atrial pressure of 8 mmHg. Comparison(s): A prior study was performed on 01/30/2024. LVEF 60-65%, RV pressure and volume overload, mild RAE, severe TR, estimated RAP 8 mmHG. FINDINGS  Left Ventricle: Left ventricular ejection fraction, by estimation, is 55 to 60%. The left ventricle has normal function. The left ventricle has no regional wall motion abnormalities. Definity  contrast agent was given IV to delineate the left ventricular  endocardial borders. The left ventricular internal cavity size was normal in size. There is no left ventricular hypertrophy. The interventricular septum is flattened in systole and diastole, consistent  with right ventricular pressure and volume overload. Left ventricular diastolic parameters were normal. Right Ventricle: The right ventricular size is moderate to severely enlarged. Right vetricular wall thickness was not well visualized. Right ventricular systolic function is moderately reduced. There is severely elevated pulmonary artery systolic pressure. The tricuspid regurgitant velocity is 4.32 m/s, and with an assumed right atrial pressure of 8 mmHg, the estimated right ventricular systolic pressure is 82.6 mmHg. Left Atrium: Left atrial size was normal in size. Right Atrium: Right atrial size was severely dilated. Pericardium: There is no evidence of pericardial effusion. Mitral Valve: The mitral valve is normal in structure. No evidence of mitral valve regurgitation. No evidence of mitral valve stenosis. Tricuspid Valve: The tricuspid valve is grossly normal. Tricuspid valve regurgitation is severe. No evidence of tricuspid stenosis. Aortic Valve: The aortic valve is normal in structure. Aortic valve regurgitation is not visualized. No aortic stenosis is present. Aortic valve peak gradient measures 4.0 mmHg. Pulmonic Valve: The pulmonic valve was grossly normal. Pulmonic valve regurgitation is moderate. No evidence of pulmonic stenosis. Aorta: The aortic root and ascending aorta are structurally normal, with no evidence of dilitation. Venous: The inferior vena cava is dilated in size with greater than 50% respiratory variability, suggesting right atrial pressure of 8 mmHg. IAS/Shunts: There is left bowing of the interatrial septum, suggestive of elevated right atrial pressure. The atrial septum is grossly normal.  LEFT VENTRICLE PLAX 2D LVIDd:         4.70 cm   Diastology LVIDs:         3.00 cm   LV e' medial:    9.08 cm/s LV PW:         0.90 cm   LV E/e'  medial:  6.8 LV IVS:        0.90 cm   LV e' lateral:   13.60 cm/s LVOT diam:     2.06 cm   LV E/e' lateral: 4.5 LV SV:         32 LV SV Index:   15 LVOT  Area:     3.33 cm  RIGHT VENTRICLE            IVC RV S prime:     6.92 cm/s  IVC diam: 2.57 cm TAPSE (M-mode): 1.0 cm LEFT ATRIUM             Index        RIGHT ATRIUM           Index LA diam:        3.48 cm 1.69 cm/m   RA Area:     27.15 cm LA Vol (A2C):   37.1 ml 18.04 ml/m  RA Volume:   103.40 ml 50.28 ml/m LA Vol (A4C):   31.7 ml 15.42 ml/m LA Biplane Vol: 37.0 ml 17.99 ml/m  AORTIC VALVE                 PULMONIC VALVE AV Area (Vmax): 1.91 cm     PV Vmax:       0.91 m/s AV Vmax:        100.50 cm/s  PV Peak grad:  3.3 mmHg AV Peak Grad:   4.0 mmHg LVOT Vmax:      57.70 cm/s LVOT Vmean:     37.100 cm/s LVOT VTI:       0.095 m  AORTA Ao Root diam: 3.06 cm Ao Asc diam:  3.32 cm MITRAL VALVE               TRICUSPID VALVE MV Area (PHT): 6.37 cm    TR Peak grad:   74.6 mmHg MV Decel Time: 119 msec    TR Vmax:        432.00 cm/s MV E velocity: 61.80 cm/s MV A velocity: 98.50 cm/s  SHUNTS MV E/A ratio:  0.63        Systemic VTI:  0.09 m                            Systemic Diam: 2.06 cm Sunit Tolia Electronically signed by Madonna Large Signature Date/Time: 04/29/2024/12:25:50 PM    Final    DG Chest 2 View Result Date: 04/27/2024 CLINICAL DATA:  Chest pain. EXAM: CHEST - 2 VIEW COMPARISON:  January 28, 2024 FINDINGS: Multiple sternal wires and vascular clips are present. The cardiac silhouette is mildly enlarged and unchanged in size. Both lungs are clear. Multilevel degenerative changes are present throughout the thoracic spine. IMPRESSION: 1. Evidence of prior median sternotomy/CABG. 2. No acute cardiopulmonary disease. Electronically Signed   By: Suzen Dials M.D.   On: 04/27/2024 20:02    DISCHARGE EXAMINATION: See progress note from earlier today  DISPOSITION: Home  Discharge Instructions     (HEART FAILURE PATIENTS) Call MD:  Anytime you have any of the following symptoms: 1) 3 pound weight gain in 24 hours or 5 pounds in 1 week 2) shortness of breath, with or without a dry hacking cough 3)  swelling in the hands, feet or stomach 4) if you have to sleep on extra pillows at night in order to breathe.   Complete by: As directed    Call MD for:  difficulty breathing, headache or visual disturbances   Complete by: As directed    Call MD for:  extreme fatigue   Complete by: As directed    Call MD for:  persistant dizziness or light-headedness   Complete by: As directed    Call MD for:  persistant nausea and vomiting   Complete by: As directed    Call MD for:  severe uncontrolled pain   Complete by: As directed    Call MD for:  temperature >100.4   Complete by: As directed    Discharge instructions   Complete by: As directed    Please follow instructions provided by cardiology.  Seek attention if your symptoms recur.  Take your medications as prescribed.  You were cared for by a hospitalist during your hospital stay. If you have any questions about your discharge medications or the care you received while you were in the hospital after you are discharged, you can call the unit and asked to speak with the hospitalist on call if the hospitalist that took care of you is not available. Once you are discharged, your primary care physician will handle any further medical issues. Please note that NO REFILLS for any discharge medications will be authorized once you are discharged, as it is imperative that you return to your primary care physician (or establish a relationship with a primary care physician if you do not have one) for your aftercare needs so that they can reassess your need for medications and monitor your lab values. If you do not have a primary care physician, you can call [phone] for a physician referral.   Increase activity slowly   Complete by: As directed          Allergies as of 05/03/2024       Reactions   Fish Allergy Other (See Comments)   Cause gout   Porcine (pork) Protein-containing Drug Products Other (See Comments)   Causes gout   Ciprofloxacin Rash         Medication List     STOP taking these medications    buPROPion  150 MG 12 hr tablet Commonly known as: Wellbutrin  SR   furosemide  40 MG tablet Commonly known as: LASIX        TAKE these medications    acetaminophen  500 MG tablet Commonly known as: TYLENOL  Take 1,000 mg by mouth every 6 (six) hours as needed for headache or moderate pain (pain score 4-6).   aspirin  EC 81 MG tablet Take 81 mg by mouth daily.   atorvastatin  10 MG tablet Commonly known as: LIPITOR Take 1 tablet (10 mg total) by mouth daily.   B-D SINGLE USE SWABS REGULAR Pads USE AS DIRECTED   CALCIUM -MAGNESIUM -ZINC PO Take 1 tablet by mouth daily.   Dexcom G7 Receiver Devi USE AS DIRECTED   Dexcom G7 Sensor Misc Use to check blood glucose throughout the day. Changes sensors once every 10 days.   diclofenac  Sodium 1 % Gel Commonly known as: VOLTAREN  Apply 2 g topically daily as needed (muscle pain).   digoxin  0.125 MG tablet Commonly known as: LANOXIN  Take 1 tablet (0.125 mg total) by mouth daily.   Droplet Pen Needles 31G X 8 MM Misc Generic drug: Insulin  Pen Needle USE WITH TOUJEO  SOLOSTAR PEN EVERY DAY   gabapentin  300 MG capsule Commonly known as: NEURONTIN  Take 300 mg by mouth 2 (two) times daily.   metFORMIN  500 MG 24 hr tablet Commonly known as: GLUCOPHAGE -XR Take 1,000 mg by mouth 2 (two)  times daily.   multivitamin with minerals Tabs tablet Take 1 tablet by mouth daily.   nitroGLYCERIN  0.4 MG SL tablet Commonly known as: NITROSTAT  Place 1 tablet (0.4 mg total) under the tongue every 5 (five) minutes as needed for chest pain.   OneTouch Delica Plus Lancet33G Misc Use as instructed to test blood glucose 3 times daily.   OneTouch Verio test strip Generic drug: glucose blood Use as instructed to test blood glucose 3 times daily.   OneTouch Verio w/Device Kit Use as instructed to test blood glucose 3 times daily.   pantoprazole  40 MG tablet Commonly known as:  PROTONIX  Take 1 tablet (40 mg total) by mouth 2 (two) times daily.   potassium chloride  SA 20 MEQ tablet Commonly known as: KLOR-CON  M Take 1 tablet (20 mEq total) by mouth daily.   torsemide  20 MG tablet Commonly known as: DEMADEX  Take 2 tablets (40 mg total) by mouth 2 (two) times daily.   Toujeo  Max SoloStar 300 UNIT/ML Solostar Pen Generic drug: insulin  glargine (2 Unit Dial ) Inject 46 Units into the skin at bedtime. What changed: how much to take   vitamin B-12 50 MCG tablet Commonly known as: CYANOCOBALAMIN  Take 1,000 mcg by mouth daily.   Vitamin D  (Cholecalciferol) 25 MCG (1000 UT) Tabs Take 1,000 mcg by mouth daily.          Follow-up Information     [name], MD Follow up in 1 week(s).   Specialty: Family Medicine Why: post hospitalization follow up Contact information: [address] [place] [address] [place] [address] [phone]         [facility] Follow up.   Specialty: Cardiology Why: 05/11/24 at 9:30 AM   Hospital Follow up in the [facility] Contact information: [address] [place]  [address] [phone]        [name], MD. Go in 8 day(s).   Specialty: Family Medicine Why: Hospital follow up appointment 05/11/24 at 3:30 PM, NP-Placey PLEASE ARRIVE 10-15 minutes early.  PLEASE call to cancel/reschedule appointment if you CANNOT make it. Contact information: [address] [place] [address] [place] [address] [phone]                 TOTAL DISCHARGE TIME: 35 minutes  Courtney Fenlon Verdene  [facility] Pager on [email or web address]  05/04/2024, 12:12 PM    "

## 2024-05-03 NOTE — Progress Notes (Addendum)
 "    Advanced Heart Failure Rounding Note  Cardiologist: Toribio Fuel, MD  Chief Complaint: A/C RV failure and pulmonary HTN Patient Profile   Deborah Coffey is a 69 y.o. female with CAD s/p CABG x1 (LIMA-LAD) 2005, tobacco use, chronic diastolic CHF, anemia, depression, HLD, HTN, DM, CKD stage 3b, prior TIAs, pulmonary HTN (felt due to underlying lung disease/smoking), and morbid obesity.   Significant events:   04/27/24: Admitted with A/C RV failure with volume overload in the setting of dietary indiscretion.   Subjective:    Wt down another 4 lb after diuresis yesterday. Scr stable at 1.9  Feels better today, breathing improved. No complaints.   UA mod leukocytoses, no bacteria.   Objective:   Weight Range: 94.5 kg Body mass index is 35.75 kg/m.   Vital Signs:   Temp:  [97.9 F (36.6 C)-98.7 F (37.1 C)] 98.5 F (36.9 C) (01/15 0733) Pulse Rate:  [89-109] 93 (01/15 0733) Resp:  [14-20] 18 (01/15 0733) BP: (94-121)/(46-77) 94/61 (01/15 0733) SpO2:  [93 %-99 %] 94 % (01/15 0733) Weight:  [94.5 kg] 94.5 kg (01/15 0518) Last BM Date : 05/01/24  Weight change: Filed Weights   05/01/24 1839 05/02/24 0455 05/03/24 0518  Weight: 99 kg 96.3 kg 94.5 kg    Intake/Output:   Intake/Output Summary (Last 24 hours) at 05/03/2024 0740 Last data filed at 05/02/2024 2300 Gross per 24 hour  Intake 957 ml  Output 1400 ml  Net -443 ml     Physical Exam   General:  NAD  Cor: RRR. No MRG, JVD 7-8 cm  Lungs: clear  Extremities: trace b/l pretibial edema, + unna boots   Telemetry   NSR 80s (Personally reviewed)    Labs   CBC Recent Labs    05/03/24 0435  WBC 7.0  HGB 8.8*  HCT 30.0*  MCV 73.2*  PLT 342   Basic Metabolic Panel Recent Labs    98/86/73 0432 05/02/24 0450 05/03/24 0435  NA 135 135 132*  K 4.6 4.2 3.9  CL 96* 95* 90*  CO2 28 29 30   GLUCOSE 193* 189* 251*  BUN 46* 52* 55*  CREATININE 1.78* 1.97* 1.91*  CALCIUM  9.3 9.4 9.7  MG 2.0 2.0   --    Liver Function Tests No results for input(s): AST, ALT, ALKPHOS, BILITOT, PROT, ALBUMIN in the last 72 hours. No results for input(s): LIPASE, AMYLASE in the last 72 hours. Cardiac Enzymes No results for input(s): CKTOTAL, CKMB, CKMBINDEX, TROPONINI in the last 72 hours.  BNP: BNP (last 3 results) Recent Labs    12/11/23 1313 12/22/23 1035 03/01/24 1538  BNP 673.3* 140.3* 1,434.1*    ProBNP (last 3 results) Recent Labs    04/27/24 1935  PROBNP 12,483.0*     D-Dimer No results for input(s): DDIMER in the last 72 hours. Hemoglobin A1C No results for input(s): HGBA1C in the last 72 hours. Fasting Lipid Panel No results for input(s): CHOL, HDL, LDLCALC, TRIG, CHOLHDL, LDLDIRECT in the last 72 hours. Medications:   Scheduled Medications:  [START ON 05/04/2024] aspirin  EC  81 mg Oral Daily   atorvastatin   10 mg Oral Daily   digoxin   0.125 mg Oral Daily   enoxaparin  (LOVENOX ) injection  40 mg Subcutaneous Daily   gabapentin   300 mg Oral BID   insulin  aspart  0-9 Units Subcutaneous TID WC   pantoprazole   40 mg Oral BID   sodium chloride  flush  3 mL Intravenous Q12H    Infusions:  sodium chloride       PRN Medications: sodium chloride , acetaminophen  **OR** acetaminophen , ondansetron  **OR** ondansetron  (ZOFRAN ) IV, sodium chloride  flush  Assessment/Plan   1. 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 Annella  - ANA, ANCA, [place]-70, RF all negative. - Sleep study with mild OSA.  - Combination WHO GROUP I & III - Echo 11/24 EF 60-65% RV mod reduced RVSP 92  - RHC 3/25: RA 4, PA 78/19, PCW 6, FICK 3.9/1.9, TD 3.6/1.7 PVR 10, Ao sat  95%, PA sat 57%, PAPi 14.8 c/w severe PAH w/ cor pulmonale, suspect Group 1 and possibly Group 3. She was started on Sildenafil  20 tid. She was later transitioned to Opsynvi  - Echo 10/25 EF 60-65%, mod reduced RV, RVSP 91.2 mmHg, severe TR - Repeated RHC 10/25 due to worsening symptoms. RA 9, PA 74/25 (46), PCW 8, TD CO/CI 6.7/3.4 - Now admitted w/ progressive NYHA IIIb symptoms + volume overload, in setting of dietary indiscretion w/ sodium  - Diuresing well w/ Lasix  and metolazone , volume much improved. Wt down 16 lb.  - Plan RHC today  - Continue digoxin  0.125 mg daily.  - off Opsynvi , not taking because she feels like it is not helping. Does not want to restart - would like to start sotatercept now that she has stable housing. Will notify pharmacy team of new start, plan for OP initiation - off Farxiga  w/ recurrent UTIs    2. CAD - CABG 2005 LIMA-> LAD - Cath (04/16/21): mLAD 75% LIMA to LAD is widely patent. pRCA 50 - stable w/o angina  - Continue ASA/statin.   3. CKD IIIb - Most recent SCr baseline has been 1.5-2.0. - SCr stable at 1.9 today   - Plan RHC today  - off SGLT2i given UTIs    4. OSA - Mild on sleep study. Not on CPAP   5. HTN - controlled on current regimen  - no change    6. Smoking - cessation advised - continue Wellbutrin    7. Concern for recurrent UTI - Patient reports foul smelling urine - Cloudy appearance noted in canister - UA + leukocytes otherwise unremarkable   8. Type 2DM  - CBGs elevated  - continue SSI - primary team to adjust regimen    Length of Stay: [address] Deborah Coffey  05/03/2024, 7:40 AM  Advanced Heart Failure Team Pager [phone] (M-F; 7a - 5p)   Please visit [email or web address]: For overnight coverage please call cardiology fellow first. If fellow not available call Shock/ECMO MD on call.  For ECMO / Mechanical Support (Impella, IABP, LVAD) issues call Shock / ECMO MD on call.  5  Patient seen with PA, I formulated the plan  and agree with the above note.   Good diuresis yesterday, weight down 4 lbs.  Breathing much better.   RHC today:  Hemodynamics (mmHg) RA mean 6 RV 79/13 PA 83/25, mean 43 PCWP mean 2 Oxygen saturations: PA 49% AO 95% Cardiac Output (Fick) 4.84  Cardiac Index (Fick) 2.42 PVR 8 WU   General: NAD Neck: No JVD, no thyromegaly or thyroid  nodule.  Lungs: Clear to auscultation bilaterally with normal respiratory effort. CV: Nondisplaced PMI.  Heart regular  S1/S2, no S3/S4, no murmur.  No peripheral edema.    Abdomen: Soft, nontender, no hepatosplenomegaly, no distention.  Skin: Intact without lesions or rashes.  Neurologic: Alert and oriented x 3.  Psych: Normal affect. Extremities: No clubbing or cyanosis.  HEENT: Normal.   She has diuresed well, filling pressures now low. Creatinine stable 1.91.  RHC shows that she still has severe pulmonary arterial hypertension.  - No diuretics today, start torsemide  40 mg po bid tomorrow for home.  - Plan to start sotatercept as outpatient.   I think she can go home later today.  Follow CHF clinic.  Meds for home: torsemide  40 mg po bid, digoxin  0.125, atorvastatin  10, ASA 81 daily, KCl 20 daily.   Ezra Shuck 05/03/2024 10:20 AM  "

## 2024-05-03 NOTE — Progress Notes (Addendum)
 Physical Therapy Treatment Patient Details Name: Deborah Coffey MRN: [id number] DOB: [birth year] Today's Date: 05/03/2024   History of Present Illness 69 y.o. female presents to [facility] 04/27/24 with heart failure exacerbation. Chest x-ray showed cardiomegaly, mild hilar vascular congestion, and small L pleural effusion. S/p R heart cath 1/15. PMHx: CAD s/p CABG, CHF, HTN, HLD, depression, Tobacco use , T2DM, CKD, TIA, cva, pulmonary HTN, OSA    PT Comments  Focused session on transfer and gait training. She was able to elevate her trunk to sit up EOB on her own by using bed features, but needed minA to scoot her hips anteriorly. After providing faded feedback with multi-modal cues, pt was able to progress from needing modA to only needing CGA to transfer to stand from a slightly elevated EOB. Attempted to progress pt away from relying on the RW, but she was unable to take even just a few steps without LOB and minA for recover. Educated pt to use her walker with all standing mobility at d/c at this time. Educated pt on managing her CHF through daily weights, reducing sodium intake, rinsing canned foods, eating fresh/frozen fruits/veggies as able, and mobilizing frequently. Educated pt on progressing her activity tolerance gradually to her tolerance. Educated pt on energy conservation techniques. Provided her with CHF booklet. Pt verbalized understanding. Will continue to follow acutely.    If plan is discharge home, recommend the following: Assist for transportation;Help with stairs or ramp for entrance;Assistance with cooking/housework;A little help with walking and/or transfers   Can travel by private vehicle        Equipment Recommendations  None recommended by PT    Recommendations for Other Services       Precautions / Restrictions Precautions Precautions: Fall Recall of Precautions/Restrictions: Intact Restrictions Weight Bearing Restrictions Per Provider Order: No     Mobility   Bed Mobility Overal bed mobility: Needs Assistance Bed Mobility: Supine to Sit     Supine to sit: Min assist, HOB elevated, Used rails     General bed mobility comments: Cued pt to walk legs off R EOB and provided hand-over-hand cuing to place hands on rails to pull trunk up to sit, minA needed to scoot hips to EOB    Transfers Overall transfer level: Needs assistance Equipment used: Rolling walker (2 wheels) Transfers: Sit to/from Stand Sit to Stand: Min assist, Mod assist, Contact guard assist, From elevated surface           General transfer comment: x4 full sit <> stand reps from elevated EOB, x2 partial sit to stands. provided repeated cues on hand placement (1 on bed and 1 on RW), feet placement, rocking trunk to gain momentum, and bringing nose overt toe. Cued pt also to reach back prior to sitting for better eccentric control. Faded feedback provided to facilitate learning with fair carryover noted. Pt progressed from needing modA to stand to minA and eventually CGA    Ambulation/Gait Ambulation/Gait assistance: Contact guard assist, Min assist Gait Distance (Feet): 160 Feet Assistive device: Rolling walker (2 wheels), None Gait Pattern/deviations: Step-through pattern, Decreased stance time - left, Trunk flexed, Decreased stride length Gait velocity: decr Gait velocity interpretation: <1.8 ft/sec, indicate of risk for recurrent falls   General Gait Details: Pt maintains a flexed posture, but does correct it intermittently. Reports L>R foot pain impacting weight shifting. CGA for safety with no LOB when using RW. When attempting a short bout without RW, pt had a LOB and needed minA to recover.   Stairs  Wheelchair Mobility     Tilt Bed    Modified Rankin (Stroke Patients Only)       Balance Overall balance assessment: Needs assistance Sitting-balance support: Feet supported, No upper extremity supported Sitting balance-Leahy Scale:  Good Sitting balance - Comments: No LOB sitting EOB   Standing balance support: During functional activity, Reliant on assistive device for balance, Single extremity supported, Bilateral upper extremity supported, No upper extremity supported Standing balance-Leahy Scale: Fair Standing balance comment: Able to stand statically without UE support, but had LOB with attempt to ambulate without UE support, needing minA to recover. Reliant on RW to ambulate                            Communication Communication Communication: No apparent difficulties  Cognition Arousal: Alert Behavior During Therapy: WFL for tasks assessed/performed   PT - Cognitive impairments: No apparent impairments                         Following commands: Intact      Cueing Cueing Techniques: Verbal cues  Exercises      General Comments General comments (skin integrity, edema, etc.): Educated pt on managing her CHF through daily weights, reducing sodium intake, rinsing canned foods, eating fresh/frozen fruits/veggies as able, and mobilizing frequently. Educated pt on progressing her activity tolerance gradually to her tolerance. Educated pt on energy conservation techniques. Pt verbalized understanding.      Pertinent Vitals/Pain Pain Assessment Pain Assessment: Faces Faces Pain Scale: Hurts little more Pain Location: L>R foot Pain Descriptors / Indicators: Sore Pain Intervention(s): Limited activity within patient's tolerance, Monitored during session, Repositioned    Home Living                          Prior Function            PT Goals (current goals can now be found in the care plan section) Acute Rehab PT Goals Patient Stated Goal: to go home PT Goal Formulation: With patient Time For Goal Achievement: 05/13/24 Potential to Achieve Goals: Good Progress towards PT goals: Progressing toward goals    Frequency    Min 1X/week      PT Plan       Co-evaluation              AM-PAC PT 6 Clicks Mobility   Outcome Measure  Help needed turning from your back to your side while in a flat bed without using bedrails?: None Help needed moving from lying on your back to sitting on the side of a flat bed without using bedrails?: A Little Help needed moving to and from a bed to a chair (including a wheelchair)?: A Little Help needed standing up from a chair using your arms (e.g., wheelchair or bedside chair)?: A Little Help needed to walk in hospital room?: A Little Help needed climbing 3-5 steps with a railing? : A Little 6 Click Score: 19    End of Session Equipment Utilized During Treatment: Gait belt Activity Tolerance: Patient tolerated treatment well Patient left: in chair;with call bell/phone within reach;with chair alarm set   PT Visit Diagnosis: Other abnormalities of gait and mobility (R26.89);Muscle weakness (generalized) (M62.81);Unsteadiness on feet (R26.81);Difficulty in walking, not elsewhere classified (R26.2)     Time: 1130-1210 PT Time Calculation (min) (ACUTE ONLY): 40 min  Charges:    $Gait Training: 8-22 mins $  Therapeutic Activity: 23-37 mins PT General Charges $$ ACUTE PT VISIT: 1 Visit                     Theo Ferretti, PT, DPT Acute Rehabilitation Services  Office: [phone]    Theo CHRISTELLA Ferretti 05/03/2024, 1:21 PM

## 2024-05-03 NOTE — TOC Transition Note (Signed)
 Transition of Care [facility]) - Discharge Note   Patient Details  Name: Deborah Coffey MRN: [id number] Date of Birth: [birth year]  Transition of Care [facility]) CM/SW Contact:  Arlana JINNY Nicholaus [place] Phone Number: [phone] 05/03/2024, 11:43 AM   Clinical Narrative:   CSW spoke with patient over the phone who stated that her daughter will be providing transportation home.   Hospital follow up appointment 05/11/24 at 3:30 PM, NP-Placey       Barriers to Discharge: Continued Medical Work up   Patient Goals and CMS Choice            Discharge Placement                       Discharge Plan and Services Additional resources added to the After Visit Summary for                  DME Arranged: Environmental Consultant                    Social Drivers of Health (SDOH) Interventions SDOH Screenings   Food Insecurity: No Food Insecurity (04/28/2024)  Housing: Low Risk (04/29/2024)  Transportation Needs: Unmet Transportation Needs (04/28/2024)  Utilities: Not At Risk (04/28/2024)  Alcohol Screen: Low Risk (08/23/2023)  Depression (PHQ2-9): Medium Risk (01/02/2024)  Financial Resource Strain: Low Risk (08/23/2023)  Physical Activity: Inactive (08/23/2023)  Social Connections: Moderately Isolated (04/28/2024)  Stress: No Stress Concern Present (08/23/2023)  Tobacco Use: High Risk (04/29/2024)  Health Literacy: Adequate Health Literacy (08/23/2023)     Readmission Risk Interventions    01/30/2024    2:32 PM  Readmission Risk Prevention Plan  Transportation Screening Complete  Home Care Screening Complete  Medication Review (RN CM) Complete

## 2024-05-03 NOTE — Progress Notes (Signed)
 Explained discharge instructions to patient. Reviewed follow up appointment and next medication administration times. Also reviewed education. Patient verbalized having an understanding for instructions given. All belongings are in the patient's possession. Will pick up her TOC meds at the time of discharge. IV and telemetry were removed by patient's RN. No other needs verbalized.

## 2024-05-03 NOTE — Plan of Care (Signed)
  Problem: Clinical Measurements: Goal: Respiratory complications will improve Outcome: Progressing Goal: Cardiovascular complication will be avoided Outcome: Progressing   Problem: Activity: Goal: Risk for activity intolerance will decrease Outcome: Progressing   Problem: Nutrition: Goal: Adequate nutrition will be maintained Outcome: Progressing   Problem: Safety: Goal: Ability to remain free from injury will improve Outcome: Progressing   

## 2024-05-03 NOTE — Interval H&P Note (Signed)
 History and Physical Interval Note:  05/03/2024 9:55 AM  Deborah Coffey  has presented today for surgery, with the diagnosis of hp.  The various methods of treatment have been discussed with the patient and family. After consideration of risks, benefits and other options for treatment, the patient has consented to  Procedures: RIGHT HEART CATH (N/A) as a surgical intervention.  The patient's history has been reviewed, patient examined, no change in status, stable for surgery.  I have reviewed the patient's chart and labs.  Questions were answered to the patient's satisfaction.     Josua Ferrebee [employer]

## 2024-05-03 NOTE — Progress Notes (Signed)
 "  [facility] PROGRESS NOTE   Deborah Coffey FMW:[id number] DOB: [birth year] DOA: 04/27/2024  PCP: [name], MD  Brief History:  69 y.o. female with CAD s/p CABG x1 (LIMA-LAD) 2005, tobacco use, chronic diastolic CHF, anemia, depression, HLD, HTN, DM, CKD stage 3b, prior TIAs, pulmonary HTN (felt due to underlying lung disease/smoking), and morbid obesity. Pt noticed increasing edema and sob on exertion for two weeks leading up to coming to hospital.  She was thought to be fluid overloaded and was hospitalized for further management.  Consultants: Cardiology  Procedures: Echocardiogram    Subjective/Interval History: Patient feels well.  Shortness of breath is improved.  Still having occasional tremors/spasms mainly in her lower extremities.  But she is able to get around okay without any problems.  Looking forward to going home soon.     Assessment/Plan:  Acute diastolic CHF/acute RV failure/cor pulmonale/pulmonary hypertension Echocardiogram with preserved LV systolic function with EF 55 to 60%, interventricular septum is flattened in systole and diastole, RV systolic function with moderate reduction, RV with moderate to severe cavity enlargement, RVSP 82.6 mmHg, RA with severe enlargement, severe tricuspid regurgitation, moderate pulmonic regurgitation.  Cardiology is following.  Patient being diuresed.  Symptomatically she appears to have improved. Weight has decreased. Patient did undergo right heart catheterization today.  Further management per cardiology.  She has been switched over to oral torsemide  today.  She remains on digoxin .   CAD (coronary artery disease) - CABG x 1 (LIMA-LAD) High sensitive troponin elevation due to heart failure and volume overload, no acute coronary syndrome.   Essential hypertension Continue to monitor blood pressure.   Chronic kidney disease, stage 3b (HCC) Baseline creatinine seems to be between 1.5 and 2.0.  Stable for the  most part.  Monitor closely while she is getting diuresed.    Diabetes mellitus type 2, uncontrolled with hyperglycemia She is on metformin  and glargine at home.  Here she is just on SSI.  HbA1c last checked in August when it was 6.1.  Will reinitiate glargine.  Obesity Estimated body mass index is 35.75 kg/m as calculated from the following:   Height as of this encounter: 5' 4 (1.626 m).   Weight as of this encounter: 94.5 kg.   DVT Prophylaxis: Lovenox  Code Status: Full code Family Communication: Discussed with patient Disposition Plan: Home when cleared by cardiology    Medications: Scheduled:  [START ON 05/04/2024] aspirin  EC  81 mg Oral Daily   atorvastatin   10 mg Oral Daily   digoxin   0.125 mg Oral Daily   [START ON 05/04/2024] enoxaparin  (LOVENOX ) injection  40 mg Subcutaneous Q24H   free water   250 mL Oral Once   gabapentin   300 mg Oral BID   insulin  aspart  0-9 Units Subcutaneous TID WC   pantoprazole   40 mg Oral BID   sodium chloride  flush  3 mL Intravenous Q12H   [START ON 05/04/2024] torsemide   40 mg Oral BID   Continuous:  sodium chloride      PRN:sodium chloride , acetaminophen  **OR** acetaminophen , hydrALAZINE , labetalol , ondansetron  **OR** ondansetron  (ZOFRAN ) IV, sodium chloride  flush  Objective:  Vital Signs  Vitals:   05/03/24 0955 05/03/24 1000 05/03/24 1005 05/03/24 1010  BP: (!) 94/57  (!) 100/55 96/61  Pulse: 89 88 85 90  Resp: 15 19 16 18   Temp:      TempSrc:      SpO2: 96% (!) 81% 96% 90%  Weight:      Height:  Intake/Output Summary (Last 24 hours) at 05/03/2024 1033 Last data filed at 05/02/2024 2300 Gross per 24 hour  Intake 477 ml  Output 1400 ml  Net -923 ml   Filed Weights   05/01/24 1839 05/02/24 0455 05/03/24 0518  Weight: 99 kg 96.3 kg 94.5 kg    General appearance: Awake alert.  In no distress Resp: Clear to auscultation bilaterally.  Normal effort Cardio: S1-S2 is normal regular.  No S3-S4.  No rubs murmurs or  bruit GI: Abdomen is soft.  Nontender nondistended.  Bowel sounds are present normal.  No masses organomegaly Extremities: Covered with Unna boots. No focal neurological deficits   Lab Results:  Data Reviewed: I have personally reviewed following labs and reports of the imaging studies  CBC: Recent Labs  Lab 04/27/24 1935 04/28/24 0241 05/03/24 0435  WBC 6.4 6.5 7.0  HGB 8.5* 8.2* 8.8*  HCT 30.6* 29.2* 30.0*  MCV 75.4* 74.9* 73.2*  PLT 307 314 342    Basic Metabolic Panel: Recent Labs  Lab 04/27/24 1935 04/28/24 0241 04/29/24 0501 04/30/24 0453 05/01/24 0432 05/02/24 0450 05/03/24 0435  NA 137   < > 139 137 135 135 132*  K 3.9   < > 4.1 3.9 4.6 4.2 3.9  CL 101   < > 100 99 96* 95* 90*  CO2 26   < > 29 29 28 29 30   GLUCOSE 125*   < > 190* 152* 193* 189* 251*  BUN 31*   < > 34* 39* 46* 52* 55*  CREATININE 1.53*   < > 1.94* 1.94* 1.78* 1.97* 1.91*  CALCIUM  9.4   < > 9.0 9.4 9.3 9.4 9.7  MG 1.9  --  1.9 2.2 2.0 2.0  --    < > = values in this interval not displayed.    GFR: Estimated Creatinine Clearance: 31.9 mL/min (A) (by C-G formula based on SCr of 1.91 mg/dL (H)).  Coagulation Profile: Recent Labs  Lab 04/28/24 0241  INR 1.0   BNP (last 3 results) Recent Labs    04/27/24 1935  PROBNP 12,483.0*   CBG: Recent Labs  Lab 05/02/24 0820 05/02/24 1126 05/02/24 1709 05/02/24 2146 05/03/24 0743  GLUCAP 246* 294* 288* 327* 206*    Radiology Studies: CARDIAC CATHETERIZATION Result Date: 05/03/2024 1. Low filling pressures. 2. Severe pulmonary arterial hypertension. 3. Preserved cardiac output.       LOS: 6 days   Beva Remund [employer] on [email or web address]  05/03/2024, 10:33 AM   "

## 2024-05-03 NOTE — Inpatient Diabetes Management (Signed)
 Inpatient Diabetes Program Recommendations  AACE/ADA: New Consensus Statement on Inpatient Glycemic Control (2015)  Target Ranges:  Prepandial:   less than 140 mg/dL      Peak postprandial:   less than 180 mg/dL (1-2 hours)      Critically ill patients:  140 - 180 mg/dL   Lab Results  Component Value Date   GLUCAP 206 (H) 05/03/2024   HGBA1C 6.1 (H) 12/11/2023    Review of Glycemic Control  Latest Reference Range & Units 05/02/24 08:20 05/02/24 11:26 05/02/24 17:09 05/02/24 21:46 05/03/24 07:43   Diabetes history: DM2   Outpatient Diabetes medications:  Dexcom CGM  Toujeo  48 units daily  Metformin  1,000mg  BID    Current orders for Inpatient glycemic control:  Novolog  0-9 units TID   Inpatient Diabetes Program Recommendations:     Consider adding Lantus  10 units every day.  Thanks, Tinnie Minus, MSN, RNC-OB Diabetes Coordinator [phone] (8a-5p)

## 2024-05-04 ENCOUNTER — Telehealth: Payer: Self-pay | Admitting: *Deleted

## 2024-05-04 ENCOUNTER — Telehealth (HOSPITAL_COMMUNITY): Payer: Self-pay | Admitting: Pharmacist

## 2024-05-04 ENCOUNTER — Other Ambulatory Visit (HOSPITAL_COMMUNITY): Payer: Self-pay

## 2024-05-04 LAB — URINE CULTURE: Culture: >=100000 — AB

## 2024-05-04 NOTE — Transitions of Care (Post Inpatient/ED Visit) (Signed)
 "  05/04/2024  Name: Deborah Coffey MRN: [id number] DOB: [birth year]  Today's TOC FU Call Status: Today's TOC FU Call Status:: Successful TOC FU Call Completed TOC FU Call Complete Date: 05/04/24  Patient's Name and Date of Birth confirmed. Name, DOB  Transition Care Management Follow-up Telephone Call Date of Discharge: 05/03/24 Discharge Facility: [facility]) Type of Discharge: Inpatient Admission Primary Inpatient Discharge Diagnosis:: Acute on chronic diastolic CHF How have you been since you were released from the hospital?: Same Any questions or concerns?: No  Items Reviewed: Did you receive and understand the discharge instructions provided?: Yes Medications obtained,verified, and reconciled?: Yes (Medications Reviewed) Any new allergies since your discharge?: No Dietary orders reviewed?: Yes Type of Diet Ordered:: heart Healthy Do you have support at home?: Yes People in Home [RPT]: child(ren), adult Name of Support/Comfort Primary Source: Daughter/Joy  Medications Reviewed Today: Medications Reviewed Today     Reviewed by Lucky Andrea LABOR, RN (Registered Nurse) on 05/04/24 at [phone]  Med List Status: <None>   Medication Order Taking? Sig Documenting Provider Last Dose Status Informant  acetaminophen  (TYLENOL ) 500 MG tablet [id number] Yes Take 1,000 mg by mouth every 6 (six) hours as needed for headache or moderate pain (pain score 4-6). [provider]  Active Self, Pharmacy Records  Alcohol Swabs (B-D SINGLE USE SWABS REGULAR) PADS [id number]  USE AS DIRECTED  Patient not taking: Reported on 05/04/2024   Jegede, Olugbemiga E, MD  Active Self, Pharmacy Records  aspirin  EC 81 MG tablet [id number] Yes Take 81 mg by mouth daily. [provider]  Active Self, Pharmacy Records  atorvastatin  (LIPITOR) 10 MG tablet [id number] Yes Take 1 tablet (10 mg total) by mouth daily. Newlin, Enobong, MD  Active Self, Pharmacy Records  Blood Glucose Monitoring  Suppl [facility] VERIO) w/Device PRESSLEY [id number] Yes Use as instructed to test blood glucose 3 times daily. Newlin, Enobong, MD  Active Self, Pharmacy Records  CALCIUM -MAGNESIUM -ZINC PO [id number] Yes Take 1 tablet by mouth daily. [provider]  Active Self, Pharmacy Records  Continuous Glucose Receiver [facility] G7 [place]) ESPIRIDION [id number] Yes USE AS DIRECTED [name], MD  Active Self, Pharmacy Records  Continuous Glucose Sensor (DEXCOM G7 SENSOR) MISC [id number] Yes Use to check blood glucose throughout the day. Changes sensors once every 10 days. Newlin, Enobong, MD  Active Self, Pharmacy Records  diclofenac  Sodium (VOLTAREN ) 1 % GEL [id number] Yes Apply 2 g topically daily as needed (muscle pain). Newlin, Enobong, MD  Active Self, Pharmacy Records           Med Note RANELLE KEENE HERO   Tue Jan 03, 2024 12:18 PM)    digoxin  (LANOXIN ) 0.125 MG tablet [id number] Yes Take 1 tablet (0.125 mg total) by mouth daily. Bensimhon, Toribio SAUNDERS, MD  Active Self, Pharmacy Records           Med Note ANNELL, JEANNINE HERO   Thu Feb 02, 2024  1:49 PM)    DROPLET PEN NEEDLES 31G X 8 MM MISC [id number] Yes USE WITH TOUJEO  SOLOSTAR PEN EVERY DAY Newlin, Enobong, MD  Active Self, Pharmacy Records  gabapentin  (NEURONTIN ) 300 MG capsule [id number] Yes Take 300 mg by mouth 2 (two) times daily. [provider]  Active Self, Pharmacy Records  glucose blood [facility] VERIO) test strip [id number] Yes Use as instructed to test blood glucose 3 times daily. Newlin, Enobong, MD  Active Self, Pharmacy Records  insulin  glargine, 2 Unit Dial , (TOUJEO  MAX SOLOSTAR) 300 UNIT/ML Solostar Pen [id number] Yes Inject  46 Units into the skin at bedtime.  Patient taking differently: Inject 48 Units into the skin at bedtime.   Newlin, Enobong, MD  Active Self, Pharmacy Records  Lancets [facility] CATHRYNE PLUS [place]) [place] [id number] Yes Use as instructed to test blood glucose 3 times daily. Newlin, Enobong, MD  Active Self, Pharmacy  Records  metFORMIN  (GLUCOPHAGE -XR) 500 MG 24 hr tablet [id number] Yes Take 1,000 mg by mouth 2 (two) times daily. [provider]  Active Self, Pharmacy Records  Multiple Vitamin (MULTIVITAMIN WITH MINERALS) TABS tablet [id number] Yes Take 1 tablet by mouth daily. [provider]  Active Self, Pharmacy Records  nitroGLYCERIN  (NITROSTAT ) 0.4 MG SL tablet [id number] Yes Place 1 tablet (0.4 mg total) under the tongue every 5 (five) minutes as needed for chest pain. Vonna Sharlet POUR, MD  Active Self, Pharmacy Records           Med Note RANELLE, KEENE HERO   Tue Jan 03, 2024 12:21 PM)    pantoprazole  (PROTONIX ) 40 MG tablet [id number] Yes Take 1 tablet (40 mg total) by mouth 2 (two) times daily. Newlin, Enobong, MD  Active Self, Pharmacy Records  potassium chloride  SA (KLOR-CON  M) 20 MEQ tablet [id number] Yes Take 1 tablet (20 mEq total) by mouth daily. Krishnan, Gokul, MD  Active   torsemide  (DEMADEX ) 20 MG tablet [id number] Yes Take 2 tablets (40 mg total) by mouth 2 (two) times daily. Krishnan, Gokul, MD  Active   vitamin B-12 (CYANOCOBALAMIN ) 50 MCG tablet [id number] Yes Take 1,000 mcg by mouth daily. [provider]  Active Self, Pharmacy Records  Vitamin D , Cholecalciferol, 25 MCG (1000 UT) TABS [id number] Yes Take 1,000 mcg by mouth daily. [provider]  Active Self, Pharmacy Records            Home Care and Equipment/Supplies: Were Home Health Services Ordered?: No Any new equipment or medical supplies ordered?: No  Functional Questionnaire: Do you need assistance with bathing/showering or dressing?: No Do you need assistance with meal preparation?: No Do you need assistance with eating?: No Do you have difficulty maintaining continence: No Do you need assistance with getting out of bed/getting out of a chair/moving?: No Do you have difficulty managing or taking your medications?: No  Follow up appointments reviewed: PCP Follow-up appointment  confirmed?: Yes Date of PCP follow-up appointment?: 05/11/24 Follow-up Provider: Ronal Jenkins Houseman, NP Specialist Hospital Follow-up appointment confirmed?: Yes Date of Specialist follow-up appointment?: 05/11/24 Follow-Up Specialty Provider:: Heart and Vascular Do you need transportation to your follow-up appointment?: No Do you understand care options if your condition(s) worsen?: Yes-patient verbalized understanding  SDOH Interventions Today    Flowsheet Row Most Recent Value  SDOH Interventions   Food Insecurity Interventions Intervention Not Indicated  Housing Interventions Intervention Not Indicated  Transportation Interventions Intervention Not Indicated  [Patient's daughter is able to provide transportation at this time.]  Utilities Interventions Intervention Not Indicated    Andrea Dimes RN, BSN [place]  [facility] RN Care Manager [phone]  "

## 2024-05-04 NOTE — Telephone Encounter (Signed)
 Received message that patient has more stable housing now and needs to start Winrevair. Called [employer] and confirmed new enrollment form will be needed before medication can be shipped. PA is already approved through 04/18/2025.  Called patient and confirmed she would still like [facility] and obtained permission to resend referral. She is aware [facility] will be reaching out to coordinate shipment and injection training. She is also aware that repeat CBC's will be needed before her first 5 doses of Winrevair. CBC before baseline dose has already been obtained. She expressed understanding.  Updated Winrevair enrollment faxed to [employer] and message communicated to [facility] regarding need to reopen the referral.   Tinnie Redman, PharmD, BCPS, BCCP, CPP Heart Failure Clinic Pharmacist [phone]

## 2024-05-08 ENCOUNTER — Encounter (HOSPITAL_COMMUNITY)

## 2024-05-09 ENCOUNTER — Other Ambulatory Visit: Payer: Self-pay | Admitting: Family Medicine

## 2024-05-09 ENCOUNTER — Telehealth: Payer: Self-pay | Admitting: Family Medicine

## 2024-05-09 DIAGNOSIS — E118 Type 2 diabetes mellitus with unspecified complications: Secondary | ICD-10-CM

## 2024-05-09 NOTE — Progress Notes (Signed)
 "  ADVANCED HF CLINIC NOTE  PCP: [name], MD Primary Cardiologist: Toribio Fuel, MD HF Cardiologist: Dr. Fuel  HPI: Deborah Coffey is a 69 y.o. with CAD s/p CABG x1 (LIMA-LAD) 2005, tobacco use, chronic diastolic CHF, anemia, depression, HLD, HTN, DM, CKD stage 3b, prior TIAs, pulmonary HTN (felt due to underlying lung disease/smoking), and morbid obesity.    Echo12/22 EF 65-70% RV moderately dilated with moderate HK. Flattened septum Moderate TR RVSP est 110 mmHG    Chest CT scan 2/22. No PE   R/L cath on 04/16/21: - mLAD 75% LIMA to LAD is widely patent. pRCA 50% - EF 65% - RA 5 PA 79/18 (39) PCW 12  CO 6.4 L/min; CI PVR 4.2  Sleep study 06/2021 only c/w mild OSA AHI 5.5   Echo 11/24 EF 60-65%, IVC flattened in systole and diastole, c/w RV pressure and volume overload.  RV severely enlarged w/ severely reduced systolic function. The estimated RVSP is 92.5 mmHg (comparred to 111 mmHg previous study). RA severely dilated. Moderate TR    PFTs 12/24: FEV1 1.27 (50%) FVC 1.84 (55),ratio 77, DLCOcor 48. Moderately severe restriction with reversibility (12% change) and severe diffusion defect. She is being followed by [facility] (Dr. Brenna). He has also being following her for pulmonary nodules.   Echo 11/24 EF 60-65% RV mod reduced RVSP 92   RHC 3/25 demonstrated severe PAH w/ cor pulmonale, suspect Group 1 and possibly Group 3. She was started on Sildenafil  20 tid.  RA = 4, RV = 77/6, PA =  78/19 (42), PCW = 6, Fick CO/CI = 3.9/1.9, Thermo CO/CI = 3.6/1.7, PVR = 9.3 (Fick) 10.0 (TD), Ao sat = 96%, PA sat = 57%, 58%, PAPi = 14.8]  10/2023 Placed on Opsynvi   Admitted with 8/25 A/C HFpEF. Echo LVEF 60-65%, septal flattening in systole and diastole, RV severely enlarged and moderately HK, RVSP 133 mmHg, RA severe dilated, severe TR, dilated IVC. Diuresed with IV lasix  and transitioned to lasix  60 mg twice a day. Digoxin  added. At discharge Opsynvi  restarted.    Admitted 10/25 for chest and abdominal pan felt to be related to anxiety attack. Cardiac workup was unremarkable, although possible that this was caused by pHTN after explanation from patient at office visit today. CT dissection negative.  She returns today for heart failure follow up. Overall feeling poor. NYHA IIIb. Reports dyspnea and fatigue. Denies chest pain, orthopnea, dizziness, and abnormal bleeding. Appetite okay. Compliant with all medications, except opsynvi , she feels that this has not helped her and is hurting her.  Past Medical History:  Diagnosis Date   Allergy    Anemia    CAD in native artery 02/18/2004   Mid LAD lesoin just after Large D1 --> CABG x 1 LIMA-LAD   CAP (community acquired pneumonia) 08/07/2014   CHF (congestive heart failure) (HCC)    Chronic kidney disease, stage 3b (HCC)    Depression    High cholesterol    History of blood transfusion 04/20/2003   related to OHS    Hypertension    Lung nodules    Mild carotid artery disease    Pulmonary hypertension (HCC)    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 competitive flow   TIA (transient ischemic attack)    Type II diabetes mellitus (HCC)    Current Outpatient Medications  Medication Sig Dispense Refill   acetaminophen  (TYLENOL ) 500 MG tablet Take 1,000 mg by  mouth every 6 (six) hours as needed for headache or moderate pain (pain score 4-6).     Alcohol Swabs (B-D SINGLE USE SWABS REGULAR) PADS USE AS DIRECTED (Patient not taking: Reported on 05/04/2024) 100 each 0   aspirin  EC 81 MG tablet Take 81 mg by mouth daily.     atorvastatin  (LIPITOR) 10 MG tablet Take 1 tablet (10 mg total) by mouth daily. 90 tablet 1   Blood Glucose Monitoring Suppl (ONETOUCH VERIO) w/Device KIT Use as instructed to test blood glucose 3 times daily. 1 kit 0   CALCIUM -MAGNESIUM -ZINC PO Take 1 tablet by mouth daily.     Continuous Glucose Receiver (DEXCOM G7 RECEIVER) DEVI  USE AS DIRECTED 1 each 3   Continuous Glucose Sensor (DEXCOM G7 SENSOR) MISC USE TO CHECK BLOOD GLUCOSE THROUGHOUT THE DAY. CHANGES SENSORS ONCE EVERY 10 DAYS. 9 each 3   diclofenac  Sodium (VOLTAREN ) 1 % GEL Apply 2 g topically daily as needed (muscle pain). 150 g 1   digoxin  (LANOXIN ) 0.125 MG tablet Take 1 tablet (0.125 mg total) by mouth daily. 30 tablet 6   DROPLET PEN NEEDLES 31G X 8 MM MISC USE WITH TOUJEO  SOLOSTAR PEN EVERY DAY 100 each 3   gabapentin  (NEURONTIN ) 300 MG capsule Take 300 mg by mouth 2 (two) times daily.     glucose blood (ONETOUCH VERIO) test strip Use as instructed to test blood glucose 3 times daily. 100 each 3   insulin  glargine, 2 Unit Dial , (TOUJEO  MAX SOLOSTAR) 300 UNIT/ML Solostar Pen Inject 46 Units into the skin at bedtime. (Patient taking differently: Inject 48 Units into the skin at bedtime.) 18 mL 3   Lancets (ONETOUCH [place] PLUS LANCET33G) MISC Use as instructed to test blood glucose 3 times daily. 100 each 3   metFORMIN  (GLUCOPHAGE -XR) 500 MG 24 hr tablet Take 1,000 mg by mouth 2 (two) times daily.     Multiple Vitamin (MULTIVITAMIN WITH MINERALS) TABS tablet Take 1 tablet by mouth daily.     nitroGLYCERIN  (NITROSTAT ) 0.4 MG SL tablet Place 1 tablet (0.4 mg total) under the tongue every 5 (five) minutes as needed for chest pain. 25 tablet 0   pantoprazole  (PROTONIX ) 40 MG tablet Take 1 tablet (40 mg total) by mouth 2 (two) times daily. 180 tablet 1   potassium chloride  SA (KLOR-CON  M) 20 MEQ tablet Take 1 tablet (20 mEq total) by mouth daily. 60 tablet 0   torsemide  (DEMADEX ) 20 MG tablet Take 2 tablets (40 mg total) by mouth 2 (two) times daily. 240 tablet 0   vitamin B-12 (CYANOCOBALAMIN ) 50 MCG tablet Take 1,000 mcg by mouth daily.     Vitamin D , Cholecalciferol, 25 MCG (1000 UT) TABS Take 1,000 mcg by mouth daily.     No current facility-administered medications for this visit.   Allergies  Allergen Reactions   Fish Allergy Other (See Comments)     Cause gout   Porcine (Pork) Protein-Containing Drug Products Other (See Comments)    Causes gout   Ciprofloxacin Rash   Social History   Socioeconomic History   Marital status: Divorced    Spouse name: Not on file   Number of children: Not on file   Years of education: 12   Highest education level: Some college, no degree  Occupational History   Occupation: Disabled  Tobacco Use   Smoking status: Every Day    Current packs/day: 0.25    Average packs/day: 0.5 packs/day for 51.1 years (25.3 ttl pk-yrs)  Types: Cigarettes    Start date: 18   Smokeless tobacco: Current   Tobacco comments:    10 Cigarettes a day. 08/22/2023  Vaping Use   Vaping status: Some Days   Substances: Flavoring  Substance and Sexual Activity   Alcohol use: Not Currently   Drug use: No    Types: Marijuana    Comment: smoked pot years ago but not currently   Sexual activity: Not Currently  Other Topics Concern   Not on file  Social History Narrative   Lives with daughter and 3 grandchildren   Social Drivers of Health   Tobacco Use: High Risk (04/29/2024)   Patient History    Smoking Tobacco Use: Every Day    Smokeless Tobacco Use: Current    Passive Exposure: Not on file  Financial Resource Strain: Low Risk (08/23/2023)   Overall Financial Resource Strain (CARDIA)    Difficulty of Paying Living Expenses: Not hard at all  Food Insecurity: No Food Insecurity (05/04/2024)   Epic    Worried About Programme Researcher, Broadcasting/film/video in the Last Year: Never true    Ran Out of Food in the Last Year: Never true  Transportation Needs: Unmet Transportation Needs (05/04/2024)   Epic    Lack of Transportation (Medical): Yes    Lack of Transportation (Non-Medical): Yes  Physical Activity: Inactive (08/23/2023)   Exercise Vital Sign    Days of Exercise per Week: 0 days    Minutes of Exercise per Session: 0 min  Stress: No Stress Concern Present (08/23/2023)   [employer] of Occupational Health - Occupational Stress  Questionnaire    Feeling of Stress : Not at all  Social Connections: Moderately Isolated (04/28/2024)   Social Connection and Isolation Panel    Frequency of Communication with Friends and Family: Once a week    Frequency of Social Gatherings with Friends and Family: Once a week    Attends Religious Services: 1 to 4 times per year    Active Member of [employer] or Organizations: Yes    Attends Banker Meetings: 1 to 4 times per year    Marital Status: Widowed  Intimate Partner Violence: Not At Risk (05/04/2024)   Epic    Fear of Current or Ex-Partner: No    Emotionally Abused: No    Physically Abused: No    Sexually Abused: No  Depression (PHQ2-9): Low Risk (05/04/2024)   Depression (PHQ2-9)    PHQ-2 Score: 0  Alcohol Screen: Low Risk (08/23/2023)   Alcohol Screen    Last Alcohol Screening Score (AUDIT): 0  Housing: Unknown (05/04/2024)   Epic    Unable to Pay for Housing in the Last Year: No    Number of Times Moved in the Last Year: Not on file    Homeless in the Last Year: No  Utilities: Not At Risk (05/04/2024)   Epic    Threatened with loss of utilities: No  Health Literacy: Adequate Health Literacy (08/23/2023)   B1300 Health Literacy    Frequency of need for help with medical instructions: Never   Family History  Problem Relation Age of Onset   Heart disease Mother    Diabetes Mother    Cancer Mother        cervical   Diabetes Father    Diabetes Sister    Heart disease Sister    Lupus Sister    Heart failure Sister    Hepatitis C Sister    Hypertension Daughter    Diabetes  Brother    Heart attack Brother    Heart failure Brother    Diabetes Brother    Colon cancer Neg Hx    Breast cancer Neg Hx    LMP  (LMP Unknown)   Wt Readings from Last 3 Encounters:  05/04/24 94.8 kg (209 lb)  05/03/24 94.5 kg (208 lb 4.8 oz)  03/01/24 100.7 kg (222 lb)   PHYSICAL EXAM: General: Well appearing. No distress. Arrived by [facility] Cardiac: JVP ~8cm cm. No murmurs  Resp:  Lung sounds clear and equal B/L Extremities: Warm and dry.  No peripheral edema.  Neuro: A&O x3. Affect pleasant.   ECG (personally reviewed): ST 112 bpm with a PVC   ASSESSMENT & PLAN:  1. Pulmonary HTN with RV failure/cor pulmonale - Echo (12/22): EF 65-70% RV moderately dilated with moderate HK, flattened septum, moderate TR, RVSP est 110 mmHG   - Chest CT scan (2/22): No PE - RHC (12/22): RA 5 PA 79/18 (39) PCW 12  CO 6.4 L/min; CI PVR 4.2 - PFTs with significant restrictive/obstructive lung disease - ANA, ANCA, [place]-70, RF all negative. - Sleep study with mild OSA.  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 She was started on Sildenafil  20 tid. She was later transitioned to Opsynvi  - Most recent echo with EF 60-65%, mod reduced RV, RVSP 91.2 mmHg, severe TR - Repeated RHC due to worsening symptoms. RA 9, PA 74/25 (46), PCW 8, TD CO/CI 6.7/3.4 - Progressive NYHA IIIb symptoms. Appears relatively dry.  - continue lasix  60 mg bid  - off Opsynvi , not taking because she feels like it is not helping. Does not want to restart - would like to start sotatercept, however struggling to find a place to mail medication; only has PO box - Stop Farxiga  with recent UTI - Previous sleep study with just mild OSA. Follows with Pulmonary - Refer to pulmonary rehab - needs to call to schedule, number placed in AVS  2. CAD - CABG 2005 LIMA-> LAD - Cath (04/16/21): mLAD 75% LIMA to LAD is widely patent. pRCA 50 - Continue ASA/statin.  3. Morbid obesity - There is no height or weight on file to calculate BMI.. - Off Ozempic  - Discussed weight loss as an important part of treatment  4. OSA - Mild on sleep study. Not on CPAP - Has follow up with Dr Annella.   5. HTN - Blood pressure well  controlled. Continue current regimen.  6. Smoking - Discussed need for smoking cessation  7. Recent UTI - treated with antibiotics; completed  Follow up in 2 month with APP  Harlene CHRISTELLA Gainer, FNP  1:29 PM  "

## 2024-05-09 NOTE — Telephone Encounter (Signed)
 Contacted pt left vm to confirmed appt (per vr)

## 2024-05-10 ENCOUNTER — Encounter (HOSPITAL_COMMUNITY)

## 2024-05-10 ENCOUNTER — Telehealth (HOSPITAL_COMMUNITY): Payer: Self-pay

## 2024-05-10 ENCOUNTER — Ambulatory Visit: Admitting: Pulmonary Disease

## 2024-05-10 ENCOUNTER — Encounter: Payer: Self-pay | Admitting: Pulmonary Disease

## 2024-05-10 VITALS — BP 133/68 | HR 110 | Temp 98.1°F | Ht 65.0 in | Wt 210.4 lb

## 2024-05-10 DIAGNOSIS — F17210 Nicotine dependence, cigarettes, uncomplicated: Secondary | ICD-10-CM

## 2024-05-10 DIAGNOSIS — J984 Other disorders of lung: Secondary | ICD-10-CM

## 2024-05-10 DIAGNOSIS — I272 Pulmonary hypertension, unspecified: Secondary | ICD-10-CM | POA: Diagnosis not present

## 2024-05-10 DIAGNOSIS — J4530 Mild persistent asthma, uncomplicated: Secondary | ICD-10-CM

## 2024-05-10 NOTE — Telephone Encounter (Signed)
 Called and spoke to pt's daughter Zada to confirm/remind patient of [facility] on [facility].   Appointment:   [x] Confirmed  [] Left mess   [] No answer/No voice mail  [] VM Full/unable to leave message  [] Phone not in service  Patient reminded to bring all medications and/or complete list.  Confirmed patient has transportation. Gave directions, instructed to utilize valet parking.

## 2024-05-10 NOTE — Patient Instructions (Signed)
 No change in medication  Glad you are doing well  I do think that is gout in the left ankle, please discuss with your primary care doctor tomorrow  I will update Dr. Rolan  Return to clinic in 4 months or sooner as needed with Dr. Annella

## 2024-05-10 NOTE — Progress Notes (Signed)
 "  @Patient  ID: Deborah Coffey, female    DOB: 01-Apr-1956, 69 y.o.   MRN: [id number]  Chief Complaint  Patient presents with   Asthma    Pt states since LOV breathing has been good Pt stated she was in hospital last week due to her having fluid in legs    Referring provider: [name], MD  HPI:   69 y.o. woman history of pulmonary hypertension whom we are seeing for follow-up of lung nodule and pulmonary hypertension and recent hospitalization.  Multiple notes and hospitalization reviewed.  Most recent cardiology note reviewed.  Admit to the hospital.  CHF exacerbation.  Was diuresed.  Repeat right heart cath performed PVR elevated, PA pressure higher.  She was not taking her combination tadalafil /macitentan .  Per her report she did not think it helped.  She did not resume it.  I asked her about this.  She has no recollection of stopping it.  She know she is not taking it now.  She denies saying it did not help.  Regardless is not taking this.  Plans to start sotatercept appears per most recent documentation.  Her swelling is doing well on her current diuretic regimen.  She appears euvolemic today.   HPI initial visit: Patient diagnosed with pulmonary hypertension.  Wedge normal, PVR approximately 10.  Cardiac index depressed 1.9 06/2023.  Placed on sildenafil  initially.  Recently transition to combination tadalafil /macitentan .  Tolerating well.  Recently admitted to the hospital.  With fluid overload.  Was diuresed.  Weight was up.  Got better.  Back to baseline breathing.  Currently at baseline breathing.  Dry weight reportedly around 220.  Lasix  recently increased last week at cardiology clinic with weight of 227.  Weight today 223.  She has no complaints.  She had a CT scan last year about 1 year ago that send resolution of prior nodule.  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  Due for 1 year follow-up.  Not yet scheduled.  We discussed this at length.  Questionaires /  Pulmonary Flowsheets:   ACT:      No data to display          MMRC:     No data to display          Epworth:      No data to display          Tests:   FENO:  No results found for: NITRICOXIDE  PFT:    Latest Ref Rng & Units 03/21/2023    3:59 PM 05/28/2021    9:53 AM  PFT Results  FVC-Pre L 1.84  1.63   FVC-Predicted Pre % 55  61   FVC-Post L 1.86  1.71   FVC-Predicted Post % 56  64   Pre FEV1/FVC % % 69  78   Post FEV1/FCV % % 77  81   FEV1-Pre L 1.27  1.27   FEV1-Predicted Pre % 50  61   FEV1-Post L 1.43  1.39   DLCO uncorrected ml/min/mmHg 9.80  9.06   DLCO UNC% % 47  43   DLCO corrected ml/min/mmHg 9.95    DLCO COR %Predicted % 48    DLVA Predicted % 90  78   TLC L 3.68  3.33   TLC % Predicted % 70  63   RV % Predicted % 87  77   Personally reviewed interpreted as spirometry suggestive of air trapping versus restriction.  Lung volumes mildly reduced.  DLCO severely reduced.  WALK:     03/23/2023   11:53 AM 09/04/2021    2:43 PM  SIX MIN WALK  2 Minute Oxygen Saturation %  97 %  2 Minute HR  95  4 Minute Oxygen Saturation %  95 %  4 Minute HR  97  6 Minute Oxygen Saturation %  98 %  6 Minute HR  105  Tech Comments: Pt could only walk one lap due to elevated HR. IG, RMA     Imaging: \Personally reviewed and as per EMR in discussion this note CARDIAC CATHETERIZATION Result Date: 05/03/2024 1. Low filling pressures. 2. Severe pulmonary arterial hypertension. 3. Preserved cardiac output.   ECHOCARDIOGRAM COMPLETE Result Date: 04/29/2024    ECHOCARDIOGRAM REPORT   Patient Name:   Deborah Coffey Date of Exam: 04/29/2024 Medical Rec #:  [id number]          Height:       64.0 in Accession #:    [id number]         Weight:       224.9 lb Date of Birth:  [birth year]         BSA:          2.056 m Patient Age:    68 years           BP:           144/69 mmHg Patient Gender: F                  HR:           89 bpm. Exam Location:  Inpatient Procedure:  2D Echo, Cardiac Doppler, Color Doppler and Intracardiac            Opacification Agent (Both Spectral and Color Flow Doppler were            utilized during procedure). Indications:    Dyspnea R06.00  History:        Patient has prior history of Echocardiogram examinations, most                 recent 01/30/2024. CHF, CAD, Prior CABG, Pulmonary HTN, CKD,                 stage 3 and TIA, Signs/Symptoms:Hypotension and Dyspnea; Risk                 Factors:Hypertension, Diabetes, Sleep Apnea and Current Smoker.  Sonographer:    [name] RCS Referring Phys: [id number] MAURICIO DANIEL ARRIEN IMPRESSIONS  1. Left ventricular ejection fraction, by estimation, is 55 to 60%. The left ventricle has normal function. The left ventricle has no regional wall motion abnormalities. Left ventricular diastolic parameters were normal. There is the interventricular septum is flattened in systole and diastole, consistent with right ventricular pressure and volume overload.  2. Right ventricular systolic function is moderately reduced. The right ventricular size is moderate to severely enlarged. There is severely elevated pulmonary artery systolic pressure. The estimated right ventricular systolic pressure is 82.6 mmHg.  3. Right atrial size was severely dilated.  4. The mitral valve is normal in structure. No evidence of mitral valve regurgitation. No evidence of mitral stenosis.  5. Tricuspid valve regurgitation is severe.  6. The aortic valve is normal in structure. Aortic valve regurgitation is not visualized. No aortic stenosis is present.  7. Pulmonic valve regurgitation is moderate.  8. The inferior vena cava is dilated in size with >50% respiratory variability, suggesting right atrial pressure of 8 mmHg. Comparison(s):  A prior study was performed on 01/30/2024. LVEF 60-65%, RV pressure and volume overload, mild RAE, severe TR, estimated RAP 8 mmHG. FINDINGS  Left Ventricle: Left ventricular ejection fraction, by  estimation, is 55 to 60%. The left ventricle has normal function. The left ventricle has no regional wall motion abnormalities. Definity  contrast agent was given IV to delineate the left ventricular  endocardial borders. The left ventricular internal cavity size was normal in size. There is no left ventricular hypertrophy. The interventricular septum is flattened in systole and diastole, consistent with right ventricular pressure and volume overload. Left ventricular diastolic parameters were normal. Right Ventricle: The right ventricular size is moderate to severely enlarged. Right vetricular wall thickness was not well visualized. Right ventricular systolic function is moderately reduced. There is severely elevated pulmonary artery systolic pressure. The tricuspid regurgitant velocity is 4.32 m/s, and with an assumed right atrial pressure of 8 mmHg, the estimated right ventricular systolic pressure is 82.6 mmHg. Left Atrium: Left atrial size was normal in size. Right Atrium: Right atrial size was severely dilated. Pericardium: There is no evidence of pericardial effusion. Mitral Valve: The mitral valve is normal in structure. No evidence of mitral valve regurgitation. No evidence of mitral valve stenosis. Tricuspid Valve: The tricuspid valve is grossly normal. Tricuspid valve regurgitation is severe. No evidence of tricuspid stenosis. Aortic Valve: The aortic valve is normal in structure. Aortic valve regurgitation is not visualized. No aortic stenosis is present. Aortic valve peak gradient measures 4.0 mmHg. Pulmonic Valve: The pulmonic valve was grossly normal. Pulmonic valve regurgitation is moderate. No evidence of pulmonic stenosis. Aorta: The aortic root and ascending aorta are structurally normal, with no evidence of dilitation. Venous: The inferior vena cava is dilated in size with greater than 50% respiratory variability, suggesting right atrial pressure of 8 mmHg. IAS/Shunts: There is left bowing of the  interatrial septum, suggestive of elevated right atrial pressure. The atrial septum is grossly normal.  LEFT VENTRICLE PLAX 2D LVIDd:         4.70 cm   Diastology LVIDs:         3.00 cm   LV e' medial:    9.08 cm/s LV PW:         0.90 cm   LV E/e' medial:  6.8 LV IVS:        0.90 cm   LV e' lateral:   13.60 cm/s LVOT diam:     2.06 cm   LV E/e' lateral: 4.5 LV SV:         32 LV SV Index:   15 LVOT Area:     3.33 cm  RIGHT VENTRICLE            IVC RV S prime:     6.92 cm/s  IVC diam: 2.57 cm TAPSE (M-mode): 1.0 cm LEFT ATRIUM             Index        RIGHT ATRIUM           Index LA diam:        3.48 cm 1.69 cm/m   RA Area:     27.15 cm LA Vol (A2C):   37.1 ml 18.04 ml/m  RA Volume:   103.40 ml 50.28 ml/m LA Vol (A4C):   31.7 ml 15.42 ml/m LA Biplane Vol: 37.0 ml 17.99 ml/m  AORTIC VALVE                 PULMONIC VALVE AV Area (Vmax):  1.91 cm     PV Vmax:       0.91 m/s AV Vmax:        100.50 cm/s  PV Peak grad:  3.3 mmHg AV Peak Grad:   4.0 mmHg LVOT Vmax:      57.70 cm/s LVOT Vmean:     37.100 cm/s LVOT VTI:       0.095 m  AORTA Ao Root diam: 3.06 cm Ao Asc diam:  3.32 cm MITRAL VALVE               TRICUSPID VALVE MV Area (PHT): 6.37 cm    TR Peak grad:   74.6 mmHg MV Decel Time: 119 msec    TR Vmax:        432.00 cm/s MV E velocity: 61.80 cm/s MV A velocity: 98.50 cm/s  SHUNTS MV E/A ratio:  0.63        Systemic VTI:  0.09 m                            Systemic Diam: 2.06 cm Sunit Tolia Electronically signed by Madonna Large Signature Date/Time: 04/29/2024/12:25:50 PM    Final    DG Chest 2 View Result Date: 04/27/2024 CLINICAL DATA:  Chest pain. EXAM: CHEST - 2 VIEW COMPARISON:  January 28, 2024 FINDINGS: Multiple sternal wires and vascular clips are present. The cardiac silhouette is mildly enlarged and unchanged in size. Both lungs are clear. Multilevel degenerative changes are present throughout the thoracic spine. IMPRESSION: 1. Evidence of prior median sternotomy/CABG. 2. No acute cardiopulmonary  disease. Electronically Signed   By: Suzen Dials M.D.   On: 04/27/2024 20:02    Lab Results: Personally reviewed CBC    Component Value Date/Time   WBC 7.0 05/03/2024 0435   RBC 4.10 05/03/2024 0435   HGB 11.2 (L) 05/03/2024 1009   HGB 11.2 (L) 05/03/2024 1009   HGB 9.3 (L) 01/02/2024 1109   HCT 33.0 (L) 05/03/2024 1009   HCT 33.0 (L) 05/03/2024 1009   HCT 30.3 (L) 01/02/2024 1109   PLT 342 05/03/2024 0435   PLT 325 01/02/2024 1109   MCV 73.2 (L) 05/03/2024 0435   MCV 92 01/02/2024 1109   MCH 21.5 (L) 05/03/2024 0435   MCHC 29.3 (L) 05/03/2024 0435   RDW 21.4 (H) 05/03/2024 0435   RDW 14.3 01/02/2024 1109   LYMPHSABS 1.3 01/28/2024 2107   LYMPHSABS 1.6 01/02/2024 1109   MONOABS 0.5 01/28/2024 2107   EOSABS 0.1 01/28/2024 2107   EOSABS 0.1 01/02/2024 1109   BASOSABS 0.0 01/28/2024 2107   BASOSABS 0.0 01/02/2024 1109    BMET    Component Value Date/Time   NA 134 (L) 05/03/2024 1009   NA 136 05/03/2024 1009   NA 138 01/02/2024 1109   K 3.8 05/03/2024 1009   K 3.8 05/03/2024 1009   CL 90 (L) 05/03/2024 0435   CO2 30 05/03/2024 0435   GLUCOSE 251 (H) 05/03/2024 0435   BUN 55 (H) 05/03/2024 0435   BUN 39 (H) 01/02/2024 1109   CREATININE 1.91 (H) 05/03/2024 0435   CREATININE 1.02 02/27/2015 1236   CALCIUM  9.7 05/03/2024 0435   GFRNONAA 28 (L) 05/03/2024 0435   GFRNONAA 61 02/27/2015 1236   GFRAA 46 (L) 10/04/2019 0940   GFRAA 71 02/27/2015 1236    BNP    Component Value Date/Time   BNP 1,434.1 (H) 03/01/2024 1538    ProBNP    Component Value  Date/Time   PROBNP 12,483.0 (H) 04/27/2024 1935   PROBNP 1,633 (H) 02/27/2021 1453   PROBNP <30.0 11/12/2008 1828    Specialty Problems       Pulmonary Problems   DOE (dyspnea on exertion)   Obstructive sleep apnea   Lung nodule   Respiratory bronchiolitis interstitial lung disease (HCC)   Restrictive lung disease    Allergies  Allergen Reactions   Fish Allergy Other (See Comments)    Cause gout    Porcine (Pork) Protein-Containing Drug Products Other (See Comments)    Causes gout   Ciprofloxacin Rash    Immunization History  Administered Date(s) Administered   INFLUENZA, HIGH DOSE SEASONAL PF 01/02/2024   Influenza,inj,Quad PF,6+ Mos 03/28/2015, 02/18/2019, 04/27/2021, 01/20/2022   Influenza-Unspecified 12/29/2022   PNEUMOCOCCAL CONJUGATE-20 04/27/2021   Pneumococcal Conjugate (Pcv15) 12/29/2022   Pneumococcal Polysaccharide-23 01/31/2018, 06/23/2018   Zoster Recombinant(Shingrix) 06/13/2019, 12/03/2022    Past Medical History:  Diagnosis Date   Allergy    Anemia    CAD in native artery 02/18/2004   Mid LAD lesoin just after Large D1 --> CABG x 1 LIMA-LAD   CAP (community acquired pneumonia) 08/07/2014   CHF (congestive heart failure) (HCC)    Chronic kidney disease, stage 3b (HCC)    Depression    High cholesterol    History of blood transfusion 04/20/2003   related to [facility]    Hypertension    Lung nodules    Mild carotid artery disease    Pulmonary hypertension (HCC)    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 competitive flow   TIA (transient ischemic attack)    Type II diabetes mellitus (HCC)     Tobacco History: Social History   Tobacco Use  Smoking Status Every Day   Current packs/day: 0.25   Average packs/day: 0.5 packs/day for 51.1 years (25.3 ttl pk-yrs)   Types: Cigarettes   Start date: 1975  Smokeless Tobacco Current  Tobacco Comments   10 Cigarettes a day. 08/22/2023   Ready to quit: Not Answered Counseling given: Not Answered Tobacco comments: 10 Cigarettes a day. 08/22/2023   Continue to not smoke  Outpatient Encounter Medications as of 05/10/2024  Medication Sig   acetaminophen  (TYLENOL ) 500 MG tablet Take 1,000 mg by mouth every 6 (six) hours as needed for headache or moderate pain (pain score 4-6).   Alcohol Swabs (B-D SINGLE USE SWABS REGULAR) PADS USE AS DIRECTED   aspirin  EC 81 MG  tablet Take 81 mg by mouth daily.   atorvastatin  (LIPITOR) 10 MG tablet Take 1 tablet (10 mg total) by mouth daily.   CALCIUM -MAGNESIUM -ZINC PO Take 1 tablet by mouth daily.   Continuous Glucose Receiver (DEXCOM G7 RECEIVER) DEVI USE AS DIRECTED   Continuous Glucose Sensor (DEXCOM G7 SENSOR) MISC USE TO CHECK BLOOD GLUCOSE THROUGHOUT THE DAY. CHANGES SENSORS ONCE EVERY 10 DAYS.   diclofenac  Sodium (VOLTAREN ) 1 % GEL Apply 2 g topically daily as needed (muscle pain).   digoxin  (LANOXIN ) 0.125 MG tablet Take 1 tablet (0.125 mg total) by mouth daily.   gabapentin  (NEURONTIN ) 300 MG capsule Take 300 mg by mouth 2 (two) times daily.   insulin  glargine, 2 Unit Dial , (TOUJEO  MAX SOLOSTAR) 300 UNIT/ML Solostar Pen Inject 46 Units into the skin at bedtime. (Patient taking differently: Inject 48 Units into the skin at bedtime.)   metFORMIN  (GLUCOPHAGE -XR) 500 MG 24 hr tablet Take 1,000 mg by mouth 2 (two) times daily.  Multiple Vitamin (MULTIVITAMIN WITH MINERALS) TABS tablet Take 1 tablet by mouth daily.   nitroGLYCERIN  (NITROSTAT ) 0.4 MG SL tablet Place 1 tablet (0.4 mg total) under the tongue every 5 (five) minutes as needed for chest pain.   pantoprazole  (PROTONIX ) 40 MG tablet Take 1 tablet (40 mg total) by mouth 2 (two) times daily.   potassium chloride  SA (KLOR-CON  M) 20 MEQ tablet Take 1 tablet (20 mEq total) by mouth daily.   torsemide  (DEMADEX ) 20 MG tablet Take 2 tablets (40 mg total) by mouth 2 (two) times daily.   vitamin B-12 (CYANOCOBALAMIN ) 50 MCG tablet Take 1,000 mcg by mouth daily.   Vitamin D , Cholecalciferol, 25 MCG (1000 UT) TABS Take 1,000 mcg by mouth daily.   Blood Glucose Monitoring Suppl (ONETOUCH VERIO) w/Device KIT Use as instructed to test blood glucose 3 times daily. (Patient not taking: Reported on 05/10/2024)   DROPLET PEN NEEDLES 31G X 8 MM MISC USE WITH TOUJEO  SOLOSTAR PEN EVERY DAY (Patient not taking: Reported on 05/10/2024)   glucose blood (ONETOUCH VERIO) test strip Use  as instructed to test blood glucose 3 times daily. (Patient not taking: Reported on 05/10/2024)   Lancets (ONETOUCH DELICA PLUS LANCET33G) MISC Use as instructed to test blood glucose 3 times daily. (Patient not taking: Reported on 05/10/2024)   [DISCONTINUED] Continuous Glucose Sensor (DEXCOM G7 SENSOR) MISC Use to check blood glucose throughout the day. Changes sensors once every 10 days.   No facility-administered encounter medications on file as of 05/10/2024.     Review of Systems  Review of Systems  No chest pain exertion.  No orthopnea or PND.  Comprehensive review of systems otherwise negative. Physical Exam  BP 133/68   Pulse (!) 110   Temp 98.1 F (36.7 C) (Oral)   Ht 5' 5 (1.651 m) Comment: per patient  Wt 210 lb 6.4 oz (95.4 kg)   LMP  (LMP Unknown)   SpO2 99% Comment: on RA  BMI 35.01 kg/m   Wt Readings from Last 5 Encounters:  05/10/24 210 lb 6.4 oz (95.4 kg)  05/04/24 209 lb (94.8 kg)  05/03/24 208 lb 4.8 oz (94.5 kg)  03/01/24 222 lb (100.7 kg)  02/16/24 224 lb (101.6 kg)    BMI Readings from Last 5 Encounters:  05/10/24 35.01 kg/m  05/04/24 35.87 kg/m  05/03/24 35.75 kg/m  03/01/24 38.11 kg/m  02/16/24 38.45 kg/m     Physical Exam General: In wheelchair no distress Eyes: No icterus no Neck: No JVP Pulmonary: Clear, no work of breathing Cardiovascular: Regular in rhythm, trace edema at the ankle Abdomen: Nondistended MSK: No synovitis, no joint effusion Neuro: Normal gait, no weakness Psych: Normal mood, full affect   Assessment & Plan:   Pulmonary hypertension: Right heart catheter 11/2023 largely group 1 PAH with significant elevated PVR 10, mean PA pressure 42, wedge of 6, depressed cardiac index 1.9.  Will place on sildenafil , then combo pill Opsynvi .  Repeat hemodynamics 01/2024 with improvement, PVR down to 5.6 and cardiac index from 1.9-3.4.  Much improved.  Subsequently off Semglee  was discontinued.  Unclear exactly why or how.  Was  not taking it when hospitalized recently.  Repeat hemodynamics 04/2024 worse with PVR around 8.  Sounds like plan for sotatercept moving forward.  I agree with this.  I would recommend resumption of pulmonary vasodilators once on stable dose given improvement in hemodynamics.  Patient denies stopping the medication in the past although documentation indicates that this was self discontinued because she  did not feel any better.  That may be true, may not have been symptomatically improved but marked improvement in her hemodynamics may have implications for long-term preservation of the right ventricle.  Tobacco abuse: 25-pack-year history.  Lung RADS 2 12/2023.  Continue yearly CT scans for now.  No follow-ups on file.   Deborah JONELLE Beals, MD 05/10/2024   This appointment required 42 minutes of patient care (this includes precharting, chart review, review of results, face-to-face care, etc.).  "

## 2024-05-11 ENCOUNTER — Telehealth (HOSPITAL_COMMUNITY): Payer: Self-pay | Admitting: *Deleted

## 2024-05-11 ENCOUNTER — Encounter: Payer: Self-pay | Admitting: *Deleted

## 2024-05-11 ENCOUNTER — Ambulatory Visit: Payer: Self-pay | Attending: *Deleted | Admitting: *Deleted

## 2024-05-11 ENCOUNTER — Encounter (HOSPITAL_COMMUNITY): Payer: Self-pay

## 2024-05-11 ENCOUNTER — Ambulatory Visit: Payer: Self-pay | Admitting: *Deleted

## 2024-05-11 ENCOUNTER — Ambulatory Visit (HOSPITAL_COMMUNITY): Admit: 2024-05-11 | Discharge: 2024-05-11 | Disposition: A | Attending: Family Medicine

## 2024-05-11 ENCOUNTER — Ambulatory Visit (HOSPITAL_COMMUNITY): Payer: Self-pay | Admitting: Family Medicine

## 2024-05-11 VITALS — BP 138/60 | HR 111 | Ht 65.0 in | Wt 209.0 lb

## 2024-05-11 VITALS — BP 130/74 | HR 109 | Temp 98.0°F | Ht 65.0 in | Wt 209.2 lb

## 2024-05-11 DIAGNOSIS — G4733 Obstructive sleep apnea (adult) (pediatric): Secondary | ICD-10-CM | POA: Diagnosis not present

## 2024-05-11 DIAGNOSIS — I272 Pulmonary hypertension, unspecified: Secondary | ICD-10-CM

## 2024-05-11 DIAGNOSIS — Z7982 Long term (current) use of aspirin: Secondary | ICD-10-CM | POA: Insufficient documentation

## 2024-05-11 DIAGNOSIS — F17210 Nicotine dependence, cigarettes, uncomplicated: Secondary | ICD-10-CM | POA: Insufficient documentation

## 2024-05-11 DIAGNOSIS — I2510 Atherosclerotic heart disease of native coronary artery without angina pectoris: Secondary | ICD-10-CM | POA: Diagnosis not present

## 2024-05-11 DIAGNOSIS — Z720 Tobacco use: Secondary | ICD-10-CM | POA: Diagnosis not present

## 2024-05-11 DIAGNOSIS — F32A Depression, unspecified: Secondary | ICD-10-CM | POA: Diagnosis not present

## 2024-05-11 DIAGNOSIS — Z79899 Other long term (current) drug therapy: Secondary | ICD-10-CM | POA: Diagnosis not present

## 2024-05-11 DIAGNOSIS — I50812 Chronic right heart failure: Secondary | ICD-10-CM | POA: Diagnosis not present

## 2024-05-11 DIAGNOSIS — I2729 Other secondary pulmonary hypertension: Secondary | ICD-10-CM | POA: Insufficient documentation

## 2024-05-11 DIAGNOSIS — N1832 Chronic kidney disease, stage 3b: Secondary | ICD-10-CM | POA: Diagnosis not present

## 2024-05-11 DIAGNOSIS — E118 Type 2 diabetes mellitus with unspecified complications: Secondary | ICD-10-CM

## 2024-05-11 DIAGNOSIS — E785 Hyperlipidemia, unspecified: Secondary | ICD-10-CM | POA: Insufficient documentation

## 2024-05-11 DIAGNOSIS — N183 Chronic kidney disease, stage 3 unspecified: Secondary | ICD-10-CM | POA: Diagnosis not present

## 2024-05-11 DIAGNOSIS — I130 Hypertensive heart and chronic kidney disease with heart failure and stage 1 through stage 4 chronic kidney disease, or unspecified chronic kidney disease: Secondary | ICD-10-CM | POA: Diagnosis present

## 2024-05-11 DIAGNOSIS — Z6834 Body mass index (BMI) 34.0-34.9, adult: Secondary | ICD-10-CM | POA: Diagnosis not present

## 2024-05-11 DIAGNOSIS — E1122 Type 2 diabetes mellitus with diabetic chronic kidney disease: Secondary | ICD-10-CM | POA: Diagnosis not present

## 2024-05-11 DIAGNOSIS — Z794 Long term (current) use of insulin: Secondary | ICD-10-CM | POA: Insufficient documentation

## 2024-05-11 DIAGNOSIS — Z951 Presence of aortocoronary bypass graft: Secondary | ICD-10-CM | POA: Diagnosis not present

## 2024-05-11 DIAGNOSIS — I5032 Chronic diastolic (congestive) heart failure: Secondary | ICD-10-CM | POA: Insufficient documentation

## 2024-05-11 DIAGNOSIS — Z7984 Long term (current) use of oral hypoglycemic drugs: Secondary | ICD-10-CM | POA: Diagnosis not present

## 2024-05-11 DIAGNOSIS — I2781 Cor pulmonale (chronic): Secondary | ICD-10-CM | POA: Insufficient documentation

## 2024-05-11 DIAGNOSIS — M109 Gout, unspecified: Secondary | ICD-10-CM

## 2024-05-11 DIAGNOSIS — I10 Essential (primary) hypertension: Secondary | ICD-10-CM | POA: Diagnosis not present

## 2024-05-11 DIAGNOSIS — I50810 Right heart failure, unspecified: Secondary | ICD-10-CM | POA: Diagnosis not present

## 2024-05-11 DIAGNOSIS — D631 Anemia in chronic kidney disease: Secondary | ICD-10-CM | POA: Diagnosis not present

## 2024-05-11 DIAGNOSIS — E1159 Type 2 diabetes mellitus with other circulatory complications: Secondary | ICD-10-CM

## 2024-05-11 LAB — CBC
HCT: 36.3 % (ref 36.0–46.0)
Hemoglobin: 10.5 g/dL — ABNORMAL LOW (ref 12.0–15.0)
MCH: 21.6 pg — ABNORMAL LOW (ref 26.0–34.0)
MCHC: 28.9 g/dL — ABNORMAL LOW (ref 30.0–36.0)
MCV: 74.5 fL — ABNORMAL LOW (ref 80.0–100.0)
Platelets: 494 K/uL — ABNORMAL HIGH (ref 150–400)
RBC: 4.87 MIL/uL (ref 3.87–5.11)
RDW: 22.2 % — ABNORMAL HIGH (ref 11.5–15.5)
WBC: 8.3 K/uL (ref 4.0–10.5)
nRBC: 0 % (ref 0.0–0.2)

## 2024-05-11 LAB — BASIC METABOLIC PANEL WITH GFR
Anion gap: 14 (ref 5–15)
BUN: 64 mg/dL — ABNORMAL HIGH (ref 8–23)
CO2: 29 mmol/L (ref 22–32)
Calcium: 9.9 mg/dL (ref 8.9–10.3)
Chloride: 93 mmol/L — ABNORMAL LOW (ref 98–111)
Creatinine, Ser: 2 mg/dL — ABNORMAL HIGH (ref 0.44–1.00)
GFR, Estimated: 27 mL/min — ABNORMAL LOW
Glucose, Bld: 323 mg/dL — ABNORMAL HIGH (ref 70–99)
Potassium: 4.7 mmol/L (ref 3.5–5.1)
Sodium: 136 mmol/L (ref 135–145)

## 2024-05-11 LAB — POCT GLYCOSYLATED HEMOGLOBIN (HGB A1C): HbA1c, POC (controlled diabetic range): 8.8 % — AB (ref 0.0–7.0)

## 2024-05-11 LAB — PRO BRAIN NATRIURETIC PEPTIDE: Pro Brain Natriuretic Peptide: 12768 pg/mL — ABNORMAL HIGH

## 2024-05-11 LAB — DIGOXIN LEVEL: Digoxin Level: 1.5 ng/mL (ref 0.8–2.0)

## 2024-05-11 LAB — GLUCOSE, POCT (MANUAL RESULT ENTRY): POC Glucose: 323 mg/dL — AB (ref 70–99)

## 2024-05-11 MED ORDER — DIGOXIN 62.5 MCG PO TABS: 0.0625 mg | ORAL_TABLET | Freq: Every day | ORAL | 3 refills | Status: DC

## 2024-05-11 MED ORDER — TOUJEO MAX SOLOSTAR 300 UNIT/ML ~~LOC~~ SOPN: 53.0000 [IU] | PEN_INJECTOR | Freq: Every day | SUBCUTANEOUS | 3 refills | Status: AC

## 2024-05-11 MED ORDER — COLCHICINE 0.6 MG PO TABS: 0.6000 mg | ORAL_TABLET | Freq: Every day | ORAL | 0 refills | Status: AC

## 2024-05-11 NOTE — Assessment & Plan Note (Addendum)
 As above she has history of diabetes mellitus and takes metformin  and glargine as previously prescribed.  She denies symptoms of concern such as polyuria, polyphagia or polydipsia. Point-of-care glucose was 323 with an A1c of 8.8. I encouraged her to continue her metformin  and increase her glargine to 53 units at night. She needs to adhere to a low carbohydrate diet and exercise daily Orders:   POCT glucose (manual entry)   POCT glycosylated hemoglobin (Hb A1C)

## 2024-05-11 NOTE — Assessment & Plan Note (Addendum)
 As above she reports history of gout.  Says she always has a painful hot red swollen joint. No injury to this area no fever or chills.  No rash. She cannot tolerate anti-inflammatories due to kidney disease and should not take steroids due to diabetes that is poorly controlled. Will try a round of colchicine  Orders:   colchicine  0.6 MG tablet; Take 1 tablet (0.6 mg total) by mouth daily.

## 2024-05-11 NOTE — Telephone Encounter (Signed)
 Contacted patient who confirmed she has not heard from [employer] or [employer]process [employer]ist. Spoke with representative at [employer] who is re-processing the prescription to [facility] and will reach out to [facility] to see if there are any concerns causing the delay. Will continue to follow up.

## 2024-05-11 NOTE — Patient Instructions (Signed)
 Today we talked about your recent hospitalization for acute on chronic diastolic congestive heart failure. You feel fairly well following that hospitalization. We checked point-of-care glucose and A1c.  Your glucose was 323 with a A1c of 8.8 you are encouraged to increase your insulin  to 53 units once a day. Will recheck your A1c at your next clinic visit. You also reported a flare of gout in your left ankle. Due to your kidney function you cannot take anti-inflammatories and with your blood sugar being so high it would not be wise to use steroid so we will use colchicine . That prescription will be sent to the pharmacy of your choice. You did ask for a referral back to the nephrologist that you missed all of those appointments due to lack of transportation. I will make that referral

## 2024-05-11 NOTE — Progress Notes (Signed)
 "   Patient ID: Deborah Coffey, female    DOB: [birth year]  MRN: [id number]  CC: Hospitalization Follow-up (Left leg pain, and swelling X2 MONTHS.)   Subjective: Deborah Coffey is a 69 y.o. female who presents for hospital follow-up She was admitted 04/27/2024 and discharged 05/03/2024 for acute on chronic diastolic CHF.  Pulmonary hypertension.  Cor pulmonale/acute RV failure.  CAD with CABG x 1 (LIMA-LAD).  essential hypertension.  Chronic kidney disease stage IIIb.  Type 2 diabetes mellitus with hyperlipidemia.  History of TIA and stroke.  Depression.  Obesity, class II.    Prior to hospitalization she had noticed increased edema and shortness of breath on exertion.  She was felt to be fluid overloaded and hospitalized for further management.  Acute diastolic CHF/acute RV failure/cor pulmonale/pulmonary hypertension: Recent Echocardiogram with preserved the LF systolic function with EF 50 to 65%.  Severe tricuspid regurg.  Moderate pulmonic regurg.  During hospitalization, she was diuresed and underwent right heart catheterization.  She was switched back to oral diuretics and remains on dig.  She does not have any complaint of edema or shortness of breath on exertion at this time.  CAD with CABG x 1 (LIMA-LAD):  During hospitalization, High sensitive troponin elevation due to heart failure and volume overload and no acute coronary syndrome.  She denies chest pain, shortness of breath, wheezing today.  Essential hypertension:  Her Blood pressure is stable at 130/70 today without complaint of headache, blurred vision, ringing or buzzing in ears, chest pain, palpitations, shortness of breath or symptom into an extremity that to her would suggest a stroke  Chronic kidney disease, stage IIIb: Her baseline creatinine between 1.5 and 2.  Stable for the most part during hospitalization.   She reports today that she has not been able to keep her appointment with the nephrologist and  would like another referral  Diabetes mellitus, type II uncontrolled with hyperglycemia: she remains on metformin  and glargine (43 units at night) at home.  Last A1c August 2025 was 6.1. She denies polyuria, polyphasia and polydipsia. Point of contact glucose and A1c was run today with the results being glucose 223 and A1c of 8.8  Obesity:  Her body mass index was estimated at 35.75 while in the hospital today it is 34.78  Gout, left ankle:  She has history of gout.  She reports she always has a joint that is painful ,hot and swollen... She cannot take anti-inflammatories due to her kidney function and steroids elevate her blood sugar. She saw her cardiology provider and they suggested discussing colchicine  as an option.  She denies injury to the ankle.  No fever or rash.  Pertinent past medical history include: Acute diastolic CHF/acute RV failure/cor pulmonale/pulmonary hypertension, CAD with CABG x 1 (LIMA-LAD), essential hypertension, chronic kidney disease stage IIIb, diabetes mellitus type 2 uncontrolled with hyperglycemia, obesity (BMI 34.78), gout flare    Patient Active Problem List   Diagnosis Date Noted   Acute exacerbation of CHF (congestive heart failure) (HCC) 04/27/2024   Urinary urgency 02/02/2024   Hypotension 01/29/2024   Elevated troponin 01/29/2024   History of CAD (coronary artery disease) 01/29/2024   Postural dizziness with presyncope 01/29/2024   History of depression 01/29/2024   Continuous dependence on cigarette smoking 01/29/2024   Insulin  dependent diabetes mellitus type IA (HCC) 01/29/2024   Chronic kidney disease, stage 3b (HCC) 01/29/2024   Type B lactic acidosis 01/29/2024   Other fatigue 12/15/2023   Acute on chronic systolic congestive heart  failure (HCC) 12/15/2023   Acute on chronic diastolic CHF (congestive heart failure) (HCC) 12/14/2023   Anemia 12/11/2023   Restrictive lung disease 08/22/2023   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   Lung nodule 03/25/2023   Gout 05/03/2022   Obesity, class 2 05/03/2022   Atherosclerotic heart disease of native coronary artery with other forms of angina pectoris 05/03/2022   Obstructive sleep apnea 08/31/2021   Pulmonary hypertension (HCC) 04/10/2021   Facial numbness 09/03/2016   Chronic diastolic CHF (congestive heart failure) (HCC) 09/03/2016   Anxiety    Eczema 01/29/2016   History of adenomatous polyp of colon 10/01/2015   Floaters in visual field 09/04/2015   DOE (dyspnea on exertion) 06/26/2015   Screening for colon cancer 06/26/2015   Type 2 diabetes mellitus with complication, with long-term current use of insulin  (HCC) 02/27/2015   Diabetic mononeuropathy associated with type 2 diabetes mellitus (HCC) 02/27/2015   LLQ abdominal pain    AKI (acute kidney injury) 08/07/2014   Type 2 diabetes mellitus with hyperlipidemia (HCC) 07/08/2014   Preventative health care 07/08/2014   Depression 07/08/2014   Essential hypertension 01/10/2014   Depression 01/10/2014   H/O TIA (transient ischemic attack) and stroke 01/10/2014   Visual changes 01/02/2014   Facial weakness 01/02/2014   Conversion disorder 01/01/2014   Tobacco use 06/20/2013   Diabetic retinopathy (HCC) 04/03/2013   Chest pressure 03/22/2013   Essential hypertension, benign 02/14/2013   Accelerated hypertension 09/19/2012   Uncontrolled diabetes mellitus 09/19/2012   Hyperlipidemia 09/19/2012   CAD (coronary artery disease) - CABG x 1 (LIMA-LAD) 02/21/2004     Medications Ordered Prior to Encounter[1]  Allergies[2]  Social History   Socioeconomic History   Marital status: Divorced    Spouse name: Not on file   Number of children: Not on file   Years of education: 12   Highest education level: Some college, no degree  Occupational History   Occupation: Disabled  Tobacco Use   Smoking status: Every Day    Current packs/day: 0.25    Average packs/day: 0.5 packs/day for  51.1 years (25.3 ttl pk-yrs)    Types: Cigarettes    Start date: 1975   Smokeless tobacco: Current   Tobacco comments:    10 Cigarettes a day. 08/22/2023  Vaping Use   Vaping status: Some Days   Substances: Flavoring  Substance and Sexual Activity   Alcohol use: Not Currently   Drug use: No    Types: Marijuana    Comment: smoked pot years ago but not currently   Sexual activity: Not Currently  Other Topics Concern   Not on file  Social History Narrative   Lives with daughter and 3 grandchildren   Social Drivers of Health   Tobacco Use: High Risk (05/11/2024)   Patient History    Smoking Tobacco Use: Every Day    Smokeless Tobacco Use: Current    Passive Exposure: Not on file  Financial Resource Strain: Low Risk (08/23/2023)   Overall Financial Resource Strain (CARDIA)    Difficulty of Paying Living Expenses: Not hard at all  Food Insecurity: No Food Insecurity (05/04/2024)   Epic    Worried About Radiation Protection Practitioner of Food in the Last Year: Never true    Ran Out of Food in the Last Year: Never true  Transportation Needs: Unmet Transportation Needs (05/04/2024)   Epic    Lack of Transportation (Medical): Yes    Lack of Transportation (Non-Medical): Yes  Physical Activity: Inactive (08/23/2023)  Exercise Vital Sign    Days of Exercise per Week: 0 days    Minutes of Exercise per Session: 0 min  Stress: No Stress Concern Present (08/23/2023)   [employer] of Occupational Health - Occupational Stress Questionnaire    Feeling of Stress : Not at all  Social Connections: Moderately Isolated (04/28/2024)   Social Connection and Isolation Panel    Frequency of Communication with Friends and Family: Once a week    Frequency of Social Gatherings with Friends and Family: Once a week    Attends Religious Services: 1 to 4 times per year    Active Member of [employer] or Organizations: Yes    Attends Banker Meetings: 1 to 4 times per year    Marital Status: Widowed  Intimate Partner  Violence: Not At Risk (05/04/2024)   Epic    Fear of Current or Ex-Partner: No    Emotionally Abused: No    Physically Abused: No    Sexually Abused: No  Depression (PHQ2-9): Low Risk (05/04/2024)   Depression (PHQ2-9)    PHQ-2 Score: 0  Alcohol Screen: Low Risk (08/23/2023)   Alcohol Screen    Last Alcohol Screening Score (AUDIT): 0  Housing: Unknown (05/04/2024)   Epic    Unable to Pay for Housing in the Last Year: No    Number of Times Moved in the Last Year: Not on file    Homeless in the Last Year: No  Utilities: Not At Risk (05/04/2024)   Epic    Threatened with loss of utilities: No  Health Literacy: Adequate Health Literacy (08/23/2023)   B1300 Health Literacy    Frequency of need for help with medical instructions: Never    Family History  Problem Relation Age of Onset   Heart disease Mother    Diabetes Mother    Cancer Mother        cervical   Diabetes Father    Diabetes Sister    Heart disease Sister    Lupus Sister    Heart failure Sister    Hepatitis C Sister    Hypertension Daughter    Diabetes Brother    Heart attack Brother    Heart failure Brother    Diabetes Brother    Colon cancer Neg Hx    Breast cancer Neg Hx     Past Surgical History:  Procedure Laterality Date   CARDIAC CATHETERIZATION  2010   Previous LAD 95% lesion - now ~30-40%; patent LIMA with competitive flow   CESAREAN SECTION  1977; 1989   CHOLECYSTECTOMY  ~ 2012   CORONARY ARTERY BYPASS GRAFT  02/2004   CABG X1 (03/22/2013); LIMA-LAD   RIGHT HEART CATH N/A 07/14/2023   Procedure: RIGHT HEART CATH;  Surgeon: Cherrie Toribio SAUNDERS, MD;  Location: [facility] INVASIVE CV LAB;  Service: Cardiovascular;  Laterality: N/A;   RIGHT HEART CATH N/A 02/16/2024   Procedure: RIGHT HEART CATH;  Surgeon: Cherrie Toribio SAUNDERS, MD;  Location: [facility] INVASIVE CV LAB;  Service: Cardiovascular;  Laterality: N/A;   RIGHT HEART CATH N/A 05/03/2024   Procedure: RIGHT HEART CATH;  Surgeon: Rolan Ezra RAMAN, MD;  Location: [facility]  INVASIVE CV LAB;  Service: Cardiovascular;  Laterality: N/A;   RIGHT/LEFT HEART CATH AND CORONARY/GRAFT ANGIOGRAPHY N/A 04/16/2021   Procedure: RIGHT/LEFT HEART CATH AND CORONARY/GRAFT ANGIOGRAPHY;  Surgeon: Dann Candyce RAMAN, MD;  Location: [facility] INVASIVE CV LAB;  Service: Cardiovascular;  Laterality: N/A;   TUBAL LIGATION  1989    ROS: Review of  Systems Negative except as stated above  PHYSICAL EXAM: BP 130/74 (BP Location: Left Arm, Patient Position: Sitting, Cuff Size: Normal)   Pulse (!) 109   Temp 98 F (36.7 C) (Oral)   Ht 5' 5 (1.651 m)   Wt 94.9 kg   LMP  (LMP Unknown)   SpO2 99%   BMI 34.81 kg/m   Physical Exam Vitals and nursing note reviewed.  Cardiovascular:     Rate and Rhythm: Normal rate and regular rhythm.  Pulmonary:     Effort: Pulmonary effort is normal.     Breath sounds: Normal breath sounds.  Abdominal:     General: Abdomen is flat.     Palpations: Abdomen is soft.  Musculoskeletal:     Comments: Pain to palpation over the left medial malleolus with warmth and swelling  Skin:    General: Skin is warm and dry.  Neurological:     Mental Status: She is alert. Mental status is at baseline.          Latest Ref Rng & Units 05/11/2024   10:12 AM 05/03/2024   10:09 AM 05/03/2024    4:35 AM  CMP  Glucose 70 - 99 mg/dL 676   748   BUN 8 - 23 mg/dL 64   55   Creatinine 9.55 - 1.00 mg/dL 7.99   8.08   Sodium 864 - 145 mmol/L 136  134    136  132   Potassium 3.5 - 5.1 mmol/L 4.7  3.8    3.8  3.9   Chloride 98 - 111 mmol/L 93   90   CO2 22 - 32 mmol/L 29   30   Calcium  8.9 - 10.3 mg/dL 9.9   9.7    Lipid Panel     Component Value Date/Time   CHOL 161 01/21/2021 1618   TRIG 104 01/21/2021 1618   HDL 72 01/21/2021 1618   CHOLHDL 2.2 01/21/2021 1618   CHOLHDL 2.2 09/02/2016 0533   VLDL 13 09/02/2016 0533   LDLCALC 70 01/21/2021 1618    CBC    Component Value Date/Time   WBC 8.3 05/11/2024 1012   RBC 4.87 05/11/2024 1012   HGB 10.5 (L)  05/11/2024 1012   HGB 9.3 (L) 01/02/2024 1109   HCT 36.3 05/11/2024 1012   HCT 30.3 (L) 01/02/2024 1109   PLT 494 (H) 05/11/2024 1012   PLT 325 01/02/2024 1109   MCV 74.5 (L) 05/11/2024 1012   MCV 92 01/02/2024 1109   MCH 21.6 (L) 05/11/2024 1012   MCHC 28.9 (L) 05/11/2024 1012   RDW 22.2 (H) 05/11/2024 1012   RDW 14.3 01/02/2024 1109   LYMPHSABS 1.3 01/28/2024 2107   LYMPHSABS 1.6 01/02/2024 1109   MONOABS 0.5 01/28/2024 2107   EOSABS 0.1 01/28/2024 2107   EOSABS 0.1 01/02/2024 1109   BASOSABS 0.0 01/28/2024 2107   BASOSABS 0.0 01/02/2024 1109    Results for orders placed or performed in visit on 05/11/24  POCT glucose (manual entry)   Collection Time: 05/11/24  3:52 PM  Result Value Ref Range   POC Glucose 323 (A) 70 - 99 mg/dl  POCT glycosylated hemoglobin (Hb A1C)   Collection Time: 05/11/24  3:52 PM  Result Value Ref Range   Hemoglobin A1C     HbA1c POC (<> result, manual entry)     HbA1c, POC (prediabetic range)     HbA1c, POC (controlled diabetic range) 8.8 (A) 0.0 - 7.0 %     ASSESSMENT AND PLAN:  Assessment & Plan Type 2 diabetes mellitus with complication, with long-term current use of insulin  (HCC) As above she has history of diabetes mellitus and takes metformin  and glargine as previously prescribed.  She denies symptoms of concern such as polyuria, polyphagia or polydipsia. Point-of-care glucose was 323 with an A1c of 8.8. I encouraged her to continue her metformin  and increase her glargine to 53 units at night. She needs to adhere to a low carbohydrate diet and exercise daily Orders:   POCT glucose (manual entry)   POCT glycosylated hemoglobin (Hb A1C)  Acute gout of left ankle, unspecified cause As above she reports history of gout.  Says she always has a painful hot red swollen joint. No injury to this area no fever or chills.  No rash. She cannot tolerate anti-inflammatories due to kidney disease and should not take steroids due to diabetes that is  poorly controlled. Will try a round of colchicine  Orders:   colchicine  0.6 MG tablet; Take 1 tablet (0.6 mg total) by mouth daily.  Stage 3b chronic kidney disease (HCC) As above she has history of stage III chronic kidney disease.  She has been referred to the nephrologist numerous times but has not been able to keep the appointment due to transportation. She reports she has transportation now so we  referred her back to the nephrologist Orders:   Ambulatory referral to Nephrology       Patient was given the opportunity to ask questions.  Patient verbalized understanding of the plan and was able to repeat key elements of the plan.   This documentation was completed using Paediatric nurse.  Any transcriptional errors are unintentional.     Requested Prescriptions   Signed Prescriptions Disp Refills   insulin  glargine, 2 Unit Dial , (TOUJEO  MAX SOLOSTAR) 300 UNIT/ML Solostar Pen 18 mL 3    Sig: Inject 53 Units into the skin at bedtime.   colchicine  0.6 MG tablet 30 tablet 0    Sig: Take 1 tablet (0.6 mg total) by mouth daily.    No follow-ups on file.  Lovell Nuttall H, NP      [1]  Current Outpatient Medications on File Prior to Visit  Medication Sig Dispense Refill   acetaminophen  (TYLENOL ) 500 MG tablet Take 1,000 mg by mouth every 6 (six) hours as needed for headache or moderate pain (pain score 4-6).     Alcohol Swabs (B-D SINGLE USE SWABS REGULAR) PADS USE AS DIRECTED 100 each 0   aspirin  EC 81 MG tablet Take 81 mg by mouth daily.     atorvastatin  (LIPITOR) 10 MG tablet Take 1 tablet (10 mg total) by mouth daily. 90 tablet 1   Blood Glucose Monitoring Suppl (ONETOUCH VERIO) w/Device KIT Use as instructed to test blood glucose 3 times daily. 1 kit 0   CALCIUM -MAGNESIUM -ZINC PO Take 1 tablet by mouth daily.     Continuous Glucose Receiver (DEXCOM G7 RECEIVER) DEVI USE AS DIRECTED 1 each 3   Continuous Glucose Sensor (DEXCOM G7 SENSOR) MISC USE TO CHECK  BLOOD GLUCOSE THROUGHOUT THE DAY. CHANGES SENSORS ONCE EVERY 10 DAYS. 9 each 3   diclofenac  Sodium (VOLTAREN ) 1 % GEL Apply 2 g topically daily as needed (muscle pain). 150 g 1   DROPLET PEN NEEDLES 31G X 8 MM MISC USE WITH TOUJEO  SOLOSTAR PEN EVERY DAY 100 each 3   gabapentin  (NEURONTIN ) 300 MG capsule Take 300 mg by mouth 2 (two) times daily.     glucose blood (ONETOUCH VERIO) test  strip Use as instructed to test blood glucose 3 times daily. 100 each 3   Lancets (ONETOUCH DELICA PLUS LANCET33G) MISC Use as instructed to test blood glucose 3 times daily. 100 each 3   metFORMIN  (GLUCOPHAGE -XR) 500 MG 24 hr tablet Take 1,000 mg by mouth 2 (two) times daily.     Multiple Vitamin (MULTIVITAMIN WITH MINERALS) TABS tablet Take 1 tablet by mouth daily.     nitroGLYCERIN  (NITROSTAT ) 0.4 MG SL tablet Place 1 tablet (0.4 mg total) under the tongue every 5 (five) minutes as needed for chest pain. 25 tablet 0   pantoprazole  (PROTONIX ) 40 MG tablet Take 1 tablet (40 mg total) by mouth 2 (two) times daily. 180 tablet 1   potassium chloride  SA (KLOR-CON  M) 20 MEQ tablet Take 1 tablet (20 mEq total) by mouth daily. 60 tablet 0   torsemide  (DEMADEX ) 20 MG tablet Take 2 tablets (40 mg total) by mouth 2 (two) times daily. 240 tablet 0   vitamin B-12 (CYANOCOBALAMIN ) 50 MCG tablet Take 1,000 mcg by mouth daily.     Vitamin D , Cholecalciferol, 25 MCG (1000 UT) TABS Take 1,000 mcg by mouth daily.     No current facility-administered medications on file prior to visit.  [2]  Allergies Allergen Reactions   Fish Allergy Other (See Comments)    Cause gout   Porcine (Pork) Protein-Containing Drug Products Other (See Comments)    Causes gout   Ciprofloxacin Rash   "

## 2024-05-11 NOTE — Patient Instructions (Signed)
 Medication Changes:  None, continue current medications   Lab Work:  Labs done today, your results will be available in MyChart, we will contact you for abnormal readings.   Special Instructions // Education:  Do the following things EVERYDAY: Weigh yourself in the morning before breakfast. Write it down and keep it in a log. Take your medicines as prescribed Eat low salt foods--Limit salt (sodium) to 2000 mg per day.  Stay as active as you can everyday Limit all fluids for the day to less than 2 liters   Follow-Up in: 3 months   At the [facility], you and your health needs are our priority. We have a designated team specialized in the treatment of Heart Failure. This Care Team includes your primary Heart Failure Specialized Cardiologist (physician), Advanced Practice Providers (APPs- Physician Assistants and Nurse Practitioners), and Pharmacist who all work together to provide you with the care you need, when you need it.   You may see any of the following providers on your designated Care Team at your next follow up:  Dr. Toribio Fuel Dr. Ezra Shuck Dr. Odis Brownie Greig Mosses, NP Caffie Shed, [place] [facility] [place], [place] Beckey Coe, NP Jordan Lee, NP Tinnie Redman, PharmD   Please be sure to bring in all your medications bottles to every appointment.   Need to Contact Us :  If you have any questions or concerns before your next appointment please send us  a message through [place] or call our office at [phone].    TO LEAVE A MESSAGE FOR THE NURSE SELECT OPTION 2, PLEASE LEAVE A MESSAGE INCLUDING: YOUR NAME DATE OF BIRTH CALL BACK NUMBER REASON FOR CALL**this is important as we prioritize the call backs  YOU WILL RECEIVE A CALL BACK THE SAME DAY AS LONG AS YOU CALL BEFORE 4:00 PM

## 2024-05-11 NOTE — Telephone Encounter (Signed)
 Called patient per Harlene Gainer, NP with following:  Digoxin  elevated. She said this was a trough. Please call and confirm that she did not take digoxin  today before her labs were drawn.  If this was truly a dig trough, Please reduce dose to 0.0625 mg daily and repeat digoxin  trough in 1-2 weeks  Pt confirmed she did not take digoxin  this morning. New dose Rx sent, repeat labs ordered and scheduled. Pt verbalized understanding of same.

## 2024-05-14 ENCOUNTER — Telehealth: Payer: Self-pay | Admitting: Family Medicine

## 2024-05-14 ENCOUNTER — Ambulatory Visit: Payer: Self-pay | Admitting: Family Medicine

## 2024-05-14 ENCOUNTER — Ambulatory Visit: Payer: Self-pay

## 2024-05-14 NOTE — Telephone Encounter (Signed)
 Contacted patient and left voicemail requesting to change todays appointment to virtual or to reschedule. No in-person appointments today due to weather. If patient calls back, please switch appointment accordingly.

## 2024-05-14 NOTE — Telephone Encounter (Addendum)
. °  Message from [place] E sent at 05/14/2024  1:56 PM EST  Summary: feeling woozy in her head   Reason for Triage: feeling woozy in her head blood sugar issues          Reason for Disposition  [1] Symptoms of high blood sugar (e.g., increased thirst, frequent urination, weight loss) AND [2] not able to test blood glucose  Answer Assessment - Initial Assessment Questions Has not yet received dexcom in the mail  1. BLOOD GLUCOSE: What is your blood glucose level?      I think my blood sugar is running high today 2. ONSET: When did you check the blood glucose?     Not monitored 3. USUAL RANGE: What is your glucose level usually? (e.g., usual fasting morning value, usual evening value)     Unsure, not monitored but mentions high A1C  6. INSULIN : Do you take insulin ? What type of insulin (s) do you use? What is the mode of delivery? (syringe, pen; injection or pump)?      Toujeo - no missed doses 7. DIABETES PILLS: Do you take any pills for your diabetes? If Yes, ask: Have you missed taking any pills recently?     Metformin - no missed doses 8. OTHER SYMPTOMS: Do you have any symptoms? (e.g., fever, frequent urination, difficulty breathing, dizziness, weakness, vomiting) I'm feeling kind of woozy in my head  Protocols used: Diabetes - High Blood Sugar-A-AH

## 2024-05-14 NOTE — Telephone Encounter (Signed)
 2nd attempt contacted pt left vm

## 2024-05-15 ENCOUNTER — Encounter (HOSPITAL_COMMUNITY)

## 2024-05-17 ENCOUNTER — Encounter (HOSPITAL_COMMUNITY)

## 2024-05-18 NOTE — Telephone Encounter (Signed)
 Attempted to contact patient for follow up, no answer. Will try back

## 2024-05-19 ENCOUNTER — Other Ambulatory Visit: Payer: Self-pay | Admitting: Family Medicine

## 2024-05-19 DIAGNOSIS — Z794 Long term (current) use of insulin: Secondary | ICD-10-CM

## 2024-05-21 NOTE — Telephone Encounter (Signed)
 Attempted to contact patient for follow up, no answer. Will try back

## 2024-05-22 ENCOUNTER — Encounter (HOSPITAL_COMMUNITY)

## 2024-05-24 ENCOUNTER — Encounter (HOSPITAL_COMMUNITY)

## 2024-05-25 ENCOUNTER — Ambulatory Visit (HOSPITAL_COMMUNITY): Admission: RE | Admit: 2024-05-25 | Source: Ambulatory Visit

## 2024-05-25 ENCOUNTER — Other Ambulatory Visit (HOSPITAL_COMMUNITY): Payer: Self-pay

## 2024-05-25 ENCOUNTER — Ambulatory Visit (HOSPITAL_COMMUNITY): Payer: Self-pay | Admitting: Family Medicine

## 2024-05-25 DIAGNOSIS — I5032 Chronic diastolic (congestive) heart failure: Secondary | ICD-10-CM

## 2024-05-25 LAB — DIGOXIN LEVEL: Digoxin Level: 1.1 ng/mL (ref 0.8–2.0)

## 2024-05-25 NOTE — Telephone Encounter (Signed)
 Spoke to [facility], technician confirmed medication is scheduled for delivery on 02/11

## 2024-05-29 ENCOUNTER — Encounter (HOSPITAL_COMMUNITY)

## 2024-05-31 ENCOUNTER — Encounter (HOSPITAL_COMMUNITY)

## 2024-06-05 ENCOUNTER — Encounter (HOSPITAL_COMMUNITY)

## 2024-06-07 ENCOUNTER — Encounter (HOSPITAL_COMMUNITY)

## 2024-06-12 ENCOUNTER — Encounter (HOSPITAL_COMMUNITY)

## 2024-06-14 ENCOUNTER — Encounter (HOSPITAL_COMMUNITY)

## 2024-06-19 ENCOUNTER — Encounter (HOSPITAL_COMMUNITY)

## 2024-06-26 ENCOUNTER — Ambulatory Visit: Payer: Self-pay | Admitting: Family Medicine

## 2024-08-02 ENCOUNTER — Ambulatory Visit (HOSPITAL_COMMUNITY): Admitting: Internal Medicine

## 2024-08-28 ENCOUNTER — Ambulatory Visit

## 2024-09-20 ENCOUNTER — Ambulatory Visit: Admitting: Pulmonary Disease
